# Patient Record
Sex: Male | Born: 1940 | Race: White | Hispanic: No | Marital: Married | Smoking: Never smoker
Health system: Southern US, Community
[De-identification: ages and names within clinical notes are randomized; demographics above are authoritative.]

## PROBLEM LIST (undated history)

## (undated) DIAGNOSIS — G8929 Other chronic pain: Secondary | ICD-10-CM

## (undated) DIAGNOSIS — Z8719 Personal history of other diseases of the digestive system: Secondary | ICD-10-CM

## (undated) DIAGNOSIS — C189 Malignant neoplasm of colon, unspecified: Secondary | ICD-10-CM

## (undated) DIAGNOSIS — E1122 Type 2 diabetes mellitus with diabetic chronic kidney disease: Secondary | ICD-10-CM

## (undated) DIAGNOSIS — I214 Non-ST elevation (NSTEMI) myocardial infarction: Secondary | ICD-10-CM

## (undated) DIAGNOSIS — Z992 Dependence on renal dialysis: Secondary | ICD-10-CM

## (undated) DIAGNOSIS — M545 Low back pain, unspecified: Secondary | ICD-10-CM

## (undated) DIAGNOSIS — R011 Cardiac murmur, unspecified: Secondary | ICD-10-CM

## (undated) DIAGNOSIS — I255 Ischemic cardiomyopathy: Secondary | ICD-10-CM

## (undated) DIAGNOSIS — N400 Enlarged prostate without lower urinary tract symptoms: Secondary | ICD-10-CM

## (undated) DIAGNOSIS — I779 Disorder of arteries and arterioles, unspecified: Secondary | ICD-10-CM

## (undated) DIAGNOSIS — I10 Essential (primary) hypertension: Secondary | ICD-10-CM

## (undated) DIAGNOSIS — E039 Hypothyroidism, unspecified: Secondary | ICD-10-CM

## (undated) DIAGNOSIS — Z789 Other specified health status: Secondary | ICD-10-CM

## (undated) DIAGNOSIS — I447 Left bundle-branch block, unspecified: Secondary | ICD-10-CM

## (undated) DIAGNOSIS — K279 Peptic ulcer, site unspecified, unspecified as acute or chronic, without hemorrhage or perforation: Secondary | ICD-10-CM

## (undated) DIAGNOSIS — J189 Pneumonia, unspecified organism: Secondary | ICD-10-CM

## (undated) DIAGNOSIS — N186 End stage renal disease: Secondary | ICD-10-CM

## (undated) DIAGNOSIS — I639 Cerebral infarction, unspecified: Secondary | ICD-10-CM

## (undated) DIAGNOSIS — I25810 Atherosclerosis of coronary artery bypass graft(s) without angina pectoris: Secondary | ICD-10-CM

## (undated) DIAGNOSIS — I209 Angina pectoris, unspecified: Secondary | ICD-10-CM

## (undated) DIAGNOSIS — R0600 Dyspnea, unspecified: Secondary | ICD-10-CM

## (undated) DIAGNOSIS — E119 Type 2 diabetes mellitus without complications: Secondary | ICD-10-CM

## (undated) DIAGNOSIS — F419 Anxiety disorder, unspecified: Secondary | ICD-10-CM

## (undated) DIAGNOSIS — I2510 Atherosclerotic heart disease of native coronary artery without angina pectoris: Secondary | ICD-10-CM

## (undated) DIAGNOSIS — Z9861 Coronary angioplasty status: Secondary | ICD-10-CM

## (undated) DIAGNOSIS — I2089 Other forms of angina pectoris: Secondary | ICD-10-CM

## (undated) DIAGNOSIS — C4490 Unspecified malignant neoplasm of skin, unspecified: Secondary | ICD-10-CM

## (undated) DIAGNOSIS — I208 Other forms of angina pectoris: Secondary | ICD-10-CM

## (undated) DIAGNOSIS — I739 Peripheral vascular disease, unspecified: Secondary | ICD-10-CM

## (undated) DIAGNOSIS — M1990 Unspecified osteoarthritis, unspecified site: Secondary | ICD-10-CM

## (undated) DIAGNOSIS — E785 Hyperlipidemia, unspecified: Secondary | ICD-10-CM

## (undated) DIAGNOSIS — D649 Anemia, unspecified: Secondary | ICD-10-CM

## (undated) HISTORY — DX: Atherosclerosis of coronary artery bypass graft(s) without angina pectoris: I25.810

## (undated) HISTORY — PX: APPENDECTOMY: SHX54

## (undated) HISTORY — DX: Non-ST elevation (NSTEMI) myocardial infarction: I21.4

## (undated) HISTORY — DX: Other chronic pain: G89.29

## (undated) HISTORY — DX: Coronary angioplasty status: Z98.61

## (undated) HISTORY — DX: Hyperlipidemia, unspecified: E78.5

## (undated) HISTORY — DX: Low back pain, unspecified: M54.50

## (undated) HISTORY — DX: Unspecified malignant neoplasm of skin, unspecified: C44.90

## (undated) HISTORY — PX: CATARACT EXTRACTION W/ INTRAOCULAR LENS  IMPLANT, BILATERAL: SHX1307

## (undated) HISTORY — DX: Essential (primary) hypertension: I10

## (undated) HISTORY — DX: Peptic ulcer, site unspecified, unspecified as acute or chronic, without hemorrhage or perforation: K27.9

## (undated) HISTORY — DX: Benign prostatic hyperplasia without lower urinary tract symptoms: N40.0

## (undated) HISTORY — PX: COLONOSCOPY: SHX174

## (undated) HISTORY — DX: Left bundle-branch block, unspecified: I44.7

## (undated) HISTORY — DX: End stage renal disease: N18.6

## (undated) HISTORY — DX: End stage renal disease: Z99.2

## (undated) HISTORY — PX: POSTERIOR LUMBAR FUSION: SHX6036

## (undated) HISTORY — DX: Atherosclerotic heart disease of native coronary artery without angina pectoris: I25.10

## (undated) HISTORY — DX: Low back pain: M54.5

## (undated) HISTORY — DX: Cerebral infarction, unspecified: I63.9

## (undated) HISTORY — DX: Hypothyroidism, unspecified: E03.9

## (undated) HISTORY — DX: Anemia, unspecified: D64.9

---

## 1990-08-22 HISTORY — PX: CORONARY ARTERY BYPASS GRAFT: SHX141

## 1995-04-03 HISTORY — PX: CORONARY ANGIOPLASTY: SHX604

## 1996-01-09 DIAGNOSIS — I639 Cerebral infarction, unspecified: Secondary | ICD-10-CM

## 1996-01-09 HISTORY — DX: Cerebral infarction, unspecified: I63.9

## 1998-03-16 ENCOUNTER — Ambulatory Visit (HOSPITAL_COMMUNITY): Admission: RE | Admit: 1998-03-16 | Discharge: 1998-03-16 | Payer: Self-pay | Admitting: Family Medicine

## 1999-02-09 DIAGNOSIS — C4490 Unspecified malignant neoplasm of skin, unspecified: Secondary | ICD-10-CM

## 1999-02-09 HISTORY — DX: Unspecified malignant neoplasm of skin, unspecified: C44.90

## 1999-04-13 ENCOUNTER — Encounter: Payer: Self-pay | Admitting: Nephrology

## 1999-04-13 ENCOUNTER — Encounter: Admission: RE | Admit: 1999-04-13 | Discharge: 1999-04-13 | Payer: Self-pay | Admitting: Critical Care Medicine

## 1999-06-12 ENCOUNTER — Encounter: Admission: RE | Admit: 1999-06-12 | Discharge: 1999-09-10 | Payer: Self-pay | Admitting: Nephrology

## 2000-01-30 ENCOUNTER — Inpatient Hospital Stay (HOSPITAL_COMMUNITY): Admission: AD | Admit: 2000-01-30 | Discharge: 2000-02-03 | Payer: Self-pay | Admitting: Cardiovascular Disease

## 2000-02-01 HISTORY — PX: CORONARY ANGIOPLASTY: SHX604

## 2000-02-13 ENCOUNTER — Encounter (HOSPITAL_COMMUNITY): Admission: RE | Admit: 2000-02-13 | Discharge: 2000-05-13 | Payer: Self-pay | Admitting: Cardiovascular Disease

## 2000-06-27 ENCOUNTER — Encounter: Payer: Self-pay | Admitting: Internal Medicine

## 2000-08-06 ENCOUNTER — Encounter (INDEPENDENT_AMBULATORY_CARE_PROVIDER_SITE_OTHER): Payer: Self-pay | Admitting: *Deleted

## 2000-08-06 ENCOUNTER — Encounter (INDEPENDENT_AMBULATORY_CARE_PROVIDER_SITE_OTHER): Payer: Self-pay | Admitting: Specialist

## 2000-08-06 ENCOUNTER — Ambulatory Visit (HOSPITAL_COMMUNITY): Admission: RE | Admit: 2000-08-06 | Discharge: 2000-08-06 | Payer: Self-pay | Admitting: *Deleted

## 2000-08-06 ENCOUNTER — Encounter: Payer: Self-pay | Admitting: *Deleted

## 2000-08-09 ENCOUNTER — Encounter: Payer: Self-pay | Admitting: Internal Medicine

## 2000-08-13 ENCOUNTER — Ambulatory Visit (HOSPITAL_COMMUNITY): Admission: RE | Admit: 2000-08-13 | Discharge: 2000-08-13 | Payer: Self-pay | Admitting: *Deleted

## 2000-08-13 ENCOUNTER — Encounter: Payer: Self-pay | Admitting: *Deleted

## 2000-08-14 ENCOUNTER — Encounter (INDEPENDENT_AMBULATORY_CARE_PROVIDER_SITE_OTHER): Payer: Self-pay | Admitting: *Deleted

## 2000-08-14 ENCOUNTER — Encounter: Admission: RE | Admit: 2000-08-14 | Discharge: 2000-08-14 | Payer: Self-pay | Admitting: *Deleted

## 2000-08-14 ENCOUNTER — Encounter: Payer: Self-pay | Admitting: *Deleted

## 2000-09-16 ENCOUNTER — Encounter: Payer: Self-pay | Admitting: Surgery

## 2000-09-19 ENCOUNTER — Inpatient Hospital Stay (HOSPITAL_COMMUNITY): Admission: RE | Admit: 2000-09-19 | Discharge: 2000-09-25 | Payer: Self-pay | Admitting: Surgery

## 2000-09-19 ENCOUNTER — Encounter: Payer: Self-pay | Admitting: Surgery

## 2000-09-19 ENCOUNTER — Encounter (INDEPENDENT_AMBULATORY_CARE_PROVIDER_SITE_OTHER): Payer: Self-pay | Admitting: *Deleted

## 2000-09-25 ENCOUNTER — Encounter (INDEPENDENT_AMBULATORY_CARE_PROVIDER_SITE_OTHER): Payer: Self-pay | Admitting: *Deleted

## 2001-01-08 DIAGNOSIS — C189 Malignant neoplasm of colon, unspecified: Secondary | ICD-10-CM

## 2001-01-08 HISTORY — PX: COLON RESECTION: SHX5231

## 2001-01-08 HISTORY — PX: COLON SURGERY: SHX602

## 2001-01-08 HISTORY — DX: Malignant neoplasm of colon, unspecified: C18.9

## 2001-04-28 ENCOUNTER — Encounter: Admission: RE | Admit: 2001-04-28 | Discharge: 2001-04-28 | Payer: Self-pay | Admitting: Family Medicine

## 2001-04-28 ENCOUNTER — Encounter: Payer: Self-pay | Admitting: Family Medicine

## 2001-07-14 ENCOUNTER — Encounter: Payer: Self-pay | Admitting: Internal Medicine

## 2001-07-22 ENCOUNTER — Encounter (INDEPENDENT_AMBULATORY_CARE_PROVIDER_SITE_OTHER): Payer: Self-pay | Admitting: *Deleted

## 2001-07-22 ENCOUNTER — Encounter: Admission: RE | Admit: 2001-07-22 | Discharge: 2001-07-22 | Payer: Self-pay | Admitting: *Deleted

## 2001-07-22 ENCOUNTER — Encounter: Payer: Self-pay | Admitting: *Deleted

## 2001-08-14 ENCOUNTER — Encounter (INDEPENDENT_AMBULATORY_CARE_PROVIDER_SITE_OTHER): Payer: Self-pay | Admitting: *Deleted

## 2001-08-14 ENCOUNTER — Ambulatory Visit (HOSPITAL_COMMUNITY): Admission: RE | Admit: 2001-08-14 | Discharge: 2001-08-14 | Payer: Self-pay | Admitting: *Deleted

## 2002-01-08 DIAGNOSIS — K279 Peptic ulcer, site unspecified, unspecified as acute or chronic, without hemorrhage or perforation: Secondary | ICD-10-CM

## 2002-01-08 HISTORY — DX: Peptic ulcer, site unspecified, unspecified as acute or chronic, without hemorrhage or perforation: K27.9

## 2002-05-19 ENCOUNTER — Encounter: Payer: Self-pay | Admitting: Internal Medicine

## 2002-06-24 ENCOUNTER — Ambulatory Visit (HOSPITAL_COMMUNITY): Admission: RE | Admit: 2002-06-24 | Discharge: 2002-06-24 | Payer: Self-pay | Admitting: *Deleted

## 2002-06-24 ENCOUNTER — Encounter (INDEPENDENT_AMBULATORY_CARE_PROVIDER_SITE_OTHER): Payer: Self-pay | Admitting: *Deleted

## 2002-06-24 ENCOUNTER — Encounter (INDEPENDENT_AMBULATORY_CARE_PROVIDER_SITE_OTHER): Payer: Self-pay | Admitting: Specialist

## 2002-07-21 ENCOUNTER — Encounter: Payer: Self-pay | Admitting: Internal Medicine

## 2002-09-07 ENCOUNTER — Ambulatory Visit (HOSPITAL_COMMUNITY): Admission: RE | Admit: 2002-09-07 | Discharge: 2002-09-07 | Payer: Self-pay | Admitting: *Deleted

## 2002-09-07 ENCOUNTER — Encounter (INDEPENDENT_AMBULATORY_CARE_PROVIDER_SITE_OTHER): Payer: Self-pay

## 2002-09-07 ENCOUNTER — Encounter (INDEPENDENT_AMBULATORY_CARE_PROVIDER_SITE_OTHER): Payer: Self-pay | Admitting: *Deleted

## 2002-10-12 ENCOUNTER — Encounter: Payer: Self-pay | Admitting: Internal Medicine

## 2003-02-09 ENCOUNTER — Encounter: Admission: RE | Admit: 2003-02-09 | Discharge: 2003-02-09 | Payer: Self-pay | Admitting: Family Medicine

## 2004-01-09 HISTORY — PX: CORONARY ARTERY BYPASS GRAFT: SHX141

## 2004-03-18 ENCOUNTER — Inpatient Hospital Stay (HOSPITAL_COMMUNITY): Admission: EM | Admit: 2004-03-18 | Discharge: 2004-04-03 | Payer: Self-pay | Admitting: Emergency Medicine

## 2004-03-20 ENCOUNTER — Encounter (INDEPENDENT_AMBULATORY_CARE_PROVIDER_SITE_OTHER): Payer: Self-pay | Admitting: Cardiology

## 2004-03-22 HISTORY — PX: CARDIAC CATHETERIZATION: SHX172

## 2004-05-19 ENCOUNTER — Encounter: Payer: Self-pay | Admitting: Internal Medicine

## 2004-06-14 ENCOUNTER — Ambulatory Visit (HOSPITAL_COMMUNITY): Admission: RE | Admit: 2004-06-14 | Discharge: 2004-06-14 | Payer: Self-pay | Admitting: *Deleted

## 2004-06-14 ENCOUNTER — Encounter: Payer: Self-pay | Admitting: Family Medicine

## 2004-06-14 ENCOUNTER — Encounter (INDEPENDENT_AMBULATORY_CARE_PROVIDER_SITE_OTHER): Payer: Self-pay | Admitting: *Deleted

## 2004-07-28 ENCOUNTER — Encounter: Payer: Self-pay | Admitting: Internal Medicine

## 2005-08-30 ENCOUNTER — Encounter: Payer: Self-pay | Admitting: Family Medicine

## 2006-10-28 ENCOUNTER — Inpatient Hospital Stay (HOSPITAL_COMMUNITY): Admission: RE | Admit: 2006-10-28 | Discharge: 2006-11-20 | Payer: Self-pay | Admitting: Neurosurgery

## 2006-10-28 ENCOUNTER — Ambulatory Visit: Payer: Self-pay | Admitting: Internal Medicine

## 2006-11-04 ENCOUNTER — Encounter (INDEPENDENT_AMBULATORY_CARE_PROVIDER_SITE_OTHER): Payer: Self-pay | Admitting: *Deleted

## 2006-11-12 ENCOUNTER — Encounter (INDEPENDENT_AMBULATORY_CARE_PROVIDER_SITE_OTHER): Payer: Self-pay | Admitting: *Deleted

## 2006-11-13 ENCOUNTER — Encounter (INDEPENDENT_AMBULATORY_CARE_PROVIDER_SITE_OTHER): Payer: Self-pay | Admitting: *Deleted

## 2006-11-14 ENCOUNTER — Encounter (INDEPENDENT_AMBULATORY_CARE_PROVIDER_SITE_OTHER): Payer: Self-pay | Admitting: *Deleted

## 2006-11-16 ENCOUNTER — Encounter (INDEPENDENT_AMBULATORY_CARE_PROVIDER_SITE_OTHER): Payer: Self-pay | Admitting: *Deleted

## 2006-11-20 ENCOUNTER — Encounter (INDEPENDENT_AMBULATORY_CARE_PROVIDER_SITE_OTHER): Payer: Self-pay | Admitting: *Deleted

## 2006-11-26 ENCOUNTER — Ambulatory Visit: Payer: Self-pay | Admitting: Infectious Diseases

## 2007-01-09 HISTORY — PX: CHOLECYSTECTOMY: SHX55

## 2007-01-17 ENCOUNTER — Encounter (INDEPENDENT_AMBULATORY_CARE_PROVIDER_SITE_OTHER): Payer: Self-pay | Admitting: General Surgery

## 2007-01-17 ENCOUNTER — Ambulatory Visit (HOSPITAL_COMMUNITY): Admission: RE | Admit: 2007-01-17 | Discharge: 2007-01-18 | Payer: Self-pay | Admitting: General Surgery

## 2007-01-17 ENCOUNTER — Encounter (INDEPENDENT_AMBULATORY_CARE_PROVIDER_SITE_OTHER): Payer: Self-pay | Admitting: *Deleted

## 2007-03-15 ENCOUNTER — Emergency Department (HOSPITAL_COMMUNITY): Admission: EM | Admit: 2007-03-15 | Discharge: 2007-03-15 | Payer: Self-pay | Admitting: Emergency Medicine

## 2007-06-10 ENCOUNTER — Encounter: Payer: Self-pay | Admitting: Internal Medicine

## 2007-06-20 ENCOUNTER — Ambulatory Visit (HOSPITAL_COMMUNITY): Admission: RE | Admit: 2007-06-20 | Discharge: 2007-06-20 | Payer: Self-pay | Admitting: *Deleted

## 2007-06-20 ENCOUNTER — Encounter (INDEPENDENT_AMBULATORY_CARE_PROVIDER_SITE_OTHER): Payer: Self-pay | Admitting: *Deleted

## 2007-06-25 ENCOUNTER — Encounter: Payer: Self-pay | Admitting: Family Medicine

## 2007-06-25 ENCOUNTER — Encounter (INDEPENDENT_AMBULATORY_CARE_PROVIDER_SITE_OTHER): Payer: Self-pay | Admitting: *Deleted

## 2007-06-25 LAB — CONVERTED CEMR LAB
Cholesterol: 59 mg/dL
HDL: 19 mg/dL
LDL Cholesterol: 16 mg/dL
PSA: 0.21 ng/mL
Triglycerides: 120 mg/dL

## 2007-08-01 ENCOUNTER — Encounter: Payer: Self-pay | Admitting: Internal Medicine

## 2007-11-25 ENCOUNTER — Encounter: Admission: RE | Admit: 2007-11-25 | Discharge: 2007-11-25 | Payer: Self-pay | Admitting: Neurosurgery

## 2007-11-26 ENCOUNTER — Encounter (INDEPENDENT_AMBULATORY_CARE_PROVIDER_SITE_OTHER): Payer: Self-pay | Admitting: *Deleted

## 2007-12-08 ENCOUNTER — Encounter (INDEPENDENT_AMBULATORY_CARE_PROVIDER_SITE_OTHER): Payer: Self-pay | Admitting: *Deleted

## 2007-12-08 ENCOUNTER — Ambulatory Visit: Payer: Self-pay | Admitting: Family Medicine

## 2007-12-08 DIAGNOSIS — E785 Hyperlipidemia, unspecified: Secondary | ICD-10-CM

## 2007-12-08 DIAGNOSIS — M109 Gout, unspecified: Secondary | ICD-10-CM

## 2007-12-08 DIAGNOSIS — E039 Hypothyroidism, unspecified: Secondary | ICD-10-CM

## 2007-12-08 DIAGNOSIS — I10 Essential (primary) hypertension: Secondary | ICD-10-CM

## 2007-12-08 DIAGNOSIS — Z85038 Personal history of other malignant neoplasm of large intestine: Secondary | ICD-10-CM | POA: Insufficient documentation

## 2007-12-08 DIAGNOSIS — E1140 Type 2 diabetes mellitus with diabetic neuropathy, unspecified: Secondary | ICD-10-CM

## 2007-12-08 DIAGNOSIS — Z8679 Personal history of other diseases of the circulatory system: Secondary | ICD-10-CM | POA: Insufficient documentation

## 2007-12-08 DIAGNOSIS — N189 Chronic kidney disease, unspecified: Secondary | ICD-10-CM

## 2007-12-08 DIAGNOSIS — M545 Low back pain: Secondary | ICD-10-CM

## 2007-12-11 ENCOUNTER — Encounter (INDEPENDENT_AMBULATORY_CARE_PROVIDER_SITE_OTHER): Payer: Self-pay | Admitting: *Deleted

## 2007-12-12 ENCOUNTER — Telehealth (INDEPENDENT_AMBULATORY_CARE_PROVIDER_SITE_OTHER): Payer: Self-pay | Admitting: *Deleted

## 2007-12-12 LAB — CONVERTED CEMR LAB
ALT: 10 units/L (ref 0–53)
AST: 12 units/L (ref 0–37)
Bilirubin, Direct: 0.1 mg/dL (ref 0.0–0.3)
CO2: 28 meq/L (ref 19–32)
Calcium: 9.7 mg/dL (ref 8.4–10.5)
Chloride: 105 meq/L (ref 96–112)
Glucose, Bld: 87 mg/dL (ref 70–99)
HDL: 20.4 mg/dL — ABNORMAL LOW (ref >39.0)
Sodium: 142 meq/L (ref 135–145)
Total Bilirubin: 0.5 mg/dL (ref 0.3–1.2)
Total CHOL/HDL Ratio: 4.1
Total Protein: 7.2 g/dL (ref 6.0–8.3)
Triglycerides: 145 mg/dL (ref 0–149)

## 2007-12-22 ENCOUNTER — Encounter (INDEPENDENT_AMBULATORY_CARE_PROVIDER_SITE_OTHER): Payer: Self-pay | Admitting: *Deleted

## 2008-02-27 ENCOUNTER — Encounter: Payer: Self-pay | Admitting: Family Medicine

## 2008-03-16 ENCOUNTER — Ambulatory Visit: Payer: Self-pay | Admitting: Family Medicine

## 2008-03-17 ENCOUNTER — Encounter (INDEPENDENT_AMBULATORY_CARE_PROVIDER_SITE_OTHER): Payer: Self-pay | Admitting: *Deleted

## 2008-03-17 LAB — CONVERTED CEMR LAB: Hgb A1c MFr Bld: 6.2 % — ABNORMAL HIGH (ref 4.6–6.0)

## 2008-04-05 ENCOUNTER — Encounter: Payer: Self-pay | Admitting: Family Medicine

## 2008-04-26 ENCOUNTER — Encounter: Payer: Self-pay | Admitting: Family Medicine

## 2008-06-21 ENCOUNTER — Ambulatory Visit: Payer: Self-pay | Admitting: Family Medicine

## 2008-06-23 LAB — CONVERTED CEMR LAB
AST: 15 units/L (ref 0–37)
Albumin: 4.5 g/dL (ref 3.5–5.2)
Alkaline Phosphatase: 83 units/L (ref 39–117)
BUN: 79 mg/dL — ABNORMAL HIGH (ref 6–23)
Basophils Absolute: 0 10*3/uL (ref 0.0–0.1)
Bilirubin, Direct: 0 mg/dL (ref 0.0–0.3)
CO2: 27 meq/L (ref 19–32)
Chloride: 98 meq/L (ref 96–112)
Creatinine, Ser: 2.7 mg/dL — ABNORMAL HIGH (ref 0.4–1.5)
Glucose, Bld: 93 mg/dL (ref 70–99)
HCT: 35.7 % — ABNORMAL LOW (ref 39.0–52.0)
Hemoglobin: 12 g/dL — ABNORMAL LOW (ref 13.0–17.0)
Hgb A1c MFr Bld: 6.9 % — ABNORMAL HIGH (ref 4.6–6.5)
LDL Cholesterol: 30 mg/dL (ref 0–99)
Lymphs Abs: 1.3 10*3/uL (ref 0.7–4.0)
MCV: 88.1 fL (ref 78.0–100.0)
Monocytes Absolute: 1 10*3/uL (ref 0.1–1.0)
Monocytes Relative: 11.9 % (ref 3.0–12.0)
Neutro Abs: 5.2 10*3/uL (ref 1.4–7.7)
RDW: 14.7 % — ABNORMAL HIGH (ref 11.5–14.6)
Total Bilirubin: 0.8 mg/dL (ref 0.3–1.2)
Total CHOL/HDL Ratio: 4

## 2008-07-08 ENCOUNTER — Encounter: Payer: Self-pay | Admitting: Family Medicine

## 2008-08-27 ENCOUNTER — Encounter: Payer: Self-pay | Admitting: Family Medicine

## 2008-09-01 ENCOUNTER — Encounter: Admission: RE | Admit: 2008-09-01 | Discharge: 2008-09-01 | Payer: Self-pay | Admitting: Neurosurgery

## 2008-09-08 ENCOUNTER — Encounter: Payer: Self-pay | Admitting: Family Medicine

## 2008-09-20 ENCOUNTER — Ambulatory Visit: Payer: Self-pay | Admitting: Family Medicine

## 2008-09-21 ENCOUNTER — Encounter (INDEPENDENT_AMBULATORY_CARE_PROVIDER_SITE_OTHER): Payer: Self-pay | Admitting: *Deleted

## 2008-10-25 ENCOUNTER — Encounter: Payer: Self-pay | Admitting: Family Medicine

## 2008-11-29 ENCOUNTER — Telehealth (INDEPENDENT_AMBULATORY_CARE_PROVIDER_SITE_OTHER): Payer: Self-pay | Admitting: *Deleted

## 2008-12-30 ENCOUNTER — Encounter: Payer: Self-pay | Admitting: Internal Medicine

## 2009-02-11 ENCOUNTER — Telehealth (INDEPENDENT_AMBULATORY_CARE_PROVIDER_SITE_OTHER): Payer: Self-pay | Admitting: *Deleted

## 2009-02-15 ENCOUNTER — Ambulatory Visit: Payer: Self-pay | Admitting: Family

## 2009-02-15 DIAGNOSIS — B359 Dermatophytosis, unspecified: Secondary | ICD-10-CM | POA: Insufficient documentation

## 2009-02-15 DIAGNOSIS — I5032 Chronic diastolic (congestive) heart failure: Secondary | ICD-10-CM | POA: Insufficient documentation

## 2009-02-16 ENCOUNTER — Telehealth (INDEPENDENT_AMBULATORY_CARE_PROVIDER_SITE_OTHER): Payer: Self-pay | Admitting: *Deleted

## 2009-02-18 ENCOUNTER — Telehealth (INDEPENDENT_AMBULATORY_CARE_PROVIDER_SITE_OTHER): Payer: Self-pay | Admitting: *Deleted

## 2009-02-18 ENCOUNTER — Encounter: Payer: Self-pay | Admitting: Family

## 2009-02-18 DIAGNOSIS — D509 Iron deficiency anemia, unspecified: Secondary | ICD-10-CM | POA: Insufficient documentation

## 2009-02-18 LAB — CONVERTED CEMR LAB
AST: 11 units/L (ref 0–37)
Albumin: 4.3 g/dL (ref 3.5–5.2)
Alkaline Phosphatase: 79 units/L (ref 39–117)
Basophils Relative: 0.9 % (ref 0.0–3.0)
CO2: 28 meq/L (ref 19–32)
Calcium: 9.5 mg/dL (ref 8.4–10.5)
Eosinophils Relative: 3.8 % (ref 0.0–5.0)
GFR calc non Af Amer: 23.13 mL/min (ref >60)
HDL: 25.4 mg/dL — ABNORMAL LOW (ref >39.00)
Hemoglobin: 11.3 g/dL — ABNORMAL LOW (ref 13.0–17.0)
Lymphocytes Relative: 6.6 % — ABNORMAL LOW (ref 12.0–46.0)
Microalb Creat Ratio: 72.2 mg/g — ABNORMAL HIGH (ref 0.0–30.0)
Monocytes Relative: 6.9 % (ref 3.0–12.0)
Neutro Abs: 10.4 10*3/uL — ABNORMAL HIGH (ref 1.4–7.7)
RBC: 3.87 M/uL — ABNORMAL LOW (ref 4.22–5.81)
Sodium: 141 meq/L (ref 135–145)
Total CHOL/HDL Ratio: 3
Total Protein: 7.7 g/dL (ref 6.0–8.3)
VLDL: 22.8 mg/dL (ref 0.0–40.0)

## 2009-02-23 ENCOUNTER — Encounter: Payer: Self-pay | Admitting: Family Medicine

## 2009-02-24 ENCOUNTER — Encounter (INDEPENDENT_AMBULATORY_CARE_PROVIDER_SITE_OTHER): Payer: Self-pay | Admitting: *Deleted

## 2009-03-28 ENCOUNTER — Encounter: Payer: Self-pay | Admitting: Family Medicine

## 2009-04-04 ENCOUNTER — Encounter: Payer: Self-pay | Admitting: Family Medicine

## 2009-04-14 ENCOUNTER — Ambulatory Visit: Payer: Self-pay | Admitting: Family Medicine

## 2009-04-14 DIAGNOSIS — R21 Rash and other nonspecific skin eruption: Secondary | ICD-10-CM | POA: Insufficient documentation

## 2009-05-08 HISTORY — PX: AV FISTULA REPAIR: SHX563

## 2009-05-10 ENCOUNTER — Ambulatory Visit: Payer: Self-pay | Admitting: Vascular Surgery

## 2009-05-13 ENCOUNTER — Encounter: Payer: Self-pay | Admitting: Family Medicine

## 2009-05-18 ENCOUNTER — Ambulatory Visit (HOSPITAL_COMMUNITY): Admission: RE | Admit: 2009-05-18 | Discharge: 2009-05-18 | Payer: Self-pay | Admitting: Vascular Surgery

## 2009-05-18 ENCOUNTER — Ambulatory Visit: Payer: Self-pay | Admitting: Vascular Surgery

## 2009-06-08 ENCOUNTER — Encounter: Payer: Self-pay | Admitting: Family Medicine

## 2009-06-13 ENCOUNTER — Ambulatory Visit: Payer: Self-pay | Admitting: Family Medicine

## 2009-06-13 ENCOUNTER — Encounter (INDEPENDENT_AMBULATORY_CARE_PROVIDER_SITE_OTHER): Payer: Self-pay | Admitting: *Deleted

## 2009-06-13 LAB — CONVERTED CEMR LAB
CO2: 26 meq/L (ref 19–32)
GFR calc non Af Amer: 20.12 mL/min (ref >60)
Glucose, Bld: 82 mg/dL (ref 70–99)
Potassium: 4.7 meq/L (ref 3.5–5.1)
Sodium: 144 meq/L (ref 135–145)

## 2009-07-05 ENCOUNTER — Encounter: Payer: Self-pay | Admitting: Family Medicine

## 2009-07-19 ENCOUNTER — Ambulatory Visit: Payer: Self-pay | Admitting: Vascular Surgery

## 2009-08-03 DIAGNOSIS — Z8719 Personal history of other diseases of the digestive system: Secondary | ICD-10-CM | POA: Insufficient documentation

## 2009-08-03 DIAGNOSIS — K449 Diaphragmatic hernia without obstruction or gangrene: Secondary | ICD-10-CM | POA: Insufficient documentation

## 2009-08-17 ENCOUNTER — Ambulatory Visit: Payer: Self-pay | Admitting: Internal Medicine

## 2009-08-17 DIAGNOSIS — R197 Diarrhea, unspecified: Secondary | ICD-10-CM

## 2009-09-05 ENCOUNTER — Telehealth (INDEPENDENT_AMBULATORY_CARE_PROVIDER_SITE_OTHER): Payer: Self-pay | Admitting: *Deleted

## 2009-09-16 ENCOUNTER — Ambulatory Visit: Payer: Self-pay | Admitting: Family Medicine

## 2009-09-16 DIAGNOSIS — R1011 Right upper quadrant pain: Secondary | ICD-10-CM

## 2009-09-19 LAB — CONVERTED CEMR LAB
ALT: 8 units/L (ref 0–53)
AST: 13 units/L (ref 0–37)
Alkaline Phosphatase: 75 units/L (ref 39–117)
BUN: 70 mg/dL — ABNORMAL HIGH (ref 6–23)
Basophils Absolute: 0 10*3/uL (ref 0.0–0.1)
Chloride: 101 meq/L (ref 96–112)
Eosinophils Absolute: 0.4 10*3/uL (ref 0.0–0.7)
Eosinophils Relative: 2.6 % (ref 0.0–5.0)
Hgb A1c MFr Bld: 6.5 % (ref 4.6–6.5)
MCHC: 33.3 g/dL (ref 30.0–36.0)
MCV: 88.2 fL (ref 78.0–100.0)
Monocytes Absolute: 0.8 10*3/uL (ref 0.1–1.0)
Neutrophils Relative %: 85.9 % — ABNORMAL HIGH (ref 43.0–77.0)
Platelets: 330 10*3/uL (ref 150.0–400.0)
Potassium: 5 meq/L (ref 3.5–5.1)
RDW: 16.8 % — ABNORMAL HIGH (ref 11.5–14.6)
Total Bilirubin: 0.5 mg/dL (ref 0.3–1.2)
WBC: 15.1 10*3/uL — ABNORMAL HIGH (ref 4.5–10.5)

## 2009-09-22 ENCOUNTER — Ambulatory Visit: Payer: Self-pay | Admitting: Family Medicine

## 2009-09-23 LAB — CONVERTED CEMR LAB
Basophils Absolute: 0.1 10*3/uL (ref 0.0–0.1)
Eosinophils Relative: 5 % (ref 0.0–5.0)
Monocytes Relative: 7 % (ref 3.0–12.0)
Neutrophils Relative %: 75.8 % (ref 43.0–77.0)
Platelets: 286 10*3/uL (ref 150.0–400.0)
RDW: 16.7 % — ABNORMAL HIGH (ref 11.5–14.6)
WBC: 8.9 10*3/uL (ref 4.5–10.5)

## 2009-10-06 ENCOUNTER — Encounter: Payer: Self-pay | Admitting: Family Medicine

## 2009-11-16 ENCOUNTER — Encounter: Payer: Self-pay | Admitting: Family Medicine

## 2009-12-08 ENCOUNTER — Encounter: Payer: Self-pay | Admitting: Family Medicine

## 2009-12-16 ENCOUNTER — Ambulatory Visit: Payer: Self-pay | Admitting: Family Medicine

## 2009-12-19 ENCOUNTER — Telehealth (INDEPENDENT_AMBULATORY_CARE_PROVIDER_SITE_OTHER): Payer: Self-pay | Admitting: *Deleted

## 2009-12-19 LAB — CONVERTED CEMR LAB
AST: 13 units/L (ref 0–37)
Albumin: 4.3 g/dL (ref 3.5–5.2)
CO2: 26 meq/L (ref 19–32)
Calcium: 9.5 mg/dL (ref 8.4–10.5)
Chloride: 104 meq/L (ref 96–112)
Cholesterol: 78 mg/dL (ref 0–200)
HDL: 19.3 mg/dL — ABNORMAL LOW (ref >39.00)
Hgb A1c MFr Bld: 6.2 % (ref 4.6–6.5)
LDL Cholesterol: 48 mg/dL (ref 0–99)
Sodium: 142 meq/L (ref 135–145)
TSH: 0.76 microintl units/mL (ref 0.35–5.50)
Total CHOL/HDL Ratio: 4
Total Protein: 6.8 g/dL (ref 6.0–8.3)
Triglycerides: 55 mg/dL (ref 0.0–149.0)

## 2009-12-27 ENCOUNTER — Ambulatory Visit: Payer: Self-pay | Admitting: Family Medicine

## 2009-12-29 LAB — CONVERTED CEMR LAB
BUN: 52 mg/dL — ABNORMAL HIGH (ref 6–23)
Chloride: 104 meq/L (ref 96–112)
Creatinine, Ser: 2.5 mg/dL — ABNORMAL HIGH (ref 0.4–1.5)
GFR calc non Af Amer: 26.89 mL/min — ABNORMAL LOW (ref >60.00)

## 2010-01-11 ENCOUNTER — Encounter: Payer: Self-pay | Admitting: Family Medicine

## 2010-02-07 NOTE — Consult Note (Signed)
Summary: Diarrhea  NAME:  [name] [name], [name] [name]                ACCOUNT NO.:  [id number]      MEDICAL RECORD NO.:  [id number]          PATIENT TYPE:  INP      LOCATION:  [id number]                         FACILITY:  [facility]      PHYSICIAN:  Marlene Bast, MDDATE OF BIRTH:  29-Jun-1940      DATE OF CONSULTATION:   DATE OF DISCHARGE:                                    CONSULTATION      REASON FOR CONSULTATION:  Altered level of consciousness and diarrhea.      ASSESSMENT:   1. Delirium with extreme lethargy and hallucinations last night.  This       is postop day number 7 for this patient, who was doing quite well       first 3 days.  He was ambulating in the halls, but 4 days ago he       became significantly drowsy.  Now he falls asleep after each       sentence.  Even with family in the room, he cannot seem to stay       awake.  He has increased abdominal distention on exam.  He is not       eating much.  He has also been having loose stools.  The patient       tells me that last night he had an episode where he hallucinated,       but this was a vague episode where he was watching the TV, then       turned the TV off and continued to see unusual things, was not       more specific.   2. He has chronic renal insufficiency with a creatinine of       approximately 2.5.   3. He had hyperkalemia earlier in the hospital stay, but this       resolved.   4. History of hypothyroidism.   5. History of colon cancer.   6. Diabetes mellitus type 2.   7. History of a stroke.   8. Ileus.   9. Coronary artery disease.   10.Anemia.   11.Hypoparathyroidism.   12.Gout.   13.He has stage III chronic renal insufficiency or chronic kidney       disease.      MEDICATIONS:  At the time of my evaluation include:   1. Coreg 25 mg p.o. b.i.d.   2. Vasotec 20 mg p.o.  b.i.d.   3. Lasix 80 mg p.o. b.i.d.   4. Lactated Ringer's 50 mL an hour.   5. Synthroid 125 mcg p.o. daily.   6. Hytrin 2 mg p.o. daily.   And a few p.r.n. medications.      OBJECTIVE:  T-max is 98.7.  Pulse 69.  Respiratory rate 118.  Blood   pressure 110/57.  O2 saturations 97% on 4 liters nasal cannula.  Blood   sugars have been 169.  T 196 today.      SOCIAL HISTORY:  Noncontributory.      REVIEW OF SYSTEMS:  Deferred because of the  patient's drowsiness.  He   and the family were able to tell me about his symptoms in the last   couple of days as mentioned above.      PHYSICAL EXAMINATION:  GENERAL:  The patient has his eyes closed.  He is   in no acute distress.   HEENT EXAM:  Normocephalic, atraumatic.  Pupils are pinpoint and round.   His sclerae nonicteric.  Oral mucosa moist.   NECK:  Supple.  No lymphadenopathy.  No thyromegaly.  No jugular venous   distention.   CARDIAC EXAM:  Regular rate and rhythm.   LUNGS:  Clear to auscultation laterally with no wheezes, rhonchi or   rales.   ABDOMEN:  Tight like a basketball.  He has high-pitched tinkling bowel   sounds.   EXTREMITIES:  Reveal no evidence of clubbing, cyanosis or pitting edema.   NEUROLOGICALLY:  He is drowsy, but will wake up and answer questions   appropriately.  When he wakes up, he is alert oriented.  He moves each   of his extremities to command.      DATA:  None at this time.      RECOMMENDATIONS:   1. Delirium.  The cause of the delirium is not overtly clear.  We will       initiate a workup that includes:       a.     Holding Percocet, valium, Ambien and any sedating        medications, though he has been getting these very rarely.       b.     Check a head CT.       c.     We will check labs to include a CPK, troponin, repeated his        kidney function, electrolytes, calcium and LFTs.       d.     We will check an EGD to rule out hypercarbia.  The patient        has no wheezing or lung history.       e.     The patient does not have any clear signs of infection.  He        had a UA done earlier today, which showed a little blood, but  no        clear WBCs.  He is afebrile, and he had a normal white count        yesterday, so we will continue to monitor for signs of sepsis, but        there are none present at this time.       f.     We will also work on resolving his ileus, perhaps that has        something to do with his diminished level of consciousness.   2. Ileus.  We will stop the Dulcolax and Colace for now and put in an       NG tube to suction.  The nurse tells me the ileus has been going on       for at least 3 days now and has not improved at all, and I suspect       that the diarrhea that he is having is likely secondary to this,       though C. diff cultures have been sent.   3. A history of hypothyroidism.  The patient is on Synthroid.  We will       check a  TSH as well.   4. Diabetes mellitus.  We will continue current measures.  He is on       sliding scale at this time, but actually do not see any long acting       medication for diabetes.   5. For his known chronic kidney disease, we will just keep an eye on       this.  The patient did have an episode of hyperkalemia earlier in       the hospital stay.   6. Known coronary artery disease.  The patient did have a negative       stress test prior to his surgery and does have a history of remote       bypass surgery.  We will check a CPK and troponin during his       workup.  He denies any chest pains.  So, the etiology of the       patient's delirium at this time remains a mystery, but we will       proceed with a workup, and I did discuss this with the family.       They are understandably quite concerned.  They do not feel       that the Percocet is the cause of delirium, nor do they think it       has anything to do with  his ileus, which these may very well be       true, but we will continue the workup as mentioned above.      Thanks very much for the consultation.               Marlene Bast, MD   Electronically Signed             CVC/MEDQ  D:  11/04/2006  T:  11/04/2006  Job:  [id number]      cc:   Vickki Hearing, M.D.

## 2010-02-07 NOTE — Op Note (Signed)
Summary: CHOLECYSTECTOMY  NAME:  [name] [name] NO.:  [id number]      MEDICAL RECORD NO.:  [id number]          PATIENT TYPE:  OIB      LOCATION:  [id number]                         FACILITY:  [facility]      PHYSICIAN:  Marland Kitchen T. [name], HortonDATE OF BIRTH:  11/11/1940      DATE OF PROCEDURE:  01/17/2007   DATE OF DISCHARGE:                                  OPERATIVE REPORT      PREOPERATIVE DIAGNOSIS:  Severe cholecystitis.      POSTOPERATIVE DIAGNOSIS:  Severe cholecystitis.      SURGICAL PROCEDURES:   1. Laparoscopic cholecystectomy.   2. Intraoperative cholangiogram.      SURGEON:  Marland Kitchen T. [name], M.D.      ASSISTANT:  Earnstine Regal, M.D.      ANESTHESIA:  General.      BRIEF HISTORY:  [name] [name] is a 70 year old male who approximately   8 weeks ago developed severe acute cholecystitis in the early   postoperative period following surgery for spinal stenosis.  He   underwent percutaneous cholecystostomy at that time, as he was having   respiratory failure and was actually on the ventilator for short time.   He, however, improved, was discharged home and his drain has remained in   place.  Gallbladder ultrasound showing sludge and thickened gallbladder   wall.  He continues to have some mild right upper quadrant discomfort.   He is now stabilized to his usual state of health and we have   recommended proceeding with laparoscopic cholecystectomy with   cholangiogram.  The nature of the procedure, indications, risks of   bleeding, infection, bile leak, bowel injury, possible need for open   procedure were discussed and understood.  He is now brought to the   operating room for this procedure.      DESCRIPTION OF OPERATION:  The patient was brought tolerated the   operating room and placed in supine position on the operating table and   general endotracheal anesthesia was induced.  The percutaneous tube was   cut and then removed intact.  The  abdomen was widely sterilely prepped   and draped.  Correct patient and procedure were verified.  He received   preoperative antibiotics.  Local anesthesia was used to infiltrate the   trocar sites prior to the incisions.  A 1-cm incision was made below the   umbilicus and dissection carried down to the midline fascia.  This was   opened under direct vision for about a centimeter and careful dissection   was used to enter the peritoneal cavity, as the patient did have a   previous midline incision from colectomy.  However, preperitoneal cavity   was entered without difficulty and through a mattress suture of Vicryl,   the Hasson trocar was placed and pneumoperitoneum established.  There   were no significant adhesions in the right side of the abdomen.  Under   direct vision, a 10-mm trocar was placed in the subxiphoid area and two   5-mm trocars along the  right subcostal margin.  Omental adhesions were   taken down off the gallbladder under direct vision.  The gallbladder was   contracted and had very significant chronic inflammatory change.  The   fundus was able to be exposed and elevated and further fibrofatty tissue   carefully dissected down off the gallbladder, working toward the porta   hepatis.  The fundus was exposed and grasped and retracted   inferolaterally.  The anatomy was a little difficult due to the   contracted gallbladder and chronic inflammation.  We identified what   appeared to be tapering of the distal gallbladder; at this point,   peritoneum was incised around the gallbladder anteriorly and posteriorly   and fibrofatty tissue was stripped down off the gallbladder toward the   porta hepatis.  The dissection continued very carefully and slowly and   we were able to define the gallbladder tapering down to what appeared to   be the cystic duct.  This was encircled, dissected over about a   centimeter and clipped.  This was opened and was the cystic duct and an    operative cholangiogram was then obtained through this; this showed a   fairly long remaining cystic duct and normal common bile and   intrahepatic ducts with free flow into the duodenum and no filling   defects.  Following this, the cholangiocath was removed and the cystic   duct was doubly clipped proximally and divided.  The cystic artery was   clearly seen at this point in Calot's triangle, coursing up on the   gallbladder wall and it was divided between 2 proximal and distal clips.   The gallbladder was then dissected free from its bed using hook cautery.   There was severe chronic inflammation and dissection was rather tedious,   but progressed satisfactorily in the gallbladder was placed in the   EndoCatch bag and removed through the umbilicus.  Complete hemostasis   was obtained in the gallbladder bed with cautery and a Surgicel pack.   The right upper quadrant was thoroughly irrigated.  Trocars were removed   under direct vision and all CO2 evacuated.  The mattress suture was   secured at the umbilicus.  Skin incisions were closed with interrupted   subcuticular 4-0 Monocryl and Dermabond.  The patient taken to Recovery   in good condition.               [name] [name], M.D.   Electronically Signed            BTH/MEDQ  D:  01/17/2007  T:  01/18/2007  Job:  [id number]

## 2010-02-07 NOTE — Discharge Summary (Signed)
Summary: Adenocarcinoma of Colon                    [name] [name]. [facility]  Patient:    [name] [name], [name] [name] Visit Number: [id number] MRN: [id number]          Service Type: SUR Location: [id number] Attending Physician:  [name] [name] Dictated by:   [name] [name], M.D. Admit Date:  09/19/2000 Discharge Date: 09/25/2000   CC:         [name] [name], M.D.  Dr. Mcarthur [name], M.D.  [name] [name], M.D.   Discharge Summary  OFFICE MEDICAL RECORD # [id number]  FINAL DIAGNOSES: 1. 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 2. Chronic renal failure. 3. Insulin-dependent diabetes. 4. Coronary artery disease.  CLINICAL HISTORY:  [name] is a 70 year old gentleman who recently had a colonoscopy.  Was found to have carcinoma in a polyp which was in the mid transverse colon, although initially identified as descending colon.  After a cardiac evaluation to be sure he was stable for surgery, he completed a bowel prep at home and was brought into the hospital for surgical intervention.  HOSPITAL COURSE:  Patient was admitted and taken to the operating room where a distal transverse proximal descending colon resection with primary anastomosis was performed.  He tolerated the procedure fairly well.  He was seen in consultation both by renal and medical services.  His diabetes was managed with sliding scale.  He was replaced on some of his cardiac medications. Initially his potassium elevated, but then came back down to normal.  His creatinine did bump up to 3.5 and then came back down to 1.5 by discharge. His hemoglobin postoperative remained stable.  He was maintained n.p.o. postoperatively.  He was begun on aspirin because of a prior history of strokes.  He appeared stable, but was maintained on a monitor for a couple of days.  He continued to improve regarding his renal status.  By the third postoperative day he had a benign abdomen so no diet  was started.  By the fourth postoperative day he was passing a little bit of gas and had begun on a liquid diet.  This was tolerated nicely and diet was then advanced to solids on September 17.  He continued to tolerate those.  He was restarted on his Coumadin.  His blood pressure was managed, put back on his usual medications.  Awaiting for his protime to prolong, he was placed on some Lovenox.  By September 18 he was tolerating his diet.  He was stable.  He did have a little mild hypokalemia to 3.5, not thought significant, although he was given some supplemental p.o. potassium.  His diabetes had remained stable. His abdomen was healing nicely and alternate staples were removed.  The patient was discharged in satisfactory condition.  Tylox for pain. Limited activities.  Resume his diet as tolerated.  Go back on his usual home insulin with appropriate monitoring for glucose levels.  Restart on most of his cardiac medications per cardiology.  He is to follow-up with me in a few days and then in a couple of weeks with both renal and cardiology.  PATHOLOGY:  No residual cancer indicating that the tumor had been completely removed.  LABORATORIES:  Hemoglobin the day prior to discharge was 13.9 with white count 10,200.  Protime was up to 18.2 with an INR of 1.7 the day prior to discharge and was continued on his anticoagulation. Dictated  by:   [name] [name], M.D. Attending Physician:  [name] [name] DD:  09/25/00 TD:  09/25/00 Job: [id number]

## 2010-02-07 NOTE — Letter (Signed)
Summary: [facility]   Imported By: Phillis Knack 08/03/2009 14:38:34  _____________________________________________________________________  External Attachment:    Type:   Image     Comment:   External Document

## 2010-02-07 NOTE — Op Note (Signed)
Summary: ENDOSCOPY/PATH    NAME:  [name] [name], [name] [name] NO.:  [id number]   MEDICAL RECORD NO.:  [id number]                   PATIENT TYPE:  AMB   LOCATION:  ENDO                                 FACILITY:  [facility]   PHYSICIAN:  Waverly Ferrari, M.D.                 DATE OF BIRTH:  1940-02-22   DATE OF PROCEDURE:  DATE OF DISCHARGE:                                 OPERATIVE REPORT   PROCEDURE:  Upper endoscopy with biopsy.   INDICATIONS:  Weight loss, hemoccult positivity.   ANESTHESIA:  Demerol 90 mg, Versed 8 mg.   DESCRIPTION OF PROCEDURE:  With the patient mildly sedated in the left  lateral decubitus position, the Olympus videoscopic endoscope was inserted  in the mouth and passed under direct vision through the esophagus, which  appeared normal until it reached the distal esophagus and there was a  questionable area of Barrett's with an island reddened mucosa separated by a  very thin line of normal esophageal mucosa from above the Z-line.  This was  photographed.  We entered into the stomach and a punctate ulcer with a round  smooth border and surrounding erythema was noted on one of the folds in the  proximal stomach.  This was approximately 5 mm in size and had a black  eschar base and there were some blood flecks in the stomach indicating this  may, in fact, be where the hemoccult positivity was emanating from.  This  was photographed and biopsied around its perimeter.  The endoscope was then  advanced to the remainder of the stomach.  The fundus, body, duodenal bulb,  and second portion of the duodenum appeared relatively normal.  From this  point, the endoscope was slowly withdrawn taking circumferential views of  the duodenal mucosa until the endoscope then pulled back into the stomach,  placed in retroflexion and viewed the stomach from below.  The endoscope was  then straightened, withdrawn, taking circumferential views of the remaining  gastric  and esophageal mucosa, stopping in the distal esophagus to biopsy  the areas of  reported  Barrett's.  The patient's vital signs and pulse  oximetry remained stable.  The patient tolerated the procedure well without  apparent complication.   FINDINGS:  Gastric ulcer on the folds, small punched out lesion with black  eschar base as well as some heme seen in the area indicating this could very  well be the cause of the patient's hemoccult-positivity.  This was biopsied  along with what appears to be possibly an island of Barrett's esophagus,  biopsied.   PLAN:  Await biopsy reports.  The patient will call me for results and  follow up with me as an outpatient.  Will proceed to colonoscopy as planned.  Waverly Ferrari, M.D.   GMO/MEDQ  D:  2020/12/302  T:  2020/12/302  Job:  [id number]   cc:   Jeneen Rinks L. Deterding, M.D.  [place] [address]  Fax: [phone]   FINAL DIAGNOSIS    ***MICROSCOPIC EXAMINATION AND DIAGNOSIS***    1. ESOPHAGUS, BIOPSIES: GASTROESOPHAGEAL JUNCTION MUCOSA WITH   MILD INFLAMMATION CONSISTENT WITH GASTROESOPHAGEAL REFLUX. NO   INTESTINAL METAPLASIA, DYSPLASIA OR MALIGNANCY IDENTIFIED.    2. CHRONIC GASTRITIS WITH FOCAL EROSION. NO DYSPLASIA OR   MALIGNANCY IDENTIFIED    3. COLON, BIOPSY: BENIGN COLONIC MUCOSA. NO SIGNIFICANT   INFLAMMATION OR OTHER ABNORMALITIES IDENTIFIED.    COMMENT   1. An Alcian Blue stain is performed to determine the presence   of intestinal metaplasia. No intestinal metaplasia is identified   with the Alcian Blue stain. The control stained appropriately.    2. A Warthin-Starry stain is performed to determine the   possibility of the presence of Helicobacter pylori. The   Warthin-Starry stain is negative for organisms of Helicobacter   pylori. The control(s) stained appropriately.    3. There is colonic mucosa with normal crypt architecture and no   objective increase in  inflammation. No active inflammation,   microscopic colitis, collagenous colitis or significant chronic   changes identified. No hyperplastic or adenomatous changes are   seen, and there is no evidence of malignancy. (JDP:caf 06/25/02)    cf   Date Reported: 06/29/2002 Chrystie Nose. Saralyn Pilar, MD   *** Electronically Signed Out By JDP ***    Clinical information   Diarrhea (cf)    specimen(s) obtained   1: Esophagus, biopsy, distal   2: Colon, biopsy, slpenic ulcer   3: Colon, biopsy, random    Gross Description   1. Received in formalin are tan, soft tissue fragments that are   submitted in toto. Number: 2   Size: 0.2 and 0.3 cm    2. Received in formalin are tan, soft tissue fragments that are   submitted in toto. Number: 2   Size: each 0.3 cm    3. Received in formalin are tan, soft tissue fragments that are   submitted in toto. Number: 4   Size: 0.2 to 0.3 cm GP:mw 6-16    mw/

## 2010-02-07 NOTE — Assessment & Plan Note (Signed)
Summary: DUE FOR OV/KDC   Vital Signs:  Patient profile:   70 year old male Weight:      201 pounds Temp:     97.4 degrees F oral Pulse rate:   62 / minute BP sitting:   130 / 56  (right arm)  Vitals Entered By: Rolla Flatten CMA (February 15, 2009 10:29 AM) CC: f/u DM, fasting Comments REVIEWED MED LIST, PATIENT AGREED DOSE AND INSTRUCTION CORRECT    CC:  f/u DM and fasting.  History of Present Illness: [name] [name] is a 70 year old male who presents for follow up of his diabetes.  Fasting sugars 86-110,  notes that he checks again in the afternoon often readings are over 200.  Rarely checks sugars before bed time- notes usually high 100's at that time.  Patient notes that he is using novolin N50 units subcutaneously two times a day Podiatry- has never seen a podiatrist.    Last eye exam was 3 months ago- was told "I have diabetes in my eyes"  Was seen at [facility] and saw opthalmologist.  Has follow up in 38months.  Notes that he had laser treatment 1 year ago.     Allergies (verified): No Known Drug Allergies  Review of Systems       c/o rash on his legs, notes chronic lower extremity edema which he tells me is at baseline.  Physical Exam  General:  Well-developed,well-nourished,in no acute distress; alert,appropriate and cooperative throughout examination Head:  Normocephalic and atraumatic without obvious abnormalities.  balding. Eyes:  PERRLA Ears:  External ear exam shows no significant lesions or deformities.  Otoscopic examination reveals clear canals, tympanic membranes are intact bilaterally without bulging, retraction, inflammation or discharge. Hearing is grossly normal bilaterally. Lungs:  Normal respiratory effort, chest expands symmetrically. Lungs are clear to auscultation, no crackles or wheezes. Heart:  + grade 2-3 systolic murmur Extremities:  2-3+ bilateral lower extremity edema + rough raised hyperpigmented patch about 1" in diameter on left  calf   Impression & Recommendations:  Problem # 1:  DIABETES MELLITUS, TYPE II (ICD-250.00) Assessment Deteriorated  Patient tells me that he often "skips" am dose of insulin if sugar is "low" ie (80's).  Subsequently has high sugars in high 200's in the afternoon.  I instructed patient to take 1/2 dose of novolin  in these instances and not to skip all together,  I suspect that he may need a sliding scale as well with short acting insulin.  Will check A1C- previous A1c was 6.4.   His updated medication list for this problem includes:    Enalapril Maleate 20 Mg Tabs (Enalapril maleate) .Marland Kitchen... Take one tablet two times a day    Aspirin Childrens 81 Mg Chew (Aspirin) .Marland Kitchen... Take one tablet daily    Novolin N 100 Unit/ml Susp (Insulin isophane human) .Marland KitchenMarland KitchenMarland KitchenMarland Kitchen 50 units two times a day  Orders: TLB-BMP (Basic Metabolic Panel-BMET) (99991111) TLB-CBC Platelet - w/Differential (85025-CBCD) TLB-Microalbumin/Creat Ratio, Urine (82043-MALB) T-Hgb A1C [id number]) Podiatry Referral (Podiatry)  Labs Reviewed: Creat: 2.7 (06/21/2008)     Last Eye Exam: diabetic retinopathy (05/10/2008) Reviewed HgBA1c results: 6.4 (09/20/2008)  6.9 (06/21/2008)  Problem # 2:  CHF (ICD-428.0) Assessment: Unchanged Patient sees Dr. Rollene Fare,  notes that his edema is a baseline. + murmur noted today on exam, Review of 2-D echo from 2008 notes + history of mitral valve stenosis.   His updated medication list for this problem includes:    Plavix 75 Mg Tabs (Clopidogrel  bisulfate) .Marland Kitchen... Take one tablet daily    Carvedilol 25 Mg Tabs (Carvedilol) .Marland Kitchen... Take one tablet two times a day    Enalapril Maleate 20 Mg Tabs (Enalapril maleate) .Marland Kitchen... Take one tablet two times a day    Furosemide 80 Mg Tabs (Furosemide) .Marland Kitchen... Take one tablet three times a day    Aspirin Childrens 81 Mg Chew (Aspirin) .Marland Kitchen... Take one tablet daily  Problem # 3:  RINGWORM (ICD-110.9) Assessment: New Trial of lotrimin ultra  Problem # 4:   HYPERTENSION (ICD-401.9) Assessment: Comment Only BP control was better last visit, but still reasonable, will continue current meds- monitor.   His updated medication list for this problem includes:    Carvedilol 25 Mg Tabs (Carvedilol) .Marland Kitchen... Take one tablet two times a day    Enalapril Maleate 20 Mg Tabs (Enalapril maleate) .Marland Kitchen... Take one tablet two times a day    Furosemide 80 Mg Tabs (Furosemide) .Marland Kitchen... Take one tablet three times a day    Terazosin Hcl 2 Mg Caps (Terazosin hcl) .Marland Kitchen... Take one tablet daily    Amlodipine Besylate 10 Mg Tabs (Amlodipine besylate) .Marland Kitchen... Take one tablet daily  Orders: Prescription Created Electronically [phone])  BP today: 130/56 Prior BP: 110/50 (09/20/2008)  Labs Reviewed: K+: 4.8 (06/21/2008) Creat: : 2.7 (06/21/2008)   Chol: 75 (06/21/2008)   HDL: 21.30 (06/21/2008)   LDL: 30 (06/21/2008)   TG: 118.0 (06/21/2008)  Complete Medication List: 1)  Plavix 75 Mg Tabs (Clopidogrel bisulfate) .... Take one tablet daily 2)  Carvedilol 25 Mg Tabs (Carvedilol) .... Take one tablet two times a day 3)  Enalapril Maleate 20 Mg Tabs (Enalapril maleate) .... Take one tablet two times a day 4)  Synthroid 125 Mcg Tabs (Levothyroxine sodium) .... Take one tablet daily 5)  Allopurinol 300 Mg Tabs (Allopurinol) .... Take one tablet daily 6)  Furosemide 80 Mg Tabs (Furosemide) .... Take one tablet three times a day 7)  Terazosin Hcl 2 Mg Caps (Terazosin hcl) .... Take one tablet daily 8)  Amlodipine Besylate 10 Mg Tabs (Amlodipine besylate) .... Take one tablet daily 9)  Sodium Bicarbonate 650 Mg Tabs (Sodium bicarbonate) .... Take two tablet two times a day 10)  Aspirin Childrens 81 Mg Chew (Aspirin) .... Take one tablet daily 11)  Novolin N 100 Unit/ml Susp (Insulin isophane human) .... 50 units two times a day 12)  Temazepam 15 Mg Caps (Temazepam) .... Take one tablet daily as needed 13)  Nitroglycerin 0.4 Mg Subl (Nitroglycerin) .... Take 1 tab sl as needed chest  pain 14)  Vytorin 10-40 Mg Tabs (Ezetimibe-simvastatin) .... 1/2 by mouth daily 15)  Vitamin D3 1000 Unit Caps (Cholecalciferol) .... Take 1 tab once daily 16)  Lotrimin Ultra 1 % Crea (Butenafine hcl) .... Apply twice daily for 2-3 weeks to affected area until healed  Other Orders: Venipuncture [id number]) TLB-Lipid Panel (80061-LIPID) TLB-Hepatic/Liver Function Pnl (80076-HEPATIC) TLB-TSH (Thyroid Stimulating Hormone) [id number])  Patient Instructions: 1)  Please schedule a follow-up appointment in 3 months. 2)  You will be called about your appointment with podiatry. Prescriptions: PLAVIX 75 MG TABS (CLOPIDOGREL BISULFATE) take one tablet daily  #90 x 1   Entered and Authorized by:   Nance Pear FNP   Signed by:   Nance Pear FNP on 02/15/2009   Method used:   Faxed to ...       [facility]* Probation officer)             ,  Ph: [phone]       Fax: [phone]   RxIDEN:[id number] AMLODIPINE BESYLATE 10 MG TABS (AMLODIPINE BESYLATE) take one tablet daily  #90 x 1   Entered and Authorized by:   Nance Pear FNP   Signed by:   Nance Pear FNP on 02/15/2009   Method used:   Faxed to ...       [facility]* (mail-order)             ,          Ph: [phone]       Fax: [phone]   RxIDAL:[id number] CARVEDILOL 25 MG TABS (CARVEDILOL) take one tablet two times a day  #180 x 1   Entered and Authorized by:   Nance Pear FNP   Signed by:   Nance Pear FNP on 02/15/2009   Method used:   Faxed to ...       [facility]* (mail-order)             ,          Ph: [phone]       Fax: [phone]   RxID:   [id number] ALLOPURINOL 300 MG TABS (ALLOPURINOL) take one tablet daily  #90 x 1   Entered and Authorized by:   Nance Pear FNP   Signed by:   Nance Pear FNP on 02/15/2009   Method used:   Faxed to ...       [facility]* (mail-order)             ,          Ph:  [phone]       Fax: [phone]   RxIDKY:[id number] TERAZOSIN HCL 2 MG CAPS (TERAZOSIN HCL) take one tablet daily  #180 x 1   Entered and Authorized by:   Nance Pear FNP   Signed by:   Nance Pear FNP on 02/15/2009   Method used:   Faxed to ...       [facility]* (mail-order)             ,          Ph: [phone]       Fax: [phone]   RxID:   [id number] VYTORIN 10-40 MG TABS (EZETIMIBE-SIMVASTATIN) 1/2 by mouth daily  #90 x 1   Entered and Authorized by:   Nance Pear FNP   Signed by:   Nance Pear FNP on 02/15/2009   Method used:   Faxed to ...       [facility]* (mail-order)             ,          Ph: [phone]       Fax: [phone]   RxID:   [id number] ENALAPRIL MALEATE 20 MG TABS (ENALAPRIL MALEATE) take one tablet two times a day  #180 x 1   Entered and Authorized by:   Nance Pear FNP   Signed by:   Nance Pear FNP on 02/15/2009   Method used:   Faxed to ...       [facility] (mail-order)             ,          Ph: [phone]       Fax: [phone]   RxID:   [id number] FUROSEMIDE 80 MG TABS (FUROSEMIDE) take one  tablet three times a day  #270 x 1   Entered and Authorized by:   Nance Pear FNP   Signed by:   Nance Pear FNP on 02/15/2009   Method used:   Faxed to ...       [facility]* (mail-order)             ,          Ph: [phone]       Fax: [phone]   RxIDQX:[id number] SYNTHROID 125 MCG TABS (LEVOTHYROXINE SODIUM) take one tablet daily  #90 x 1   Entered and Authorized by:   Nance Pear FNP   Signed by:   Nance Pear FNP on 02/15/2009   Method used:   Faxed to ...       [facility] (mail-order)             ,          Ph: [phone]       Fax: [phone]   RxID:   [id number]

## 2010-02-07 NOTE — Assessment & Plan Note (Signed)
Summary: discuss kidneys/spots on his legs/kdc   Vital Signs:  Patient profile:   70 year old male Height:      66.25 inches Weight:      193 pounds BMI:     31.03 Pulse rate:   52 / minute BP sitting:   130 / 40  (left arm)  Vitals Entered By: [name] (April 14, 2009 1:09 PM) CC: spots on arms and legs lots itching and also discuss kidney's getting ready for dialysis   History of Present Illness: 70 yo man here today for  1) spots on arms and legs- seen in Feb and dx'd w/ ringworm.  given Lotrimin.  no relief.  severe itching.  has seen Dr Delman Cheadle for derm issues in past.  spots are now spreading rapidly.  no one else in home has similar.  2) Renal insufficiency- following w/ Dr [name], preparing for dialysis.  now experiencing decreased urination.  has vascular appt upcoming (5/3).  having trouble sleeping due to concerns re: dialysis.  needs refill on Temazepam.  3) DM- pt is self titrating insulin based on CBGs.  reports decreased appetite due to renal failure so is decreasing novolin dose.  some days doesn't take insulin at all.  Problems Prior to Update: 1)  Anemia, Iron Deficiency  (ICD-280.9) 2)  Ringworm  (ICD-110.9) 3)  CHF  (ICD-428.0) 4)  Special Screening Malignant Neoplasm of Prostate  (ICD-V76.44) 5)  C A D  (ICD-414.00) 6)  Renal Insufficiency, Chronic  (ICD-585.9) 7)  Cerebrovascular Accident, Hx of  (ICD-V12.50) 8)  Colon Cancer, Hx of  (ICD-V10.05) 9)  Low Back Pain  (ICD-724.2) 10)  Hypothyroidism  (ICD-244.9) 11)  Hypertension  (ICD-401.9) 12)  Hyperlipidemia  (ICD-272.4) 13)  Gout  (ICD-274.9) 14)  Diabetes Mellitus, Type II  (ICD-250.00)  Current Medications (verified): 1)  Plavix 75 Mg Tabs (Clopidogrel Bisulfate) .... Take One Tablet Daily 2)  Carvedilol 25 Mg Tabs (Carvedilol) .... Take One Tablet Two Times A Day 3)  Enalapril Maleate 20 Mg Tabs (Enalapril Maleate) .... Take One Tablet Two Times A Day 4)  Synthroid 125 Mcg Tabs  (Levothyroxine Sodium) .... Take One Tablet Daily 5)  Allopurinol 300 Mg Tabs (Allopurinol) .... Take One Tablet Daily 6)  Furosemide 80 Mg Tabs (Furosemide) .... Take One Tablet Three Times A Day 7)  Terazosin Hcl 2 Mg Caps (Terazosin Hcl) .... Take One Tablet Daily 8)  Amlodipine Besylate 10 Mg Tabs (Amlodipine Besylate) .... Take One Tablet Daily 9)  Sodium Bicarbonate 650 Mg Tabs (Sodium Bicarbonate) .... Take Two Tablet Two Times A Day 10)  Aspirin Childrens 81 Mg Chew (Aspirin) .... Take One Tablet Daily 11)  Novolin N 100 Unit/ml Susp (Insulin Isophane Human) .... 40 Units Two Times A Day 12)  Temazepam 15 Mg Caps (Temazepam) .... Take One Tablet Daily As Needed 13)  Nitroglycerin 0.4 Mg Subl (Nitroglycerin) .... Take 1 Tab Sl As Needed Chest Pain 14)  Vytorin 10-20 Mg Tabs (Ezetimibe-Simvastatin) .... Take One-Half Tablet Daily 15)  Lotrimin Ultra 1 % Crea (Butenafine Hcl) .... Apply Twice Daily For 2-3 Weeks To Affected Area Until Healed 16)  Calcitriol 0.5 Mcg Caps (Calcitriol) .... Take One Tablet Daily 17)  Prodigy No Coding Blood Gluc  Strp (Glucose Blood) .... Use To Test As Directed.  Disp 3 Month Supply 18)  Hydrocortisone 2.5 % Oint (Hydrocortisone) .... Apply To Affected Area Two Times A Day.  Disp 1 Large Tube.  Allergies (verified): No Known Drug Allergies  Past History:  Past Medical History: Last updated: 09/20/2008 Diabetes mellitus, type II Gout Hyperlipidemia Hypertension Hypothyroidism Low back pain- s/p steroid injxn Colon cancer, hx of Cerebrovascular accident, hx of Stage 3 chronic kidney disease  Social History: Last updated: 12/08/2007 Married, never smoked, no ETOH.  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  Review of Systems General:  Complains of fatigue, loss of appetite, malaise, and sleep disorder. Eyes:  Denies blurring and double vision. CV:  Complains of swelling of feet; denies chest pain or discomfort, near fainting, palpitations, shortness of  breath with exertion, and swelling of hands. Derm:  Complains of itching and rash; denies poor wound healing. Psych:  Complains of anxiety and depression; denies suicidal thoughts/plans.  Physical Exam  General:  Well-developed,well-nourished,in no acute distress; alert,appropriate and cooperative throughout examination Head:  Normocephalic and atraumatic without obvious abnormalities.  balding. Neck:  No deformities, masses, or tenderness noted. Lungs:  Normal respiratory effort, chest expands symmetrically. Lungs are clear to auscultation, no crackles or wheezes. Heart:  + grade 2-3 systolic murmur Pulses:  +2 cartoid, radial, DP Extremities:  1-2+ bilateral lower extremity edema + rough raised hyperpigmented patch about 1" in diameter on left calf Skin:  in addition to area on L calf (see extremities) pt w/ multiple erythematous, flat, pruritic lesions on arms and legs bilaterally- not consistent w/ ringworm   Impression & Recommendations:  Problem # 1:  RASH-NONVESICULAR (ICD-782.1) Assessment New  pt's rash not consistent w/ ringworm.  will start steroid cream and refer to derm as # of areas are rapidly increasing.  not consistent w/ insect bites, contact dermatitis, hives. His updated medication list for this problem includes:    Lotrimin Ultra 1 % Crea (Butenafine hcl) .Marland Kitchen... Apply twice daily for 2-3 weeks to affected area until healed    Hydrocortisone 2.5 % Oint (Hydrocortisone) .Marland Kitchen... Apply to affected area two times a day.  disp 1 large tube.  Orders: Dermatology Referral (Derma)  Problem # 2:  DIABETES MELLITUS, TYPE II (ICD-250.00) Assessment: Unchanged pt not due for A1C today, will have this done at upcoming CPE.  self adjusting insulin based on CBGs- decreased appetite due to renal failure.  in agreement w/ self titration, pt aware of how and is able to do this. His updated medication list for this problem includes:    Enalapril Maleate 20 Mg Tabs (Enalapril maleate)  .Marland Kitchen... Take one tablet two times a day    Aspirin Childrens 81 Mg Chew (Aspirin) .Marland Kitchen... Take one tablet daily    Novolin N 100 Unit/ml Susp (Insulin isophane human) .Marland KitchenMarland KitchenMarland KitchenMarland Kitchen 40 units two times a day  Problem # 3:  RENAL INSUFFICIENCY, CHRONIC (ICD-585.9) Assessment: Deteriorated pt is preparing for HD.  has appt upcoming for vascular access.  spent most of visit discussing how he feels about this big change.  pt is nervous and upset.  admits to feeling down but feels at this time it is situational.  reviewed red flags for depression and also encouraged him to talk to his family about his feelings.  will follow closely.  Complete Medication List: 1)  Plavix 75 Mg Tabs (Clopidogrel bisulfate) .... Take one tablet daily 2)  Carvedilol 25 Mg Tabs (Carvedilol) .... Take one tablet two times a day 3)  Enalapril Maleate 20 Mg Tabs (Enalapril maleate) .... Take one tablet two times a day 4)  Synthroid 125 Mcg Tabs (Levothyroxine sodium) .... Take one tablet daily 5)  Allopurinol 300 Mg Tabs (Allopurinol) .... Take one tablet daily 6)  Furosemide  80 Mg Tabs (Furosemide) .... Take one tablet three times a day 7)  Terazosin Hcl 2 Mg Caps (Terazosin hcl) .... Take one tablet daily 8)  Amlodipine Besylate 10 Mg Tabs (Amlodipine besylate) .... Take one tablet daily 9)  Sodium Bicarbonate 650 Mg Tabs (Sodium bicarbonate) .... Take two tablet two times a day 10)  Aspirin Childrens 81 Mg Chew (Aspirin) .... Take one tablet daily 11)  Novolin N 100 Unit/ml Susp (Insulin isophane human) .... 40 units two times a day 12)  Temazepam 15 Mg Caps (Temazepam) .... Take one tablet daily as needed 13)  Nitroglycerin 0.4 Mg Subl (Nitroglycerin) .... Take 1 tab sl as needed chest pain 14)  Vytorin 10-20 Mg Tabs (Ezetimibe-simvastatin) .... Take one-half tablet daily 15)  Lotrimin Ultra 1 % Crea (Butenafine hcl) .... Apply twice daily for 2-3 weeks to affected area until healed 16)  Calcitriol 0.5 Mcg Caps (Calcitriol) ....  Take one tablet daily 17)  Prodigy No Coding Blood Gluc Strp (Glucose blood) .... Use to test as directed.  disp 3 month supply 18)  Hydrocortisone 2.5 % Oint (Hydrocortisone) .... Apply to affected area two times a day.  disp 1 large tube.  Patient Instructions: 1)  Please schedule your complete physical for June 2)  Use the steroid cream two times a day for itching 3)  Someone will call you with your Derm appt 4)  If you find yourself sad or depressed and it's not improving- please call 5)  Hang in there!!! Prescriptions: HYDROCORTISONE 2.5 % OINT (HYDROCORTISONE) apply to affected area two times a day.  disp 1 large tube.  #1 x 3   Entered and Authorized by:   Annye Asa MD   Signed by:   Annye Asa MD on 04/14/2009   Method used:   Electronically to        [id number] (retail)       [place], [place]  [address]       Ph: [phone]       Fax: [phone]   RxID:   [id number] TEMAZEPAM 15 MG CAPS (TEMAZEPAM) take one tablet daily as needed  #90 x 0   Entered and Authorized by:   Annye Asa MD   Signed by:   Annye Asa MD on 04/14/2009   Method used:   Print then Give to Patient   RxID:   [id number] PRODIGY NO CODING BLOOD GLUC  STRP (GLUCOSE BLOOD) use to test as directed.  disp 3 month supply  #3 x 3   Entered and Authorized by:   Annye Asa MD   Signed by:   Annye Asa MD on 04/14/2009   Method used:   Print then Give to Patient   RxID:   [id number]

## 2010-02-07 NOTE — Letter (Signed)
Summary: [facility]   Imported By: Phillis Knack 08/03/2009 14:37:43  _____________________________________________________________________  External Attachment:    Type:   Image     Comment:   External Document

## 2010-02-07 NOTE — Assessment & Plan Note (Signed)
Summary: ROUTINE COL WANTED, PLAVIX & ASPRIN, HX COL CANCER...AS.   History of Present Illness Visit Type: Initial Consult Primary GI MD: Delfin Edis MD Primary Provider: Annye Asa, MD Requesting Provider: Annye Asa, MD Chief Complaint: Patinet here to discuss having a colonoscopy he is on Plavix. Patient al so has a personal hx of colon cancer 2003. 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 in 2002. He underwent  laser ablation and segmental resection of the transverse and descending colon by Dr. Margot Chimes in 2002. He is here to discuss having a followup colonoscopy. He had a colonoscopy in 2003, June 2004, June 2006 and in June 2009. He had no further  polyps. He is currently on Plavix and aspirin having chronic renal insufficiency, history of CVA in 1998 and a coronary artery bypass graft in 1992 and again in 2006. He is an insulin-dependent diabetic and was at one point diagnosed with Barrett's esophagus although his last upper endoscopy in July 2002 showed duodenitis only. He has chronic diarrhea since his cholecystectomy in January 2009. He was given Questran but did not take it. He takes Imodium p.r.n. diarrhea and he has frequent accidents.   GI Review of Systems      Denies abdominal pain, acid reflux, belching, bloating, chest pain, dysphagia with liquids, dysphagia with solids, heartburn, loss of appetite, nausea, vomiting, vomiting blood, weight loss, and  weight gain.        Denies anal fissure, black tarry stools, change in bowel habit, constipation, diarrhea, diverticulosis, fecal incontinence, heme positive stool, hemorrhoids, irritable bowel syndrome, jaundice, light color stool, liver problems, rectal bleeding, and  rectal pain. Preventive Screening-Counseling & Management      Drug Use:  no.      Current Medications (verified): 1)  Plavix 75 Mg  Tabs (Clopidogrel Bisulfate) .... Take One Tablet Daily 2)  Carvedilol 25 Mg Tabs (Carvedilol) .... Take One Tablet Two Times A Day 3)  Enalapril Maleate 20 Mg Tabs (Enalapril Maleate) .... Take One Tablet Daily 4)  Synthroid 125 Mcg Tabs (Levothyroxine Sodium) .... Take One Tablet Daily 5)  Allopurinol 300 Mg Tabs (Allopurinol) .... Take One Tablet Daily 6)  Furosemide 80 Mg Tabs (Furosemide) .... Take One Tablet Three Times A Day 7)  Terazosin Hcl 2 Mg Caps (Terazosin Hcl) .... Take One Tablet Daily 8)  Amlodipine Besylate 10 Mg Tabs (Amlodipine Besylate) .... Take 1/2 Tablet Daily 9)  Sodium Bicarbonate 650 Mg Tabs (Sodium Bicarbonate) .... Take Two Tablet Two Times A Day 10)  Aspirin Childrens 81 Mg Chew (Aspirin) .... Take One Tablet Daily 11)  Novolin N 100 Unit/ml Susp (Insulin Isophane Human) .... 40 Units Two Times A Day 12)  Temazepam 15 Mg Caps (Temazepam) .... Take One Tablet Daily As Needed 13)  Nitroglycerin 0.4 Mg Subl (Nitroglycerin) .... Take 1 Tab Sl As Needed Chest Pain 14)  Vytorin 10-20 Mg Tabs (Ezetimibe-Simvastatin) .... Take One-Half Tablet Daily 15)  Calcitriol 0.5 Mcg Caps (Calcitriol) .... Take One Tablet Daily 16)  Prodigy No Coding Blood Gluc  Strp (Glucose Blood) .... Use To Test As Directed.  Disp 3 Month Supply  Allergies (verified): No Known Drug Allergies  Past History:  Past Medical History: Diabetes mellitus, type II Gout Hyperlipidemia Hypertension Hypothyroidism Low back pain- s/p steroid injxn Colon cancer, hx of 2003 Cerebrovascular accident, hx of Stage 3 chronic kidney disease  Anemia Stroke  Past Surgical History: Appendectomy Coronary artery bypass graft x2 Cholecystectomy Bilateral Cataracts removed Spinal fusion AV fistula 5/11 Resection of transverse and proximal descending colon with primary anastomosis  2003  Family History: CAD-mother, father HTN-mother, father STROKE-no DM-mother, father PROSTATE CA-no No FH of Colon  Cancer: Family History of Colon Polyps: daughter  Social History: Married 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 Patient has never smoked.  Alcohol Use - no Illicit Drug Use - no Drug Use:  no  Review of Systems       The patient complains of arthritis/joint pain, back pain, change in vision, heart murmur, itching, skin rash, sleeping problems, and swelling of feet/legs.  The patient denies allergy/sinus, anemia, anxiety-new, blood in urine, breast changes/lumps, confusion, cough, coughing up blood, depression-new, fainting, fatigue, fever, headaches-new, hearing problems, heart rhythm changes, menstrual pain, muscle pains/cramps, night sweats, nosebleeds, pregnancy symptoms, shortness of breath, sore throat, swollen lymph glands, thirst - excessive , urination - excessive , urination changes/pain, urine leakage, vision changes, and voice change.         Pertinent positive and negative review of systems were noted in the above HPI. All other ROS was otherwise negative.   Vital Signs:  Patient profile:   70 year old male Height:      66.25 inches Weight:      186.4 pounds BMI:     29.97 Pulse rate:   60 / minute Pulse rhythm:   regular BP sitting:   122 / 50  (left arm) Cuff size:   regular  Vitals Entered By: Bernita Buffy CMA Deborra Medina) (August 17, 2009 2:52 PM)  Physical Exam  General:  Well developed, well nourished, no acute distress. Eyes:  PERRLA, no icterus. Mouth:  No deformity or lesions, dentition normal. Neck:  Supple; no masses or thyromegaly. Chest Wall:  post-thoracotomy scar. Lungs:  Clear throughout to auscultation. Heart:  1/6 systolic murmur. Abdomen:  soft abdomen is soft aortic bruit. Decreased muscle tone. No tenderness. Normoactive bowel sounds Rectal:  soft Hemoccult negative stool. Extremities:  1+ edema.   Impression & Recommendations:  Problem # 1:  BARRETT'S ESOPHAGUS, HX OF (ICD-V12.79) Not reproduced on patient's llast upper endoscopy. He will need a repeat  endoscopy at the time of his recall colonoscopy.  Problem # 2:  SPECIAL SCREENING MALIGNANT NEOPLASM OF PROSTATE (ICD-V76.44) Patient has a history of adenocarcinoma of a sigmoid polyp and is status post multiple negative colonoscopy. His next recall colonoscopy at a 5 year interval would be in June 2014.  Problem # 3:  COLON CANCER, HX OF (ICD-V10.05) Patient is due for a recall colonoscopy in 2014.  Problem # 4:  DIARRHEA, CHRONIC (ICD-787.91) This is a result of a partial colon resection as well as from a cholecystectomy. He will start taking Imodium 1-2 a day. I have offered Colestid 1 g twice a day but he would prefer to start Imodium first.  Patient Instructions: 1)  Imodium OTC one p.o. b.i.d. or q.d. 2)  Recall colonoscopy June 2014. 3)  Consider adding Colestid 1 g 2 tablets daily. 4)  Patient will need an upper endoscopy and biopsy at the time of colonoscopy to followup on Barrett's esophagus. 5)  Copy sent to : Dr Raliegh Ip.Tabori 6)  The medication list was reviewed and reconciled.  All changed / newly prescribed medications were explained.  A complete medication list was provided to the patient / caregiver.

## 2010-02-07 NOTE — Letter (Signed)
Summary: [facility]   Imported By: Phillis Knack 08/03/2009 14:44:37  _____________________________________________________________________  External Attachment:    Type:   Image     Comment:   External Document

## 2010-02-07 NOTE — Progress Notes (Signed)
Summary: temazepam refill   Phone Note Refill Request Message from:  Fax from Pharmacy on September 05, 2009 1:18 PM  Refills Requested: Medication #1:  TEMAZEPAM 15 MG CAPS take one tablet daily as needed [facility] [address] - fax [phone]  Initial call taken by: Arbie [place],  September 05, 2009 1:20 PM    Prescriptions: TEMAZEPAM 15 MG CAPS (TEMAZEPAM) take one tablet daily as needed  #90 x 0   Entered by:   [name] CMA   Authorized by:   Annye Asa MD   Signed by:   [name] CMA on 09/05/2009   Method used:   Printed then faxed to ...       [facility] [address] [id number]* (retail)       [place], [place]  [address]       Ph: [phone]       Fax: [phone]   RxID:   [id number]

## 2010-02-07 NOTE — Letter (Signed)
Summary: [facility]   Imported By: Phillis Knack 08/03/2009 14:43:35  _____________________________________________________________________  External Attachment:    Type:   Image     Comment:   External Document

## 2010-02-07 NOTE — Op Note (Signed)
Summary: COLON (Dr Lajoyce Corners)  NAME:  ABDURRAHMAN, [name] [name]                ACCOUNT NO.:  [id number]      MEDICAL RECORD NO.:  [id number]          PATIENT TYPE:  AMB      LOCATION:  ENDO                         FACILITY:  [facility]      PHYSICIAN:  Waverly Ferrari, M.D.    DATE OF BIRTH:  January 21, 1940      DATE OF PROCEDURE:   DATE OF DISCHARGE:                                  OPERATIVE REPORT      PROCEDURE:  Colonoscopy.      INDICATIONS:   1. Colon cancer.   2. Diarrhea.      ANESTHESIA:  Fentanyl 100 mcg, Versed 9 mg.      DESCRIPTION OF PROCEDURE:  With the patient mildly sedated in the left   lateral decubitus position, a rectal examination was performed which was   unremarkable to my exam.  Subsequently, the Pentax videoscopic   colonoscope was inserted into the rectum and passed under direct vision   to the cecum, identified by ileocecal valve and appendiceal orifice,   both of which were photographed.  From this point, the colonoscope was   slowly withdrawn taking circumferential views of colonic mucosa,   stopping to take random biopsies along the way from normal-appearing   mucosa until we reached the rectum which appeared normal on direct and   on retroflexed view as well.  The endoscope was straightened and   withdrawn.  The patient's vital signs and pulse oximeter remained   stable.  The patient tolerated the procedure well without apparent   complications.      FINDINGS:  Unremarkable examination.  Anastomotic site well seen, well-   healed.      PLAN:  Await biopsy reports for evaluation of his diarrhea, which he   states is somewhat better on Questran powder.  The patient will call me   for results and follow-up with me as needed as an outpatient.                  ______________________________   Waverly Ferrari, M.D.            GMO/MEDQ  D:  06/20/2007  T:  06/20/2007  Job:  [id number]      cc:   Jeneen Rinks L. Deterding, M.D.   Fax: [phone]      Nelda Severe. Juventino Slovak, M.D.   Fax:  [phone]

## 2010-02-07 NOTE — Letter (Signed)
Summary: New Patient letter  [facility]  [address] [place], [place] [address]   Phone: [phone]  Fax: [phone]       06/13/2009 MRN: [id number]  [name] [name] [address] [place], [place]  [address]  Dear Mr. PARKS,  Welcome to the Gastroenterology Division at [facility].    You are scheduled to see Dr. Olevia Perches on 08/17/2009 at 2:45PM on the 3rd floor at [facility], [place] [employer].  We ask that you try to arrive at our office 15 minutes prior to your appointment time to allow for check-in.  We would like you to complete the enclosed self-administered evaluation form prior to your visit and bring it with you on the day of your appointment.  We will review it with you.  Also, please bring a complete list of all your medications or, if you prefer, bring the medication bottles and we will list them.  Please bring your insurance card so that we may make a copy of it.  If your insurance requires a referral to see a specialist, please bring your referral form from your primary care physician.  Co-payments are due at the time of your visit and may be paid by cash, check or credit card.     Your office visit will consist of a consult with your physician (includes a physical exam), any laboratory testing he/she may order, scheduling of any necessary diagnostic testing (e.g. x-ray, ultrasound, CT-scan), and scheduling of a procedure (e.g. Endoscopy, Colonoscopy) if required.  Please allow enough time on your schedule to allow for any/all of these possibilities.    If you cannot keep your appointment, please call [phone] to cancel or reschedule prior to your appointment date.  This allows [place] the opportunity to schedule an appointment for another patient in need of care.  If you do not cancel or reschedule by 5 p.m. the business day prior to your appointment date, you will be charged a $50.00 late cancellation/no-show fee.    Thank you for choosing  [facility] for your medical needs.  We appreciate the opportunity to care for you.  Please visit [place] at our website  to learn more about our practice.                     Sincerely,                                                             The Gastroenterology Division

## 2010-02-07 NOTE — Op Note (Signed)
Summary: COLON (Dr Jeannetta [facility]  Patient:    [name] [name], [name] [name]                       MRN: [id number] Adm. Date:  [id number] Attending:  Jim Desanctis CC:         Joyice Faster. Deterding, M.D.  Stanley C. Redmond Pulling, M.D.  Richard A. Rollene Fare, M.D.   Procedure Report  PROCEDURE:  Colonoscopy.  INDICATIONS:  Iron deficiency anemia, Hemoccult-positive stools.  ANESTHESIA:  Versed an extra 2 mg.  DESCRIPTION OF PROCEDURE:  With patient mildly sedated in the left lateral decubitus position, the Olympus videoscopic colonoscope was inserted in the rectum and passed under direct vision to the cecum, identified by the ileocecal valve and appendiceal orifice, both of which were photographed. From this point the colonoscope was slowly withdrawn, taking circumferential views of the entire colonic mucosa, stopping in the descending colon just distal to the splenic flexure, where a very large, probably 2.5 cm, polyp was seen, photographed.  It was oozing somewhat and may, in fact, be the cause also partially of patients blood loss.  See endoscopy note as well.  This polyp was photographed and placed in position for snaring, which we did using ERBE argon photocoagulator generator to snare this polyp through.  Once this was removed, the remainder of the polyp had to be removed using the ERBE argon photocoagulator, which we did, and we finally eradicated the entire polyp/ tumor with good photocoagulation and good hemostasis noted.  After this was accomplished, the endoscope was then further withdrawn, taking circumferential views of the remaining colonic mucosa, stopping at the rectosigmoid area at 10-20 cm from the anal verge, at which point two other polyps were seen, photographed, and removed using both snare cautery technique and hot biopsy forceps technique.  Once these were removed, the endoscope was pulled back to the rectum, which appeared normal on  direct and retroflexed view.  The endoscope was straightened and withdrawn.  The patients vital signs and pulse oximetry remained stable.  Patient tolerated the procedure well without apparent complications.  FINDINGS:  Very large polyp of descending colon, removed and eradicated using the argon photocoagulator in both the snare and with the coagulating tip. Second and third polyp seen in the rectosigmoid areas at 10-20 cm from the anal verge, also removed and placed in one bottle.  PLAN:  Await biopsy report.  Will hold Coumadin for three more days given the size and extent of the polyp removed from the descending colon, and will have patient call me for results of biopsy and follow up with me as an outpatient. DD:  08/06/00 TD:  08/06/00 Job: [id number]   FINAL DIAGNOSIS    ***MICROSCOPIC EXAMINATION AND DIAGNOSIS***    I. DESCENDING COLON POLYP: ADENOMATOUS POLYP WITH FOCAL   ADENOCARCINOMA. SEE   COMMENT.   II. TWO POLYPS AT 10.CM AND 20.0 CM: HYPERPLASTIC POLYPS.    Louanne Belton Rodney Cruise MD    COMMENT   I. There is adenocarcinoma arising in an adenomatous polyp. In   addition to what appears to be adenocarcinoma in situ, there are   pools of mucin with atypical glandular cells which are present in   the submucosa and at the margin (deep). 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 Dr. Saralyn Pilar has  also reviewed this material and is in essential agreement with   this interpretation. The findings are discussed with Dr. Jim Desanctis at 2 P.M. on 08/07/00. (EAA:jn, 08/07/00)    jn   Date Reported: 08/07/2000 Louanne Belton. Rodney Cruise, MD   *** Electronically Signed Out By EAA ***    Clinical information   (JC)    specimen(s) obtained   1: DESCENDING COLON POLYP   2: 2 POLYPS AT 10CM/20CM    Gross Description   I. Received in formalin is a 1.1 cm in greatest dimensions tan   red polypoid tissue which is bisected and submitted in one block.    II.  Received in formalin are tan, soft tissue fragments that are   submitted in toto. Number: three   Size: 0.2 to 0.3 cm (SSW:kcv 7-30)    kv/

## 2010-02-07 NOTE — Assessment & Plan Note (Signed)
Summary: cpx//pt will be fasting//lch   Vital Signs:  Patient profile:   70 year old male Height:      66.25 inches Weight:      192.50 pounds BMI:     30.95 Pulse rate:   72 / minute BP sitting:   100 / 60  Vitals Entered By: Verdie Mosher (June 13, 2009 8:41 AM) CC: cpx   History of Present Illness: 70 yo man here today for CPE  Here for Medicare AWV:  1.   Risk factors based on Past M, S, F history: - DM: due for A1C, had recent eye exam, has seen podiatry - Renal Failure: had AV fistula placed last month, follows w/ Dr [name] - CAD s/p CABG: follows w/ Dr Rollene Fare at [facility]: last colonoscopy 2006 and was told to f/u in 5 yrs.  GI doctor has left, needs new one - PSA: due based on age - HTN: pt has been hypotensive lately as kidney fxn worsens.  has at times been dizzy. 2.   Physical Activities: walks, swims 3.   Depression/mood: not currently depressed but will fluctuate at times given situation w/ upcoming HD 4.   Hearing: no concerns 5.   ADL's: independent 6.   Fall Risk: not at risk 7.   Home Safety: safe at home, wife, daughter, grandson present 73.   Height, weight, &visual acuity: see vitals, had eye exam and retinal surgery in 5/11. 9.   Counseling: pt UTD on health maintanence, will refer to GI to establish care 10.   Labs ordered based on risk factors: A1C, BMP, PSA 11.           Referral Coordination: GI 12.           Care Plan: See A/P 13.           Cognitive Assessment: AAOx3, 3/3 object recall, W-O-R-L-D  Preventive Screening-Counseling & Management  Alcohol-Tobacco     Alcohol drinks/day: 0     Smoking Status: never  Caffeine-Diet-Exercise     Does Patient Exercise: no      Sexual History:  currently monogamous.        Drug Use:  never.    Problems Prior to Update: 1)  Physical Examination  (ICD-V70.0) 2)  Rash-nonvesicular  (ICD-782.1) 3)  Anemia, Iron Deficiency  (ICD-280.9) 4)  Ringworm  (ICD-110.9) 5)  CHF   (ICD-428.0) 6)  Special Screening Malignant Neoplasm of Prostate  (ICD-V76.44) 7)  C A D  (ICD-414.00) 8)  Renal Insufficiency, Chronic  (ICD-585.9) 9)  Cerebrovascular Accident, Hx of  (ICD-V12.50) 10)  Colon Cancer, Hx of  (ICD-V10.05) 11)  Low Back Pain  (ICD-724.2) 12)  Hypothyroidism  (ICD-244.9) 13)  Hypertension  (ICD-401.9) 14)  Hyperlipidemia  (ICD-272.4) 15)  Gout  (ICD-274.9) 16)  Diabetes Mellitus, Type II  (ICD-250.00)  Current Medications (verified): 1)  Plavix 75 Mg Tabs (Clopidogrel Bisulfate) .... Take One Tablet Daily 2)  Carvedilol 25 Mg Tabs (Carvedilol) .... Take One Tablet Two Times A Day 3)  Enalapril Maleate 20 Mg Tabs (Enalapril Maleate) .... Take One Tablet Two Times A Day 4)  Synthroid 125 Mcg Tabs (Levothyroxine Sodium) .... Take One Tablet Daily 5)  Allopurinol 300 Mg Tabs (Allopurinol) .... Take One Tablet Daily 6)  Furosemide 80 Mg Tabs (Furosemide) .... Take One Tablet Three Times A Day 7)  Terazosin Hcl 2 Mg Caps (Terazosin Hcl) .... Take One Tablet Daily 8)  Amlodipine Besylate 10 Mg Tabs (  Amlodipine Besylate) .... Take One Tablet Daily 9)  Sodium Bicarbonate 650 Mg Tabs (Sodium Bicarbonate) .... Take Two Tablet Two Times A Day 10)  Aspirin Childrens 81 Mg Chew (Aspirin) .... Take One Tablet Daily 11)  Novolin N 100 Unit/ml Susp (Insulin Isophane Human) .... 40 Units Two Times A Day 12)  Temazepam 15 Mg Caps (Temazepam) .... Take One Tablet Daily As Needed 13)  Nitroglycerin 0.4 Mg Subl (Nitroglycerin) .... Take 1 Tab Sl As Needed Chest Pain 14)  Vytorin 10-20 Mg Tabs (Ezetimibe-Simvastatin) .... Take One-Half Tablet Daily 15)  Lotrimin Ultra 1 % Crea (Butenafine Hcl) .... Apply Twice Daily For 2-3 Weeks To Affected Area Until Healed 16)  Calcitriol 0.5 Mcg Caps (Calcitriol) .... Take One Tablet Daily 17)  Prodigy No Coding Blood Gluc  Strp (Glucose Blood) .... Use To Test As Directed.  Disp 3 Month Supply 18)  Hydrocortisone 2.5 % Oint  (Hydrocortisone) .... Apply To Affected Area Two Times A Day.  Disp 1 Large Tube.  Allergies (verified): No Known Drug Allergies  Past History:  Past Medical History: Last updated: 09/20/2008 Diabetes mellitus, type II Gout Hyperlipidemia Hypertension Hypothyroidism Low back pain- s/p steroid injxn Colon cancer, hx of Cerebrovascular accident, hx of Stage 3 chronic kidney disease  Family History: Last updated: 12/08/2007 CAD-mother, father HTN-mother, father STROKE-no DM-mother, father COLON CA-no PROSTATE CA-no  Social History: Last updated: 12/08/2007 Married, never smoked, no ETOH.  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  Past Surgical History: Appendectomy Coronary artery bypass graft x2 Cholecystectomy Cataracts removed Colon CA Spinal fusion AV fistula 5/11  Social History: Does Patient Exercise:  no  Review of Systems  The patient denies anorexia, fever, weight loss, weight gain, vision loss, decreased hearing, hoarseness, chest pain, syncope, dyspnea on exertion, peripheral edema, prolonged cough, headaches, abdominal pain, melena, hematochezia, severe indigestion/heartburn, hematuria, suspicious skin lesions, depression, abnormal bleeding, enlarged lymph nodes, and testicular masses.    Physical Exam  General:  Well-developed,well-nourished,in no acute distress; alert,appropriate and cooperative throughout examination Head:  Normocephalic and atraumatic without obvious abnormalities.  balding. Eyes:  PERRLA, fundoscopic exam deferred to ophtho Ears:  External ear exam shows no significant lesions or deformities.  Otoscopic examination reveals clear canals, tympanic membranes are intact bilaterally without bulging, retraction, inflammation or discharge. Hearing is grossly normal bilaterally. Nose:  External nasal examination shows no deformity or inflammation. Nasal mucosa are pink and moist without lesions or exudates. Mouth:  Oral mucosa and oropharynx without  lesions or exudates.  Teeth in good repair. Neck:  No deformities, masses, or tenderness noted. Lungs:  Normal respiratory effort, chest expands symmetrically. Lungs are clear to auscultation, no crackles or wheezes. Heart:  + grade 3 systolic murmur Abdomen:  soft, NT/ND, +BS Rectal:  No external abnormalities noted. Normal sphincter tone. No rectal masses or tenderness. Genitalia:  Testes bilaterally descended without nodularity, tenderness or masses. No scrotal masses or lesions. No penis lesions or urethral discharge. Prostate:  Prostate gland firm and smooth, no enlargement, nodularity, tenderness, mass, asymmetry or induration. Msk:  No deformity or scoliosis noted of thoracic or lumbar spine.   Pulses:  +2 cartoid, radial +1 DP bilaterally good pulse in AV fistula at L elbow Extremities:  1+ bilateral lower extremity edema  Neurologic:  No cranial nerve deficits noted. Station and gait are normal. Plantar reflexes are down-going bilaterally. DTRs are symmetrical throughout. Sensory, motor and coordinative functions appear intact. Skin:  Intact without suspicious lesions or rashes Cervical Nodes:  No lymphadenopathy noted Inguinal Nodes:  No significant adenopathy Psych:  Cognition and judgment appear intact. Alert and cooperative with normal attention span and concentration. No apparent delusions, illusions, hallucinations   Impression & Recommendations:  Problem # 1:  PHYSICAL EXAMINATION (ICD-V70.0) Assessment New pt's PE WNL.  needs GI referral.  EKG obtained to have one on file.  most labs were done in Feb but necessary ones were drawn today. Orders: EKG w/ Interpretation (93000) First annual wellness visit with prevention plan  [id number]) Prostate / PSA (Medicare) [id number]) DRE (G0102)  Problem # 2:  DIABETES MELLITUS, TYPE II (ICD-250.00) Assessment: Unchanged due for A1C.  adjust meds accordingly. His updated medication list for this problem includes:    Enalapril Maleate  20 Mg Tabs (Enalapril maleate) .Marland Kitchen... Take one tablet daily    Aspirin Childrens 81 Mg Chew (Aspirin) .Marland Kitchen... Take one tablet daily    Novolin N 100 Unit/ml Susp (Insulin isophane human) .Marland KitchenMarland KitchenMarland KitchenMarland Kitchen 40 units two times a day  Orders: Venipuncture [id number]) TLB-A1C / Hgb A1C (Glycohemoglobin) (83036-A1C) TLB-BMP (Basic Metabolic Panel-BMET) (99991111)  Problem # 3:  RENAL INSUFFICIENCY, CHRONIC (ICD-585.9) Assessment: Unchanged fistula now in place for HD.  no word on intended start date.  has f/u w/ Dr Deterding the end of the month.  Problem # 4:  HYPERTENSION (ICD-401.9) Assessment: Unchanged pt now having hypotensive episodes.  will decrease enalapril from two times a day to daily and amlodipine to 1/2 tab.  recheck BP w/ Dr Deterding in 3 weeks. His updated medication list for this problem includes:    Carvedilol 25 Mg Tabs (Carvedilol) .Marland Kitchen... Take one tablet two times a day    Enalapril Maleate 20 Mg Tabs (Enalapril maleate) .Marland Kitchen... Take one tablet daily    Furosemide 80 Mg Tabs (Furosemide) .Marland Kitchen... Take one tablet three times a day    Terazosin Hcl 2 Mg Caps (Terazosin hcl) .Marland Kitchen... Take one tablet daily    Amlodipine Besylate 10 Mg Tabs (Amlodipine besylate) .Marland Kitchen... Take 1/2 tablet daily  Problem # 5:  HYPERLIPIDEMIA (ICD-272.4) Assessment: Unchanged at last check in 2/11 LDL was 20!  no need to repeat labs at this time. His updated medication list for this problem includes:    Vytorin 10-20 Mg Tabs (Ezetimibe-simvastatin) .Marland Kitchen... Take one-half tablet daily  Problem # 6:  COLON CANCER, HX OF (ICD-V10.05) Assessment: Unchanged  needs GI referral since previous MD has left  Orders: Gastroenterology Referral (GI)  Complete Medication List: 1)  Plavix 75 Mg Tabs (Clopidogrel bisulfate) .... Take one tablet daily 2)  Carvedilol 25 Mg Tabs (Carvedilol) .... Take one tablet two times a day 3)  Enalapril Maleate 20 Mg Tabs (Enalapril maleate) .... Take one tablet daily 4)  Synthroid 125 Mcg Tabs  (Levothyroxine sodium) .... Take one tablet daily 5)  Allopurinol 300 Mg Tabs (Allopurinol) .... Take one tablet daily 6)  Furosemide 80 Mg Tabs (Furosemide) .... Take one tablet three times a day 7)  Terazosin Hcl 2 Mg Caps (Terazosin hcl) .... Take one tablet daily 8)  Amlodipine Besylate 10 Mg Tabs (Amlodipine besylate) .... Take 1/2 tablet daily 9)  Sodium Bicarbonate 650 Mg Tabs (Sodium bicarbonate) .... Take two tablet two times a day 10)  Aspirin Childrens 81 Mg Chew (Aspirin) .... Take one tablet daily 11)  Novolin N 100 Unit/ml Susp (Insulin isophane human) .... 40 units two times a day 12)  Temazepam 15 Mg Caps (Temazepam) .... Take one tablet daily as needed 13)  Nitroglycerin 0.4 Mg Subl (Nitroglycerin) .... Take 1 tab sl as needed  chest pain 14)  Vytorin 10-20 Mg Tabs (Ezetimibe-simvastatin) .... Take one-half tablet daily 15)  Lotrimin Ultra 1 % Crea (Butenafine hcl) .... Apply twice daily for 2-3 weeks to affected area until healed 16)  Calcitriol 0.5 Mcg Caps (Calcitriol) .... Take one tablet daily 17)  Prodigy No Coding Blood Gluc Strp (Glucose blood) .... Use to test as directed.  disp 3 month supply 18)  Hydrocortisone 2.5 % Oint (Hydrocortisone) .... Apply to affected area two times a day.  disp 1 large tube.  Other Orders: TLB-PSA (Prostate Specific Antigen) (84153-PSA)  Patient Instructions: 1)  Please follow up in 3 months for your diabetes check- you can eat before this appt 2)  Decrease the Enalapril to once daily 3)  Decrease the Amlodipine to 1/2 tab daily 4)  We'll notify you of your lab results 5)  Call with any questions or concerns 6)  Have a great summer!

## 2010-02-07 NOTE — Letter (Signed)
Summary: [facility]   Imported By: Edmonia James 10/28/2009 08:16:05  _____________________________________________________________________  External Attachment:    Type:   Image     Comment:   External Document

## 2010-02-07 NOTE — Letter (Signed)
Summary: [facility]  [facility]   Imported By: Edmonia James 06/08/2009 08:46:39  _____________________________________________________________________  External Attachment:    Type:   Image     Comment:   External Document

## 2010-02-07 NOTE — Assessment & Plan Note (Signed)
Summary: diabetic check/cbs   Vital Signs:  Patient profile:   70 year old male Weight:      185 pounds Pulse rate:   60 / minute BP sitting:   120 / 72  Vitals Entered By: [name] CMA (December 16, 2009 11:09 AM)  History of Present Illness: 70 yo man here today for   1) DM- CBG 94 this AM.  last week was 48- felt shaky and dizzy.  symptomatic lows are infrequent.  not eating regularly due to renal failure.  taking Novolin 30 units two times a day.  pt has been adjusting insulin based on his eating habits.  got a new meter this week- [employer].  UTD on eye exam, Dr Rodena Piety (sept)  2) HTN- excellent control.  on coreg (bid), enalapril (daily)and amlodipine (1/2 tab).  no CP, SOB, HAs, visual changes, edema.  3) Hypothyroid- due for labs.  4) Hyperlipidemia- pt on Vytorin, last lipid panel in February showed excellent control.  started on this by Dr Rollene Fare.  denies abd pain, N/V, myalgias  Current Medications (verified): 1)  Plavix 75 Mg Tabs (Clopidogrel Bisulfate) .... Take One Tablet Daily 2)  Carvedilol 25 Mg Tabs (Carvedilol) .... Take One Tablet Two Times A Day 3)  Enalapril Maleate 20 Mg Tabs (Enalapril Maleate) .... Take One Tablet Daily 4)  Synthroid 125 Mcg Tabs (Levothyroxine Sodium) .... Take One Tablet Daily 5)  Allopurinol 300 Mg Tabs (Allopurinol) .... Take One Tablet Daily 6)  Furosemide 80 Mg Tabs (Furosemide) .... Take One Tablet Three Times A Day 7)  Terazosin Hcl 2 Mg Caps (Terazosin Hcl) .... Take One Tablet Daily 8)  Amlodipine Besylate 10 Mg Tabs (Amlodipine Besylate) .... Take 1 Tablet Daily 9)  Sodium Bicarbonate 650 Mg Tabs (Sodium Bicarbonate) .... Take Two Tablet Two Times A Day 10)  Aspirin Childrens 81 Mg Chew (Aspirin) .... Take One Tablet Daily 11)  Novolin N 100 Unit/ml Susp (Insulin Isophane Human) .... 40 Units Two Times A Day 12)  Temazepam 15 Mg Caps (Temazepam) .... Take One Tablet Daily As Needed 13)  Nitroglycerin 0.4 Mg Subl  (Nitroglycerin) .... Take 1 Tab Sl As Needed Chest Pain 14)  Vytorin 10-20 Mg Tabs (Ezetimibe-Simvastatin) .... Take One-Half Tablet Daily 15)  Calcitriol 0.5 Mcg Caps (Calcitriol) .... Take One Tablet Daily 16)  Prodigy No Coding Blood Gluc  Strp (Glucose Blood) .... Use To Test As Directed.  Disp 3 Month Supply  Allergies (verified): No Known Drug Allergies  Past History:  Past medical, surgical, family and social histories (including risk factors) reviewed, and no changes noted (except as noted below).  Past Medical History: Reviewed history from 08/17/2009 and no changes required. Diabetes mellitus, type II Gout Hyperlipidemia Hypertension Hypothyroidism Low back pain- s/p steroid injxn Colon cancer, hx of 2003 Cerebrovascular accident, hx of Stage 3 chronic kidney disease Anemia Stroke  Past Surgical History: Reviewed history from 08/17/2009 and no changes required. Appendectomy Coronary artery bypass graft x2 Cholecystectomy Bilateral Cataracts removed Spinal fusion AV fistula 5/11 Resection of transverse and proximal descending colon with primary anastomosis  2003  Family History: Reviewed history from 08/17/2009 and no changes required. CAD-mother, father HTN-mother, father Claris Gladden DM-mother, father PROSTATE CA-no No FH of Colon Cancer: Family History of Colon Polyps: daughter  Social History: Reviewed history from 08/17/2009 and no changes required. 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  Alcohol Use - no Illicit Drug Use - no  Review of Systems  See HPI  Physical Exam  General:  Well-developed,well-nourished,in no acute distress; alert,appropriate and cooperative throughout examination Head:  Normocephalic and atraumatic without obvious abnormalities.  balding. Eyes:  PERRLA, EOMI Neck:  No deformities, masses, or tenderness noted. Lungs:  Normal respiratory effort, chest expands symmetrically. Lungs are clear to  auscultation, no crackles or wheezes. Heart:  + grade 3 systolic murmur Abdomen:  soft, NT/ND, +BS Pulses:  +2 cartoid, radial +1 DP bilaterally good pulse in AV fistula at L elbow Extremities:  trace bilateral lower extremity edema    Impression & Recommendations:  Problem # 1:  DIABETES MELLITUS, TYPE II (ICD-250.00) Assessment Unchanged due for labs.  concerned about erratic eating and low blood sugars on insulin.  stressed importance of regular eating and not using insulin if not eating.  will follow. His updated medication list for this problem includes:    Enalapril Maleate 20 Mg Tabs (Enalapril maleate) .Marland Kitchen... Take one tablet daily    Aspirin Childrens 81 Mg Chew (Aspirin) .Marland Kitchen... Take one tablet daily    Novolin N 100 Unit/ml Susp (Insulin isophane human) .Marland KitchenMarland KitchenMarland KitchenMarland Kitchen 40 units two times a day  Orders: Venipuncture [id number]) TLB-A1C / Hgb A1C (Glycohemoglobin) (83036-A1C) TLB-BMP (Basic Metabolic Panel-BMET) (99991111) Specimen Handling (99000) Prescription Created Electronically [id number])  Problem # 2:  HYPERTENSION (ICD-401.9) Assessment: Unchanged well controlled.  asymptomatic.  continue current meds. His updated medication list for this problem includes:    Carvedilol 25 Mg Tabs (Carvedilol) .Marland Kitchen... Take one tablet two times a day    Enalapril Maleate 20 Mg Tabs (Enalapril maleate) .Marland Kitchen... Take one tablet daily    Furosemide 80 Mg Tabs (Furosemide) .Marland Kitchen... Take one tablet three times a day    Terazosin Hcl 2 Mg Caps (Terazosin hcl) .Marland Kitchen... Take one tablet daily    Amlodipine Besylate 10 Mg Tabs (Amlodipine besylate) .Marland Kitchen... Take 1 tablet daily  Problem # 3:  HYPERLIPIDEMIA (ICD-272.4) Assessment: Unchanged exceptional control.  check labs.  fax to Dr Rollene Fare. His updated medication list for this problem includes:    Vytorin 10-20 Mg Tabs (Ezetimibe-simvastatin) .Marland Kitchen... Take one-half tablet daily  Orders: TLB-Lipid Panel (80061-LIPID) TLB-Hepatic/Liver Function Pnl  (80076-HEPATIC) Specimen Handling (99000)  Problem # 4:  HYPOTHYROIDISM (ICD-244.9) Assessment: Unchanged due for labs.  adjust meds as needed. His updated medication list for this problem includes:    Synthroid 125 Mcg Tabs (Levothyroxine sodium) .Marland Kitchen... Take one tablet daily  Orders: TLB-TSH (Thyroid Stimulating Hormone) (84443-TSH) Specimen Handling (99000)  Complete Medication List: 1)  Plavix 75 Mg Tabs (Clopidogrel bisulfate) .... Take one tablet daily 2)  Carvedilol 25 Mg Tabs (Carvedilol) .... Take one tablet two times a day 3)  Enalapril Maleate 20 Mg Tabs (Enalapril maleate) .... Take one tablet daily 4)  Synthroid 125 Mcg Tabs (Levothyroxine sodium) .... Take one tablet daily 5)  Allopurinol 300 Mg Tabs (Allopurinol) .... Take one tablet daily 6)  Furosemide 80 Mg Tabs (Furosemide) .... Take one tablet three times a day 7)  Terazosin Hcl 2 Mg Caps (Terazosin hcl) .... Take one tablet daily 8)  Amlodipine Besylate 10 Mg Tabs (Amlodipine besylate) .... Take 1 tablet daily 9)  Sodium Bicarbonate 650 Mg Tabs (Sodium bicarbonate) .... Take two tablet two times a day 10)  Aspirin Childrens 81 Mg Chew (Aspirin) .... Take one tablet daily 11)  Novolin N 100 Unit/ml Susp (Insulin isophane human) .... 40 units two times a day 12)  Temazepam 15 Mg Caps (Temazepam) .... Take one tablet daily as needed 13)  Nitroglycerin 0.4  Mg Subl (Nitroglycerin) .... Take 1 tab sl as needed chest pain 14)  Vytorin 10-20 Mg Tabs (Ezetimibe-simvastatin) .... Take one-half tablet daily 15)  Calcitriol 0.5 Mcg Caps (Calcitriol) .... Take one tablet daily 16)  Prodigy No Coding Blood Gluc Strp (Glucose blood) .... Use to test as directed.  disp 3 month supply  Patient Instructions: 1)  Follow up in 3 months to recheck diabetes 2)  If the insulin seems too expensive at [facility]- call and we'll send it to [facility] (not sure what the charges are when compared) 3)  We'll notify you of your lab work 4)  Adjust  your insulin as needed based on what you eat 5)  Call with any questions or concerns 6)  Happy Holidays!!! Prescriptions: TEMAZEPAM 15 MG CAPS (TEMAZEPAM) take one tablet daily as needed  #90 x 0   Entered and Authorized by:   Annye Asa MD   Signed by:   Annye Asa MD on 12/16/2009   Method used:   Print then Give to Patient   RxID:   [id number] NOVOLIN N 100 UNIT/ML SUSP (INSULIN ISOPHANE HUMAN) 40 units two times a day  #1 month x 0   Entered and Authorized by:   Annye Asa MD   Signed by:   Annye Asa MD on 12/16/2009   Method used:   Electronically to        [id number] (retail)       [place], [place]  [address]       Ph: [phone]       Fax: [phone]   RxID:   [id number]    Orders Added: 1)  Venipuncture [id number] 2)  TLB-A1C / Hgb A1C (Glycohemoglobin) [83036-A1C] 3)  TLB-BMP (Basic Metabolic Panel-BMET) 123456 4)  TLB-Lipid Panel [80061-LIPID] 5)  TLB-Hepatic/Liver Function Pnl [80076-HEPATIC] 6)  TLB-TSH (Thyroid Stimulating Hormone) [84443-TSH] 7)  Specimen Handling [99000] 8)  Est. Patient Level IV [id number] 9)  Prescription Created Electronically [id number]

## 2010-02-07 NOTE — Consult Note (Signed)
Summary: [facility]   Imported By: Edmonia James 03/01/2009 10:54:25  _____________________________________________________________________  External Attachment:    Type:   Image     Comment:   External Document

## 2010-02-07 NOTE — Letter (Signed)
Summary: [facility]   Imported By: Phillis Knack 08/03/2009 14:39:29  _____________________________________________________________________  External Attachment:    Type:   Image     Comment:   External Document

## 2010-02-07 NOTE — Op Note (Signed)
Summary: Resection of Transverse and Proximal Descending Colon                    [place]. [facility]  Patient:    [name] [name], [name] [name] Visit Number: [id number] MRN: [id number]          Service Type: SUR Location: [place] 01 Attending Physician:  Oletha Cruel Dictated by:   [name], M.D. Proc. Date: 09/19/00 Admit Date:  09/19/2000   CC:         Dorothyann Peng C. Redmond Pulling, M.D.  James L. Deterding, M.D.  Richard A. Rollene Fare, M.D.  Jim Desanctis, M.D.   Operative Report  PREOPERATIVE DIAGNOSIS:  Carcinoma of transverse colon.  POSTOPERATIVE DIAGNOSIS:  Carcinoma of transverse colon.  OPERATION PERFORMED:  Resection of transverse and proximal descending colon with primary anastomosis.  SURGEON:  [name], M.D.  ASSISTANT:  Orson Ape. Rise Patience, M.D.  ANESTHESIA:  General endotracheal.  INDICATIONS FOR PROCEDURE:  This patient recently had a work-up for GI blood loss and was found to have superficial carcinoma in what appeared to be just proximal to the splenic flexure.  It had been resected colonoscopically and photographed.  KUB had been taken with the colonoscope at the site to identify it.  A couple of metal clips were also left.  DESCRIPTION OF PROCEDURE:  The patient was brought to the operating room having been seen in the holding area.  He had no further questions and was prepared to proceed to surgery.  After satisfactory general endotracheal anesthesia had been obtained, the Foley catheter was placed and the abdomen prepped and draped as a sterile field.  A midline incision was made from just below the xiphoid to just below the umbilicus.  Abdominal exploration revealed no gross abnormalities.  Liver was normal, gallbladder was normal.  No masses were palpable within the colon. The small bowel appeared to be normal.  The kidneys were normal to palpation.  I started by removing some of the omentum starting at the midtransverse  colon and working towards the splenic flexure.  This freed up a little bit of the colon, so we could get it down some and I then started mobilizing the distal descending colon working back proximally towards the splenic flexure using the cautery until we had the splenic flexure completely mobilized and up.  I used metal clips for hemostasis around the splenic flexure, but this was done without any significant bleeding.  Further palpation of the area revealed no clips.  No areas felt abnormal any place in the colon.  We used a C-arm to try to identify a couple of clips and could find no clips in the abdomen with specific attention to the colon, tried to use [place] to isolate different areas of the colon and run the C-arm across it.  Having done this, I thought perhaps the clips had fallen off at some point.  We then elected to proceed with resection choosing an area just about the level of the midcolics and then going around to middescending colon.  The intervening mesentery was divided between clamps and ligated with 2-0 silk.  On either end an [place] and then a Fransisca Kaufmann was placed and the bowel divided.  I opened the bowel off the table and could find no evidence of any polypoid lesions.  Everything appeared to have healed.  I then opened the proximal end of the bowel and looked in it and used a sterile proctoscope and right at  the edge just where we divided the bowel was an area that might have represented an area where a polyp had been removed.  I therefore divided some more mesentery and took another two inches to get beyond this site.  No other lesions could be seen in either end.  I then performed an anastomosis.  Back walls were tacked together with some 3-0 silks and the stapler used to perform the anastomosis using the triangulation technique.  The back wall was done and then two more applications to complete the front wall, producing a nice closure with no tension.  Gloves and  instruments were then changed.  The defect in the mesentery was closed with some 3-0 silks.  The abdomen was checked for hemostasis.  All self-retaining retractors were removed.  We tried to get a specimen no x-ray.  I couldnt see any clips on that.  I used the C-arm again to look for clips and to see that we had not left any behind and could not find any clips other than the two that we had placed in the left upper quadrant for hemostasis but certainly no small clips that might have been placed by the colonoscope.  I felt that we had gotten the area out at least by the anatomical position of the colonoscope and at this point closed.  The abdomen was closed with running #1 Novofil.  The wound was irrigated and the skin closed with staples. The patient tolerated the procedure well.  There were no operative complications.  All counts were correct. Dictated by:   [name], M.D. Attending Physician:  Oletha Cruel DD:  09/19/00 TD:  09/19/00 Job: [id number]  Appended Document: Resection of Transverse and Proximal Descending Colon FINAL DIAGNOSIS    ***MICROSCOPIC EXAMINATION AND DIAGNOSIS***    I. COLON, DESCENDING, PORTION OF SMALL HYPERPLASTIC POLYP AND   BENIGN LYMPH NODES (4).   II. COLON, ADDITIONAL TISSUE FROM PROXIMAL FOCUS OF INFLAMMATION   AND REACTIVE CHANGE CONSISTENT WITH POLYPECTOMY SITE.    COMMENT   I & II. Noted in Part I is a small hyperplastic polyp and in Part   II a focus of inflammation and reactive change consistent with   previous polypectomy site. No evidence of malignancy is noted in   the current material. This case was also reviewed by Dr. Rodney Cruise   who concurs. (HWB:jy) 09/23/00    jy   Date Reported: 09/23/2000 H. Concha Norway, M.D.   *** Electronically Signed Out By HWB ***    Clinical information   CA DESCENDING COLON (WF)    specimen(s) obtained   1: DESCENDING COLON   2: ADDITIONAL TISSUE FROM PROXIMAL    Gross  Description   I. Received in formalin is a segment of descending colon that   has been previously opened. The specimen measures 16 cm in   length x 7 cm in circumference. The serosal surface is smooth   and glistening. The mucosal surface centrally shows a 0.2 cm   polyp and additional lesions are not noted. Four ovoid segments   identified as lymph nodes are dissected from the mesentery. The   lymph nodes vary from 0.2 to 0.4 cm in greatest dimension and   none show gross evidence of metastatic tumor. Representative   sections are submitted in four cassettes labeled as follows:   A = Shave sections of proximal margin.   B = Shave sections of distal margin.   C = Small polyp.  D = Mesenteric lymph nodes.    II. Received in formalin is a segment of bowel that measures 2.5   cm in length x 7cm in circumference. The serosal surface shows   an attached suture. The mucosal surface opposite the suture   shows a transverse slightly depressed linear defect that measures   0.5 cm in length and on sectioning appears to be confined to the   mucosa. The defect is located 0.7 cm from the narrowest margin.   Additional mucosal lesions are not noted. Mesenteric lymph nodes   are not identified. Representative sections are submitted in   three cassettes labeled as follows:   A = Shave sections of narrowest margin.   B = Shave sections of margin opposite A.   C = Cross sections of mucosal defect. (JBM:caf 09/19/00)    cf/

## 2010-02-07 NOTE — Progress Notes (Signed)
Summary: discuss lab  Phone Note Outgoing Call   Summary of Call: Please have [name] [name] return this week for IFOB, CBC, Anemia panel (280.0) Thanks. Initial call taken by: Nance Pear FNP,  February 18, 2009 11:44 PM  Follow-up for Phone Call        left message on machine .Marland KitchenMarland KitchenMalachi Bonds  February 22, 2009 11:41 AM   spoke w/ patient says he is aware of anemia and Dr. [name] has been taking care of this .Marland KitchenMarland KitchenMarland KitchenMalachi Bonds  February 22, 2009 12:28 PM   New Problems: ANEMIA, IRON DEFICIENCY (ICD-280.9)   New Problems: ANEMIA, IRON DEFICIENCY (ICD-280.9)

## 2010-02-07 NOTE — Letter (Signed)
Summary: [facility]  [facility]   Imported By: Edmonia James 06/15/2009 10:15:08  _____________________________________________________________________  External Attachment:    Type:   Image     Comment:   External Document

## 2010-02-07 NOTE — Letter (Signed)
Summary: [facility]   Imported By: Phillis Knack 08/03/2009 14:40:27  _____________________________________________________________________  External Attachment:    Type:   Image     Comment:   External Document

## 2010-02-07 NOTE — Op Note (Signed)
Summary: ENDOSCOPY (Dr Lajoyce Corners)    NAME:  [name] [name], [name] [name]                          ACCOUNT NO.:  [id number]   MEDICAL RECORD NO.:  [id number]                   PATIENT TYPE:  AMB   LOCATION:  ENDO                                 FACILITY:  [facility]   PHYSICIAN:  Waverly Ferrari, M.D.                 DATE OF BIRTH:  31-Jul-1940   DATE OF PROCEDURE:  DATE OF DISCHARGE:                                 OPERATIVE REPORT   PROCEDURE:  Upper endoscopy.   INDICATIONS FOR PROCEDURE:  Gastric ulcer with bleeding.   ANESTHESIA:  Demerol 80, Versed 10 mg.   DESCRIPTION OF PROCEDURE:  With the patient mildly sedated in the left  lateral decubitus position, the Olympus videoscopic endoscope was inserted  in the mouth and passed under direct vision through the esophagus, which  appeared normal. Other than the distal esophagus, there was a question of a  tongue of Barrett's esophagus, which was photographed and biopsied. We  entered into the stomach. The fundus, body, antrum, duodenal bulb and second  portion of the duodenum all were visualized and no abnormalities were noted.  From this point, the endoscope was slowly withdrawn taking circumferential  views of the duodenal mucosa until the endoscope was then pulled back in the  stomach, placed in retroflexion to view the stomach from below. The  endoscope was then straightened and withdrawn taking circumferential views  of the remaining gastric and esophageal mucosa. The patient's vital signs  and pulse oximeter remained stable. The patient tolerated the procedure well  without apparent complications.   FINDINGS:  Question of Barrett's esophagus biopsied, await biopsy report.  The patient will call me for results and followup with me as an outpatient.                                               Waverly Ferrari, M.D.    GMO/MEDQ  D:  09/07/2002  T:  09/07/2002  Job:  [id number]  FINAL DIAGNOSIS    ***MICROSCOPIC EXAMINATION AND DIAGNOSIS***   ESOPHAGUS, BIOPSY, BARRETT'S: BENIGN SQUAMOUS AND GASTRIC-TYPE   MUCOSA ASSOCIATED WITH MILD INFLAMMATORY CHANGE, NO INTESTINAL   METAPLASIA OR DYSPLASIA IDENTIFIED.    COMMENT   An Alcian Blue stain is performed to determine the presence of   intestinal metaplasia. No intestinal metaplasia is identified   with the Alcian Blue stain. The control stained appropriately.   (TAZ:caf 09/08/02)    cf   Date Reported: 09/08/2002 Valente David, MD   *** Electronically Signed Out By TAZ ***    Clinical information   R/o Barrett' s. (ac)    specimen(s) obtained   Esophagus, biopsy, Barrett's    Gross Description   Received in formalin are tan, soft tissue fragments that are  submitted in toto. Number: 3   Size: 0.1 to 0.3 cm JBM:Mw 09-07-02    mw/

## 2010-02-07 NOTE — Letter (Signed)
Summary: [facility]   Imported By: Phillis Knack 08/03/2009 14:36:47  _____________________________________________________________________  External Attachment:    Type:   Image     Comment:   External Document

## 2010-02-07 NOTE — Letter (Signed)
Summary: [facility]   Imported By: Phillis Knack 08/03/2009 14:45:29  _____________________________________________________________________  External Attachment:    Type:   Image     Comment:   External Document

## 2010-02-07 NOTE — Miscellaneous (Signed)
  Clinical Lists Changes  Observations: Added new observation of PODIATRIST: Dr. Idelle Jo (02/23/2009 15:10) Added new observation of DIABFOOTDPM: no diabetic findings (02/23/2009 15:10)         Diabetes Management Exam:    Foot Exam by Podiatrist:       Date: 02/23/2009       Results: no diabetic findings       Done by: Dr. Idelle Jo

## 2010-02-07 NOTE — Letter (Signed)
Summary: [facility]  [facility]   Imported By: Edmonia James 12/10/2009 10:13:48  _____________________________________________________________________  External Attachment:    Type:   Image     Comment:   External Document

## 2010-02-07 NOTE — Letter (Signed)
Summary: [facility]   Imported By: Edmonia James 07/22/2009 10:53:48  _____________________________________________________________________  External Attachment:    Type:   Image     Comment:   External Document

## 2010-02-07 NOTE — Op Note (Signed)
Summary: COLON (Dr Lajoyce Corners)    [name] [name]:  AADESH, GRANADOS                           ACCOUNT NO.:  [id number]   MEDICAL RECORD NO.:  [id number]                   PATIENT TYPE:  AMB   LOCATION:  ENDO                                 FACILITY:  [facility]   PHYSICIAN:  Waverly Ferrari, M.D.                 DATE OF BIRTH:  04/08/1940   DATE OF PROCEDURE:  DATE OF DISCHARGE:                                 OPERATIVE REPORT   PROCEDURE:  Colonoscopy.   ANESTHESIA:  Demerol 70, Versed 6 mg.   PREP:  Bisacodyl tablets and the prep was good.   INDICATIONS FOR PROCEDURE:  [name] is a 70 year old gentleman with colon  cancer resected a year ago who presents for evaluation of diarrhea which  actually antedated the onset of his cancer. The patient complains of 2-3  loose bowel movements a day somewhat better on the Imodium use. On a number  of medications that could cause diarrhea and we have referred him to PharmD.  He has not as yet availed himself of that.   DESCRIPTION OF PROCEDURE:  With the patient mildly sedated in the left  lateral decubitus position, a rectal exam was performed which was  unremarkable. Subsequently, the Olympus videoscopic colonoscope was inserted  in the rectum and passed under direct vision to the cecum identified by the  ileocecal valve and appendiceal orifice. From this point, the colonoscope  was slowly withdrawn taking circumferential views of the entire colonic  mucosa stopping only then in the rectum which appeared normal in direct and  retroflexed view. The endoscope was straightened and withdrawn. The  patient's vital signs and pulse oximeter remained stable. The patient  tolerated the procedure well without apparent complications.   FINDINGS:  This is an unremarkable colonoscopic examination one year post  resection of left sided colon cancer.   PLAN:  Have the patient follow-up with me for further evaluation.      Waverly Ferrari, M.D.    GMO/MEDQ  D:  08/14/2001  T:  08/17/2001  Job:  [id number]   cc:   Jeneen Rinks L. Deterding, M.D.   Stanley C. Redmond Pulling, M.D.   [name], M.D.  Fax: [phone]   Richard A. Rollene Fare, M.D.

## 2010-02-07 NOTE — Letter (Signed)
Summary: [facility]   Imported By: Phillis Knack 08/03/2009 14:41:47  _____________________________________________________________________  External Attachment:    Type:   Image     Comment:   External Document

## 2010-02-07 NOTE — Op Note (Signed)
Summary: ENDOSCOPY                    [place]. [facility]  Patient:    OLAWALE, [name] [name]                       MRN: [id number] Proc. Date: 08/06/00 Adm. Date:  [id number] Attending:  Jim Desanctis CC:         Joyice Faster. Deterding, M.D.  Stanley C. Redmond Pulling, M.D.  Richard A. Rollene Fare, M.D.   Procedure Report  PROCEDURE PERFORMED:  Upper endoscopy.  ENDOSCOPIST:  Jim Desanctis, M.D.  INDICATIONS FOR PROCEDURE:  Iron deficiency anemia, heme positive stools.  ANESTHESIA:  Demerol 75 mg, Versed 6 mg.  DESCRIPTION OF PROCEDURE:  With the patient mildly sedated in the left lateral decubitus position, the Olympus video endoscope was inserted in the mouth and passed under direct vision through the esophagus, which appeared normal, into the stomach.  The fundus and body appeared normal as did the antrum.  However, there were blood flecks seen in the antrum and this was photographed.  We entered into the duodenal bulb and it was quite edematous and erythematous consistent with fairly moderately severe duodenitis.  We were able to pass through the edematous folds into the second portion of the duodenum which appeared normal.  We slowly withdrew the endoscope taking circumferential views of the duodenal mucosa and no clear ulcer was seen despite our careful looking.  The endoscope was then pulled back into the stomach and placed on retroflexion to view the stomach from below.  The endoscope was then straightened and withdrawn taking circumferential views of the entire gastric and esophageal mucosa, stopping only to take a biopsy for Helicobacter pylori from the antrum of the stomach.  The remainder of the stomach and esophagus appeared normal.  Patients vital signs and pulse oximeter remained stable. The patient tolerated the procedure well without apparent complications.  FINDINGS:  Blood flecks and streaks in the stomach presumably from the moderately severe duodenitis that was noted.   Helicobacter pylori study obtained.  PLAN:  Will treat according to results of Helicobacter pylori study, but the patient will probably benefit by proton pump inhibitor therapy.  Will proceed to colonoscopy as planned and have patient follow up with me as an outpatient. DD:  08/06/00 TD:  08/06/00 Job: [id number]

## 2010-02-07 NOTE — Letter (Signed)
Summary: [facility]   Imported By: Edmonia James 04/15/2009 12:01:17  _____________________________________________________________________  External Attachment:    Type:   Image     Comment:   External Document

## 2010-02-07 NOTE — Discharge Summary (Signed)
Summary: Gallbladder Disease   NAME:  [name] [name], [name] [name]                ACCOUNT NO.:  [id number]      MEDICAL RECORD NO.:  [id number]          PATIENT TYPE:  INP      LOCATION:  3303                         FACILITY:  [facility]      PHYSICIAN:  Fenton Malling. Lucia Gaskins, M.D.  DATE OF BIRTH:  May 01, 1940      DATE OF ADMISSION:  10/28/2006   DATE OF DISCHARGE:                                  DISCHARGE SUMMARY      REASON FOR CONSULTATION:  Possible gallbladder disease.      HISTORY OF PRESENT ILLNESS:  [name] [name] is a fairly complex 70 year old   white male, who is a primary patient of Dr. Ihor Gully, who was   admitted on October 28, 2006 for spinal stenosis by Dr. Newman Pies.   He underwent decompressive laminectomies in nerve roots of L4 and L5   with titanium pedicle screws and auto bone graft placed.      About 3-4 days postop apparently, he had an increasing abdominal   distention.  He has had a NG tube in for approximately the last 10 days.      He has been seen by Dr. Jim Desanctis from a GI standpoint.  He had a CT   scan today, in which there was distention in the gallbladder with   moderate surrounding inflammation.  This then precipitated an ultrasound   of his gallbladder.  His gallbladder ultrasound, however, was less   impressive with a distended sludge-filled gallbladder, borderline   thickening but no other stigmata of acute cholecystitis.      I reviewed the ultrasounds and CT scans with Dr. Markus Daft.  Actually,   which is interesting, is on a CT scan of his abdomen, there is actually   stranding in both retroperitoneums, more so on the right than left.   This seems to be a process well outside just the limits of the   gallbladder, whose etiology is unclear.  Dr. Anselm Pancoast raised the question of   whether something wrong with the appendix, but of note, [name] [name] has   had an appendectomy as a teenager.      From a GI standpoint, [name] has had no history of peptic ulcer   disease or liver disease or known gallbladder disease, though he has had   some prior history of fatty food intolerance according to his wife.  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 Neldon Mc on September 19, 2000.  He has had followup exams by Dr. Jim Desanctis.  Most recently   he had a colonoscopy on June 14, 2004, but it was an unremarkable exam at   that time.      The patient has actually had a NG tube, but he did have a normal bowel   movement today.  He has been able to get up and walk around in the 3300   unit.      CURRENT  MEDICATIONS:  He is on:   1. Vancomycin and Zosyn and erythromycin as antibiotics he has taken.   2. Coreg.   3. Hytrin.   4. Norvasc.   5. Protonix.   6. Lasix.   7. NovoLog.   8. Zyloprim.   9. Synthroid.      REVIEW OF SYSTEMS:  NEUROLOGIC:  He had a stroke in 1998.  He was   hospitalized at [facility], but has pretty much recovered from   that.   PULMONARY:  He has had no significant pulmonary disease, pneumonia.  He   does not smoke cigarettes.   CARDIAC:  He has had a cardiac bypass twice, first in 1992 by Dr.   Merleen Nicely, most recently in 2006 by Dr. Tharon Aquas Trigt.  He sees   Dr. Terance Ice from a cardiology standpoint and has been followed   by him.   GASTROINTESTINAL:  See history of present illness.   UROLOGIC:  He has had no history of kidney stones or kidney infections.   He has had borderline renal failure.  He sees Dr. Jeneen Rinks Deterding as an   outpatient.  He said this all started even before his first bypass.  I   think he has been seen by Dr. Edrick Oh, at least in the hospital from   a renal standpoint, where his creatinine looks like it has bounced   around 2 to 2.5 range.   ENDOCRINE:  He is a type 2 diabetic.  He is on thyroid medicine   replacement for hypothyroidism.      He has a history of gout.      PHYSICAL  EXAMINATION:  VITAL SIGNS:  His temperature is 97.5, heart rate   65, blood pressure 115/41.   GENERAL:  He is a well-nourished, balding white male, alert,   cooperative, fell asleep during the exam a couple of times.  He has a NG   tube in place.   NECK:  Supple without mass or thyromegaly.   LUNGS:  Clear to auscultation with symmetric breath sounds.   HEART:  Has a regular rate and rhythm.  I hear no murmur or rub.   ABDOMEN:  Distended.  He has a well-healed midline scar.  He has a well-   healed right lower quadrant scar.  He has some soreness and tenderness   in the right upper quadrant, but no real bad guarding or rebound.  He   actually has active bowel sounds.  I did not do a rectal exam on him.  I   feel no hernia, no mass.  Again, he is fairly distended male, who   probably has a pretty large abdomen anyway.   EXTREMITIES:  He has good strength in the upper and lower extremities.   NEUROLOGICAL:  Grossly intact.      LABORATORY DATA:  Labs that I have show a sodium of 140, potassium 3.9,   chloride 110, glucose 186, BUN 49, creatinine 1.7.  His total protein is   5.2, albumin 2.2, his pre-albumin 8.2, total cholesterol 52.      His hemoglobin is 8.5, hematocrit 25, white blood count of 32,000.      His CT scan and ultraosund I have already commented on and reviewed with   that of [facility].      IMPRESSION:   1. Probable gallbladder disease.  However, I think for completeness I       think he would be best served  by having a hepatobiliary scan.  If       the hepatobiliary scan is positive, in other words showing cystic       duct obstruction, then the question in this gentleman is he better       served with surgery versus percutaneous drain.  This finding would       need to be a carried out discussion probably with his neurosurgeon,       Dr. Arnoldo Morale, and may his cardiologist.   The family is concerned about him being put to sleep again.  I  tried to   address some of their  fears about this.   1. Elevated white blood count 32,000, which seems a little out of       proportion to what I am finding by physical exam and on his       ultrasound of his gallbladder and wonder whether he has another       source of infection.   2. Positive blood culture with Staphylococcus aureus.  This would be       an unusual bacteria to be of gallbladder origin and these were done       on 2 blood cultures on the 31st.  These are methicillin-sensitive       Staph aureus.   3. Status post laminectomy, which apparently he has doing well.  He       has gotten up and walked and says his back pain is less.   4. Type 2 diabetes.   5. He is on TPN with a preop being made 0.2.   6. Chronic renal insufficiency with a creatinine of 1.76, a BUN of 49,       followed by Dr. [name] in renal.   8 . Coronary artery disease, which appears to be doing pretty well since   he has been in the hospital.  He has been followed by Dr. Terance Ice for this.   1. History of stroke, which has resolved with no further neurologic       problems.   2. History of colon cancer with no evidence of disease at this time.               Fenton Malling. Lucia Gaskins, M.D.   Electronically Signed            DHN/MEDQ  D:  11/12/2006  T:  11/12/2006  Job:  [id number]      cc:   Ophelia Charter, M.D.   Leonel Ramsay, MD   Waverly Ferrari, M.D.   James L. Deterding, M.D.   Nelda Severe Juventino Slovak, M.D.   Richard A. Rollene Fare, M.D.   ADDENDUM      This is an addendum to discharge summary dictated by Dr. Lucia Gaskins on   November 12, 2006.      FINAL DISCHARGE DIAGNOSES:   1. Cholecystitis status post percutaneous drain.   2. Status post respiratory failure.   3. Ileus which is resolved.   4. Chronic renal failure.   5. Spinal stenosis status post decompression.   6. Staph aureus bacteremia.   7. Diabetes mellitus.   8. Coronary artery disease.      DISCHARGE MEDICATIONS:   1. Ancef 2 gm IV q.8 hours to be  continued until December 04, 2006.   2. Flagyl 500 mg three times daily until December 04, 2006.   3. Lasix 40 mg twice daily.   4. Plavix 75 mg  daily.   5. NPH insulin 50 units twice daily.   6. Vytorin 10/40 daily.   7. Terazosin 2 mg daily.   8. Enalapril 20 mg twice daily.   9. Coreg 25 mg twice daily.   10.Synthroid 0.125 mg daily.   11.Allopurinol 300 mg daily.   12.Norvasc 10 mg daily.      HOSPITAL COURSE:  For complete hospital course, see the last discharge   summary dictated by Dr. Lucia Gaskins on September 12, 2006.  Since then, Mr.   Guiden had cholecystostomy and drain placed for his gallbladder disease.   He went into bradycardia and apnea for which he was intubated and was   sent to the ICU.  His blood cultures grew staph aureus which is   sensitive to oxacillin and he is on Unasyn.  At this time, the plan is   to treat him for two more weeks with Unasyn and Flagyl.  He also had E.   coli growing from his gallbladder which will be covered by Ancef.  The   Flagyl will cover the anaerobes.  He was put on TPN for protein calorie   malnutrition and at this time, he is off his TNA.  This patient was   admitted under the neurosurgery service, but was transferred to [place] a   week after the patient was transferred out of the ICU.  At this time,   the patient is stable, in no acute distress, in a stable condition.      FOLLOWUP:  He has to follow up with Dr. Arnoldo Morale in one to two weeks and   he will be following up with Dr. Excell Seltzer in four weeks.  He will also   follow up with the primary care doctor in one to two weeks.               Vladimir Faster, MD   Electronically Signed            PKN/MEDQ  D:  11/20/2006  T:  11/20/2006  Job:  [id number]

## 2010-02-07 NOTE — Progress Notes (Signed)
Summary: Phone  Phone Note Outgoing Call   Call placed by: Silva Bandy,  February 11, 2009 2:15 PM Call placed to: Patient Summary of Call: PER MELISSA.......Marland Kitchenhe is due for a ROV, please arrange it w/ Melissa   PATIENT HAS AN APPT ON FEB 8,2011.Marland KitchenMarland KitchenMarland KitchenSilva Bandy  February 11, 2009 2:16 PM   Initial call taken by: Silva Bandy,  February 11, 2009 2:16 PM

## 2010-02-07 NOTE — Op Note (Signed)
Summary: COLONOSCOPY    NAME:  RODRIGO, PEZZULLO NO.:  [id number]   MEDICAL RECORD NO.:  [id number]                   PATIENT TYPE:  AMB   LOCATION:  ENDO                                 FACILITY:  [facility]   PHYSICIAN:  Waverly Ferrari, M.D.                 DATE OF BIRTH:  1940-02-05   DATE OF PROCEDURE:  2020-06-2602  DATE OF DISCHARGE:                                 OPERATIVE REPORT   PROCEDURE:  Colonoscopy.   INDICATIONS:  Hemoccult positivity.   ANESTHESIA:  Demerol 10 mg, Versed 2 mg.   PROCEDURE:  With the patient mildly sedated in the left lateral decubitus  position, the Olympus videoscopic colonoscope was inserted in the rectum and  passed under direct vision to the cecum, identified by ileocecal valve and  appendiceal orifice, both of which were photographed.  From this point the  colonoscope was slowly withdrawn, taking circumferential views of the entire  colonic mucosa, stopping only in the rectum, which appeared normal on direct  and retroflexed view.  The endoscope was straightened and withdrawn.  The  patient's vital signs and pulse oximetry remained stable.  The patient  tolerated the procedure well without apparent complications.   FINDINGS:  This was an unremarkable colonoscopic examination to the cecum.   PLAN:  See endoscopy note for further details.                                               Waverly Ferrari, M.D.    GMO/MEDQ  D:  2020-06-2602  T:  2020-06-2602  Job:  [id number]   cc:   Jeneen Rinks L. Deterding, M.D.  [place]  [place] [address]  Fax: [phone]

## 2010-02-07 NOTE — Letter (Signed)
Summary: [facility]   Imported By: Edmonia James 04/08/2009 09:27:28  _____________________________________________________________________  External Attachment:    Type:   Image     Comment:   External Document

## 2010-02-07 NOTE — Op Note (Signed)
Summary: COLON (Dr Lajoyce Corners)                    [facility]  Patient:    [name] [name], [name] [name]                       MRN: [id number] Adm. Date:  [id number] Attending:  Jim Desanctis CC:         [name], M.D.   Procedure Report  PROCEDURE:  Colonoscopy.  INDICATIONS:  Colon cancer, previously resected.  This procedure was done to find the area of resection of the malignant polyp so in order to mark it for surgical resection.  ANESTHESIA:  Demerol 100 mg, Versed 10 mg.  DESCRIPTION OF PROCEDURE:  With patient mildly sedated in the left lateral decubitus position, the Olympus videoscopic colonoscope was inserted into the rectum and passed under direct vision to the area of ulceration where we had previously resected a malignant polyp.  This area was then photographed, and subsequently we marked this area with two clips that we attached adjacent to the ulcerated area.  This was done with C-arm guidance, and we will get a portable film subsequently to localize the scope and the clips so that we can have a more accurate visualization of the area in question that needs to be resected.  From this point the colonoscope was then slowly withdrawn, taking circumferential views of the remaining colonic mucosa.  The patients vital signs and pulse oximetry remained stable.  The patient tolerated the procedure well with no apparent complications.  FINDINGS:  Ulceration at previous biopsy site that is marked for resection. DD:  08/13/00 TD:  08/14/00 Job: [id number]

## 2010-02-07 NOTE — Letter (Signed)
Summary: [facility]   Imported By: Edmonia James 02/04/2009 09:01:39  _____________________________________________________________________  External Attachment:    Type:   Image     Comment:   External Document  Appended Document: [facility] he is due for a ROV, please arrange it w/ Lenna Sciara

## 2010-02-07 NOTE — Letter (Signed)
Summary: [facility]   Imported By: Phillis Knack 08/03/2009 14:42:39  _____________________________________________________________________  External Attachment:    Type:   Image     Comment:   External Document

## 2010-02-07 NOTE — Progress Notes (Signed)
Summary: change in med  Phone Note Outgoing Call   Summary of Call: Please call [name] [name] and let him know that we can decrease his vytorin to 10/20 one half tablet by mouth daily.  He should return for f/u FLP in 3 months.   Initial call taken by: Nance Pear FNP,  February 16, 2009 9:07 AM  Follow-up for Phone Call        information for prescription called to [facility] patient aware of instructions asked about A1C informed not checked but will request add-on from the lab.[name]  February 16, 2009 4:07 PM     New/Updated Medications: VYTORIN 10-20 MG TABS (EZETIMIBE-SIMVASTATIN) take one-half tablet daily Prescriptions: VYTORIN 10-20 MG TABS (EZETIMIBE-SIMVASTATIN) take one-half tablet daily  #45 x 1   Entered by:   [name]   Authorized by:   Nance Pear FNP   Signed by:   [name] on 02/16/2009   Method used:   Historical   [id number]

## 2010-02-07 NOTE — Letter (Signed)
   [place], [place] [address] [phone]    February 18, 2009   QUINTYN KASSIM [address] [place], [place] [address]  RE:  LAB RESULTS  Dear  Mr. LORONA,  The following is an interpretation of your most recent lab tests.  Please take note of any instructions provided or changes to medications that have resulted from your lab work.  ELECTROLYTES:  Good - no changes needed  KIDNEY FUNCTION TESTS:  Stable - no changes needed   THYROID STUDIES:  Thyroid studies normal TSH: 0.94     DIABETIC STUDIES:  Good - no changes needed Blood Glucose: 82   HgbA1C: 6.5   Microalbumin/Creatinine Ratio: 72.2     CBC:  Fair - review at your next visit   Sincerely Yours,    Nance Pear FNP

## 2010-02-07 NOTE — Medication Information (Signed)
Summary: [facility]  [facility]lies/[facility]   Imported By: Edmonia James 12/15/2009 13:04:42  _____________________________________________________________________  External Attachment:    Type:   Image     Comment:   External Document

## 2010-02-07 NOTE — Assessment & Plan Note (Signed)
Summary: RTO 3 MONTHS/CBS   Vital Signs:  Patient profile:   70 year old male Weight:      187 pounds Pulse rate:   54 / minute BP sitting:   110 / 58  (left arm)  Vitals Entered By: [name] CMA (September 16, 2009 10:59 AM) CC: 3 month roa    History of Present Illness: 70 yo man here today for  1) DM- has eye exam 9/20.  CBGs 'good'- 109 this AM.  denies symptomatic lows.    2) HTN- less dizziness since decreasing amlodipine.  no CP, SOB, some edema (due to kidney failure), HAs, visual changes.  3) Renal Failure- having oligouria, has appt w/ Dr [name] 9/29.  still in 'holding pattern' on HD plans.  4) Abd pain- RUQ pain last week, now resolved.  'felt like gallbladder pain but i don't have a gallbladder'.  pt woke up after 5 days and pain was gone.  no N/V.  ongoing diarrhea (this is not new)  Current Medications (verified): 1)  Plavix 75 Mg Tabs (Clopidogrel Bisulfate) .... Take One Tablet Daily 2)  Carvedilol 25 Mg Tabs (Carvedilol) .... Take One Tablet Two Times A Day 3)  Enalapril Maleate 20 Mg Tabs (Enalapril Maleate) .... Take One Tablet Daily 4)  Synthroid 125 Mcg Tabs (Levothyroxine Sodium) .... Take One Tablet Daily 5)  Allopurinol 300 Mg Tabs (Allopurinol) .... Take One Tablet Daily 6)  Furosemide 80 Mg Tabs (Furosemide) .... Take One Tablet Three Times A Day 7)  Terazosin Hcl 2 Mg Caps (Terazosin Hcl) .... Take One Tablet Daily 8)  Amlodipine Besylate 10 Mg Tabs (Amlodipine Besylate) .... Take 1 Tablet Daily 9)  Sodium Bicarbonate 650 Mg Tabs (Sodium Bicarbonate) .... Take Two Tablet Two Times A Day 10)  Aspirin Childrens 81 Mg Chew (Aspirin) .... Take One Tablet Daily 11)  Novolin N 100 Unit/ml Susp (Insulin Isophane Human) .... 40 Units Two Times A Day 12)  Temazepam 15 Mg Caps (Temazepam) .... Take One Tablet Daily As Needed 13)  Nitroglycerin 0.4 Mg Subl (Nitroglycerin) .... Take 1 Tab Sl As Needed Chest Pain 14)  Vytorin 10-20 Mg Tabs  (Ezetimibe-Simvastatin) .... Take One-Half Tablet Daily 15)  Calcitriol 0.5 Mcg Caps (Calcitriol) .... Take One Tablet Daily 16)  Prodigy No Coding Blood Gluc  Strp (Glucose Blood) .... Use To Test As Directed.  Disp 3 Month Supply  Allergies (verified): No Known Drug Allergies  Past History:  Past Medical History: Last updated: 08/17/2009 Diabetes mellitus, type II Gout Hyperlipidemia Hypertension Hypothyroidism Low back pain- s/p steroid injxn Colon cancer, hx of 2003 Cerebrovascular accident, hx of Stage 3 chronic kidney disease Anemia Stroke  Past Surgical History: Last updated: 08/17/2009 Appendectomy Coronary artery bypass graft x2 Cholecystectomy Bilateral Cataracts removed Spinal fusion AV fistula 5/11 Resection of transverse and proximal descending colon with primary anastomosis  2003  Review of Systems      See HPI  Physical Exam  General:  Well-developed,well-nourished,in no acute distress; alert,appropriate and cooperative throughout examination Neck:  No deformities, masses, or tenderness noted. Lungs:  Normal respiratory effort, chest expands symmetrically. Lungs are clear to auscultation, no crackles or wheezes. Heart:  + grade 3 systolic murmur Abdomen:  soft, distended, ? mild ascites, no TTP, no rebound/guarding. Pulses:  +2 cartoid, radial +1 DP bilaterally good pulse in AV fistula at L elbow Extremities:  1+ bilateral lower extremity edema  Psych:  Oriented X3, memory intact for recent and remote, normally interactive, good eye  contact, not anxious appearing, and not depressed appearing.     Impression & Recommendations:  Problem # 1:  DIABETES MELLITUS, TYPE II (ICD-250.00) Assessment Unchanged due for labs.  adjust meds as needed. His updated medication list for this problem includes:    Enalapril Maleate 20 Mg Tabs (Enalapril maleate) .Marland Kitchen... Take one tablet daily    Aspirin Childrens 81 Mg Chew (Aspirin) .Marland Kitchen... Take one tablet daily     Novolin N 100 Unit/ml Susp (Insulin isophane human) .Marland KitchenMarland KitchenMarland KitchenMarland Kitchen 40 units two times a day  Orders: Specimen Handling (99000) TLB-A1C / Hgb A1C (Glycohemoglobin) (83036-A1C)  Problem # 2:  HYPERTENSION (ICD-401.9) Assessment: Unchanged BP low but he is asymptomatic.  will not make changes at this time but continue to follow. His updated medication list for this problem includes:    Carvedilol 25 Mg Tabs (Carvedilol) .Marland Kitchen... Take one tablet two times a day    Enalapril Maleate 20 Mg Tabs (Enalapril maleate) .Marland Kitchen... Take one tablet daily    Furosemide 80 Mg Tabs (Furosemide) .Marland Kitchen... Take one tablet three times a day    Terazosin Hcl 2 Mg Caps (Terazosin hcl) .Marland Kitchen... Take one tablet daily    Amlodipine Besylate 10 Mg Tabs (Amlodipine besylate) .Marland Kitchen... Take 1 tablet daily  Problem # 3:  ABDOMINAL PAIN, RIGHT UPPER QUADRANT (ICD-789.01) Assessment: New pain has resolved per pt's report.  PE normal but ? ascites- pt reports he's 'retaining fluid'.  abd is not tender.  check labs to r/o infxn.  will follow closely. Orders: Venipuncture [id number]) Specimen Handling (99000) TLB-BMP (Basic Metabolic Panel-BMET) (99991111) TLB-Hepatic/Liver Function Pnl (80076-HEPATIC) TLB-CBC Platelet - w/Differential (85025-CBCD)  Problem # 4:  RENAL INSUFFICIENCY, CHRONIC (ICD-585.9) Assessment: Unchanged pt 'has the parts in place' to start HD.  uncertain when this will occur.  will follow along.  Complete Medication List: 1)  Plavix 75 Mg Tabs (Clopidogrel bisulfate) .... Take one tablet daily 2)  Carvedilol 25 Mg Tabs (Carvedilol) .... Take one tablet two times a day 3)  Enalapril Maleate 20 Mg Tabs (Enalapril maleate) .... Take one tablet daily 4)  Synthroid 125 Mcg Tabs (Levothyroxine sodium) .... Take one tablet daily 5)  Allopurinol 300 Mg Tabs (Allopurinol) .... Take one tablet daily 6)  Furosemide 80 Mg Tabs (Furosemide) .... Take one tablet three times a day 7)  Terazosin Hcl 2 Mg Caps (Terazosin hcl) ....  Take one tablet daily 8)  Amlodipine Besylate 10 Mg Tabs (Amlodipine besylate) .... Take 1 tablet daily 9)  Sodium Bicarbonate 650 Mg Tabs (Sodium bicarbonate) .... Take two tablet two times a day 10)  Aspirin Childrens 81 Mg Chew (Aspirin) .... Take one tablet daily 11)  Novolin N 100 Unit/ml Susp (Insulin isophane human) .... 40 units two times a day 12)  Temazepam 15 Mg Caps (Temazepam) .... Take one tablet daily as needed 13)  Nitroglycerin 0.4 Mg Subl (Nitroglycerin) .... Take 1 tab sl as needed chest pain 14)  Vytorin 10-20 Mg Tabs (Ezetimibe-simvastatin) .... Take one-half tablet daily 15)  Calcitriol 0.5 Mcg Caps (Calcitriol) .... Take one tablet daily 16)  Prodigy No Coding Blood Gluc Strp (Glucose blood) .... Use to test as directed.  disp 3 month supply  Patient Instructions: 1)  Please schedule a follow-up appointment in 3 months to recheck diabetes. 2)  We'll notify you of your lab results 3)  Call with any questions or concerns 4)  Have a safe trip to the beach!  Prescriptions: TEMAZEPAM 15 MG CAPS (TEMAZEPAM) take one tablet daily as  needed  #90 x 0   Entered and Authorized by:   Annye Asa MD   Signed by:   Annye Asa MD on 09/16/2009   Method used:   Print then Give to Patient   RxID:   [id number] ALLOPURINOL 300 MG TABS (ALLOPURINOL) take one tablet daily  #90 x 3   Entered and Authorized by:   Annye Asa MD   Signed by:   Annye Asa MD on 09/16/2009   Method used:   Faxed to ...       [facility] (mail-order)             , [place]         Ph: [phone]       Fax: [phone]   [id number] CARVEDILOL 25 MG TABS (CARVEDILOL) take one tablet two times a day  #180 x 3   Entered and Authorized by:   Annye Asa MD   Signed by:   Annye Asa MD on 09/16/2009   Method used:   Faxed to ...       [facility] (mail-order)             , [place]         Ph: [phone]       Fax: [phone]   RxID:   [id number]

## 2010-02-09 NOTE — Progress Notes (Signed)
Summary: labs  Phone Note Outgoing Call   Call placed by: [name] CMA,  December 19, 2009 11:44 AM Call placed to: Patient Summary of Call: diabetes is well controlled.  K+ is elevated at 5.6.  should eat low K+ diet, repeat BMP on Monday or Tuesday.  will fax labs to Dr [facility])  total cholesterol, LDL, and trigs are again excellent.  HDL remains low.  will fax labs to Dr Rollene Fare (cards) and determine if we could switch from Zetia to plain Simvastatin.  Follow-up for Phone Call        left message on machine .........Marland KitchenMalachi Bonds CMA  December 19, 2009 11:45 AM   discuss with patient.............Marland KitchenFelecia Deloach CMA  December 19, 2009 11:56 AM

## 2010-02-09 NOTE — Letter (Signed)
Summary: [facility]   Imported By: Edmonia James 01/24/2010 10:51:50  _____________________________________________________________________  External Attachment:    Type:   Image     Comment:   External Document

## 2010-02-23 ENCOUNTER — Encounter: Payer: Self-pay | Admitting: Family Medicine

## 2010-03-04 ENCOUNTER — Encounter: Payer: Self-pay | Admitting: Family Medicine

## 2010-03-13 ENCOUNTER — Encounter: Payer: Self-pay | Admitting: Family Medicine

## 2010-03-13 ENCOUNTER — Ambulatory Visit (INDEPENDENT_AMBULATORY_CARE_PROVIDER_SITE_OTHER): Payer: Medicare Other | Admitting: Family Medicine

## 2010-03-13 ENCOUNTER — Other Ambulatory Visit: Payer: Self-pay | Admitting: Family Medicine

## 2010-03-13 DIAGNOSIS — E119 Type 2 diabetes mellitus without complications: Secondary | ICD-10-CM

## 2010-03-13 DIAGNOSIS — R079 Chest pain, unspecified: Secondary | ICD-10-CM | POA: Insufficient documentation

## 2010-03-13 DIAGNOSIS — N189 Chronic kidney disease, unspecified: Secondary | ICD-10-CM

## 2010-03-13 LAB — BASIC METABOLIC PANEL
CO2: 26 mEq/L (ref 19–32)
Calcium: 9.5 mg/dL (ref 8.4–10.5)
Chloride: 102 mEq/L (ref 96–112)
Glucose, Bld: 88 mg/dL (ref 70–99)
Sodium: 138 mEq/L (ref 135–145)

## 2010-03-14 ENCOUNTER — Telehealth: Payer: Self-pay | Admitting: Family Medicine

## 2010-03-21 NOTE — Progress Notes (Addendum)
Summary: Paperwork needed  ---- Converted from flag ---- ---- 03/13/2010 10:25 AM, Annye Asa MD wrote: please call [facility] at [phone] and ask them to fax the forms for his diabetic shoes.  pt has called them twice and had no results getting info. ------------------------------  Per their office they will fax [place] the patient paperwork later today or tomorrow. Ernestene Mention CMA  March 14, 2010 11:56 AM   Appended Document: Awaiting paperwork    Phone Note Call from Patient   Caller: Patient Summary of Call: Pt calling to see if we have received paper work about his shoes. Per kelly no paperwork receive advise Pt. Pt request call to verify paperwork received.........Marland KitchenFelecia Deloach CMA  March 20, 2010 3:30 PM     Requested again and Nevin Bloodgood states that she will fax it before the end of the day today. Ernestene Mention CMA  March 20, 2010 3:33 PM

## 2010-03-21 NOTE — Assessment & Plan Note (Signed)
Summary: Recheck diabetes-KN   Vital Signs:  Patient profile:   70 year old male Height:      66.25 inches (168.28 cm) Weight:      186.38 pounds (84.72 kg) BMI:     29.96 Temp:     97.6 degrees F (36.44 degrees C) oral BP sitting:   116 / 50  (right arm) Cuff size:   large  Vitals Entered By: Ernestene Mention CMA (March 13, 2010 9:47 AM) CC: Recheck DM./kb Is Patient Diabetic? Yes Pain Assessment Patient in pain? no      Comments Patient notes that he needs prescription for diabetic shoes, Plavix, Terazosin, Calcitriol, and Enalapril.   History of Present Illness: 70 yo man here today for DM f/u.  reports feeling well.  'i've been a little lax on my sugars'.  CBG 105 this AM.  UTD on eye exam.  some CP- has f/u w/ Dr Rollene Fare.  last chest pain was >1 week ago, often during exertion.  getting carotid dopplers today.  + SOB at times w/ exertion.  some edema due to renal insufficiency.  no N/V.  Current Medications (verified): 1)  Plavix 75 Mg Tabs (Clopidogrel Bisulfate) .... Take One Tablet Daily 2)  Carvedilol 25 Mg Tabs (Carvedilol) .... Take One Tablet Two Times A Day 3)  Enalapril Maleate 20 Mg Tabs (Enalapril Maleate) .... Take One Tablet Daily 4)  Synthroid 125 Mcg Tabs (Levothyroxine Sodium) .... Take One Tablet Daily 5)  Allopurinol 300 Mg Tabs (Allopurinol) .... Take One Tablet Daily 6)  Furosemide 80 Mg Tabs (Furosemide) .... Take One Tablet Three Times A Day 7)  Terazosin Hcl 2 Mg Caps (Terazosin Hcl) .... Take One Tablet Daily 8)  Amlodipine Besylate 10 Mg Tabs (Amlodipine Besylate) .... Take 1 Tablet Daily 9)  Sodium Bicarbonate 650 Mg Tabs (Sodium Bicarbonate) .... Take Two Tablet Two Times A Day 10)  Aspirin Childrens 81 Mg Chew (Aspirin) .... Take One Tablet Daily 11)  Novolin N 100 Unit/ml Susp (Insulin Isophane Human) .... 40 Units Two Times A Day 12)  Temazepam 15 Mg Caps (Temazepam) .... Take One Tablet Daily As Needed 13)  Nitroglycerin 0.4 Mg Subl  (Nitroglycerin) .... Take 1 Tab Sl As Needed Chest Pain 14)  Vytorin 10-20 Mg Tabs (Ezetimibe-Simvastatin) .... Take One-Half Tablet Daily 15)  Calcitriol 0.5 Mcg Caps (Calcitriol) .... Take One Tablet Daily 16)  Prodigy No Coding Blood Gluc  Strp (Glucose Blood) .... Use To Test As Directed.  Disp 3 Month Supply  Allergies (verified): No Known Drug Allergies  Past History:  Past medical, surgical, family and social histories (including risk factors) reviewed, and no changes noted (except as noted below).  Past Medical History: Reviewed history from 08/17/2009 and no changes required. Diabetes mellitus, type II Gout Hyperlipidemia Hypertension Hypothyroidism Low back pain- s/p steroid injxn Colon cancer, hx of 2003 Cerebrovascular accident, hx of Stage 3 chronic kidney disease Anemia Stroke  Past Surgical History: Reviewed history from 08/17/2009 and no changes required. Appendectomy Coronary artery bypass graft x2 Cholecystectomy Bilateral Cataracts removed Spinal fusion AV fistula 5/11 Resection of transverse and proximal descending colon with primary anastomosis  2003  Family History: Reviewed history from 08/17/2009 and no changes required. CAD-mother, father HTN-mother, father Claris Gladden DM-mother, father PROSTATE CA-no No FH of Colon Cancer: Family History of Colon Polyps: daughter  Social History: Reviewed history from 08/17/2009 and no changes required. 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  Alcohol Use - no Illicit Drug  Use - no  Review of Systems      See HPI  Physical Exam  General:  Well-developed,well-nourished,in no acute distress; alert,appropriate and cooperative throughout examination Head:  Normocephalic and atraumatic without obvious abnormalities.  balding. Neck:  No deformities, masses, or tenderness noted. Lungs:  Normal respiratory effort, chest expands symmetrically. Lungs are clear to auscultation, no  crackles or wheezes. Heart:  + grade 3 systolic murmur Abdomen:  soft, NT/ND, +BS Pulses:  +2 cartoid, radial +1 DP bilaterally good pulse in AV fistula at L elbow Extremities:  trace bilateral lower extremity edema    Impression & Recommendations:  Problem # 1:  DIABETES MELLITUS, TYPE II (ICD-250.00) Assessment Unchanged check A1C and adjust meds as needed.  reviewed importance of healthy diet and regular exercise. His updated medication list for this problem includes:    Enalapril Maleate 20 Mg Tabs (Enalapril maleate) .Marland Kitchen... Take one tablet daily    Aspirin Childrens 81 Mg Chew (Aspirin) .Marland Kitchen... Take one tablet daily    Novolin N 100 Unit/ml Susp (Insulin isophane human) .Marland KitchenMarland KitchenMarland KitchenMarland Kitchen 40 units two times a day  Orders: Venipuncture [id number]) TLB-BMP (Basic Metabolic Panel-BMET) (99991111) TLB-A1C / Hgb A1C (Glycohemoglobin) (83036-A1C) Specimen Handling (99000)  Problem # 2:  CHEST PAIN (ICD-786.50) Assessment: New occuring w/ exertion.  not new for pt.  encouraged him to call cards- states he has appt for next week.  pt declines further w/u at this time.  Complete Medication List: 1)  Plavix 75 Mg Tabs (Clopidogrel bisulfate) .... Take one tablet daily 2)  Carvedilol 25 Mg Tabs (Carvedilol) .... Take one tablet two times a day 3)  Enalapril Maleate 20 Mg Tabs (Enalapril maleate) .... Take one tablet daily 4)  Synthroid 125 Mcg Tabs (Levothyroxine sodium) .... Take one tablet daily 5)  Allopurinol 300 Mg Tabs (Allopurinol) .... Take one tablet daily 6)  Furosemide 80 Mg Tabs (Furosemide) .... Take one tablet three times a day 7)  Terazosin Hcl 2 Mg Caps (Terazosin hcl) .... Take one tablet daily 8)  Amlodipine Besylate 10 Mg Tabs (Amlodipine besylate) .... Take 1 tablet daily 9)  Sodium Bicarbonate 650 Mg Tabs (Sodium bicarbonate) .... Take two tablet two times a day 10)  Aspirin Childrens 81 Mg Chew (Aspirin) .... Take one tablet daily 11)  Novolin N 100 Unit/ml Susp (Insulin  isophane human) .... 40 units two times a day 12)  Temazepam 15 Mg Caps (Temazepam) .... Take one tablet daily as needed 13)  Nitroglycerin 0.4 Mg Subl (Nitroglycerin) .... Take 1 tab sl as needed chest pain 14)  Vytorin 10-20 Mg Tabs (Ezetimibe-simvastatin) .... Take one-half tablet daily 15)  Calcitriol 0.5 Mcg Caps (Calcitriol) .... Take one tablet daily 16)  Prodigy No Coding Blood Gluc Strp (Glucose blood) .... Use to test as directed.  disp 3 month supply  Other Orders: Prescription Created Electronically [phone])  Patient Instructions: 1)  Schedule your complete physical for June- do not eat before this appt 2)  Keep up the good work!  You look great! 3)  Please call me (or Dr Rollene Fare) if you continue to have chest pain 4)  Call with any questions or concerns 5)  Happy Spring!!! Prescriptions: TEMAZEPAM 15 MG CAPS (TEMAZEPAM) take one tablet daily as needed  #90 x 3   Entered and Authorized by:   Annye Asa MD   Signed by:   Annye Asa MD on 03/13/2010   Method used:   Print then Give to Patient   RxID:   [id number]  TERAZOSIN HCL 2 MG CAPS (TERAZOSIN HCL) take one tablet daily  #90 x 3   Entered and Authorized by:   Annye Asa MD   Signed by:   Annye Asa MD on 03/13/2010   Method used:   Faxed to ...       [facility] (mail-order)             , [place]         Ph: [phone]       Fax: [phone]   [id number] ENALAPRIL MALEATE 20 MG TABS (ENALAPRIL MALEATE) take one tablet daily  #90 x 3   Entered and Authorized by:   Annye Asa MD   Signed by:   Annye Asa MD on 03/13/2010   Method used:   Faxed to ...       [facility] (mail-order)             , [place]         Ph: [phone]       Fax: [phone]   [id number] PLAVIX 75 MG TABS (CLOPIDOGREL BISULFATE) take one tablet daily  #90 x 3   Entered and Authorized by:   Annye Asa MD   Signed by:   Annye Asa MD on 03/13/2010   Method used:   Faxed to  ...       [facility] (mail-order)             , [place]         Ph: [phone]       Fax: [phone]   [id number] CALCITRIOL 0.5 MCG CAPS (CALCITRIOL) take one tablet daily  #90 x 0   Entered and Authorized by:   Annye Asa MD   Signed by:   Annye Asa MD on 03/13/2010   Method used:   Electronically to        [id number] (retail)       [place], [place]  [address]       Ph: [phone]       Fax: [phone]   RxID:   [id number]    Orders Added: 1)  Venipuncture [id number] 2)  TLB-BMP (Basic Metabolic Panel-BMET) [id number] 3)  TLB-A1C / Hgb A1C (Glycohemoglobin) [83036-A1C] 4)  Specimen Handling [99000] 5)  Est. Patient Level III [id number] 6)  Prescription Created Electronically [phone]

## 2010-03-28 LAB — POCT I-STAT 4, (NA,K, GLUC, HGB,HCT)
HCT: 34 % — ABNORMAL LOW (ref 39.0–52.0)
Hemoglobin: 11.6 g/dL — ABNORMAL LOW (ref 13.0–17.0)
Potassium: 4.4 mEq/L (ref 3.5–5.1)

## 2010-03-28 LAB — GLUCOSE, CAPILLARY: Glucose-Capillary: 139 mg/dL — ABNORMAL HIGH (ref 70–99)

## 2010-04-06 NOTE — Letter (Signed)
Summary: [facility]   Imported By: Laural Benes 03/27/2010 10:07:42  _____________________________________________________________________  External Attachment:    Type:   Image     Comment:   External Document

## 2010-05-05 ENCOUNTER — Encounter: Payer: Self-pay | Admitting: Family Medicine

## 2010-05-23 NOTE — Discharge Summary (Signed)
NAME:  [name] [name], [name] [name] NO.:  [id number]   MEDICAL RECORD NO.:  [id number]          PATIENT TYPE:  INP   LOCATION:  2020                         FACILITY:  [facility]   PHYSICIAN:  Vladimir Faster, MD        DATE OF BIRTH:  20-Apr-1940   DATE OF ADMISSION:  10/28/2006  DATE OF DISCHARGE:  11/20/2006                               DISCHARGE SUMMARY   ADDENDUM   This is an addendum to discharge summary dictated by Dr. Lucia Gaskins on  November 12, 2006.   FINAL DISCHARGE DIAGNOSES:  1. Cholecystitis status post percutaneous drain.  2. Status post respiratory failure.  3. Ileus which is resolved.  4. Chronic renal failure.  5. Spinal stenosis status post decompression.  6. Staph aureus bacteremia.  7. Diabetes mellitus.  8. Coronary artery disease.   DISCHARGE MEDICATIONS:  1. Ancef 2 gm IV q.8 hours to be continued until December 04, 2006.  2. Flagyl 500 mg three times daily until December 04, 2006.  3. Lasix 40 mg twice daily.  4. Plavix 75 mg daily.  5. NPH insulin 50 units twice daily.  6. Vytorin 10/40 daily.  7. Terazosin 2 mg daily.  8. Enalapril 20 mg twice daily.  9. Coreg 25 mg twice daily.  10.Synthroid 0.125 mg daily.  11.Allopurinol 300 mg daily.  12.Norvasc 10 mg daily.   HOSPITAL COURSE:  For complete hospital course, see the last discharge  summary dictated by Dr. Lucia Gaskins on September 12, 2006.  Since then, Mr.  [name] had cholecystostomy and drain placed for his gallbladder disease.  He went into bradycardia and apnea for which he was intubated and was  sent to the ICU.  His blood cultures grew staph aureus which is  sensitive to oxacillin and he is on Unasyn.  At this time, the plan is  to treat him for two more weeks with Unasyn and Flagyl.  He also had E.  coli growing from his gallbladder which will be covered by Ancef.  The  Flagyl will cover the anaerobes.  He was put on TPN for protein calorie  malnutrition and at this time, he is off his TNA.  This  patient was  admitted under the neurosurgery service, but was transferred to [place] a  week after the patient was transferred out of the ICU.  At this time,  the patient is stable, in no acute distress, in a stable condition.   FOLLOWUP:  He has to follow up with Dr. Arnoldo Morale in one to two weeks and  he will be following up with Dr. Excell Seltzer in four weeks.  He will also  follow up with the primary care doctor in one to two weeks.      Vladimir Faster, MD  Electronically Signed     PKN/MEDQ  D:  11/20/2006  T:  11/20/2006  Job:  [id number]

## 2010-05-23 NOTE — Consult Note (Signed)
NAME:  KILLION, HOMRICH NO.:  [id number]   MEDICAL RECORD NO.:  [id number]          PATIENT TYPE:  INP   LOCATION:  3035                         FACILITY:  [facility]   PHYSICIAN:  Marlene Bast, MDDATE OF BIRTH:  08/24/1940   DATE OF CONSULTATION:  DATE OF DISCHARGE:                                 CONSULTATION   REASON FOR CONSULTATION:  Altered level of consciousness and diarrhea.   ASSESSMENT:  1. Delirium with extreme lethargy and hallucinations last night.  This      is postop day number 7 for this patient, who was doing quite well      first 3 days.  He was ambulating in the halls, but 4 days ago he      became significantly drowsy.  Now he falls asleep after each      sentence.  Even with family in the room, he cannot seem to stay      awake.  He has increased abdominal distention on exam.  He is not      eating much.  He has also been having loose stools.  The patient      tells me that last night he had an episode where he hallucinated,      but this was a vague episode where he was watching the TV, then      turned the TV off and continued to see unusual things, was not      more specific.  2. He has chronic renal insufficiency with a creatinine of      approximately 2.5.  3. He had hyperkalemia earlier in the hospital stay, but this      resolved.  4. History of hypothyroidism.  5. History of colon cancer.  6. Diabetes mellitus type 2.  7. History of a stroke.  8. Ileus.  9. Coronary artery disease.  10.Anemia.  11.Hypoparathyroidism.  12.Gout.  13.He has stage III chronic renal insufficiency or chronic kidney      disease.   MEDICATIONS:  At the time of my evaluation include:  1. Coreg 25 mg p.o. b.i.d.  2. Vasotec 20 mg p.o.  b.i.d.  3. Lasix 80 mg p.o. b.i.d.  4. Lactated Ringer's 50 mL an hour.  5. Synthroid 125 mcg p.o. daily.  6. Hytrin 2 mg p.o. daily.  And a few p.r.n. medications.   OBJECTIVE:  T-max is 98.7.  Pulse 69.   Respiratory rate 118.  Blood  pressure 110/57.  O2 saturations 97% on 4 liters nasal cannula.  Blood  sugars have been 169.  T 196 today.   SOCIAL HISTORY:  Noncontributory.   REVIEW OF SYSTEMS:  Deferred because of the patient's drowsiness.  He  and the family were able to tell me about his symptoms in the last  couple of days as mentioned above.   PHYSICAL EXAMINATION:  GENERAL:  The patient has his eyes closed.  He is  in no acute distress.  HEENT EXAM:  Normocephalic, atraumatic.  Pupils are pinpoint and round.  His sclerae nonicteric.  Oral mucosa moist.  NECK:  Supple.  No lymphadenopathy.  No thyromegaly.  No jugular venous  distention.  CARDIAC EXAM:  Regular rate and rhythm.  LUNGS:  Clear to auscultation laterally with no wheezes, rhonchi or  rales.  ABDOMEN:  Tight like a basketball.  He has high-pitched tinkling bowel  sounds.  EXTREMITIES:  Reveal no evidence of clubbing, cyanosis or pitting edema.  NEUROLOGICALLY:  He is drowsy, but will wake up and answer questions  appropriately.  When he wakes up, he is alert oriented.  He moves each  of his extremities to command.   DATA:  None at this time.   RECOMMENDATIONS:  1. Delirium.  The cause of the delirium is not overtly clear.  We will      initiate a workup that includes:      a.     Holding Percocet, valium, Ambien and any sedating       medications, though he has been getting these very rarely.      b.     Check a head CT.      c.     We will check labs to include a CPK, troponin, repeated his       kidney function, electrolytes, calcium and LFTs.      d.     We will check an EGD to rule out hypercarbia.  The patient       has no wheezing or lung history.      e.     The patient does not have any clear signs of infection.  He       had a UA done earlier today, which showed a little blood, but no       clear WBCs.  He is afebrile, and he had a normal white count       yesterday, so we will continue to monitor  for signs of sepsis, but       there are none present at this time.      f.     We will also work on resolving his ileus, perhaps that has       something to do with his diminished level of consciousness.  2. Ileus.  We will stop the Dulcolax and Colace for now and put in an      NG tube to suction.  The nurse tells me the ileus has been going on      for at least 3 days now and has not improved at all, and I suspect      that the diarrhea that he is having is likely secondary to this,      though C. diff cultures have been sent.  3. A history of hypothyroidism.  The patient is on Synthroid.  We will      check a TSH as well.  4. Diabetes mellitus.  We will continue current measures.  He is on      sliding scale at this time, but actually do not see any long acting      medication for diabetes.  5. For his known chronic kidney disease, we will just keep an eye on      this.  The patient did have an episode of hyperkalemia earlier in      the hospital stay.  6. Known coronary artery disease.  The patient did have a negative      stress test prior to his surgery and does have a history of remote      bypass  surgery.  We will check a CPK and troponin during his      workup.  He denies any chest pains.  So, the etiology of the      patient's delirium at this time remains a mystery, but we will      proceed with a workup, and I did discuss this with the family.      They are understandably quite concerned.  They do not feel      that the Percocet is the cause of delirium, nor do they think it      has anything to do with  his ileus, which these may very well be      true, but we will continue the workup as mentioned above.   Thanks very much for the consultation.      Marlene Bast, MD  Electronically Signed     CVC/MEDQ  D:  11/04/2006  T:  11/04/2006  Job:  [id number]   cc:   Vickki Hearing, M.D.

## 2010-05-23 NOTE — Consult Note (Signed)
NAME:  MARCELOUS, SEMAN NO.:  [id number]   MEDICAL RECORD NO.:  [id number]          PATIENT TYPE:  INP   LOCATION:  [id number]                         FACILITY:  [facility]   PHYSICIAN:  Leonel Ramsay, MD DATE OF BIRTH:  [birth year]   DATE OF CONSULTATION:  11/12/2006  DATE OF DISCHARGE:                                 CONSULTATION   REFERRING PHYSICIAN:  Ophelia Charter, M.D. in neurosurgery.   REASON FOR CONSULTATION:  Evaluation of Staphylococcus aureus bacteremia  and leukocytosis.   HISTORY OF PRESENT ILLNESS:  [name] is a pleasant 70 year old white  male with multiple medical problems including diabetes, prior CVA,  coronary artery disease, chronic renal insufficiency who underwent  surgery on October 28, 2006, with laminectomy and decompression at L4-L5  vertebrae for severe spinal stenosis.  He initially tolerated the  procedure well; however, he developed a postop ileus after several days  and also developed delirium.  He had an NG tube placed, which has  remained in place for the last 10 days.  He has been followed by GI as  well as Hospital Medicine.  He has had issues with increasing abdominal  pain as well as an increasing white count and has been on multiple  antibiotics.  Per his family, he started to develop increasing abdominal  pain last night and that this new abdominal pain was limited to his  right upper quadrant.  He has apparently not been having much in the way  of bowel movements or flatus, although he did have some diarrhea  initially following the surgery, which was found to be Clostridium  difficile negative.  He and his family denied any fevers, although his  white count has been markedly elevated.   PAST MEDICAL HISTORY:  1. Spinal stenosis status post laminectomy on October 28, 2006, as      above.  2. Diabetes.  3. Coronary artery disease.  4. CVA in 1998.  5. Chronic renal insufficiency.  6. Anemia.  7.  Hypoparathyroidism.  8. Gout.  9. History of colon cancer status post partial colectomy in 2002 with      negative colonoscopy in the last year or two.   SOCIAL HISTORY:  The patient lives with his wife.  He is a nonsmoker.   FAMILY HISTORY:  Noncontributory.   REVIEW OF SYSTEMS:  Systems were reviewed with the patient and are  negative, except as per HPI.   PHYSICAL EXAMINATION:  GENERAL:  The patient today is an overweight  white male who is somewhat tired appearing and doses off during  interview and exam.  VITAL SIGNS:  He has been afebrile over the last several days.  Blood  pressure and heart rate within normal limits.  HEENT:  He has an NG tube in place.  His oropharynx is dry with some dry  blood on the inside.  NECK:  Supple.  HEART:  Regular with a 1/6 systolic murmur at the left lower sternal  border.  LUNGS:  Coarse breath sounds throughout.  ABDOMEN:  Markedly distended and tympanic.  He is tender to  palpation,  but is notably in the right upper quadrant with tenderness to percussion  in that region.  SKIN:  He has a PICC line in his right upper extremity.  He has an  incision over his midline back, lumbar spine, which appears well  approximated.  There is no obvious drainage from this, but then this was  just changed.  There is minimal erythema and some induration, but again  minimal in amount.  He is again minimally tender over this region as  well.  EXTREMITIES:  He has 2+ lower extremity edema.  NEURO:  He is intact.  Moves all 4 extremities and answers questions,  but is again rather lethargic and drowsy.   DATA:  The patient has had, most recently, a white blood count of  32,000, which is elevated from 36,000 on November 11, 2006.  His white  blood count has been creeping up since November 09, 2006, it was 13 and  it jumped over the next day to 36.3.  His diff on that includes a  neutrophil of 92%.  His hemoglobin is 8.5, platelets of 304.  His most  recent  Chemo-7 includes BUN of 49, creatinine of 176 with an estimated  GFR of 39.  He had blood cultures drawn on November 08, 2006, with both  bottles growing methicillin-sensitive Staphylococcus aureus.  He also  had the wound culture of his drainage from his postop incision done  today.  The patient had Clostridium difficile toxins done on November 06, 2006, x2, which were negative as well as on November 04, 2006.  He had a  urinalysis on November 04, 2006, which was negative for infection.   IMAGING:  The patient had a CT of his abdomen and pelvis done today  which revealed small bilateral pleural effusion with adjacent  atelectasis or consolidation.  The gallbladder is distended with  adjacent inflammatory or edematous changes leading to the surrounding  mesenteric fat.  There was streaky inflammatory edematous changes in the  retroperitoneal fat around both kidneys.  There were postop changes  noted in the lower lumbar spine.  In the response to this CT finding  with the gallbladder, the patient had an ultrasound of his gallbladder  that showed distended sludge filled gallbladder with borderline  gallbladder wall thickening and a small amount of parahepatic ascites  and small bilateral pleural effusions.  The patient had a chest x-ray  done November 11, 2006, which showed findings compatible with  interstitial edema and right base atelectasis and left lower lobe  atelectasis is likely present.  The patient had an echocardiogram done  on November 06, 2006, which revealed overall normal systolic function  with an ejection fraction of 60%, mildly to moderately thickened  ventricular wall.  Aortic valve was mild to moderately increased in  thickness with trivial aortic regurg.  There was mild to moderate  thickening of the mitral valve and very mild mitral stenosis.  There was  also mild mitral regurg.   ASSESSMENT:  This is a pleasant 70 year old gentleman multiple medical  problems currently  approximately day 15 postoperative from L4-L5  laminectomy who has had a complicated postoperative course including  delirium, postoperative ileus, now Staphylococcus aureus bacterium,  marked leukocytosis, and possible cholecystitis.  There are multiple  sources for his Staphylococcus aureus bacteremia, most notably his  postoperative incision; however, his incision appears relatively intact  on exam.  There was some drainage noted earlier in the day and this is  being cultured.  Another source would be his PICC line, although  apparently the PICC line was placed November 08, 2006, the same day the  blood cultures were drawn, so this was unlikely to have been the source.  Other lines he may have had in the postoperative period could also have  been a source.  It is unlikely that his gallbladder disease is the  source of Staphylococcus aureus bacteremia and I believe this is likely  a second complicating condition.  There is also concern he could have  underlying endocarditis at this point, given that he does have abnormal  bowels noted on echocardiogram in the presence of Staphylococcus aureus  bacteremia.  It does not appear that he has a focal abdominal abscess  given that the CT was just done.  He does have some pleural effusion,  which could be another source of infection if his white count remains  elevated.  He is not having perfuse diarrhea, but Clostridium difficile  could also explain a markedly elevated white count.  He has had 3  negative since, although those were over a week ago.   RECOMMENDATIONS:  1. Staphylococcus aureus bacteremia.  This is sensitive Staphylococcus      aureus; however, in order to cover him broadly for his abdomen we      would continue him on Zosyn and vancomycin.  Actually he would be      switched to oxacillin since it is sensitive to this and it is an      optimal agent for treatment of Staphylococcus aureus bacteremia.  I      think we need to  settle his abdominal issue and possible      cholecystitis prior to doing this.  We would also consider in the      long-term adding rifampin for coverage for this.  He would need a      PICC line and at least a 4 week course of antibiotics for this      Staphylococcus aureus bacteremia.  2. I have asked the PICC line to be removed and peripheral IV placed      for several days until we can document clearance of his blood      cultures as the presence of the PICC line will make it difficult to      clear the bacteremia.  3. Repeated blood cultures.  One from the PICC line and one from the      periphery.  4. Ask for a transesophageal echocardiogram to further evaluate his      bowels and rule out endocarditis.  5. Probable cholecystitis.  General surgery has been consulted.  He      may require surgery versus percutaneous into his gallbladder.  We      will continue the antibiotics as above.  6. Postoperative incision.  This wound cultures sent today.  This      could potentially be a source for the Staphylococcus aureus      bacteremia and we will continue to monitor it.  He may need further      dedicated imaging of this to evaluate for deeper infection.  He may      also need exploratory surgery with a reopening of the wound to      explore for deeper infection.  7. Thank you for the consult.  We will be happy to follow the patient      with you.  Leonel Ramsay, MD  Electronically Signed     DPF/MEDQ  D:  11/12/2006  T:  11/13/2006  Job:  [id number]

## 2010-05-23 NOTE — Op Note (Signed)
NAME:  [name] [name], RENDA NO.:  [id number]   MEDICAL RECORD NO.:  [id number]          PATIENT TYPE:  AMB   LOCATION:  ENDO                         FACILITY:  [facility]   PHYSICIAN:  Waverly Ferrari, M.D.    DATE OF BIRTH:  07-24-1940   DATE OF PROCEDURE:  DATE OF DISCHARGE:                               OPERATIVE REPORT   PROCEDURE:  Colonoscopy.   INDICATIONS:  1. Colon cancer.  2. Diarrhea.   ANESTHESIA:  Fentanyl 100 mcg, Versed 9 mg.   DESCRIPTION OF PROCEDURE:  With the patient mildly sedated in the left  lateral decubitus position, a rectal examination was performed which was  unremarkable to my exam.  Subsequently, the Pentax videoscopic  colonoscope was inserted into the rectum and passed under direct vision  to the cecum, identified by ileocecal valve and appendiceal orifice,  both of which were photographed.  From this point, the colonoscope was  slowly withdrawn taking circumferential views of colonic mucosa,  stopping to take random biopsies along the way from normal-appearing  mucosa until we reached the rectum which appeared normal on direct and  on retroflexed view as well.  The endoscope was straightened and  withdrawn.  The patient's vital signs and pulse oximeter remained  stable.  The patient tolerated the procedure well without apparent  complications.   FINDINGS:  Unremarkable examination.  Anastomotic site well seen, well-  healed.   PLAN:  Await biopsy reports for evaluation of his diarrhea, which he  states is somewhat better on Questran powder.  The patient will call me  for results and follow-up with me as needed as an outpatient.           ______________________________  Waverly Ferrari, M.D.     GMO/MEDQ  D:  06/20/2007  T:  06/20/2007  Job:  [id number]   cc:   Jeneen Rinks L. Deterding, M.D.  Fax: [phone]   Nelda Severe. Juventino Slovak, M.D.  Fax: [phone]

## 2010-05-23 NOTE — Procedures (Signed)
CEPHALIC VEIN MAPPING   INDICATION:  Preop AVF vein mapping.   HISTORY:  Chronic kidney disease.   EXAM:  The right cephalic vein is compressible.   Diameter measurements range from 0.35 cm to 0.43 cm in the brachium and  of small caliber in forearm.   The left cephalic vein is compressible.   Diameter measurements range from 0.36 cm to 0.51 cm in the brachium and  of small caliber in the forearm.   See attached worksheet for all measurements.   IMPRESSION:  1. Patent bilateral cephalic veins which are of acceptable diameter      for use as a dialysis access site in the brachium.  2. Patent bilateral basilic veins with measurements as shown on      attached drawing.   ___________________________________________  Nelda Severe. Kellie Simmering, M.D.   AS/MEDQ  D:  05/11/2009  T:  05/11/2009  Job:  [id number]

## 2010-05-23 NOTE — Assessment & Plan Note (Signed)
[facility]   NAME:  [name] [name], [name] [name] NO.:  [id number]   MEDICAL RECORD NO.:  [id number]      DATE OF BIRTH:  1940-12-02   PHYSICIAN:  Octavia Heir, MD        VISIT DATE:                                   OFFICE VISIT   PROCEDURE:  Transesophageal echocardiogram.   ATTENDING:  Octavia Heir, MD   COMPLICATIONS:  None.   INDICATIONS:  The patient is a 70 year old male patient of Dr. Earle Gell, Dr. Terance Ice, Dr. [name], Dr. Delilah Shan with a  history of CAD, status post coronary bypass surgery in 1992 with  subsequently PCI of circumflex in 2002.  He also has a history of  diabetes, CVA, chronic renal insufficiency.  He was admitted initially  for L4-L5 spinal stenosis and underwent bilateral L4 laminectomies with  decompression by Dr. Earle Gell.  He did well for the first several  days.  However, 3 or 4 days postoperatively he developed increasing  confusion and lethargy and became febrile with rising white counts.  He  was ultimately found to have a cholecystitis and underwent a  percutaneous cholecystectomy and radiology on 11/13/2006.  However, he  had a respiratory arrest while undergoing this procedure.  He has been  seen by infectious disease who raised the issue of possible  endocarditis.  He is now for a transesophageal echocardiogram while  intubated to rule out the possibility of endocarditis.   DESCRIPTION OF THE PROCEDURE:  After obtaining informed consent from his  wife, the patient then received 75 mg of fentanyl and 2 mg of Versed.  He then underwent successful and uncomplicated transesophageal  echocardiogram.   The left ventricle appears to be normal in size with mildly depressed LV  systolic function.  Estimated EF approximately 40 to 44%.  There is mild  global hypokinesis.   The aortic valve was mildly thickened with no significant aortic  stenosis and trivial aortic insufficiency.  There is  mild thickening of  the anterior and posterior mitral valve leaflets with mild calcification  of the subvalvular apparatus.  There does not appear to be any  significant mitral stenosis with mild mitral regurgitation.   Structurally normal tricuspid valve with mild tricuspid regurgitation.   Structurally normal pulmonic valve with trivial pulmonic regurgitation.   There is a small PFO noted with left to right shunting by Doppler  interrogation.   There is no evidence of intracardiac thrombus/mass/vegetation present.   No gastric views are obtained secondary to NG tube.   CONCLUSION:  Successful transesophageal echocardiogram with findings  noted above.  There is no evidence of bacterial endocarditis present.      Octavia Heir, MD  Electronically Signed     RHM/MEDQ  D:  11/14/2006  T:  11/14/2006  Job:  [id number]   cc:   Ophelia Charter, M.D.  Richard A. Rollene Fare, M.D.  __________ [name], MD  __________ Delilah Shan, MD

## 2010-05-23 NOTE — Op Note (Signed)
NAME:  [name] [name], [name] [name] NO.:  [id number]   MEDICAL RECORD NO.:  [id number]          PATIENT TYPE:  INP   LOCATION:  5148                         FACILITY:  [facility]   PHYSICIAN:  Ophelia Charter, M.D.DATE OF BIRTH:  1940-11-06   DATE OF PROCEDURE:  10/28/2006  DATE OF DISCHARGE:                               OPERATIVE REPORT   BRIEF HISTORY:  The patient is a 70 year old white male who has suffered  from back and leg pain.  He has failed medical management.  Was worked  up with a lumbar MRI, which demonstrated that he had severe  multifactorial spinal stenosis at L4-L5.  I discussed the various  treatment options with the patient including surgery.  The patient has  weighed the risks, benefits, and alternatives of surgery and decided to  proceed with an L4-L5 decompression and fusion.   PREOPERATIVE DIAGNOSES:  L4-L5 spinal stenosis, disc degeneration, facet  arthropathy, lumbar radiculopathy, and lumbago.   POSTOPERATIVE DIAGNOSES:  L4-L5 spinal stenosis, disc degeneration,  facet arthropathy, lumbar radiculopathy, and lumbago.   PROCEDURE:  1. Bilateral L4 laminotomies  2. Decompression of bilateral L4 and L5 nerve roots; L4-L5      transforaminal lumbar interbody fusion; insertion of L4-L5      interbody prosthesis (Capstone PEEK interbody prosthesis); L4-L5      posterior nonsegmental instrumentation with legacy titanium pedicle      screws and rods; L4-L5 posterolateral arthrodesis with local      morselized autograft bone and VITOSS bone-graft extender.   SURGEON:  Ophelia Charter, M.D.   ASSISTANT:  Hosie Spangle, M.D.   ANESTHESIA:  General endotracheal.   ESTIMATED BLOOD LOSS:  250 mL.   SPECIMENS:  None.   DRAINS:  None.   COMPLICATIONS:  None.   DESCRIPTION OF PROCEDURE:  The patient was brought to the operating room  by the anesthesia team.  General endotracheal anesthesia was induced.  The patient was then turned to prone  position on the [place] frame.  The  lumbosacral region was then prepared with Betadine scrub and Betadine  solution.  Sterile drapes were applied.  I then injected the area to be  incised with Marcaine with epinephrine solution.  I used a scalpel to  make a linear midline incision over the L4-L5 interspace.  I used  electrocautery to perform a bilateral subperiosteal dissection exposing  the spinous process and lamina of L3, L4 and L5 and the upper sacrum.  I  inserted the Versa-Trac retractor for exposure.  We obtained  intraoperative radiograph to confirm our location.   We began the decompression by performing bilateral L4 laminotomies with  high-speed drill.  Because of the severe multifactorial spinal stenosis  at L4-L5, we needed to do a greater decompression than what would have  been required to simply do an interbody fusion.  I used the Kerrison  punch to widen the laminotomies and remove the medial aspect of the L4-  L5 facet and then performed foraminotomies about the bilateral L4 and L5  nerve roots completing the decompression.   We now turned our  attention to the arthrodesis.  I used the high-speed  drill and the Kerrison punch to remove the inferior facet of L4 on the  right.  I then performed a wide laminectomy on this side exposing the  lateral aspect of the L4-L5 disc.  I incised the L4-L5 intervertebral  disc with a #15 blade scalpel and performed a partial intervertebral  discectomy using the pituitary forceps and the curettes in preparation  for the transforaminal lumbar interbody fusion.  I cleared soft tissue  from the vertebral endplates at 075-GRM.  We then used trial spacers and  determined to use a 14 x 26 mm Capstone Peak interbody prosthesis.  We  prefilled this prosthesis with combination of VITOSS and local autograft  bone and then we filled the anterior aspect of the disc space with  VITOSS and local autograft bone.  We then inserted the prosthesis into   the L4-L5 interspace at the right, of course after retracting the neural  structures out of harms' way.  We then used the tamps to turn the  prosthesis sideways.  There was a good snug fit of the prosthesis in the  interspace.  We then filled the posterior disc space with local  autograft bone and VITOSS, completing the transforaminal lumbar  interbody fusion.   We now turned attention to the instrumentation.  Under fluoroscopic  guidance, we cannulated the bilateral L4 and L5 pedicles with bone  probe.  We tapped the pedicles with a 5.5 mm tap and then probed inside  the tapped pedicles to rule out cortical breeches and then inserted 6.5  x 50 mm pedicle screws bilaterally at L4 and L5 under fluoroscopic  guidance.  We then palpated along the medial aspect of the bilateral L4  and L5 pedicles and noted that there were no cortical breeches.  We then  connected the unilateral pedicles with a lordotic rod.  We compressed  the construct and then secured the rod in place with a capsule, which we  tightened appropriately completing the instrumentation.   We now turned our attention to the posterolateral arthrodesis.  We used  a high-speed drill to decorticate the remainder of the left L4-L5 facet,  pars, and the bilateral transverse processes at L4-L5 and then laid a  combination of local autograft bone and VITOSS bone-graft extender over  these decorticated posterolateral structures, completing the  posterolateral arthrodesis.   We then obtained hemostasis using bipolar cautery.  We irrigated the  wound out with bacitracin solution.  We inspected the thecal sac and the  bilateral L4 and L5 nerve roots and noted that they were well  decompressed.  We then removed the Versa-Trac retractor and then  reapproximated the patient's thoracolumbar fascia with interrupted #1  Vicryl suture, subcutaneous tissue with interrupted 2-0 Vicryl suture,  and the skin with Steri-Strips and Benzoin.  The  wound was then coated  with bacitracin ointment.  A sterile dressing was applied.  The drapes  were removed.  The patient was subsequently returned to the supine  position where he was extubated by the anesthesia team and transported  to the post-anesthesia care unit in a stable condition.  All sponge,  instrument, and needle counts were correct at the end of this case.      Ophelia Charter, M.D.  Electronically Signed     JDJ/MEDQ  D:  10/28/2006  T:  10/29/2006  Job:  [id number]

## 2010-05-23 NOTE — Assessment & Plan Note (Signed)
OFFICE VISIT   IBROHIM, BUCCIERI  DOB:  10-Jun-1940                                       07/19/2009  [id number]   The patient returns today following creation of left upper arm AV  fistula by me on May 11 for end-stage renal disease.  He is not on  hemodialysis at the present time and is right-handed.  The fistula is  functioning nicely with an excellent pulse and palpable thrill and a  nicely distended cephalic vein in the upper arm.  Incision is healed  well.  He does have mild steal symptoms in the left hand with occasional  numbness during the day.  He does not have chronic pain in the hand nor  total numbness and he states he thinks this is improving.   On exam he has an excellent grip in both hands.  The palpable pulse and  thrill are excellent.  With compression of the upper arm fistula the  radial pulse becomes 2+ and diminishes with the fistula open.   I have discussed with him at length the fact that if he does develop  total numbness in the hand or increasing discomfort in the hand he needs  to get back in touch with [place], consider ligation of the fistula.  He  feels that this is improving and that he can tolerate it and he will  call us if symptoms change.     Nelda Severe Kellie Simmering, M.D.  Electronically Signed   JDL/MEDQ  D:  07/19/2009  T:  07/20/2009  Job:  [id number]   cc:   Jeneen Rinks L. Deterding, M.D.

## 2010-05-23 NOTE — Discharge Summary (Signed)
NAME:  [name] [name], [name] [name] NO.:  [id number]   MEDICAL RECORD NO.:  [id number]          PATIENT TYPE:  INP   LOCATION:  3303                         FACILITY:  [facility]   PHYSICIAN:  Fenton Malling. Lucia Gaskins, M.D.  DATE OF BIRTH:  July 11, 1940   DATE OF ADMISSION:  10/28/2006  DATE OF DISCHARGE:                               DISCHARGE SUMMARY   REASON FOR CONSULTATION:  Possible gallbladder disease.   HISTORY OF PRESENT ILLNESS:  [name] [name] is a fairly complex 70 year old  white male, who is a primary patient of Dr. Ihor Gully, who was  admitted on October 28, 2006 for spinal stenosis by Dr. Newman Pies.  He underwent decompressive laminectomies in nerve roots of L4 and L5  with titanium pedicle screws and auto bone graft placed.   About 3-4 days postop apparently, he had an increasing abdominal  distention.  He has had a NG tube in for approximately the last 10 days.   He has been seen by Dr. Jim Desanctis from a GI standpoint.  He had a CT  scan today, in which there was distention in the gallbladder with  moderate surrounding inflammation.  This then precipitated an ultrasound  of his gallbladder.  His gallbladder ultrasound, however, was less  impressive with a distended sludge-filled gallbladder, borderline  thickening but no other stigmata of acute cholecystitis.   I reviewed the ultrasounds and CT scans with Dr. Markus Daft.  Actually,  which is interesting, is on a CT scan of his abdomen, there is actually  stranding in both retroperitoneums, more so on the right than left.  This seems to be a process well outside just the limits of the  gallbladder, whose etiology is unclear.  Dr. Anselm Pancoast raised the question of  whether something wrong with the appendix, but of note, [name] [name] has  had an appendectomy as a teenager.   From a GI standpoint, [name] [name] has had no history of peptic ulcer  disease or liver disease or known gallbladder disease, though he has had  some prior  history of fatty food intolerance according to his wife.  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 Neldon Mc on September 19, 2000.  He has had followup exams by Dr. Jim Desanctis.  Most recently  he had a colonoscopy on June 14, 2004, but it was an unremarkable exam at  that time.   The patient has actually had a NG tube, but he did have a normal bowel  movement today.  He has been able to get up and walk around in the 3300  unit.   CURRENT MEDICATIONS:  He is on:  1. Vancomycin and Zosyn and erythromycin as antibiotics he has taken.  2. Coreg.  3. Hytrin.  4. Norvasc.  5. Protonix.  6. Lasix.  7. NovoLog.  8. Zyloprim.  9. Synthroid.   REVIEW OF SYSTEMS:  NEUROLOGIC:  He had a stroke in 1998.  He was  hospitalized at [facility], but has pretty much recovered  from  that.  PULMONARY:  He has had no significant pulmonary disease, pneumonia.  He  does not smoke cigarettes.  CARDIAC:  He has had a cardiac bypass twice, first in 1992 by Dr.  Merleen Nicely, most recently in 2006 by Dr. Tharon Aquas Trigt.  He sees  Dr. Terance Ice from a cardiology standpoint and has been followed  by him.  GASTROINTESTINAL:  See history of present illness.  UROLOGIC:  He has had no history of kidney stones or kidney infections.  He has had borderline renal failure.  He sees Dr. Jeneen Rinks Deterding as an  outpatient.  He said this all started even before his first bypass.  I  think he has been seen by Dr. Edrick Oh, at least in the hospital from  a renal standpoint, where his creatinine looks like it has bounced  around 2 to 2.5 range.  ENDOCRINE:  He is a type 2 diabetic.  He is on thyroid medicine  replacement for hypothyroidism.   He has a history of gout.   PHYSICAL EXAMINATION:  VITAL SIGNS:  His temperature is 97.5, heart rate  65, blood pressure 115/41.  GENERAL:  He is a well-nourished, balding  white male, alert,  cooperative, fell asleep during the exam a couple of times.  He has a NG  tube in place.  NECK:  Supple without mass or thyromegaly.  LUNGS:  Clear to auscultation with symmetric breath sounds.  HEART:  Has a regular rate and rhythm.  I hear no murmur or rub.  ABDOMEN:  Distended.  He has a well-healed midline scar.  He has a well-  healed right lower quadrant scar.  He has some soreness and tenderness  in the right upper quadrant, but no real bad guarding or rebound.  He  actually has active bowel sounds.  I did not do a rectal exam on him.  I  feel no hernia, no mass.  Again, he is fairly distended male, who  probably has a pretty large abdomen anyway.  EXTREMITIES:  He has good strength in the upper and lower extremities.  NEUROLOGICAL:  Grossly intact.   LABORATORY DATA:  Labs that I have show a sodium of 140, potassium 3.9,  chloride 110, glucose 186, BUN 49, creatinine 1.7.  His total protein is  5.2, albumin 2.2, his pre-albumin 8.2, total cholesterol 52.   His hemoglobin is 8.5, hematocrit 25, white blood count of 32,000.   His CT scan and ultraosund I have already commented on and reviewed with  that of [place].   IMPRESSION:  1. Probable gallbladder disease.  However, I think for completeness I      think he would be best served by having a hepatobiliary scan.  If      the hepatobiliary scan is positive, in other words showing cystic      duct obstruction, then the question in this gentleman is he better      served with surgery versus percutaneous drain.  This finding would      need to be a carried out discussion probably with his neurosurgeon,      Dr. Arnoldo Morale, and may his cardiologist.  The family is concerned about him being put to sleep again.  I  tried to  address some of their fears about this.  1. Elevated white blood count 32,000, which seems a little out of      proportion to what I am finding by physical exam and on his  ultrasound of his  gallbladder and wonder whether he has another      source of infection.  2. Positive blood culture with Staphylococcus aureus.  This would be      an unusual bacteria to be of gallbladder origin and these were done      on 2 blood cultures on the 31st.  These are methicillin-sensitive      Staph aureus.  3. Status post laminectomy, which apparently he has doing well.  He      has gotten up and walked and says his back pain is less.  4. Type 2 diabetes.  5. He is on TPN with a preop being made 0.2.  6. Chronic renal insufficiency with a creatinine of 1.76, a BUN of 49,      followed by Dr. [name] in renal.  8 . Coronary artery disease, which appears to be doing pretty well since  he has been in the hospital.  He has been followed by Dr. Terance Ice for this.  1. History of stroke, which has resolved with no further neurologic      problems.  2. History of colon cancer with no evidence of disease at this time.      Fenton Malling. Lucia Gaskins, M.D.  Electronically Signed     DHN/MEDQ  D:  11/12/2006  T:  11/12/2006  Job:  [id number]   cc:   Ophelia Charter, M.D.  Leonel Ramsay, MD  Waverly Ferrari, M.D.  James L. Deterding, M.D.  Nelda Severe Juventino Slovak, M.D.  Richard A. Rollene Fare, M.D.

## 2010-05-23 NOTE — Consult Note (Signed)
NEW PATIENT CONSULTATION   [name] [name], BONOMO  DOB:  May 07, 1940                                       05/10/2009  [id number]   The patient is a 70 year old male referred by Dr. [name] for vascular  access.  He is right-handed.  Has never been on dialysis in the past.  He has been followed for many years for chronic renal insufficiency  thought to be secondary to diabetes mellitus and hypertension.   CHRONIC MEDICAL PROBLEMS:  1. Coronary artery disease, previous coronary artery bypass grafting      in '92 and '06.  2. Type 1 diabetes mellitus.  3. Hypertension.  4. Hyperlipidemia.  5. History of colon cancer.  6. Hyperparathyroidism.  7. History of a left brain stroke in 1998.  8. Negative for COPD.   FAMILY HISTORY:  Positive for coronary artery disease and diabetes in  his mother and father.  Negative for stroke.   SOCIAL HISTORY:  He is married, has three children and is retired.  Does  not use tobacco or alcohol.   REVIEW OF SYSTEMS:  Positive for anorexia, heart murmur, diarrhea, right  hip pain.  In all other systems in the complete review of systems were  negative.   PHYSICAL EXAM:  Vital signs:  Blood pressure 138/66, heart rate 55,  respirations 14.  General:  He is a well-developed, well-nourished male  who is in no apparent distress.  He is alert and oriented x3.  HEENT:  Exam is normal.  EOMs intact.  Lungs:  Clear to auscultation.  No  rhonchi or wheezing.  Cardiovascular:  Regular rhythm.  No murmurs.  Carotid pulses are 3+.  No audible bruits.  Lower extremities:  Exam  reveals 3+ femoral pulses with well-perfused lower extremities.  Abdomen:  Soft, nontender with no masses.  Musculoskeletal:  Exam is  free of major deformities.  Neurological:  Normal.  Skin:  Free of  rashes.  Left upper extremity:  Exam reveals a 3+ brachial and a 2+  radial pulse.  He has a small cephalic vein in the forearm but a good  upper arm cephalic vein  on physical exam.   Today I ordered vein mapping of both upper extremities which I reviewed  and interpreted.  It appears that the cephalic veins are adequate in the  upper arms bilaterally for brachiocephalic fistulas as are his basilic  veins.  I also reviewed the records provided by Dr. [name].   Plan is to create a left upper arm brachial artery to cephalic vein AV  fistula (Kauffman shunt) and I have scheduled that for Wednesday May 11  at [facility] as an outpatient.  Potential risks of a steal syndrome  as well as thrombosis of the fistula have been discussed with the  patient and he would like to proceed.     Nelda Severe Kellie Simmering, M.D.  Electronically Signed   JDL/MEDQ  D:  05/10/2009  T:  05/11/2009  Job:  [id number]   cc:   Jeneen Rinks L. Deterding, M.D.

## 2010-05-23 NOTE — Op Note (Signed)
NAME:  [name] [name], [name] [name] NO.:  [id number]   MEDICAL RECORD NO.:  [id number]          PATIENT TYPE:  OIB   LOCATION:  [id number]                         FACILITY:  [facility]   PHYSICIAN:  Marland Kitchen T. Hoxworth, M.D.DATE OF BIRTH:  01-05-1941   DATE OF PROCEDURE:  01/17/2007  DATE OF DISCHARGE:                               OPERATIVE REPORT   PREOPERATIVE DIAGNOSIS:  Severe cholecystitis.   POSTOPERATIVE DIAGNOSIS:  Severe cholecystitis.   SURGICAL PROCEDURES:  1. Laparoscopic cholecystectomy.  2. Intraoperative cholangiogram.   SURGEON:  Marland Kitchen T. Hoxworth, M.D.   ASSISTANT:  Earnstine Regal, M.D.   ANESTHESIA:  General.   BRIEF HISTORY:  [name] is a 70 year old male who approximately  8 weeks ago developed severe acute cholecystitis in the early  postoperative period following surgery for spinal stenosis.  He  underwent percutaneous cholecystostomy at that time, as he was having  respiratory failure and was actually on the ventilator for short time.  He, however, improved, was discharged home and his drain has remained in  place.  Gallbladder ultrasound showing sludge and thickened gallbladder  wall.  He continues to have some mild right upper quadrant discomfort.  He is now stabilized to his usual state of health and we have  recommended proceeding with laparoscopic cholecystectomy with  cholangiogram.  The nature of the procedure, indications, risks of  bleeding, infection, bile leak, bowel injury, possible need for open  procedure were discussed and understood.  He is now brought to the  operating room for this procedure.   DESCRIPTION OF OPERATION:  The patient was brought tolerated the  operating room and placed in supine position on the operating table and  general endotracheal anesthesia was induced.  The percutaneous tube was  cut and then removed intact.  The abdomen was widely sterilely prepped  and draped.  Correct patient and procedure were  verified.  He received  preoperative antibiotics.  Local anesthesia was used to infiltrate the  trocar sites prior to the incisions.  A 1-cm incision was made below the  umbilicus and dissection carried down to the midline fascia.  This was  opened under direct vision for about a centimeter and careful dissection  was used to enter the peritoneal cavity, as the patient did have a  previous midline incision from colectomy.  However, preperitoneal cavity  was entered without difficulty and through a mattress suture of Vicryl,  the Hasson trocar was placed and pneumoperitoneum established.  There  were no significant adhesions in the right side of the abdomen.  Under  direct vision, a 10-mm trocar was placed in the subxiphoid area and two  5-mm trocars along the right subcostal margin.  Omental adhesions were  taken down off the gallbladder under direct vision.  The gallbladder was  contracted and had very significant chronic inflammatory change.  The  fundus was able to be exposed and elevated and further fibrofatty tissue  carefully dissected down off the gallbladder, working toward the porta  hepatis.  The fundus was exposed and grasped and retracted  inferolaterally.  The anatomy was a  little difficult due to the  contracted gallbladder and chronic inflammation.  We identified what  appeared to be tapering of the distal gallbladder; at this point,  peritoneum was incised around the gallbladder anteriorly and posteriorly  and fibrofatty tissue was stripped down off the gallbladder toward the  porta hepatis.  The dissection continued very carefully and slowly and  we were able to define the gallbladder tapering down to what appeared to  be the cystic duct.  This was encircled, dissected over about a  centimeter and clipped.  This was opened and was the cystic duct and an  operative cholangiogram was then obtained through this; this showed a  fairly long remaining cystic duct and normal  common bile and  intrahepatic ducts with free flow into the duodenum and no filling  defects.  Following this, the cholangiocath was removed and the cystic  duct was doubly clipped proximally and divided.  The cystic artery was  clearly seen at this point in Calot's triangle, coursing up on the  gallbladder wall and it was divided between 2 proximal and distal clips.  The gallbladder was then dissected free from its bed using hook cautery.  There was severe chronic inflammation and dissection was rather tedious,  but progressed satisfactorily in the gallbladder was placed in the  EndoCatch bag and removed through the umbilicus.  Complete hemostasis  was obtained in the gallbladder bed with cautery and a Surgicel pack.  The right upper quadrant was thoroughly irrigated.  Trocars were removed  under direct vision and all CO2 evacuated.  The mattress suture was  secured at the umbilicus.  Skin incisions were closed with interrupted  subcuticular 4-0 Monocryl and Dermabond.  The patient taken to Recovery  in good condition.      Darene Lamer. Hoxworth, M.D.  Electronically Signed     BTH/MEDQ  D:  01/17/2007  T:  01/18/2007  Job:  [id number]

## 2010-05-26 NOTE — Op Note (Signed)
   [name] [name], CONSENTINO NO.:  [id number]   MEDICAL RECORD NO.:  [id number]                   PATIENT TYPE:  AMB   LOCATION:  ENDO                                 FACILITY:  [facility]   PHYSICIAN:  Waverly Ferrari, M.D.                 DATE OF BIRTH:  1940-03-14   DATE OF PROCEDURE:  DATE OF DISCHARGE:                                 OPERATIVE REPORT   PROCEDURE:  Colonoscopy.   ANESTHESIA:  Demerol 70, Versed 6 mg.   PREP:  Bisacodyl tablets and the prep was good.   INDICATIONS FOR PROCEDURE:  [name] [name] is a 70 year old gentleman with colon  cancer resected a year ago who presents for evaluation of diarrhea which  actually antedated the onset of his cancer. The patient complains of 2-3  loose bowel movements a day somewhat better on the Imodium use. On a number  of medications that could cause diarrhea and we have referred him to PharmD.  He has not as yet availed himself of that.   DESCRIPTION OF PROCEDURE:  With the patient mildly sedated in the left  lateral decubitus position, a rectal exam was performed which was  unremarkable. Subsequently, the Olympus videoscopic colonoscope was inserted  in the rectum and passed under direct vision to the cecum identified by the  ileocecal valve and appendiceal orifice. From this point, the colonoscope  was slowly withdrawn taking circumferential views of the entire colonic  mucosa stopping only then in the rectum which appeared normal in direct and  retroflexed view. The endoscope was straightened and withdrawn. The  patient's vital signs and pulse oximeter remained stable. The patient  tolerated the procedure well without apparent complications.   FINDINGS:  This is an unremarkable colonoscopic examination one year post  resection of left sided colon cancer.   PLAN:  Have the patient follow-up with me for further evaluation.                                                Waverly Ferrari, M.D.    GMO/MEDQ  D:  08/14/2001  T:  08/17/2001  Job:  [id number]   cc:   Jeneen Rinks L. Deterding, M.D.   Stanley C. Redmond Pulling, M.D.   [name], M.D.  Fax: [phone]   Richard A. Rollene Fare, M.D.

## 2010-05-26 NOTE — Op Note (Signed)
NAME:  [name] [name], [name] [name] NO.:  [id number]   MEDICAL RECORD NO.:  [id number]                   PATIENT TYPE:  AMB   LOCATION:  ENDO                                 FACILITY:  [facility]   PHYSICIAN:  Waverly Ferrari, M.D.                 DATE OF BIRTH:  10/23/1940   DATE OF PROCEDURE:  DATE OF DISCHARGE:                                 OPERATIVE REPORT   PROCEDURE:  Upper endoscopy with biopsy.   INDICATIONS:  Weight loss, hemoccult positivity.   ANESTHESIA:  Demerol 90 mg, Versed 8 mg.   DESCRIPTION OF PROCEDURE:  With the patient mildly sedated in the left  lateral decubitus position, the Olympus videoscopic endoscope was inserted  in the mouth and passed under direct vision through the esophagus, which  appeared normal until it reached the distal esophagus and there was a  questionable area of Barrett's with an island reddened mucosa separated by a  very thin line of normal esophageal mucosa from above the Z-line.  This was  photographed.  We entered into the stomach and a punctate ulcer with a round  smooth border and surrounding erythema was noted on one of the folds in the  proximal stomach.  This was approximately 5 mm in size and had a black  eschar base and there were some blood flecks in the stomach indicating this  may, in fact, be where the hemoccult positivity was emanating from.  This  was photographed and biopsied around its perimeter.  The endoscope was then  advanced to the remainder of the stomach.  The fundus, body, duodenal bulb,  and second portion of the duodenum appeared relatively normal.  From this  point, the endoscope was slowly withdrawn taking circumferential views of  the duodenal mucosa until the endoscope then pulled back into the stomach,  placed in retroflexion and viewed the stomach from below.  The endoscope was  then straightened, withdrawn, taking circumferential views of the remaining  gastric and esophageal mucosa,  stopping in the distal esophagus to biopsy  the areas of  reported  Barrett's.  The patient's vital signs and pulse  oximetry remained stable.  The patient tolerated the procedure well without  apparent complication.   FINDINGS:  Gastric ulcer on the folds, small punched out lesion with black  eschar base as well as some heme seen in the area indicating this could very  well be the cause of the patient's hemoccult-positivity.  This was biopsied  along with what appears to be possibly an island of Barrett's esophagus,  biopsied.   PLAN:  Await biopsy reports.  The patient will call me for results and  follow up with me as an outpatient.  Will proceed to colonoscopy as planned.  Waverly Ferrari, M.D.   GMO/MEDQ  D:  Nov 01, 202004  T:  Nov 01, 202004  Job:  [id number]   cc:   Jeneen Rinks L. Deterding, M.D.  [place]  [place] [address]  Fax: [phone]

## 2010-05-26 NOTE — Procedures (Signed)
[place]  Patient:    [name] [name], [name] [name]                       MRN: [id number] Adm. Date:  [id number] Attending:  Jim Desanctis CC:         Joyice Faster. Deterding, M.D.  Stanley C. Redmond Pulling, M.D.  Richard A. Rollene Fare, M.D.   Procedure Report  PROCEDURE:  Colonoscopy.  INDICATIONS:  Iron deficiency anemia, Hemoccult-positive stools.  ANESTHESIA:  Versed an extra 2 mg.  DESCRIPTION OF PROCEDURE:  With patient mildly sedated in the left lateral decubitus position, the Olympus videoscopic colonoscope was inserted in the rectum and passed under direct vision to the cecum, identified by the ileocecal valve and appendiceal orifice, both of which were photographed. From this point the colonoscope was slowly withdrawn, taking circumferential views of the entire colonic mucosa, stopping in the descending colon just distal to the splenic flexure, where a very large, probably 2.5 cm, polyp was seen, photographed.  It was oozing somewhat and may, in fact, be the cause also partially of patients blood loss.  See endoscopy note as well.  This polyp was photographed and placed in position for snaring, which we did using ERBE argon photocoagulator generator to snare this polyp through.  Once this was removed, the remainder of the polyp had to be removed using the ERBE argon photocoagulator, which we did, and we finally eradicated the entire polyp/ tumor with good photocoagulation and good hemostasis noted.  After this was accomplished, the endoscope was then further withdrawn, taking circumferential views of the remaining colonic mucosa, stopping at the rectosigmoid area at 10-20 cm from the anal verge, at which point two other polyps were seen, photographed, and removed using both snare cautery technique and hot biopsy forceps technique.  Once these were removed, the endoscope was pulled back to the rectum, which appeared normal on direct and retroflexed view.   The endoscope was straightened and withdrawn.  The patients vital signs and pulse oximetry remained stable.  Patient tolerated the procedure well without apparent complications.  FINDINGS:  Very large polyp of descending colon, removed and eradicated using the argon photocoagulator in both the snare and with the coagulating tip. Second and third polyp seen in the rectosigmoid areas at 10-20 cm from the anal verge, also removed and placed in one bottle.  PLAN:  Await biopsy report.  Will hold Coumadin for three more days given the size and extent of the polyp removed from the descending colon, and will have patient call me for results of biopsy and follow up with me as an outpatient. DD:  08/06/00 TD:  08/06/00 Job: [id number]

## 2010-05-26 NOTE — Discharge Summary (Signed)
NAME:  ELENA, RUBERG NO.:  [id number]   MEDICAL RECORD NO.:  [id number]          PATIENT TYPE:  INP   LOCATION:  2020                         FACILITY:  [facility]   PHYSICIAN:  Ivin Poot, M.D.  DATE OF BIRTH:  1940/04/28   DATE OF ADMISSION:  03/18/2004  DATE OF DISCHARGE:  03/06/2004                                 DISCHARGE SUMMARY   ADMISSION DIAGNOSES:  1.  Unstable angina with history of coronary artery disease, status post      coronary artery bypass graft.  2.  Chronic renal insufficiency secondary to diabetes mellitus and      hypertension.   DISCHARGE DIAGNOSES:  1.  Class IV unstable angina with progressive/recurrent coronary artery      disease, status post coronary artery bypass graft in 1992 by Dr. Luna Kitchens. Redmond Pulling, now status post redo coronary artery bypass graft.  2.  Chronic kidney disease, stage 4. Baseline creatinine 2.9 to 3.4 with      estimated creatinine clearance of 18 mL per minute on admission.      Postoperative creatinine stable at 2.0.  3.  Hypertension.  4.  History of colon cancer, status post bowel resection in 2002.  5.  Secondary hyperparathyroidism.  6.  Anemia of chronic disease as well as postoperative anemia, status post      blood transfusion.  7.  Gout.  8.  History of cerebrovascular accident in 1998.  9.  Diabetes mellitus, insulin dependent.  10. Hypercholesterolemia.  11. Hypothyroidism.  12. Peptic ulcer disease.  13. Obesity.  14. Hypercalcemia.  15. Prostatism.  16. History of appendectomy.  17. History of hernia repair.   ALLERGIES:  No known drug allergies.   PROCEDURE:  1.  March 27, 2004:  Redo coronary artery bypass grafting x 2 using a      saphenous vein graft to the obtuse marginal-1, saphenous vein graft to      obtuse marginal-2. Surgeon was Dr. Tharon Aquas Trigt.  2.  March 22, 2004:  Cardiac catheterization by Dr. Delrae Alfred B. Little showing      patent mammary artery graft to the LAD  diagonal as a sequential graft.      His vein graft to the circumflex system was occluded. The right coronary      had nonsignificant stenosis with ejection fraction estimated at 50%.  3.  March 23, 2004:  Carotid Doppler showed mild heterogenous plaque noted      at the bifurcation origin of the right internal carotid artery and      external carotid artery with acoustic shadowing, 40-60% right internal      carotid artery stenosis on the left. There was mild heterogenous plaque      noted at the bifurcation origin of internal carotid and external carotid      arteries with acoustic shadowing with no evidence of significant      internal carotid artery stenosis. There was vertebral artery flow with      antegrade bilaterally.  4.  March 23, 2004:  Bilateral upper  and lower extremity Doppler studies      showed greater than 1.0 ABI on the left and not obtainable on the right      due to calcified vessels.  5.  March 20, 2004:  A 2-D echocardiogram showing mildly decreased left      ventricular ejection fraction estimated at 40-50%. Findings were      suggestive of mild hypokinesis in the mid anterior wall. Left      ventricular wall thickness was mildly decreased. The aortic valve was      mildly calcified and there trivial aortic valvular regurgitation.   HISTORY OF PRESENT ILLNESS:  Mr. Stampley is a 70 year old male with history  of coronary artery disease, diabetes, chronic renal insufficiency, and colon  cancer to name a few. He has had multiple PCIs since undergoing CABG in 1992  by Dr. Dorothyann Peng C. Wilson. He began to experience recurrent chest pain in  October of 2005. A stress Cardiolite test was done in December which  reportedly was low risk, although the report is unavailable. He has  continued to have chest pain which is responsive to nitroglycerin. Over the  past three weeks prior to admission, he was having increasing chest pain,  now two to three times per day.   On March 18, 2004, he developed more severe chest pain involving his left  chest that radiated to his left arm. There was minimal relief with three  sublingual nitroglycerin. He denied shortness of breath, nausea, vomiting or  diaphoresis. He then presented to [facility] Emergency  Department and then obtained relief with IV nitroglycerin. Cardiac enzymes  were positive with a peak myoglobin of greater than 500, peak MB is 22.8 and  peak troponin is 0.92. EKG showed normal sinus rhythm with left bundle  branch block.   Based on his symptoms and positive enzymes, it was felt that he should be  admitted to CCU on IV heparin.   HOSPITAL COURSE:  Mr. Caldeira was admitted to [facility]  CCU on March 18, 2004 for unstable angina and elevated cardiac enzymes. Of  note, he also was noted to have elevated creatinine of 3.6. A renal consult  was requested and he was seen by Dr. Donato Heinz. He felt that since  his creatinine was actually at baseline and he was without signs of uremia  that he would not need any acute intervention; however, if his renal  function deteriorated further, it was anticipated that he may require  hemodialysis in the near future. In regards to his cardiac status, he was  maintained on IV heparin. He ultimately underwent a 2-D echocardiogram as  well as cardiac catheterization with results as discussed above. Based on  the findings, Dr. Tharon Aquas Trigt with CVTS was consulted regarding possible  coronary revascularization.   After examining the patient and review of his films, he did feel that while  he was a high risk patient that his best treatment option was redo coronary  artery bypass grafting. As his renal function remained stable, Dr. Tharon Aquas  Trigt felt that he could proceed with surgery by March 27, 2004. In the mean  time, he was maintained on IV nitroglycerin, IV heparin, and IV Integrilin. He also underwent precoronary surgery  Doppler studies with results as  discussed above.   Following his heart catheterization, Mr. Lukowski creatinine did remain  stable and actually improved from his admission and was now 2.5. On March 27, 2004, he did undergo redo coronary artery bypass grafting. Overall, he  was felt tolerate the procedure well and was transferred to the surgical  intensive care unit in hemodynamically stable condition. He did require  blood transfusion for postoperative anemia with a hemoglobin of 6.8.   On postoperative day one, he had been extubated and neurologically intact.  His renal function remained stable with a creatinine of 2.2. Urine output  was adequate. He was maintained on renal dopamine short-term  postoperatively. Otherwise, he remained stable. Of note, his diabetes was  initially managed through IV insulin via Glucomander. Once he began  tolerating oral diet, he was converted to Lantus insulin subcutaneously.   By postoperative day two, Mr. Poteete had been weaned from dopamine and was  tolerating beta blocker therapy. He was maintained on diuretic therapy. His  chest tube output remained low and was discontinued.   By postoperative day three, he had been transferred out to the floor. Of  note, he had been on chronic Coumadin prior to his hospitalization due to  his history of stroke; however, postoperatively, Dr. Tharon Aquas Trigt felt it  would be more appropriate that this Coumadin be discontinued and instead  Plavix and aspirin therapy initiated. Once out on the floor, Mr. Dollens made  relatively good progress. His renal function remained stable and was in his  baseline average. He was able to increase ambulation daily and was noted to  have a steady gait. He was tolerating carbohydrate modified diet. Most  recent blood sugars ranged between 120 to 170 on subcutaneous Lantus  insulin. When the patient felt that once discharge he would resume his home  regimen of Humulin insulin,  which he was quite familiar with and used  somewhat as a sliding scale based on his daily intake and blood sugar  reading.   He did have mild shortness of breath with orthopnea at night initially. He  was continued on diuretic therapy and was eventually weaned from  supplemental oxygen and was saturating at 95%. His blood pressure remained  stable with systolic readings ranging in the 120s to [id number] with diastolic  reading in the [id number] to 60s. He was afebrile. He was in sinus rhythm in the  60s to 70s. His wounds were healing well without signs of infection. His  pain was also managed on oral medication. His bowel and bladder functioned  appropriately and he maintained good urine output.   His postoperative chest x-rays were stable showing improving left lower lobe  atelectasis with persistent small bilateral pleural effusions. There was no  evidence of congestive heart failure. Cardiomegaly was noted and felt stable. Despite Mr. Grill multiple comorbidities, it was felt that he  progressed relatively well following his redo coronary artery bypass  grafting surgery. In fact, it was felt he would be ready for discharge home  by postoperative day 27 on April 03, 2004 as his creatinine remained stable  and no significant changes were noted in his status.   LABORATORY DATA:  Labs on April 02, 2004 show a white blood count of 10.8,  hemoglobin 8.9, hematocrit 25.0, platelet count 269,000. Sodium 134,  potassium 4.0, blood glucose 133, BUN 32, creatinine 2.0, calcium 9.0,  chloride 99, CO2 29. Other recent labs show a magnesium of 2.5, fecal occult  was negative x 3. CEA was normal at 1.6. Liver function tests showed a total  bilirubin of 1.2, direct bilirubin of 0.2, indirect bilirubin of 1.0,  alkaline phosphate slightly decreased at  31, SGOT 25, SGPT at 21, total  protein 6.7, and albumin 3.8. Phosphorus of 4.0. hemoglobin A1c is 6.4.  Total cholesterol of 85, triglycerides 180, HDL of 18,  LDL of 31. TSH of  0.715.   DISCHARGE MEDICATIONS:  1.  Plavix 75 mg 1 p.o. q.d.  2.  Aspirin 81 mg p.o. q.d.  3.  Coreg 12.5 mg p.o. b.i.d.  4.  Altace 2.5 mg p.o. q.d.  5.  Vytorin 10/40 mg p.o. q.d.  6.  Synthroid 125 mcg p.o. q.d.  7.  Resume Humulin insulin per home regimen.  8.  Clonidine patch 0.2 mg transdermally to be changed every 7 days.  9.  Allopurinol 150 mg q.d.  10. Lasix 40 mg p.o. q.d.  11. K-Dur 20 mEq p.o. q.d.  12. Niferex 150 mg p.o. q.d.  13. Hytrin 1 mg p.o. q.h.s.  14. Tylox 1-2 tablets p.o. q.4-6h. p.r.n. pain.  15. He was instructed to stop his Coumadin.   DISCHARGE INSTRUCTIONS:  He was instructed to avoid driving or heavy lifting  more than 10 pounds. He was encouraged to continue daily walking and  breathing exercises. He was to follow a low fat, low salt, carbohydrate  modified diet. He may shower and clean his incisions gently with mild soap  and water. He should notify the CVTS office if he develops fever greater  than 101 or any redness or drainage from his incision site or increasing  shortness of breath, or weight gain.   FOLLOW UP:  He is to follow up with the [facility]TS office for staple removal  appointment on April 07, 2004 at 11 a.m. He is to follow up with Dr. Tharon Aquas Trigt at the [facility] office on April 28, 2004 at 1:15 p.m. He is to have a  chest x-ray one hour before this appointment at the [facility] and was instructed to bring this chest x-ray with him to the CVTS  office. He is to call [phone] to schedule a two-week follow-up with Dr.  Nanine Means. Rollene Fare. He should also schedule a follow-up blood work to  monitor his kidney function at this appointment.      AWZ/MEDQ  D:  04/02/2004  T:  04/03/2004  Job:  [id number]   cc:   Patient's chart   Len Childs, M.D.  [address]  [place]  [place] [address]   Donato Heinz, M.D.  [address]  [place]  [place] [address]  Fax: [phone]  Jeanella Craze. Little,  M.D.  [address] [place] [address]  Fax: [place]. Rollene Fare, M.D.  [phone] [address], [place] [address]  Fax: [place]. Redmond Pulling, M.D.  [address]  [place]  [place] [address]  Fax: [phone]

## 2010-05-26 NOTE — Op Note (Signed)
NAME:  [name] [name], [name] [name] NO.:  [id number]   MEDICAL RECORD NO.:  [id number]          PATIENT TYPE:  AMB   LOCATION:  ENDO                         FACILITY:  [facility]   PHYSICIAN:  Waverly Ferrari, M.D.    DATE OF BIRTH:  [birth year]   DATE OF PROCEDURE:  DATE OF DISCHARGE:                                 OPERATIVE REPORT   PROCEDURE:  Upper endoscopy.   INDICATIONS:  GERD.   ANESTHESIA:  Demerol 60, Versed 7 mg.   PROCEDURE:  With the patient mildly sedated in the left lateral decubitus  position, the Olympus videoscopic endoscope was inserted in the mouth and  passed under direct vision through the esophagus, which appeared inflamed. I  could not see clear-cut evidence of Barrett's esophagus because of some of  the swelling seen here, but we photographed this area and subsequently  biopsied around the squamocolumnar junction to evaluate for Barrett's,  entered into the stomach, fundus, body, antrum, duodenal bulb, second  portion duodenum were visualized. From this point, the endoscope was slowly  withdrawn taking circumferential views of duodenal mucosa until the  endoscope had been pulled back into the stomach, placed in retroflexion to  view the stomach from below. The endoscope was then straightened and  withdrawn taking circumferential views of the remaining gastric and  esophageal mucosa. The patient's vital signs, pulse oximeter remained  stable. The patient tolerated the procedure well without apparent  complications.   FINDINGS:  Changes of probable esophagitis in the distal esophagus,  biopsied. Await biopsy report. The patient will call me for results and  follow-up with me as an outpatient. Proceed to colonoscopy as planned.      GMO/MEDQ  D:  06/14/2004  T:  06/14/2004  Job:  [id number]   cc:   Luna Kitchens. Redmond Pulling, M.D.  [address]  [place]  [place] [address]  Fax: [phone]   [place] Rollene Fare, M.D.  [phone] [address], [place] [address]  Fax: [place]

## 2010-05-26 NOTE — Op Note (Signed)
   NAME:  CHRYS, PIEHLER NO.:  [id number]   MEDICAL RECORD NO.:  [id number]                   PATIENT TYPE:  AMB   LOCATION:  ENDO                                 FACILITY:  [facility]   PHYSICIAN:  Waverly Ferrari, M.D.                 DATE OF BIRTH:  [birth year]   DATE OF PROCEDURE:  03/20/202004  DATE OF DISCHARGE:                                 OPERATIVE REPORT   PROCEDURE:  Colonoscopy.   INDICATIONS:  Hemoccult positivity.   ANESTHESIA:  Demerol 10 mg, Versed 2 mg.   PROCEDURE:  With the patient mildly sedated in the left lateral decubitus  position, the Olympus videoscopic colonoscope was inserted in the rectum and  passed under direct vision to the cecum, identified by ileocecal valve and  appendiceal orifice, both of which were photographed.  From this point the  colonoscope was slowly withdrawn, taking circumferential views of the entire  colonic mucosa, stopping only in the rectum, which appeared normal on direct  and retroflexed view.  The endoscope was straightened and withdrawn.  The  patient's vital signs and pulse oximetry remained stable.  The patient  tolerated the procedure well without apparent complications.   FINDINGS:  This was an unremarkable colonoscopic examination to the cecum.   PLAN:  See endoscopy note for further details.                                               Waverly Ferrari, M.D.    GMO/MEDQ  D:  03/20/202004  T:  03/20/202004  Job:  [id number]   cc:   Jeneen Rinks L. Deterding, M.D.  [place]  [place] [address]  Fax: [phone]

## 2010-05-26 NOTE — Op Note (Signed)
NAME:  [name] [name], [name] [name] NO.:  [id number]   MEDICAL RECORD NO.:  [id number]          PATIENT TYPE:  INP   LOCATION:  2315                         FACILITY:  [facility]   PHYSICIAN:  Tharon Aquas Trigt III, M.D.DATE OF BIRTH:  [birth year]   DATE OF PROCEDURE:  03/27/2004  DATE OF DISCHARGE:                                 OPERATIVE REPORT   OPERATION:  Redo coronary artery bypass grafting x2 (saphenous vein graft to  obtuse marginal 1, saphenous vein graft to obtuse marginal 2).   SURGEON:  Ivin Poot, M.D.   PREOPERATIVE DIAGNOSIS:  Class IV unstable angina with progressive recurrent  coronary artery disease, status post coronary artery bypass graft in 1992 by  Dr. Redmond Pulling.   POSTOPERATIVE DIAGNOSIS:  Class IV unstable angina with progressive  recurrent coronary artery disease, status post coronary artery bypass graft  in 1992 by Dr. Redmond Pulling.   CHIEF COMPLAINT:  Chest pain.   HISTORY OF PRESENT ILLNESS:  Mr. Snitzer is a 70 year old diabetic who  underwent coronary bypass grafting by Dr. Redmond Pulling and 1992.  He returned to  the hospital with unstable angina and mildly elevated cardiac enzymes.  His  ejection fraction was 50% and he had a patent mammary artery graft to the  LAD-diagonal as a sequential graft.  His vein graft to the circumflex system  was occluded.  The right coronary had non-significant stenosis.  He was not  felt to be candidate for revascularization of the circumflex system  percutaneously and a surgical evaluation was requested.   Prior to surgery, I examined the patient in his hospital room and reviewed  the results of the cardiac catheterization with the patient and his family.  I discussed the indications and expected benefits of redo coronary bypass  surgery for treatment of his coronary disease.  I discussed the alternatives  to redo coronary bypass surgery and the expected outcome of those  alternative therapies.  I discussed with the patient  the major aspects of  the planned procedure including the use of general anesthesia, the location  of the surgical incisions, the plan to use endoscopically harvested  saphenous vein from the left leg, the use of cardiopulmonary bypass, and the  expected postoperative hospital recovery.  The patient understood this as a  redo operation, he would be at high risk for postoperative complications  including stroke, bleeding, MI, infection, and death.  He understood these  factors for the surgery and agreed to proceed with operation as planned  under what I felt was an informed consent.   FINDINGS:  Access to the posterior left ventricle to graft the 2 circumflex  vessels was difficult due to scarring, his body habitus, and cardiomegaly.  The circumflex vessels were heavily diseased as well.  The saphenous vein  was harvested from the entire of left leg using both endoscopic technique  for the upper vein and open technique for the lower vein.  The vein was of  average quality.  The patient required 2 units packed cells in the operating  room for a hemoglobin and 7 grams.  The  patient did not receive aprotinin  due to his preoperative elevated creatinine of 2.7.  The patient's diabetes  was treated with a Glucomander protocol.   PROCEDURE:  The patient was brought to operating room and placed supine on  the operating table.  General anesthesia was induced under invasive  hemodynamic monitoring.  The chest, abdomen and legs were prepped with  Betadine and draped as a sterile field.  A sternal incision was made using  the oscillating saw with care being taken to avoid injury to the underlying  vascular structures.  The left saphenous vein was harvested endoscopically  from the thigh and using open technique from the lower leg due to its  superficial location.  The anterior mediastinum was carefully dissected to  expose the ascending aorta and right atrium.  Further dissection was  performed  using the mammary Barr retractor to identify the mammary pedicle,  which was encircled with a Vesseloop.  EKG changes were not noted during the  pre-pump dissection.  When the maximal dissection could be performed off  pump was a complete, then heparin was administered and ACT was documented as  being therapeutic. Pursestrings were placed in the ascending aorta, in the  proximal arch as well as in the right atrium and the patient was cannulated.  He was placed on bypass and the remainder of the dissection was carried out  on the anterior surface without lifting the heart.  The saphenous veins were  harvested, flushed, and prepared for the distal anastomoses.  Cardioplegia  catheters were placed for both antegrade aortic and retrograde coronary  sinus cardioplegia.  The patient was cooled to 30 degrees.  The aortic  crossclamp was applied.  Eight hundred milliliters of cold blood  cardioplegia were delivered in split doses between the antegrade aortic and  retrograde coronary sinus catheters.  While the aortic crossclamp was in  place, the mammary pedicle was occluded with a vascular bulldog.  There is  good cardioplegic arrest and septal temperature dropped to less than 12  degrees.  Topical iced saline was used to augment myocardial preservation.   The remainder of the posterior dissection was performed to completely  mobilize the heart.  The 2 circumflex vessels were identified and dissected.  The distal coronary anastomoses were performed.  The first distal  anastomosis was the OM-2.  This was a 1.5-mm vessel with heavily diseased  plaque in the wall of the vessel.  A reversed saphenous vein was sewn end-to-  side with running 7-0 Prolene and there was good flow through graft.  The  second distal anastomosis was the OM-1.  This also heavily diseased and the  anastomosis was placed just distal to the previously placed vein graft which had been occluded.  A running 7-0 Prolene was used to  construct the  anastomosis and there was good flow through the graft.  Cardioplegia was  redosed.   While the crossclamp was still in place, 2 proximal anastomoses were placed  on the ascending aorta at the sites of the previous 2 proximal anastomoses.  Air was vented from the coronaries in the left side of heart using a dose of  retrograde warm blood cardioplegia as well as the usual de-airing maneuvers  on bypass.  The bulldog on the mammary pedicle was released and there was  immediate significant rise in septal temperature.  After air had been  vented, the last proximal anastomosis was tied down and the crossclamp was  removed.   The heart  was cardioverted back to a regular rhythm.  Air was aspirated from  the 2 vein grafts using a 27-gauge needle and the vascular bulldogs were  removed on the vein grafts and they were perfused.  Each had good flow and  hemostasis was documented at the proximal and distal anastomoses.  The  patient was reperfused and rewarmed for the next 25 minutes.  Temporary  pacing wires were applied.  The mammary was inspected and found to be patent  with a good pulse.  The ventricle looked vigorous.  The lungs re-expanded  and ventilator was resumed.  When the patient was warm, he was weaned from  bypass on renal-dose dopamine, which was continued throughout the entire  operation due to elevated preoperative creatinine of 2.6.  The patient  remained hemodynamically stable and cardiac output was 4-5 L per minute.  Protamine was administered without adverse reaction.  The cannulas were  removed.  There was still significant diffuse oozing and platelets and FFP  were administered with improved coagulation function.  The patient remained  hemodynamically stable.  The mediastinum was irrigated warm antibiotic  irrigation.  The leg incisions were irrigated and closed in a standard  fashion.  The superior pericardial fat was closed over the aorta and vein  grafts.   Two mediastinals and right pleural chest tube were placed and  brought out through separate  incisions.  The sternum was closed with interrupted steel wire.  The  pectoralis fascia was closed in running #1 Vicryl.  The subcutaneous and  skin layers were closed with a running Vicryl and sterile dressings were  applied.  Total bypass time was 150 minutes with crossclamp time 100  minutes.      PV/MEDQ  D:  03/27/2004  T:  03/28/2004  Job:  [id number]   cc:   Delfino Lovett A. Rollene Fare, M.D.  [phone] [address], [place] [address]  Fax: [phone]

## 2010-05-26 NOTE — Discharge Summary (Signed)
[place]  Patient:    [name] [name], [name] [name]                       MRN: [id number] Adm. Date:  [id number] Disc. Date: [id number] Attending:  Epifanio Lesches Dictator:   Berlin Hun, P.A. CC:         Joyice Faster. Deterding, M.D.  Stanley C. Redmond Pulling, M.D.   Discharge Summary  [facility] MEDICAL RECORD NUMBER:  [phone]  ADMISSION DIAGNOSES: 1. Recurrent angina, rule out myocardial infarction. 2. Known coronary artery disease, status post coronary artery bypass grafting. 3. Hypertension. 4. Diabetes requiring insulin. 5. Hyperlipidemia. 6. History of cerebrovascular accident. 7. Chronic renal insufficiency.  DISCHARGE DIAGNOSES: 1. Chest pain resolved, myocardial infarction ruled out with negative enzymes,    status post percutaneous transluminal coronary angioplasty and stenting of    circumflex. 2. Known coronary artery disease, status post coronary artery bypass grafting. 3. Hypertension. 4. Diabetes requiring insulin. 5. Hyperlipidemia. 6. History of cerebrovascular accident. 7. Chronic renal insufficiency.  HISTORY OF PRESENT ILLNESS:  [name] [name] is a 70 year old, married, white, male patient of Richard A. Rollene Fare, M.D., who is on disability secondary to coronary artery disease.  He underwent coronary artery bypass grafting on August 22, 1999, with LIMA to the LAD, SVG to the OM2, and SVG to diagonal. He has a history of chronic stable angina.  He was last seen in 2001, at which time he was having some mild recurrence of angina.  He went on to have a rest Persantine Cardiolite which was negative.  Today, January 29, 2000, he comes to the office for follow-up of his cardiovascular disease.  He states that he now has angina with any activity. He does not have rest angina.  His worst episode was last Thursday and lasted four hours.  He used nine nitroglycerin over that time.  He recently saw Jeneen Rinks L.  Deterding, M.D., secondary to chronic renal insufficiency, who has recommended that if he undergo cardiac catheterization, he should receive Mucomyst and fluids prior to catheterization.  We will have [name] [name] stop his Coumadin today.  He will be admitted to [facility] tomorrow for fluid therapy.  IV heparin and IV nitroglycerin.  He will be started on Mucomyst on Wednesday and Felodipine four hours prior to his cardiac catheterization.  The procedure was discussed and the risks and benefits were explained.  He was agreeable to proceed.  PROCEDURES:  Cardiac catheterization in intervention to the circumflex on February 01, 2000, by Richard A. Rollene Fare, M.D.  CONSULTATIONS:  Nephrology.  COMPLICATIONS:  None.  COURSE IN THE HOSPITAL:  [name] [name] was admitted to [facility]. [facility] on January 30, 2000, due to recent angina exacerbation.  He was started on IV heparin and nitroglycerin (INR 2.0 on admission).  ADMISSION LABORATORY STUDIES were potassium 4.3, BUN 29, and creatinine 1.8. LFTs within normal limits.  WBC 9.3, hemoglobin 13.8, platelets 312.  CK 115, MB 7.9.  Total cholesterol 110, triglycerides 152, HDL 20, and LDL 60.  TSH low at 0.123.  The patient has had no chest pain since admission.  He has been at rest and not exerting himself.  Mucomyst and IV hydration were started and diuretics were held on the morning of procedure.  A renal consult was called on January 31, 2000, to assist [place] in management of his chronic renal insufficiency around the time of catheterization.  This was  performed by Windy Kalata, M.D.  He recommended IV fluids and holding Lasix and enalapril.  He recommended Mucomyst 600 mg the night before and then again in the morning before the catheterization.  He wanted [place] to use as little dye as possible and to follow up his renal function.  On February 01, 2000, the patient was taken to the cardiac  catheterization. The INR was 1.6.  Cardiac catheterization revealed the following:  1. The left main was relatively short with nonobstructive calcification.  2. The LAD had a 70% proximal disease with a 95-99% stenosis before the first septal perforator branch and was totally occluded beyond this with no antegrade filling.  3. The optional diagonal vessel had 90% segmental proximal stenosis and was small. 4. The proximal circumflex had narrowing of 70-80% with calcification from the ostium across the small septal to the proximal third.  5. The mid circumflex had a 99% "daylight" mildly segmental stenosis after the PABG branch.  The OM3 had less than 30% narrowing in the area of remote PTCA (April 03, 1995). 6. The RCA was dominant with segmental irregularity in the proximal third of 20-30%.  In the mid portion, there was 30-40% smooth narrowing.  There was 40% narrowing beyond the acute marginal.  There was 60-70% narrowing of the proximal PLA.  There was 40% narrowing of the proximal to mid PDA.  Tandem SV flow through the RCA.  7. There were no significant collaterals from the RCA to the circumflex or diagonal.  8. The SVG to the proximal diagonal branch was totally occluded just beyond the origin with no antegrade flow.  9. The saphenous vein graft to the second obtuse marginal branch was widely patient with excellent anastomosis and good filling.  It filled the circumflex retrograde and distally, demonstrating a 99% mid circumflex stenosis with jeopardized distal circumflex.  10. The LIMA was widely patent with an excellent anastomosis.  There was a 70% segmental narrowing beyond the insertion.  It was felt that his severe recalcitrant angina on maximum medical therapy could possibly be due to high-grade circumflex disease.  It was felt best to proceed with attempted PCI for pain relief and relief of ischemia.  The risks of dye administration were fully explained preoperatively and  the  family was also in agreement.  Aggrastat was bolused and infused and IV heparin was given.  Richard A. Rollene Fare, M.D., proceeded with complex intervention to the circumflex vessel ultimately succeeding in reduction of proximal circumflex stenosis of 80% reduced to less than 30-40% with good flow and no dissection. The mid circumflex had a 99% stenosis which was reduced to less than 30% residual.  There was a small dissection here which was fairly well "tacked up" using inflations with a Cross Sail balloon.  There was good TIMI-3 flow across the circumflex artery and the small marginal side branch and PABG branches were intact.  A total of 50-60 cc of dye was used for the intervention (please see the catheterization report for full details of intervention).  Fenoldopam was given prior and then four hours post procedure.  Richard A. Rollene Fare, M.D., felt that the patient may be a candidate for EECP if his angina recurs.  He is not a reoperative candidate.  He did not feel that the LAD beyond the LIMA was tight enough to warrant further intervention or dye administration at this time.  He felt that a follow-up 2-D echocardiogram and thallium study may be helpful as baseline in the future.  The patient tolerated the procedure well and had no further chest pain.  On February 02, 2000, the patients BUN and creatinine were stable at 29 and 1.7, respectively.  Hemoglobin 11.9.  He was seen with catheterization rehabilitation and tolerated this well.  On February 03, 2000, the BUN was 32 and creatinine 1.7.  The patient was felt stable for discharge to home from both the renal doctors and [facility] doctors.  It was recommended that aspirin and Plavix be continued and that Coumadin not be restarted until the patient is off of Plavix.  DISCHARGE MEDICATIONS:  1. Vasotec 5 mg a day.  2. Lasix 40 mg a day.  3. Hytrin 5 mg a day.  4. Lopressor 25 mg  b.i.d.  5. Imdur 60 mg a day.  6. Synthroid 0.113 mg at day.  7. Protonix 40 mg a day.  8. Norvasc 5 mg a day.  9. K-Dur 20 mEq a day. 10. Tricor 160 mg at night. 11. Niaspan 500 mg four at bedtime. 12. Insulin as before. 13. Nitroglycerin as needed for chest pain. 14. Enteric-coated aspirin 325 mg a day. 15. Plavix 75 mg a day for a month.  SPECIAL INSTRUCTIONS:  The patient has been instructed not to take Coumadin until seen back.  ACTIVITY:  No strenuous activity for the next two to three days.  DIET:  Low-fat, low-cholesterol, low-salt diabetic diet.  WOUND CARE:  May shower.  FOLLOW-UP:  He is asked to call the office with any problems or questions.  He is asked to call Dr. Nanine Means. [facility] for a two-week follow-up appointment.  They also need to call James L. Deterding, M.D., and Luna Kitchens. Redmond Pulling, M.D., for appointments. DD:  03/15/00 TD:  03/17/00 Job: [id number]

## 2010-05-26 NOTE — Op Note (Signed)
   NAME:  [name] [name], [name] [name] NO.:  [id number]   MEDICAL RECORD NO.:  [id number]                   PATIENT TYPE:  AMB   LOCATION:  ENDO                                 FACILITY:  [facility]   PHYSICIAN:  Waverly Ferrari, M.D.                 DATE OF BIRTH:  [birth year]   DATE OF PROCEDURE:  DATE OF DISCHARGE:                                 OPERATIVE REPORT   PROCEDURE:  Upper endoscopy.   INDICATIONS FOR PROCEDURE:  Gastric ulcer with bleeding.   ANESTHESIA:  Demerol 80, Versed 10 mg.   DESCRIPTION OF PROCEDURE:  With the patient mildly sedated in the left  lateral decubitus position, the Olympus videoscopic endoscope was inserted  in the mouth and passed under direct vision through the esophagus, which  appeared normal. Other than the distal esophagus, there was a question of a  tongue of Barrett's esophagus, which was photographed and biopsied. We  entered into the stomach. The fundus, body, antrum, duodenal bulb and second  portion of the duodenum all were visualized and no abnormalities were noted.  From this point, the endoscope was slowly withdrawn taking circumferential  views of the duodenal mucosa until the endoscope was then pulled back in the  stomach, placed in retroflexion to view the stomach from below. The  endoscope was then straightened and withdrawn taking circumferential views  of the remaining gastric and esophageal mucosa. The patient's vital signs  and pulse oximeter remained stable. The patient tolerated the procedure well  without apparent complications.   FINDINGS:  Question of Barrett's esophagus biopsied, await biopsy report.  The patient will call me for results and followup with me as an outpatient.                                               Waverly Ferrari, M.D.    GMO/MEDQ  D:  09/07/2002  T:  09/07/2002  Job:  [id number]

## 2010-05-26 NOTE — Cardiovascular Report (Signed)
NAME:  HAVISH, MARLING NO.:  [id number]   MEDICAL RECORD NO.:  [id number]          PATIENT TYPE:  INP   LOCATION:  4736                         FACILITY:  [place]   PHYSICIAN:  Jeanella Craze. Little, M.D. DATE OF BIRTH:  Jul 16, 1940   DATE OF PROCEDURE:  03/22/2004  DATE OF DISCHARGE:                              CARDIAC CATHETERIZATION   INDICATIONS:  This 70 year old male presents with progressive angina. He had  bypass surgery performed by Dr. Redmond Pulling in 1992 and in 2002 had intervention  to his native circumflex by Dr. Nanine Means. Rollene Fare. The patient presents  with significant worsening angina including angina at arrest and angina with  minimal activity just turning over in bed. He is brought to the  catheterization lab for cardiac catheterization.   The patient has renal insufficiency with creatinine of 2.8. The patient was  given considerable information about the potential risks of contrast media  with his renal insufficiency. He understands and accepts this risk. Because  of this, however, Visipaque was used. He was also given Mucomyst and sodium  bicarbonate drip prior to the catheterization.   DESCRIPTION OF PROCEDURE:  The patient was prepped and draped in usual  sterile fashion exposing the right groin, following local anesthetic with 1%  Xylocaine. The Seldinger technique employed and a 5-French introducer sheath  was placed into the right femoral artery. Left and right coronary  arteriography was performed. Graft visualization was performed. Pressure  monitoring within the left ventricle was performed but no ventriculogram was  performed because of his renal insufficiency.   An echocardiogram which was performed March 20, 2004 showed a 40-50%  ejection fraction, trivial mitral regurgitation.   HEMODYNAMIC DATA:  1.  His central aortic pressure was 133/69.  2.  His left ventricular pressure was 123XX123 and end-diastolic pressure was      12.   CORONARY  ANGIOGRAM:  1.  Left main normal.  2.  LAD:  The LAD had a long subtotaled segment in its proximal and ostial      segment. This ended before the bifurcation of the OM1. OM1 actually had      a saphenous vein graft and the distal portion of this graft would fill      with injection of the native. The ongoing circumflex gave rise to OM2      and there was an area of 50% narrowing in the proximal portion of OM2      which was a prior angioplasty site.  3.  LAD:  The LAD was 100% occluded after first diagonal and septal      perforator. The proximal diagonal which was very small had an 80%      proximal narrowing. The distal LAD filled via the left internal mammary      artery.  4.  Right coronary artery: There was mild irregularities throughout the      entire RCA. There was 40% narrowing of the ostium of posterior lateral      branch and there was mild irregularities in the PDA itself. Of note,  there were no collaterals from the right coronary artery to the LAD or      circumflex system.   GRAFTS:  1.  Saphenous vein graft to the diagonal 100% occluded (this was noted to      2002).  2.  Saphenous vein graft to the OM 100% occluded proximally (this is new      since 2002).  3.  Left internal mammary artery to the LAD: The left internal mammary      artery was widely patent. The diagonal was widely patent and the LAD had      an area of 70% narrowing distal to the insertion site of the left      internal mammary artery.   CONCLUSION:  Loss of both the saphenous vein grafts. Severe disease in the  proximal and ostial circumflex that is not amenable to intervention.  Moderate disease distal in the distal portion of the left anterior  descending artery after the bypass graft. I have asked CVTS to evaluate the  patient for redo bypass surgery. I do not see anything that can be safely  addressed from a percutaneous interventional standpoint. Particularly in  view of his renal  insufficiency any type of intervention would probably  result in substantial contrast use and substantial renal injury.   It should be pointed out that 5-French diagnostic catheters were used  including the right coronary catheter was cannulated the left internal  mammary artery.   TOTAL CONTRAST USED:  60 cc Visipaque. end of the note on Odas Blaske thank  you very much.      ABL/MEDQ  D:  03/22/2004  T:  03/22/2004  Job:  [id number]   cc:   Delfino Lovett A. Rollene Fare, M.D.  [phone], [place] [address]  Fax: [phone]   Donato Heinz, M.D.  [place]  [place] [address]  Fax: [phone]

## 2010-05-26 NOTE — Cardiovascular Report (Signed)
[place]. [facility]  Patient:    [name] [name], [name] [name]                         MRN: [id number] Proc. Date: 02/01/00 Attending:  Delfino Lovett A. Rollene Fare, M.D. CC:         Cardiac Catheterization Lab  [place] C. Redmond Pulling, M.D.  James L. Deterding, M.D.  Windy Kalata, M.D.   Cardiac Catheterization  PROCEDURES:  Retrograde central aortic catheterization, limited selective coronary angiography, vein graft angiography, selective left internal mammary artery using Isovue dye.  No left ventriculogram performed.  Intracoronary nitroglycerin administration.  Culprit lesion percutaneous transluminal coronary angioplasty, high-grade mid circumflex stenosis and high-grade ostial and proximal circumflex stenosis segmental, preoperative hydration, Mucomyst therapy, fenoldopam IV prophylaxis.  Aggrastat bolus plus infusion, weight-adjusted heparin 5000 units, IV Lopressor a total of 7.5 mg, IC nitroglycerin 500 mcg.  DESCRIPTION OF PROCEDURE:  The diagnostic procedure was done through a 6-French short.  Daig sidearm sheath placed in the RFA with a single anterior puncture using a modified Seldinger technique with the patient in the postabsorptive state.  He was not on ACE inhibitors.  Diuretics were held, and he had been given Mucomyst b.i.d. for 2 days.  He was also started on fenoldopam beginning at 0.05 mcg/kg/min titrating up for a blood pressure of approximately 123XX123 systolic for 2 hours prior to the procedure.  Drip infusion continued during the procedure and anticipate duration of 4 hours post procedure.  Creatinine preoperatively was 1.9 and had been stable with BUN 30.  Diagnostic coronary angiography was done with limited injections.  Isovue ______ , nonionic contrast was used throughout the procedure.  Heparin infusion was stopped on call to the catheterization lab.  Selective vein graft angiography was done with the right coronary artery,  and selective LIMA was done with an IMA catheter, also a 6-French.  Limited injections were used in an attempt to conserve dye, and approximately 50-60 cc were used for the entire diagnostic procedure.  Prior to the procedure, the patient had chest pain requiring IV nitroglycerin, Nubain, and titration of his antihypertensive medications.  Blood pressure preoperatively was 170-190, and we brought it down to 100-110 at the time of the procedure, and it remained stable during the procedure.  The patient remained in sinus rhythm, had relatively persistent sinus tachycardia from 80-110 and received a total of 7.5 mg of IV Lopressor in divided doses during the procedure.  A fluoroscopy revealed 2-3+ coronary calcification diffusely.  There was no intracardiac or significant valvular calcification seen.  LV angiogram was not performed due to dye consideration.  PRESSURES: LV:  123/8; LVEDP 22 mmHg. CA:  114/50 mmHg.  There was approximately 6 mmHg mean gradient between the LV and CA on catheter pullback compatible with very mild aortic stenosis.  There was no problem passing the pigtail catheter into the LV, but several pullbacks were performed to confirm this small gradient.  CORONARY ANGIOGRAPHY: Native coronary anatomy: 1. The main left coronary artery was relatively short but normal with    calcification nonobstructive. 2. The left anterior descending had segmental 70% proximal with diffuse    disease in a small vessel and 95-99% stenosis before the first septal    perforator branch, and it was totally occluded beyond this with no    antegrade filling. 3. The optional diagonal vessel had 90% segmental proximal stenosis and was a    very thin, relatively small vessel. 4. The proximal  circumflex had tubular narrowing of 70-80% with calcification    from the ostium across the small septal through the proximal third. 5. The mid circumflex artery had 99% "daylight" mildly segmental  stenosis    after the PABG branch.  The OM-3 had less than 30% narrowing in the area of    remote PTCA (April 03, 1995). 6. The right coronary artery was a dominant large vessel with segmental    irregularity in the proximal third, 20-30% diffuse narrowing that was    smooth.  In the mid portion, there was diffuse 30-40% smooth narrowing.    There was 40% narrowing beyond the acute margin.  There was 60-70%    narrowing of the proximal PLA.  There was 40% narrowing of the proximal to    mid PDA.  There was good TIMI-3 flow throughout the right coronary artery    with no high-grade obstruction. 7. There were no significant collaterals from the right to the circumflex or    diagonal vessels. 8. The saphenous vein graft to the proximal diagonal branch was totally    occluded just beyond the origin with no antegrade flow. 9. The saphenous vein graft to the second obtuse marginal branch was widely    patent, smooth, excellent anastomosis to this branch with good filling.    It filled the circumflex retrograde and distally demonstrating a 99% mid    circumflex stenosis with the jeopardized distal circumflex. 10. The LIMA was widely patent with an excellent anastomosis to the mid LAD.     There was approximately 70% segmental narrowing of the LAD beyond the     insertion, and there were irregularities of the remainder of the LAD that     coursed just to the apex and was only of moderate size.  The patient has not had overt congestive heart failure, and he had a good EF at prior Cardiolite in July 2001.  Since that time, he has had progressive angina, and over the last 6-8 weeks has had severe angina on very minimal exertion including rest episodes often requiring 3-4 nitroglycerin for relief. They are typical and characterized by substernal chest pain radiating to the  left chest and occasionally to the interscapular area.  Mr. Gilford Rile is disabled, retired from [employer], has a history of  insulin-dependent diabetes, hyperlipidemia, hypertension.  He has renal insufficiency that has been chronic and prior to hospitalization, his creatinine was in the 2.2 range.  He has been followed by Dr. Clair Gulling Deterding who also noted his significant angina.  The patient has had remote CABG x 3 by Dr. Redmond Pulling, August 12, 1990.  His last catheterization and intervention was April 03, 1995, when he had PTCA of the mid circumflex and the proximal OM-3 with 2.5 balloon for significant angina done as a culprit measure.  At that time, the graft to the diagonal was patent, but it was a very small diagonal branch.  Fortunately, the patient does not have any high-grade disease of his right coronary artery.  He has a patent LIMA to the LAD and although there was disease beyond there, there is good flow through the LAD and good retrograde flow to a diagonal branch which has a 50% ostial lesion.  He also has good flow through the graft to the OM-2.  It is possible that his severe recalcitrant angina on maximum medical therapy is being caused by his high-grade circumflex disease.  In view of recurrent episodes of angina in the hospital on maximum  medical therapy, it was felt best to proceed with attempted PCI for relief of pain and ischemia in this setting.  Informed consent was obtained, and the patient also knew of the risks of dye administration that had been fully explained preoperatively.  The family was also in agreement.  The patient was begun on Aggrastat bolus and infusion, given 5000 units of intravenous heparin.  The left coronary was intubated with a JL4 6-French guiding catheter.  A 0.010-inch tip ACS Cross-Sit guide wire was utilized to cross into the calcified circumflex.  With this wire, we were able to recanalize the daylight mid circumflex stenosis and position it in the distal vessel.  A 2.0 x 15 Sci-med Maverick balloon was used to cross into the mid circumflex stenosis.  An  inflation was done at 9-47.  Following this, attempt at getting a 2.25 small vessel ACS Pixel stent to cross into the circumflex was not successful because we were not able to enter the circumflex vessel through the guiding catheter.  For this reason, the balloon was upgraded to a 2.25 x 15 ACS OpenSail balloon. This was positioned across the proximal circumflex stenosis which was dilated in a graded fashion from 7 to 10 atmospheres for 30 to 40 seconds.  A 2.5 balloon was used to dilate the mid vessel at 6 atmospheres for 15 seconds.  Following this, there was localized dissection at the mid circumflex site.  A further attempt to pass the Pixel 2.25 stent was unsuccessful, and a 2.25 short BX Velocity stent also would not enter into the circumflex.  I think this was due to the angulation, extreme rigidity, and calcification of the vessel and associated tortuosity.  This was tried over an upgraded 0.014-inch Mailman stiff guide wire that was exchanged through the over-the-wire balloon to provide better support.  Despite this, the stent would not cross.  We then recrossed the mid dissected area with a 2.5 x 10 ACS CrossSail balloon and relatively low pressure inflations were done at 6-62 and 6-60 with good sealing of the dissection on angiography.  The balloon was pulled back to the ostial and proximal circumflex, and inflations were done at 5-28 and 5-52.  The balloon was removed, and final injection showed the proximal circumflex stenosis of calcific 80% reduced to less than 30-40% with good flow and no dissection.  The mid circumflex 99% stenosis was reduced to less than 30% residual.  The dissection was fairly well "tacked up".  There was good TIMI-3 flow throughout the circumflex artery, and the small side branch marginal and PABG branches were intact.  At the end of the procedure, the patient was pain-free.  Despite this complex circumflex intervention and difficulties  as described, we tried to use extremely limited dye throughout the procedure, and it was estimated that the patient received approximately 50-60 cc of dye for the intervention.  He will need to be followed up carefully postoperatively for development of renal insufficiency.  We plan to continue fenoldopam for 4 hours post procedure and then taper him off this.  We will continue medical therapy of his residual coronary disease. The patient may be a candidate for EECP for his angina if this recurs.  He has a very difficult situation.  He is not a re-operative candidate.  I did not feel that the LAD lesion beyond the LIMA was tight enough to warrant any further intervention or dye administration at this time.  Follow-up 2D echocardiogram and thallium might be helpful as a  baseline at this time as well.  CATHETERIZATION DIAGNOSES: 1. Arteriosclerotic heart disease, chronic, with severe unstable angina at    rest and minimal exertion on maximum medical therapy. 2. Insulin-dependent diabetes. 3. Hyperlipidemia. 4. Systemic hypertension (normal renal artery Doppler). 5. Chronic renal insufficiency, probably related to hypertension and diabetes. 6. Status post coronary artery bypass graft x 3, Dr. Redmond Pulling, August 22, 1990. 7. Patent left internal mammary artery graft to left anterior descending,    patent saphenous vein graft to obtuse marginal #2, high-grade proximal and    mid calcific native circumflex stenosis, no significant native right    coronary artery stenosis, occluded saphenous vein graft to diagonal since    last intervention of March 1997. 8. Remote percutaneous transluminal coronary angioplasty, April 03, 1995, mid    circumflex with a new lesion at this study and obtuse marginal #3 with no    restenosis at this study.DD:  02/01/00 TD:  02/01/00 Job: [id number]

## 2010-05-26 NOTE — Procedures (Signed)
[place]. [facility]  Patient:    [name] [name], [name] [name]                       MRN: [id number] Adm. Date:  [id number] Attending:  Jim Desanctis CC:         [name], M.D.   Procedure Report  PROCEDURE:  Colonoscopy.  INDICATIONS:  Colon cancer, previously resected.  This procedure was done to find the area of resection of the malignant polyp so in order to mark it for surgical resection.  ANESTHESIA:  Demerol 100 mg, Versed 10 mg.  DESCRIPTION OF PROCEDURE:  With patient mildly sedated in the left lateral decubitus position, the Olympus videoscopic colonoscope was inserted into the rectum and passed under direct vision to the area of ulceration where we had previously resected a malignant polyp.  This area was then photographed, and subsequently we marked this area with two clips that we attached adjacent to the ulcerated area.  This was done with C-arm guidance, and we will get a portable film subsequently to localize the scope and the clips so that we can have a more accurate visualization of the area in question that needs to be resected.  From this point the colonoscope was then slowly withdrawn, taking circumferential views of the remaining colonic mucosa.  The patients vital signs and pulse oximetry remained stable.  The patient tolerated the procedure well with no apparent complications.  FINDINGS:  Ulceration at previous biopsy site that is marked for resection. DD:  08/13/00 TD:  08/14/00 Job: [id number]

## 2010-05-26 NOTE — Procedures (Signed)
[place]. [facility]  Patient:    [name] [name], [name] [name]                       MRN: [id number] Proc. Date: 08/06/00 Adm. Date:  [id number] Attending:  Jim Desanctis CC:         Joyice Faster. Deterding, M.D.  Stanley C. Redmond Pulling, M.D.  Richard A. Rollene Fare, M.D.   Procedure Report  PROCEDURE PERFORMED:  Upper endoscopy.  ENDOSCOPIST:  Jim Desanctis, M.D.  INDICATIONS FOR PROCEDURE:  Iron deficiency anemia, heme positive stools.  ANESTHESIA:  Demerol 75 mg, Versed 6 mg.  DESCRIPTION OF PROCEDURE:  With the patient mildly sedated in the left lateral decubitus position, the Olympus video endoscope was inserted in the mouth and passed under direct vision through the esophagus, which appeared normal, into the stomach.  The fundus and body appeared normal as did the antrum.  However, there were blood flecks seen in the antrum and this was photographed.  We entered into the duodenal bulb and it was quite edematous and erythematous consistent with fairly moderately severe duodenitis.  We were able to pass through the edematous folds into the second portion of the duodenum which appeared normal.  We slowly withdrew the endoscope taking circumferential views of the duodenal mucosa and no clear ulcer was seen despite our careful looking.  The endoscope was then pulled back into the stomach and placed on retroflexion to view the stomach from below.  The endoscope was then straightened and withdrawn taking circumferential views of the entire gastric and esophageal mucosa, stopping only to take a biopsy for Helicobacter pylori from the antrum of the stomach.  The remainder of the stomach and esophagus appeared normal.  Patients vital signs and pulse oximeter remained stable. The patient tolerated the procedure well without apparent complications.  FINDINGS:  Blood flecks and streaks in the stomach presumably from the moderately severe duodenitis that was noted.  Helicobacter pylori  study obtained.  PLAN:  Will treat according to results of Helicobacter pylori study, but the patient will probably benefit by proton pump inhibitor therapy.  Will proceed to colonoscopy as planned and have patient follow up with me as an outpatient. DD:  08/06/00 TD:  08/06/00 Job: [id number]

## 2010-05-26 NOTE — Discharge Summary (Signed)
[place]. [facility]  Patient:    [name] [name], [name] [name] Visit Number: [id number] MRN: [id number]          Service Type: SUR Location: [id number] Attending Physician:  Oletha Cruel Dictated by:   [name], M.D. Admit Date:  09/19/2000 Discharge Date: 09/25/2000   CC:         Richard A. Rollene Fare, M.D.  Dr. Mcarthur Rossetti, M.D.  Stanley C. Redmond Pulling, M.D.   Discharge Summary  OFFICE MEDICAL RECORD # [id number]  FINAL DIAGNOSES: 1. 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 2. Chronic renal failure. 3. Insulin-dependent diabetes. 4. Coronary artery disease.  CLINICAL HISTORY:  [name] is a 70 year old gentleman who recently had a colonoscopy.  Was found to have carcinoma in a polyp which was in the mid transverse colon, although initially identified as descending colon.  After a cardiac evaluation to be sure he was stable for surgery, he completed a bowel prep at home and was brought into the hospital for surgical intervention.  HOSPITAL COURSE:  Patient was admitted and taken to the operating room where a distal transverse proximal descending colon resection with primary anastomosis was performed.  He tolerated the procedure fairly well.  He was seen in consultation both by renal and medical services.  His diabetes was managed with sliding scale.  He was replaced on some of his cardiac medications. Initially his potassium elevated, but then came back down to normal.  His creatinine did bump up to 3.5 and then came back down to 1.5 by discharge. His hemoglobin postoperative remained stable.  He was maintained n.p.o. postoperatively.  He was begun on aspirin because of a prior history of strokes.  He appeared stable, but was maintained on a monitor for a couple of days.  He continued to improve regarding his renal status.  By the third postoperative day he had a benign abdomen so no diet was started.  By the fourth postoperative  day he was passing a little bit of gas and had begun on a liquid diet.  This was tolerated nicely and diet was then advanced to solids on September 17.  He continued to tolerate those.  He was restarted on his Coumadin.  His blood pressure was managed, put back on his usual medications.  Awaiting for his protime to prolong, he was placed on some Lovenox.  By September 18 he was tolerating his diet.  He was stable.  He did have a little mild hypokalemia to 3.5, not thought significant, although he was given some supplemental p.o. potassium.  His diabetes had remained stable. His abdomen was healing nicely and alternate staples were removed.  The patient was discharged in satisfactory condition.  Tylox for pain. Limited activities.  Resume his diet as tolerated.  Go back on his usual home insulin with appropriate monitoring for glucose levels.  Restart on most of his cardiac medications per cardiology.  He is to follow-up with me in a few days and then in a couple of weeks with both renal and cardiology.  PATHOLOGY:  No residual cancer indicating that the tumor had been completely removed.  LABORATORIES:  Hemoglobin the day prior to discharge was 13.9 with white count 10,200.  Protime was up to 18.2 with an INR of 1.7 the day prior to discharge and was continued on his anticoagulation. Dictated by:   [name], M.D. Attending Physician:  Oletha Cruel DD:  09/25/00 TD:  09/25/00 Job: [id number]

## 2010-05-26 NOTE — Op Note (Signed)
NAME:  [name] [name], [name] [name] NO.:  [id number]   MEDICAL RECORD NO.:  [id number]          PATIENT TYPE:  AMB   LOCATION:  ENDO                         FACILITY:  [facility]   PHYSICIAN:  Waverly Ferrari, M.D.    DATE OF BIRTH:  Feb 04, 1940   DATE OF PROCEDURE:  06/14/2004  DATE OF DISCHARGE:                                 OPERATIVE REPORT   PROCEDURE:  Colonoscopy.   INDICATIONS:  Colon polyp with malignancy resected 2 years ago by Dr.  Margot Chimes.   ANESTHESIA:  Demerol 20, Versed 1 milligram.   PROCEDURE:  With the patient mildly sedated in the left lateral decubitus  position, the rectal exam was performed. Prostate felt normal and otherwise  unremarkable on rectal examination. Subsequently the Olympus videoscopic  colonoscope was inserted into the rectum, passed under direct vision to the  cecum, identified by ileocecal valve and appendiceal orifice both of which  were photographed. From this point the colonoscope was slowly withdrawn  taking circumferential views of colonic mucosa, stopping the rectum which  appeared normal on direct and retroflexed view. The endoscope was  straightened, withdrawn. The patient's vital signs, pulse oximeter remained  stable. The patient tolerated procedure well without apparent complications.   FINDINGS:  Unremarkable examination to the cecum.   PLAN:  Consider repeat examination possibly in 5 years.      GMO/MEDQ  D:  06/14/2004  T:  06/14/2004  Job:  [id number]   cc:   Luna Kitchens. Redmond Pulling, M.D.  [address]  [place]  [place] [address]  Fax: [phone]   [place] Rollene Fare, M.D.  [phone] [address], [address]  [place]  [place] [address]  Fax: [phone]   [facility]   [name], M.D.  [address], [place]  [place] [address]

## 2010-05-26 NOTE — Consult Note (Signed)
[place]  Patient:    [name] [name], [name] [name]                         MRN: [id number] Proc. Date: 01/31/00 Adm. Date:  01/30/00 Attending:  Windy Kalata, M.D. CC:         Richard A. Rollene Fare, M.D.                          Consultation Report  REASON FOR CONSULTATION:  Chronic renal insufficiency.  HISTORY OF PRESENT ILLNESS:  This 70 year old white male with a history of chronic renal insufficiency secondary to diabetes and hypertension, who was followed by Dr. [name] in our office.  He was admitted yesterday because of unstable angina and the need for heart catheterization.  His serum creatinine in May 2001 was 2.2, on December 2001 2.0, and currently is 1.8.  Overall, he has been doing reasonably well from the renal standpoint over the last year with no new issues.  He has been maintained on an ACE inhibitor and Lasix, both of which are hold now because of the anticipation of heart catheterization.  PAST MEDICAL HISTORY:  1. Significant for CABG in 1992; chronic renal insufficiency secondary to     diabetes and hypertension.  2. Hypertension.  3. Diabetes.  4. History of CVA.  5. Hyperlipidemia.  ALLERGIES:  None.  MEDICATIONS:  1. NPH insulin 60 units in the morning, 70 units in the evening.  2. Aspirin 1 a day.  3. Plavix 75 mg a day.  4. K-Dur 20 mEq a day.  5. Norvasc 2.5 mg a day, though this was increased to 5 mg a day this     hospitalization.  6. Imdur 120 mg a day.  7. Synthroid 150 mcg a day.  8. Niacin 2000 mg q.h.s.  9. Hytrin 5 mg b.i.d. 10. Lasix 40 mg b.i.d. (on hold) 11. Enalapril 10 mg b.i.d. (on hold).  SOCIAL HISTORY:  Nonsmoker, nondrinker.  He is married and lives in [place].  He used to work for [employer] but is currently disabled because of his coronary artery disease.  FAMILY HISTORY:  Negative for renal disease.  Both mother and father had coronary artery disease.  Father had diabetes.  He  has one brother with no cardiac history.  REVIEW OF SYSTEMS:  He wears glasses.  Appetite has been good.  Energy level has been decreased because of the angina.  He denies any shortness of breath, PND, or orthopnea.  He has angina-type chest pains on a daily basis, which have been relieved with multiple sublingual nitroglycerins.  He has occasional diarrhea related to his diabetes.  He has neuropathic symptoms in his hands. No recent symptoms of TIA or claudication.  PHYSICAL EXAMINATION:  VITAL SIGNS:  Blood pressure 132/54, pulse 68, temperature 96.7  GENERAL:  This 70 year old white male who looks older than his stated age.  HEENT:  Sclerae nonicteric.  Extraocular movements are intact.  NECK:  No JVD.  LUNGS:  Clear to auscultation.  HEART:  Regular with a 3/6 systolic murmur at apex radiating to left sternal border.  ABDOMEN:  Positive bowel sounds.  Nontender, nondistended.  No hepatosplenomegaly.  No bruits.  EXTREMITIES:  No clubbing, cyanosis or edema.  NEUROLOGIC:  Some decreased pinprick sensation in the hands.  IMPRESSION:  1. Chronic renal insufficiency secondary to diabetes and hypertension, with     stable renal  function.  2. Coronary artery disease and currently with unstable angina.  3. Hypertension, currently controlled.  4. Diabetes mellitus.  5. History of cerebrovascular accident.  6. Hyperlipidemia.  RECOMMENDATIONS:  1. IV fluids.  2. I agree with holding the Lasix and enalapril.  3. ___________ 600 mg tonight and in the morning.  4. Try and use the least amount of dye possible.  5. I did discuss with him the risks of contrast nephropathy and he is     agreeable to accept these risks.  6. We will follow up renal function on morning after the catheterization.  Thank you very much for the consult.  I will follow patient with you. DD:  01/31/00 TD:  01/31/00 Job: [id number]

## 2010-05-26 NOTE — Op Note (Signed)
[place]  Patient:    PACE, POITIER Visit Number: [id number] MRN: [id number]          Service Type: SUR Location: [place] [facility] Attending Physician:  Oletha Cruel Dictated by:   [name], M.D. Proc. Date: 09/19/00 Admit Date:  09/19/2000   CC:         Dorothyann Peng C. Redmond Pulling, M.D.  James L. Deterding, M.D.  Richard A. Rollene Fare, M.D.  Jim Desanctis, M.D.   Operative Report  PREOPERATIVE DIAGNOSIS:  Carcinoma of transverse colon.  POSTOPERATIVE DIAGNOSIS:  Carcinoma of transverse colon.  OPERATION PERFORMED:  Resection of transverse and proximal descending colon with primary anastomosis.  SURGEON:  [name], M.D.  ASSISTANT:  Orson Ape. Rise Patience, M.D.  ANESTHESIA:  General endotracheal.  INDICATIONS FOR PROCEDURE:  This patient recently had a work-up for GI blood loss and was found to have superficial carcinoma in what appeared to be just proximal to the splenic flexure.  It had been resected colonoscopically and photographed.  KUB had been taken with the colonoscope at the site to identify it.  A couple of metal clips were also left.  DESCRIPTION OF PROCEDURE:  The patient was brought to the operating room having been seen in the holding area.  He had no further questions and was prepared to proceed to surgery.  After satisfactory general endotracheal anesthesia had been obtained, the Foley catheter was placed and the abdomen prepped and draped as a sterile field.  A midline incision was made from just below the xiphoid to just below the umbilicus.  Abdominal exploration revealed no gross abnormalities.  Liver was normal, gallbladder was normal.  No masses were palpable within the colon. The small bowel appeared to be normal.  The kidneys were normal to palpation.  I started by removing some of the omentum starting at the midtransverse colon and working towards the splenic flexure.  This freed up a  little bit of the colon, so we could get it down some and I then started mobilizing the distal descending colon working back proximally towards the splenic flexure using the cautery until we had the splenic flexure completely mobilized and up.  I used metal clips for hemostasis around the splenic flexure, but this was done without any significant bleeding.  Further palpation of the area revealed no clips.  No areas felt abnormal any place in the colon.  We used a C-arm to try to identify a couple of clips and could find no clips in the abdomen with specific attention to the colon, tried to use [place] to isolate different areas of the colon and run the C-arm across it.  Having done this, I thought perhaps the clips had fallen off at some point.  We then elected to proceed with resection choosing an area just about the level of the midcolics and then going around to middescending colon.  The intervening mesentery was divided between clamps and ligated with 2-0 silk.  On either end an Zumbrota and then a Fransisca Kaufmann was placed and the bowel divided.  I opened the bowel off the table and could find no evidence of any polypoid lesions.  Everything appeared to have healed.  I then opened the proximal end of the bowel and looked in it and used a sterile proctoscope and right at the edge just where we divided the bowel was an area that might have represented an area where a polyp had been removed.  I therefore divided  some more mesentery and took another two inches to get beyond this site.  No other lesions could be seen in either end.  I then performed an anastomosis.  Back walls were tacked together with some 3-0 silks and the stapler used to perform the anastomosis using the triangulation technique.  The back wall was done and then two more applications to complete the front wall, producing a nice closure with no tension.  Gloves and instruments were then changed.  The defect in the mesentery was  closed with some 3-0 silks.  The abdomen was checked for hemostasis.  All self-retaining retractors were removed.  We tried to get a specimen no x-ray.  I couldnt see any clips on that.  I used the C-arm again to look for clips and to see that we had not left any behind and could not find any clips other than the two that we had placed in the left upper quadrant for hemostasis but certainly no small clips that might have been placed by the colonoscope.  I felt that we had gotten the area out at least by the anatomical position of the colonoscope and at this point closed.  The abdomen was closed with running #1 Novofil.  The wound was irrigated and the skin closed with staples. The patient tolerated the procedure well.  There were no operative complications.  All counts were correct. Dictated by:   [name], M.D. Attending Physician:  Oletha Cruel DD:  09/19/00 TD:  09/19/00 Job: [id number]

## 2010-06-15 ENCOUNTER — Encounter: Payer: Self-pay | Admitting: Family Medicine

## 2010-06-15 ENCOUNTER — Ambulatory Visit (INDEPENDENT_AMBULATORY_CARE_PROVIDER_SITE_OTHER): Payer: Medicare Other | Admitting: Family Medicine

## 2010-06-15 VITALS — BP 130/60 | Temp 98.5°F | Ht 66.0 in | Wt 186.2 lb

## 2010-06-15 DIAGNOSIS — I10 Essential (primary) hypertension: Secondary | ICD-10-CM

## 2010-06-15 DIAGNOSIS — E119 Type 2 diabetes mellitus without complications: Secondary | ICD-10-CM

## 2010-06-15 DIAGNOSIS — E039 Hypothyroidism, unspecified: Secondary | ICD-10-CM

## 2010-06-15 DIAGNOSIS — Z0000 Encounter for general adult medical examination without abnormal findings: Secondary | ICD-10-CM | POA: Insufficient documentation

## 2010-06-15 DIAGNOSIS — E785 Hyperlipidemia, unspecified: Secondary | ICD-10-CM

## 2010-06-15 LAB — LIPID PANEL
Cholesterol: 104 mg/dL (ref 0–200)
HDL: 23.8 mg/dL — ABNORMAL LOW (ref >39.00)
Triglycerides: 98 mg/dL (ref 0.0–149.0)

## 2010-06-15 LAB — HEPATIC FUNCTION PANEL: Albumin: 4.6 g/dL (ref 3.5–5.2)

## 2010-06-15 LAB — BASIC METABOLIC PANEL
BUN: 58 mg/dL — ABNORMAL HIGH (ref 6–23)
Calcium: 9.6 mg/dL (ref 8.4–10.5)
Creatinine, Ser: 2.6 mg/dL — ABNORMAL HIGH (ref 0.4–1.5)
GFR: 25.79 mL/min — ABNORMAL LOW (ref >60.00)
Glucose, Bld: 132 mg/dL — ABNORMAL HIGH (ref 70–99)
Sodium: 142 mEq/L (ref 135–145)

## 2010-06-15 LAB — CBC WITH DIFFERENTIAL/PLATELET
Basophils Relative: 0.2 % (ref 0.0–3.0)
Eosinophils Relative: 4.4 % (ref 0.0–5.0)
HCT: 33 % — ABNORMAL LOW (ref 39.0–52.0)
Lymphs Abs: 1 10*3/uL (ref 0.7–4.0)
MCV: 89.4 fl (ref 78.0–100.0)
Monocytes Absolute: 0.7 10*3/uL (ref 0.1–1.0)
Platelets: 260 10*3/uL (ref 150.0–400.0)
WBC: 9.1 10*3/uL (ref 4.5–10.5)

## 2010-06-15 LAB — TSH: TSH: 0.77 u[IU]/mL (ref 0.35–5.50)

## 2010-06-15 NOTE — Assessment & Plan Note (Signed)
Chronic problem for pt.  He adjusts meds based on diet b/c due to renal failure he doesn't not always have appetite to eat regularly.  UTD on eye exam, foot exam WNL.  Check labs.  Adjust meds prn.

## 2010-06-15 NOTE — Progress Notes (Signed)
  Subjective:    Patient ID: [name] [name], male    DOB: 09/25/1940, 70 y.o.   MRN: [id number]  HPI Here today for CPE.  Risk Factors: DM- chronic problem for pt, on insulin and titrating this at home based on sugars.  UTD on eye exam. HTN- chronic problem for pt, well controlled today.  On enalapril, coreg, norvasc, lasix.  Reports 'heart is skipping a beat'.  Just had ECHO and stress test w/ Dr Rollene Fare.  Denies dizziness, chest pain, SOB.  Occuring daily. Hyperlipidemia- chronic problem for pt, on Vytorin, tolerating w/out difficulty Physical Activity: walking, doing daily activities Depression: denies sxs Hearing: pt denies concerns, normal to conversational tones, somewhat decreased to whispered voice ADL's: independent Cognitive: normal thought process, memory intact, no deficits noted Home Safety: safe at home, lives w/ wife Height, Weight, BMI, Visual Acuity: see vitals, vision corrected to 20/20 w/ glasses Counseling: discussed living will, POA, health maintenance- UTD on colonoscopy. Labs Ordered: See A&P Care Plan: See A&P    Review of Systems Patient reports no vision/hearing changes, anorexia, fever ,adenopathy, persistant/recurrent hoarseness, swallowing issues, chest pain, persistant/recurrent cough, hemoptysis, dyspnea (rest,exertional, paroxysmal nocturnal), gastrointestinal  bleeding (melena, rectal bleeding), abdominal pain, excessive heart burn, GU symptoms (dysuria, hematuria, voiding/incontinence issues) syncope, focal weakness, memory loss, skin/hair/nail changes, depression, anxiety, abnormal bruising/bleeding, musculoskeletal symptoms/signs.   + edema, neuropathy of hands, palpitations,    Objective:   Physical Exam BP 130/60  Temp(Src) 98.5 F (36.9 C) (Oral)  Ht 5\' 6"  (1.676 m)  Wt 186 lb 4 oz (84.482 kg)  BMI 30.06 kg/m2  General Appearance:    Alert, cooperative, no distress, appears stated age  Head:    Normocephalic, without obvious abnormality,  atraumatic  Eyes:    PERRL, conjunctiva/corneas clear, EOM's intact, fundoscopic exam deferred to ophtho   Ears:    Normal TM's and external ear canals, both ears  Nose:   Nares normal, septum midline, mucosa normal, no drainage   or sinus tenderness  Throat:   Lips, mucosa, and tongue normal; teeth and gums normal  Neck:   Supple, symmetrical, trachea midline, no adenopathy;       thyroid:  No enlargement/tenderness/nodules  Back:     Symmetric, no curvature, ROM normal, no CVA tenderness  Lungs:     Clear to auscultation bilaterally, respirations unlabored  Chest wall:    No tenderness or deformity  Heart:    Regular rate w/ multiple pauses.  II/VI SEM  Abdomen:     Soft, non-tender, bowel sounds active all four quadrants,    no masses, no organomegaly  Genitalia:    Normal male without lesion, discharge or tenderness  Rectal:    Normal tone, normal prostate, no masses or tenderness;   guaiac negative stool  Extremities:   Extremities normal, atraumatic, no cyanosis or edema.  AV fistula w/ palpable thrill in L arm  Pulses:   2+ and symmetric all extremities  Skin:   Skin color, texture, turgor normal, no rashes or lesions  Lymph nodes:   Cervical, supraclavicular, and axillary nodes normal  Neurologic:   CNII-XII intact. Normal strength, sensation and reflexes      throughout          Assessment & Plan:

## 2010-06-15 NOTE — Assessment & Plan Note (Signed)
Chronic problem, tolerating meds w/out difficulty.  Check labs, adjust meds prn.

## 2010-06-15 NOTE — Assessment & Plan Note (Signed)
Pt's PE WNL w/ exception of symptomatic PACs (will make cards aware) and AV fistula in place.  Check labs- pt declined PSA due to possibility that medicare won't pay.  UTD on health maintenance.  Anticipatory guidance provided.

## 2010-06-15 NOTE — Assessment & Plan Note (Signed)
Well controlled today.  Asymptomatic.  Due to symptomatic PACs (he is feeling the pause)- will send notes and EKG to Dr Rollene Fare so he is aware.  Pt reports recent stress test and ECHO were 'good'.

## 2010-06-15 NOTE — Patient Instructions (Signed)
Follow up in 3 months to recheck diabetes We'll notify you of your lab results Please call Dr Rollene Fare about your heart beat Call with any questions or concerns Hang in there! Have a great summer!

## 2010-06-16 ENCOUNTER — Encounter: Payer: Self-pay | Admitting: *Deleted

## 2010-07-18 ENCOUNTER — Other Ambulatory Visit: Payer: Self-pay | Admitting: Family Medicine

## 2010-07-18 NOTE — Telephone Encounter (Signed)
Refill sent.

## 2010-09-08 ENCOUNTER — Other Ambulatory Visit: Payer: Self-pay | Admitting: Family Medicine

## 2010-09-08 MED ORDER — CALCITRIOL 0.5 MCG PO CAPS: 0.5000 ug | ORAL_CAPSULE | ORAL | Status: DC

## 2010-09-08 NOTE — Telephone Encounter (Signed)
Rx sent 

## 2010-09-15 ENCOUNTER — Ambulatory Visit (INDEPENDENT_AMBULATORY_CARE_PROVIDER_SITE_OTHER): Payer: Medicare Other | Admitting: Family Medicine

## 2010-09-15 ENCOUNTER — Encounter: Payer: Self-pay | Admitting: Family Medicine

## 2010-09-15 VITALS — BP 128/50 | Temp 97.7°F | Wt 191.0 lb

## 2010-09-15 DIAGNOSIS — E119 Type 2 diabetes mellitus without complications: Secondary | ICD-10-CM

## 2010-09-15 LAB — BASIC METABOLIC PANEL
CO2: 30 mEq/L (ref 19–32)
Chloride: 103 mEq/L (ref 96–112)
Creatinine, Ser: 3.2 mg/dL — ABNORMAL HIGH (ref 0.4–1.5)
Glucose, Bld: 151 mg/dL — ABNORMAL HIGH (ref 70–99)
Sodium: 142 mEq/L (ref 135–145)

## 2010-09-15 NOTE — Progress Notes (Signed)
  Subjective:    Patient ID: [name] [name], male    DOB: [birth year], 70 y.o.   MRN: [id number]  HPI DM- chronic problem for pt, on NPH.  'sugars might be up a little bit- we've been travelling a lot'.  Denies symptomatic lows.  Denies CP, SOB above baseline, edema.  UTD on eye exam (Dr Zigmund Daniel).     Review of Systems For ROS see HPI     Objective:   Physical Exam  Vitals reviewed. Constitutional: He is oriented to person, place, and time. He appears well-developed and well-nourished. No distress.  HENT:  Head: Normocephalic and atraumatic.  Cardiovascular: Normal rate and regular rhythm.   Murmur (III/VI SEM) heard. Pulmonary/Chest: Effort normal and breath sounds normal. No respiratory distress. He has no wheezes. He has no rales.  Musculoskeletal: He exhibits no edema.  Neurological: He is alert and oriented to person, place, and time.          Assessment & Plan:

## 2010-09-15 NOTE — Patient Instructions (Signed)
Follow up in 3 months to recheck your diabetes and cholesterol- do not eat before this appt We'll notify you of your lab results and make any changes if needed Call with any questions or concerns Hang in there!

## 2010-09-17 NOTE — Assessment & Plan Note (Signed)
Pt aware that A1C may be up from previous due to vacation eating.  Discussed importance of healthy food choices.  Check labs and adjust meds prn.

## 2010-09-18 ENCOUNTER — Telehealth: Payer: Self-pay

## 2010-09-18 NOTE — Telephone Encounter (Signed)
Called to make pt aware of lab results.  Left a message for pt to return call.

## 2010-09-18 NOTE — Progress Notes (Signed)
Quick Note:  Pt aware ______ 

## 2010-09-19 ENCOUNTER — Other Ambulatory Visit: Payer: Self-pay | Admitting: *Deleted

## 2010-09-19 MED ORDER — INSULIN NPH (HUMAN) (ISOPHANE) 100 UNIT/ML ~~LOC~~ SUSP: 20.0000 [IU] | Freq: Two times a day (BID) | SUBCUTANEOUS | Status: DC

## 2010-09-19 NOTE — Telephone Encounter (Signed)
Rx sent 

## 2010-09-27 ENCOUNTER — Other Ambulatory Visit: Payer: Self-pay | Admitting: Family Medicine

## 2010-09-27 LAB — LIPASE, BLOOD: Lipase: 36

## 2010-09-27 LAB — COMPREHENSIVE METABOLIC PANEL
BUN: 43 — ABNORMAL HIGH
CO2: 26
Chloride: 103
Creatinine, Ser: 1.42
GFR calc non Af Amer: 50 — ABNORMAL LOW
Total Bilirubin: 0.4

## 2010-09-27 LAB — URINALYSIS, ROUTINE W REFLEX MICROSCOPIC
Ketones, ur: NEGATIVE
Nitrite: NEGATIVE
Protein, ur: NEGATIVE

## 2010-09-27 LAB — CBC
HCT: 37.5 — ABNORMAL LOW
MCV: 90.6
RBC: 4.13 — ABNORMAL LOW
WBC: 9.5

## 2010-09-27 LAB — DIFFERENTIAL
Basophils Absolute: 0
Basophils Relative: 0
Eosinophils Absolute: 0.5
Lymphs Abs: 1.1
Neutrophils Relative %: 73

## 2010-09-27 NOTE — Telephone Encounter (Signed)
Rx done 03/13/10 for #90 with 3 refills

## 2010-10-02 ENCOUNTER — Other Ambulatory Visit: Payer: Self-pay

## 2010-10-02 MED ORDER — TEMAZEPAM 15 MG PO CAPS: 15.0000 mg | ORAL_CAPSULE | Freq: Every evening | ORAL | Status: DC | PRN

## 2010-10-02 NOTE — Telephone Encounter (Signed)
Rx for temazepam faxed to [facility]

## 2010-10-09 DIAGNOSIS — I2510 Atherosclerotic heart disease of native coronary artery without angina pectoris: Secondary | ICD-10-CM | POA: Insufficient documentation

## 2010-10-09 DIAGNOSIS — Z9861 Coronary angioplasty status: Secondary | ICD-10-CM | POA: Insufficient documentation

## 2010-10-12 ENCOUNTER — Emergency Department (HOSPITAL_COMMUNITY): Payer: Medicare Other

## 2010-10-12 ENCOUNTER — Inpatient Hospital Stay (HOSPITAL_COMMUNITY)
Admission: EM | Admit: 2010-10-12 | Discharge: 2010-10-18 | DRG: 248 | Disposition: A | Discharge status: Home / Self Care | Payer: Medicare Other | Attending: Internal Medicine | Admitting: Internal Medicine

## 2010-10-12 DIAGNOSIS — J189 Pneumonia, unspecified organism: Secondary | ICD-10-CM | POA: Diagnosis present

## 2010-10-12 DIAGNOSIS — I509 Heart failure, unspecified: Secondary | ICD-10-CM | POA: Diagnosis present

## 2010-10-12 DIAGNOSIS — I2582 Chronic total occlusion of coronary artery: Secondary | ICD-10-CM | POA: Diagnosis present

## 2010-10-12 DIAGNOSIS — I25810 Atherosclerosis of coronary artery bypass graft(s) without angina pectoris: Secondary | ICD-10-CM | POA: Diagnosis present

## 2010-10-12 DIAGNOSIS — I447 Left bundle-branch block, unspecified: Secondary | ICD-10-CM | POA: Diagnosis present

## 2010-10-12 DIAGNOSIS — E669 Obesity, unspecified: Secondary | ICD-10-CM | POA: Diagnosis present

## 2010-10-12 DIAGNOSIS — Z85038 Personal history of other malignant neoplasm of large intestine: Secondary | ICD-10-CM

## 2010-10-12 DIAGNOSIS — D631 Anemia in chronic kidney disease: Secondary | ICD-10-CM | POA: Diagnosis present

## 2010-10-12 DIAGNOSIS — I359 Nonrheumatic aortic valve disorder, unspecified: Secondary | ICD-10-CM | POA: Diagnosis present

## 2010-10-12 DIAGNOSIS — I2510 Atherosclerotic heart disease of native coronary artery without angina pectoris: Secondary | ICD-10-CM | POA: Diagnosis present

## 2010-10-12 DIAGNOSIS — N186 End stage renal disease: Secondary | ICD-10-CM | POA: Diagnosis present

## 2010-10-12 DIAGNOSIS — E785 Hyperlipidemia, unspecified: Secondary | ICD-10-CM | POA: Diagnosis present

## 2010-10-12 DIAGNOSIS — J9600 Acute respiratory failure, unspecified whether with hypoxia or hypercapnia: Secondary | ICD-10-CM | POA: Diagnosis present

## 2010-10-12 DIAGNOSIS — I214 Non-ST elevation (NSTEMI) myocardial infarction: Principal | ICD-10-CM | POA: Diagnosis present

## 2010-10-12 DIAGNOSIS — I120 Hypertensive chronic kidney disease with stage 5 chronic kidney disease or end stage renal disease: Secondary | ICD-10-CM | POA: Diagnosis present

## 2010-10-12 DIAGNOSIS — E119 Type 2 diabetes mellitus without complications: Secondary | ICD-10-CM | POA: Diagnosis present

## 2010-10-12 DIAGNOSIS — Z8673 Personal history of transient ischemic attack (TIA), and cerebral infarction without residual deficits: Secondary | ICD-10-CM

## 2010-10-12 DIAGNOSIS — E039 Hypothyroidism, unspecified: Secondary | ICD-10-CM | POA: Diagnosis present

## 2010-10-12 DIAGNOSIS — N039 Chronic nephritic syndrome with unspecified morphologic changes: Secondary | ICD-10-CM | POA: Diagnosis present

## 2010-10-12 DIAGNOSIS — I5021 Acute systolic (congestive) heart failure: Secondary | ICD-10-CM | POA: Diagnosis present

## 2010-10-12 LAB — LIPID PANEL
Cholesterol: 112 mg/dL (ref 0–200)
HDL: 27 mg/dL — ABNORMAL LOW (ref >39)
LDL Cholesterol: 49 mg/dL (ref 0–99)
Triglycerides: 180 mg/dL — ABNORMAL HIGH (ref <150)

## 2010-10-12 LAB — DIFFERENTIAL
Eosinophils Absolute: 0 10*3/uL (ref 0.0–0.7)
Eosinophils Relative: 0 % (ref 0–5)
Lymphocytes Relative: 4 % — ABNORMAL LOW (ref 12–46)
Lymphs Abs: 0.9 10*3/uL (ref 0.7–4.0)
Monocytes Relative: 11 % (ref 3–12)

## 2010-10-12 LAB — CBC
HCT: 31.8 % — ABNORMAL LOW (ref 39.0–52.0)
MCH: 28.5 pg (ref 26.0–34.0)
MCH: 28.5 pg (ref 26.0–34.0)
MCHC: 33.2 g/dL (ref 30.0–36.0)
MCV: 86.2 fL (ref 78.0–100.0)
MCV: 85.8 fL (ref 78.0–100.0)
Platelets: 226 10*3/uL (ref 150–400)
Platelets: 210 10*3/uL (ref 150–400)
RBC: 3.69 MIL/uL — ABNORMAL LOW (ref 4.22–5.81)
RBC: 3.51 MIL/uL — ABNORMAL LOW (ref 4.22–5.81)
RDW: 14.7 % (ref 11.5–15.5)
RDW: 14.7 % (ref 11.5–15.5)

## 2010-10-12 LAB — HEPATITIS B SURFACE ANTIBODY,QUALITATIVE: Hep B S Ab: NEGATIVE

## 2010-10-12 LAB — BASIC METABOLIC PANEL
BUN: 67 mg/dL — ABNORMAL HIGH (ref 6–23)
Chloride: 100 mEq/L (ref 96–112)
GFR calc Af Amer: 17 mL/min — ABNORMAL LOW (ref >90)
GFR calc non Af Amer: 15 mL/min — ABNORMAL LOW (ref >90)
Potassium: 4.7 mEq/L (ref 3.5–5.1)
Sodium: 137 mEq/L (ref 135–145)

## 2010-10-12 LAB — TROPONIN I: Troponin I: 0.33 ng/mL (ref <0.30)

## 2010-10-12 LAB — MRSA PCR SCREENING: MRSA by PCR: NEGATIVE

## 2010-10-12 LAB — HEPATITIS B SURFACE ANTIGEN: Hepatitis B Surface Ag: NEGATIVE

## 2010-10-12 LAB — CK TOTAL AND CKMB (NOT AT ARMC): Relative Index: 4.6 — ABNORMAL HIGH (ref 0.0–2.5)

## 2010-10-12 LAB — CARDIAC PANEL(CRET KIN+CKTOT+MB+TROPI)
Relative Index: 6.1 — ABNORMAL HIGH (ref 0.0–2.5)
Troponin I: 0.57 ng/mL (ref <0.30)

## 2010-10-12 LAB — PROTIME-INR: Prothrombin Time: 14.7 seconds (ref 11.6–15.2)

## 2010-10-12 LAB — ALT: ALT: 7 U/L (ref 0–53)

## 2010-10-12 LAB — MAGNESIUM: Magnesium: 2.3 mg/dL (ref 1.5–2.5)

## 2010-10-12 LAB — GLUCOSE, CAPILLARY
Glucose-Capillary: 205 mg/dL — ABNORMAL HIGH (ref 70–99)
Glucose-Capillary: 250 mg/dL — ABNORMAL HIGH (ref 70–99)

## 2010-10-12 LAB — TSH: TSH: 0.982 u[IU]/mL (ref 0.350–4.500)

## 2010-10-12 LAB — HEPARIN LEVEL (UNFRACTIONATED): Heparin Unfractionated: 0.16 IU/mL — ABNORMAL LOW (ref 0.30–0.70)

## 2010-10-13 ENCOUNTER — Inpatient Hospital Stay (HOSPITAL_COMMUNITY): Payer: Medicare Other

## 2010-10-13 LAB — CBC
HCT: 25.9 % — ABNORMAL LOW (ref 39.0–52.0)
Hemoglobin: 8.5 g/dL — ABNORMAL LOW (ref 13.0–17.0)
MCH: 28.1 pg (ref 26.0–34.0)
MCHC: 32.8 g/dL (ref 30.0–36.0)

## 2010-10-13 LAB — GLUCOSE, CAPILLARY

## 2010-10-13 LAB — BASIC METABOLIC PANEL
CO2: 28 mEq/L (ref 19–32)
Calcium: 9 mg/dL (ref 8.4–10.5)
Creatinine, Ser: 3.89 mg/dL — ABNORMAL HIGH (ref 0.50–1.35)
Glucose, Bld: 118 mg/dL — ABNORMAL HIGH (ref 70–99)

## 2010-10-13 LAB — IRON AND TIBC
Iron: 15 ug/dL — ABNORMAL LOW (ref 42–135)
UIBC: 198 ug/dL (ref 125–400)

## 2010-10-13 LAB — FERRITIN: Ferritin: 116 ng/mL (ref 22–322)

## 2010-10-14 ENCOUNTER — Inpatient Hospital Stay (HOSPITAL_COMMUNITY): Payer: Medicare Other

## 2010-10-14 LAB — CBC
HCT: 27.5 % — ABNORMAL LOW (ref 39.0–52.0)
MCH: 28.4 pg (ref 26.0–34.0)
MCHC: 32.7 g/dL (ref 30.0–36.0)
RDW: 14.7 % (ref 11.5–15.5)

## 2010-10-14 LAB — RENAL FUNCTION PANEL
BUN: 48 mg/dL — ABNORMAL HIGH (ref 6–23)
CO2: 27 mEq/L (ref 19–32)
Calcium: 9.1 mg/dL (ref 8.4–10.5)
Calcium: 9 mg/dL (ref 8.4–10.5)
Creatinine, Ser: 3.66 mg/dL — ABNORMAL HIGH (ref 0.50–1.35)
Creatinine, Ser: 3.91 mg/dL — ABNORMAL HIGH (ref 0.50–1.35)
GFR calc Af Amer: 18 mL/min — ABNORMAL LOW (ref >90)
GFR calc Af Amer: 17 mL/min — ABNORMAL LOW (ref >90)
GFR calc non Af Amer: 14 mL/min — ABNORMAL LOW (ref >90)
Glucose, Bld: 123 mg/dL — ABNORMAL HIGH (ref 70–99)
Phosphorus: 3.4 mg/dL (ref 2.3–4.6)
Phosphorus: 3.1 mg/dL (ref 2.3–4.6)
Sodium: 134 mEq/L — ABNORMAL LOW (ref 135–145)
Sodium: 135 mEq/L (ref 135–145)

## 2010-10-14 LAB — GLUCOSE, CAPILLARY

## 2010-10-14 LAB — OCCULT BLOOD X 1 CARD TO LAB, STOOL: Fecal Occult Bld: NEGATIVE

## 2010-10-15 ENCOUNTER — Inpatient Hospital Stay (HOSPITAL_COMMUNITY): Payer: Medicare Other

## 2010-10-15 LAB — BASIC METABOLIC PANEL
BUN: 38 mg/dL — ABNORMAL HIGH (ref 6–23)
Chloride: 99 mEq/L (ref 96–112)
Creatinine, Ser: 3.05 mg/dL — ABNORMAL HIGH (ref 0.50–1.35)
Glucose, Bld: 101 mg/dL — ABNORMAL HIGH (ref 70–99)
Potassium: 4.1 mEq/L (ref 3.5–5.1)

## 2010-10-15 LAB — GLUCOSE, CAPILLARY
Glucose-Capillary: 195 mg/dL — ABNORMAL HIGH (ref 70–99)
Glucose-Capillary: 134 mg/dL — ABNORMAL HIGH (ref 70–99)
Glucose-Capillary: 179 mg/dL — ABNORMAL HIGH (ref 70–99)

## 2010-10-15 LAB — HEPARIN LEVEL (UNFRACTIONATED): Heparin Unfractionated: <0.1 IU/mL — ABNORMAL LOW (ref 0.30–0.70)

## 2010-10-15 LAB — CBC
HCT: 25.5 % — ABNORMAL LOW (ref 39.0–52.0)
Hemoglobin: 8.4 g/dL — ABNORMAL LOW (ref 13.0–17.0)
MCV: 85.9 fL (ref 78.0–100.0)
RDW: 14.6 % (ref 11.5–15.5)
WBC: 9.8 10*3/uL (ref 4.0–10.5)

## 2010-10-16 ENCOUNTER — Inpatient Hospital Stay (HOSPITAL_COMMUNITY): Payer: Medicare Other

## 2010-10-16 HISTORY — PX: CARDIAC CATHETERIZATION: SHX172

## 2010-10-16 LAB — CBC
HCT: 27.4 % — ABNORMAL LOW (ref 39.0–52.0)
Hemoglobin: 9.1 g/dL — ABNORMAL LOW (ref 13.0–17.0)
MCV: 85.1 fL (ref 78.0–100.0)
RBC: 3.22 MIL/uL — ABNORMAL LOW (ref 4.22–5.81)
RDW: 14.4 % (ref 11.5–15.5)
WBC: 11 10*3/uL — ABNORMAL HIGH (ref 4.0–10.5)

## 2010-10-16 LAB — RENAL FUNCTION PANEL
Albumin: 3.4 g/dL — ABNORMAL LOW (ref 3.5–5.2)
BUN: 40 mg/dL — ABNORMAL HIGH (ref 6–23)
BUN: 37 mg/dL — ABNORMAL HIGH (ref 6–23)
CO2: 26 mEq/L (ref 19–32)
Chloride: 100 mEq/L (ref 96–112)
Creatinine, Ser: 2.78 mg/dL — ABNORMAL HIGH (ref 0.50–1.35)
Glucose, Bld: 115 mg/dL — ABNORMAL HIGH (ref 70–99)
Glucose, Bld: 142 mg/dL — ABNORMAL HIGH (ref 70–99)
Phosphorus: 3.8 mg/dL (ref 2.3–4.6)
Potassium: 3.9 mEq/L (ref 3.5–5.1)
Potassium: 4.1 mEq/L (ref 3.5–5.1)

## 2010-10-16 LAB — GLUCOSE, CAPILLARY: Glucose-Capillary: 153 mg/dL — ABNORMAL HIGH (ref 70–99)

## 2010-10-16 LAB — POCT ACTIVATED CLOTTING TIME: Activated Clotting Time: 138 seconds

## 2010-10-17 ENCOUNTER — Inpatient Hospital Stay (HOSPITAL_COMMUNITY): Payer: Medicare Other

## 2010-10-17 LAB — CBC
HCT: 28.7 % — ABNORMAL LOW (ref 39.0–52.0)
HCT: 23.5 — ABNORMAL LOW
HCT: 27 — ABNORMAL LOW
HCT: 27.3 — ABNORMAL LOW
HCT: 27.5 — ABNORMAL LOW
HCT: 30.1 — ABNORMAL LOW
HCT: 30.9 — ABNORMAL LOW
HCT: 31.2 — ABNORMAL LOW
HCT: 47.5
Hemoglobin: 9.6 g/dL — ABNORMAL LOW (ref 13.0–17.0)
Hemoglobin: 10.4 — ABNORMAL LOW
Hemoglobin: 7.7 — CL
Hemoglobin: 8.8 — ABNORMAL LOW
Hemoglobin: 9 — ABNORMAL LOW
Hemoglobin: 9.6 — ABNORMAL LOW
MCH: 28.5 pg (ref 26.0–34.0)
MCHC: 32.6
MCHC: 32.9
MCHC: 33
MCHC: 33.1
MCHC: 33.2
MCHC: 33.3
MCHC: 33.3
MCHC: 33.9
MCV: 85.2 fL (ref 78.0–100.0)
MCV: 88.6
MCV: 89.1
MCV: 89.3
MCV: 89.4
MCV: 89.6
MCV: 90.1
MCV: 90.7
Platelets: 215 10*3/uL (ref 150–400)
Platelets: 237
Platelets: 240
Platelets: 245
Platelets: 249
Platelets: 272
Platelets: 296
Platelets: 321
RBC: 3.37 MIL/uL — ABNORMAL LOW (ref 4.22–5.81)
RBC: 2.86 — ABNORMAL LOW
RBC: 3 — ABNORMAL LOW
RBC: 3.03 — ABNORMAL LOW
RBC: 3.4 — ABNORMAL LOW
RBC: 3.5 — ABNORMAL LOW
RBC: 3.51 — ABNORMAL LOW
RBC: 5.27
RDW: 14.8
RDW: 15.5 — ABNORMAL HIGH
RDW: 16.1 — ABNORMAL HIGH
RDW: 16.3 — ABNORMAL HIGH
RDW: 16.4 — ABNORMAL HIGH
RDW: 16.8 — ABNORMAL HIGH
WBC: 11.1 10*3/uL — ABNORMAL HIGH (ref 4.0–10.5)
WBC: 11.6 — ABNORMAL HIGH
WBC: 13.4 — ABNORMAL HIGH
WBC: 13.6 — ABNORMAL HIGH
WBC: 13.7 — ABNORMAL HIGH
WBC: 14 — ABNORMAL HIGH
WBC: 23 — ABNORMAL HIGH
WBC: 35.3 — ABNORMAL HIGH
WBC: 36.3 — ABNORMAL HIGH

## 2010-10-17 LAB — RENAL FUNCTION PANEL
BUN: 85 — ABNORMAL HIGH
CO2: 28 mEq/L (ref 19–32)
CO2: 22
Calcium: 9.4 mg/dL (ref 8.4–10.5)
Chloride: 100 mEq/L (ref 96–112)
Chloride: 106
Chloride: 113 — ABNORMAL HIGH
Creatinine, Ser: 2.25 mg/dL — ABNORMAL HIGH (ref 0.50–1.35)
GFR calc non Af Amer: 28 mL/min — ABNORMAL LOW (ref >90)
Glucose, Bld: 123 mg/dL — ABNORMAL HIGH (ref 70–99)
Glucose, Bld: 110 — ABNORMAL HIGH
Glucose, Bld: 122 — ABNORMAL HIGH
Phosphorus: 3.3
Potassium: 3.4 — ABNORMAL LOW
Potassium: 3.5
Sodium: 140

## 2010-10-17 LAB — DIFFERENTIAL
Basophils Absolute: 0
Basophils Absolute: 0.1
Basophils Absolute: 0.1
Basophils Relative: 0
Basophils Relative: 0
Basophils Relative: 0
Eosinophils Absolute: 0
Eosinophils Absolute: 0.1
Eosinophils Relative: 0
Eosinophils Relative: 1
Eosinophils Relative: 1
Lymphocytes Relative: 8 — ABNORMAL LOW
Lymphs Abs: 0.7
Monocytes Absolute: 0.7
Monocytes Absolute: 0.8
Monocytes Absolute: 1.8 — ABNORMAL HIGH
Monocytes Relative: 6
Neutro Abs: 10.7 — ABNORMAL HIGH
Neutrophils Relative %: 80 — ABNORMAL HIGH
Neutrophils Relative %: 94 — ABNORMAL HIGH
Smear Review: ADEQUATE

## 2010-10-17 LAB — COMPREHENSIVE METABOLIC PANEL
ALT: 26
ALT: 47
AST: 22
AST: 94 — ABNORMAL HIGH
Albumin: 1.7 — ABNORMAL LOW
Albumin: 1.8 — ABNORMAL LOW
Albumin: 2.2 — ABNORMAL LOW
Albumin: 2.5 — ABNORMAL LOW
Alkaline Phosphatase: 106
Alkaline Phosphatase: 157 — ABNORMAL HIGH
Alkaline Phosphatase: 74
BUN: 54 — ABNORMAL HIGH
BUN: 65 — ABNORMAL HIGH
BUN: 71 — ABNORMAL HIGH
CO2: 19
CO2: 21
CO2: 23
Calcium: 7.4 — ABNORMAL LOW
Calcium: 8 — ABNORMAL LOW
Chloride: 108
Chloride: 109
Chloride: 113 — ABNORMAL HIGH
Chloride: 117 — ABNORMAL HIGH
Creatinine, Ser: 1.68 — ABNORMAL HIGH
Creatinine, Ser: 1.78 — ABNORMAL HIGH
Creatinine, Ser: 2.39 — ABNORMAL HIGH
GFR calc Af Amer: 32 — ABNORMAL LOW
GFR calc Af Amer: 33 — ABNORMAL LOW
GFR calc Af Amer: >60
GFR calc non Af Amer: 26 — ABNORMAL LOW
GFR calc non Af Amer: 27 — ABNORMAL LOW
GFR calc non Af Amer: 39 — ABNORMAL LOW
GFR calc non Af Amer: 53 — ABNORMAL LOW
Glucose, Bld: 117 — ABNORMAL HIGH
Glucose, Bld: 147 — ABNORMAL HIGH
Glucose, Bld: 199 — ABNORMAL HIGH
Potassium: 3.7
Potassium: 3.9
Sodium: 138
Sodium: 142
Total Bilirubin: 0.8
Total Bilirubin: 0.8
Total Bilirubin: 0.8
Total Bilirubin: 1.2
Total Protein: 5.2 — ABNORMAL LOW
Total Protein: 5.5 — ABNORMAL LOW

## 2010-10-17 LAB — BASIC METABOLIC PANEL
CO2: 21
CO2: 22
CO2: 23
CO2: 27
Calcium: 7.7 — ABNORMAL LOW
Chloride: 110
Chloride: 110
Chloride: 112
Creatinine, Ser: 1.34
Creatinine, Ser: 1.76 — ABNORMAL HIGH
GFR calc Af Amer: 47 — ABNORMAL LOW
GFR calc Af Amer: >60
GFR calc non Af Amer: 54 — ABNORMAL LOW
Glucose, Bld: 148 — ABNORMAL HIGH
Glucose, Bld: 199 — ABNORMAL HIGH
Potassium: 3.7
Potassium: 4.8
Sodium: 139
Sodium: 140
Sodium: 143

## 2010-10-17 LAB — WOUND CULTURE: Gram Stain: NONE SEEN

## 2010-10-17 LAB — POCT I-STAT 3, ART BLOOD GAS (G3+)
Acid-base deficit: 5 — ABNORMAL HIGH
Bicarbonate: 20.2
O2 Saturation: 99
Operator id: 244851
Patient temperature: 98.6
TCO2: 19
TCO2: 21
pCO2 arterial: 19.9 — CL
pCO2 arterial: 35.1
pCO2 arterial: 59.1
pH, Arterial: 7.171 — CL
pH, Arterial: 7.365
pO2, Arterial: 100
pO2, Arterial: 63 — ABNORMAL LOW

## 2010-10-17 LAB — GLUCOSE, CAPILLARY
Glucose-Capillary: 118 mg/dL — ABNORMAL HIGH (ref 70–99)
Glucose-Capillary: 122 mg/dL — ABNORMAL HIGH (ref 70–99)
Glucose-Capillary: 128 mg/dL — ABNORMAL HIGH (ref 70–99)
Glucose-Capillary: 141 mg/dL — ABNORMAL HIGH (ref 70–99)

## 2010-10-17 LAB — HEPATIC FUNCTION PANEL
ALT: 44
Albumin: 1.6 — ABNORMAL LOW
Alkaline Phosphatase: 157 — ABNORMAL HIGH
Indirect Bilirubin: 0.5
Total Protein: 5.5 — ABNORMAL LOW

## 2010-10-17 LAB — CULTURE, RESPIRATORY W GRAM STAIN

## 2010-10-17 LAB — CHOLESTEROL, TOTAL
Cholesterol: 52
Cholesterol: 88

## 2010-10-17 LAB — PROTIME-INR: INR: 1.7 — ABNORMAL HIGH

## 2010-10-17 LAB — CROSSMATCH

## 2010-10-17 LAB — CLOSTRIDIUM DIFFICILE EIA: C difficile Toxins A+B, EIA: NEGATIVE

## 2010-10-17 LAB — BLOOD GAS, ARTERIAL
Bicarbonate: 19.6 — ABNORMAL LOW
FIO2: 0.21
Patient temperature: 98.6
pCO2 arterial: 29.9 — ABNORMAL LOW
pH, Arterial: 7.433
pO2, Arterial: 63.5 — ABNORMAL LOW

## 2010-10-17 LAB — MAGNESIUM
Magnesium: 1.9
Magnesium: 2
Magnesium: 2.1

## 2010-10-17 LAB — LIPASE, BLOOD: Lipase: 54

## 2010-10-17 LAB — CARDIAC PANEL(CRET KIN+CKTOT+MB+TROPI): Relative Index: INVALID

## 2010-10-17 LAB — I-STAT EC8
Acid-base deficit: 10 — ABNORMAL HIGH
BUN: 78 — ABNORMAL HIGH
Bicarbonate: 17.8 — ABNORMAL LOW
HCT: 28 — ABNORMAL LOW
Hemoglobin: 9.5 — ABNORMAL LOW
Operator id: 114351
Sodium: 140

## 2010-10-17 LAB — PHOSPHORUS
Phosphorus: 2.7
Phosphorus: 2.8
Phosphorus: 3.3
Phosphorus: 4.8 — ABNORMAL HIGH

## 2010-10-17 LAB — CULTURE, BLOOD (ROUTINE X 2): Culture: NO GROWTH

## 2010-10-17 LAB — BODY FLUID CULTURE

## 2010-10-17 LAB — APTT: aPTT: 32

## 2010-10-17 LAB — PREALBUMIN: Prealbumin: 13.6 — ABNORMAL LOW

## 2010-10-17 LAB — IRON AND TIBC: TIBC: 132 — ABNORMAL LOW

## 2010-10-18 ENCOUNTER — Inpatient Hospital Stay (HOSPITAL_COMMUNITY): Payer: Medicare Other

## 2010-10-18 LAB — CBC
HCT: 25.4 — ABNORMAL LOW
HCT: 29 — ABNORMAL LOW
HCT: 29.2 — ABNORMAL LOW
HCT: 36.4 — ABNORMAL LOW
Hemoglobin: 12 — ABNORMAL LOW
Hemoglobin: 8.7 — ABNORMAL LOW
Hemoglobin: 9.8 — ABNORMAL LOW
MCHC: 32.9
MCHC: 33
MCHC: 33.5
MCHC: 33.8
MCHC: 34.3
MCV: 84.7 fL (ref 78.0–100.0)
MCV: 88.8
MCV: 89.3
Platelets: 223 10*3/uL (ref 150–400)
Platelets: 245
Platelets: 264
Platelets: 382
RBC: 3 MIL/uL — ABNORMAL LOW (ref 4.22–5.81)
RBC: 3.27 — ABNORMAL LOW
RBC: 4.01 — ABNORMAL LOW
RDW: 14.5 % (ref 11.5–15.5)
RDW: 15.5 — ABNORMAL HIGH
RDW: 15.6 — ABNORMAL HIGH
RDW: 15.8 — ABNORMAL HIGH
RDW: 15.9 — ABNORMAL HIGH
RDW: 16.3 — ABNORMAL HIGH
WBC: 10.9 10*3/uL — ABNORMAL HIGH (ref 4.0–10.5)

## 2010-10-18 LAB — COMPREHENSIVE METABOLIC PANEL
ALT: 13
AST: 16
Albumin: 2.5 — ABNORMAL LOW
Alkaline Phosphatase: 64
BUN: 134 — ABNORMAL HIGH
CO2: 24
Calcium: 8.1 — ABNORMAL LOW
Chloride: 90 — ABNORMAL LOW
Creatinine, Ser: 2.71 — ABNORMAL HIGH
GFR calc Af Amer: 29 — ABNORMAL LOW
GFR calc non Af Amer: 24 — ABNORMAL LOW
Glucose, Bld: 136 — ABNORMAL HIGH
Potassium: 3.9
Sodium: 126 — ABNORMAL LOW
Total Bilirubin: 0.6
Total Protein: 5.8 — ABNORMAL LOW

## 2010-10-18 LAB — BASIC METABOLIC PANEL
BUN: 102 — ABNORMAL HIGH
BUN: 109 — ABNORMAL HIGH
BUN: 52 — ABNORMAL HIGH
BUN: 71 — ABNORMAL HIGH
CO2: 28 mEq/L (ref 19–32)
CO2: 20
CO2: 23
CO2: 24
CO2: 24
CO2: 24
CO2: 26
CO2: 27
Calcium: 8.4
Calcium: 8.5
Calcium: 8.6
Calcium: 8.7
Calcium: 9.8
Chloride: 100 mEq/L (ref 96–112)
Chloride: 107
Chloride: 112
Chloride: 94 — ABNORMAL LOW
Chloride: 94 — ABNORMAL LOW
Creatinine, Ser: 3.47 mg/dL — ABNORMAL HIGH (ref 0.50–1.35)
Creatinine, Ser: 1.49
Creatinine, Ser: 2.03 — ABNORMAL HIGH
Creatinine, Ser: 2.07 — ABNORMAL HIGH
Creatinine, Ser: 2.57 — ABNORMAL HIGH
GFR calc Af Amer: 19 mL/min — ABNORMAL LOW (ref >90)
GFR calc Af Amer: 32 — ABNORMAL LOW
GFR calc Af Amer: 37 — ABNORMAL LOW
GFR calc Af Amer: 40 — ABNORMAL LOW
GFR calc non Af Amer: 26 — ABNORMAL LOW
GFR calc non Af Amer: 30 — ABNORMAL LOW
GFR calc non Af Amer: 32 — ABNORMAL LOW
Glucose, Bld: 135 — ABNORMAL HIGH
Glucose, Bld: 143 — ABNORMAL HIGH
Glucose, Bld: 150 — ABNORMAL HIGH
Glucose, Bld: 232 — ABNORMAL HIGH
Glucose, Bld: 252 — ABNORMAL HIGH
Glucose, Bld: 60 — ABNORMAL LOW
Potassium: 3.5 mEq/L (ref 3.5–5.1)
Potassium: 3.8
Potassium: 3.9
Potassium: 4.1
Potassium: 5.8 — ABNORMAL HIGH
Sodium: 137 mEq/L (ref 135–145)
Sodium: 133 — ABNORMAL LOW
Sodium: 135
Sodium: 137

## 2010-10-18 LAB — RENAL FUNCTION PANEL
Albumin: 2.8 — ABNORMAL LOW
BUN: 52 — ABNORMAL HIGH
GFR calc Af Amer: 39 — ABNORMAL LOW
Phosphorus: 3.2
Potassium: 5.1

## 2010-10-18 LAB — PHOSPHORUS
Phosphorus: 3.6 mg/dL (ref 2.3–4.6)
Phosphorus: 1.8 — ABNORMAL LOW

## 2010-10-18 LAB — URINALYSIS, ROUTINE W REFLEX MICROSCOPIC
Leukocytes, UA: NEGATIVE
Nitrite: NEGATIVE
Nitrite: NEGATIVE
Specific Gravity, Urine: 1.01
Specific Gravity, Urine: 1.017
Urobilinogen, UA: 0.2
pH: 5
pH: 6

## 2010-10-18 LAB — CLOSTRIDIUM DIFFICILE EIA
C difficile Toxins A+B, EIA: NEGATIVE
C difficile Toxins A+B, EIA: NEGATIVE

## 2010-10-18 LAB — TYPE AND SCREEN
ABO/RH(D): O NEG
Antibody Screen: NEGATIVE

## 2010-10-18 LAB — CULTURE, BLOOD (ROUTINE X 2)
Culture  Setup Time: 201210041722
Culture: NO GROWTH

## 2010-10-18 LAB — BLOOD GAS, ARTERIAL
Acid-base deficit: 0.8
Bicarbonate: 24.1 — ABNORMAL HIGH
O2 Content: 4
O2 Saturation: 95.7
Patient temperature: 98.6
TCO2: 25.6
pCO2 arterial: 46.1 — ABNORMAL HIGH
pH, Arterial: 7.339 — ABNORMAL LOW
pO2, Arterial: 82.8

## 2010-10-18 LAB — CROSSMATCH
ABO/RH(D): O NEG
Antibody Screen: NEGATIVE

## 2010-10-18 LAB — STOOL CULTURE

## 2010-10-18 LAB — PROTIME-INR: INR: 1

## 2010-10-18 LAB — APTT: aPTT: 26

## 2010-10-18 LAB — CK TOTAL AND CKMB (NOT AT ARMC)
CK, MB: 2.8
Relative Index: INVALID
Total CK: 30

## 2010-10-18 LAB — CK: Total CK: 33

## 2010-10-18 LAB — URINE MICROSCOPIC-ADD ON

## 2010-10-18 LAB — TROPONIN I
Troponin I: 0.12 — ABNORMAL HIGH
Troponin I: 0.26 — ABNORMAL HIGH

## 2010-10-18 LAB — HEMOGLOBIN A1C: Mean Plasma Glucose: 158

## 2010-10-18 LAB — AMMONIA: Ammonia: 16

## 2010-10-18 LAB — POTASSIUM: Potassium: 6.2 — ABNORMAL HIGH

## 2010-10-18 LAB — POCT ACTIVATED CLOTTING TIME: Activated Clotting Time: 182 seconds

## 2010-10-18 LAB — URINE CULTURE

## 2010-10-18 LAB — ALBUMIN: Albumin: 3.2 g/dL — ABNORMAL LOW (ref 3.5–5.2)

## 2010-10-18 NOTE — Cardiovascular Report (Signed)
NAME:  [name] [name], FOGLIA.:  [id number]  MEDICAL RECORD NO.:  [id number]  LOCATION:  2502                         FACILITY:  [facility]  PHYSICIAN:  Rolland Porter, MD DATE OF BIRTH:  May 12, 1940  DATE OF PROCEDURE:  10/16/2010 DATE OF DISCHARGE:                           CARDIAC CATHETERIZATION   PRIMARY CARDIOLOGIST:  Delfino Lovett A. Rollene Fare, MD  PRIMARY NEPHROLOGIST:  Joyice Faster. Deterding, MD from [facility].  PRIMARY PHYSICIAN:  Annye Asa, MD  PERFORMING PHYSICIAN:  Rolland Porter, MD  PROCEDURE PERFORMED: 1. Left heart catheterization without left ventriculography.  This is     from the 5-French right femoral artery access. 2. Native coronary angiography. 3. Saphenous vein graft angiography x2. 4. Left internal mammary artery to the LAD angiography. 5. Successful PCI with SVG to OM1 with Integrity bare metal stent 3.5     mm x 15 mm and postdilated 3.65 mm.  INDICATIONS: 1. Non-ST elevation MI. 2. Acute on chronic diastolic heart failure. 3. History of known coronary disease with redo bypass grafting and     coronary bypass grafting.  BRIEF HISTORY:  [name] [name] is a very pleasant 70 year old gentleman who is a patient of Dr. Rollene Fare with history of known coronary artery disease who had initial coronary bypass grafting in 1992 with a LIMA to LAD, SVG to diagonal, and SVG to OM branch.  At that time, he did have relatively normal right coronary artery.  He underwent a percutaneous intervention of the native circumflex by Dr. Rollene Fare in 2002 and then subsequently had restenosis of the native circumflex and in 2006 underwent cardiac catheterization for non-ST elevation MI which resulted in redo bypass surgery, coronary bypass grafting in 2 different saphenous vein grafts to the OM1 and then OM2.  He had a patent LIMA to LAD and mild to moderate disease in his RCA.  He has also got history of renal artery stenosis status post renal  artery stenting.  He also has got chronic renal insufficiency with a left upper arm fistula that has been in place maturing for a year for potential dialysis.  He presented on October 4 with progressive shortness of breath and unstable angina. He was given intravenous diuretics but failed to have significant diuresis from this.  Based on his chronic kidney disease stage III, IV, and V as well as diabetes and other vascular disease, the decision was made to go ahead and begin dialysis.  He was dialyzed a total of 3 times including this past Saturday.  He remained hemodynamically stable without any significant residual heart failure symptoms, but he still had some exertional angina over the course of the weekend.  As he has now been stabilized medically with dialysis, he is now referred for invasive evaluation of coronary graft anatomy with possible percutaneous intervention.  The risks, benefits, alternatives, and indications of procedure were explained to the patient in detail, including a detailed explanation of the increased risk of saphenous vein graft angiography and the risk of long-term monitor on dialysis as a result of his ongoing renal insufficiency and contrast load.  The plan was for him to undergo cardiac catheterization today followed by dialysis tonight.  He  has resigned himself with the possibility of being on dialysis and otherwise understands the risks involved with the procedure and agrees to proceed with the planned procedure.  Informed consent was obtained with a signed form placed on the chart.  PROCEDURE:  The patient was brought to second floor of [facility] in a fasting state.  He was prepped and draped in usual sterile fashion for femoral artery access.  After time- out was performed, the patient was sedated with intravenous Versed and fentanyl.  The right femoral head was localized using tactile fluoroscopic guidance and the right  groin was anesthetized using 1% subcutaneous lidocaine.  The right common femoral artery was then accessed using the modified Seldinger technique with placement of 5- French sheath.  After the sheath was aspirated and flushed, first a 5- [place] JL-4 followed by 5-French JR-4 catheter was advanced over wire and multiple angiographic views of the first left and right coronary system were obtained.  The JR4 catheter was then advanced across the aortic valve for measurement of left ventricle hemodynamics and pullback gradient.  Catheter was then pulled back and used to engage the saphenous vein graft to the first obtuse marginal and I was unable to accurately engage the saphenous vein graft to the second obtuse marginal and therefore was pulled back into the left subclavian artery and then advanced over a wire and then used to engage the left internal mammary artery and multiple angiographic views of the left internal mammary artery graft were obtained.  A 5-French LCB diagnostic catheter was advanced over the wire and used to engage first the saphenous vein graft to the second obtuse marginal and then to reimage the saphenous vein graft to the first obtuse marginal.  The catheter was then removed completely out of the body over wire.  At this time, I reviewed the films with colleagues and the decision was made to proceed with percutaneous intervention.  INITIAL CATH LAB FINDINGS: 1. Hemodynamics.  Left central aortic pressure 146/51 mmHg. 2. Left ventricular pressure 146/5 mmHg with EDP of 14 mmHg. 3. Left ventricular function by echocardiography reveal estimated EF     of 50-55%.  Therefore LV gram was not performed in order to     conserve contrast.  ANGIOGRAPHIC FINDINGS: 1. The native left coronary artery had very minimal residual vessels.     The circumflex is 100% occluded at the left main as is the LAD.     There is a small ramus type branch that also tapers out.  No      collaterals are noted from these vessels. 2. Native right coronary artery is a dominant coronary artery with     about a 60% eccentric stenosis in the proximal section.  It gives     off and arise to several RV marginal branches but one of the most     significant of which had an ostial 70% lesion.  There is diffuse     disease throughout the entire distal portion of the RCA, most     notably about roughly 60-70% lesion just before the crux with the     takeoff of the posterior descending artery again also diffusely     diseased.  The right posterolateral branch was noted in the     previous angiographic images from 2006 is now totally occluded     proximally. 3. Left internal mammary artery graft to the LAD is widely patent.  It     touches  down to the mid LAD and then fills down distally to a     tapering LAD.  There is roughly 60-70% lesion previously noted in     the more distal portion after another diagonal branch.  It then     retrograde fills to a proximal diagonal between the anastomosis and     the diagonal with about 80% stenosis.  It does not go down all the     way back to the proximal occlusion site.  The diagonal branch     itself is filled retrograde and has a 60% lesion at the bifurcation     point.  TIMI-3 flow all the way down to the apex. 4. Saphenous vein graft to the second obtuse marginal was widely     patent with diffuse disease.  This marginal does fill antegrade     down to the second obtuse marginal but it does go back up into the     atrioventricular groove bifurcation, fills the atrioventricular     groove circumflex.  There are collaterals from these 2 vessels     filling the right posterolateral branch system. 5. Saphenous vein graft to the first obtuse marginal supplies     relatively decent sized target vessels which bifurcates and has     diffuse disease throughout the vessel.  The graft in the very     proximal section, it almost looks like a twisted  straw appearance     of a hinge point that appears to have a 90% stenosis in this     segment.  Upon initial evaluation of the angiographic images, I did review these with colleagues and the saphenous vein graft lesion was thought to be significant and that the posterolateral branch was likely to be too small for percutaneous intervention and diffuse nature of the disease in the RCA and the patient is otherwise graft dependent on the right, was thought to be not good percutaneous coronary intervention tolerated at this time.  Therefore decision was made to proceed with percutaneous intervention on the saphenous vein graft to the first obtuse marginal. The patient's family was notified of this plan and I discussed the plan with the patient as well.  INITIAL IMPRESSION: 1. Likely culprit lesion is severe proximal roughly 90% lesion in the     saphenous vein graft to the first obtuse marginal with a twisted     appearance. 2. Otherwise patent left internal mammary artery to left anterior     descending, patent saphenous vein graft to obtuse marginal 2 and     diffuse moderate disease in the RCA with 100% occluded RPL system     of the left-to-right collaterals. 3. Severe native coronary disease with the left system being 100%     graft dependent.  PLAN:  PCI SVG to OM.  INTERVENTIONAL PROCEDURE: 1. At this time, the 5-French sheath was exchanged for a 6-French     sheath.  The patient was administered Angiomax bolus and drip based     on renal protocol.  An ACT of greater than 200 was achieved. 2. Lesion:  Proximal SVG to OM 2, focal 90% lesion, reduced to 0% with     TIMI-3 flow before and after. 3. Guide:  A 6-French LCB; initial wire into an intuition propel wire     used initially to ensure ability to cross the lesion and stating     intraluminal.     a.     A  filter wire distal protection device was then advanced      using intuition propel wire as a guide. Once this was in  position,      the intuition wire was removed and the distal protection filter      was deployed with the deployment sheath removed completely out of      the body.  This position was confirmed angiographically. 4. Predilatation:  A 2.5 mm x 12 mm Sprinter Rx balloon.     a.     10 atmospheres for 45 seconds. 5. Stent:  Integrity BMS 3.5 mm x 15 mm.     a.     12 atmospheres for 45 seconds.     b.     14 atmospheres for 60 seconds (final diameter 3.65 mm).     c.     Postdeployment angiography revealed no dissection      perforation with TIMI-3 flow distally and excellent stent      apposition.   At this time, I attempted to retrieve the filter wire with      the included retrieval catheter, however I was unable to pass      through the newly placed stent.  Therefore this was exchanged for      a "bent-tip"  retrieval catheter which was used easily to advance      through the recently placed stent and used to retrieve the filter.      The filter and the retriever catheter were removed completely out      of the body and then the final angiographic images revealed no      evidence of dissection or perforation with TIMI-3 flow distally.      At this point, the guide catheter was removed completely out of      body over a wire and the sheath aspirated and flushed.  The      patient was transferred to the holding area in stable condition.      There were no complications.  Estimated blood loss was less than      20 mL.  The patient was stable before, during, and after the      procedure.  CATH LAB STATISTICS: 1. Sedation:3 mg of Versed and 75 mcg of fentanyl. 2. Contrast total of 260 mL. 3. Angiomax bolus and drip was administered. 4. The patient was on Plavix preprocedure.  FINAL IMPRESSION: 1. Successful percutaneous coronary intervention of the proximal     saphenous vein graft to the first obtuse marginal with maximum     result, placement of a Integrity bare metal stent 2.5 mm x  15 mm,     postdilated up to 3.65 mm. 2. Severe native coronary disease. 3. Diffuse disease in the right coronary artery with 100% occluded     right posterolateral branch with left-to-right collaterals.  So the     collaterals were actually from the first obtuse marginal with the     grafts being intervened on.  PLAN: 1. Standard post catheterization care for sheath removal after     Angiomax.  We will gently hydrate.  It is planned to have     hemodialysis tonight. 2. He will ambulate in the morning and if stable, question of     possibility of long-term dialysis versus or being overnight     depending on how his kidney function returns with urine output.     Anticipate that he will likely be just discharged 2 days  from now     on attempt with best case scenario being tomorrow if 100% stable. 3. Upon discharge, he will follow up with Dr. Rollene Fare.          ______________________________ Rolland Porter, MD     DWH/MEDQ  D:  10/16/2010  T:  10/17/2010  Job:  [id number]  cc:   Delfino Lovett A. Rollene Fare, M.D. James L. Deterding, M.D. Annye Asa, M.D. [facility]  Electronically Signed by [name] MD on 10/18/2010 08:48:28 AM

## 2010-10-24 NOTE — Discharge Summary (Signed)
NAME:  [name] [name], [name] [name] NO.:  [id number]  MEDICAL RECORD NO.:  [id number]  LOCATION:  2502                         FACILITY:  [facility]  PHYSICIAN:  Brithany Whitworth A. Rollene Fare, M.D.DATE OF BIRTH:  Jan 28, 1940  DATE OF ADMISSION:  10/12/2010 DATE OF DISCHARGE:  10/18/2010                              DISCHARGE SUMMARY   DISCHARGE DIAGNOSES: 1. Respiratory failure on admission. 2. Acute systolic congestive heart failure on admission secondary to     ischemic coronary disease, hypertension, and chronic renal     insufficiency. 3. Non-ST elevation myocardial infarction on admission. 4. Known coronary disease with coronary artery bypass grafting in 1992     with redo bypass grafting in 2006, saphenous vein graft to obtuse     marginal 1 bare metal stenting this admission on October 17, 2010. 5. Chronic renal failure, now on hemodialysis, the patient previously     had an arteriovenous fistula placed in May 2011 which is now     matured. 6. Type 2 insulin-dependent diabetes. 7. Hypertension. 8. Mild aortic stenosis with an aortic valve area of 1.64 on     echocardiogram this admission. 9. Preserved left ventricular function by echocardiogram this     admission. 10.Left bundle-branch block. 11.Remote cerebrovascular accident in 1998 without residual. 12.History of colon cancer, surgery in 2002. 13.Degenerative joint disease with history of spinal stenosis, status     post surgery October 2008.  HOSPITAL COURSE:  [name] [name] is a 70 year old male with diabetes and coronary disease and chronic renal insufficiency.  He has been followed by Dr. Rollene Fare.  He has actually done pretty well from a cardiac standpoint since his redo surgery in October 2006.  He has chronic renal insufficiency and has been followed by Dr. [name].  He presented on October 12, 2010 with dyspnea and unstable angina.  Chest x-ray showed congestive heart failure.  His BNP on admission was 5266.  His  troponins were positive with a peak troponin of 1.10.  His creatinine on admission was 3.85 which was up from his usual baseline.  The patient was treated with IV diuretics.  We managed to avoid intubation.  He was put on nitrates and admitted to the CCU.  There were some question of whether he had pneumonia on admission as well.  He was treated with antibiotics. He was seen in consult by the Renal Service.  We elected to hold off on bypass grafting to see if his renal function improved.  Blood cultures were negative.  His BNP improved with diuresis.  We completed a course of Rocephin and Zithromax for presumed superimposed pneumonia.  We decided to proceed with diagnostic catheterization on October 16, 2010. See Dr. Allison Quarry note for complete details.  The LIMA to LAD was patent, there was some moderate distal LAD disease.  There was a vein graft to an OM1 that had a 90% proximal stenosis.  There was a vein graft to OM2 that is patent.  There was left-to-right collaterals to the RCA and some distal RCA disease.  The patient underwent elective PCI by Dr. Ellyn Hack with a bare metal stent to the SVG to OM1.  He tolerated this well.  He had dialysis on October 18, 2010 and we feel he can be discharged later today.  He will follow up with the Renal Service as he is on dialysis.  We will have Dr. Rollene Fare see him as an outpatient. He is going to dialysis on Tuesday, Thursday, and Saturday.  Dr. Marval Regal has suggested cutting his Lasix back to 80 mg a day at discharge and discontinuing his calcitriol.  LABORATORY DATA:  Lipid profile shows cholesterol 112, HDL 27, LDL 49, TSH 0.98, iron levels 15, TIBC 213, percent sat 7.  At discharge his sodium is 137, potassium 3.5, BUN 36, creatinine 3.47, white count 10.9, hemoglobin 8.4, hematocrit 25.4, platelets 223,000.  INR is 1.13 on admission.  EKG showed sinus rhythm, left bundle-branch block.  Chest x- ray done on the 9th shows mild central  pulmonary vascular congestion, left mid lung zone and subsegmental atelectasis.  DISPOSITION:  The patient is discharged in stable condition.  He will follow up with Dr. Rollene Fare and Dr. Deterding's office.     Erlene Quan, P.A.   ______________________________ Nanine Means Rollene Fare, M.D.    Meryl Dare  D:  10/18/2010  T:  10/18/2010  Job:  [id number]  cc:   Delfino Lovett A. Rollene Fare, M.D. James L. Deterding, M.D. Annye Asa, M.D.  Electronically Signed by Kerin Ransom P.A. on 10/20/2010 02:40:55 PM Electronically Signed by Terance Ice M.D. on 10/24/2010 12:21:42 PM

## 2010-11-05 NOTE — Consult Note (Signed)
NAME:  [name] [name], [name] [name] NO.:  [id number]  MEDICAL RECORD NO.:  [id number]  LOCATION:  2918                         FACILITY:  [facility]  PHYSICIAN:  [name] Shiley, MD          DATE OF BIRTH:  10-09-1940  DATE OF CONSULTATION:  10/12/2010 DATE OF DISCHARGE:                                [name] [name]   Nephrology is consulted by Dr. Debara [name] of [facility] for the evaluation and management of [name] [name] for volume overload in a patient with chronic kidney disease stage IV/V.  HISTORY OF PRESENT ILLNESS:  [name] [name] is a 70 year old Caucasian man with past medical history significant for chronic kidney disease stage IV likely from underlying diabetes and vascular disease.  He also has a history of coronary artery disease with what appears to be unstable angina and ischemic cardiomyopathy with congestive heart failure.  He was admitted last night/early this morning for progressively worsening shortness of breath as well as substernal chest pain, weight loss, and increased pedal edema.  Diuresis has been attempted since his admission into the hospital, however, limited returns have been noted.  He denies any obvious fevers or chills and states some cough productive of yellowish/frothy sputum.  He denies any nausea, vomiting, or diarrhea, and denies any focal arthralgias.  PAST MEDICAL HISTORY: 1. Chronic kidney disease stage IV. 2. Hypertension. 3. Coronary artery disease. 4. Ischemic cardiomyopathy. 5. History of colon cancer. 6. History of BPH. 7. Metabolic bone disease. 8. Anemia of chronic kidney disease.  MEDICATIONS:  Currently while here in the hospital; 1. Furosemide 120 mg IV b.i.d. 2. Bumetanide 2 mg IV times one dose. 3. Allopurinol 300 mg daily. 4. Amlodipine 5 mg p.o. daily. 5. Aspirin 81 mg daily. 6. Azithromycin 500 mg IV daily. 7. Rocephin 1 gram IV daily. 8. Carvedilol 25 mg p.o. b.i.d. 9. Calcitriol 0.5 mcg p.o.  daily. 10.PhosLo 667 mg p.o. t.i.d. before meals. 11.Plavix 75 mg p.o. daily. 12.Enalapril 20 mg p.o. daily. 13.Heparin drip. 14.NovoLog sliding scale. 15.Synthroid 25 mcg daily. 16.Protonix 40 mg daily. 17.Ranexa 500 mg daily. 18.Sodium bicarbonate 1300 mg p.o. b.i.d. 19.Terazosin 2 mg p.o. daily.  ALLERGIES:  No known drug allergies.  SOCIAL HISTORY:  Resides at home with his wife here in [place]. Denies any tobacco, alcohol, or illicit drug abuse.  SOCIAL HISTORY:  Noncontributory.  REVIEW OF SYSTEMS:  Attempted, however, limited by the patient's shortness of breath.  PHYSICAL EXAMINATION:  VITAL SIGNS:  Temperature 98 degrees Fahrenheit, pulse of 73, blood pressure 125/44, respiratory rate of 24, oxygen saturation 94% on 2 liters via nasal cannula. GENERAL:  Obese, middle-aged Caucasian man, restless, in bed.  Appears to be somnolent, however, easily arousable. HEAD, NECK, and ENT SYSTEMS:  Head is normocephalic and atraumatic. Pupils are bilaterally equal and reactive to light.  Extraocular muscle movements are normal.  Funduscopy was not done. NECK:  Supple without any obvious goiter or lymphadenopathy.  4-cm jugular venous distention is noted. CARDIOVASCULAR:  Pulse is currently regular in rate and rhythm.  Heart sounds S1-S2 accompanied by an ejection systolic murmur. RESPIRATORY:  Fine rales bibasilarly, left side more than right. ABDOMEN:  Soft, obese, nontender without any organomegaly and bowel sounds are normal. EXTREMITIES:  1+ to 2+ edema is present over the pretibial surfaces as well as ankles. SKIN:  No peculiar rash, lumps, or bumps are noted.  LABS:  Sodium 137, potassium 4.7, bicarbonate 23, BUN 67, creatinine 3.8, glucose 195, calcium 9.8, phosphorus 3.8, magnesium 2.3, troponin-I 0.57 up from 0.33, CK-MB 8.6 up from 5.7.  Hemoglobin 10 gm/dL, hematocrit 30.1, white cell count 20,800, platelet count 210,000.  ASSESSMENT AND PLAN: 1. Chronic  kidney disease stage IV with acute respiratory failure     likely a combination of volume overload and probable pneumonia.  He     has been started on intravenous azithromycin and Rocephin for     coverage of community-acquired pneumonia.  Attempts have been     started on diuresis with intravenous furosemide, however, limited     returns are noted.  Given the gravity of his situation and the     impending respiratory failure that may lead to intubation, we will     go ahead and start him on dialysis in order to engage in some     ultrafiltration/volume unloading.  It is unclear at this juncture     as to whether this will be a prolonged need or just short-term to     get him over this hump.  No acute electrolyte concerns are noted     and no obvious uremic signs are noted. 2. Acute coronary syndrome/acute congestive heart failure     exacerbation.  Previous history of coronary artery disease     appreciated and currently noted to have an unstable angina pattern.     Plans are noted by Cardiology for possible left heart     catheterization with respiratory status improving. 3. Anemia of chronic disease.  Hemoglobin currently borderline and     without any indications for ESA.  We will check iron panel in order     to track his iron saturation/ferritin. 4. Chronic kidney disease/metabolic bone disease.  On calcitriol and     PhosLo for PTH suppression and binder therapy respectively.  We     will continue monitoring his calcium, phosphorus, and intact PTH     levels periodically.     [name] Shiley, MD     JP/MEDQ  D:  10/12/2010  T:  10/12/2010  Job:  [id number]  cc:   [name] [name], M.D. [name] [name], M.D. [name] [name], M.D.  Electronically Signed by [name] Shiley MD on 11/05/2010 10:19:06 AM

## 2010-11-20 ENCOUNTER — Other Ambulatory Visit: Payer: Self-pay | Admitting: Family Medicine

## 2010-11-20 NOTE — Telephone Encounter (Signed)
rx sent to pharmacy by e-script For vytorin and coreg

## 2010-12-15 ENCOUNTER — Ambulatory Visit (INDEPENDENT_AMBULATORY_CARE_PROVIDER_SITE_OTHER): Payer: Medicare Other | Admitting: Family Medicine

## 2010-12-15 ENCOUNTER — Encounter: Payer: Self-pay | Admitting: Family Medicine

## 2010-12-15 DIAGNOSIS — I2510 Atherosclerotic heart disease of native coronary artery without angina pectoris: Secondary | ICD-10-CM

## 2010-12-15 DIAGNOSIS — N189 Chronic kidney disease, unspecified: Secondary | ICD-10-CM

## 2010-12-15 DIAGNOSIS — E119 Type 2 diabetes mellitus without complications: Secondary | ICD-10-CM

## 2010-12-15 LAB — BASIC METABOLIC PANEL
Chloride: 98 mEq/L (ref 96–112)
Creatinine, Ser: 2.5 mg/dL — ABNORMAL HIGH (ref 0.4–1.5)
Sodium: 141 mEq/L (ref 135–145)

## 2010-12-15 LAB — HEMOGLOBIN A1C: Hgb A1c MFr Bld: 6.4 % (ref 4.6–6.5)

## 2010-12-15 MED ORDER — RENA-VITE PO TABS: 1.0000 | ORAL_TABLET | Freq: Every day | ORAL | Status: DC

## 2010-12-15 MED ORDER — NITROGLYCERIN 0.4 MG SL SUBL: 0.4000 mg | SUBLINGUAL_TABLET | SUBLINGUAL | Status: DC | PRN

## 2010-12-15 NOTE — Progress Notes (Signed)
  Subjective:    Patient ID: [name] [name], male    DOB: 1940/01/09, 70 y.o.   MRN: [id number]  HPI Renal failure- now HD dependent.  tx's on Tues/Thurs/Sat.  Kidneys stopped working after receiving dye during heart cath.  CAD- MI Oct 4th, currently denies CP, SOB, edema.  On plavix, coreg, ASA, enalapril.  DM- chronic problem, CBGs have been labile due to recent medical issues.  Is still self adjusting insulin.  CBGs are typically 130-150s.  Appetite has improved since starting HD.  Taking Novolin N 20 units BID.   Review of Systems For ROS see HPI     Objective:   Physical Exam  Constitutional: He is oriented to person, place, and time. He appears well-developed and well-nourished. No distress.  HENT:  Head: Normocephalic and atraumatic.  Eyes: Conjunctivae and EOM are normal. Pupils are equal, round, and reactive to light.  Neck: Normal range of motion. Neck supple. No thyromegaly present.  Cardiovascular: Normal rate, regular rhythm and intact distal pulses.   Murmur heard. Pulmonary/Chest: Effort normal and breath sounds normal. No respiratory distress.  Abdominal: Soft. Bowel sounds are normal. He exhibits no distension.  Musculoskeletal: He exhibits no edema.  Lymphadenopathy:    He has no cervical adenopathy.  Neurological: He is alert and oriented to person, place, and time. No cranial nerve deficit.  Skin: Skin is warm and dry.  Psychiatric: He has a normal mood and affect. His behavior is normal.          Assessment & Plan:

## 2010-12-15 NOTE — Patient Instructions (Signed)
Follow up in 3 months to recheck diabetes- sooner if needed We'll notify you of your lab results and make any changes if needed Call with any questions or concerns Hang in there!!! Happy Holidays!!!

## 2010-12-24 NOTE — Assessment & Plan Note (Signed)
Now HD dependent.  Having tx's Tues/Thurs/Sat.  Body is still adjusting to this.  Appetite is returning.  Increased lethargy on HD days.  Will continue to follow.

## 2010-12-24 NOTE — Assessment & Plan Note (Signed)
Chronic problem.  CBGs are more labile now that appetite is returning.  Check labs.  Adjust meds prn.

## 2010-12-24 NOTE — Assessment & Plan Note (Signed)
Pt has been asymptomatic since most recent MI.  Following w/ Dr Rollene Fare.  Goal is tight control of diabetes and other risk factors (BP, lipids).  Will continue to follow.

## 2011-01-11 DIAGNOSIS — D631 Anemia in chronic kidney disease: Secondary | ICD-10-CM | POA: Diagnosis not present

## 2011-01-11 DIAGNOSIS — N2581 Secondary hyperparathyroidism of renal origin: Secondary | ICD-10-CM | POA: Diagnosis not present

## 2011-01-11 DIAGNOSIS — D509 Iron deficiency anemia, unspecified: Secondary | ICD-10-CM | POA: Diagnosis not present

## 2011-01-11 DIAGNOSIS — N186 End stage renal disease: Secondary | ICD-10-CM | POA: Diagnosis not present

## 2011-01-22 ENCOUNTER — Encounter (INDEPENDENT_AMBULATORY_CARE_PROVIDER_SITE_OTHER): Payer: Medicare Other | Admitting: Ophthalmology

## 2011-01-22 DIAGNOSIS — E1165 Type 2 diabetes mellitus with hyperglycemia: Secondary | ICD-10-CM | POA: Diagnosis not present

## 2011-01-22 DIAGNOSIS — E11319 Type 2 diabetes mellitus with unspecified diabetic retinopathy without macular edema: Secondary | ICD-10-CM | POA: Diagnosis not present

## 2011-01-22 DIAGNOSIS — H43819 Vitreous degeneration, unspecified eye: Secondary | ICD-10-CM | POA: Diagnosis not present

## 2011-01-22 DIAGNOSIS — E1139 Type 2 diabetes mellitus with other diabetic ophthalmic complication: Secondary | ICD-10-CM | POA: Diagnosis not present

## 2011-01-26 DIAGNOSIS — I359 Nonrheumatic aortic valve disorder, unspecified: Secondary | ICD-10-CM | POA: Diagnosis not present

## 2011-01-26 DIAGNOSIS — I2510 Atherosclerotic heart disease of native coronary artery without angina pectoris: Secondary | ICD-10-CM | POA: Diagnosis not present

## 2011-01-26 DIAGNOSIS — I10 Essential (primary) hypertension: Secondary | ICD-10-CM | POA: Diagnosis not present

## 2011-02-01 DIAGNOSIS — E1129 Type 2 diabetes mellitus with other diabetic kidney complication: Secondary | ICD-10-CM | POA: Diagnosis not present

## 2011-02-01 DIAGNOSIS — N186 End stage renal disease: Secondary | ICD-10-CM | POA: Diagnosis not present

## 2011-02-05 ENCOUNTER — Other Ambulatory Visit: Payer: Self-pay | Admitting: Family Medicine

## 2011-02-05 MED ORDER — INSULIN NPH (HUMAN) (ISOPHANE) 100 UNIT/ML ~~LOC~~ SUSP: 20.0000 [IU] | Freq: Two times a day (BID) | SUBCUTANEOUS | Status: DC

## 2011-02-05 NOTE — Telephone Encounter (Signed)
rx sent to pharmacy by e-script  

## 2011-02-08 DIAGNOSIS — N186 End stage renal disease: Secondary | ICD-10-CM | POA: Diagnosis not present

## 2011-02-10 DIAGNOSIS — D509 Iron deficiency anemia, unspecified: Secondary | ICD-10-CM | POA: Diagnosis not present

## 2011-02-10 DIAGNOSIS — N186 End stage renal disease: Secondary | ICD-10-CM | POA: Diagnosis not present

## 2011-02-10 DIAGNOSIS — D631 Anemia in chronic kidney disease: Secondary | ICD-10-CM | POA: Diagnosis not present

## 2011-02-10 DIAGNOSIS — Z23 Encounter for immunization: Secondary | ICD-10-CM | POA: Diagnosis not present

## 2011-02-10 DIAGNOSIS — N2581 Secondary hyperparathyroidism of renal origin: Secondary | ICD-10-CM | POA: Diagnosis not present

## 2011-02-23 DIAGNOSIS — E119 Type 2 diabetes mellitus without complications: Secondary | ICD-10-CM | POA: Diagnosis not present

## 2011-02-23 DIAGNOSIS — L03039 Cellulitis of unspecified toe: Secondary | ICD-10-CM | POA: Diagnosis not present

## 2011-02-23 DIAGNOSIS — M2060 Acquired deformities of toe(s), unspecified, unspecified foot: Secondary | ICD-10-CM | POA: Diagnosis not present

## 2011-02-28 ENCOUNTER — Other Ambulatory Visit (HOSPITAL_COMMUNITY): Payer: Self-pay | Admitting: Nephrology

## 2011-02-28 DIAGNOSIS — N186 End stage renal disease: Secondary | ICD-10-CM

## 2011-03-02 ENCOUNTER — Ambulatory Visit (HOSPITAL_COMMUNITY)
Admission: RE | Admit: 2011-03-02 | Discharge: 2011-03-02 | Disposition: A | Discharge status: Home / Self Care | Payer: Medicare Other | Source: Ambulatory Visit | Attending: Nephrology | Admitting: Nephrology

## 2011-03-02 DIAGNOSIS — Z0389 Encounter for observation for other suspected diseases and conditions ruled out: Secondary | ICD-10-CM | POA: Diagnosis not present

## 2011-03-02 DIAGNOSIS — T82898A Other specified complication of vascular prosthetic devices, implants and grafts, initial encounter: Secondary | ICD-10-CM | POA: Diagnosis not present

## 2011-03-02 DIAGNOSIS — N186 End stage renal disease: Secondary | ICD-10-CM | POA: Diagnosis not present

## 2011-03-02 MED ORDER — IOHEXOL 300 MG/ML  SOLN
100.0000 mL | Freq: Once | INTRAMUSCULAR | Status: AC | PRN
  Administered 2011-03-02: 28 mL via INTRAVENOUS

## 2011-03-02 NOTE — Procedures (Signed)
Normally functioning left upper arm native AVF.  No intervention performed.

## 2011-03-05 ENCOUNTER — Telehealth (HOSPITAL_COMMUNITY): Payer: Self-pay

## 2011-03-08 DIAGNOSIS — N186 End stage renal disease: Secondary | ICD-10-CM | POA: Diagnosis not present

## 2011-03-10 DIAGNOSIS — N2581 Secondary hyperparathyroidism of renal origin: Secondary | ICD-10-CM | POA: Diagnosis not present

## 2011-03-10 DIAGNOSIS — D509 Iron deficiency anemia, unspecified: Secondary | ICD-10-CM | POA: Diagnosis not present

## 2011-03-10 DIAGNOSIS — N186 End stage renal disease: Secondary | ICD-10-CM | POA: Diagnosis not present

## 2011-03-10 DIAGNOSIS — Z992 Dependence on renal dialysis: Secondary | ICD-10-CM | POA: Diagnosis not present

## 2011-03-10 DIAGNOSIS — N039 Chronic nephritic syndrome with unspecified morphologic changes: Secondary | ICD-10-CM | POA: Diagnosis not present

## 2011-03-10 DIAGNOSIS — D631 Anemia in chronic kidney disease: Secondary | ICD-10-CM | POA: Diagnosis not present

## 2011-03-13 DIAGNOSIS — N186 End stage renal disease: Secondary | ICD-10-CM | POA: Diagnosis not present

## 2011-03-13 DIAGNOSIS — D509 Iron deficiency anemia, unspecified: Secondary | ICD-10-CM | POA: Diagnosis not present

## 2011-03-13 DIAGNOSIS — Z992 Dependence on renal dialysis: Secondary | ICD-10-CM | POA: Diagnosis not present

## 2011-03-13 DIAGNOSIS — D631 Anemia in chronic kidney disease: Secondary | ICD-10-CM | POA: Diagnosis not present

## 2011-03-13 DIAGNOSIS — I6529 Occlusion and stenosis of unspecified carotid artery: Secondary | ICD-10-CM | POA: Diagnosis not present

## 2011-03-13 DIAGNOSIS — N2581 Secondary hyperparathyroidism of renal origin: Secondary | ICD-10-CM | POA: Diagnosis not present

## 2011-03-15 DIAGNOSIS — D509 Iron deficiency anemia, unspecified: Secondary | ICD-10-CM | POA: Diagnosis not present

## 2011-03-15 DIAGNOSIS — N039 Chronic nephritic syndrome with unspecified morphologic changes: Secondary | ICD-10-CM | POA: Diagnosis not present

## 2011-03-15 DIAGNOSIS — Z992 Dependence on renal dialysis: Secondary | ICD-10-CM | POA: Diagnosis not present

## 2011-03-15 DIAGNOSIS — N186 End stage renal disease: Secondary | ICD-10-CM | POA: Diagnosis not present

## 2011-03-15 DIAGNOSIS — N2581 Secondary hyperparathyroidism of renal origin: Secondary | ICD-10-CM | POA: Diagnosis not present

## 2011-03-15 DIAGNOSIS — D631 Anemia in chronic kidney disease: Secondary | ICD-10-CM | POA: Diagnosis not present

## 2011-03-17 DIAGNOSIS — Z992 Dependence on renal dialysis: Secondary | ICD-10-CM | POA: Diagnosis not present

## 2011-03-17 DIAGNOSIS — D631 Anemia in chronic kidney disease: Secondary | ICD-10-CM | POA: Diagnosis not present

## 2011-03-17 DIAGNOSIS — N2581 Secondary hyperparathyroidism of renal origin: Secondary | ICD-10-CM | POA: Diagnosis not present

## 2011-03-17 DIAGNOSIS — N186 End stage renal disease: Secondary | ICD-10-CM | POA: Diagnosis not present

## 2011-03-17 DIAGNOSIS — D509 Iron deficiency anemia, unspecified: Secondary | ICD-10-CM | POA: Diagnosis not present

## 2011-03-19 ENCOUNTER — Ambulatory Visit (INDEPENDENT_AMBULATORY_CARE_PROVIDER_SITE_OTHER): Payer: Medicare Other | Admitting: Family Medicine

## 2011-03-19 ENCOUNTER — Encounter: Payer: Self-pay | Admitting: Family Medicine

## 2011-03-19 DIAGNOSIS — E119 Type 2 diabetes mellitus without complications: Secondary | ICD-10-CM

## 2011-03-19 DIAGNOSIS — E785 Hyperlipidemia, unspecified: Secondary | ICD-10-CM

## 2011-03-19 DIAGNOSIS — I10 Essential (primary) hypertension: Secondary | ICD-10-CM | POA: Diagnosis not present

## 2011-03-19 DIAGNOSIS — E039 Hypothyroidism, unspecified: Secondary | ICD-10-CM

## 2011-03-19 MED ORDER — ALLOPURINOL 100 MG PO TABS: 100.0000 mg | ORAL_TABLET | Freq: Every day | ORAL | Status: DC

## 2011-03-19 MED ORDER — TEMAZEPAM 15 MG PO CAPS: 15.0000 mg | ORAL_CAPSULE | Freq: Every evening | ORAL | Status: DC | PRN

## 2011-03-19 MED ORDER — AMLODIPINE BESYLATE 5 MG PO TABS: 2.5000 mg | ORAL_TABLET | Freq: Every day | ORAL | Status: DC

## 2011-03-19 MED ORDER — CARVEDILOL 25 MG PO TABS: 25.0000 mg | ORAL_TABLET | Freq: Two times a day (BID) | ORAL | Status: DC

## 2011-03-19 NOTE — Assessment & Plan Note (Signed)
Chronic problem.  Having difficult time controlling CBGs now that he is on HD.  Will get A1C at pt's next HD appt (tomorrow) and adjust meds prn.  Pt is self adjusting insulin based on oral intake.  Will follow closely.

## 2011-03-19 NOTE — Assessment & Plan Note (Signed)
Chronic problem.  On Vytorin w/out difficulty.  Check labs at upcoming HD appt.  Adjust meds prn.

## 2011-03-19 NOTE — Progress Notes (Signed)
  Subjective:    Patient ID: [name] [name], male    DOB: Jan 08, 1941, 71 y.o.   MRN: [id number]  HPI DM- chronic problem, taking 30 units bid of NPH.  Reports sugars are harder to control since starting HD.  Appetite is slowly returning.  Hyperlipidemia- chronic problem, on Vytorin 1/2 tab.  Denies abd pain, N/V, myalgias.  HTN- chronic problem.  On Norvasc 2.5mg , Coreg.  BP well controlled today.  Due for HD tomorrow.  Will have some episodes of hypotension due to HD.  Renal stopped the enalapril.   Review of Systems For ROS see HPI     Objective:   Physical Exam  Constitutional: He is oriented to person, place, and time. He appears well-developed and well-nourished. No distress.  HENT:  Head: Normocephalic and atraumatic.  Eyes: Conjunctivae and EOM are normal. Pupils are equal, round, and reactive to light.  Neck: Normal range of motion. Neck supple. No thyromegaly present.  Cardiovascular: Normal rate, regular rhythm and intact distal pulses.   Murmur (II/VI SEM) heard. Pulmonary/Chest: Effort normal and breath sounds normal. No respiratory distress.  Abdominal: Soft. Bowel sounds are normal. He exhibits no distension.  Musculoskeletal: He exhibits no edema.  Lymphadenopathy:    He has no cervical adenopathy.  Neurological: He is alert and oriented to person, place, and time. No cranial nerve deficit.  Skin: Skin is warm and dry.  Psychiatric: He has a normal mood and affect. His behavior is normal.          Assessment & Plan:

## 2011-03-19 NOTE — Assessment & Plan Note (Signed)
Chronic problem.  Well controlled.  Renal is continuing to adjust pt's meds to avoid hypotension in the setting of HD.  Will continue to follow.

## 2011-03-19 NOTE — Patient Instructions (Signed)
Follow up in 3 months We'll notify you of your lab results and make any changes if needed Call with any questions or concerns Hang in there!! Happy Early Rudene Anda!

## 2011-03-19 NOTE — Assessment & Plan Note (Signed)
Check labs.  Adjust meds prn  

## 2011-03-20 DIAGNOSIS — D509 Iron deficiency anemia, unspecified: Secondary | ICD-10-CM | POA: Diagnosis not present

## 2011-03-20 DIAGNOSIS — D631 Anemia in chronic kidney disease: Secondary | ICD-10-CM | POA: Diagnosis not present

## 2011-03-20 DIAGNOSIS — Z992 Dependence on renal dialysis: Secondary | ICD-10-CM | POA: Diagnosis not present

## 2011-03-20 DIAGNOSIS — N186 End stage renal disease: Secondary | ICD-10-CM | POA: Diagnosis not present

## 2011-03-20 DIAGNOSIS — N2581 Secondary hyperparathyroidism of renal origin: Secondary | ICD-10-CM | POA: Diagnosis not present

## 2011-03-21 ENCOUNTER — Other Ambulatory Visit: Payer: Medicare Other

## 2011-03-21 DIAGNOSIS — E039 Hypothyroidism, unspecified: Secondary | ICD-10-CM | POA: Diagnosis not present

## 2011-03-21 DIAGNOSIS — E119 Type 2 diabetes mellitus without complications: Secondary | ICD-10-CM

## 2011-03-21 DIAGNOSIS — E785 Hyperlipidemia, unspecified: Secondary | ICD-10-CM | POA: Diagnosis not present

## 2011-03-22 DIAGNOSIS — Z992 Dependence on renal dialysis: Secondary | ICD-10-CM | POA: Diagnosis not present

## 2011-03-22 DIAGNOSIS — N186 End stage renal disease: Secondary | ICD-10-CM | POA: Diagnosis not present

## 2011-03-22 DIAGNOSIS — N039 Chronic nephritic syndrome with unspecified morphologic changes: Secondary | ICD-10-CM | POA: Diagnosis not present

## 2011-03-22 DIAGNOSIS — N2581 Secondary hyperparathyroidism of renal origin: Secondary | ICD-10-CM | POA: Diagnosis not present

## 2011-03-22 DIAGNOSIS — D509 Iron deficiency anemia, unspecified: Secondary | ICD-10-CM | POA: Diagnosis not present

## 2011-03-22 LAB — HEPATIC FUNCTION PANEL
AST: 22 U/L (ref 0–37)
Alkaline Phosphatase: 78 U/L (ref 39–117)
Indirect Bilirubin: 0.2 mg/dL (ref 0.0–0.9)
Total Bilirubin: 0.4 mg/dL (ref 0.3–1.2)
Total Protein: 7.2 g/dL (ref 6.0–8.3)

## 2011-03-22 LAB — LIPID PANEL
HDL: 22 mg/dL — ABNORMAL LOW (ref >39)
LDL Cholesterol: 14 mg/dL (ref 0–99)
Total CHOL/HDL Ratio: 4 Ratio

## 2011-03-22 LAB — BASIC METABOLIC PANEL
CO2: 31 mEq/L (ref 19–32)
Calcium: 9.9 mg/dL (ref 8.4–10.5)
Potassium: 4.3 mEq/L (ref 3.5–5.3)
Sodium: 140 mEq/L (ref 135–145)

## 2011-03-22 LAB — HEMOGLOBIN A1C
Hgb A1c MFr Bld: 6 % — ABNORMAL HIGH (ref <5.7)
Mean Plasma Glucose: 126 mg/dL — ABNORMAL HIGH (ref <117)

## 2011-03-24 DIAGNOSIS — N2581 Secondary hyperparathyroidism of renal origin: Secondary | ICD-10-CM | POA: Diagnosis not present

## 2011-03-24 DIAGNOSIS — N186 End stage renal disease: Secondary | ICD-10-CM | POA: Diagnosis not present

## 2011-03-27 DIAGNOSIS — Z992 Dependence on renal dialysis: Secondary | ICD-10-CM | POA: Diagnosis not present

## 2011-03-27 DIAGNOSIS — N2581 Secondary hyperparathyroidism of renal origin: Secondary | ICD-10-CM | POA: Diagnosis not present

## 2011-03-27 DIAGNOSIS — D509 Iron deficiency anemia, unspecified: Secondary | ICD-10-CM | POA: Diagnosis not present

## 2011-03-27 DIAGNOSIS — N186 End stage renal disease: Secondary | ICD-10-CM | POA: Diagnosis not present

## 2011-03-27 DIAGNOSIS — D631 Anemia in chronic kidney disease: Secondary | ICD-10-CM | POA: Diagnosis not present

## 2011-03-29 DIAGNOSIS — D509 Iron deficiency anemia, unspecified: Secondary | ICD-10-CM | POA: Diagnosis not present

## 2011-03-29 DIAGNOSIS — Z992 Dependence on renal dialysis: Secondary | ICD-10-CM | POA: Diagnosis not present

## 2011-03-29 DIAGNOSIS — N039 Chronic nephritic syndrome with unspecified morphologic changes: Secondary | ICD-10-CM | POA: Diagnosis not present

## 2011-03-29 DIAGNOSIS — D631 Anemia in chronic kidney disease: Secondary | ICD-10-CM | POA: Diagnosis not present

## 2011-03-29 DIAGNOSIS — N2581 Secondary hyperparathyroidism of renal origin: Secondary | ICD-10-CM | POA: Diagnosis not present

## 2011-03-29 DIAGNOSIS — N186 End stage renal disease: Secondary | ICD-10-CM | POA: Diagnosis not present

## 2011-03-31 DIAGNOSIS — D509 Iron deficiency anemia, unspecified: Secondary | ICD-10-CM | POA: Diagnosis not present

## 2011-03-31 DIAGNOSIS — D631 Anemia in chronic kidney disease: Secondary | ICD-10-CM | POA: Diagnosis not present

## 2011-03-31 DIAGNOSIS — N186 End stage renal disease: Secondary | ICD-10-CM | POA: Diagnosis not present

## 2011-03-31 DIAGNOSIS — Z992 Dependence on renal dialysis: Secondary | ICD-10-CM | POA: Diagnosis not present

## 2011-03-31 DIAGNOSIS — N2581 Secondary hyperparathyroidism of renal origin: Secondary | ICD-10-CM | POA: Diagnosis not present

## 2011-04-03 DIAGNOSIS — Z992 Dependence on renal dialysis: Secondary | ICD-10-CM | POA: Diagnosis not present

## 2011-04-03 DIAGNOSIS — N186 End stage renal disease: Secondary | ICD-10-CM | POA: Diagnosis not present

## 2011-04-03 DIAGNOSIS — D631 Anemia in chronic kidney disease: Secondary | ICD-10-CM | POA: Diagnosis not present

## 2011-04-03 DIAGNOSIS — N2581 Secondary hyperparathyroidism of renal origin: Secondary | ICD-10-CM | POA: Diagnosis not present

## 2011-04-03 DIAGNOSIS — D509 Iron deficiency anemia, unspecified: Secondary | ICD-10-CM | POA: Diagnosis not present

## 2011-04-05 DIAGNOSIS — Z992 Dependence on renal dialysis: Secondary | ICD-10-CM | POA: Diagnosis not present

## 2011-04-05 DIAGNOSIS — N2581 Secondary hyperparathyroidism of renal origin: Secondary | ICD-10-CM | POA: Diagnosis not present

## 2011-04-05 DIAGNOSIS — N039 Chronic nephritic syndrome with unspecified morphologic changes: Secondary | ICD-10-CM | POA: Diagnosis not present

## 2011-04-05 DIAGNOSIS — N186 End stage renal disease: Secondary | ICD-10-CM | POA: Diagnosis not present

## 2011-04-05 DIAGNOSIS — D509 Iron deficiency anemia, unspecified: Secondary | ICD-10-CM | POA: Diagnosis not present

## 2011-04-07 DIAGNOSIS — N2581 Secondary hyperparathyroidism of renal origin: Secondary | ICD-10-CM | POA: Diagnosis not present

## 2011-04-07 DIAGNOSIS — D631 Anemia in chronic kidney disease: Secondary | ICD-10-CM | POA: Diagnosis not present

## 2011-04-07 DIAGNOSIS — Z992 Dependence on renal dialysis: Secondary | ICD-10-CM | POA: Diagnosis not present

## 2011-04-07 DIAGNOSIS — N186 End stage renal disease: Secondary | ICD-10-CM | POA: Diagnosis not present

## 2011-04-07 DIAGNOSIS — D509 Iron deficiency anemia, unspecified: Secondary | ICD-10-CM | POA: Diagnosis not present

## 2011-04-10 DIAGNOSIS — N186 End stage renal disease: Secondary | ICD-10-CM | POA: Diagnosis not present

## 2011-04-10 DIAGNOSIS — Z992 Dependence on renal dialysis: Secondary | ICD-10-CM | POA: Diagnosis not present

## 2011-04-10 DIAGNOSIS — N2581 Secondary hyperparathyroidism of renal origin: Secondary | ICD-10-CM | POA: Diagnosis not present

## 2011-04-10 DIAGNOSIS — D509 Iron deficiency anemia, unspecified: Secondary | ICD-10-CM | POA: Diagnosis not present

## 2011-04-12 DIAGNOSIS — Z992 Dependence on renal dialysis: Secondary | ICD-10-CM | POA: Diagnosis not present

## 2011-04-12 DIAGNOSIS — N186 End stage renal disease: Secondary | ICD-10-CM | POA: Diagnosis not present

## 2011-04-12 DIAGNOSIS — D509 Iron deficiency anemia, unspecified: Secondary | ICD-10-CM | POA: Diagnosis not present

## 2011-04-12 DIAGNOSIS — N2581 Secondary hyperparathyroidism of renal origin: Secondary | ICD-10-CM | POA: Diagnosis not present

## 2011-04-14 DIAGNOSIS — N186 End stage renal disease: Secondary | ICD-10-CM | POA: Diagnosis not present

## 2011-04-14 DIAGNOSIS — D509 Iron deficiency anemia, unspecified: Secondary | ICD-10-CM | POA: Diagnosis not present

## 2011-04-14 DIAGNOSIS — Z992 Dependence on renal dialysis: Secondary | ICD-10-CM | POA: Diagnosis not present

## 2011-04-14 DIAGNOSIS — N2581 Secondary hyperparathyroidism of renal origin: Secondary | ICD-10-CM | POA: Diagnosis not present

## 2011-04-17 DIAGNOSIS — D509 Iron deficiency anemia, unspecified: Secondary | ICD-10-CM | POA: Diagnosis not present

## 2011-04-17 DIAGNOSIS — Z992 Dependence on renal dialysis: Secondary | ICD-10-CM | POA: Diagnosis not present

## 2011-04-17 DIAGNOSIS — N186 End stage renal disease: Secondary | ICD-10-CM | POA: Diagnosis not present

## 2011-04-17 DIAGNOSIS — N2581 Secondary hyperparathyroidism of renal origin: Secondary | ICD-10-CM | POA: Diagnosis not present

## 2011-04-19 DIAGNOSIS — N2581 Secondary hyperparathyroidism of renal origin: Secondary | ICD-10-CM | POA: Diagnosis not present

## 2011-04-19 DIAGNOSIS — N186 End stage renal disease: Secondary | ICD-10-CM | POA: Diagnosis not present

## 2011-04-19 DIAGNOSIS — D509 Iron deficiency anemia, unspecified: Secondary | ICD-10-CM | POA: Diagnosis not present

## 2011-04-19 DIAGNOSIS — Z992 Dependence on renal dialysis: Secondary | ICD-10-CM | POA: Diagnosis not present

## 2011-04-21 DIAGNOSIS — N186 End stage renal disease: Secondary | ICD-10-CM | POA: Diagnosis not present

## 2011-04-21 DIAGNOSIS — Z992 Dependence on renal dialysis: Secondary | ICD-10-CM | POA: Diagnosis not present

## 2011-04-21 DIAGNOSIS — N2581 Secondary hyperparathyroidism of renal origin: Secondary | ICD-10-CM | POA: Diagnosis not present

## 2011-04-21 DIAGNOSIS — D509 Iron deficiency anemia, unspecified: Secondary | ICD-10-CM | POA: Diagnosis not present

## 2011-04-24 DIAGNOSIS — D509 Iron deficiency anemia, unspecified: Secondary | ICD-10-CM | POA: Diagnosis not present

## 2011-04-24 DIAGNOSIS — N2581 Secondary hyperparathyroidism of renal origin: Secondary | ICD-10-CM | POA: Diagnosis not present

## 2011-04-24 DIAGNOSIS — N186 End stage renal disease: Secondary | ICD-10-CM | POA: Diagnosis not present

## 2011-04-24 DIAGNOSIS — Z992 Dependence on renal dialysis: Secondary | ICD-10-CM | POA: Diagnosis not present

## 2011-04-26 DIAGNOSIS — D509 Iron deficiency anemia, unspecified: Secondary | ICD-10-CM | POA: Diagnosis not present

## 2011-04-26 DIAGNOSIS — N186 End stage renal disease: Secondary | ICD-10-CM | POA: Diagnosis not present

## 2011-04-26 DIAGNOSIS — Z992 Dependence on renal dialysis: Secondary | ICD-10-CM | POA: Diagnosis not present

## 2011-04-26 DIAGNOSIS — N2581 Secondary hyperparathyroidism of renal origin: Secondary | ICD-10-CM | POA: Diagnosis not present

## 2011-04-28 DIAGNOSIS — D509 Iron deficiency anemia, unspecified: Secondary | ICD-10-CM | POA: Diagnosis not present

## 2011-04-28 DIAGNOSIS — N186 End stage renal disease: Secondary | ICD-10-CM | POA: Diagnosis not present

## 2011-04-28 DIAGNOSIS — N2581 Secondary hyperparathyroidism of renal origin: Secondary | ICD-10-CM | POA: Diagnosis not present

## 2011-04-28 DIAGNOSIS — Z992 Dependence on renal dialysis: Secondary | ICD-10-CM | POA: Diagnosis not present

## 2011-04-30 ENCOUNTER — Encounter: Payer: Self-pay | Admitting: Vascular Surgery

## 2011-04-30 ENCOUNTER — Telehealth: Payer: Self-pay | Admitting: Family Medicine

## 2011-04-30 MED ORDER — INSULIN NPH (HUMAN) (ISOPHANE) 100 UNIT/ML ~~LOC~~ SUSP: 20.0000 [IU] | Freq: Two times a day (BID) | SUBCUTANEOUS | Status: DC

## 2011-04-30 NOTE — Telephone Encounter (Signed)
Refill: novolin insulin nph u-100 human. Qty 10. Inject 20 units into the skin twice daily.

## 2011-04-30 NOTE — Telephone Encounter (Signed)
rx sent to pharmacy by e-script  

## 2011-05-01 ENCOUNTER — Encounter: Payer: Self-pay | Admitting: Vascular Surgery

## 2011-05-01 ENCOUNTER — Encounter (HOSPITAL_COMMUNITY): Payer: Self-pay | Admitting: *Deleted

## 2011-05-01 ENCOUNTER — Encounter (HOSPITAL_COMMUNITY): Payer: Self-pay | Admitting: Pharmacy Technician

## 2011-05-01 ENCOUNTER — Other Ambulatory Visit: Payer: Self-pay | Admitting: *Deleted

## 2011-05-01 ENCOUNTER — Ambulatory Visit (INDEPENDENT_AMBULATORY_CARE_PROVIDER_SITE_OTHER): Payer: Medicare Other | Admitting: Vascular Surgery

## 2011-05-01 VITALS — BP 135/72 | HR 72 | Resp 20 | Ht 65.0 in | Wt 176.8 lb

## 2011-05-01 DIAGNOSIS — N186 End stage renal disease: Secondary | ICD-10-CM | POA: Insufficient documentation

## 2011-05-01 DIAGNOSIS — N2581 Secondary hyperparathyroidism of renal origin: Secondary | ICD-10-CM | POA: Diagnosis not present

## 2011-05-01 DIAGNOSIS — Z992 Dependence on renal dialysis: Secondary | ICD-10-CM | POA: Diagnosis not present

## 2011-05-01 DIAGNOSIS — D509 Iron deficiency anemia, unspecified: Secondary | ICD-10-CM | POA: Diagnosis not present

## 2011-05-01 MED ORDER — SODIUM CHLORIDE 0.9 % IV SOLN
INTRAVENOUS | Status: DC
  Administered 2011-05-02: 11:00:00 via INTRAVENOUS

## 2011-05-01 MED ORDER — DEXTROSE 5 % IV SOLN
1.5000 g | INTRAVENOUS | Status: AC
  Administered 2011-05-02: 1.5 g via INTRAVENOUS
  Filled 2011-05-01: qty 1.5

## 2011-05-01 NOTE — Progress Notes (Signed)
Subjective:     Patient ID: [name] [name], male   DOB: October 08, 1940, 71 y.o.   MRN: [id number]  HPI this 71 year old male with end-stage renal disease has a left upper arm AV fistula. There 2 problems. He has had some prolonged bleeding following punctures and also has had some marginal flow rates. He had a fistulogram performed in February of 2013 which I have reviewed. This revealed one prominent side branch coming from the fistula in the mid upper arm. There were no areas of significant narrowing or central line narrowing. Nothing was there to explain the prolonged bleeding following punctures. This has never been a serious issue. He does take Plavix and 2 aspirin each day for his coronary artery disease and history of cardiac stent.  Past Medical History  Diagnosis Date  . Diabetes mellitus   . Hyperlipidemia   . Hypertension   . Gout   . Hypothyroidism   . Low back pain   . History of colon cancer     2003   . Stroke   . Kidney disease, chronic, stage III (GFR 30-59 ml/min)   . Anemia     History  Substance Use Topics  . Smoking status: Never Smoker   . Smokeless tobacco: Never Used  . Alcohol Use: No    Family History  Problem Relation Age of Onset  . Heart disease Mother   . Hypertension Mother   . Diabetes Mother   . Heart disease Father   . Hypertension Father   . Diabetes Father     No Known Allergies  Current outpatient prescriptions:allopurinol (ZYLOPRIM) 100 MG tablet, Take 1 tablet (100 mg total) by mouth daily., Disp: 90 tablet, Rfl: 3;  amLODipine (NORVASC) 5 MG tablet, Take 0.5 tablets (2.5 mg total) by mouth daily., Disp: 90 tablet, Rfl: 3;  aspirin (ASPIRIN CHILDRENS) 81 MG chewable tablet, Chew 81 mg by mouth daily.  , Disp: , Rfl:  calcitRIOL (ROCALTROL) 0.5 MCG capsule, Take 1 capsule (0.5 mcg total) by mouth every other day., Disp: 90 capsule, Rfl: 0;  carvedilol (COREG) 25 MG tablet, Take 1 tablet (25 mg total) by mouth 2 (two) times daily with a meal.,  Disp: 180 tablet, Rfl: 2;  clopidogrel (PLAVIX) 75 MG tablet, Take 75 mg by mouth daily.  , Disp: , Rfl:  glucose blood (PRODIGY NO CODING BLOOD GLUC) test strip, 1 each by Other route as needed. Use as instructed.  3 month supply , Disp: , Rfl: ;  insulin NPH (NOVOLIN N) 100 UNIT/ML injection, Inject 20 Units into the skin 2 (two) times daily. 40 units, Disp: 10 mL, Rfl: 2;  levothyroxine (SYNTHROID, LEVOTHROID) 125 MCG tablet, TAKE 1 TABLET DAILY, Disp: 90 tablet, Rfl: 0;  Loperamide HCl (IMODIUM PO), Take by mouth as needed.  , Disp: , Rfl:  multivitamin (RENA-VIT) TABS tablet, Take 1 tablet by mouth daily., Disp: 30 tablet, Rfl: 12;  nitroGLYCERIN (NITROSTAT) 0.4 MG SL tablet, Place 1 tablet (0.4 mg total) under the tongue every 5 (five) minutes as needed for chest pain., Disp: 30 tablet, Rfl: 12;  temazepam (RESTORIL) 15 MG capsule, Take 1 capsule (15 mg total) by mouth at bedtime as needed., Disp: 90 capsule, Rfl: 3 terazosin (HYTRIN) 2 MG capsule, Take 2 mg by mouth at bedtime.  , Disp: , Rfl: ;  VYTORIN 10-20 MG per tablet, TAKE ONE-HALF (1/2) TABLET DAILY, Disp: 45 tablet, Rfl: 5;  Calcium Acetate 667 MG TABS, Take by mouth 3 (three) times daily. ,  Disp: , Rfl: ;  enalapril (VASOTEC) 20 MG tablet, Take 20 mg by mouth daily.  , Disp: , Rfl: ;  furosemide (LASIX) 80 MG tablet, , Disp: , Rfl:  ranolazine (RANEXA) 500 MG 12 hr tablet, Take 500 mg by mouth daily.  , Disp: , Rfl: ;  sodium bicarbonate 650 MG tablet, Take 650 mg by mouth. 2 tablets two times a day , Disp: , Rfl:   BP 135/72  Pulse 72  Resp 20  Ht 5\' 5"  (1.651 m)  Wt 176 lb 12.9 oz (80.2 kg)  BMI 29.42 kg/m2  Body mass index is 29.42 kg/(m^2).          Review of Systems     Objective:   Physical Exam blood pressure 100/72 heart rate 70 respirations 20 General well-developed well-nourished male no apparent stress alert and oriented x3 HEENT normal for age Lungs no rhonchi or wheezing Cardiovascular regular with no  murmurs carotid pulses were plus multiple bruits Upper extremity exam on the left reveals an AV fistula in the left upper arm brachial to cephalic with a good pulse and excellent thrill. There are prominent veins in the left forearm. Left radial pulses 1+ palpable. There is intact motion and sensation in the left hand with good strength. Left hand is slightly more dusky in appearance in the right hand. Right hand has a 2-3+ radial pulse palpable.  Today I performed a bedside SonoSite ultrasound exam and indeed there is a large side branch in the mid upper arm originating from the fistula     Assessment:     Poorly functioning AV fistula left upper arm with moderate sized side branch Also history of prolonged bleeding possibly due to antiplatelet medications    Plan:     #1 we'll schedule ligation of side branch left upper arm AV fistula tomorrow under local anesthesia #2 patient will discuss with Dr. Rollene Fare his cardiologist about modifying his antiplatelet medications which include Plavix and 2 aspirin per day

## 2011-05-01 NOTE — Progress Notes (Signed)
Requested records from Dr. Lowella Fairy office - Stress test, EKG, ECHO, last OV note.

## 2011-05-02 ENCOUNTER — Ambulatory Visit (HOSPITAL_COMMUNITY): Payer: Medicare Other | Admitting: Anesthesiology

## 2011-05-02 ENCOUNTER — Encounter (HOSPITAL_COMMUNITY): Payer: Self-pay | Admitting: Anesthesiology

## 2011-05-02 ENCOUNTER — Ambulatory Visit (HOSPITAL_COMMUNITY): Payer: Medicare Other

## 2011-05-02 ENCOUNTER — Encounter (HOSPITAL_COMMUNITY): Payer: Self-pay | Admitting: Surgery

## 2011-05-02 ENCOUNTER — Ambulatory Visit (HOSPITAL_COMMUNITY)
Admission: RE | Admit: 2011-05-02 | Discharge: 2011-05-02 | Disposition: A | Discharge status: Home / Self Care | Payer: Medicare Other | Source: Ambulatory Visit | Attending: Vascular Surgery | Admitting: Vascular Surgery

## 2011-05-02 ENCOUNTER — Encounter (HOSPITAL_COMMUNITY)
Admission: RE | Disposition: A | Discharge status: Home / Self Care | Payer: Self-pay | Source: Ambulatory Visit | Attending: Vascular Surgery

## 2011-05-02 DIAGNOSIS — T82898A Other specified complication of vascular prosthetic devices, implants and grafts, initial encounter: Secondary | ICD-10-CM | POA: Diagnosis not present

## 2011-05-02 DIAGNOSIS — I129 Hypertensive chronic kidney disease with stage 1 through stage 4 chronic kidney disease, or unspecified chronic kidney disease: Secondary | ICD-10-CM | POA: Insufficient documentation

## 2011-05-02 DIAGNOSIS — I10 Essential (primary) hypertension: Secondary | ICD-10-CM | POA: Diagnosis not present

## 2011-05-02 DIAGNOSIS — E119 Type 2 diabetes mellitus without complications: Secondary | ICD-10-CM | POA: Diagnosis not present

## 2011-05-02 DIAGNOSIS — Z01811 Encounter for preprocedural respiratory examination: Secondary | ICD-10-CM | POA: Diagnosis not present

## 2011-05-02 DIAGNOSIS — E785 Hyperlipidemia, unspecified: Secondary | ICD-10-CM | POA: Insufficient documentation

## 2011-05-02 DIAGNOSIS — N183 Chronic kidney disease, stage 3 unspecified: Secondary | ICD-10-CM | POA: Insufficient documentation

## 2011-05-02 DIAGNOSIS — Z8673 Personal history of transient ischemic attack (TIA), and cerebral infarction without residual deficits: Secondary | ICD-10-CM | POA: Diagnosis not present

## 2011-05-02 DIAGNOSIS — N189 Chronic kidney disease, unspecified: Secondary | ICD-10-CM

## 2011-05-02 DIAGNOSIS — I517 Cardiomegaly: Secondary | ICD-10-CM | POA: Diagnosis not present

## 2011-05-02 DIAGNOSIS — I770 Arteriovenous fistula, acquired: Secondary | ICD-10-CM | POA: Diagnosis not present

## 2011-05-02 DIAGNOSIS — N186 End stage renal disease: Secondary | ICD-10-CM

## 2011-05-02 DIAGNOSIS — Y849 Medical procedure, unspecified as the cause of abnormal reaction of the patient, or of later complication, without mention of misadventure at the time of the procedure: Secondary | ICD-10-CM | POA: Insufficient documentation

## 2011-05-02 LAB — POCT I-STAT 4, (NA,K, GLUC, HGB,HCT)
Glucose, Bld: 168 mg/dL — ABNORMAL HIGH (ref 70–99)
HCT: 39 % (ref 39.0–52.0)
Potassium: 3.8 mEq/L (ref 3.5–5.1)

## 2011-05-02 LAB — GLUCOSE, CAPILLARY: Glucose-Capillary: 121 mg/dL — ABNORMAL HIGH (ref 70–99)

## 2011-05-02 SURGERY — LIGATION OF COMPETING BRANCHES OF ARTERIOVENOUS FISTULA
Anesthesia: Monitor Anesthesia Care | Site: Arm Upper | Laterality: Left | Wound class: Clean

## 2011-05-02 MED ORDER — HYDROMORPHONE HCL PF 1 MG/ML IJ SOLN: 0.2500 mg | INTRAMUSCULAR | Status: DC | PRN

## 2011-05-02 MED ORDER — LIDOCAINE-EPINEPHRINE (PF) 1 %-1:200000 IJ SOLN
INTRAMUSCULAR | Status: DC | PRN
  Administered 2011-05-02: 2 mL via INTRADERMAL

## 2011-05-02 MED ORDER — ONDANSETRON HCL 4 MG/2ML IJ SOLN: 4.0000 mg | Freq: Once | INTRAMUSCULAR | Status: DC | PRN

## 2011-05-02 MED ORDER — SODIUM CHLORIDE 0.9 % IV SOLN
INTRAVENOUS | Status: DC | PRN
  Administered 2011-05-02: 11:00:00 via INTRAVENOUS

## 2011-05-02 MED ORDER — MUPIROCIN 2 % EX OINT
TOPICAL_OINTMENT | CUTANEOUS | Status: AC
  Administered 2011-05-02: 1 via NASAL
  Filled 2011-05-02: qty 22

## 2011-05-02 MED ORDER — 0.9 % SODIUM CHLORIDE (POUR BTL) OPTIME
TOPICAL | Status: DC | PRN
  Administered 2011-05-02: 1000 mL

## 2011-05-02 MED ORDER — MIDAZOLAM HCL 5 MG/5ML IJ SOLN
INTRAMUSCULAR | Status: DC | PRN
  Administered 2011-05-02: 1 mg via INTRAVENOUS

## 2011-05-02 MED ORDER — FENTANYL CITRATE 0.05 MG/ML IJ SOLN
INTRAMUSCULAR | Status: DC | PRN
  Administered 2011-05-02: 50 ug via INTRAVENOUS

## 2011-05-02 MED ORDER — CARVEDILOL 12.5 MG PO TABS
ORAL_TABLET | ORAL | Status: AC
  Administered 2011-05-02: 12.5 mg via ORAL
  Filled 2011-05-02: qty 2

## 2011-05-02 MED ORDER — CARVEDILOL 25 MG PO TABS
25.0000 mg | ORAL_TABLET | Freq: Two times a day (BID) | ORAL | Status: DC
  Administered 2011-05-02: 12.5 mg via ORAL

## 2011-05-02 MED ORDER — PROPOFOL 10 MG/ML IV EMUL
INTRAVENOUS | Status: DC | PRN
  Administered 2011-05-02 (×2): 10 mg via INTRAVENOUS
  Administered 2011-05-02: 30 mg via INTRAVENOUS
  Administered 2011-05-02 (×2): 10 mg via INTRAVENOUS

## 2011-05-02 MED ORDER — OXYCODONE HCL 5 MG PO TABS: 5.0000 mg | ORAL_TABLET | Freq: Three times a day (TID) | ORAL | Status: AC | PRN

## 2011-05-02 SURGICAL SUPPLY — 34 items
ADH SKN CLS APL DERMABOND .7 (GAUZE/BANDAGES/DRESSINGS) ×1
ADH SKN CLS LQ APL DERMABOND (GAUZE/BANDAGES/DRESSINGS) ×1
CANISTER SUCTION 2500CC (MISCELLANEOUS) ×2 IMPLANT
CLIP TI MEDIUM 6 (CLIP) IMPLANT
CLIP TI WIDE RED SMALL 6 (CLIP) IMPLANT
CLOTH BEACON ORANGE TIMEOUT ST (SAFETY) ×2 IMPLANT
COVER PROBE W GEL 5X96 (DRAPES) ×1 IMPLANT
COVER SURGICAL LIGHT HANDLE (MISCELLANEOUS) ×4 IMPLANT
DERMABOND ADHESIVE PROPEN (GAUZE/BANDAGES/DRESSINGS) ×1
DERMABOND ADVANCED (GAUZE/BANDAGES/DRESSINGS) ×1
DERMABOND ADVANCED .7 DNX12 (GAUZE/BANDAGES/DRESSINGS) ×1 IMPLANT
DERMABOND ADVANCED .7 DNX6 (GAUZE/BANDAGES/DRESSINGS) IMPLANT
ELECT REM PT RETURN 9FT ADLT (ELECTROSURGICAL) ×2
ELECTRODE REM PT RTRN 9FT ADLT (ELECTROSURGICAL) ×1 IMPLANT
GEL ULTRASOUND 20GR AQUASONIC (MISCELLANEOUS) IMPLANT
GLOVE SS BIOGEL STRL SZ 7 (GLOVE) ×1 IMPLANT
GLOVE SUPERSENSE BIOGEL SZ 7 (GLOVE) ×1
GOWN STRL NON-REIN LRG LVL3 (GOWN DISPOSABLE) ×4 IMPLANT
KIT BASIN OR (CUSTOM PROCEDURE TRAY) ×2 IMPLANT
KIT ROOM TURNOVER OR (KITS) ×2 IMPLANT
NS IRRIG 1000ML POUR BTL (IV SOLUTION) ×2 IMPLANT
PACK CV ACCESS (CUSTOM PROCEDURE TRAY) ×2 IMPLANT
PAD ARMBOARD 7.5X6 YLW CONV (MISCELLANEOUS) ×4 IMPLANT
SPONGE GAUZE 4X4 12PLY (GAUZE/BANDAGES/DRESSINGS) ×2 IMPLANT
SUT PROLENE 6 0 BV (SUTURE) IMPLANT
SUT SILK 0 TIES 10X30 (SUTURE) ×2 IMPLANT
SUT VIC AB 3-0 SH 27 (SUTURE) ×2
SUT VIC AB 3-0 SH 27X BRD (SUTURE) ×1 IMPLANT
SWAB COLLECTION DEVICE MRSA (MISCELLANEOUS) IMPLANT
TOWEL OR 17X24 6PK STRL BLUE (TOWEL DISPOSABLE) ×2 IMPLANT
TOWEL OR 17X26 10 PK STRL BLUE (TOWEL DISPOSABLE) ×2 IMPLANT
TUBE ANAEROBIC SPECIMEN COL (MISCELLANEOUS) IMPLANT
UNDERPAD 30X30 INCONTINENT (UNDERPADS AND DIAPERS) ×2 IMPLANT
WATER STERILE IRR 1000ML POUR (IV SOLUTION) ×2 IMPLANT

## 2011-05-02 NOTE — Transfer of Care (Signed)
Immediate Anesthesia Transfer of Care Note  Patient: [name] [name]  Procedure(s) Performed: Procedure(s) (LRB): LIGATION OF COMPETING BRANCHES OF ARTERIOVENOUS FISTULA (Left)  Patient Location: PACU  Anesthesia Type: MAC  Level of Consciousness: awake, alert  and oriented  Airway & Oxygen Therapy: Patient Spontanous Breathing and Patient connected to nasal cannula oxygen  Post-op Assessment: Report given to PACU RN, Post -op Vital signs reviewed and stable and Patient moving all extremities X 4  Post vital signs: Reviewed and stable  Complications: No apparent anesthesia complications

## 2011-05-02 NOTE — Op Note (Signed)
NAME:  ELIANA, FRIEDEL NO.:  [id number]  MEDICAL RECORD NO.:  [id number]  LOCATION:  [facility]                         FACILITY:  [facility]  PHYSICIAN:  Nelda Severe. Kellie Simmering, M.D.  DATE OF BIRTH:  Jan 19, 1940  DATE OF PROCEDURE:  05/02/2011 DATE OF DISCHARGE:  05/02/2011                              OPERATIVE REPORT   PREOPERATIVE DIAGNOSIS:  Poorly functioning left upper arm brachial artery due to cephalic vein arteriovenous  fistula.  POSTOPERATIVE DIAGNOSIS:  Poorly functioning left upper arm brachial artery due to cephalic vein arteriovenous  fistula.  OPERATION:  Ligation of competing branch, left upper arm brachial artery to cephalic vein AV fistula.  SURGEON:  Nelda Severe. Kellie Simmering, M.D.  FIRST ASSISTANT:  Gerri Lins, P.A.  ANESTHESIA:  MAC.  PROCEDURE:  The patient was taken to the operating room and placed in supine position, at which time left upper extremity was prepped with Betadine scrub and solution, draped in routine sterile manner.  Using the SonoSite ultrasound, the large side branch was identified in the mid upper arm and marked appropriately.  It extended lateral to the fistula. After infiltration of 1% Xylocaine, a short longitudinal incision was made over the branch.  Branch was dissected free, encircled with two 3-0 silk ties and it was doubly ligated.  Following this, it was once again imaged with the ultrasound confirming closure of the branch.  The incision was closed with Vicryl in a subcuticular fashion with Dermabond.  The patient was taken to recovery room in stable condition.     Nelda Severe Kellie Simmering, M.D.     JDL/MEDQ  D:  05/02/2011  T:  05/02/2011  Job:  [id number]

## 2011-05-02 NOTE — Discharge Instructions (Signed)
Instructions Following General Anesthetic, Adult A nurse specialized in giving anesthesia (anesthetist) or a doctor specialized in giving anesthesia (anesthesiologist) gave you a medicine that made you sleep while a procedure was performed. For as long as 24 hours following this procedure, you may feel:  Dizzy.   Weak.   Drowsy.  AFTER THE PROCEDURE After surgery, you will be taken to the recovery area where a nurse will monitor your progress. You will be allowed to go home when you are awake, stable, taking fluids well, and without complications. For the first 24 hours following an anesthetic:  Have a responsible person with you.   Do not drive a car. If you are alone, do not take public transportation.   Do not drink alcohol.   Do not take medicine that has not been prescribed by your caregiver.   Do not sign important papers or make important decisions.   You may resume normal diet and activities as directed.   Change bandages (dressings) as directed.   Only take over-the-counter or prescription medicines for pain, discomfort, or fever as directed by your caregiver.  If you have questions or problems that seem related to the anesthetic, call the hospital and ask for the anesthetist or anesthesiologist on call. SEEK IMMEDIATE MEDICAL CARE IF:   You develop a rash.   You have difficulty breathing.   You have chest pain.   You develop any allergic problems.  Document Released: 04/02/2000 Document Revised: 12/14/2010 Document Reviewed: 11/11/2006 [facility] Patient Information 2012 [place].

## 2011-05-02 NOTE — Progress Notes (Signed)
Linna Hoff PA states pt to return to HD center tomorrow at regular time

## 2011-05-02 NOTE — Preoperative (Signed)
Beta Blockers   Reason not to administer Beta Blockers:Not Applicable 

## 2011-05-02 NOTE — Anesthesia Postprocedure Evaluation (Signed)
  Anesthesia Post-op Note  Patient: [name] [name]  Procedure(s) Performed: Procedure(s) (LRB): LIGATION OF COMPETING BRANCHES OF ARTERIOVENOUS FISTULA (Left)  Patient Location: PACU  Anesthesia Type: MAC  Level of Consciousness: awake, alert  and oriented  Airway and Oxygen Therapy: Patient Spontanous Breathing and Patient connected to nasal cannula oxygen  Post-op Pain: mild  Post-op Assessment: Post-op Vital signs reviewed and Patient's Cardiovascular Status Stable  Post-op Vital Signs: stable  Complications: No apparent anesthesia complications

## 2011-05-02 NOTE — Anesthesia Preprocedure Evaluation (Addendum)
Anesthesia Evaluation  Patient identified by MRN, date of birth, ID band Patient awake    Reviewed: Allergy & Precautions, H&P , NPO status , Patient's Chart, lab work & pertinent test results  Airway Mallampati: II  Neck ROM: full  Mouth opening: Limited Mouth Opening  Dental  (+) Teeth Intact and Dental Advidsory Given   Pulmonary  breath sounds clear to auscultation        Cardiovascular hypertension, On Home Beta Blockers + CAD and +CHF Rhythm:Regular Rate:Normal     Neuro/Psych  Neuromuscular disease CVA, No Residual Symptoms    GI/Hepatic   Endo/Other  Diabetes mellitus-, Well Controlled, Type 1  Renal/GU      Musculoskeletal   Abdominal   Peds  Hematology   Anesthesia Other Findings   Reproductive/Obstetrics                          Anesthesia Physical Anesthesia Plan  ASA: III  Anesthesia Plan: MAC   Post-op Pain Management:    Induction: Intravenous  Airway Management Planned:   Additional Equipment:   Intra-op Plan:   Post-operative Plan: Extubation in OR  Informed Consent: I have reviewed the patients History and Physical, chart, labs and discussed the procedure including the risks, benefits and alternatives for the proposed anesthesia with the patient or authorized representative who has indicated his/her understanding and acceptance.   Dental advisory given and Dental Advisory Given  Plan Discussed with: Anesthesiologist and CRNA  Anesthesia Plan Comments: (ESRD last HD 4/23 K-3.8 Type 2 DM glucose 168 Htn CAD S/P PTCAs last 10/12 no recent angina  Plan MAC)       Anesthesia Quick Evaluation

## 2011-05-02 NOTE — H&P (View-Only) (Signed)
Subjective:     Patient ID: [name] [name], male   DOB: Aug 21, 1940, 71 y.o.   MRN: [id number]  HPI this 71 year old male with end-stage renal disease has a left upper arm AV fistula. There 2 problems. He has had some prolonged bleeding following punctures and also has had some marginal flow rates. He had a fistulogram performed in February of 2013 which I have reviewed. This revealed one prominent side branch coming from the fistula in the mid upper arm. There were no areas of significant narrowing or central line narrowing. Nothing was there to explain the prolonged bleeding following punctures. This has never been a serious issue. He does take Plavix and 2 aspirin each day for his coronary artery disease and history of cardiac stent.  Past Medical History  Diagnosis Date  . Diabetes mellitus   . Hyperlipidemia   . Hypertension   . Gout   . Hypothyroidism   . Low back pain   . History of colon cancer     2003   . Stroke   . Kidney disease, chronic, stage III (GFR 30-59 ml/min)   . Anemia     History  Substance Use Topics  . Smoking status: Never Smoker   . Smokeless tobacco: Never Used  . Alcohol Use: No    Family History  Problem Relation Age of Onset  . Heart disease Mother   . Hypertension Mother   . Diabetes Mother   . Heart disease Father   . Hypertension Father   . Diabetes Father     No Known Allergies  Current outpatient prescriptions:allopurinol (ZYLOPRIM) 100 MG tablet, Take 1 tablet (100 mg total) by mouth daily., Disp: 90 tablet, Rfl: 3;  amLODipine (NORVASC) 5 MG tablet, Take 0.5 tablets (2.5 mg total) by mouth daily., Disp: 90 tablet, Rfl: 3;  aspirin (ASPIRIN CHILDRENS) 81 MG chewable tablet, Chew 81 mg by mouth daily.  , Disp: , Rfl:  calcitRIOL (ROCALTROL) 0.5 MCG capsule, Take 1 capsule (0.5 mcg total) by mouth every other day., Disp: 90 capsule, Rfl: 0;  carvedilol (COREG) 25 MG tablet, Take 1 tablet (25 mg total) by mouth 2 (two) times daily with a meal.,  Disp: 180 tablet, Rfl: 2;  clopidogrel (PLAVIX) 75 MG tablet, Take 75 mg by mouth daily.  , Disp: , Rfl:  glucose blood (PRODIGY NO CODING BLOOD GLUC) test strip, 1 each by Other route as needed. Use as instructed.  3 month supply , Disp: , Rfl: ;  insulin NPH (NOVOLIN N) 100 UNIT/ML injection, Inject 20 Units into the skin 2 (two) times daily. 40 units, Disp: 10 mL, Rfl: 2;  levothyroxine (SYNTHROID, LEVOTHROID) 125 MCG tablet, TAKE 1 TABLET DAILY, Disp: 90 tablet, Rfl: 0;  Loperamide HCl (IMODIUM PO), Take by mouth as needed.  , Disp: , Rfl:  multivitamin (RENA-VIT) TABS tablet, Take 1 tablet by mouth daily., Disp: 30 tablet, Rfl: 12;  nitroGLYCERIN (NITROSTAT) 0.4 MG SL tablet, Place 1 tablet (0.4 mg total) under the tongue every 5 (five) minutes as needed for chest pain., Disp: 30 tablet, Rfl: 12;  temazepam (RESTORIL) 15 MG capsule, Take 1 capsule (15 mg total) by mouth at bedtime as needed., Disp: 90 capsule, Rfl: 3 terazosin (HYTRIN) 2 MG capsule, Take 2 mg by mouth at bedtime.  , Disp: , Rfl: ;  VYTORIN 10-20 MG per tablet, TAKE ONE-HALF (1/2) TABLET DAILY, Disp: 45 tablet, Rfl: 5;  Calcium Acetate 667 MG TABS, Take by mouth 3 (three) times daily. ,  Disp: , Rfl: ;  enalapril (VASOTEC) 20 MG tablet, Take 20 mg by mouth daily.  , Disp: , Rfl: ;  furosemide (LASIX) 80 MG tablet, , Disp: , Rfl:  ranolazine (RANEXA) 500 MG 12 hr tablet, Take 500 mg by mouth daily.  , Disp: , Rfl: ;  sodium bicarbonate 650 MG tablet, Take 650 mg by mouth. 2 tablets two times a day , Disp: , Rfl:   BP 135/72  Pulse 72  Resp 20  Ht 5\' 5"  (1.651 m)  Wt 176 lb 12.9 oz (80.2 kg)  BMI 29.42 kg/m2  Body mass index is 29.42 kg/(m^2).          Review of Systems     Objective:   Physical Exam blood pressure 100/72 heart rate 70 respirations 20 General well-developed well-nourished male no apparent stress alert and oriented x3 HEENT normal for age Lungs no rhonchi or wheezing Cardiovascular regular with no  murmurs carotid pulses were plus multiple bruits Upper extremity exam on the left reveals an AV fistula in the left upper arm brachial to cephalic with a good pulse and excellent thrill. There are prominent veins in the left forearm. Left radial pulses 1+ palpable. There is intact motion and sensation in the left hand with good strength. Left hand is slightly more dusky in appearance in the right hand. Right hand has a 2-3+ radial pulse palpable.  Today I performed a bedside SonoSite ultrasound exam and indeed there is a large side branch in the mid upper arm originating from the fistula     Assessment:     Poorly functioning AV fistula left upper arm with moderate sized side branch Also history of prolonged bleeding possibly due to antiplatelet medications    Plan:     #1 we'll schedule ligation of side branch left upper arm AV fistula tomorrow under local anesthesia #2 patient will discuss with Dr. Rollene Fare his cardiologist about modifying his antiplatelet medications which include Plavix and 2 aspirin per day

## 2011-05-02 NOTE — Interval H&P Note (Signed)
History and Physical Interval Note:  05/02/2011 11:34 AM  [name] [name]  has presented today for surgery, with the diagnosis of ESRD  The various methods of treatment have been discussed with the patient and family. After consideration of risks, benefits and other options for treatment, the patient has consented to  Procedure(s) (LRB): LIGATION OF COMPETING BRANCHES OF ARTERIOVENOUS FISTULA (Left) as a surgical intervention .  The patients' history has been reviewed, patient examined, no change in status, stable for surgery.  I have reviewed the patients' chart and labs.  Questions were answered to the patient's satisfaction.     Tinnie Gens

## 2011-05-03 DIAGNOSIS — D509 Iron deficiency anemia, unspecified: Secondary | ICD-10-CM | POA: Diagnosis not present

## 2011-05-03 DIAGNOSIS — E1129 Type 2 diabetes mellitus with other diabetic kidney complication: Secondary | ICD-10-CM | POA: Diagnosis not present

## 2011-05-03 DIAGNOSIS — N2581 Secondary hyperparathyroidism of renal origin: Secondary | ICD-10-CM | POA: Diagnosis not present

## 2011-05-03 DIAGNOSIS — Z992 Dependence on renal dialysis: Secondary | ICD-10-CM | POA: Diagnosis not present

## 2011-05-03 DIAGNOSIS — N186 End stage renal disease: Secondary | ICD-10-CM | POA: Diagnosis not present

## 2011-05-05 DIAGNOSIS — N186 End stage renal disease: Secondary | ICD-10-CM | POA: Diagnosis not present

## 2011-05-05 DIAGNOSIS — N2581 Secondary hyperparathyroidism of renal origin: Secondary | ICD-10-CM | POA: Diagnosis not present

## 2011-05-08 DIAGNOSIS — N2581 Secondary hyperparathyroidism of renal origin: Secondary | ICD-10-CM | POA: Diagnosis not present

## 2011-05-08 DIAGNOSIS — N186 End stage renal disease: Secondary | ICD-10-CM | POA: Diagnosis not present

## 2011-05-10 DIAGNOSIS — D509 Iron deficiency anemia, unspecified: Secondary | ICD-10-CM | POA: Diagnosis not present

## 2011-05-10 DIAGNOSIS — N2581 Secondary hyperparathyroidism of renal origin: Secondary | ICD-10-CM | POA: Diagnosis not present

## 2011-05-10 DIAGNOSIS — Z992 Dependence on renal dialysis: Secondary | ICD-10-CM | POA: Diagnosis not present

## 2011-05-10 DIAGNOSIS — N186 End stage renal disease: Secondary | ICD-10-CM | POA: Diagnosis not present

## 2011-05-23 DIAGNOSIS — I2510 Atherosclerotic heart disease of native coronary artery without angina pectoris: Secondary | ICD-10-CM | POA: Diagnosis not present

## 2011-05-23 DIAGNOSIS — E782 Mixed hyperlipidemia: Secondary | ICD-10-CM | POA: Diagnosis not present

## 2011-05-23 DIAGNOSIS — R079 Chest pain, unspecified: Secondary | ICD-10-CM | POA: Diagnosis not present

## 2011-05-23 DIAGNOSIS — I10 Essential (primary) hypertension: Secondary | ICD-10-CM | POA: Diagnosis not present

## 2011-05-26 DIAGNOSIS — N186 End stage renal disease: Secondary | ICD-10-CM | POA: Diagnosis not present

## 2011-06-06 DIAGNOSIS — R079 Chest pain, unspecified: Secondary | ICD-10-CM | POA: Diagnosis not present

## 2011-06-06 DIAGNOSIS — R0602 Shortness of breath: Secondary | ICD-10-CM | POA: Diagnosis not present

## 2011-06-06 DIAGNOSIS — I2510 Atherosclerotic heart disease of native coronary artery without angina pectoris: Secondary | ICD-10-CM | POA: Diagnosis not present

## 2011-06-06 DIAGNOSIS — E109 Type 1 diabetes mellitus without complications: Secondary | ICD-10-CM | POA: Diagnosis not present

## 2011-06-08 DIAGNOSIS — N186 End stage renal disease: Secondary | ICD-10-CM | POA: Diagnosis not present

## 2011-06-09 DIAGNOSIS — N2581 Secondary hyperparathyroidism of renal origin: Secondary | ICD-10-CM | POA: Diagnosis not present

## 2011-06-09 DIAGNOSIS — N186 End stage renal disease: Secondary | ICD-10-CM | POA: Diagnosis not present

## 2011-06-12 DIAGNOSIS — N186 End stage renal disease: Secondary | ICD-10-CM | POA: Diagnosis not present

## 2011-06-12 DIAGNOSIS — N2581 Secondary hyperparathyroidism of renal origin: Secondary | ICD-10-CM | POA: Diagnosis not present

## 2011-06-14 ENCOUNTER — Other Ambulatory Visit: Payer: Self-pay | Admitting: Family Medicine

## 2011-06-14 DIAGNOSIS — N2581 Secondary hyperparathyroidism of renal origin: Secondary | ICD-10-CM | POA: Diagnosis not present

## 2011-06-14 DIAGNOSIS — Z992 Dependence on renal dialysis: Secondary | ICD-10-CM | POA: Diagnosis not present

## 2011-06-14 DIAGNOSIS — N039 Chronic nephritic syndrome with unspecified morphologic changes: Secondary | ICD-10-CM | POA: Diagnosis not present

## 2011-06-14 DIAGNOSIS — D509 Iron deficiency anemia, unspecified: Secondary | ICD-10-CM | POA: Diagnosis not present

## 2011-06-14 DIAGNOSIS — N186 End stage renal disease: Secondary | ICD-10-CM | POA: Diagnosis not present

## 2011-06-14 DIAGNOSIS — D631 Anemia in chronic kidney disease: Secondary | ICD-10-CM | POA: Diagnosis not present

## 2011-06-14 NOTE — Telephone Encounter (Signed)
Ok to refill, #90 w/ 3 refills

## 2011-06-14 NOTE — Telephone Encounter (Signed)
Please advise is medication should be sent by [facility] instead of PCP

## 2011-06-14 NOTE — Telephone Encounter (Signed)
refill clopidogrel 75mg  dr reddy's Otho Darner 90 Take one tablet by mouth daily  Last fill 2.25.13 Last ov 3.11.13 CPE scheduled for 6.10.13

## 2011-06-15 MED ORDER — CLOPIDOGREL BISULFATE 75 MG PO TABS: 75.0000 mg | ORAL_TABLET | Freq: Every day | ORAL | Status: DC

## 2011-06-15 NOTE — Telephone Encounter (Signed)
rx sent to pharmacy by e-script  

## 2011-06-16 DIAGNOSIS — D509 Iron deficiency anemia, unspecified: Secondary | ICD-10-CM | POA: Diagnosis not present

## 2011-06-16 DIAGNOSIS — N039 Chronic nephritic syndrome with unspecified morphologic changes: Secondary | ICD-10-CM | POA: Diagnosis not present

## 2011-06-16 DIAGNOSIS — N2581 Secondary hyperparathyroidism of renal origin: Secondary | ICD-10-CM | POA: Diagnosis not present

## 2011-06-16 DIAGNOSIS — N186 End stage renal disease: Secondary | ICD-10-CM | POA: Diagnosis not present

## 2011-06-16 DIAGNOSIS — Z992 Dependence on renal dialysis: Secondary | ICD-10-CM | POA: Diagnosis not present

## 2011-06-18 ENCOUNTER — Encounter: Payer: Self-pay | Admitting: Family Medicine

## 2011-06-18 ENCOUNTER — Ambulatory Visit (INDEPENDENT_AMBULATORY_CARE_PROVIDER_SITE_OTHER): Payer: Medicare Other | Admitting: Family Medicine

## 2011-06-18 ENCOUNTER — Telehealth: Payer: Self-pay | Admitting: *Deleted

## 2011-06-18 VITALS — BP 126/79 | HR 60 | Temp 97.9°F | Ht 65.25 in | Wt 183.2 lb

## 2011-06-18 DIAGNOSIS — E785 Hyperlipidemia, unspecified: Secondary | ICD-10-CM

## 2011-06-18 DIAGNOSIS — E039 Hypothyroidism, unspecified: Secondary | ICD-10-CM | POA: Diagnosis not present

## 2011-06-18 DIAGNOSIS — D485 Neoplasm of uncertain behavior of skin: Secondary | ICD-10-CM

## 2011-06-18 DIAGNOSIS — Z0000 Encounter for general adult medical examination without abnormal findings: Secondary | ICD-10-CM

## 2011-06-18 DIAGNOSIS — Z125 Encounter for screening for malignant neoplasm of prostate: Secondary | ICD-10-CM

## 2011-06-18 DIAGNOSIS — I10 Essential (primary) hypertension: Secondary | ICD-10-CM | POA: Diagnosis not present

## 2011-06-18 DIAGNOSIS — E119 Type 2 diabetes mellitus without complications: Secondary | ICD-10-CM

## 2011-06-18 LAB — BASIC METABOLIC PANEL
Chloride: 98 mEq/L (ref 96–112)
GFR: 13.87 mL/min — CL (ref >60.00)
Potassium: 4.3 mEq/L (ref 3.5–5.1)
Sodium: 141 mEq/L (ref 135–145)

## 2011-06-18 LAB — CBC WITH DIFFERENTIAL/PLATELET
Eosinophils Absolute: 0.3 10*3/uL (ref 0.0–0.7)
Lymphocytes Relative: 9.8 % — ABNORMAL LOW (ref 12.0–46.0)
MCHC: 33.5 g/dL (ref 30.0–36.0)
MCV: 94.5 fl (ref 78.0–100.0)
Monocytes Absolute: 0.8 10*3/uL (ref 0.1–1.0)
Neutrophils Relative %: 77.2 % — ABNORMAL HIGH (ref 43.0–77.0)
Platelets: 265 10*3/uL (ref 150.0–400.0)
RBC: 3.71 Mil/uL — ABNORMAL LOW (ref 4.22–5.81)
WBC: 9.5 10*3/uL (ref 4.5–10.5)

## 2011-06-18 LAB — LIPID PANEL
HDL: 24.1 mg/dL — ABNORMAL LOW (ref >39.00)
LDL Cholesterol: 42 mg/dL (ref 0–99)
Total CHOL/HDL Ratio: 4
VLDL: 24.2 mg/dL (ref 0.0–40.0)

## 2011-06-18 LAB — HEPATIC FUNCTION PANEL
ALT: 18 U/L (ref 0–53)
AST: 19 U/L (ref 0–37)
Alkaline Phosphatase: 93 U/L (ref 39–117)
Bilirubin, Direct: 0.1 mg/dL (ref 0.0–0.3)
Total Bilirubin: 0.4 mg/dL (ref 0.3–1.2)

## 2011-06-18 LAB — PSA: PSA: 0.42 ng/mL (ref 0.10–4.00)

## 2011-06-18 NOTE — Progress Notes (Signed)
  Subjective:    Patient ID: [name] [name], male    DOB: 07-18-1940, 71 y.o.   MRN: [id number]  HPI Here today for CPE.  Risk Factors: DM- chronic problem, reports labile CBGs.  Taking Humalin 30 units BID.  Will forget to take meds.  UTD on eye exam.  Denies symptomatic lows. HTN- chronic problem, well controlled on Norvasc, Coreg.  No longer having hypotensive episodes after HD b/c renal adjusted dry weight.  Some CP- following w/ cards.  + SOB w/ exertion.  No HAs, visual changes, edema. Hyperlipidemia- chronic problem, on Vytorin.  Denies abd pain, N/V, myalgias. Physical Activity: no exercise w/ exception of daily activities Fall Risk: low risk, very steady on feet Depression: no current sxs Hearing:  Normal to conversational tones and whispered voice at 6 ft. ADL's: independent Cognitive: normal linear thought process, memory and attention intact Home Safety: safe at home. Height, Weight, BMI, Visual Acuity: see vitals, vision corrected to 20/20 w/ glasses Counseling: UTD on pneumovax, colonoscopy Labs Ordered: See A&P Care Plan: See A&P    Review of Systems Patient reports no vision/hearing changes, anorexia, fever ,adenopathy, persistant/recurrent hoarseness, swallowing issues, edema, persistant/recurrent cough, hemoptysis, gastrointestinal  bleeding (melena, rectal bleeding), abdominal pain, excessive heart burn, GU symptoms (dysuria, hematuria, voiding/incontinence issues) syncope, focal weakness, memory loss, hair/nail changes, depression, anxiety, abnormal bruising/bleeding, musculoskeletal symptoms/signs.   + solar changes on scalp and nose    Objective:   Physical Exam BP 126/79  Pulse 60  Temp 97.9 F (36.6 C) (Oral)  Ht 5' 5.25" (1.657 m)  Wt 183 lb 3.2 oz (83.099 kg)  BMI 30.25 kg/m2  SpO2 97%  General Appearance:    Alert, cooperative, no distress, appears stated age  Head:    Normocephalic, without obvious abnormality, atraumatic  Eyes:    PERRL,  conjunctiva/corneas clear, EOM's intact, fundi    benign, both eyes       Ears:    Normal TM's and external ear canals, both ears  Nose:   Nares normal, septum midline, mucosa normal, no drainage   or sinus tenderness  Throat:   Lips, mucosa, and tongue normal; teeth and gums normal  Neck:   Supple, symmetrical, trachea midline, no adenopathy;       thyroid:  No enlargement/tenderness/nodules  Back:     Symmetric, no curvature, ROM normal, no CVA tenderness  Lungs:     Clear to auscultation bilaterally, respirations unlabored  Chest wall:    No tenderness or deformity  Heart:    Regular rate and rhythm, S1 and S2 normal, II/VI SEM, rub   or gallop  Abdomen:     Soft, non-tender, bowel sounds active all four quadrants,    no masses, no organomegaly  Genitalia:    Normal male without lesion, discharge or tenderness  Rectal:    Normal tone, normal prostate, no masses or tenderness  Extremities:   Extremities normal, atraumatic, no cyanosis or edema  Pulses:   2+ and symmetric all extremities  Skin:   Skin color, texture, turgor normal, no rashes, solar damage noted on face  Lymph nodes:   Cervical, supraclavicular, and axillary nodes normal  Neurologic:   CNII-XII intact. Normal strength, sensation and reflexes      throughout          Assessment & Plan:

## 2011-06-18 NOTE — Telephone Encounter (Signed)
Received incoming call stating that pt had critical labs for Creatnine of 4.5 and GFR of 13.9, advised an incoming fax would be on the way soon, spoke to MD Tabori and she advised these numbers are within normal limits for this pt due to his current dialysis treatments. Will wait on remaining labs.

## 2011-06-18 NOTE — Patient Instructions (Addendum)
Follow up in 3 months to recheck diabetes We'll notify you of your lab results and make any changes if needed Remember to take your insulin! Someone will call you with your dermatology appt Call with any questions or concerns Have a great summer!!!

## 2011-06-19 ENCOUNTER — Encounter: Payer: Medicare Other | Admitting: Family Medicine

## 2011-06-19 DIAGNOSIS — N186 End stage renal disease: Secondary | ICD-10-CM | POA: Diagnosis not present

## 2011-06-19 DIAGNOSIS — N2581 Secondary hyperparathyroidism of renal origin: Secondary | ICD-10-CM | POA: Diagnosis not present

## 2011-06-19 DIAGNOSIS — N039 Chronic nephritic syndrome with unspecified morphologic changes: Secondary | ICD-10-CM | POA: Diagnosis not present

## 2011-06-19 DIAGNOSIS — Z992 Dependence on renal dialysis: Secondary | ICD-10-CM | POA: Diagnosis not present

## 2011-06-19 DIAGNOSIS — D509 Iron deficiency anemia, unspecified: Secondary | ICD-10-CM | POA: Diagnosis not present

## 2011-06-21 DIAGNOSIS — D509 Iron deficiency anemia, unspecified: Secondary | ICD-10-CM | POA: Diagnosis not present

## 2011-06-21 DIAGNOSIS — N2581 Secondary hyperparathyroidism of renal origin: Secondary | ICD-10-CM | POA: Diagnosis not present

## 2011-06-21 DIAGNOSIS — Z992 Dependence on renal dialysis: Secondary | ICD-10-CM | POA: Diagnosis not present

## 2011-06-21 DIAGNOSIS — D631 Anemia in chronic kidney disease: Secondary | ICD-10-CM | POA: Diagnosis not present

## 2011-06-21 DIAGNOSIS — N186 End stage renal disease: Secondary | ICD-10-CM | POA: Diagnosis not present

## 2011-06-23 DIAGNOSIS — N039 Chronic nephritic syndrome with unspecified morphologic changes: Secondary | ICD-10-CM | POA: Diagnosis not present

## 2011-06-23 DIAGNOSIS — D631 Anemia in chronic kidney disease: Secondary | ICD-10-CM | POA: Diagnosis not present

## 2011-06-23 DIAGNOSIS — N186 End stage renal disease: Secondary | ICD-10-CM | POA: Diagnosis not present

## 2011-06-23 DIAGNOSIS — D509 Iron deficiency anemia, unspecified: Secondary | ICD-10-CM | POA: Diagnosis not present

## 2011-06-23 DIAGNOSIS — Z992 Dependence on renal dialysis: Secondary | ICD-10-CM | POA: Diagnosis not present

## 2011-06-23 DIAGNOSIS — N2581 Secondary hyperparathyroidism of renal origin: Secondary | ICD-10-CM | POA: Diagnosis not present

## 2011-06-26 DIAGNOSIS — N186 End stage renal disease: Secondary | ICD-10-CM | POA: Diagnosis not present

## 2011-06-26 DIAGNOSIS — N039 Chronic nephritic syndrome with unspecified morphologic changes: Secondary | ICD-10-CM | POA: Diagnosis not present

## 2011-06-26 DIAGNOSIS — D631 Anemia in chronic kidney disease: Secondary | ICD-10-CM | POA: Diagnosis not present

## 2011-06-26 DIAGNOSIS — D509 Iron deficiency anemia, unspecified: Secondary | ICD-10-CM | POA: Diagnosis not present

## 2011-06-26 DIAGNOSIS — Z992 Dependence on renal dialysis: Secondary | ICD-10-CM | POA: Diagnosis not present

## 2011-06-26 DIAGNOSIS — N2581 Secondary hyperparathyroidism of renal origin: Secondary | ICD-10-CM | POA: Diagnosis not present

## 2011-06-28 DIAGNOSIS — N2581 Secondary hyperparathyroidism of renal origin: Secondary | ICD-10-CM | POA: Diagnosis not present

## 2011-06-28 DIAGNOSIS — N186 End stage renal disease: Secondary | ICD-10-CM | POA: Diagnosis not present

## 2011-06-28 DIAGNOSIS — D631 Anemia in chronic kidney disease: Secondary | ICD-10-CM | POA: Diagnosis not present

## 2011-06-28 DIAGNOSIS — D509 Iron deficiency anemia, unspecified: Secondary | ICD-10-CM | POA: Diagnosis not present

## 2011-06-28 DIAGNOSIS — Z992 Dependence on renal dialysis: Secondary | ICD-10-CM | POA: Diagnosis not present

## 2011-06-30 DIAGNOSIS — D631 Anemia in chronic kidney disease: Secondary | ICD-10-CM | POA: Diagnosis not present

## 2011-06-30 DIAGNOSIS — N2581 Secondary hyperparathyroidism of renal origin: Secondary | ICD-10-CM | POA: Diagnosis not present

## 2011-06-30 DIAGNOSIS — Z992 Dependence on renal dialysis: Secondary | ICD-10-CM | POA: Diagnosis not present

## 2011-06-30 DIAGNOSIS — N186 End stage renal disease: Secondary | ICD-10-CM | POA: Diagnosis not present

## 2011-06-30 DIAGNOSIS — D509 Iron deficiency anemia, unspecified: Secondary | ICD-10-CM | POA: Diagnosis not present

## 2011-07-01 NOTE — Assessment & Plan Note (Signed)
Chronic problem.  Check labs.  Adjust meds prn  

## 2011-07-01 NOTE — Assessment & Plan Note (Signed)
Chronic problem.  No longer having episodes of low BP after HD due to dry weight adjustment.  Currently asymptomatic.  Will continue to follow.

## 2011-07-01 NOTE — Assessment & Plan Note (Signed)
Chronic problem.  Tolerating meds w/out difficulty.  Check labs.  Adjust meds prn  

## 2011-07-01 NOTE — Assessment & Plan Note (Signed)
Chronic problem.  UTD on eye exam.  Admits to forgetting insulin.  Stressed importance of regular meds.  Check labs.  Adjust regimen prn.  Pt expressed understanding and is in agreement w/ plan.

## 2011-07-01 NOTE — Assessment & Plan Note (Signed)
Pt's PE WNL w/ exception of solar changes (will refer to derm), fistula in L upper arm for HD access.  UTD on health maintenance.  Check labs.  Anticipatory guidance provided.

## 2011-07-01 NOTE — Assessment & Plan Note (Signed)
New.  Refer to derm.

## 2011-07-03 DIAGNOSIS — N039 Chronic nephritic syndrome with unspecified morphologic changes: Secondary | ICD-10-CM | POA: Diagnosis not present

## 2011-07-03 DIAGNOSIS — Z992 Dependence on renal dialysis: Secondary | ICD-10-CM | POA: Diagnosis not present

## 2011-07-03 DIAGNOSIS — N2581 Secondary hyperparathyroidism of renal origin: Secondary | ICD-10-CM | POA: Diagnosis not present

## 2011-07-03 DIAGNOSIS — D509 Iron deficiency anemia, unspecified: Secondary | ICD-10-CM | POA: Diagnosis not present

## 2011-07-03 DIAGNOSIS — D631 Anemia in chronic kidney disease: Secondary | ICD-10-CM | POA: Diagnosis not present

## 2011-07-03 DIAGNOSIS — N186 End stage renal disease: Secondary | ICD-10-CM | POA: Diagnosis not present

## 2011-07-05 DIAGNOSIS — D509 Iron deficiency anemia, unspecified: Secondary | ICD-10-CM | POA: Diagnosis not present

## 2011-07-05 DIAGNOSIS — Z992 Dependence on renal dialysis: Secondary | ICD-10-CM | POA: Diagnosis not present

## 2011-07-05 DIAGNOSIS — N2581 Secondary hyperparathyroidism of renal origin: Secondary | ICD-10-CM | POA: Diagnosis not present

## 2011-07-05 DIAGNOSIS — D631 Anemia in chronic kidney disease: Secondary | ICD-10-CM | POA: Diagnosis not present

## 2011-07-05 DIAGNOSIS — N186 End stage renal disease: Secondary | ICD-10-CM | POA: Diagnosis not present

## 2011-07-07 DIAGNOSIS — D631 Anemia in chronic kidney disease: Secondary | ICD-10-CM | POA: Diagnosis not present

## 2011-07-07 DIAGNOSIS — Z992 Dependence on renal dialysis: Secondary | ICD-10-CM | POA: Diagnosis not present

## 2011-07-07 DIAGNOSIS — N2581 Secondary hyperparathyroidism of renal origin: Secondary | ICD-10-CM | POA: Diagnosis not present

## 2011-07-07 DIAGNOSIS — N186 End stage renal disease: Secondary | ICD-10-CM | POA: Diagnosis not present

## 2011-07-07 DIAGNOSIS — D509 Iron deficiency anemia, unspecified: Secondary | ICD-10-CM | POA: Diagnosis not present

## 2011-07-08 DIAGNOSIS — N186 End stage renal disease: Secondary | ICD-10-CM | POA: Diagnosis not present

## 2011-07-10 DIAGNOSIS — Z992 Dependence on renal dialysis: Secondary | ICD-10-CM | POA: Diagnosis not present

## 2011-07-10 DIAGNOSIS — N186 End stage renal disease: Secondary | ICD-10-CM | POA: Diagnosis not present

## 2011-07-10 DIAGNOSIS — D509 Iron deficiency anemia, unspecified: Secondary | ICD-10-CM | POA: Diagnosis not present

## 2011-07-10 DIAGNOSIS — N2581 Secondary hyperparathyroidism of renal origin: Secondary | ICD-10-CM | POA: Diagnosis not present

## 2011-07-10 DIAGNOSIS — D631 Anemia in chronic kidney disease: Secondary | ICD-10-CM | POA: Diagnosis not present

## 2011-07-23 ENCOUNTER — Ambulatory Visit (INDEPENDENT_AMBULATORY_CARE_PROVIDER_SITE_OTHER): Payer: Medicare Other | Admitting: Ophthalmology

## 2011-07-23 DIAGNOSIS — E11319 Type 2 diabetes mellitus with unspecified diabetic retinopathy without macular edema: Secondary | ICD-10-CM | POA: Diagnosis not present

## 2011-07-23 DIAGNOSIS — H43819 Vitreous degeneration, unspecified eye: Secondary | ICD-10-CM | POA: Diagnosis not present

## 2011-07-23 DIAGNOSIS — E1139 Type 2 diabetes mellitus with other diabetic ophthalmic complication: Secondary | ICD-10-CM | POA: Diagnosis not present

## 2011-07-23 DIAGNOSIS — E1165 Type 2 diabetes mellitus with hyperglycemia: Secondary | ICD-10-CM | POA: Diagnosis not present

## 2011-07-25 ENCOUNTER — Encounter: Payer: Self-pay | Admitting: Family Medicine

## 2011-07-25 ENCOUNTER — Ambulatory Visit (INDEPENDENT_AMBULATORY_CARE_PROVIDER_SITE_OTHER): Payer: Medicare Other | Admitting: Family Medicine

## 2011-07-25 ENCOUNTER — Other Ambulatory Visit: Payer: Self-pay | Admitting: Family Medicine

## 2011-07-25 VITALS — BP 115/72 | HR 71 | Temp 98.3°F | Ht 66.25 in | Wt 180.0 lb

## 2011-07-25 DIAGNOSIS — N63 Unspecified lump in unspecified breast: Secondary | ICD-10-CM | POA: Diagnosis not present

## 2011-07-25 MED ORDER — INSULIN NPH (HUMAN) (ISOPHANE) 100 UNIT/ML ~~LOC~~ SUSP: 30.0000 [IU] | Freq: Two times a day (BID) | SUBCUTANEOUS | Status: DC

## 2011-07-25 NOTE — Telephone Encounter (Signed)
HUMULIN N INSULIN (HI-310) QTY:10 REFILL:07/25/11 INJECT 30 UNITS INTO THE SKIN TWICE DAILY

## 2011-07-25 NOTE — Assessment & Plan Note (Signed)
New.  Get [place] to assess and r/o malignancy.

## 2011-07-25 NOTE — Patient Instructions (Addendum)
Follow up as scheduled We'll call you with your [place] appt

## 2011-07-25 NOTE — Progress Notes (Signed)
  Subjective:    Patient ID: [name] [name], male    DOB: 1940/10/28, 71 y.o.   MRN: [id number]  HPI Breast lump- L sided, first noticed 6 weeks ago.  Not painful.  No redness or skin changes.  No lump on R side.  Not sure if lump is growing or changing.  No drainage.  No family hx of breast cancer.   Review of Systems For ROS see HPI     Objective:   Physical Exam  Vitals reviewed. Constitutional: He appears well-developed and well-nourished. No distress.  Pulmonary/Chest: Right breast exhibits no inverted nipple, no mass, no nipple discharge, no skin change and no tenderness. Left breast exhibits mass (small but palpable lump under nipple w/ extension to superior areolar margin) and tenderness (over lump). Left breast exhibits no inverted nipple, no nipple discharge and no skin change.          Assessment & Plan:

## 2011-07-26 ENCOUNTER — Other Ambulatory Visit: Payer: Self-pay | Admitting: Nephrology

## 2011-07-26 ENCOUNTER — Ambulatory Visit
Admission: RE | Admit: 2011-07-26 | Discharge: 2011-07-26 | Disposition: A | Discharge status: Home / Self Care | Payer: Medicare Other | Source: Ambulatory Visit | Attending: Nephrology | Admitting: Nephrology

## 2011-07-26 DIAGNOSIS — M545 Low back pain: Secondary | ICD-10-CM | POA: Diagnosis not present

## 2011-07-26 DIAGNOSIS — M1990 Unspecified osteoarthritis, unspecified site: Secondary | ICD-10-CM

## 2011-07-26 NOTE — Telephone Encounter (Signed)
Medication was sent to pharmacy yesterday per pt in office at that time to [facility]

## 2011-07-26 NOTE — Addendum Note (Signed)
Addended by: Midge Minium on: 07/26/2011 02:23 PM   Modules accepted: Orders

## 2011-08-01 ENCOUNTER — Ambulatory Visit
Admission: RE | Admit: 2011-08-01 | Discharge: 2011-08-01 | Disposition: A | Discharge status: Home / Self Care | Payer: Medicare Other | Source: Ambulatory Visit | Attending: Family Medicine | Admitting: Family Medicine

## 2011-08-01 DIAGNOSIS — N62 Hypertrophy of breast: Secondary | ICD-10-CM | POA: Diagnosis not present

## 2011-08-01 DIAGNOSIS — N63 Unspecified lump in unspecified breast: Secondary | ICD-10-CM

## 2011-08-02 DIAGNOSIS — E1129 Type 2 diabetes mellitus with other diabetic kidney complication: Secondary | ICD-10-CM | POA: Diagnosis not present

## 2011-08-03 ENCOUNTER — Other Ambulatory Visit: Payer: Self-pay | Admitting: Dermatology

## 2011-08-03 DIAGNOSIS — L570 Actinic keratosis: Secondary | ICD-10-CM | POA: Diagnosis not present

## 2011-08-03 DIAGNOSIS — D485 Neoplasm of uncertain behavior of skin: Secondary | ICD-10-CM | POA: Diagnosis not present

## 2011-08-08 ENCOUNTER — Telehealth: Payer: Self-pay | Admitting: *Deleted

## 2011-08-08 DIAGNOSIS — N186 End stage renal disease: Secondary | ICD-10-CM | POA: Diagnosis not present

## 2011-08-08 NOTE — Telephone Encounter (Signed)
Prior auth approved 08-08-11 until 01-08-12, pharmacy faxed.

## 2011-08-09 DIAGNOSIS — N186 End stage renal disease: Secondary | ICD-10-CM | POA: Diagnosis not present

## 2011-08-09 DIAGNOSIS — D631 Anemia in chronic kidney disease: Secondary | ICD-10-CM | POA: Diagnosis not present

## 2011-08-09 DIAGNOSIS — N2581 Secondary hyperparathyroidism of renal origin: Secondary | ICD-10-CM | POA: Diagnosis not present

## 2011-08-09 DIAGNOSIS — E209 Hypoparathyroidism, unspecified: Secondary | ICD-10-CM | POA: Diagnosis not present

## 2011-08-09 DIAGNOSIS — D509 Iron deficiency anemia, unspecified: Secondary | ICD-10-CM | POA: Diagnosis not present

## 2011-08-09 DIAGNOSIS — Z992 Dependence on renal dialysis: Secondary | ICD-10-CM | POA: Diagnosis not present

## 2011-09-08 DIAGNOSIS — N186 End stage renal disease: Secondary | ICD-10-CM | POA: Diagnosis not present

## 2011-09-11 DIAGNOSIS — N186 End stage renal disease: Secondary | ICD-10-CM | POA: Diagnosis not present

## 2011-09-11 DIAGNOSIS — N2581 Secondary hyperparathyroidism of renal origin: Secondary | ICD-10-CM | POA: Diagnosis not present

## 2011-09-11 DIAGNOSIS — D509 Iron deficiency anemia, unspecified: Secondary | ICD-10-CM | POA: Diagnosis not present

## 2011-09-11 DIAGNOSIS — D631 Anemia in chronic kidney disease: Secondary | ICD-10-CM | POA: Diagnosis not present

## 2011-09-11 DIAGNOSIS — Z992 Dependence on renal dialysis: Secondary | ICD-10-CM | POA: Diagnosis not present

## 2011-09-13 DIAGNOSIS — N186 End stage renal disease: Secondary | ICD-10-CM | POA: Diagnosis not present

## 2011-09-13 DIAGNOSIS — N2581 Secondary hyperparathyroidism of renal origin: Secondary | ICD-10-CM | POA: Diagnosis not present

## 2011-09-15 DIAGNOSIS — N186 End stage renal disease: Secondary | ICD-10-CM | POA: Diagnosis not present

## 2011-09-15 DIAGNOSIS — N2581 Secondary hyperparathyroidism of renal origin: Secondary | ICD-10-CM | POA: Diagnosis not present

## 2011-09-17 ENCOUNTER — Encounter: Payer: Self-pay | Admitting: Family Medicine

## 2011-09-17 ENCOUNTER — Ambulatory Visit (INDEPENDENT_AMBULATORY_CARE_PROVIDER_SITE_OTHER): Payer: Medicare Other | Admitting: Family Medicine

## 2011-09-17 VITALS — BP 135/85 | HR 65 | Temp 98.2°F | Ht 64.75 in | Wt 181.6 lb

## 2011-09-17 DIAGNOSIS — R079 Chest pain, unspecified: Secondary | ICD-10-CM | POA: Diagnosis not present

## 2011-09-17 DIAGNOSIS — E119 Type 2 diabetes mellitus without complications: Secondary | ICD-10-CM

## 2011-09-17 DIAGNOSIS — N63 Unspecified lump in unspecified breast: Secondary | ICD-10-CM | POA: Diagnosis not present

## 2011-09-17 DIAGNOSIS — I10 Essential (primary) hypertension: Secondary | ICD-10-CM

## 2011-09-17 LAB — BASIC METABOLIC PANEL
BUN: 29 mg/dL — ABNORMAL HIGH (ref 6–23)
Creatinine, Ser: 3.1 mg/dL — ABNORMAL HIGH (ref 0.4–1.5)
GFR: 21.5 mL/min — ABNORMAL LOW (ref >60.00)
Glucose, Bld: 178 mg/dL — ABNORMAL HIGH (ref 70–99)
Potassium: 3.9 mEq/L (ref 3.5–5.1)

## 2011-09-17 NOTE — Patient Instructions (Addendum)
Follow up in 3 months to recheck diabetes and cholesterol If you have worsening chest pressure, irregular heart beat or other concerns- please call Dr Rollene Fare or go to the ER [facility] notify you of your lab results Call with any questions or concerns Hang in there!!!

## 2011-09-17 NOTE — Progress Notes (Signed)
  Subjective:    Patient ID: [name] [name], male    DOB: 1940-06-02, 71 y.o.   MRN: [id number]  HPI HTN- chronic problem, on Norvasc, Coreg.  BP is higher today than usual but when he finishes HD BP is 100/40-50s.  + CP (see below), no SOB, HAs, visual changes, edema.  DM- chronic problem, CBGs labile- 178 today.  Denies symptomatic lows.  Taking 30 units NPH BID.  Chest pressure- 'it feels like my heart's beating too fast'.  Has cards appt on Friday.  Refusing to call for sooner appt.  Denies current sxs.  Nitro improved sxs.  Last occurred Saturday night.  Had similar sxs Friday night, resolved spontaneously.  Episode lasted 5 minutes.  No SOB, N/V, diaphoresis.  Hx of CAD and chest pain.  Breast lump- pt had mammo but didn't understand what he was told by radiology.  Still having discomfort of L breast   Review of Systems For ROS see HPI     Objective:   Physical Exam  Vitals reviewed. Constitutional: He is oriented to person, place, and time. He appears well-developed and well-nourished. No distress.  HENT:  Head: Normocephalic and atraumatic.  Eyes: Conjunctivae and EOM are normal. Pupils are equal, round, and reactive to light.  Neck: Normal range of motion. Neck supple. No thyromegaly present.  Cardiovascular: Normal rate, regular rhythm and intact distal pulses.   Murmur (II-III/VI SEM, 'whooshing') heard. Pulmonary/Chest: Effort normal and breath sounds normal. No respiratory distress.  Abdominal: Soft. Bowel sounds are normal. He exhibits no distension.  Musculoskeletal: He exhibits no edema.  Lymphadenopathy:    He has no cervical adenopathy.  Neurological: He is alert and oriented to person, place, and time. No cranial nerve deficit.  Skin: Skin is warm and dry.  Psychiatric: He has a normal mood and affect. His behavior is normal.          Assessment & Plan:

## 2011-09-18 NOTE — Assessment & Plan Note (Signed)
Recurrent issue for pt.  Not currently symptomatic.  Has appt upcoming w/ cards.  Encouraged him to call for earlier appt- pt refused.  Pain improved w/ nitro, 'i'll ask him about it when i go'.  Reviewed my concerns w/ pt- he understands but still elects to wait for Friday.  Reviewed red flags that should prompt immediate attention- pt expressed understanding.

## 2011-09-18 NOTE — Assessment & Plan Note (Signed)
Chronic problem.  CBGs labile due to inconsistent intake.  Pt adjusting insulin dose based on CBGs.  Check labs.  Will adjust NPH dose prn.

## 2011-09-18 NOTE — Assessment & Plan Note (Signed)
Chronic problem.  BP higher than recently but pt tends to become hypotensive after HD.  No med adjustments at this time.  Will continue to follow.

## 2011-09-18 NOTE — Assessment & Plan Note (Signed)
Unchanged.  Reviewed mammo results w/ pt and discussed how this can be a consequence of renal failure.  Encouraged ibuprofen prn for pain relief.  Will follow.

## 2011-09-21 ENCOUNTER — Other Ambulatory Visit: Payer: Self-pay | Admitting: Family Medicine

## 2011-09-21 DIAGNOSIS — R079 Chest pain, unspecified: Secondary | ICD-10-CM | POA: Diagnosis not present

## 2011-09-21 DIAGNOSIS — E782 Mixed hyperlipidemia: Secondary | ICD-10-CM | POA: Diagnosis not present

## 2011-09-21 DIAGNOSIS — I2510 Atherosclerotic heart disease of native coronary artery without angina pectoris: Secondary | ICD-10-CM | POA: Diagnosis not present

## 2011-09-21 MED ORDER — TEMAZEPAM 15 MG PO CAPS: 15.0000 mg | ORAL_CAPSULE | Freq: Every evening | ORAL | Status: DC | PRN

## 2011-09-21 NOTE — Addendum Note (Signed)
Addended by: Logan Bores on: 09/21/2011 05:52 PM   Modules accepted: Orders

## 2011-09-21 NOTE — Telephone Encounter (Signed)
RX was printed off in Dr.Hopper's name in error, That rx was shredded and rx was called in under Dr.Tabori's name

## 2011-09-21 NOTE — Telephone Encounter (Signed)
refill temazepam 15mg  capsules  #90 last fill 8.3.13---take on capsule by mouth every night at bedtime as needed Last ov 9.9.13 f/u 250.00/401.9

## 2011-09-21 NOTE — Telephone Encounter (Signed)
Ok for #90, 1 refill 

## 2011-09-21 NOTE — Telephone Encounter (Signed)
Dr.Tabori please advise, last filled 03/19/11. Last OV 09/17/11

## 2011-10-03 DIAGNOSIS — N186 End stage renal disease: Secondary | ICD-10-CM | POA: Diagnosis not present

## 2011-10-04 ENCOUNTER — Telehealth: Payer: Self-pay | Admitting: *Deleted

## 2011-10-04 NOTE — Telephone Encounter (Signed)
Called pt to verify if he is still using [facility] supplies, pt notes that he already received the supplies

## 2011-10-08 DIAGNOSIS — N186 End stage renal disease: Secondary | ICD-10-CM | POA: Diagnosis not present

## 2011-10-09 DIAGNOSIS — D509 Iron deficiency anemia, unspecified: Secondary | ICD-10-CM | POA: Diagnosis not present

## 2011-10-09 DIAGNOSIS — N186 End stage renal disease: Secondary | ICD-10-CM | POA: Diagnosis not present

## 2011-10-09 DIAGNOSIS — N2581 Secondary hyperparathyroidism of renal origin: Secondary | ICD-10-CM | POA: Diagnosis not present

## 2011-10-12 DIAGNOSIS — R0602 Shortness of breath: Secondary | ICD-10-CM | POA: Diagnosis not present

## 2011-10-12 DIAGNOSIS — R079 Chest pain, unspecified: Secondary | ICD-10-CM | POA: Diagnosis not present

## 2011-10-12 DIAGNOSIS — I2510 Atherosclerotic heart disease of native coronary artery without angina pectoris: Secondary | ICD-10-CM | POA: Diagnosis not present

## 2011-10-12 DIAGNOSIS — I447 Left bundle-branch block, unspecified: Secondary | ICD-10-CM | POA: Diagnosis not present

## 2011-10-12 HISTORY — PX: NM MYOCAR PERF WALL MOTION: HXRAD629

## 2011-10-15 ENCOUNTER — Telehealth: Payer: Self-pay | Admitting: Family Medicine

## 2011-10-15 MED ORDER — INSULIN NPH (HUMAN) (ISOPHANE) 100 UNIT/ML ~~LOC~~ SUSP: 30.0000 [IU] | Freq: Two times a day (BID) | SUBCUTANEOUS | Status: DC

## 2011-10-15 NOTE — Telephone Encounter (Signed)
rx sent to pharmacy by e-script  

## 2011-10-15 NOTE — Telephone Encounter (Signed)
Refill: Novolin insulin nph u-100 human. Inject 30 units into the skin twice daily. Qty 10. Last fill 9.16.13

## 2011-10-17 ENCOUNTER — Other Ambulatory Visit: Payer: Self-pay | Admitting: *Deleted

## 2011-10-17 MED ORDER — INSULIN NPH (HUMAN) (ISOPHANE) 100 UNIT/ML ~~LOC~~ SUSP: 30.0000 [IU] | Freq: Two times a day (BID) | SUBCUTANEOUS | Status: DC

## 2011-10-17 NOTE — Telephone Encounter (Signed)
Pt came into office during [place] for pt wife, requested new RX to be printed for his Novilin 62ml BID 70ml vial with 6 refills per pt uses 2 vials a month. MD Birdie Riddle made aware verbally and signed Rx, given to pt

## 2011-10-24 DIAGNOSIS — L570 Actinic keratosis: Secondary | ICD-10-CM | POA: Diagnosis not present

## 2011-10-25 DIAGNOSIS — E1129 Type 2 diabetes mellitus with other diabetic kidney complication: Secondary | ICD-10-CM | POA: Diagnosis not present

## 2011-10-27 DIAGNOSIS — N186 End stage renal disease: Secondary | ICD-10-CM | POA: Diagnosis not present

## 2011-10-27 DIAGNOSIS — N2581 Secondary hyperparathyroidism of renal origin: Secondary | ICD-10-CM | POA: Diagnosis not present

## 2011-11-08 DIAGNOSIS — N186 End stage renal disease: Secondary | ICD-10-CM | POA: Diagnosis not present

## 2011-11-10 DIAGNOSIS — N186 End stage renal disease: Secondary | ICD-10-CM | POA: Diagnosis not present

## 2011-11-10 DIAGNOSIS — N2581 Secondary hyperparathyroidism of renal origin: Secondary | ICD-10-CM | POA: Diagnosis not present

## 2011-11-13 DIAGNOSIS — D509 Iron deficiency anemia, unspecified: Secondary | ICD-10-CM | POA: Diagnosis not present

## 2011-11-13 DIAGNOSIS — N2581 Secondary hyperparathyroidism of renal origin: Secondary | ICD-10-CM | POA: Diagnosis not present

## 2011-11-13 DIAGNOSIS — N186 End stage renal disease: Secondary | ICD-10-CM | POA: Diagnosis not present

## 2011-11-15 DIAGNOSIS — D509 Iron deficiency anemia, unspecified: Secondary | ICD-10-CM | POA: Diagnosis not present

## 2011-11-15 DIAGNOSIS — N2581 Secondary hyperparathyroidism of renal origin: Secondary | ICD-10-CM | POA: Diagnosis not present

## 2011-11-15 DIAGNOSIS — N186 End stage renal disease: Secondary | ICD-10-CM | POA: Diagnosis not present

## 2011-11-17 DIAGNOSIS — N2581 Secondary hyperparathyroidism of renal origin: Secondary | ICD-10-CM | POA: Diagnosis not present

## 2011-11-17 DIAGNOSIS — N186 End stage renal disease: Secondary | ICD-10-CM | POA: Diagnosis not present

## 2011-11-19 ENCOUNTER — Telehealth: Payer: Self-pay | Admitting: Family Medicine

## 2011-11-19 NOTE — Telephone Encounter (Signed)
I have not seen the form but due to the # of diabetic testing supply companies we get letters from, it was probably disregarded as our new policy is to only address these if pt asks for it

## 2011-11-19 NOTE — Telephone Encounter (Signed)
Caller: [name]/Patient; Patient Name: [name] [name]; PCP: Midge Minium.; Best Callback Phone Number: [phone]; pt is calling and states that [place] had sent Dr Virgil Benedict office a letter that needed filled out and [facility] has never received the requested form back for a prescription for diabetes test supplies and pt is calling to follow up;  OFFICE PLEASE REVIEW AND CALL PT REGARDING FURTHER INFORMATION ON THE FORM;  denies any symptoms for triage;

## 2011-11-19 NOTE — Telephone Encounter (Signed)
Pt states that he has copy of form and will drop by office on tomorrow to have [place] complete it.

## 2011-11-20 DIAGNOSIS — D509 Iron deficiency anemia, unspecified: Secondary | ICD-10-CM | POA: Diagnosis not present

## 2011-11-20 DIAGNOSIS — N186 End stage renal disease: Secondary | ICD-10-CM | POA: Diagnosis not present

## 2011-11-20 DIAGNOSIS — N2581 Secondary hyperparathyroidism of renal origin: Secondary | ICD-10-CM | POA: Diagnosis not present

## 2011-11-21 ENCOUNTER — Encounter: Payer: Self-pay | Admitting: Family Medicine

## 2011-11-22 DIAGNOSIS — N2581 Secondary hyperparathyroidism of renal origin: Secondary | ICD-10-CM | POA: Diagnosis not present

## 2011-11-22 DIAGNOSIS — D509 Iron deficiency anemia, unspecified: Secondary | ICD-10-CM | POA: Diagnosis not present

## 2011-11-22 DIAGNOSIS — N186 End stage renal disease: Secondary | ICD-10-CM | POA: Diagnosis not present

## 2011-11-23 NOTE — Telephone Encounter (Signed)
Form received and Faxed. Scan to chart

## 2011-11-24 DIAGNOSIS — D509 Iron deficiency anemia, unspecified: Secondary | ICD-10-CM | POA: Diagnosis not present

## 2011-11-24 DIAGNOSIS — N186 End stage renal disease: Secondary | ICD-10-CM | POA: Diagnosis not present

## 2011-11-24 DIAGNOSIS — N2581 Secondary hyperparathyroidism of renal origin: Secondary | ICD-10-CM | POA: Diagnosis not present

## 2011-11-27 DIAGNOSIS — N186 End stage renal disease: Secondary | ICD-10-CM | POA: Diagnosis not present

## 2011-11-27 DIAGNOSIS — D509 Iron deficiency anemia, unspecified: Secondary | ICD-10-CM | POA: Diagnosis not present

## 2011-11-27 DIAGNOSIS — N2581 Secondary hyperparathyroidism of renal origin: Secondary | ICD-10-CM | POA: Diagnosis not present

## 2011-11-29 DIAGNOSIS — N186 End stage renal disease: Secondary | ICD-10-CM | POA: Diagnosis not present

## 2011-11-29 DIAGNOSIS — N2581 Secondary hyperparathyroidism of renal origin: Secondary | ICD-10-CM | POA: Diagnosis not present

## 2011-11-29 DIAGNOSIS — D509 Iron deficiency anemia, unspecified: Secondary | ICD-10-CM | POA: Diagnosis not present

## 2011-12-01 DIAGNOSIS — N2581 Secondary hyperparathyroidism of renal origin: Secondary | ICD-10-CM | POA: Diagnosis not present

## 2011-12-01 DIAGNOSIS — N186 End stage renal disease: Secondary | ICD-10-CM | POA: Diagnosis not present

## 2011-12-01 DIAGNOSIS — D509 Iron deficiency anemia, unspecified: Secondary | ICD-10-CM | POA: Diagnosis not present

## 2011-12-04 DIAGNOSIS — N2581 Secondary hyperparathyroidism of renal origin: Secondary | ICD-10-CM | POA: Diagnosis not present

## 2011-12-04 DIAGNOSIS — D509 Iron deficiency anemia, unspecified: Secondary | ICD-10-CM | POA: Diagnosis not present

## 2011-12-04 DIAGNOSIS — N186 End stage renal disease: Secondary | ICD-10-CM | POA: Diagnosis not present

## 2011-12-06 DIAGNOSIS — D509 Iron deficiency anemia, unspecified: Secondary | ICD-10-CM | POA: Diagnosis not present

## 2011-12-06 DIAGNOSIS — N186 End stage renal disease: Secondary | ICD-10-CM | POA: Diagnosis not present

## 2011-12-06 DIAGNOSIS — N2581 Secondary hyperparathyroidism of renal origin: Secondary | ICD-10-CM | POA: Diagnosis not present

## 2011-12-08 DIAGNOSIS — N2581 Secondary hyperparathyroidism of renal origin: Secondary | ICD-10-CM | POA: Diagnosis not present

## 2011-12-08 DIAGNOSIS — N186 End stage renal disease: Secondary | ICD-10-CM | POA: Diagnosis not present

## 2011-12-08 DIAGNOSIS — D509 Iron deficiency anemia, unspecified: Secondary | ICD-10-CM | POA: Diagnosis not present

## 2011-12-11 DIAGNOSIS — N2581 Secondary hyperparathyroidism of renal origin: Secondary | ICD-10-CM | POA: Diagnosis not present

## 2011-12-11 DIAGNOSIS — N186 End stage renal disease: Secondary | ICD-10-CM | POA: Diagnosis not present

## 2011-12-15 DIAGNOSIS — N2581 Secondary hyperparathyroidism of renal origin: Secondary | ICD-10-CM | POA: Diagnosis not present

## 2011-12-15 DIAGNOSIS — N186 End stage renal disease: Secondary | ICD-10-CM | POA: Diagnosis not present

## 2011-12-17 ENCOUNTER — Encounter: Payer: Self-pay | Admitting: Family Medicine

## 2011-12-17 ENCOUNTER — Ambulatory Visit (INDEPENDENT_AMBULATORY_CARE_PROVIDER_SITE_OTHER): Payer: Medicare Other | Admitting: Family Medicine

## 2011-12-17 VITALS — BP 112/60 | HR 80 | Temp 98.0°F | Wt 183.4 lb

## 2011-12-17 DIAGNOSIS — E119 Type 2 diabetes mellitus without complications: Secondary | ICD-10-CM

## 2011-12-17 DIAGNOSIS — E785 Hyperlipidemia, unspecified: Secondary | ICD-10-CM

## 2011-12-17 DIAGNOSIS — Z79899 Other long term (current) drug therapy: Secondary | ICD-10-CM | POA: Diagnosis not present

## 2011-12-17 LAB — LIPID PANEL
Cholesterol: 136 mg/dL (ref 0–200)
HDL: 23.1 mg/dL — ABNORMAL LOW (ref >39.00)
Total CHOL/HDL Ratio: 6
Triglycerides: 241 mg/dL — ABNORMAL HIGH (ref 0.0–149.0)
VLDL: 48.2 mg/dL — ABNORMAL HIGH (ref 0.0–40.0)

## 2011-12-17 LAB — BASIC METABOLIC PANEL
BUN: 33 mg/dL — ABNORMAL HIGH (ref 6–23)
Calcium: 9.6 mg/dL (ref 8.4–10.5)
Creatinine, Ser: 3.4 mg/dL — ABNORMAL HIGH (ref 0.4–1.5)
GFR: 19.23 mL/min — ABNORMAL LOW (ref >60.00)
Potassium: 4.3 mEq/L (ref 3.5–5.1)

## 2011-12-17 LAB — HEPATIC FUNCTION PANEL
AST: 23 U/L (ref 0–37)
Total Bilirubin: 0.5 mg/dL (ref 0.3–1.2)

## 2011-12-17 MED ORDER — LEVOTHYROXINE SODIUM 125 MCG PO TABS: 125.0000 ug | ORAL_TABLET | Freq: Every day | ORAL | Status: DC

## 2011-12-17 MED ORDER — INSULIN NPH (HUMAN) (ISOPHANE) 100 UNIT/ML ~~LOC~~ SUSP: 40.0000 [IU] | Freq: Two times a day (BID) | SUBCUTANEOUS | Status: DC

## 2011-12-17 MED ORDER — TEMAZEPAM 15 MG PO CAPS: 15.0000 mg | ORAL_CAPSULE | Freq: Every evening | ORAL | Status: DC | PRN

## 2011-12-17 MED ORDER — CLOPIDOGREL BISULFATE 75 MG PO TABS: 75.0000 mg | ORAL_TABLET | Freq: Every day | ORAL | Status: DC

## 2011-12-17 MED ORDER — TERAZOSIN HCL 2 MG PO CAPS: 2.0000 mg | ORAL_CAPSULE | Freq: Every day | ORAL | Status: DC

## 2011-12-17 MED ORDER — ALLOPURINOL 100 MG PO TABS: 100.0000 mg | ORAL_TABLET | Freq: Every day | ORAL | Status: DC

## 2011-12-17 MED ORDER — ATORVASTATIN CALCIUM 10 MG PO TABS: 10.0000 mg | ORAL_TABLET | Freq: Every day | ORAL | Status: DC

## 2011-12-17 MED ORDER — RENA-VITE PO TABS: 1.0000 | ORAL_TABLET | Freq: Every day | ORAL | Status: DC

## 2011-12-17 MED ORDER — AMLODIPINE BESYLATE 5 MG PO TABS: 2.5000 mg | ORAL_TABLET | Freq: Every day | ORAL | Status: DC

## 2011-12-17 MED ORDER — CARVEDILOL 25 MG PO TABS: 25.0000 mg | ORAL_TABLET | Freq: Two times a day (BID) | ORAL | Status: DC

## 2011-12-17 MED ORDER — GLUCOSE BLOOD VI STRP: ORAL_STRIP | Status: DC

## 2011-12-17 MED ORDER — CALCIUM ACETATE 667 MG PO CAPS: 667.0000 mg | ORAL_CAPSULE | Freq: Three times a day (TID) | ORAL | Status: DC

## 2011-12-17 NOTE — Patient Instructions (Addendum)
Follow up in 3 months to recheck diabetes We'll notify you of your lab results and make any changes if needed Keep up the good work!  You look good! Call with any questions or concerns Happy Holidays!!!

## 2011-12-17 NOTE — Assessment & Plan Note (Signed)
Chronic problem.  Pt does good job of self adjusting insulin.  UTD on eye exam.  Check labs.  Adjust meds prn.  Reviewed supportive care and red flags that should prompt return.  Pt expressed understanding and is in agreement w/ plan.

## 2011-12-17 NOTE — Assessment & Plan Note (Signed)
Pt is switching Part D plans and needs all scripts printed and refilled.  This was done for pt today.

## 2011-12-17 NOTE — Assessment & Plan Note (Signed)
Chronic problem.  Pt's formulary is changing and will no longer cover vytorin.  Preferred med is lipitor.  Check labs.  Switch med.

## 2011-12-17 NOTE — Progress Notes (Signed)
  Subjective:    Patient ID: [name] [name], male    DOB: 03/06/1940, 71 y.o.   MRN: [id number]  HPI DM- chronic problem, on sliding scale of Novolin- will range from 0-40 units twice daily.  Has had a few lows but asymptomatic.  CBG 150 this AM, recently 77.  Has eye exam next month- having exams q6 months.  Still having occasional CP but 'they're not concerned w/ it (cards)'.  Will have associated SOB- 'it depends what i'm doing'.  Having pain in feet bilateral.    Hyperlipidemia- chronic problem, on 1/2 tab Vytorin but this is going off formulary.  According to new formulary, needs to take Atorvastatin.  No abd pain, N/V, myalgias.  Switching pharmacies to [facility] on [address] except for Insulin ([facility]) Review of Systems For ROS see HPI     Objective:   Physical Exam  Vitals reviewed. Constitutional: He is oriented to person, place, and time. He appears well-developed and well-nourished. No distress.  HENT:  Head: Normocephalic and atraumatic.  Eyes: Conjunctivae normal and EOM are normal. Pupils are equal, round, and reactive to light.  Neck: Normal range of motion. Neck supple. No thyromegaly present.  Cardiovascular: Normal rate, regular rhythm and intact distal pulses.   Murmur (II/VI SEM) heard. Pulmonary/Chest: Effort normal and breath sounds normal. No respiratory distress.  Abdominal: Soft. Bowel sounds are normal. He exhibits no distension. There is no tenderness. There is no rebound and no guarding.  Musculoskeletal: He exhibits no edema.  Lymphadenopathy:    He has no cervical adenopathy.  Neurological: He is alert and oriented to person, place, and time. No cranial nerve deficit.  Skin: Skin is warm and dry.  Psychiatric: He has a normal mood and affect. His behavior is normal.          Assessment & Plan:

## 2012-01-08 DIAGNOSIS — N186 End stage renal disease: Secondary | ICD-10-CM | POA: Diagnosis not present

## 2012-01-10 DIAGNOSIS — D509 Iron deficiency anemia, unspecified: Secondary | ICD-10-CM | POA: Diagnosis not present

## 2012-01-10 DIAGNOSIS — Z23 Encounter for immunization: Secondary | ICD-10-CM | POA: Diagnosis not present

## 2012-01-10 DIAGNOSIS — N186 End stage renal disease: Secondary | ICD-10-CM | POA: Diagnosis not present

## 2012-01-10 DIAGNOSIS — N2581 Secondary hyperparathyroidism of renal origin: Secondary | ICD-10-CM | POA: Diagnosis not present

## 2012-01-10 DIAGNOSIS — D631 Anemia in chronic kidney disease: Secondary | ICD-10-CM | POA: Diagnosis not present

## 2012-01-18 DIAGNOSIS — I447 Left bundle-branch block, unspecified: Secondary | ICD-10-CM | POA: Diagnosis not present

## 2012-01-18 DIAGNOSIS — N186 End stage renal disease: Secondary | ICD-10-CM | POA: Diagnosis not present

## 2012-01-18 DIAGNOSIS — I2510 Atherosclerotic heart disease of native coronary artery without angina pectoris: Secondary | ICD-10-CM | POA: Diagnosis not present

## 2012-01-18 DIAGNOSIS — Z951 Presence of aortocoronary bypass graft: Secondary | ICD-10-CM | POA: Diagnosis not present

## 2012-01-21 ENCOUNTER — Other Ambulatory Visit (HOSPITAL_COMMUNITY): Payer: Self-pay | Admitting: Cardiovascular Disease

## 2012-01-21 DIAGNOSIS — I351 Nonrheumatic aortic (valve) insufficiency: Secondary | ICD-10-CM

## 2012-01-21 DIAGNOSIS — I519 Heart disease, unspecified: Secondary | ICD-10-CM

## 2012-01-21 DIAGNOSIS — I2510 Atherosclerotic heart disease of native coronary artery without angina pectoris: Secondary | ICD-10-CM

## 2012-01-23 ENCOUNTER — Ambulatory Visit (INDEPENDENT_AMBULATORY_CARE_PROVIDER_SITE_OTHER): Payer: Medicare Other | Admitting: Ophthalmology

## 2012-01-23 DIAGNOSIS — H35039 Hypertensive retinopathy, unspecified eye: Secondary | ICD-10-CM

## 2012-01-23 DIAGNOSIS — E1139 Type 2 diabetes mellitus with other diabetic ophthalmic complication: Secondary | ICD-10-CM

## 2012-01-23 DIAGNOSIS — H43819 Vitreous degeneration, unspecified eye: Secondary | ICD-10-CM

## 2012-01-23 DIAGNOSIS — I10 Essential (primary) hypertension: Secondary | ICD-10-CM

## 2012-01-23 DIAGNOSIS — E11319 Type 2 diabetes mellitus with unspecified diabetic retinopathy without macular edema: Secondary | ICD-10-CM

## 2012-01-23 DIAGNOSIS — R42 Dizziness and giddiness: Secondary | ICD-10-CM | POA: Diagnosis not present

## 2012-01-25 ENCOUNTER — Ambulatory Visit (HOSPITAL_COMMUNITY)
Admission: RE | Admit: 2012-01-25 | Discharge: 2012-01-25 | Disposition: A | Discharge status: Home / Self Care | Payer: Medicare Other | Source: Ambulatory Visit | Attending: Cardiovascular Disease | Admitting: Cardiovascular Disease

## 2012-01-25 DIAGNOSIS — I519 Heart disease, unspecified: Secondary | ICD-10-CM | POA: Diagnosis not present

## 2012-01-25 DIAGNOSIS — I359 Nonrheumatic aortic valve disorder, unspecified: Secondary | ICD-10-CM | POA: Insufficient documentation

## 2012-01-25 DIAGNOSIS — I509 Heart failure, unspecified: Secondary | ICD-10-CM | POA: Diagnosis not present

## 2012-01-25 DIAGNOSIS — I2510 Atherosclerotic heart disease of native coronary artery without angina pectoris: Secondary | ICD-10-CM | POA: Insufficient documentation

## 2012-01-25 DIAGNOSIS — I351 Nonrheumatic aortic (valve) insufficiency: Secondary | ICD-10-CM

## 2012-01-25 HISTORY — PX: DOPPLER ECHOCARDIOGRAPHY: SHX263

## 2012-01-25 NOTE — Progress Notes (Signed)
[facility]   2D echo completed 01/25/2012.   Jamison Neighbor, RDCS

## 2012-01-31 DIAGNOSIS — E1129 Type 2 diabetes mellitus with other diabetic kidney complication: Secondary | ICD-10-CM | POA: Diagnosis not present

## 2012-01-31 DIAGNOSIS — N186 End stage renal disease: Secondary | ICD-10-CM | POA: Diagnosis not present

## 2012-02-08 DIAGNOSIS — N186 End stage renal disease: Secondary | ICD-10-CM | POA: Diagnosis not present

## 2012-02-09 DIAGNOSIS — D509 Iron deficiency anemia, unspecified: Secondary | ICD-10-CM | POA: Diagnosis not present

## 2012-02-09 DIAGNOSIS — N2581 Secondary hyperparathyroidism of renal origin: Secondary | ICD-10-CM | POA: Diagnosis not present

## 2012-02-09 DIAGNOSIS — D631 Anemia in chronic kidney disease: Secondary | ICD-10-CM | POA: Diagnosis not present

## 2012-02-09 DIAGNOSIS — N186 End stage renal disease: Secondary | ICD-10-CM | POA: Diagnosis not present

## 2012-02-20 ENCOUNTER — Other Ambulatory Visit (HOSPITAL_COMMUNITY): Payer: Self-pay | Admitting: Nephrology

## 2012-02-20 DIAGNOSIS — N186 End stage renal disease: Secondary | ICD-10-CM

## 2012-02-23 ENCOUNTER — Other Ambulatory Visit: Payer: Self-pay

## 2012-02-25 ENCOUNTER — Ambulatory Visit (HOSPITAL_COMMUNITY)
Admission: RE | Admit: 2012-02-25 | Discharge: 2012-02-25 | Disposition: A | Discharge status: Home / Self Care | Payer: Medicare Other | Source: Ambulatory Visit | Attending: Nephrology | Admitting: Nephrology

## 2012-02-25 ENCOUNTER — Other Ambulatory Visit (HOSPITAL_COMMUNITY): Payer: Self-pay | Admitting: Nephrology

## 2012-02-25 DIAGNOSIS — N186 End stage renal disease: Secondary | ICD-10-CM | POA: Insufficient documentation

## 2012-02-25 DIAGNOSIS — T82898A Other specified complication of vascular prosthetic devices, implants and grafts, initial encounter: Secondary | ICD-10-CM | POA: Insufficient documentation

## 2012-02-25 DIAGNOSIS — I871 Compression of vein: Secondary | ICD-10-CM | POA: Insufficient documentation

## 2012-02-25 DIAGNOSIS — Y832 Surgical operation with anastomosis, bypass or graft as the cause of abnormal reaction of the patient, or of later complication, without mention of misadventure at the time of the procedure: Secondary | ICD-10-CM | POA: Insufficient documentation

## 2012-02-25 DIAGNOSIS — Z992 Dependence on renal dialysis: Secondary | ICD-10-CM | POA: Diagnosis not present

## 2012-02-25 MED ORDER — IOHEXOL 300 MG/ML  SOLN
100.0000 mL | Freq: Once | INTRAMUSCULAR | Status: AC | PRN
  Administered 2012-02-25: 50 mL via INTRAVENOUS

## 2012-02-25 NOTE — Procedures (Signed)
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

## 2012-02-26 ENCOUNTER — Telehealth (HOSPITAL_COMMUNITY): Payer: Self-pay | Admitting: *Deleted

## 2012-02-27 DIAGNOSIS — M7730 Calcaneal spur, unspecified foot: Secondary | ICD-10-CM | POA: Diagnosis not present

## 2012-02-28 ENCOUNTER — Other Ambulatory Visit: Payer: Self-pay | Admitting: Family Medicine

## 2012-02-29 MED ORDER — NITROGLYCERIN 0.4 MG SL SUBL: 0.4000 mg | SUBLINGUAL_TABLET | SUBLINGUAL | Status: DC | PRN

## 2012-03-07 DIAGNOSIS — N186 End stage renal disease: Secondary | ICD-10-CM | POA: Diagnosis not present

## 2012-03-08 DIAGNOSIS — D631 Anemia in chronic kidney disease: Secondary | ICD-10-CM | POA: Diagnosis not present

## 2012-03-08 DIAGNOSIS — D509 Iron deficiency anemia, unspecified: Secondary | ICD-10-CM | POA: Diagnosis not present

## 2012-03-08 DIAGNOSIS — N2581 Secondary hyperparathyroidism of renal origin: Secondary | ICD-10-CM | POA: Diagnosis not present

## 2012-03-08 DIAGNOSIS — N186 End stage renal disease: Secondary | ICD-10-CM | POA: Diagnosis not present

## 2012-03-17 ENCOUNTER — Encounter: Payer: Self-pay | Admitting: Family Medicine

## 2012-03-17 ENCOUNTER — Ambulatory Visit (INDEPENDENT_AMBULATORY_CARE_PROVIDER_SITE_OTHER): Payer: Medicare Other | Admitting: Family Medicine

## 2012-03-17 ENCOUNTER — Encounter: Payer: Self-pay | Admitting: *Deleted

## 2012-03-17 VITALS — BP 138/60 | HR 71 | Temp 98.3°F | Ht 66.0 in | Wt 184.2 lb

## 2012-03-17 DIAGNOSIS — E119 Type 2 diabetes mellitus without complications: Secondary | ICD-10-CM

## 2012-03-17 DIAGNOSIS — R0989 Other specified symptoms and signs involving the circulatory and respiratory systems: Secondary | ICD-10-CM

## 2012-03-17 LAB — HEMOGLOBIN A1C: Hgb A1c MFr Bld: 5.5 % (ref 4.6–6.5)

## 2012-03-17 LAB — BASIC METABOLIC PANEL
BUN: 38 mg/dL — ABNORMAL HIGH (ref 6–23)
Chloride: 101 mEq/L (ref 96–112)
Creatinine, Ser: 3.4 mg/dL — ABNORMAL HIGH (ref 0.4–1.5)
GFR: 19.28 mL/min — ABNORMAL LOW (ref >60.00)

## 2012-03-17 MED ORDER — CLOPIDOGREL BISULFATE 75 MG PO TABS: 75.0000 mg | ORAL_TABLET | Freq: Every day | ORAL | Status: DC

## 2012-03-17 NOTE — Patient Instructions (Addendum)
Schedule your complete physical in 3 months Please ask about your fistula at HD tomorrow- if you don't get anywhere, let me know and we'll call vascular We'll notify you of your lab results and make any changes if needed Call with any questions or concerns Hang in there!!!

## 2012-03-17 NOTE — Progress Notes (Signed)
  Subjective:    Patient ID: [name] [name], male    DOB: 03-13-1940, 72 y.o.   MRN: [id number]  HPI DM- chronic problem, spent last 3 days w/out power but insulin is still good.  CBG 68 this AM.  CBGs have been running 'on the low side but i haven't had a problem w/ it yet'.  UTD on eye exam.  Denies symptomatic lows.  + CP- following w/ Dr Rollene Fare and 'they're aware'.  No SOB, HAs, visual changes, edema.  Fistulagram done 2 weeks ago w/ balloon stenting, has had painful, cold hand since.  This AM hand was completely numb.  Has not been back in contact w/ vascular.  Has HD tomorrow.  Told Nephrologist last week.  Foot pain- currently seeing Podiatry ([facility])  Recently completed forms for diabetic shoes.  Has 2 large heel spurs.  Has f/u on Wednesday.     Review of Systems For ROS see HPI     Objective:   Physical Exam  Vitals reviewed. Constitutional: He is oriented to person, place, and time. He appears well-developed and well-nourished. No distress.  HENT:  Head: Normocephalic and atraumatic.  Neck: Normal range of motion. Neck supple. No thyromegaly present.  Cardiovascular: Normal rate.   Murmur (II-III/VI SEM) heard. Decreased L radial pulse Normal R radial pulse  Pulmonary/Chest: Effort normal and breath sounds normal. No respiratory distress. He has no wheezes. He has no rales.  Musculoskeletal: He exhibits no edema.  Neurological: He is alert and oriented to person, place, and time.  Skin: Skin is warm and dry. There is pallor.  Psychiatric: He has a normal mood and affect. His behavior is normal. Thought content normal.          Assessment & Plan:

## 2012-03-18 NOTE — Assessment & Plan Note (Signed)
New.  Pt reports his L hand has been cold, painful since fistulagram.  Has mentioned this at HD but they 'didn't seem concerned'.  Encouraged pt to mention this again and if they don't get him in touch w/ vascular, pt to call so we can refer immediately.  Pt expressed understanding and is in agreement w/ plan.

## 2012-03-18 NOTE — Assessment & Plan Note (Signed)
Chronic problem.  CBGs well controlled- maybe too well controlled.  Pt self titrates insulin based on CBGs.  Check labs.  Adjust meds prn.  Reviewed sxs of hypoglycemia.  UTD on eye exam, foot exam (following w/ podiatry)

## 2012-03-29 DIAGNOSIS — N2581 Secondary hyperparathyroidism of renal origin: Secondary | ICD-10-CM | POA: Diagnosis not present

## 2012-03-29 DIAGNOSIS — N186 End stage renal disease: Secondary | ICD-10-CM | POA: Diagnosis not present

## 2012-04-07 DIAGNOSIS — N186 End stage renal disease: Secondary | ICD-10-CM | POA: Diagnosis not present

## 2012-04-08 DIAGNOSIS — N2581 Secondary hyperparathyroidism of renal origin: Secondary | ICD-10-CM | POA: Diagnosis not present

## 2012-04-08 DIAGNOSIS — N186 End stage renal disease: Secondary | ICD-10-CM | POA: Diagnosis not present

## 2012-04-19 DIAGNOSIS — N186 End stage renal disease: Secondary | ICD-10-CM | POA: Diagnosis not present

## 2012-04-19 DIAGNOSIS — N2581 Secondary hyperparathyroidism of renal origin: Secondary | ICD-10-CM | POA: Diagnosis not present

## 2012-04-28 ENCOUNTER — Encounter: Payer: Self-pay | Admitting: Family Medicine

## 2012-05-01 DIAGNOSIS — E1129 Type 2 diabetes mellitus with other diabetic kidney complication: Secondary | ICD-10-CM | POA: Diagnosis not present

## 2012-05-03 DIAGNOSIS — N186 End stage renal disease: Secondary | ICD-10-CM | POA: Diagnosis not present

## 2012-05-03 DIAGNOSIS — N2581 Secondary hyperparathyroidism of renal origin: Secondary | ICD-10-CM | POA: Diagnosis not present

## 2012-05-07 DIAGNOSIS — N186 End stage renal disease: Secondary | ICD-10-CM | POA: Diagnosis not present

## 2012-05-08 DIAGNOSIS — Z23 Encounter for immunization: Secondary | ICD-10-CM | POA: Diagnosis not present

## 2012-05-08 DIAGNOSIS — N186 End stage renal disease: Secondary | ICD-10-CM | POA: Diagnosis not present

## 2012-05-08 DIAGNOSIS — N2581 Secondary hyperparathyroidism of renal origin: Secondary | ICD-10-CM | POA: Diagnosis not present

## 2012-05-12 DIAGNOSIS — I2510 Atherosclerotic heart disease of native coronary artery without angina pectoris: Secondary | ICD-10-CM | POA: Diagnosis not present

## 2012-05-12 DIAGNOSIS — N186 End stage renal disease: Secondary | ICD-10-CM | POA: Diagnosis not present

## 2012-05-12 DIAGNOSIS — I447 Left bundle-branch block, unspecified: Secondary | ICD-10-CM | POA: Diagnosis not present

## 2012-05-12 DIAGNOSIS — I10 Essential (primary) hypertension: Secondary | ICD-10-CM | POA: Diagnosis not present

## 2012-05-13 ENCOUNTER — Other Ambulatory Visit (HOSPITAL_COMMUNITY): Payer: Self-pay | Admitting: Cardiovascular Disease

## 2012-05-13 DIAGNOSIS — I672 Cerebral atherosclerosis: Secondary | ICD-10-CM

## 2012-05-19 DIAGNOSIS — E782 Mixed hyperlipidemia: Secondary | ICD-10-CM | POA: Diagnosis not present

## 2012-05-23 ENCOUNTER — Ambulatory Visit (HOSPITAL_COMMUNITY)
Admission: RE | Admit: 2012-05-23 | Discharge: 2012-05-23 | Disposition: A | Discharge status: Home / Self Care | Payer: Medicare Other | Source: Ambulatory Visit | Attending: Cardiovascular Disease | Admitting: Cardiovascular Disease

## 2012-05-23 DIAGNOSIS — I672 Cerebral atherosclerosis: Secondary | ICD-10-CM

## 2012-05-23 DIAGNOSIS — I708 Atherosclerosis of other arteries: Secondary | ICD-10-CM | POA: Diagnosis not present

## 2012-05-23 DIAGNOSIS — I6529 Occlusion and stenosis of unspecified carotid artery: Secondary | ICD-10-CM | POA: Diagnosis not present

## 2012-05-23 HISTORY — PX: OTHER SURGICAL HISTORY: SHX169

## 2012-05-23 NOTE — Progress Notes (Signed)
Carotid Duplex Completed. Cecil Vandyke D  

## 2012-05-24 DIAGNOSIS — N186 End stage renal disease: Secondary | ICD-10-CM | POA: Diagnosis not present

## 2012-05-24 DIAGNOSIS — N2581 Secondary hyperparathyroidism of renal origin: Secondary | ICD-10-CM | POA: Diagnosis not present

## 2012-05-30 ENCOUNTER — Other Ambulatory Visit: Payer: Self-pay | Admitting: Dermatology

## 2012-05-30 DIAGNOSIS — L821 Other seborrheic keratosis: Secondary | ICD-10-CM | POA: Diagnosis not present

## 2012-05-30 DIAGNOSIS — L988 Other specified disorders of the skin and subcutaneous tissue: Secondary | ICD-10-CM | POA: Diagnosis not present

## 2012-05-30 DIAGNOSIS — D485 Neoplasm of uncertain behavior of skin: Secondary | ICD-10-CM | POA: Diagnosis not present

## 2012-05-30 DIAGNOSIS — D239 Other benign neoplasm of skin, unspecified: Secondary | ICD-10-CM | POA: Diagnosis not present

## 2012-05-30 DIAGNOSIS — L570 Actinic keratosis: Secondary | ICD-10-CM | POA: Diagnosis not present

## 2012-06-07 DIAGNOSIS — N186 End stage renal disease: Secondary | ICD-10-CM | POA: Diagnosis not present

## 2012-06-10 DIAGNOSIS — N186 End stage renal disease: Secondary | ICD-10-CM | POA: Diagnosis not present

## 2012-06-10 DIAGNOSIS — N2581 Secondary hyperparathyroidism of renal origin: Secondary | ICD-10-CM | POA: Diagnosis not present

## 2012-06-17 ENCOUNTER — Other Ambulatory Visit: Payer: Self-pay | Admitting: Family Medicine

## 2012-06-17 ENCOUNTER — Other Ambulatory Visit (HOSPITAL_COMMUNITY): Payer: Self-pay | Admitting: Nephrology

## 2012-06-17 DIAGNOSIS — N186 End stage renal disease: Secondary | ICD-10-CM

## 2012-06-18 NOTE — Telephone Encounter (Signed)
Last refill:04-22-12 Last OV:03-17-12-has appt on:06-20-12(CPE) Please advise.//AB/CMA

## 2012-06-18 NOTE — Telephone Encounter (Signed)
Medication faxed

## 2012-06-20 ENCOUNTER — Encounter: Payer: Self-pay | Admitting: Family Medicine

## 2012-06-20 ENCOUNTER — Ambulatory Visit (INDEPENDENT_AMBULATORY_CARE_PROVIDER_SITE_OTHER): Payer: Medicare Other | Admitting: Family Medicine

## 2012-06-20 VITALS — BP 132/60 | HR 67 | Temp 98.3°F | Ht 66.0 in | Wt 175.8 lb

## 2012-06-20 DIAGNOSIS — E119 Type 2 diabetes mellitus without complications: Secondary | ICD-10-CM | POA: Diagnosis not present

## 2012-06-20 DIAGNOSIS — E785 Hyperlipidemia, unspecified: Secondary | ICD-10-CM | POA: Diagnosis not present

## 2012-06-20 DIAGNOSIS — I10 Essential (primary) hypertension: Secondary | ICD-10-CM

## 2012-06-20 DIAGNOSIS — Z0000 Encounter for general adult medical examination without abnormal findings: Secondary | ICD-10-CM | POA: Diagnosis not present

## 2012-06-20 DIAGNOSIS — E039 Hypothyroidism, unspecified: Secondary | ICD-10-CM

## 2012-06-20 LAB — HEPATIC FUNCTION PANEL
Alkaline Phosphatase: 78 U/L (ref 39–117)
Bilirubin, Direct: 0 mg/dL (ref 0.0–0.3)
Total Bilirubin: 0.9 mg/dL (ref 0.3–1.2)

## 2012-06-20 LAB — BASIC METABOLIC PANEL
CO2: 28 mEq/L (ref 19–32)
Calcium: 10.1 mg/dL (ref 8.4–10.5)
Creatinine, Ser: 3.6 mg/dL — ABNORMAL HIGH (ref 0.4–1.5)
GFR: 17.97 mL/min — ABNORMAL LOW (ref >60.00)
Sodium: 142 mEq/L (ref 135–145)

## 2012-06-20 LAB — LIPID PANEL
Cholesterol: 123 mg/dL (ref 0–200)
HDL: 26.7 mg/dL — ABNORMAL LOW (ref >39.00)
Total CHOL/HDL Ratio: 5
Triglycerides: 245 mg/dL — ABNORMAL HIGH (ref 0.0–149.0)
VLDL: 49 mg/dL — ABNORMAL HIGH (ref 0.0–40.0)

## 2012-06-20 LAB — CBC WITH DIFFERENTIAL/PLATELET
Basophils Absolute: 0.1 10*3/uL (ref 0.0–0.1)
Eosinophils Absolute: 0.3 10*3/uL (ref 0.0–0.7)
HCT: 34.4 % — ABNORMAL LOW (ref 39.0–52.0)
Lymphs Abs: 1.1 10*3/uL (ref 0.7–4.0)
MCHC: 34 g/dL (ref 30.0–36.0)
Monocytes Relative: 8.3 % (ref 3.0–12.0)
Neutro Abs: 7.8 10*3/uL — ABNORMAL HIGH (ref 1.4–7.7)
Platelets: 266 10*3/uL (ref 150.0–400.0)
RDW: 14.1 % (ref 11.5–14.6)

## 2012-06-20 LAB — HEMOGLOBIN A1C: Hgb A1c MFr Bld: 6.1 % (ref 4.6–6.5)

## 2012-06-20 NOTE — Assessment & Plan Note (Signed)
Chronic problem.  Adequate control.  Tends to run hypotensive after HD.  No med changes at this time.  Will follow.

## 2012-06-20 NOTE — Progress Notes (Signed)
  Subjective:    Patient ID: [name] [name], male    DOB: 24-Nov-1940, 72 y.o.   MRN: [id number]  HPI Here today for CPE.  Risk Factors: DM- chronic problem, CBGs 90-160s.  On SSI.  UTD on eye exam.  + neuropathy- advanced according to podiatry.   HTN- chronic problem, problem on Coreg and Hytrin.  Stopped norvasc due to hypotension w/ HD. Occasional CP- cards aware.  No SOB, HAs, visual changes.  + edema until HD.  Hyperlipidemia- chronic problem, on Lipitor.  No abd pain, N/V. Hypothyroid- chronic problem, on synthroid. Physical Activity: limited exercise due to multiple medical comorbidities Fall Risk: moderate risk due to neuropathy Depression: denies current sxs Hearing: normal to conversational tones, mildly decreased to whispered voice ADL's: independent Cognitive: normal linear thought process, memory and attention intact Home Safety: safe at home, lives w/ wife Height, Weight, BMI, Visual Acuity: see vitals, vision corrected to 20/20 w/ glasses Counseling: UTD on colonoscopy. Labs Ordered: See A&P Care Plan: See A&P    Review of Systems Patient reports no vision/hearing changes, anorexia, fever ,adenopathy, persistant/recurrent hoarseness, swallowing issues, chest pain, palpitations, edema, persistant/recurrent cough, hemoptysis, dyspnea (rest,exertional, paroxysmal nocturnal), gastrointestinal  bleeding (melena, rectal bleeding), abdominal pain, excessive heart burn, GU symptoms (dysuria, hematuria, voiding/incontinence issues) syncope, focal weakness, memory loss, skin/hair/nail changes, depression, anxiety, abnormal bruising/bleeding, musculoskeletal symptoms/signs.     Objective:   Physical Exam BP 132/60  Pulse 67  Temp(Src) 98.3 F (36.8 C) (Oral)  Ht 5\' 6"  (1.676 m)  Wt 175 lb 12.8 oz (79.742 kg)  BMI 28.39 kg/m2  SpO2 96%  General Appearance:    Alert, cooperative, no distress, appears stated age  Head:    Normocephalic, without obvious abnormality, atraumatic   Eyes:    PERRL, conjunctiva/corneas clear, EOM's intact, fundi    benign, both eyes       Ears:    Normal TM's and external ear canals, both ears  Nose:   Nares normal, septum midline, mucosa normal, no drainage   or sinus tenderness  Throat:   Lips, mucosa, and tongue normal; teeth and gums normal  Neck:   Supple, symmetrical, trachea midline, no adenopathy;       thyroid:  No enlargement/tenderness/nodules  Back:     Symmetric, no curvature, ROM normal, no CVA tenderness  Lungs:     Clear to auscultation bilaterally, respirations unlabored  Chest wall:    No tenderness or deformity  Heart:    Regular rate and rhythm, S1 and S2 normal, II/VI SEM, no rub   or gallop  Abdomen:     Soft, non-tender, bowel sounds active all four quadrants,    no masses, no organomegaly  Genitalia:    Normal male without lesion, discharge or tenderness  Rectal:    Normal tone, normal prostate, no masses or tenderness  Extremities:   Extremities normal, atraumatic, no cyanosis or edema  Pulses:   2+ and symmetric all extremities  Skin:   Skin color, texture, turgor normal, no rashes or lesions  Lymph nodes:   Cervical, supraclavicular, and axillary nodes normal  Neurologic:   CNII-XII intact. Normal strength, sensation and reflexes      throughout          Assessment & Plan:

## 2012-06-20 NOTE — Assessment & Plan Note (Signed)
Chronic problem.  Persistent neuropathy.  Following sugars closely, adjusting insulin based on readings.  UTD on eye exam.  Check labs.  Adjust meds prn

## 2012-06-20 NOTE — Patient Instructions (Addendum)
Follow up in 3-4 months to recheck diabetes We'll notify you of your lab results and make any changes if needed Call with any questions or concerns Hang in there!

## 2012-06-20 NOTE — Assessment & Plan Note (Signed)
Pt's PE WNL w/ exception of known abnormalities (SEM, LUE AV fistula).  UTD on colonoscopy.  Check labs.  Anticipatory guidance provided.

## 2012-06-20 NOTE — Assessment & Plan Note (Signed)
Chronic problem.  Tolerating statin w/out difficulty.  Check labs.  Adjust meds prn  

## 2012-06-20 NOTE — Assessment & Plan Note (Signed)
Chronic problem.  Asymptomatic.  Check labs.  Adjust meds prn  

## 2012-06-21 DIAGNOSIS — N2581 Secondary hyperparathyroidism of renal origin: Secondary | ICD-10-CM | POA: Diagnosis not present

## 2012-06-21 DIAGNOSIS — N186 End stage renal disease: Secondary | ICD-10-CM | POA: Diagnosis not present

## 2012-06-23 ENCOUNTER — Other Ambulatory Visit (HOSPITAL_COMMUNITY): Payer: Medicare Other

## 2012-06-24 DIAGNOSIS — N186 End stage renal disease: Secondary | ICD-10-CM | POA: Diagnosis not present

## 2012-06-27 ENCOUNTER — Ambulatory Visit (HOSPITAL_COMMUNITY)
Admission: RE | Admit: 2012-06-27 | Discharge: 2012-06-27 | Disposition: A | Discharge status: Home / Self Care | Payer: Medicare Other | Source: Ambulatory Visit | Attending: Nephrology | Admitting: Nephrology

## 2012-06-27 DIAGNOSIS — I868 Varicose veins of other specified sites: Secondary | ICD-10-CM | POA: Diagnosis not present

## 2012-06-27 DIAGNOSIS — N186 End stage renal disease: Secondary | ICD-10-CM

## 2012-06-27 DIAGNOSIS — Y832 Surgical operation with anastomosis, bypass or graft as the cause of abnormal reaction of the patient, or of later complication, without mention of misadventure at the time of the procedure: Secondary | ICD-10-CM | POA: Insufficient documentation

## 2012-06-27 DIAGNOSIS — T82898A Other specified complication of vascular prosthetic devices, implants and grafts, initial encounter: Secondary | ICD-10-CM | POA: Insufficient documentation

## 2012-06-27 MED ORDER — IOHEXOL 300 MG/ML  SOLN
100.0000 mL | Freq: Once | INTRAMUSCULAR | Status: AC | PRN
  Administered 2012-06-27: 12 mL via INTRAVENOUS

## 2012-07-07 DIAGNOSIS — N186 End stage renal disease: Secondary | ICD-10-CM | POA: Diagnosis not present

## 2012-07-08 DIAGNOSIS — Z23 Encounter for immunization: Secondary | ICD-10-CM | POA: Diagnosis not present

## 2012-07-08 DIAGNOSIS — N186 End stage renal disease: Secondary | ICD-10-CM | POA: Diagnosis not present

## 2012-07-08 DIAGNOSIS — N2581 Secondary hyperparathyroidism of renal origin: Secondary | ICD-10-CM | POA: Diagnosis not present

## 2012-07-23 ENCOUNTER — Ambulatory Visit (INDEPENDENT_AMBULATORY_CARE_PROVIDER_SITE_OTHER): Payer: Medicare Other | Admitting: Ophthalmology

## 2012-07-23 DIAGNOSIS — E11319 Type 2 diabetes mellitus with unspecified diabetic retinopathy without macular edema: Secondary | ICD-10-CM

## 2012-07-23 DIAGNOSIS — I10 Essential (primary) hypertension: Secondary | ICD-10-CM | POA: Diagnosis not present

## 2012-07-23 DIAGNOSIS — H35039 Hypertensive retinopathy, unspecified eye: Secondary | ICD-10-CM

## 2012-07-23 DIAGNOSIS — H43819 Vitreous degeneration, unspecified eye: Secondary | ICD-10-CM

## 2012-07-23 DIAGNOSIS — E1165 Type 2 diabetes mellitus with hyperglycemia: Secondary | ICD-10-CM | POA: Diagnosis not present

## 2012-07-23 DIAGNOSIS — E1139 Type 2 diabetes mellitus with other diabetic ophthalmic complication: Secondary | ICD-10-CM | POA: Diagnosis not present

## 2012-07-26 DIAGNOSIS — N186 End stage renal disease: Secondary | ICD-10-CM | POA: Diagnosis not present

## 2012-07-26 DIAGNOSIS — N2581 Secondary hyperparathyroidism of renal origin: Secondary | ICD-10-CM | POA: Diagnosis not present

## 2012-07-29 DIAGNOSIS — N2581 Secondary hyperparathyroidism of renal origin: Secondary | ICD-10-CM | POA: Diagnosis not present

## 2012-07-29 DIAGNOSIS — N186 End stage renal disease: Secondary | ICD-10-CM | POA: Diagnosis not present

## 2012-07-31 DIAGNOSIS — N2581 Secondary hyperparathyroidism of renal origin: Secondary | ICD-10-CM | POA: Diagnosis not present

## 2012-07-31 DIAGNOSIS — N186 End stage renal disease: Secondary | ICD-10-CM | POA: Diagnosis not present

## 2012-08-02 DIAGNOSIS — N186 End stage renal disease: Secondary | ICD-10-CM | POA: Diagnosis not present

## 2012-08-02 DIAGNOSIS — N2581 Secondary hyperparathyroidism of renal origin: Secondary | ICD-10-CM | POA: Diagnosis not present

## 2012-08-05 DIAGNOSIS — E1129 Type 2 diabetes mellitus with other diabetic kidney complication: Secondary | ICD-10-CM | POA: Diagnosis not present

## 2012-08-07 DIAGNOSIS — N186 End stage renal disease: Secondary | ICD-10-CM | POA: Diagnosis not present

## 2012-08-09 DIAGNOSIS — D631 Anemia in chronic kidney disease: Secondary | ICD-10-CM | POA: Diagnosis not present

## 2012-08-09 DIAGNOSIS — N2581 Secondary hyperparathyroidism of renal origin: Secondary | ICD-10-CM | POA: Diagnosis not present

## 2012-08-09 DIAGNOSIS — N186 End stage renal disease: Secondary | ICD-10-CM | POA: Diagnosis not present

## 2012-08-12 DIAGNOSIS — N2581 Secondary hyperparathyroidism of renal origin: Secondary | ICD-10-CM | POA: Diagnosis not present

## 2012-08-12 DIAGNOSIS — N186 End stage renal disease: Secondary | ICD-10-CM | POA: Diagnosis not present

## 2012-08-12 DIAGNOSIS — D631 Anemia in chronic kidney disease: Secondary | ICD-10-CM | POA: Diagnosis not present

## 2012-08-14 DIAGNOSIS — N039 Chronic nephritic syndrome with unspecified morphologic changes: Secondary | ICD-10-CM | POA: Diagnosis not present

## 2012-08-14 DIAGNOSIS — D631 Anemia in chronic kidney disease: Secondary | ICD-10-CM | POA: Diagnosis not present

## 2012-08-14 DIAGNOSIS — N186 End stage renal disease: Secondary | ICD-10-CM | POA: Diagnosis not present

## 2012-08-14 DIAGNOSIS — N2581 Secondary hyperparathyroidism of renal origin: Secondary | ICD-10-CM | POA: Diagnosis not present

## 2012-08-16 DIAGNOSIS — N186 End stage renal disease: Secondary | ICD-10-CM | POA: Diagnosis not present

## 2012-08-16 DIAGNOSIS — N039 Chronic nephritic syndrome with unspecified morphologic changes: Secondary | ICD-10-CM | POA: Diagnosis not present

## 2012-08-16 DIAGNOSIS — N2581 Secondary hyperparathyroidism of renal origin: Secondary | ICD-10-CM | POA: Diagnosis not present

## 2012-08-19 DIAGNOSIS — D631 Anemia in chronic kidney disease: Secondary | ICD-10-CM | POA: Diagnosis not present

## 2012-08-19 DIAGNOSIS — N2581 Secondary hyperparathyroidism of renal origin: Secondary | ICD-10-CM | POA: Diagnosis not present

## 2012-08-19 DIAGNOSIS — N186 End stage renal disease: Secondary | ICD-10-CM | POA: Diagnosis not present

## 2012-08-21 DIAGNOSIS — D631 Anemia in chronic kidney disease: Secondary | ICD-10-CM | POA: Diagnosis not present

## 2012-08-21 DIAGNOSIS — N2581 Secondary hyperparathyroidism of renal origin: Secondary | ICD-10-CM | POA: Diagnosis not present

## 2012-08-21 DIAGNOSIS — N186 End stage renal disease: Secondary | ICD-10-CM | POA: Diagnosis not present

## 2012-08-23 DIAGNOSIS — D631 Anemia in chronic kidney disease: Secondary | ICD-10-CM | POA: Diagnosis not present

## 2012-08-23 DIAGNOSIS — N2581 Secondary hyperparathyroidism of renal origin: Secondary | ICD-10-CM | POA: Diagnosis not present

## 2012-08-23 DIAGNOSIS — N186 End stage renal disease: Secondary | ICD-10-CM | POA: Diagnosis not present

## 2012-08-26 DIAGNOSIS — D631 Anemia in chronic kidney disease: Secondary | ICD-10-CM | POA: Diagnosis not present

## 2012-08-26 DIAGNOSIS — N2581 Secondary hyperparathyroidism of renal origin: Secondary | ICD-10-CM | POA: Diagnosis not present

## 2012-08-26 DIAGNOSIS — N186 End stage renal disease: Secondary | ICD-10-CM | POA: Diagnosis not present

## 2012-08-28 DIAGNOSIS — N186 End stage renal disease: Secondary | ICD-10-CM | POA: Diagnosis not present

## 2012-08-28 DIAGNOSIS — D631 Anemia in chronic kidney disease: Secondary | ICD-10-CM | POA: Diagnosis not present

## 2012-08-28 DIAGNOSIS — N2581 Secondary hyperparathyroidism of renal origin: Secondary | ICD-10-CM | POA: Diagnosis not present

## 2012-08-29 DIAGNOSIS — L570 Actinic keratosis: Secondary | ICD-10-CM | POA: Diagnosis not present

## 2012-08-30 DIAGNOSIS — N186 End stage renal disease: Secondary | ICD-10-CM | POA: Diagnosis not present

## 2012-08-30 DIAGNOSIS — N2581 Secondary hyperparathyroidism of renal origin: Secondary | ICD-10-CM | POA: Diagnosis not present

## 2012-08-30 DIAGNOSIS — D631 Anemia in chronic kidney disease: Secondary | ICD-10-CM | POA: Diagnosis not present

## 2012-09-02 DIAGNOSIS — D631 Anemia in chronic kidney disease: Secondary | ICD-10-CM | POA: Diagnosis not present

## 2012-09-02 DIAGNOSIS — N186 End stage renal disease: Secondary | ICD-10-CM | POA: Diagnosis not present

## 2012-09-02 DIAGNOSIS — N2581 Secondary hyperparathyroidism of renal origin: Secondary | ICD-10-CM | POA: Diagnosis not present

## 2012-09-04 DIAGNOSIS — D631 Anemia in chronic kidney disease: Secondary | ICD-10-CM | POA: Diagnosis not present

## 2012-09-04 DIAGNOSIS — N2581 Secondary hyperparathyroidism of renal origin: Secondary | ICD-10-CM | POA: Diagnosis not present

## 2012-09-04 DIAGNOSIS — N186 End stage renal disease: Secondary | ICD-10-CM | POA: Diagnosis not present

## 2012-09-06 DIAGNOSIS — N2581 Secondary hyperparathyroidism of renal origin: Secondary | ICD-10-CM | POA: Diagnosis not present

## 2012-09-06 DIAGNOSIS — N186 End stage renal disease: Secondary | ICD-10-CM | POA: Diagnosis not present

## 2012-09-07 DIAGNOSIS — N186 End stage renal disease: Secondary | ICD-10-CM | POA: Diagnosis not present

## 2012-09-09 DIAGNOSIS — N186 End stage renal disease: Secondary | ICD-10-CM | POA: Diagnosis not present

## 2012-09-09 DIAGNOSIS — D631 Anemia in chronic kidney disease: Secondary | ICD-10-CM | POA: Diagnosis not present

## 2012-09-09 DIAGNOSIS — N2581 Secondary hyperparathyroidism of renal origin: Secondary | ICD-10-CM | POA: Diagnosis not present

## 2012-09-15 DIAGNOSIS — G609 Hereditary and idiopathic neuropathy, unspecified: Secondary | ICD-10-CM | POA: Diagnosis not present

## 2012-09-15 DIAGNOSIS — E119 Type 2 diabetes mellitus without complications: Secondary | ICD-10-CM | POA: Diagnosis not present

## 2012-09-15 DIAGNOSIS — Q6689 Other  specified congenital deformities of feet: Secondary | ICD-10-CM | POA: Diagnosis not present

## 2012-09-22 ENCOUNTER — Telehealth: Payer: Self-pay | Admitting: General Practice

## 2012-09-22 ENCOUNTER — Ambulatory Visit (INDEPENDENT_AMBULATORY_CARE_PROVIDER_SITE_OTHER): Payer: Medicare Other | Admitting: Family Medicine

## 2012-09-22 ENCOUNTER — Encounter: Payer: Self-pay | Admitting: Family Medicine

## 2012-09-22 VITALS — BP 128/60 | HR 58 | Temp 98.2°F | Ht 66.0 in | Wt 179.0 lb

## 2012-09-22 DIAGNOSIS — E1140 Type 2 diabetes mellitus with diabetic neuropathy, unspecified: Secondary | ICD-10-CM

## 2012-09-22 DIAGNOSIS — E1149 Type 2 diabetes mellitus with other diabetic neurological complication: Secondary | ICD-10-CM

## 2012-09-22 DIAGNOSIS — Z5181 Encounter for therapeutic drug level monitoring: Secondary | ICD-10-CM | POA: Diagnosis not present

## 2012-09-22 DIAGNOSIS — E1142 Type 2 diabetes mellitus with diabetic polyneuropathy: Secondary | ICD-10-CM

## 2012-09-22 DIAGNOSIS — Z79899 Other long term (current) drug therapy: Secondary | ICD-10-CM | POA: Diagnosis not present

## 2012-09-22 LAB — BASIC METABOLIC PANEL
GFR: 14.69 mL/min — CL (ref >60.00)
Potassium: 3.7 mEq/L (ref 3.5–5.1)
Sodium: 137 mEq/L (ref 135–145)

## 2012-09-22 NOTE — Telephone Encounter (Signed)
[facility] called in a critical regarding pt's GFR 14.69

## 2012-09-22 NOTE — Telephone Encounter (Signed)
Noted  

## 2012-09-22 NOTE — Assessment & Plan Note (Signed)
Chronic problem.  Pt's CBGs remain labile depending on HD schedule and diet.  UTD on eye and foot exam (done by podiatry).  Pt prefers for CBGs to run high rather than have symptomatic lows.  Check labs.  Adjust meds prn

## 2012-09-22 NOTE — Telephone Encounter (Signed)
Pt is on HD- this is normal for him

## 2012-09-22 NOTE — Progress Notes (Signed)
  Subjective:    Patient ID: [name] [name], male    DOB: 1940-07-12, 72 y.o.   MRN: [id number]  HPI DM- chronic problem, pt is on sliding scale insulin that he self adjusts based on CBGs.  CBGs remain labile according to pt's readings: 98-200.  UTD on eye exam.  Not exercising due to pain in back and hips.  'not eating right'.  Having infrequent lows.  + neuropathy of feet.   No longer on ACE- BP was dropping low regularly.  Continues to have CP- cardiology aware.  Denies SOB.  No HAs, visual changes.  Intermittent edema depending on HD schedule.   Review of Systems For ROS see HPI     Objective:   Physical Exam  Vitals reviewed. Constitutional: He is oriented to person, place, and time. He appears well-developed and well-nourished. No distress.  HENT:  Head: Normocephalic and atraumatic.  Eyes: Conjunctivae and EOM are normal. Pupils are equal, round, and reactive to light.  Neck: Normal range of motion. Neck supple. No thyromegaly present.  Cardiovascular: Normal rate, regular rhythm and intact distal pulses.   Murmur heard. Pulmonary/Chest: Effort normal and breath sounds normal. No respiratory distress.  Abdominal: Soft. Bowel sounds are normal. He exhibits no distension.  Musculoskeletal: He exhibits no edema.  Lymphadenopathy:    He has no cervical adenopathy.  Neurological: He is alert and oriented to person, place, and time. No cranial nerve deficit.  Skin: Skin is warm and dry.  Psychiatric: He has a normal mood and affect. His behavior is normal.          Assessment & Plan:

## 2012-09-22 NOTE — Patient Instructions (Addendum)
Follow up in 3 months to recheck sugars and cholesterol Please call your cardiologist We'll notify you of your lab results and make any changes if needed Call with any questions or concerns Hang in there!!!

## 2012-09-23 ENCOUNTER — Other Ambulatory Visit: Payer: Self-pay | Admitting: Family Medicine

## 2012-09-24 NOTE — Telephone Encounter (Signed)
Rx filled. SW, CMA

## 2012-09-26 ENCOUNTER — Encounter: Payer: Self-pay | Admitting: *Deleted

## 2012-09-29 ENCOUNTER — Ambulatory Visit (INDEPENDENT_AMBULATORY_CARE_PROVIDER_SITE_OTHER): Payer: Medicare Other | Admitting: Cardiology

## 2012-09-29 ENCOUNTER — Encounter: Payer: Self-pay | Admitting: Cardiology

## 2012-09-29 VITALS — BP 160/66 | HR 61 | Ht 65.0 in | Wt 178.4 lb

## 2012-09-29 DIAGNOSIS — R079 Chest pain, unspecified: Secondary | ICD-10-CM | POA: Diagnosis not present

## 2012-09-29 DIAGNOSIS — I214 Non-ST elevation (NSTEMI) myocardial infarction: Secondary | ICD-10-CM

## 2012-09-29 DIAGNOSIS — E785 Hyperlipidemia, unspecified: Secondary | ICD-10-CM

## 2012-09-29 DIAGNOSIS — I25810 Atherosclerosis of coronary artery bypass graft(s) without angina pectoris: Secondary | ICD-10-CM | POA: Diagnosis not present

## 2012-09-29 DIAGNOSIS — I2510 Atherosclerotic heart disease of native coronary artery without angina pectoris: Secondary | ICD-10-CM

## 2012-09-29 DIAGNOSIS — I110 Hypertensive heart disease with heart failure: Secondary | ICD-10-CM

## 2012-09-29 DIAGNOSIS — R0601 Orthopnea: Secondary | ICD-10-CM | POA: Diagnosis not present

## 2012-09-29 DIAGNOSIS — I10 Essential (primary) hypertension: Secondary | ICD-10-CM

## 2012-09-29 DIAGNOSIS — I5030 Unspecified diastolic (congestive) heart failure: Secondary | ICD-10-CM

## 2012-09-29 DIAGNOSIS — I25709 Atherosclerosis of coronary artery bypass graft(s), unspecified, with unspecified angina pectoris: Secondary | ICD-10-CM | POA: Insufficient documentation

## 2012-09-29 DIAGNOSIS — Z9861 Coronary angioplasty status: Secondary | ICD-10-CM

## 2012-09-29 MED ORDER — ISOSORBIDE MONONITRATE ER 30 MG PO TB24: 30.0000 mg | ORAL_TABLET | Freq: Every day | ORAL | Status: DC

## 2012-09-29 NOTE — Patient Instructions (Addendum)
Your physician wants you to follow-up  AFTER YOUR TEST.  You will receive a reminder letter in the mail two months in advance. If you don't receive a letter, please call our office to schedule the follow-up appointment.  Your physician has requested that you have an echocardiogram. Echocardiography is a painless test that uses sound waves to create images of your heart. It provides your doctor with information about the size and shape of your heart and how well your heart's chambers and valves are working. This procedure takes approximately one hour. There are no restrictions for this procedure.   Your physician has requested that you have a lexiscan myoview. For further information please visit [email or web address]. Please follow instruction sheet, as given.  Start Imdur (ISOSRBIDE MN)  30 MG ONE TABLET DAILY

## 2012-09-30 ENCOUNTER — Other Ambulatory Visit (HOSPITAL_COMMUNITY): Payer: Self-pay | Admitting: Cardiology

## 2012-09-30 ENCOUNTER — Encounter: Payer: Self-pay | Admitting: Cardiology

## 2012-09-30 DIAGNOSIS — I214 Non-ST elevation (NSTEMI) myocardial infarction: Secondary | ICD-10-CM | POA: Insufficient documentation

## 2012-09-30 DIAGNOSIS — R079 Chest pain, unspecified: Secondary | ICD-10-CM

## 2012-09-30 NOTE — Assessment & Plan Note (Signed)
He remains on aspirin and Plavix (DAPT) as well as statin, and max dose carvedilol. He is not on an ACE inhibitor because of his ESRD and a question of how well this class of medications and function.

## 2012-09-30 NOTE — Assessment & Plan Note (Signed)
This is concerning for exacerbation of his chronic diastolic dysfunction. He may suddenly have to do with the need to adjust volume removal as his symptoms have improved since that's been done.  He could also be related to ischemic dysfunction from recurrent coronary or graft disease.  Plan: Continue with lower dry weight in dialysis  Imdur  2-D echocardiogram  LexiScan Myoview

## 2012-09-30 NOTE — Assessment & Plan Note (Signed)
His blood pressure tends to drop during dialysis and is a chronic issue for him. I am not inclined to affect on the current hypertension today. He is due for dialysis tomorrow and his blood pressure will be lower. I also question the benefit of adding an ACE inhibitor in a patient on renal replacement therapy.

## 2012-09-30 NOTE — Assessment & Plan Note (Signed)
I am quite concerned about his symptoms. There nitroglycerin responsive, they're postprandial and also have to do with volume overload related dyspnea. Although this is not his typical anginal equivalent, I am concerned that he could have some recurrent graft disease.  Plan: Add Imdur 30 mg  LexiScan Myoview  2-D echocardiogram

## 2012-09-30 NOTE — Assessment & Plan Note (Addendum)
Currently on low-dose statin, tolerating it well. Notable improvement of LDL and HDL. Triglycerides remain elevated. This has been followed by his PCP. Would consider adding fenofibrate.

## 2012-09-30 NOTE — Assessment & Plan Note (Signed)
He clearly has substrate for anginal symptoms. Just the extent of his cardiac issue and would warrant evaluation of this new symptom complex event his and his classic anginal symptoms. His arthritis pains in his hips and back won't let him really exercise that much to see if his exertional.  Plan: LexiScan Myoview

## 2012-09-30 NOTE — Progress Notes (Signed)
PCP: Annye Asa, MD  Clinic Note: Chief Complaint  Patient presents with  . ROV 5 months. Transfer of care [facility]    Chest pain x~1 month, happens mostly at night.     HPI: [name] [name] is a 72 y.o. male with a PMH below who presents today for 8 months followup. He was last seen by Dr. Rollene Fare in January 2014. He is now seeing me for the first time as his cardiologist since I performed his last PCI in October 2012. His cardiac history dates back to 3 where he underwent CABG x3 (LIMA-LAD, SVG-diagonal, SVG-OM). He had PTCA of the circumflex in 1997 as well as in 2002 for class IV angina.  Finally in 2006 she presented with non-STEMI and was found to have an occluded vein graft to the OM1. He was then referred for redo CABG with SVG-OM1 and SVG-OM 2, with a patent LIMA from the initial surgery. In the interim he then developed end-stage renal disease and a fistula placed in May of 2011. I then met him when he presented with a non-STEMI in October 2012. High-grade lesion in the SVG-OM1 treated with a bare-metal Integrity BMS 3.5 mm x 15 mm postdilated up to 3.8 mm. A followup nuclear stress test a year later did not show any evidence of ischemia or infarction just diaphragmatic attenuation. He does have a known Left Bundle Branch Block, but has not had any heart related symptoms in the past.  When he saw Dr. Rollene Fare in May of this year, he noted taking a lot of nitroglycerin over the past month. Of note, his amlodipine had been discontinued due to hypotension prior to that. There was no thought of adjusting the mucin occlusion at that time.  Interval History: He now presents saying that over last couple weeks he's been noticing a different type of sensation in his chest which is not like his usual anginal symptoms. He described a tightness in the upper chest on both sides of the sternum that is, dull ache. It often times happens after eating or at night. It is deathly better on  the post dialysis days predialysis days. It has been alleviated with nitroglycerin and he hasn't taken much motion more frequently. He is occasionally woken up at night with this tightness in his chest and is relieved with nitroglycerin. He said that the symptoms have improved since they have started adjusting his dry weight at dialysis. He is noting a single less of the associated dyspnea but as well. He doesn't describe apnea type symptoms except he may note dyspnea with the tightness is just on predialysis days. Otherwise he really denies any orthopnea or edema. He's not been as active as he usually was because of hip and back pain.  The remainder of Cardiovascular ROS is as follows: negative for - edema, irregular heartbeat, loss of consciousness, palpitations, rapid heart rate or shortness of breath at rest. Additional cardiac review of systems: Lightheadedness - occasionally when his blood pressure goes down during dialysis, dizziness - not really, syncope/near-syncope - no; TIA/amaurosis fugax - no Melena - no, hematochezia no; hematuria - no; nosebleeds - no; claudication - no  Past Medical History  Diagnosis Date  . Diabetes mellitus   . Hyperlipidemia   . Hypertension   . Gout   . Hypothyroidism     On supplementation  . LBBB (left bundle branch block)     Chronic  . History of colon cancer 2003    colectomy for  CA. no recurrence . never required  radiation or chem  . Anemia     Likely secondary to history of GI bleed  . Stroke 1998  . End stage renal disease on dialysis     Dialyzes at [facility]: Tues/Th/Sat - left upper cavity AV fistula brachiocephalic  . CAD in native artery; and the grafts 1992, 1997, 2002, 2006, 2012    CABG 1992 and redo CABG 2006  . CAD (coronary artery disease) of bypass graft 2006, 2012    In 2006: Occluded SVG-OM noted (SVG-diagonal was previously occluded); 2012: Severe lesion in redo SVG-OM1 --> BMS PCI   . CAD S/P percutaneous coronary  angioplasty October 2012    Non-STEMI: PCI to SVG-OM1 Integrity BMS 3.5 mm x 15 mm (3.85 mm)  . History of nuclear stress test October 2013    EF 45%, diaphragmatic attenuation, no ischemia or infarction.  . Echocardiogram findings abnormal, without diagnosis January 2014    EF 50-55%, anteroseptal hypokinesis. Mild to moderate concentric LVH. Mild MR, aortic sclerosis. Mild atrial dilatation bilaterally.  Marland Kitchen History of Non-ST elevated myocardial infarction (non-STEMI) 1992, 2006, 2014  . Peptic ulcer disease  2004  . BPH (benign prostatic hyperplasia)   . Chronic low back pain     Prior Cardiac Evaluation and Past Surgical History: Past Surgical History  Procedure Laterality Date  . Appendectomy    . Cholecystectomy  2009  . Eye surgery      bilateral cataracts   . Spinal fusion    . Av fistula repair Left     5/11  . Colon resection      transverse and proximal descending w/ primar anastomosis  . Doppler echocardiography  01/25/2012    EF 50 to 55%  . Nm myocar perf wall motion  10/12/2011    EF 54%,LV normal ; no signifiant ischemia  . Cardiac catheterization  10/16/2010    patent  LIMA to the LAD , PATENT  svg TO 2nd marginals ,occluded PLA with collaterals and SVG to the OM  had 90% stenosis  was txlge  bare - metal  3.5  Integrity ten  postdilate  36-37  mm    . Cardiac catheterization  03/22/2004    loss of both SVG,severe disease in prox and ostial  circ not amenable to invention. mod diease distal LAD  AFTER BYPASS GRAFT;-CVTS to evaluate   . Coronary angioplasty  03/26/19971997    OM and Circ  . Coronary angioplasty  02/01/2000    [facility]  . Carotid doppler  05/23/2012    ABN CAROTID-- RGT BULB/PROX ICA mild to mod 50-60%;lft bulb/prox mild to mod 0-49%;left subclavian abn waveforms consistent with patients lft arm A/V fistula  . Event monitor  01/23/2012-02/06/2012    SINUS ,LBBB,unifocal PVCs  . Coronary artery bypass graft  08/22/1990    INITIAL-LIMA to LAD, SVG to   Diagonal, SVG to OM  . Coronary artery bypass graft  2006    SVG to OM1,SVG to OM2 with patent LIMA  to LAD and occluded vein  graft to the diagonal  and occluded vein grft to OM from  prior  surgery    Allergies  Allergen Reactions  . Shellfish Allergy     Causes gout flare-ups    Current Outpatient Prescriptions  Medication Sig Dispense Refill  . allopurinol (ZYLOPRIM) 100 MG tablet Take 1 tablet (100 mg total) by mouth daily.  90 tablet  3  . aspirin (ASPIRIN CHILDRENS) 81 MG chewable tablet  Chew 162 mg by mouth at bedtime.       Marland Kitchen atorvastatin (LIPITOR) 10 MG tablet Take 1 tablet (10 mg total) by mouth daily.  90 tablet  3  . calcium acetate (PHOSLO) 667 MG capsule Take 1 capsule (667 mg total) by mouth 3 (three) times daily with meals.  210 capsule  3  . carvedilol (COREG) 25 MG tablet Take 1 tablet (25 mg total) by mouth 2 (two) times daily with a meal.  180 tablet  3  . clopidogrel (PLAVIX) 75 MG tablet Take 1 tablet (75 mg total) by mouth daily.  90 tablet  3  . glucose blood (PRODIGY NO CODING BLOOD GLUC) test strip Test as directed  100 each  6  . levothyroxine (SYNTHROID, LEVOTHROID) 125 MCG tablet Take 1 tablet (125 mcg total) by mouth daily.  90 tablet  3  . loperamide (IMODIUM) 2 MG capsule Take 2 mg by mouth 4 (four) times daily as needed. For diarrhea      . multivitamin (RENA-VIT) TABS tablet Take 1 tablet by mouth daily.  90 tablet  3  . nitroGLYCERIN (NITROSTAT) 0.4 MG SL tablet Place 1 tablet (0.4 mg total) under the tongue every 5 (five) minutes as needed. For chest pain  30 tablet  3  . NOVOLIN N RELION 100 UNIT/ML injection INJECT 40 UNITS SUBCUTANEOUSLY TWICE DAILY  10 mL  6  . temazepam (RESTORIL) 15 MG capsule TAKE ONE CAPSULE BY MOUTH EVERY DAY AT BEDTIME AS NEEDED FOR SLEEP  90 capsule  1  . terazosin (HYTRIN) 2 MG capsule Take 1 capsule (2 mg total) by mouth at bedtime.  90 capsule  3  . isosorbide mononitrate (IMDUR) 30 MG 24 hr tablet Take 1 tablet (30 mg  total) by mouth daily.  90 tablet  3   No current facility-administered medications for this visit.    Imdur added today  History   Social History Narrative   Long-term patient of Dr. Rollene Fare.   Married father of 22, grandfather 82.   Never smoked.   Not exercising D2 hip and back pain    ROS: A comprehensive Review of Systems - Negative except Pertinence as noted above and other noncardiac symptoms below. General ROS: positive for  - fatigue and weight loss Musculoskeletal ROS: positive for - Hip and back pains.  PHYSICAL EXAM BP 160/66  Pulse 61  Ht 5\' 5"  (1.651 m)  Wt 178 lb 6.4 oz (80.922 kg)  BMI 29.69 kg/m2 General appearance: alert, cooperative, appears stated age, no distress and Borderline obese. Relatively healthy-appearing, well groomed.Marland Kitchen answers questions appropriately. Neck: no adenopathy, no carotid bruit, no JVD and supple, symmetrical, trachea midline Lungs: clear to auscultation bilaterally, normal percussion bilaterally and Nonlabored, good air movement Heart: normal apical impulse, regular rate and rhythm, S1, S2 normal, S4 present, systolic murmur: SEM 2/6, crescendo, decrescendo and harsh at 2nd right intercostal space, no click and no rub Abdomen: soft, non-tender; bowel sounds normal; no masses,  no organomegaly and Mild truncal obesity Extremities: extremities normal, atraumatic, no cyanosis or edema and fistula in the left upper arm with good thrill Pulses: 2+ and symmetric Except for right radial Neurologic: Grossly normal  [id number] today: Yes Rate: 61 , Rhythm: NSR, LBBB; no significant change  Recent Labs:   Lipid Panel 06/20/2012: TC 1.3, TG 245, LDL 55.4 (down from 77.9), HDL 26.7 (up from 23.10)  ASSESSMENT / PLAN: Chest pain with moderate risk for cardiac etiology I am quite concerned about  his symptoms. There nitroglycerin responsive, they're postprandial and also have to do with volume overload related dyspnea. Although this is not  his typical anginal equivalent, I am concerned that he could have some recurrent graft disease.  Plan: Add Imdur 30 mg  LexiScan Myoview  2-D echocardiogram  Orthopnea This is concerning for exacerbation of his chronic diastolic dysfunction. He may suddenly have to do with the need to adjust volume removal as his symptoms have improved since that's been done.  He could also be related to ischemic dysfunction from recurrent coronary or graft disease.  Plan: Continue with lower dry weight in dialysis  Imdur  2-D echocardiogram  LexiScan Myoview  CAD (coronary artery disease) of bypass graft - PCI to SVG-OM with BMS He clearly has substrate for anginal symptoms. Just the extent of his cardiac issue and would warrant evaluation of this new symptom complex event his and his classic anginal symptoms. His arthritis pains in his hips and back won't let him really exercise that much to see if his exertional.  Plan: LexiScan Myoview  History of Non-ST elevated myocardial infarction (non-STEMI) He did have a clear anginal symptoms with his prior non-STEMI presentations both in 2000 610-12. He also remembers for the back further at that his symptom was not quite like this it wasn't across his upper chest it is more localized in a more intense pain. However the timing of the symptoms is very similar.  CAD S/P percutaneous coronary angioplasty He remains on aspirin and Plavix (DAPT) as well as statin, and max dose carvedilol. He is not on an ACE inhibitor because of his ESRD and a question of how well this class of medications and function.  HYPERTENSION His blood pressure tends to drop during dialysis and is a chronic issue for him. I am not inclined to affect on the current hypertension today. He is due for dialysis tomorrow and his blood pressure will be lower. I also question the benefit of adding an ACE inhibitor in a patient on renal replacement therapy.  HYPERLIPIDEMIA Currently on  low-dose statin, tolerating it well. Notable improvement of LDL and HDL. Triglycerides remain elevated. This has been followed by his PCP. Would consider adding fenofibrate.     Orders Placed This Encounter  Procedures  . Myocardial Perfusion Imaging    Standing Status: Future     Number of Occurrences:      Standing Expiration Date: 09/29/2013    Order Specific Question:  Where should this test be performed    Answer:  [facility]    Order Specific Question:  Type of stress    Answer:  Exercise    Order Specific Question:  Patient weight in lbs    Answer:  178  . EKG 12-Lead  . 2D Echocardiogram without contrast    Standing Status: Future     Number of Occurrences:      Standing Expiration Date: 09/29/2013    Order Specific Question:  Type of Echo    Answer:  Complete    Order Specific Question:  Where should this test be performed    Answer:  [facility]    Order Specific Question:  Reason for exam-Echo    Answer:  Dyspnea  786.09   Meds ordered this encounter  Medications  . isosorbide mononitrate (IMDUR) 30 MG 24 hr tablet    Sig: Take 1 tablet (30 mg total) by mouth daily.    Dispense:  90 tablet    Refill:  3   Followup: One month  Devan Babino W. Ellyn Hack, M.D., M.S. [facility] [address] [place], [place]  [id number]  [phone] Pager # [phone]

## 2012-09-30 NOTE — Assessment & Plan Note (Signed)
He did have a clear anginal symptoms with his prior non-STEMI presentations both in 2000 610-12. He also remembers for the back further at that his symptom was not quite like this it wasn't across his upper chest it is more localized in a more intense pain. However the timing of the symptoms is very similar.

## 2012-10-03 ENCOUNTER — Ambulatory Visit (HOSPITAL_COMMUNITY)
Admission: RE | Admit: 2012-10-03 | Discharge: 2012-10-03 | Disposition: A | Discharge status: Home / Self Care | Payer: Medicare Other | Source: Ambulatory Visit | Attending: Cardiovascular Disease | Admitting: Cardiovascular Disease

## 2012-10-03 DIAGNOSIS — R079 Chest pain, unspecified: Secondary | ICD-10-CM | POA: Diagnosis not present

## 2012-10-03 DIAGNOSIS — I25810 Atherosclerosis of coronary artery bypass graft(s) without angina pectoris: Secondary | ICD-10-CM | POA: Insufficient documentation

## 2012-10-03 DIAGNOSIS — R0601 Orthopnea: Secondary | ICD-10-CM | POA: Diagnosis not present

## 2012-10-03 DIAGNOSIS — I10 Essential (primary) hypertension: Secondary | ICD-10-CM | POA: Insufficient documentation

## 2012-10-03 DIAGNOSIS — I2510 Atherosclerotic heart disease of native coronary artery without angina pectoris: Secondary | ICD-10-CM

## 2012-10-03 MED ORDER — REGADENOSON 0.4 MG/5ML IV SOLN
0.4000 mg | Freq: Once | INTRAVENOUS | Status: AC
  Administered 2012-10-03: 0.4 mg via INTRAVENOUS

## 2012-10-03 MED ORDER — TECHNETIUM TC 99M SESTAMIBI GENERIC - CARDIOLITE
30.4000 | Freq: Once | INTRAVENOUS | Status: AC | PRN
  Administered 2012-10-03: 30 via INTRAVENOUS

## 2012-10-03 MED ORDER — TECHNETIUM TC 99M SESTAMIBI GENERIC - CARDIOLITE
10.3000 | Freq: Once | INTRAVENOUS | Status: AC | PRN
  Administered 2012-10-03: 10 via INTRAVENOUS

## 2012-10-03 MED ORDER — AMINOPHYLLINE 25 MG/ML IV SOLN
75.0000 mg | Freq: Once | INTRAVENOUS | Status: AC
  Administered 2012-10-03: 75 mg via INTRAVENOUS

## 2012-10-03 NOTE — Procedures (Addendum)
[facility] [address] [place] [address] [id number]  Cardiology Nuclear Med Study  [name] [name] is a 72 y.o. male     MRN : [id number]     DOB: [birth year]  Procedure Date: 10/03/2012  Nuclear Med Background Indication for Stress Test:  Graft Patency History:  CAD;MI--11/2004;CABG X3--1992 AND 2006;STENT/PTCA--10/2010 Cardiac Risk Factors: Family History - CAD, Hypertension, IDDM Type 2, LBBB, Lipids, Overweight and TIA  Symptoms:  Chest Pain, Dizziness, DOE, Fatigue, Light-Headedness and Palpitations   Nuclear Pre-Procedure Caffeine/Decaff Intake:  10:00pm NPO After: 8:00am   IV Site: R Hand  IV 0.9% NS with Angio Cath:  22g  Chest Size (in):  40"  IV Started by: Azucena Cecil, RN  Height: 5\' 5"  (1.651 m)  Cup Size: n/a  BMI:  Body mass index is 29.62 kg/(m^2). Weight:  178 lb (80.74 kg)   Tech Comments:  N/A    Nuclear Med Study 1 or 2 day study: 1 day  Stress Test Type:  Lexiscan  Order Authorizing Provider:  [name], MD   Resting Radionuclide: Technetium 46m Sestamibi  Resting Radionuclide Dose: 10.3 mCi   Stress Radionuclide:  Technetium 90m Sestamibi  Stress Radionuclide Dose: 30.4 mCi           Stress Protocol Rest HR: 61 Stress HR: 64  Rest BP: 176/67 Stress BP: 150/57  Exercise Time (min): n/a METS: n/a   Predicted Max HR: 149 bpm % Max HR: 51.01 bpm Rate Pressure Product: 13376  Dose of Adenosine (mg):  n/a Dose of Lexiscan: 0.4 mg  Dose of Atropine (mg): n/a Dose of Dobutamine: n/a mcg/kg/min (at max HR)  Stress Test Technologist: Leane Para, CCT Nuclear Technologist: Imagene Riches, CNMT   Rest Procedure:  Myocardial perfusion imaging was performed at rest 45 minutes following the intravenous administration of Technetium 1m Sestamibi. Stress Procedure:  The patient received IV Lexiscan 0.4 mg over 15-seconds.  Technetium 43m Sestamibi injected at 30-seconds.  The Patient  experienced Chest Tightness and Right arm pain; 75 mg of IV Aminophylline was administered with resolution of symptoms.  There were no significant changes with Lexiscan.  Quantitative spect images were obtained after a 45 minute delay.  Transient Ischemic Dilatation (Normal <1.22):  0.99 Lung/Heart Ratio (Normal <0.45):  0.27 QGS EDV:  134 ml QGS ESV:  82 ml LV Ejection Fraction: 39%     Rest ECG: NSR-LBBB  Stress ECG: No significant change from baseline ECG  QPS Raw Data Images:  Normal; no motion artifact; normal heart/lung ratio. Stress Images:  There is decreased uptake in the inferior wall. Rest Images:  There is decreased uptake in the inferior wall. Subtraction (SDS):  No evidence of ischemia. LV Wall Motion:  Global hypokinesis and severe inferobasal hypokinesis, profound dyssyncrony  Impression Exercise Capacity:  Lexiscan with no exercise. BP Response:  Normal blood pressure response. Clinical Symptoms:  No significant symptoms noted. ECG Impression:  Baseline:  LBBB.  EKG uninterpretable due to LBBB at rest and stress. Comparison with Prior Nuclear Study: No images to compare   Overall Impression:  Low risk stress nuclear study with inferobasal scar and no reversible ischemia Consider superimposed nonischemic cardiomyopathy, but cannot reliably exclude balanced ischemia.Marland Kitchen   Sanda Klein, MD  10/03/2012 1:24 PM

## 2012-10-06 ENCOUNTER — Telehealth: Payer: Self-pay | Admitting: *Deleted

## 2012-10-06 NOTE — Telephone Encounter (Signed)
Spoke to patient. Result given . Verbalized understanding  

## 2012-10-06 NOTE — Telephone Encounter (Signed)
Message copied by Raiford Simmonds on Mon Oct 06, 2012 10:57 AM ------      Message from: [facility], DAVID      Created: Fri Oct 03, 2012  6:07 PM       No evidence of "ishcemic" blockage.        Looks like old scar from prior MI.            CP is probably not cardiac.      Leonie Man, MD       ------

## 2012-10-07 DIAGNOSIS — N186 End stage renal disease: Secondary | ICD-10-CM | POA: Diagnosis not present

## 2012-10-08 HISTORY — PX: OTHER SURGICAL HISTORY: SHX169

## 2012-10-09 DIAGNOSIS — D631 Anemia in chronic kidney disease: Secondary | ICD-10-CM | POA: Diagnosis not present

## 2012-10-09 DIAGNOSIS — N186 End stage renal disease: Secondary | ICD-10-CM | POA: Diagnosis not present

## 2012-10-09 DIAGNOSIS — N2581 Secondary hyperparathyroidism of renal origin: Secondary | ICD-10-CM | POA: Diagnosis not present

## 2012-10-09 DIAGNOSIS — Z23 Encounter for immunization: Secondary | ICD-10-CM | POA: Diagnosis not present

## 2012-10-15 ENCOUNTER — Ambulatory Visit (HOSPITAL_COMMUNITY)
Admission: RE | Admit: 2012-10-15 | Discharge: 2012-10-15 | Disposition: A | Discharge status: Home / Self Care | Payer: Medicare Other | Source: Ambulatory Visit | Attending: Cardiovascular Disease | Admitting: Cardiovascular Disease

## 2012-10-15 DIAGNOSIS — R079 Chest pain, unspecified: Secondary | ICD-10-CM

## 2012-10-15 DIAGNOSIS — R0609 Other forms of dyspnea: Secondary | ICD-10-CM | POA: Diagnosis not present

## 2012-10-15 DIAGNOSIS — R0601 Orthopnea: Secondary | ICD-10-CM

## 2012-10-15 DIAGNOSIS — I25810 Atherosclerosis of coronary artery bypass graft(s) without angina pectoris: Secondary | ICD-10-CM | POA: Insufficient documentation

## 2012-10-15 DIAGNOSIS — R0989 Other specified symptoms and signs involving the circulatory and respiratory systems: Secondary | ICD-10-CM | POA: Insufficient documentation

## 2012-10-15 NOTE — Progress Notes (Signed)
2D Echo Performed 10/15/2012    Marygrace Drought, RCS

## 2012-10-18 DIAGNOSIS — N186 End stage renal disease: Secondary | ICD-10-CM | POA: Diagnosis not present

## 2012-10-18 DIAGNOSIS — N2581 Secondary hyperparathyroidism of renal origin: Secondary | ICD-10-CM | POA: Diagnosis not present

## 2012-10-22 ENCOUNTER — Encounter: Payer: Self-pay | Admitting: Cardiology

## 2012-10-22 ENCOUNTER — Telehealth: Payer: Self-pay | Admitting: Cardiology

## 2012-10-22 ENCOUNTER — Ambulatory Visit (INDEPENDENT_AMBULATORY_CARE_PROVIDER_SITE_OTHER): Payer: Medicare Other | Admitting: Cardiology

## 2012-10-22 VITALS — BP 118/60 | HR 64 | Wt 175.6 lb

## 2012-10-22 DIAGNOSIS — I2510 Atherosclerotic heart disease of native coronary artery without angina pectoris: Secondary | ICD-10-CM | POA: Diagnosis not present

## 2012-10-22 DIAGNOSIS — I739 Peripheral vascular disease, unspecified: Secondary | ICD-10-CM | POA: Diagnosis not present

## 2012-10-22 DIAGNOSIS — I5030 Unspecified diastolic (congestive) heart failure: Secondary | ICD-10-CM

## 2012-10-22 DIAGNOSIS — I209 Angina pectoris, unspecified: Secondary | ICD-10-CM

## 2012-10-22 DIAGNOSIS — E785 Hyperlipidemia, unspecified: Secondary | ICD-10-CM

## 2012-10-22 DIAGNOSIS — Z9861 Coronary angioplasty status: Secondary | ICD-10-CM

## 2012-10-22 DIAGNOSIS — I10 Essential (primary) hypertension: Secondary | ICD-10-CM

## 2012-10-22 DIAGNOSIS — I110 Hypertensive heart disease with heart failure: Secondary | ICD-10-CM | POA: Diagnosis not present

## 2012-10-22 MED ORDER — FENOFIBRATE 50 MG PO CAPS: 45.0000 mg | ORAL_CAPSULE | Freq: Every day | ORAL | Status: DC

## 2012-10-22 NOTE — Progress Notes (Signed)
PCP: Annye Asa, MD  Clinic Note: Chief Complaint  Patient presents with  . Follow-up    result echo ,myoview, chest pain ,no sob , no edema, dialysis TU,TH,SAT- [place]    HPI: [name] [name] is a 72 y.o. male with a PMH below who presents today for followup after echocardiogram and Myoview. He is a former patient of Dr. Rollene Fare, whom I first met not perform a cardiac catheterization PCI in October 2012.   His cardiac history dates back to 43:  1992:  CABG x3 (LIMA-LAD, SVG-diagonal, SVG-OM).  Of these only the LIMA-LAD is patent  1997: Class IV angina, PTCA of the circumflex   2002 for class IV angina: Again PTCA to circumflex..    2006 non-STEMI: occluded vein graft to the OM1 --> redo CABG with SVG-OM1 and SVG-OM 2, with a patent LIMA from the initial surgery.  He developed End-Stage Renal Disease in May of 2011 -- now on hemodialysis.  Non-STEMI in October 2012: High-grade lesion in the SVG-OM1--> Integrity BMS 3.5 mm x 15 mm postdilated up to 3.8 mm.   Followup Nuclear Stress Test a year later did not show any evidence of ischemia or infarction just diaphragmatic attenuation.  Known Left Bundle Branch Block, but has not had any heart related symptoms in the past.  He has just had an echocardiogram and a nuclear stress test performed.  Results noted below.  Interval History: Mass on back in September, he was noting nighttime chest discomfort as well as occasional discomfort with exertion.  This is somewhat of a different type of discomfort in his usual anginal symptoms were.  Tightness in the upper chest and both sides of the sternum.  Often was after eating.  Usually worse on postdialysis days.  He did note some improvement with nitroglycerin.  I prescribed isosorbide mononitrate, and he said that has helped the symptoms.  His only had to use nitroglycerin one or 2 times since starting the Imdur.  His symptoms are still the same again in the evening in  the upper chest pressure.  The patient has helped has been the dropping of his dry weight during hemodialysis.  He does however note having a similar type discomfort in his chest with walking down the biggest trash down to the street and walking back up the hill.  He usually has to stop halfway up.  The remainder of Cardiovascular ROS is as follows: positive for - chest pain and dyspnea on exertion negative for - edema, irregular heartbeat, loss of consciousness, orthopnea, palpitations, paroxysmal nocturnal dyspnea, rapid heart rate or shortness of breath at rest. Additional cardiac review of systems: Lightheadedness - occasionally when his blood pressure goes down during dialysis, dizziness - not really, syncope/near-syncope - no; TIA/amaurosis fugax - no Melena - no, hematochezia no; hematuria - no; nosebleeds - no; claudication - no  Past Medical History  Diagnosis Date  . Diabetes mellitus   . Hyperlipidemia   . Hypertension   . Gout   . Hypothyroidism     On supplementation  . LBBB (left bundle branch block)     Chronic  . History of colon cancer 2003    colectomy for CA. no recurrence . never required  radiation or chem  . Anemia     Likely secondary to history of GI bleed  . Stroke 1998  . End stage renal disease on dialysis     Dialyzes at [facility]: Tues/Th/Sat - left upper cavity AV fistula brachiocephalic  .  CAD in native artery; and the grafts 1992, 1997, 2002, 2006, 2012    CABG 1992 and redo CABG 2006  . CAD (coronary artery disease) of bypass graft 2006, 2012    In 2006: Occluded SVG-OM noted (SVG-diagonal was previously occluded); 2012: Severe lesion in redo SVG-OM1 --> BMS PCI   . CAD S/P percutaneous coronary angioplasty October 2012    Non-STEMI: PCI to SVG-OM1 Integrity BMS 3.5 mm x 15 mm (3.85 mm)  . History of nuclear stress test Sept 2014    EF 39% (down from) 45%, no ischemia; but now the inferobasal perfusion defect is determined to be likley prior  infarction (as opposed to diaphragmatic attenuation)  . Echocardiogram findings abnormal, without diagnosis [id number]; Oct 2014    EF 50-55%, anteroseptal hypokinesis. Mild to moderate concentric LVH. Mild MR, aortic sclerosis. Mild atrial dilatation bilaterally.; Oct - mild LVH, EF ~50-55%, incoordinate septal WM, - with no other notable WMA  . History of Non-ST elevated myocardial infarction (non-STEMI) 1992, 2006, 2012  . Peptic ulcer disease  2004  . BPH (benign prostatic hyperplasia)   . Chronic low back pain     Prior Cardiac Evaluation and Past Surgical History: Past Surgical History  Procedure Laterality Date  . Doppler echocardiography  01/25/2012    EF 50 to 55%  . Nm myocar perf wall motion  10/12/2011    EF 54%,LV normal ; no signifiant ischemia  . Cardiac catheterization  10/16/2010    patent  LIMA to the LAD , PATENT  svg TO 2nd marginals ,occluded PLA with collaterals and SVG to the OM  had 90% stenosis  was txlge  bare - metal  3.5  Integrity ten  postdilate  36-37  mm    . Cardiac catheterization  03/22/2004    loss of both SVG,severe disease in prox and ostial  circ not amenable to invention. mod diease distal LAD  AFTER BYPASS GRAFT;-CVTS to evaluate   . Coronary angioplasty  03/26/19971997    OM and Circ  . Coronary angioplasty  02/01/2000    [facility]  . Carotid doppler  05/23/2012    ABN CAROTID-- RGT BULB/PROX ICA mild to mod 50-60%;lft bulb/prox mild to mod 0-49%;left subclavian abn waveforms consistent with patients lft arm A/V fistula  . Event monitor  01/23/2012-02/06/2012    SINUS ,LBBB,unifocal PVCs  . Coronary artery bypass graft  08/22/1990    INITIAL-LIMA to LAD, SVG to  Diagonal, SVG to OM  . Coronary artery bypass graft  2006    SVG to OM1,SVG to OM2 with patent LIMA  to LAD and occluded vein  graft to the diagonal  and occluded vein grft to OM from  prior  surgery    Allergies  Allergen Reactions  . Shellfish Allergy     Causes gout flare-ups     Current Outpatient Prescriptions  Medication Sig Dispense Refill  . allopurinol (ZYLOPRIM) 100 MG tablet Take 1 tablet (100 mg total) by mouth daily.  90 tablet  3  . aspirin (ASPIRIN CHILDRENS) 81 MG chewable tablet Chew 162 mg by mouth at bedtime.       . calcium acetate (PHOSLO) 667 MG capsule Take 1 capsule (667 mg total) by mouth 3 (three) times daily with meals.  210 capsule  3  . carvedilol (COREG) 25 MG tablet Take 1 tablet (25 mg total) by mouth 2 (two) times daily with a meal.  180 tablet  3  . clopidogrel (PLAVIX) 75 MG tablet Take 1 tablet (  75 mg total) by mouth daily.  90 tablet  3  . glucose blood (PRODIGY NO CODING BLOOD GLUC) test strip Test as directed  100 each  6  . isosorbide mononitrate (IMDUR) 30 MG 24 hr tablet Take 1 tablet (30 mg total) by mouth daily.  90 tablet  3  . levothyroxine (SYNTHROID, LEVOTHROID) 125 MCG tablet Take 1 tablet (125 mcg total) by mouth daily.  90 tablet  3  . loperamide (IMODIUM) 2 MG capsule Take 2 mg by mouth 4 (four) times daily as needed. For diarrhea      . multivitamin (RENA-VIT) TABS tablet Take 1 tablet by mouth daily.  90 tablet  3  . nitroGLYCERIN (NITROSTAT) 0.4 MG SL tablet Place 1 tablet (0.4 mg total) under the tongue every 5 (five) minutes as needed. For chest pain  30 tablet  3  . NOVOLIN N RELION 100 UNIT/ML injection INJECT 40 UNITS SUBCUTANEOUSLY TWICE DAILY  10 mL  6  . temazepam (RESTORIL) 15 MG capsule TAKE ONE CAPSULE BY MOUTH EVERY DAY AT BEDTIME AS NEEDED FOR SLEEP  90 capsule  1  . terazosin (HYTRIN) 2 MG capsule Take 1 capsule (2 mg total) by mouth at bedtime.  90 capsule  3  . atorvastatin (LIPITOR) 40 MG tablet Take 1 tablet (40 mg total) by mouth daily.  90 tablet  2   No current facility-administered medications for this visit.    dose of atorvastatin was 10 mg during this visit.  Increased to 40 mg when the patient informed us that he was unable to afford fenofibrate.  History   Social History Narrative    Long-term patient of Dr. Rollene Fare.   Married father of 88, grandfather 32.   Never smoked.   Not exercising D2 hip and back pain    ROS: A comprehensive Review of Systems - Negative except Pertinence as noted above and other noncardiac symptoms below. General ROS: positive for  - fatigue and weight loss Musculoskeletal ROS: positive for - Hip and back pains that is worse with walking, somewhat concerning for claudication.    PHYSICAL EXAM BP 118/60  Pulse 64  Wt 175 lb 9.6 oz (79.652 kg)  BMI 29.22 kg/m2 General appearance: alert, cooperative, appears stated age, no distress and Borderline obese. Relatively healthy-appearing, well groomed.Marland Kitchen answers questions appropriately. Neck: no adenopathy, no carotid bruit, no JVD and supple, symmetrical, trachea midline Lungs: clear to auscultation bilaterally, normal percussion bilaterally and Nonlabored, good air movement Heart: normal apical impulse, RRR, S1, S2 normal, S4 present, SEM 2/6, crescendo, decrescendo and harsh at 2nd right intercostal space, no click and no rub Abdomen: soft, non-tender; bowel sounds normal; no masses,  no organomegaly and Mild truncal obesity Extremities: extremities normal, atraumatic, no cyanosis or edema and fistula in the left upper arm with good thrill Pulses: 2+ and symmetric Except for right radial Neurologic: Grossly normal  [id number] today: No  Recent Labs:   Lipid Panel 06/20/2012: TC 1.3, TG 245, LDL 55.4 (down from 77.9), HDL 26.7 (up from 23.10)  ASSESSMENT / PLAN: Angina, class II - stable With sitting relieved by nitroglycerin, and his known coronary disease history.  The fact that he is getting better with isosorbide mononitrate would suggest to me that his symptoms are indeed angina. Thankfully there was no evidence of ischemia on his Myoview, which would suggest microvascular disease. Plan: Continue with Imdur at current dose.  May need to increase if his nitroglycerin requirement  increases.  He is currently not  interested in pursuing reevaluation with invasive strategy.  Continue dual antiplatelet therapy (aspirin/Plavix), max dose beta blocker.  Continue to treat other risk factors including dyslipidemia/hypercholesterolemia.  CAD S/P percutaneous coronary angioplasty Stable on dual antiplatelet therapy.  On beta blocker, but not ACE inhibitor due to the unsure utility with ESRD. Due to labile blood pressures with dialysis, we are not using hydralazine plus nitrate.  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  This explain why he he is improved from a symptom standpoint with lowering his dry weight.  Most of his symptoms were related to postprandial or nocturnal symptoms that have improved with decreased dry weight. Again if his blood pressure were to tolerate it, what I would want to afterload reduction with hydralazine in addition to nitrate.  HYPERLIPIDEMIA Again he's had improvement of his LDL and HDL on his lipid panel, but his triglycerides remain elevated. My initial plan was to prescribe fenofibrate at somewhere between 45-50 mg daily.  Unfortunately shortly after returning home, he went to the pharmacy in January was a was not able to afford generic fenofibrate as a Tier 4 medication.  There are cards at a discount options out there, but he would prefer to increase his statin.  I am therefore increasing his Lipitor to 40 mg daily. We will followup labs in roughly 6 months.  Gluteal claudication I am somewhat concerned that his pain in his hips and buttock could be related to claudication.  I don't think it's myalgia from his statin, but has been there since before he was on it.  It is made worse with walking down and better with rest.  Plan: Lower extremity (including distal aortoiliac) arterial Dopplers and ABIs.  Pending these results, I may refer him to Dr.  Berkley Harvey to evaluate for possible invasive treatment.  HYPERTENSION His blood pressure is well-controlled today.  It also seems dry weight is down currently weighing 175 pounds at 178 at last visit.  Again he still is a tendency to drop his blood pressures in hemodialysis.  For now I believe his blood pressures alone.  I would defer to nephrology to see if they think he could potentially tolerate some macular reduction with either adding hydralazine or an ACE inhibitor/ARB.    Orders Placed This Encounter  Procedures  . Lower Extremity Arterial Duplex    Dx    buttock claudication    Standing Status: Future     Number of Occurrences:      Standing Expiration Date: 10/22/2013    Order Specific Question:  Where should this test be performed:    Answer:  [facility]   Meds ordered this encounter  Medications  . DISCONTD: Fenofibrate 50 MG CAPS    Sig: Take 0.9 capsules (45 mg total) by mouth daily.    Dispense:  90 each    Refill:  3   Followup: 6 months  Wonda Goodgame W, M.D., M.S. [facility] [place]. [place], [place]  [address]  [phone] Pager # [phone] 10/24/2012 9:19 PM

## 2012-10-22 NOTE — Telephone Encounter (Signed)
Returned call.  Left message to call back before 4pm.  

## 2012-10-22 NOTE — Patient Instructions (Signed)
Your Stress test & Echocardiogram looked good.   I am glad to hear that Imdur has helped -- we may need to increase the dose at some point, the body has a tendency to "overcome" the lower doses.  I think we need to concentrate on your Triglyceride levels -- I am going to prescribe Fenofibrate 50 mg daily.  To evaluate your R hip / buttock pain with walking, I am going to check artery dopplers of you legs to look for artery disease.  I will see you back in ~6 months.  I would hope that you would have had another cholesterol panel by then.  Leonie Man, MD  Your physician has requested that you have a lower extremity arterial exercise duplex. During this test, exercise and ultrasound are used to evaluate arterial blood flow in the legs. Allow one hour for this exam. There are no restrictions or special instructions.  Your physician wants you to follow-up in: 6 months. You will receive a reminder letter in the mail two months in advance. If you don't receive a letter, please call our office to schedule the follow-up appointment.

## 2012-10-22 NOTE — Telephone Encounter (Signed)
Please call-just left here-this is in regards to a prescription he just received.

## 2012-10-22 NOTE — Telephone Encounter (Signed)
Returned call.  Pt stated fenofibrate is a Tier 4 medication and he cannot afford it.  Stated he can afford gemfibrozil is a Tier 1 medication and he can afford that.  Pt also stated he was on gemfibrozil before and it caused his gums to "go bad" and he'd rather not take it if he doesn't have to, but that is the only med on his Tier 1 formulary.  Pt informed Dr. Ellyn Hack will be notified for further instructions.  Pt verbalized understanding and agreed w/ plan.  Pt uses [facility] on [address].  Message forwarded to Dr. Ellyn Hack.

## 2012-10-22 NOTE — Telephone Encounter (Signed)
I did a ~2 minute on-line google search & found several options for getting Fenofibrate at ~$0.77 - ~$1 per pill.   I also found a 75% off coupon.  Try: Pharmacychecker.com -- there is a medication discount card option.  RxPharmacycoupons.com -- 75% off coupon (works @ multiple different pharmacies).  [facility] had it for $31.74 / month (30 pills), [facility] - $32.98.  I don't want him to use Gimfibrozil &-- we need to get his cholesterol (Triglycerides) down.  If this doesn't work - I will need to increase atorvastatin to 40 mg.  Leonie Man, MD

## 2012-10-23 MED ORDER — ATORVASTATIN CALCIUM 40 MG PO TABS: 40.0000 mg | ORAL_TABLET | Freq: Every day | ORAL | Status: DC

## 2012-10-23 NOTE — Telephone Encounter (Signed)
Returning call.

## 2012-10-23 NOTE — Telephone Encounter (Signed)
OK - do the Lipitor 40 mg.  I still think that with the card that I found on line for 75% off would make it an affordable med.  . We need a Education officer, museum / Tourist information centre manager to help with this kind of thing.  Leonie Man, MD

## 2012-10-23 NOTE — Telephone Encounter (Signed)
When we ordered it - Ivin Booty was looking at which dose was tiered better -- 50mg  was what came out best. It can be 45, 48 or 50mg  - whichever is less $$.  It may be best to print out the Rx (get the PA to sign & have the pt pick it up) that way, he can take it to whatever pharmacy is cheapest.   Leonie Man, MD   Returned call.  Left message to call back before 4pm.

## 2012-10-23 NOTE — Telephone Encounter (Signed)
Returned call and informed pt per instructions by MD/PA.  Pt verbalized understanding and asked RN to call pharmacy to see if other dose available on plan.  Call to pharmacy and informed sent Rx through for 45mg  and insurance will pay $100 of $213.  Stated pt will have to call insurance company to find out what is covered on formulary.  Call to pt and informed.  Pt stated he would rather just increase the atorvastatin instead of trying to get this Rx filled.  Stated he has the book from last year and for next year and both say it's a Tier 4 med.  Stated all of his meds jumped from Tier 1 to Tier 2 and he just cannot afford the fenofibrate.  Pt informed Rx will be sen to pharmacy.  Pt verbalized understanding and agreed w/ plan.    Rx sent to pharmacy and Fenofibrate dc'd.

## 2012-10-24 ENCOUNTER — Encounter: Payer: Self-pay | Admitting: Cardiology

## 2012-10-24 ENCOUNTER — Telehealth: Payer: Self-pay | Admitting: *Deleted

## 2012-10-24 DIAGNOSIS — I209 Angina pectoris, unspecified: Secondary | ICD-10-CM | POA: Insufficient documentation

## 2012-10-24 NOTE — Assessment & Plan Note (Signed)
I am somewhat concerned that his pain in his hips and buttock could be related to claudication.  I don't think it's myalgia from his statin, but has been there since before he was on it.  It is made worse with walking down and better with rest.  Plan: Lower extremity (including distal aortoiliac) arterial Dopplers and ABIs.  Pending these results, I may refer him to Dr. Berkley Harvey to evaluate for possible invasive treatment.

## 2012-10-24 NOTE — Assessment & Plan Note (Signed)
His blood pressure is well-controlled today.  It also seems dry weight is down currently weighing 175 pounds at 178 at last visit.  Again he still is a tendency to drop his blood pressures in hemodialysis.  For now I believe his blood pressures alone.  I would defer to nephrology to see if they think he could potentially tolerate some macular reduction with either adding hydralazine or an ACE inhibitor/ARB.

## 2012-10-24 NOTE — Assessment & Plan Note (Signed)
Again he's had improvement of his LDL and HDL on his lipid panel, but his triglycerides remain elevated. My initial plan was to prescribe fenofibrate at somewhere between 45-50 mg daily.  Unfortunately shortly after returning home, he went to the pharmacy in January was a was not able to afford generic fenofibrate as a Tier 4 medication.  There are cards at a discount options out there, but he would prefer to increase his statin.  I am therefore increasing his Lipitor to 40 mg daily. We will followup labs in roughly 6 months.

## 2012-10-24 NOTE — Telephone Encounter (Signed)
Left message to increase Atorvastatin 40 mg a day  Instead of starting fenofibrate.

## 2012-10-24 NOTE — Assessment & Plan Note (Signed)
With sitting relieved by nitroglycerin, and his known coronary disease history.  The fact that he is getting better with isosorbide mononitrate would suggest to me that his symptoms are indeed angina. Thankfully there was no evidence of ischemia on his Myoview, which would suggest microvascular disease. Plan: Continue with Imdur at current dose.  May need to increase if his nitroglycerin requirement increases.  He is currently not interested in pursuing reevaluation with invasive strategy.  Continue dual antiplatelet therapy (aspirin/Plavix), max dose beta blocker.  Continue to treat other risk factors including dyslipidemia/hypercholesterolemia.

## 2012-10-24 NOTE — Assessment & Plan Note (Addendum)
Certainly some of his symptoms are related to diastolic dysfunction.  This explain why he he is improved from a symptom standpoint with lowering his dry weight.  Most of his symptoms were related to postprandial or nocturnal symptoms that have improved with decreased dry weight. Again if his blood pressure were to tolerate it, what I would want to afterload reduction with hydralazine in addition to nitrate.

## 2012-10-24 NOTE — Assessment & Plan Note (Signed)
Stable on dual antiplatelet therapy.  On beta blocker, but not ACE inhibitor due to the unsure utility with ESRD. Due to labile blood pressures with dialysis, we are not using hydralazine plus nitrate.

## 2012-10-30 DIAGNOSIS — E1129 Type 2 diabetes mellitus with other diabetic kidney complication: Secondary | ICD-10-CM | POA: Diagnosis not present

## 2012-11-03 ENCOUNTER — Ambulatory Visit (HOSPITAL_COMMUNITY)
Admission: RE | Admit: 2012-11-03 | Discharge: 2012-11-03 | Disposition: A | Discharge status: Home / Self Care | Payer: Medicare Other | Source: Ambulatory Visit | Attending: Internal Medicine | Admitting: Internal Medicine

## 2012-11-03 DIAGNOSIS — I739 Peripheral vascular disease, unspecified: Secondary | ICD-10-CM | POA: Insufficient documentation

## 2012-11-03 DIAGNOSIS — E119 Type 2 diabetes mellitus without complications: Secondary | ICD-10-CM | POA: Diagnosis not present

## 2012-11-03 DIAGNOSIS — M25559 Pain in unspecified hip: Secondary | ICD-10-CM | POA: Insufficient documentation

## 2012-11-03 DIAGNOSIS — Z992 Dependence on renal dialysis: Secondary | ICD-10-CM | POA: Insufficient documentation

## 2012-11-03 DIAGNOSIS — I70219 Atherosclerosis of native arteries of extremities with intermittent claudication, unspecified extremity: Secondary | ICD-10-CM | POA: Diagnosis not present

## 2012-11-03 NOTE — Progress Notes (Signed)
Arterial Duplex Lower Ext. Completed. Kimbree Casanas, BS, RDMS, RVT  

## 2012-11-07 ENCOUNTER — Telehealth: Payer: Self-pay | Admitting: *Deleted

## 2012-11-07 DIAGNOSIS — N186 End stage renal disease: Secondary | ICD-10-CM | POA: Diagnosis not present

## 2012-11-07 NOTE — Telephone Encounter (Signed)
Spoke to patient. Result given . Verbalized understanding  

## 2012-11-07 NOTE — Telephone Encounter (Signed)
Message copied by Raiford Simmonds on Fri Nov 07, 2012  8:49 AM ------      Message from: Leonie Man      Created: Thu Nov 06, 2012  5:51 PM       Good news, no significant leg artery blockages, just calcified vessels.            Leonie Man, MD       ------

## 2012-11-08 DIAGNOSIS — D509 Iron deficiency anemia, unspecified: Secondary | ICD-10-CM | POA: Diagnosis not present

## 2012-11-08 DIAGNOSIS — N2581 Secondary hyperparathyroidism of renal origin: Secondary | ICD-10-CM | POA: Diagnosis not present

## 2012-11-08 DIAGNOSIS — N186 End stage renal disease: Secondary | ICD-10-CM | POA: Diagnosis not present

## 2012-11-13 ENCOUNTER — Other Ambulatory Visit: Payer: Self-pay

## 2012-12-07 DIAGNOSIS — N186 End stage renal disease: Secondary | ICD-10-CM | POA: Diagnosis not present

## 2012-12-08 DIAGNOSIS — M1610 Unilateral primary osteoarthritis, unspecified hip: Secondary | ICD-10-CM | POA: Diagnosis not present

## 2012-12-08 DIAGNOSIS — M25559 Pain in unspecified hip: Secondary | ICD-10-CM | POA: Diagnosis not present

## 2012-12-09 DIAGNOSIS — N2581 Secondary hyperparathyroidism of renal origin: Secondary | ICD-10-CM | POA: Diagnosis not present

## 2012-12-09 DIAGNOSIS — N186 End stage renal disease: Secondary | ICD-10-CM | POA: Diagnosis not present

## 2012-12-09 DIAGNOSIS — D631 Anemia in chronic kidney disease: Secondary | ICD-10-CM | POA: Diagnosis not present

## 2012-12-10 ENCOUNTER — Encounter: Payer: Self-pay | Admitting: Cardiology

## 2012-12-15 DIAGNOSIS — M1610 Unilateral primary osteoarthritis, unspecified hip: Secondary | ICD-10-CM | POA: Diagnosis not present

## 2012-12-16 ENCOUNTER — Other Ambulatory Visit: Payer: Self-pay | Admitting: Family Medicine

## 2012-12-16 NOTE — Telephone Encounter (Signed)
Rx sent to the pharmacy by e-script.//AB/CMA 

## 2012-12-22 ENCOUNTER — Ambulatory Visit (INDEPENDENT_AMBULATORY_CARE_PROVIDER_SITE_OTHER): Payer: Medicare Other | Admitting: Family Medicine

## 2012-12-22 ENCOUNTER — Encounter: Payer: Self-pay | Admitting: Family Medicine

## 2012-12-22 VITALS — BP 118/68 | HR 57 | Temp 98.3°F | Resp 16 | Wt 175.0 lb

## 2012-12-22 DIAGNOSIS — E1140 Type 2 diabetes mellitus with diabetic neuropathy, unspecified: Secondary | ICD-10-CM

## 2012-12-22 DIAGNOSIS — E1149 Type 2 diabetes mellitus with other diabetic neurological complication: Secondary | ICD-10-CM

## 2012-12-22 DIAGNOSIS — I10 Essential (primary) hypertension: Secondary | ICD-10-CM

## 2012-12-22 DIAGNOSIS — E1142 Type 2 diabetes mellitus with diabetic polyneuropathy: Secondary | ICD-10-CM

## 2012-12-22 DIAGNOSIS — N186 End stage renal disease: Secondary | ICD-10-CM | POA: Diagnosis not present

## 2012-12-22 DIAGNOSIS — E785 Hyperlipidemia, unspecified: Secondary | ICD-10-CM | POA: Diagnosis not present

## 2012-12-22 LAB — HEMOGLOBIN A1C: Hgb A1c MFr Bld: 6.1 % (ref 4.6–6.5)

## 2012-12-22 LAB — BASIC METABOLIC PANEL
Chloride: 96 mEq/L (ref 96–112)
GFR: 14.72 mL/min — CL (ref >60.00)
Potassium: 4.6 mEq/L (ref 3.5–5.1)
Sodium: 134 mEq/L — ABNORMAL LOW (ref 135–145)

## 2012-12-22 LAB — LIPID PANEL
Cholesterol: 90 mg/dL (ref 0–200)
Total CHOL/HDL Ratio: 4
VLDL: 48 mg/dL — ABNORMAL HIGH (ref 0.0–40.0)

## 2012-12-22 LAB — HEPATIC FUNCTION PANEL
AST: 16 U/L (ref 0–37)
Alkaline Phosphatase: 64 U/L (ref 39–117)
Bilirubin, Direct: 0 mg/dL (ref 0.0–0.3)
Total Bilirubin: 0.6 mg/dL (ref 0.3–1.2)

## 2012-12-22 NOTE — Progress Notes (Signed)
Pre visit review using our clinic review tool, if applicable. No additional management support is needed unless otherwise documented below in the visit note. 

## 2012-12-22 NOTE — Assessment & Plan Note (Signed)
Chronic problem.  Maintained on SSI based on CBGs.  UTD on eye exam.  Currently asymptomatic.  Not on ACE/ARB due to HD.  Check labs.  Adjust meds prn

## 2012-12-22 NOTE — Assessment & Plan Note (Signed)
Chronic problem.  Tolerating statin w/out difficulty.  Check labs.  Adjust meds prn  

## 2012-12-22 NOTE — Progress Notes (Signed)
   Subjective:    Patient ID: [name] [name], male    DOB: 1940-01-20, 72 y.o.   MRN: [id number]  HPI DM- chronic problem, on Novolin N BID.  CBGs 'pretty good' except the week pt on prednisone.  Taking 30 units BID but self adjusts based on sugars.  UTD on eye exam.  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  No diarrhea.  Hyperlipidemia- chronic problem, on Lipitor.  Denies abd pain, N/V.  HTN- chronic problem, on Coreg, Terazosin.  Pt continues to have CP- cardiology aware and started pt on Imdur.  No SOB.  HAs.   Review of Systems For ROS see HPI     Objective:   Physical Exam  Vitals reviewed. Constitutional: He is oriented to person, place, and time. He appears well-developed and well-nourished. No distress.  HENT:  Head: Normocephalic and atraumatic.  Eyes: Conjunctivae and EOM are normal. Pupils are equal, round, and reactive to light.  Neck: Normal range of motion. Neck supple. No thyromegaly present.  Cardiovascular: Normal rate, regular rhythm, normal heart sounds and intact distal pulses.   No murmur heard. Pulmonary/Chest: Effort normal and breath sounds normal. No respiratory distress.  Abdominal: Soft. Bowel sounds are normal. He exhibits no distension.  Musculoskeletal: He exhibits no edema.  Lymphadenopathy:    He has no cervical adenopathy.  Neurological: He is alert and oriented to person, place, and time. No cranial nerve deficit.  Skin: Skin is warm and dry.  Psychiatric: He has a normal mood and affect. His behavior is normal.          Assessment & Plan:

## 2012-12-22 NOTE — Assessment & Plan Note (Signed)
Continues HD 3x/week using L arm AV fistula.

## 2012-12-22 NOTE — Patient Instructions (Signed)
Follow up in 3-4 months to recheck diabetes Keep up the good work! We'll notify you of your lab results and make any changes if needed Call with any questions or concerns Happy Holidays!!!

## 2012-12-22 NOTE — Assessment & Plan Note (Signed)
Chronic problem.  Well controlled.  Continues to have CP consistent w/ angina.  Has cards f/u scheduled.  Check labs.  No anticipated med changes.

## 2012-12-23 ENCOUNTER — Other Ambulatory Visit: Payer: Self-pay | Admitting: General Practice

## 2012-12-23 MED ORDER — FENOFIBRATE 160 MG PO TABS: 160.0000 mg | ORAL_TABLET | Freq: Every day | ORAL | Status: DC

## 2013-01-07 DIAGNOSIS — N186 End stage renal disease: Secondary | ICD-10-CM | POA: Diagnosis not present

## 2013-01-10 DIAGNOSIS — N039 Chronic nephritic syndrome with unspecified morphologic changes: Secondary | ICD-10-CM | POA: Diagnosis not present

## 2013-01-10 DIAGNOSIS — D509 Iron deficiency anemia, unspecified: Secondary | ICD-10-CM | POA: Diagnosis not present

## 2013-01-10 DIAGNOSIS — N186 End stage renal disease: Secondary | ICD-10-CM | POA: Diagnosis not present

## 2013-01-10 DIAGNOSIS — D631 Anemia in chronic kidney disease: Secondary | ICD-10-CM | POA: Diagnosis not present

## 2013-01-10 DIAGNOSIS — N2581 Secondary hyperparathyroidism of renal origin: Secondary | ICD-10-CM | POA: Diagnosis not present

## 2013-01-20 ENCOUNTER — Encounter: Payer: Self-pay | Admitting: Family Medicine

## 2013-01-21 ENCOUNTER — Ambulatory Visit (INDEPENDENT_AMBULATORY_CARE_PROVIDER_SITE_OTHER): Payer: Medicare Other | Admitting: Family Medicine

## 2013-01-21 ENCOUNTER — Encounter: Payer: Self-pay | Admitting: Family Medicine

## 2013-01-21 VITALS — BP 140/64 | HR 62 | Temp 98.4°F | Resp 16 | Wt 172.4 lb

## 2013-01-21 DIAGNOSIS — H6690 Otitis media, unspecified, unspecified ear: Secondary | ICD-10-CM

## 2013-01-21 DIAGNOSIS — J069 Acute upper respiratory infection, unspecified: Secondary | ICD-10-CM

## 2013-01-21 MED ORDER — BENZONATATE 200 MG PO CAPS: 200.0000 mg | ORAL_CAPSULE | Freq: Three times a day (TID) | ORAL | Status: DC | PRN

## 2013-01-21 MED ORDER — AMOXICILLIN 875 MG PO TABS: 875.0000 mg | ORAL_TABLET | Freq: Two times a day (BID) | ORAL | Status: DC

## 2013-01-21 NOTE — Assessment & Plan Note (Signed)
New.  L ear.  Start abx.  Cough meds prn.  Reviewed supportive care and red flags that should prompt return.  Pt expressed understanding and is in agreement w/ plan.

## 2013-01-21 NOTE — Patient Instructions (Signed)
Follow up as scheduled Start the Amoxicillin for the ear infection- this will also treat any respiratory illness Drink plenty of fluids Mucinex to thin your congestion Take the Tessalon for cough REST! Hang in there!

## 2013-01-21 NOTE — Progress Notes (Signed)
   Subjective:    Patient ID: RANBIR YOHEY, male    DOB: [birth year], 73 y.o.   MRN: [id number]  HPI URI- sxs started 'over a week ago'.  + sinus drainage, PND, wet cough but not productive.  Intermittent HAs.  + sinus pain.  Bilateral ear fullness.  No fevers.  No N/V/D.  + sick contacts.  Taking Nyquil w/ some relief.   Review of Systems For ROS see HPI     Objective:   Physical Exam  Vitals reviewed. Constitutional: He is oriented to person, place, and time. He appears well-developed and well-nourished. No distress.  HENT:  Head: Normocephalic and atraumatic.  Nose: Nose normal.  Mouth/Throat: Oropharynx is clear and moist. No oropharyngeal exudate.  R TM retracted L TM dull, visible fluid, poor landmarks, erythematous No TTP over frontal or maxillary sinuses  Eyes: Conjunctivae and EOM are normal. Pupils are equal, round, and reactive to light.  Pulmonary/Chest: Effort normal and breath sounds normal. No respiratory distress. He has no wheezes. He has no rales.  Wet, non-productive cough  Lymphadenopathy:    He has no cervical adenopathy.  Neurological: He is alert and oriented to person, place, and time.  Skin: Skin is warm and dry.          Assessment & Plan:

## 2013-01-21 NOTE — Progress Notes (Signed)
Pre visit review using our clinic review tool, if applicable. No additional management support is needed unless otherwise documented below in the visit note. 

## 2013-01-23 ENCOUNTER — Ambulatory Visit (INDEPENDENT_AMBULATORY_CARE_PROVIDER_SITE_OTHER): Payer: Medicare Other | Admitting: Ophthalmology

## 2013-01-23 DIAGNOSIS — I10 Essential (primary) hypertension: Secondary | ICD-10-CM

## 2013-01-23 DIAGNOSIS — H43819 Vitreous degeneration, unspecified eye: Secondary | ICD-10-CM

## 2013-01-23 DIAGNOSIS — E11319 Type 2 diabetes mellitus with unspecified diabetic retinopathy without macular edema: Secondary | ICD-10-CM | POA: Diagnosis not present

## 2013-01-23 DIAGNOSIS — E11311 Type 2 diabetes mellitus with unspecified diabetic retinopathy with macular edema: Secondary | ICD-10-CM | POA: Diagnosis not present

## 2013-01-23 DIAGNOSIS — E1139 Type 2 diabetes mellitus with other diabetic ophthalmic complication: Secondary | ICD-10-CM

## 2013-01-23 DIAGNOSIS — E1165 Type 2 diabetes mellitus with hyperglycemia: Secondary | ICD-10-CM | POA: Diagnosis not present

## 2013-01-23 DIAGNOSIS — H35039 Hypertensive retinopathy, unspecified eye: Secondary | ICD-10-CM

## 2013-01-28 ENCOUNTER — Other Ambulatory Visit: Payer: Self-pay | Admitting: Family Medicine

## 2013-01-28 NOTE — Telephone Encounter (Signed)
Med filled.  

## 2013-01-29 ENCOUNTER — Encounter: Payer: Self-pay | Admitting: Family Medicine

## 2013-01-29 MED ORDER — NITROGLYCERIN 0.4 MG SL SUBL: 0.4000 mg | SUBLINGUAL_TABLET | SUBLINGUAL | Status: DC | PRN

## 2013-01-29 NOTE — Telephone Encounter (Signed)
Med filled.  

## 2013-02-05 DIAGNOSIS — E1129 Type 2 diabetes mellitus with other diabetic kidney complication: Secondary | ICD-10-CM | POA: Diagnosis not present

## 2013-02-05 DIAGNOSIS — N186 End stage renal disease: Secondary | ICD-10-CM | POA: Diagnosis not present

## 2013-02-07 DIAGNOSIS — N186 End stage renal disease: Secondary | ICD-10-CM | POA: Diagnosis not present

## 2013-02-10 DIAGNOSIS — E209 Hypoparathyroidism, unspecified: Secondary | ICD-10-CM | POA: Diagnosis not present

## 2013-02-10 DIAGNOSIS — N186 End stage renal disease: Secondary | ICD-10-CM | POA: Diagnosis not present

## 2013-02-10 DIAGNOSIS — D631 Anemia in chronic kidney disease: Secondary | ICD-10-CM | POA: Diagnosis not present

## 2013-02-10 DIAGNOSIS — N2581 Secondary hyperparathyroidism of renal origin: Secondary | ICD-10-CM | POA: Diagnosis not present

## 2013-02-10 DIAGNOSIS — D509 Iron deficiency anemia, unspecified: Secondary | ICD-10-CM | POA: Diagnosis not present

## 2013-02-16 ENCOUNTER — Telehealth: Payer: Self-pay | Admitting: *Deleted

## 2013-02-16 MED ORDER — ISOSORBIDE MONONITRATE ER 60 MG PO TB24: 60.0000 mg | ORAL_TABLET | Freq: Every day | ORAL | Status: DC

## 2013-02-16 NOTE — Telephone Encounter (Signed)
Returned call and pt verified x 2.  Pt stated he needs to change Imdur b/c it's not lasting long enough.  Stated he is having to take NTG almost daily.  Pt stated he may take 1-3 NTG throughout the day.  Pt denied CP/pressure now.  Denied having CP lasting >15-56mins.  Stated it is usually relieved w/ one NTG, but may return later in the day.  Pt informed Dr. Ellyn Hack will be notified for further instructions.  Pt uses [name].  Message forwarded to Dr. Ellyn Hack.

## 2013-02-16 NOTE — Telephone Encounter (Signed)
Returned call and informed pt per instructions by MD/PA.  Pt verbalized understanding and agreed w/ plan.  Rx sent to pharmacy.

## 2013-02-16 NOTE — Telephone Encounter (Signed)
Increase Imdur to 60 mg; can take 2 tabs of existing 30mg .  Leonie Man, MD

## 2013-02-16 NOTE — Telephone Encounter (Signed)
Pt was calling in regards to a change in medication. He wanted to come see Dr. Ellyn Hack sooner but he does not have anything. I offered him an appointment with the PA, but when I did he stated that he wanted to speak to [facility]. The medication is Imdur.  [name] [name]

## 2013-02-20 ENCOUNTER — Encounter (INDEPENDENT_AMBULATORY_CARE_PROVIDER_SITE_OTHER): Payer: Medicare Other | Admitting: Ophthalmology

## 2013-02-20 DIAGNOSIS — E11311 Type 2 diabetes mellitus with unspecified diabetic retinopathy with macular edema: Secondary | ICD-10-CM

## 2013-02-20 DIAGNOSIS — E11319 Type 2 diabetes mellitus with unspecified diabetic retinopathy without macular edema: Secondary | ICD-10-CM | POA: Diagnosis not present

## 2013-02-20 DIAGNOSIS — I10 Essential (primary) hypertension: Secondary | ICD-10-CM | POA: Diagnosis not present

## 2013-02-20 DIAGNOSIS — E1165 Type 2 diabetes mellitus with hyperglycemia: Secondary | ICD-10-CM

## 2013-02-20 DIAGNOSIS — E1139 Type 2 diabetes mellitus with other diabetic ophthalmic complication: Secondary | ICD-10-CM | POA: Diagnosis not present

## 2013-02-20 DIAGNOSIS — H43819 Vitreous degeneration, unspecified eye: Secondary | ICD-10-CM

## 2013-02-20 DIAGNOSIS — H35039 Hypertensive retinopathy, unspecified eye: Secondary | ICD-10-CM

## 2013-02-22 ENCOUNTER — Inpatient Hospital Stay (HOSPITAL_COMMUNITY)
Admission: EM | Admit: 2013-02-22 | Discharge: 2013-02-24 | DRG: 246 | Disposition: A | Discharge status: Home / Self Care | Payer: Medicare Other | Attending: Internal Medicine | Admitting: Internal Medicine

## 2013-02-22 ENCOUNTER — Emergency Department (HOSPITAL_COMMUNITY): Payer: Medicare Other

## 2013-02-22 ENCOUNTER — Encounter (HOSPITAL_COMMUNITY): Payer: Self-pay | Admitting: Emergency Medicine

## 2013-02-22 DIAGNOSIS — N400 Enlarged prostate without lower urinary tract symptoms: Secondary | ICD-10-CM | POA: Diagnosis present

## 2013-02-22 DIAGNOSIS — I120 Hypertensive chronic kidney disease with stage 5 chronic kidney disease or end stage renal disease: Secondary | ICD-10-CM | POA: Diagnosis not present

## 2013-02-22 DIAGNOSIS — E039 Hypothyroidism, unspecified: Secondary | ICD-10-CM | POA: Diagnosis present

## 2013-02-22 DIAGNOSIS — I214 Non-ST elevation (NSTEMI) myocardial infarction: Principal | ICD-10-CM | POA: Diagnosis present

## 2013-02-22 DIAGNOSIS — I110 Hypertensive heart disease with heart failure: Secondary | ICD-10-CM

## 2013-02-22 DIAGNOSIS — E1149 Type 2 diabetes mellitus with other diabetic neurological complication: Secondary | ICD-10-CM | POA: Diagnosis present

## 2013-02-22 DIAGNOSIS — I25709 Atherosclerosis of coronary artery bypass graft(s), unspecified, with unspecified angina pectoris: Secondary | ICD-10-CM | POA: Diagnosis present

## 2013-02-22 DIAGNOSIS — N186 End stage renal disease: Secondary | ICD-10-CM | POA: Diagnosis not present

## 2013-02-22 DIAGNOSIS — I25810 Atherosclerosis of coronary artery bypass graft(s) without angina pectoris: Secondary | ICD-10-CM | POA: Diagnosis present

## 2013-02-22 DIAGNOSIS — I5032 Chronic diastolic (congestive) heart failure: Secondary | ICD-10-CM | POA: Diagnosis present

## 2013-02-22 DIAGNOSIS — I252 Old myocardial infarction: Secondary | ICD-10-CM | POA: Diagnosis not present

## 2013-02-22 DIAGNOSIS — Z9049 Acquired absence of other specified parts of digestive tract: Secondary | ICD-10-CM

## 2013-02-22 DIAGNOSIS — I200 Unstable angina: Secondary | ICD-10-CM | POA: Diagnosis present

## 2013-02-22 DIAGNOSIS — I440 Atrioventricular block, first degree: Secondary | ICD-10-CM | POA: Diagnosis not present

## 2013-02-22 DIAGNOSIS — R079 Chest pain, unspecified: Secondary | ICD-10-CM | POA: Diagnosis present

## 2013-02-22 DIAGNOSIS — Z7902 Long term (current) use of antithrombotics/antiplatelets: Secondary | ICD-10-CM

## 2013-02-22 DIAGNOSIS — Z85038 Personal history of other malignant neoplasm of large intestine: Secondary | ICD-10-CM

## 2013-02-22 DIAGNOSIS — E1140 Type 2 diabetes mellitus with diabetic neuropathy, unspecified: Secondary | ICD-10-CM | POA: Diagnosis present

## 2013-02-22 DIAGNOSIS — J9819 Other pulmonary collapse: Secondary | ICD-10-CM | POA: Diagnosis not present

## 2013-02-22 DIAGNOSIS — I2510 Atherosclerotic heart disease of native coronary artery without angina pectoris: Secondary | ICD-10-CM | POA: Diagnosis present

## 2013-02-22 DIAGNOSIS — Z8249 Family history of ischemic heart disease and other diseases of the circulatory system: Secondary | ICD-10-CM

## 2013-02-22 DIAGNOSIS — I059 Rheumatic mitral valve disease, unspecified: Secondary | ICD-10-CM | POA: Diagnosis not present

## 2013-02-22 DIAGNOSIS — N2581 Secondary hyperparathyroidism of renal origin: Secondary | ICD-10-CM | POA: Diagnosis not present

## 2013-02-22 DIAGNOSIS — I509 Heart failure, unspecified: Secondary | ICD-10-CM | POA: Diagnosis present

## 2013-02-22 DIAGNOSIS — E785 Hyperlipidemia, unspecified: Secondary | ICD-10-CM | POA: Diagnosis present

## 2013-02-22 DIAGNOSIS — Z981 Arthrodesis status: Secondary | ICD-10-CM

## 2013-02-22 DIAGNOSIS — Z9089 Acquired absence of other organs: Secondary | ICD-10-CM

## 2013-02-22 DIAGNOSIS — I447 Left bundle-branch block, unspecified: Secondary | ICD-10-CM | POA: Diagnosis present

## 2013-02-22 DIAGNOSIS — I5033 Acute on chronic diastolic (congestive) heart failure: Secondary | ICD-10-CM | POA: Diagnosis not present

## 2013-02-22 DIAGNOSIS — Z79899 Other long term (current) drug therapy: Secondary | ICD-10-CM

## 2013-02-22 DIAGNOSIS — E1169 Type 2 diabetes mellitus with other specified complication: Secondary | ICD-10-CM | POA: Diagnosis present

## 2013-02-22 DIAGNOSIS — D631 Anemia in chronic kidney disease: Secondary | ICD-10-CM | POA: Diagnosis present

## 2013-02-22 DIAGNOSIS — M545 Low back pain, unspecified: Secondary | ICD-10-CM | POA: Diagnosis present

## 2013-02-22 DIAGNOSIS — I10 Essential (primary) hypertension: Secondary | ICD-10-CM | POA: Diagnosis present

## 2013-02-22 DIAGNOSIS — I6529 Occlusion and stenosis of unspecified carotid artery: Secondary | ICD-10-CM | POA: Diagnosis present

## 2013-02-22 DIAGNOSIS — J811 Chronic pulmonary edema: Secondary | ICD-10-CM

## 2013-02-22 DIAGNOSIS — M109 Gout, unspecified: Secondary | ICD-10-CM | POA: Diagnosis present

## 2013-02-22 DIAGNOSIS — N039 Chronic nephritic syndrome with unspecified morphologic changes: Secondary | ICD-10-CM | POA: Diagnosis present

## 2013-02-22 DIAGNOSIS — R072 Precordial pain: Secondary | ICD-10-CM | POA: Diagnosis not present

## 2013-02-22 DIAGNOSIS — Z9861 Coronary angioplasty status: Secondary | ICD-10-CM

## 2013-02-22 DIAGNOSIS — Z992 Dependence on renal dialysis: Secondary | ICD-10-CM

## 2013-02-22 DIAGNOSIS — G8929 Other chronic pain: Secondary | ICD-10-CM | POA: Diagnosis present

## 2013-02-22 DIAGNOSIS — E1142 Type 2 diabetes mellitus with diabetic polyneuropathy: Secondary | ICD-10-CM | POA: Diagnosis present

## 2013-02-22 DIAGNOSIS — I2582 Chronic total occlusion of coronary artery: Secondary | ICD-10-CM | POA: Diagnosis present

## 2013-02-22 DIAGNOSIS — I5030 Unspecified diastolic (congestive) heart failure: Secondary | ICD-10-CM

## 2013-02-22 DIAGNOSIS — Z794 Long term (current) use of insulin: Secondary | ICD-10-CM

## 2013-02-22 DIAGNOSIS — Z91013 Allergy to seafood: Secondary | ICD-10-CM

## 2013-02-22 DIAGNOSIS — Z833 Family history of diabetes mellitus: Secondary | ICD-10-CM

## 2013-02-22 DIAGNOSIS — Z8673 Personal history of transient ischemic attack (TIA), and cerebral infarction without residual deficits: Secondary | ICD-10-CM

## 2013-02-22 DIAGNOSIS — Z7982 Long term (current) use of aspirin: Secondary | ICD-10-CM

## 2013-02-22 DIAGNOSIS — Z8711 Personal history of peptic ulcer disease: Secondary | ICD-10-CM

## 2013-02-22 LAB — CBC
HEMATOCRIT: 33 % — AB (ref 39.0–52.0)
Hemoglobin: 10.7 g/dL — ABNORMAL LOW (ref 13.0–17.0)
MCH: 30.3 pg (ref 26.0–34.0)
MCHC: 32.4 g/dL (ref 30.0–36.0)
MCV: 93.5 fL (ref 78.0–100.0)
Platelets: 271 10*3/uL (ref 150–400)
RBC: 3.53 MIL/uL — ABNORMAL LOW (ref 4.22–5.81)
RDW: 13.9 % (ref 11.5–15.5)
WBC: 7.8 10*3/uL (ref 4.0–10.5)

## 2013-02-22 LAB — BASIC METABOLIC PANEL
BUN: 35 mg/dL — ABNORMAL HIGH (ref 6–23)
CHLORIDE: 99 meq/L (ref 96–112)
CO2: 29 mEq/L (ref 19–32)
Calcium: 9.5 mg/dL (ref 8.4–10.5)
Creatinine, Ser: 4.01 mg/dL — ABNORMAL HIGH (ref 0.50–1.35)
GFR calc Af Amer: 16 mL/min — ABNORMAL LOW (ref >90)
GFR, EST NON AFRICAN AMERICAN: 14 mL/min — AB (ref >90)
GLUCOSE: 143 mg/dL — AB (ref 70–99)
POTASSIUM: 4.1 meq/L (ref 3.7–5.3)
Sodium: 143 mEq/L (ref 137–147)

## 2013-02-22 LAB — PRO B NATRIURETIC PEPTIDE: Pro B Natriuretic peptide (BNP): 3970 pg/mL — ABNORMAL HIGH (ref 0–125)

## 2013-02-22 LAB — POCT I-STAT TROPONIN I: TROPONIN I, POC: 0.03 ng/mL (ref 0.00–0.08)

## 2013-02-22 MED ORDER — ASPIRIN 81 MG PO CHEW
324.0000 mg | CHEWABLE_TABLET | Freq: Once | ORAL | Status: AC
  Administered 2013-02-22: 324 mg via ORAL
  Filled 2013-02-22: qty 4

## 2013-02-22 MED ORDER — NITROGLYCERIN 2 % TD OINT
1.0000 [in_us] | TOPICAL_OINTMENT | Freq: Once | TRANSDERMAL | Status: AC
  Administered 2013-02-22: 1 [in_us] via TOPICAL
  Filled 2013-02-22: qty 1

## 2013-02-22 MED ORDER — FUROSEMIDE 10 MG/ML IJ SOLN: 40.0000 mg | Freq: Once | INTRAMUSCULAR | Status: DC

## 2013-02-22 NOTE — ED Provider Notes (Signed)
TIME SEEN: 9:36 PM  CHIEF COMPLAINT: Chest pain  HPI: Patient is a 73 year old male with history of hypertension, diabetes, hyperlipidemia, cardiac disease status post CABG and stent his cardiologist is Dr. Ellyn Hack who presents the emergency department with 2 weeks of shortness of breath with exertion and substernal chest pressure that radiates into bilateral shoulders and into his jaw that started tonight while at rest. He reports his pain resolved with nitroglycerin tablets at home but is now back. Denies any fever or cough. Denies any lower extremity swelling. He has a history of end-stage renal disease and was dialyzed yesterday.  He denies any associated nausea, vomiting diaphoresis or dizziness. He states that with his prior anginal episodes he had sharp chest pain.  PCP is Dr. Annye Asa Nephrologist is Dr. Albertine Patricia  ROS: See HPI Constitutional: no fever  Eyes: no drainage  ENT: no runny nose   Cardiovascular:  chest pain  Resp:  SOB  GI: no vomiting GU: no dysuria Integumentary: no rash  Allergy: no hives  Musculoskeletal: no leg swelling  Neurological: no slurred speech ROS otherwise negative  PAST MEDICAL HISTORY/PAST SURGICAL HISTORY:  Past Medical History  Diagnosis Date  . Diabetes mellitus   . Hyperlipidemia   . Hypertension   . Gout   . Hypothyroidism     On supplementation  . LBBB (left bundle branch block)     Chronic  . History of colon cancer 2003    colectomy for CA. no recurrence . never required  radiation or chem  . Anemia     Likely secondary to history of GI bleed  . Stroke 1998  . End stage renal disease on dialysis     Dialyzes at [facility]: Tues/Th/Sat - left upper cavity AV fistula brachiocephalic  . CAD in native artery; and the grafts 1992, 1997, 2002, 2006, 2012    CABG 1992 and redo CABG 2006  . CAD (coronary artery disease) of bypass graft 2006, 2012    In 2006: Occluded SVG-OM noted (SVG-diagonal was previously occluded);  2012: Severe lesion in redo SVG-OM1 --> BMS PCI   . CAD S/P percutaneous coronary angioplasty October 2012    Non-STEMI: PCI to SVG-OM1 Integrity BMS 3.5 mm x 15 mm (3.85 mm)  . History of nuclear stress test Sept 2014    EF 39% (down from) 45%, no ischemia; but now the inferobasal perfusion defect is determined to be likley prior infarction (as opposed to diaphragmatic attenuation)  . Echocardiogram findings abnormal, without diagnosis [id number]; Oct 2014    EF 50-55%, anteroseptal hypokinesis. Mild to moderate concentric LVH. Mild MR, aortic sclerosis. Mild atrial dilatation bilaterally.; Oct - mild LVH, EF ~50-55%, incoordinate septal WM, - with no other notable WMA  . History of Non-ST elevated myocardial infarction (non-STEMI) 1992, 2006, 2012  . Peptic ulcer disease  2004  . BPH (benign prostatic hyperplasia)   . Chronic low back pain     MEDICATIONS:  Prior to Admission medications   Medication Sig Start Date End Date Taking? Authorizing Provider  allopurinol (ZYLOPRIM) 100 MG tablet Take 100 mg by mouth daily.   Yes Historical Provider, MD  aspirin 81 MG chewable tablet Chew 81 mg by mouth at bedtime.   Yes Historical Provider, MD  atorvastatin (LIPITOR) 40 MG tablet Take 40 mg by mouth at bedtime.   Yes Historical Provider, MD  Besifloxacin HCl (BESIVANCE) 0.6 % SUSP Place 1 drop into both eyes See admin instructions. Use eye drops 4 times daily for  2 days following injection by Dr. Zigmund Daniel (once a month)   Yes Historical Provider, MD  calcium acetate (PHOSLO) 667 MG capsule Take 667 mg by mouth See admin instructions. Take 1 capsule (667 mg) with meals and with snacks   Yes Historical Provider, MD  carvedilol (COREG) 25 MG tablet Take 25 mg by mouth 2 (two) times daily with a meal.   Yes Historical Provider, MD  clopidogrel (PLAVIX) 75 MG tablet Take 1 tablet (75 mg total) by mouth daily. 03/17/12  Yes Midge Minium, MD  fenofibrate 160 MG tablet Take 1 tablet (160 mg total) by  mouth daily. 12/23/12  Yes Midge Minium, MD  insulin NPH Human (NOVOLIN N) 100 UNIT/ML injection Inject 20 Units into the skin 2 (two) times daily before a meal.   Yes Historical Provider, MD  isosorbide mononitrate (IMDUR) 30 MG 24 hr tablet Take 60 mg by mouth daily.   Yes Historical Provider, MD  levothyroxine (SYNTHROID, LEVOTHROID) 125 MCG tablet Take 125 mcg by mouth daily before breakfast.   Yes Historical Provider, MD  lidocaine-prilocaine (EMLA) cream Apply 1 application topically as needed (prior to dialysis treatments).   Yes Historical Provider, MD  loperamide (IMODIUM) 2 MG capsule Take 2 mg by mouth 4 (four) times daily as needed for diarrhea or loose stools.    Yes Historical Provider, MD  multivitamin (RENA-VIT) TABS tablet Take 1 tablet by mouth daily. 12/17/11  Yes Midge Minium, MD  nitroGLYCERIN (NITROSTAT) 0.4 MG SL tablet Place 0.4 mg under the tongue every 5 (five) minutes as needed for chest pain.   Yes Historical Provider, MD  temazepam (RESTORIL) 15 MG capsule Take 15 mg by mouth at bedtime as needed for sleep.   Yes Historical Provider, MD  terazosin (HYTRIN) 2 MG capsule Take 2 mg by mouth at bedtime.   Yes Historical Provider, MD  glucose blood (PRODIGY NO CODING BLOOD GLUC) test strip Test as directed 12/17/11   Midge Minium, MD    ALLERGIES:  Allergies  Allergen Reactions  . Shellfish Allergy     Causes gout flare-ups    SOCIAL HISTORY:  History  Substance Use Topics  . Smoking status: Never Smoker   . Smokeless tobacco: Never Used  . Alcohol Use: No    FAMILY HISTORY: Family History  Problem Relation Age of Onset  . Heart disease Mother   . Hypertension Mother   . Diabetes Mother   . Heart disease Father   . Hypertension Father   . Diabetes Father     EXAM: BP 190/58  Pulse 76  Temp(Src) 98.1 F (36.7 C) (Oral)  Resp 18  Wt 170 lb (77.111 kg)  SpO2 97% CONSTITUTIONAL: Alert and oriented and responds appropriately to  questions. Well-appearing; well-nourished HEAD: Normocephalic EYES: Conjunctivae clear, PERRL ENT: normal nose; no rhinorrhea; moist mucous membranes; pharynx without lesions noted NECK: Supple, no meningismus, no LAD  CARD: RRR; S1 and S2 appreciated; no murmurs, no clicks, no rubs, no gallops RESP: Normal chest excursion without splinting or tachypnea; breath sounds clear and equal bilaterally; no wheezes, no rhonchi, no rales,  ABD/GI: Normal bowel sounds; non-distended; soft, non-tender, no rebound, no guarding BACK:  The back appears normal and is non-tender to palpation, there is no CVA tenderness EXT: Normal ROM in all joints; non-tender to palpation; no edema; normal capillary refill; no cyanosis    SKIN: Normal color for age and race; warm NEURO: Moves all extremities equally PSYCH: The patient's mood and  manner are appropriate. Grooming and personal hygiene are appropriate.  MEDICAL DECISION MAKING: Patient here with chest pain with significant cardiac history. Will obtain cardiac labs, BMP, chest x-ray. We'll give nitroglycerin paste, aspirin. Patient will need admission.  ED PROGRESS: Patient is chest pain-free after nitroglycerin paste. His troponin is negative. EKG shows an old left bundle branch block, does not meet Sgarbossa criteria. His chest x-ray does show mild pulmonary edema but he has no hypoxia or respiratory distress. He was last dialyzed yesterday. He reports he only urinates a small amount once a day.  D/w hospitalist for admission.   EKG Interpretation    Date/Time:  Sunday February 22 2013 20:43:39 EST Ventricular Rate:  75 PR Interval:  194 QRS Duration: 152 QT Interval:  432 QTC Calculation: 482 R Axis:   51 Text Interpretation:  Normal sinus rhythm Left bundle branch block Abnormal ECG No significant change since last tracing Confirmed by Willodene Stallings  DO, Eldredge Veldhuizen [id number]) on 02/22/2013 10:48:39 PM               Elizabeth, DO 02/22/13 2251

## 2013-02-22 NOTE — ED Notes (Signed)
C/o tightness to center of chest and sob with exertion x 2 weeks.  Took 3 NTG within 1 hour tonight with no relief of pain.  Denies nausea/vomiting.

## 2013-02-22 NOTE — H&P (Signed)
PCP: Annye Asa, MD  Cardiology [facility]   Chief Complaint:  Chest pain  HPI: [name] [name] is a 73 y.o. male   has a past medical history of Diabetes mellitus; Hyperlipidemia; Hypertension; Gout; Hypothyroidism; LBBB (left bundle branch block); History of colon cancer (2003); Anemia; Stroke (1998); End stage renal disease on dialysis; CAD in native artery; and the grafts (1992, 1997, 2002, 2006, 2012); CAD (coronary artery disease) of bypass graft (2006, 2012); CAD S/P percutaneous coronary angioplasty (October 2012); History of nuclear stress test (Sept 2014); Echocardiogram findings abnormal, without diagnosis [id number]; Oct 2014); History of Non-ST elevated myocardial infarction (non-STEMI) (1992, 2006, 2012); Peptic ulcer disease ( 2004); BPH (benign prostatic hyperplasia); and Chronic low back pain.   Presented with  2-3 week hx of chest pain usually worse with exertion he  was supposed to follow up Next week, Imdur was increased from 30 mg to 60 mg. The pain did not seem to improve. Pain felt like steady pressure over upper chest radiating to the neck. This pain i different from his MI pain. 2 years ago he needed to have stent placement at that time his pain was simmilar.  Usually nitroglycerine would help but not today. This episode lasted about 7 hours and was finely relieved when he had nitro patch placed. Denies any nausea or vomiting , trop negative, he has hx of LBBB. Currently chest pain free. Last HD ws on Saturday he gets dialysed on Tuesday, Thursday and Saturday.  Patient states he is able to make urine once a day   Review of Systems:    Pertinent positives include: chest pain, nausea,   Constitutional:  No weight loss, night sweats, Fevers, chills, fatigue, weight loss  HEENT:  No headaches, Difficulty swallowing,Tooth/dental problems,Sore throat,  No sneezing, itching, ear ache, nasal congestion, post nasal drip,  Cardio-vascular:  No Orthopnea,  PND, anasarca, dizziness, palpitations.no Bilateral lower extremity swelling  GI:  No heartburn, indigestion, abdominal pain, vomiting, diarrhea, change in bowel habits, loss of appetite, melena, blood in stool, hematemesis Resp:  no shortness of breath at rest. No dyspnea on exertion, No excess mucus, no productive cough, No non-productive cough, No coughing up of blood.No change in color of mucus.No wheezing. Skin:  no rash or lesions. No jaundice GU:  no dysuria, change in color of urine, no urgency or frequency. No straining to urinate.  No flank pain.  Musculoskeletal:  No joint pain or no joint swelling. No decreased range of motion. No back pain.  Psych:  No change in mood or affect. No depression or anxiety. No memory loss.  Neuro: no localizing neurological complaints, no tingling, no weakness, no double vision, no gait abnormality, no slurred speech, no confusion  Otherwise ROS are negative except for above, 10 systems were reviewed  Past Medical History: Past Medical History  Diagnosis Date  . Diabetes mellitus   . Hyperlipidemia   . Hypertension   . Gout   . Hypothyroidism     On supplementation  . LBBB (left bundle branch block)     Chronic  . History of colon cancer 2003    colectomy for CA. no recurrence . never required  radiation or chem  . Anemia     Likely secondary to history of GI bleed  . Stroke 1998  . End stage renal disease on dialysis     Dialyzes at [facility]: Tues/Th/Sat - left upper cavity AV fistula brachiocephalic  . CAD in native artery; and the  grafts 1992, 1997, 2002, 2006, 2012    CABG 1992 and redo CABG 2006  . CAD (coronary artery disease) of bypass graft 2006, 2012    In 2006: Occluded SVG-OM noted (SVG-diagonal was previously occluded); 2012: Severe lesion in redo SVG-OM1 --> BMS PCI   . CAD S/P percutaneous coronary angioplasty October 2012    Non-STEMI: PCI to SVG-OM1 Integrity BMS 3.5 mm x 15 mm (3.85 mm)  . History of nuclear  stress test Sept 2014    EF 39% (down from) 45%, no ischemia; but now the inferobasal perfusion defect is determined to be likley prior infarction (as opposed to diaphragmatic attenuation)  . Echocardiogram findings abnormal, without diagnosis [id number]; Oct 2014    EF 50-55%, anteroseptal hypokinesis. Mild to moderate concentric LVH. Mild MR, aortic sclerosis. Mild atrial dilatation bilaterally.; Oct - mild LVH, EF ~50-55%, incoordinate septal WM, - with no other notable WMA  . History of Non-ST elevated myocardial infarction (non-STEMI) 1992, 2006, 2012  . Peptic ulcer disease  2004  . BPH (benign prostatic hyperplasia)   . Chronic low back pain    Past Surgical History  Procedure Laterality Date  . Appendectomy    . Cholecystectomy  2009  . Eye surgery      bilateral cataracts   . Spinal fusion    . Av fistula repair Left     5/11  . Colon resection      transverse and proximal descending w/ primar anastomosis  . Doppler echocardiography  01/25/2012    EF 50 to 55%  . Nm myocar perf wall motion  10/12/2011    EF 54%,LV normal ; no signifiant ischemia  . Cardiac catheterization  10/16/2010    patent  LIMA to the LAD , PATENT  svg TO 2nd marginals ,occluded PLA with collaterals and SVG to the OM  had 90% stenosis  was txlge  bare - metal  3.5  Integrity ten  postdilate  36-37  mm    . Cardiac catheterization  03/22/2004    loss of both SVG,severe disease in prox and ostial  circ not amenable to invention. mod diease distal LAD  AFTER BYPASS GRAFT;-CVTS to evaluate   . Coronary angioplasty  04/03/1995    OM and Circ  . Coronary angioplasty  02/01/2000    [facility]  . Carotid doppler  05/23/2012    ABN CAROTID-- RGT BULB/PROX ICA mild to mod 50-60%;lft bulb/prox mild to mod 0-49%;left subclavian abn waveforms consistent with patients lft arm A/V fistula  . Event monitor  01/23/2012-02/06/2012    SINUS ,LBBB,unifocal PVCs  . Coronary artery bypass graft  08/22/1990    INITIAL:LIMA to LAD,  SVG to  Diagonal, SVG to OM  . Coronary artery bypass graft  2006    SVG to OM1,SVG to OM2 with patent LIMA  to LAD and occluded vein  graft to the diagonal  and occluded vein grft to OM from  prior  surgery     Medications: Prior to Admission medications   Medication Sig Start Date End Date Taking? Authorizing Provider  allopurinol (ZYLOPRIM) 100 MG tablet Take 100 mg by mouth daily.   Yes Historical Provider, MD  aspirin 81 MG chewable tablet Chew 81 mg by mouth at bedtime.   Yes Historical Provider, MD  atorvastatin (LIPITOR) 40 MG tablet Take 40 mg by mouth at bedtime.   Yes Historical Provider, MD  Besifloxacin HCl (BESIVANCE) 0.6 % SUSP Place 1 drop into both eyes See admin instructions. Use eye  drops 4 times daily for 2 days following injection by Dr. Zigmund Daniel (once a month)   Yes Historical Provider, MD  calcium acetate (PHOSLO) 667 MG capsule Take 667 mg by mouth See admin instructions. Take 1 capsule (667 mg) with meals and with snacks   Yes Historical Provider, MD  carvedilol (COREG) 25 MG tablet Take 25 mg by mouth 2 (two) times daily with a meal.   Yes Historical Provider, MD  clopidogrel (PLAVIX) 75 MG tablet Take 1 tablet (75 mg total) by mouth daily. 03/17/12  Yes Midge Minium, MD  fenofibrate 160 MG tablet Take 1 tablet (160 mg total) by mouth daily. 12/23/12  Yes Midge Minium, MD  insulin NPH Human (NOVOLIN N) 100 UNIT/ML injection Inject 20 Units into the skin 2 (two) times daily before a meal.   Yes Historical Provider, MD  isosorbide mononitrate (IMDUR) 30 MG 24 hr tablet Take 60 mg by mouth daily.   Yes Historical Provider, MD  levothyroxine (SYNTHROID, LEVOTHROID) 125 MCG tablet Take 125 mcg by mouth daily before breakfast.   Yes Historical Provider, MD  lidocaine-prilocaine (EMLA) cream Apply 1 application topically as needed (prior to dialysis treatments).   Yes Historical Provider, MD  loperamide (IMODIUM) 2 MG capsule Take 2 mg by mouth 4 (four) times daily  as needed for diarrhea or loose stools.    Yes Historical Provider, MD  multivitamin (RENA-VIT) TABS tablet Take 1 tablet by mouth daily. 12/17/11  Yes Midge Minium, MD  nitroGLYCERIN (NITROSTAT) 0.4 MG SL tablet Place 0.4 mg under the tongue every 5 (five) minutes as needed for chest pain.   Yes Historical Provider, MD  temazepam (RESTORIL) 15 MG capsule Take 15 mg by mouth at bedtime as needed for sleep.   Yes Historical Provider, MD  terazosin (HYTRIN) 2 MG capsule Take 2 mg by mouth at bedtime.   Yes Historical Provider, MD  glucose blood (PRODIGY NO CODING BLOOD GLUC) test strip Test as directed 12/17/11   Midge Minium, MD    Allergies:   Allergies  Allergen Reactions  . Shellfish Allergy     Causes gout flare-ups    Social History:  Ambulatory  independently   Lives at   home   reports that he has never smoked. He has never used smokeless tobacco. He reports that he does not drink alcohol or use illicit drugs.   Family History: family history includes Diabetes in his father and mother; Heart disease in his father and mother; Hypertension in his father and mother.    Physical Exam: Patient Vitals for the past 24 hrs:  BP Temp Temp src Pulse Resp SpO2 Weight  02/22/13 2044 190/58 mmHg 98.1 F (36.7 C) Oral 76 18 97 % 77.111 kg (170 lb)    1. General:  in No Acute distress 2. Psychological: Alert and   Oriented 3. Head/ENT:   Moist   Mucous Membranes                          Head Non traumatic, neck supple                          Normal   Dentition 4. SKIN: normal  Skin turgor,  Skin clean Dry and intact no rash 5. Heart: Regular rate and rhythm no Murmur, Rub or gallop 6. Lungs:   no wheezes or crackles   7. Abdomen: Soft, non-tender, Non  distended 8. Lower extremities: no clubbing, cyanosis, or edema 9. Neurologically Grossly intact, moving all 4 extremities equally 10. MSK: Normal range of motion  body mass index is 28.29 kg/(m^2).   Labs on  Admission:   Recent Labs  02/22/13 2050  NA 143  K 4.1  CL 99  CO2 29  GLUCOSE 143*  BUN 35*  CREATININE 4.01*  CALCIUM 9.5   No results found for this basename: AST, ALT, ALKPHOS, BILITOT, PROT, ALBUMIN,  in the last 72 hours No results found for this basename: LIPASE, AMYLASE,  in the last 72 hours  Recent Labs  02/22/13 2050  WBC 7.8  HGB 10.7*  HCT 33.0*  MCV 93.5  PLT 271   No results found for this basename: CKTOTAL, CKMB, CKMBINDEX, TROPONINI,  in the last 72 hours No results found for this basename: TSH, T4TOTAL, FREET3, T3FREE, THYROIDAB,  in the last 72 hours No results found for this basename: VITAMINB12, FOLATE, FERRITIN, TIBC, IRON, RETICCTPCT,  in the last 72 hours Lab Results  Component Value Date   HGBA1C 6.1 12/22/2012    The CrCl is unknown because both a height and weight (above a minimum accepted value) are required for this calculation. ABG    Component Value Date/Time   PHART 7.365 11/15/2006 1347   HCO3 20.2 11/15/2006 1347   TCO2 21 11/15/2006 1347   ACIDBASEDEF 5.0* 11/15/2006 1347   O2SAT 92.0 11/15/2006 1347     No results found for this basename: DDIMER     Other results:  I have pearsonaly reviewed this: ECG REPORT  Rate: 75  Rhythm: LBBB ST&T Change: N/A    Cultures:    Component Value Date/Time   SDES BLOOD HAND RIGHT 10/12/2010 1024   SPECREQUEST BOTTLES DRAWN AEROBIC ONLY 10CC  10/12/2010 1024   CULT NO GROWTH 5 DAYS 10/12/2010 1024   REPTSTATUS 10/18/2010 FINAL 10/12/2010 1024       Radiological Exams on Admission: Dg Chest Port 1 View  02/22/2013   CLINICAL DATA:  Chest pain  EXAM: PORTABLE CHEST - 1 VIEW  COMPARISON:  DG CHEST 2 VIEW dated 05/02/2011  FINDINGS: Prior CABG. Vascular congestion. Perihilar and lower lobe opacities may reflect mild edema. Linear atelectasis in the lingula. No effusions. No acute bony abnormality. Mild cardiomegaly.  IMPRESSION: Borderline cardiomegaly with vascular congestion and  perihilar/lower lobe opacities. Concerning for mild edema/CHF.   Electronically Signed   By: Rolm Baptise M.D.   On: 02/22/2013 22:22    Chart has been reviewed  Assessment/Plan  73 yo M with hx of CAD and microvascular angina here with unstable angina  Present on Admission:  . Unstable angina - heparin gtt, spoke to Dr. Radford Pax who agrees with heparin for now and recommends cardiology consult in AM.  . CAD (coronary artery disease) of bypass graft - PCI to SVG-OM with BMS - continue aspirin, plavix, heparin gtt. Cardiology consult in AM . Diabetes mellitus with neuropathy - SSI decrease humalin  insulin while NPO . ESRD (end stage renal disease) - will leave a msg for Nephrology about patient being here, mild fluid overload may need HD sooner, patient only makes minimal Urine.  Marland Kitchen HYPERTENSION - continue home meds betablocker  Prophylaxis  Lovenox, Protonix  CODE STATUS: FULL CODE  Other plan as per orders.  I have spent a total of 55 min on this admission  Ileen Kahre 02/22/2013, 11:02 PM

## 2013-02-22 NOTE — ED Notes (Signed)
Pt has been going on for several weeks but has been relieved by nitro. For the past week pt has been taking more than 2 nitroglycerin tablets at home for each episode. Pain now not relieved by nitro.

## 2013-02-23 ENCOUNTER — Encounter (HOSPITAL_COMMUNITY): Payer: Self-pay | Admitting: *Deleted

## 2013-02-23 ENCOUNTER — Encounter (HOSPITAL_COMMUNITY)
Admission: EM | Disposition: A | Discharge status: Home / Self Care | Payer: Medicare Other | Source: Home / Self Care | Attending: Internal Medicine

## 2013-02-23 DIAGNOSIS — I2510 Atherosclerotic heart disease of native coronary artery without angina pectoris: Secondary | ICD-10-CM | POA: Diagnosis not present

## 2013-02-23 DIAGNOSIS — E1149 Type 2 diabetes mellitus with other diabetic neurological complication: Secondary | ICD-10-CM | POA: Diagnosis not present

## 2013-02-23 DIAGNOSIS — N186 End stage renal disease: Secondary | ICD-10-CM | POA: Diagnosis not present

## 2013-02-23 DIAGNOSIS — I120 Hypertensive chronic kidney disease with stage 5 chronic kidney disease or end stage renal disease: Secondary | ICD-10-CM | POA: Diagnosis not present

## 2013-02-23 DIAGNOSIS — I214 Non-ST elevation (NSTEMI) myocardial infarction: Secondary | ICD-10-CM | POA: Diagnosis not present

## 2013-02-23 DIAGNOSIS — N2581 Secondary hyperparathyroidism of renal origin: Secondary | ICD-10-CM | POA: Diagnosis not present

## 2013-02-23 DIAGNOSIS — I059 Rheumatic mitral valve disease, unspecified: Secondary | ICD-10-CM

## 2013-02-23 DIAGNOSIS — D631 Anemia in chronic kidney disease: Secondary | ICD-10-CM | POA: Diagnosis not present

## 2013-02-23 DIAGNOSIS — R079 Chest pain, unspecified: Secondary | ICD-10-CM | POA: Diagnosis not present

## 2013-02-23 DIAGNOSIS — I5033 Acute on chronic diastolic (congestive) heart failure: Secondary | ICD-10-CM | POA: Diagnosis not present

## 2013-02-23 HISTORY — PX: PERCUTANEOUS CORONARY STENT INTERVENTION (PCI-S): SHX6016

## 2013-02-23 HISTORY — PX: TRANSTHORACIC ECHOCARDIOGRAM: SHX275

## 2013-02-23 HISTORY — PX: LEFT HEART CATHETERIZATION WITH CORONARY ANGIOGRAM: SHX5451

## 2013-02-23 LAB — GLUCOSE, CAPILLARY
GLUCOSE-CAPILLARY: 115 mg/dL — AB (ref 70–99)
GLUCOSE-CAPILLARY: 119 mg/dL — AB (ref 70–99)
Glucose-Capillary: 111 mg/dL — ABNORMAL HIGH (ref 70–99)
Glucose-Capillary: 112 mg/dL — ABNORMAL HIGH (ref 70–99)
Glucose-Capillary: 98 mg/dL (ref 70–99)

## 2013-02-23 LAB — HEPARIN LEVEL (UNFRACTIONATED)
HEPARIN UNFRACTIONATED: 2 [IU]/mL — AB (ref 0.30–0.70)
HEPARIN UNFRACTIONATED: 0.23 [IU]/mL — AB (ref 0.30–0.70)

## 2013-02-23 LAB — CBC
HCT: 32.4 % — ABNORMAL LOW (ref 39.0–52.0)
HCT: 33.2 % — ABNORMAL LOW (ref 39.0–52.0)
Hemoglobin: 10.8 g/dL — ABNORMAL LOW (ref 13.0–17.0)
Hemoglobin: 10.8 g/dL — ABNORMAL LOW (ref 13.0–17.0)
MCH: 31 pg (ref 26.0–34.0)
MCH: 30.4 pg (ref 26.0–34.0)
MCHC: 33.3 g/dL (ref 30.0–36.0)
MCHC: 32.5 g/dL (ref 30.0–36.0)
MCV: 93.1 fL (ref 78.0–100.0)
MCV: 93.5 fL (ref 78.0–100.0)
PLATELETS: 274 10*3/uL (ref 150–400)
Platelets: 275 10*3/uL (ref 150–400)
RBC: 3.48 MIL/uL — AB (ref 4.22–5.81)
RBC: 3.55 MIL/uL — ABNORMAL LOW (ref 4.22–5.81)
RDW: 13.9 % (ref 11.5–15.5)
RDW: 13.8 % (ref 11.5–15.5)
WBC: 8.5 10*3/uL (ref 4.0–10.5)
WBC: 8.2 10*3/uL (ref 4.0–10.5)

## 2013-02-23 LAB — BASIC METABOLIC PANEL
BUN: 40 mg/dL — ABNORMAL HIGH (ref 6–23)
CHLORIDE: 102 meq/L (ref 96–112)
CO2: 25 meq/L (ref 19–32)
Calcium: 9.3 mg/dL (ref 8.4–10.5)
Creatinine, Ser: 4.1 mg/dL — ABNORMAL HIGH (ref 0.50–1.35)
GFR calc Af Amer: 15 mL/min — ABNORMAL LOW (ref >90)
GFR calc non Af Amer: 13 mL/min — ABNORMAL LOW (ref >90)
Glucose, Bld: 114 mg/dL — ABNORMAL HIGH (ref 70–99)
Potassium: 3.9 mEq/L (ref 3.7–5.3)
SODIUM: 142 meq/L (ref 137–147)

## 2013-02-23 LAB — HEMOGLOBIN A1C
Hgb A1c MFr Bld: 5.4 % (ref <5.7)
Mean Plasma Glucose: 108 mg/dL (ref <117)

## 2013-02-23 LAB — PROTIME-INR
INR: 1.04 (ref 0.00–1.49)
PROTHROMBIN TIME: 13.4 s (ref 11.6–15.2)

## 2013-02-23 LAB — TROPONIN I
Troponin I: 1.29 ng/mL (ref <0.30)
Troponin I: 1.65 ng/mL (ref <0.30)
Troponin I: 2 ng/mL (ref <0.30)

## 2013-02-23 LAB — LIPID PANEL
Cholesterol: 114 mg/dL (ref 0–200)
HDL: 28 mg/dL — ABNORMAL LOW (ref >39)
LDL Cholesterol: 32 mg/dL (ref 0–99)
Total CHOL/HDL Ratio: 4.1 RATIO
Triglycerides: 268 mg/dL — ABNORMAL HIGH (ref <150)
VLDL: 54 mg/dL — AB (ref 0–40)

## 2013-02-23 LAB — APTT: aPTT: 72 seconds — ABNORMAL HIGH (ref 24–37)

## 2013-02-23 LAB — TSH: TSH: 3.828 u[IU]/mL (ref 0.350–4.500)

## 2013-02-23 LAB — POCT ACTIVATED CLOTTING TIME
ACTIVATED CLOTTING TIME: 609 s
ACTIVATED CLOTTING TIME: 287 s

## 2013-02-23 SURGERY — LEFT HEART CATHETERIZATION WITH CORONARY ANGIOGRAM
Anesthesia: LOCAL

## 2013-02-23 MED ORDER — HEPARIN (PORCINE) IN NACL 2-0.9 UNIT/ML-% IJ SOLN
INTRAMUSCULAR | Status: AC
  Filled 2013-02-23: qty 1000

## 2013-02-23 MED ORDER — ATORVASTATIN CALCIUM 40 MG PO TABS
40.0000 mg | ORAL_TABLET | Freq: Every day | ORAL | Status: DC
  Administered 2013-02-23 (×2): 40 mg via ORAL
  Filled 2013-02-23 (×4): qty 1

## 2013-02-23 MED ORDER — MIDAZOLAM HCL 2 MG/2ML IJ SOLN
INTRAMUSCULAR | Status: AC
Start: 2013-02-23 — End: 2013-02-23
  Filled 2013-02-23: qty 2

## 2013-02-23 MED ORDER — ADENOSINE 12 MG/4ML IV SOLN
16.0000 mL | Freq: Once | INTRAVENOUS | Status: DC
  Filled 2013-02-23: qty 16

## 2013-02-23 MED ORDER — CALCIUM ACETATE 667 MG PO CAPS
667.0000 mg | ORAL_CAPSULE | Freq: Three times a day (TID) | ORAL | Status: DC
  Administered 2013-02-24: 667 mg via ORAL
  Filled 2013-02-23 (×7): qty 1

## 2013-02-23 MED ORDER — NITROGLYCERIN 0.2 MG/ML ON CALL CATH LAB
INTRAVENOUS | Status: AC
  Filled 2013-02-23: qty 1

## 2013-02-23 MED ORDER — CLOPIDOGREL BISULFATE 75 MG PO TABS
75.0000 mg | ORAL_TABLET | Freq: Every day | ORAL | Status: DC
  Administered 2013-02-24: 75 mg via ORAL
  Filled 2013-02-23: qty 1

## 2013-02-23 MED ORDER — LIDOCAINE HCL (PF) 1 % IJ SOLN
INTRAMUSCULAR | Status: AC
Start: 2013-02-23 — End: 2013-02-23
  Filled 2013-02-23: qty 30

## 2013-02-23 MED ORDER — ISOSORBIDE MONONITRATE ER 60 MG PO TB24
60.0000 mg | ORAL_TABLET | Freq: Every day | ORAL | Status: DC
  Administered 2013-02-23 – 2013-02-24 (×2): 60 mg via ORAL
  Filled 2013-02-23 (×2): qty 1

## 2013-02-23 MED ORDER — CARVEDILOL 25 MG PO TABS
25.0000 mg | ORAL_TABLET | Freq: Two times a day (BID) | ORAL | Status: DC
  Administered 2013-02-23 – 2013-02-24 (×3): 25 mg via ORAL
  Filled 2013-02-23 (×5): qty 1

## 2013-02-23 MED ORDER — MORPHINE SULFATE 2 MG/ML IJ SOLN: 1.0000 mg | INTRAMUSCULAR | Status: DC | PRN

## 2013-02-23 MED ORDER — ASPIRIN EC 81 MG PO TBEC
81.0000 mg | DELAYED_RELEASE_TABLET | Freq: Every day | ORAL | Status: DC
  Administered 2013-02-23 – 2013-02-24 (×2): 81 mg via ORAL
  Filled 2013-02-23 (×2): qty 1

## 2013-02-23 MED ORDER — HYDRALAZINE HCL 20 MG/ML IJ SOLN
10.0000 mg | Freq: Once | INTRAMUSCULAR | Status: AC
  Administered 2013-02-24: 10 mg via INTRAVENOUS
  Filled 2013-02-23: qty 1

## 2013-02-23 MED ORDER — TEMAZEPAM 15 MG PO CAPS: 15.0000 mg | ORAL_CAPSULE | Freq: Every evening | ORAL | Status: DC | PRN

## 2013-02-23 MED ORDER — ASPIRIN 81 MG PO CHEW: 81.0000 mg | CHEWABLE_TABLET | ORAL | Status: DC

## 2013-02-23 MED ORDER — FENTANYL CITRATE 0.05 MG/ML IJ SOLN
INTRAMUSCULAR | Status: AC
  Filled 2013-02-23: qty 2

## 2013-02-23 MED ORDER — CLOPIDOGREL BISULFATE 75 MG PO TABS
75.0000 mg | ORAL_TABLET | Freq: Every day | ORAL | Status: DC
  Administered 2013-02-23: 75 mg via ORAL
  Filled 2013-02-23: qty 1

## 2013-02-23 MED ORDER — ALLOPURINOL 100 MG PO TABS
100.0000 mg | ORAL_TABLET | Freq: Every day | ORAL | Status: DC
  Administered 2013-02-23 – 2013-02-24 (×2): 100 mg via ORAL
  Filled 2013-02-23 (×2): qty 1

## 2013-02-23 MED ORDER — NITROGLYCERIN 2 % TD OINT
0.5000 [in_us] | TOPICAL_OINTMENT | Freq: Four times a day (QID) | TRANSDERMAL | Status: DC
  Administered 2013-02-23 (×3): 0.5 [in_us] via TOPICAL
  Filled 2013-02-23: qty 30

## 2013-02-23 MED ORDER — INSULIN NPH (HUMAN) (ISOPHANE) 100 UNIT/ML ~~LOC~~ SUSP
7.0000 [IU] | Freq: Two times a day (BID) | SUBCUTANEOUS | Status: DC
  Filled 2013-02-23: qty 10

## 2013-02-23 MED ORDER — SODIUM CHLORIDE 0.9 % IJ SOLN: 3.0000 mL | Freq: Two times a day (BID) | INTRAMUSCULAR | Status: DC

## 2013-02-23 MED ORDER — NITROGLYCERIN 0.4 MG SL SUBL
SUBLINGUAL_TABLET | SUBLINGUAL | Status: AC
  Administered 2013-02-23: 0.4 mg
  Administered 2013-02-23: 02:00:00
  Filled 2013-02-23: qty 25

## 2013-02-23 MED ORDER — HEPARIN BOLUS VIA INFUSION
3000.0000 [IU] | Freq: Once | INTRAVENOUS | Status: AC
  Administered 2013-02-23: 3000 [IU] via INTRAVENOUS
  Filled 2013-02-23: qty 3000

## 2013-02-23 MED ORDER — INSULIN ASPART 100 UNIT/ML ~~LOC~~ SOLN: 0.0000 [IU] | SUBCUTANEOUS | Status: DC

## 2013-02-23 MED ORDER — LEVOTHYROXINE SODIUM 125 MCG PO TABS
125.0000 ug | ORAL_TABLET | Freq: Every day | ORAL | Status: DC
  Administered 2013-02-23 – 2013-02-24 (×2): 125 ug via ORAL
  Filled 2013-02-23 (×3): qty 1

## 2013-02-23 MED ORDER — SODIUM CHLORIDE 0.9 % IV SOLN
125.0000 mg | INTRAVENOUS | Status: DC
  Administered 2013-02-24: 125 mg via INTRAVENOUS
  Filled 2013-02-23 (×2): qty 10

## 2013-02-23 MED ORDER — BIVALIRUDIN 250 MG IV SOLR
INTRAVENOUS | Status: AC
Start: 2013-02-23 — End: 2013-02-23
  Filled 2013-02-23: qty 250

## 2013-02-23 MED ORDER — DOXERCALCIFEROL 4 MCG/2ML IV SOLN
1.0000 ug | INTRAVENOUS | Status: DC
  Administered 2013-02-24: 1 ug via INTRAVENOUS
  Filled 2013-02-23: qty 2

## 2013-02-23 MED ORDER — FENOFIBRATE 160 MG PO TABS
160.0000 mg | ORAL_TABLET | Freq: Every day | ORAL | Status: DC
  Administered 2013-02-23 – 2013-02-24 (×2): 160 mg via ORAL
  Filled 2013-02-23 (×2): qty 1

## 2013-02-23 MED ORDER — SODIUM CHLORIDE 0.9 % IV SOLN
INTRAVENOUS | Status: DC
  Administered 2013-02-23: 20:00:00 via INTRAVENOUS

## 2013-02-23 MED ORDER — ASPIRIN 81 MG PO CHEW: 81.0000 mg | CHEWABLE_TABLET | Freq: Every day | ORAL | Status: DC

## 2013-02-23 MED ORDER — ASPIRIN 81 MG PO CHEW
81.0000 mg | CHEWABLE_TABLET | Freq: Every day | ORAL | Status: DC
Start: 2013-02-23 — End: 2013-02-23

## 2013-02-23 MED ORDER — CALCIUM ACETATE 667 MG PO CAPS
667.0000 mg | ORAL_CAPSULE | ORAL | Status: DC | PRN
  Filled 2013-02-23: qty 1

## 2013-02-23 MED ORDER — SODIUM CHLORIDE 0.9 % IJ SOLN: 3.0000 mL | INTRAMUSCULAR | Status: DC | PRN

## 2013-02-23 MED ORDER — DARBEPOETIN ALFA-POLYSORBATE 25 MCG/0.42ML IJ SOLN
6.2500 ug | INTRAMUSCULAR | Status: DC
  Administered 2013-02-24: 6.5476 ug via INTRAVENOUS
  Filled 2013-02-23: qty 0.42

## 2013-02-23 MED ORDER — CLOPIDOGREL BISULFATE 300 MG PO TABS
ORAL_TABLET | ORAL | Status: AC
  Filled 2013-02-23: qty 1

## 2013-02-23 MED ORDER — SODIUM CHLORIDE 0.9 % IV SOLN: 250.0000 mL | INTRAVENOUS | Status: DC | PRN

## 2013-02-23 MED ORDER — HEPARIN (PORCINE) IN NACL 100-0.45 UNIT/ML-% IJ SOLN
1100.0000 [IU]/h | INTRAMUSCULAR | Status: DC
  Administered 2013-02-23: 1000 [IU]/h via INTRAVENOUS
  Filled 2013-02-23 (×2): qty 250

## 2013-02-23 MED ORDER — TERAZOSIN HCL 2 MG PO CAPS
2.0000 mg | ORAL_CAPSULE | Freq: Every day | ORAL | Status: DC
  Administered 2013-02-23 (×2): 2 mg via ORAL
  Filled 2013-02-23 (×4): qty 1

## 2013-02-23 NOTE — Progress Notes (Signed)
Night hospitalist coverage notified of episode of chest pain and resolution via text page. Second troponin called to hospitalist, returned call. Cardiology to see pt this morning. No further chest pain.

## 2013-02-23 NOTE — Progress Notes (Addendum)
ANTICOAGULATION CONSULT NOTE - Follow Up Consult  Pharmacy Consult for Heparin Indication: chest pain/ACS  Allergies  Allergen Reactions  . Shellfish Allergy     Causes gout flare-ups    Patient Measurements: Height: 5\' 5"  (165.1 cm) Weight: 172 lb 3.2 oz (78.109 kg) IBW/kg (Calculated) : 61.5 Heparin Dosing Weight: 77.2 kg  Vital Signs: Temp: 98.8 F (37.1 C) (02/16 0512) Temp src: Oral (02/16 0512) BP: 171/65 mmHg (02/16 0512) Pulse Rate: 64 (02/16 0512)  Labs:  Recent Labs  02/22/13 2050 02/23/13 0440 02/23/13 0635 02/23/13 0855  HGB 10.7* 10.8*  --  10.8*  HCT 33.0* 32.4*  --  33.2*  PLT 271 275  --  274  APTT  --  72*  --   --   LABPROT  --  13.4  --   --   INR  --  1.04  --   --   HEPARINUNFRC  --   --   --  2.00*  CREATININE 4.01* 4.10*  --   --   TROPONINI  --  1.29* 1.65*  --     Estimated Creatinine Clearance: 15.9 ml/min (by C-G formula based on Cr of 4.1).   Medications:  Infusions:  . heparin 1,000 Units/hr (02/23/13 0114)    Assessment: 73 y/o male with ESRD on a heparin drip for NSTEMI. Plan is for cath today. Heparin level drawn this morning is >2 but was drawn above the heparin infusion (confirmed with patient) and therefore is not accurate. I spoke with lab who is drawing another heparin level below the heparin infusion. Unfortunately lab cannot draw labs from the L arm as his HD fistula is on that side. No bleeding noted, CBC is stable this morning.  Goal of Therapy:  Heparin level 0.3-0.7 units/ml Monitor platelets by anticoagulation protocol: Yes   Plan:  -Await STAT heparin level -For now, continue heparin drip at 1000 units/hr  [facility], [place].D., BCPS Clinical Pharmacist Pager: [phone] 02/23/2013 11:04 AM   Addendum: Repeat heparin level is subtherapeutic at 0.23 on 1000 units/hr. No bleeding noted.  Increase heparin drip to 1100 units/hr. Will f/u after cath.  [place], Pharm.D., BCPS Clinical  Pharmacist Pager: [phone] 02/23/2013 12:29 PM

## 2013-02-23 NOTE — Progress Notes (Signed)
Echocardiogram 2D Echocardiogram has been performed.  [name] 02/23/2013, 10:44 AM

## 2013-02-23 NOTE — Progress Notes (Addendum)
[facility] PROGRESS NOTE  [name] [name] [id number] DOB: 09/05/1940 DOA: 02/22/2013 PCP: [name] Asa, MD  Assessment/Plan: 1. NSTEMI -cycle cardiac enzymes -continue [name]/plavix/coreg -continue IV heparin -on nitro paste -cards consulted last pm, will need LHC -h/o PCI/stenting in 2012, had low risk myoview in 9/14  2. CAD, s/p CABG, PCI -see above  3. ESRD on HD, TTS -will notify renal  4. DM -continue Insulin at lower dose since NPO   5. HTN -continue coreg/imdur  DVT proph: on IV heparin now  Code Status:Full Code Family Communication: none at bedside Disposition Plan: keep on tele   Consultants:  Cards pending  Renal  HPI/Subjective: Feels better, still with some chest pressure, but improved Some dyspnea  Objective: Filed Vitals:   02/23/13 0512  BP: 171/65  Pulse: 64  Temp: 98.8 F (37.1 C)  Resp: 16    Intake/Output Summary (Last 24 hours) at 02/23/13 0753 Last data filed at 02/23/13 0600  Gross per 24 hour  Intake  47.67 ml  Output    100 ml  Net -52.33 ml   Filed Weights   02/22/13 2044 02/23/13 0029  Weight: 77.111 kg (170 lb) 78.109 kg (172 lb 3.2 oz)    Exam:   General:  AAOx3, no distress  Cardiovascular: S1S2/RRR  Respiratory: CTAB  Abdomen: soft, mildly distended, BS present  Musculoskeletal: no edema c/c  Data Reviewed: Basic Metabolic Panel:  Recent Labs Lab 02/22/13 2050 02/23/13 0440  NA 143 142  K 4.1 3.9  CL 99 102  CO2 29 25  GLUCOSE 143* 114*  BUN 35* 40*  CREATININE 4.01* 4.10*  CALCIUM 9.5 9.3   Liver Function Tests: No results found for this basename: AST, ALT, ALKPHOS, BILITOT, PROT, ALBUMIN,  in the last 168 hours No results found for this basename: LIPASE, AMYLASE,  in the last 168 hours No results found for this basename: AMMONIA,  in the last 168 hours CBC:  Recent Labs Lab 02/22/13 2050 02/23/13 0440  WBC 7.8 8.5  HGB 10.7* 10.8*  HCT 33.0* 32.4*  MCV 93.5 93.1  PLT  271 275   Cardiac Enzymes:  Recent Labs Lab 02/23/13 0440 02/23/13 0635  TROPONINI 1.29* 1.65*   BNP (last 3 results)  Recent Labs  02/22/13 2050  PROBNP 3970.0*   CBG:  Recent Labs Lab 02/23/13 0119 02/23/13 0509  GLUCAP 115* 119*    No results found for this or any previous visit (from the past 240 hour(s)).   Studies: Dg Chest Port 1 View  02/22/2013   CLINICAL DATA:  Chest pain  EXAM: PORTABLE CHEST - 1 VIEW  COMPARISON:  DG CHEST 2 VIEW dated 05/02/2011  FINDINGS: Prior CABG. Vascular congestion. Perihilar and lower lobe opacities may reflect mild edema. Linear atelectasis in the lingula. No effusions. No acute bony abnormality. Mild cardiomegaly.  IMPRESSION: Borderline cardiomegaly with vascular congestion and perihilar/lower lobe opacities. Concerning for mild edema/CHF.   Electronically Signed   By: Rolm Baptise M.D.   On: 02/22/2013 22:22    Scheduled Meds: . allopurinol  100 mg Oral Daily  . aspirin  81 mg Oral QHS  . aspirin EC  81 mg Oral Daily  . atorvastatin  40 mg Oral QHS  . calcium acetate  667 mg Oral TID WC  . carvedilol  25 mg Oral BID WC  . clopidogrel  75 mg Oral Daily  . fenofibrate  160 mg Oral Daily  . insulin aspart  0-9 Units Subcutaneous 6 times per  day  . insulin NPH Human  7 Units Subcutaneous BID AC & HS  . isosorbide mononitrate  60 mg Oral Daily  . levothyroxine  125 mcg Oral QAC breakfast  . nitroGLYCERIN  0.5 inch Topical 4 times per day  . terazosin  2 mg Oral QHS   Continuous Infusions: . heparin 1,000 Units/hr (02/23/13 0114)    Active Problems:   Diabetes mellitus with neuropathy   HYPERTENSION   CHEST PAIN   CAD (coronary artery disease) of bypass graft - PCI to SVG-OM with BMS   ESRD (end stage renal disease)   Unstable angina    Time spent: 58min    [name] [name]  [facility] Pager [phone]. If 7PM-7AM, please contact night-coverage at [email or web address], password [facility] 02/23/2013, 7:53 AM  LOS: 1 day

## 2013-02-23 NOTE — Consult Note (Signed)
Indication for Consultation:  Management of ESRD/hemodialysis; anemia, hypertension/volume and secondary hyperparathyroidism  HPI: [name] [name] is a 73 y.o. male who was admitted Sunday for chest pain. He recieves HD TTS @ [place], with a history of chronic angina, CAD, CABG X2 with stents, HTN, BPH, colon cancer. He had been experiencing angina for the past month, relieved with nitro which he had been taking daily, however the pain would eventually return. The pain was described as a heavy pressure in his midchest, it was at his baseline and nonradiating without any other associated symptoms. Last week he talked with his cardiologist, Dr Ellyn Hack, and his Imdur was increased to 60mg . The chest pain on Sunday did not respond to nitro and was not getting better so he presented to the ED. Currently his is feeling much better, has not had any chest pain since about 2am when the pain resolved with nitro. Troponin is elevated, his is for cardiac cath today- per cards.   Past Medical History  Diagnosis Date  . Diabetes mellitus   . Hyperlipidemia   . Hypertension   . Gout   . Hypothyroidism     On supplementation  . LBBB (left bundle branch block)     Chronic  . History of colon cancer 2003    colectomy for CA. no recurrence . never required  radiation or chem  . Anemia     Likely secondary to history of GI bleed  . Stroke 1998  . End stage renal disease on dialysis     Dialyzes at [facility]: Tues/Th/Sat - left upper cavity AV fistula brachiocephalic  . CAD in native artery; and the grafts 1992, 1997, 2002, 2006, 2012    CABG 1992 and redo CABG 2006  . CAD (coronary artery disease) of bypass graft 2006, 2012    In 2006: Occluded SVG-OM noted (SVG-diagonal was previously occluded); 2012: Severe lesion in redo SVG-OM1 --> BMS PCI   . CAD S/P percutaneous coronary angioplasty October 2012    Non-STEMI: PCI to SVG-OM1 Integrity BMS 3.5 mm x 15 mm (3.85 mm)  . History of nuclear stress test  Sept 2014    EF 39% (down from) 45%, no ischemia; but now the inferobasal perfusion defect is determined to be likley prior infarction (as opposed to diaphragmatic attenuation)  . Echocardiogram findings abnormal, without diagnosis [id number]; Oct 2014    EF 50-55%, anteroseptal hypokinesis. Mild to moderate concentric LVH. Mild MR, aortic sclerosis. Mild atrial dilatation bilaterally.; Oct - mild LVH, EF ~50-55%, incoordinate septal WM, - with no other notable WMA  . History of Non-ST elevated myocardial infarction (non-STEMI) 1992, 2006, 2012  . Peptic ulcer disease  2004  . BPH (benign prostatic hyperplasia)   . Chronic low back pain    Past Surgical History  Procedure Laterality Date  . Appendectomy    . Cholecystectomy  2009  . Eye surgery      bilateral cataracts   . Spinal fusion    . Av fistula repair Left     5/11  . Colon resection      transverse and proximal descending w/ primar anastomosis  . Doppler echocardiography  01/25/2012    EF 50 to 55%  . Nm myocar perf wall motion  10/12/2011    EF 54%,LV normal ; no signifiant ischemia  . Cardiac catheterization  10/16/2010    patent  LIMA to the LAD , PATENT  svg TO 2nd marginals ,occluded PLA with collaterals and SVG to the OM  had 90% stenosis  was txlge  bare - metal  3.5  Integrity ten  postdilate  36-37  mm    . Cardiac catheterization  03/22/2004    loss of both SVG,severe disease in prox and ostial  circ not amenable to invention. mod diease distal LAD  AFTER BYPASS GRAFT;-CVTS to evaluate   . Coronary angioplasty  04/03/1995    OM and Circ  . Coronary angioplasty  02/01/2000    [facility]  . Carotid doppler  05/23/2012    ABN CAROTID-- RGT BULB/PROX ICA mild to mod 50-60%;lft bulb/prox mild to mod 0-49%;left subclavian abn waveforms consistent with patients lft arm A/V fistula  . Event monitor  01/23/2012-02/06/2012    SINUS ,LBBB,unifocal PVCs  . Coronary artery bypass graft  08/22/1990    INITIAL:LIMA to LAD, SVG to   Diagonal, SVG to OM  . Coronary artery bypass graft  2006    SVG to OM1,SVG to OM2 with patent LIMA  to LAD and occluded vein  graft to the diagonal  and occluded vein grft to OM from  prior  surgery   Family History  Problem Relation Age of Onset  . Heart disease Mother   . Hypertension Mother   . Diabetes Mother   . Heart disease Father   . Hypertension Father   . Diabetes Father    Social History:  reports that he has never smoked. He has never used smokeless tobacco. He reports that he does not drink alcohol or use illicit drugs. Allergies  Allergen Reactions  . Shellfish Allergy     Causes gout flare-ups   Prior to Admission medications   Medication Sig Start Date End Date Taking? Authorizing Provider  allopurinol (ZYLOPRIM) 100 MG tablet Take 100 mg by mouth daily.   Yes Historical Provider, MD  aspirin 81 MG chewable tablet Chew 81 mg by mouth at bedtime.   Yes Historical Provider, MD  atorvastatin (LIPITOR) 40 MG tablet Take 40 mg by mouth at bedtime.   Yes Historical Provider, MD  Besifloxacin HCl (BESIVANCE) 0.6 % SUSP Place 1 drop into both eyes See admin instructions. Use eye drops 4 times daily for 2 days following injection by Dr. Zigmund Daniel (once a month)   Yes Historical Provider, MD  calcium acetate (PHOSLO) 667 MG capsule Take 667 mg by mouth See admin instructions. Take 1 capsule (667 mg) with meals and with snacks   Yes Historical Provider, MD  carvedilol (COREG) 25 MG tablet Take 25 mg by mouth 2 (two) times daily with a meal.   Yes Historical Provider, MD  clopidogrel (PLAVIX) 75 MG tablet Take 1 tablet (75 mg total) by mouth daily. 03/17/12  Yes Midge Minium, MD  fenofibrate 160 MG tablet Take 1 tablet (160 mg total) by mouth daily. 12/23/12  Yes Midge Minium, MD  insulin NPH Human (NOVOLIN N) 100 UNIT/ML injection Inject 20 Units into the skin 2 (two) times daily before a meal.   Yes Historical Provider, MD  isosorbide mononitrate (IMDUR) 30 MG 24 hr  tablet Take 60 mg by mouth daily.   Yes Historical Provider, MD  levothyroxine (SYNTHROID, LEVOTHROID) 125 MCG tablet Take 125 mcg by mouth daily before breakfast.   Yes Historical Provider, MD  lidocaine-prilocaine (EMLA) cream Apply 1 application topically as needed (prior to dialysis treatments).   Yes Historical Provider, MD  loperamide (IMODIUM) 2 MG capsule Take 2 mg by mouth 4 (four) times daily as needed for diarrhea or loose stools.  Yes Historical Provider, MD  multivitamin (RENA-VIT) TABS tablet Take 1 tablet by mouth daily. 12/17/11  Yes Midge Minium, MD  nitroGLYCERIN (NITROSTAT) 0.4 MG SL tablet Place 0.4 mg under the tongue every 5 (five) minutes as needed for chest pain.   Yes Historical Provider, MD  temazepam (RESTORIL) 15 MG capsule Take 15 mg by mouth at bedtime as needed for sleep.   Yes Historical Provider, MD  terazosin (HYTRIN) 2 MG capsule Take 2 mg by mouth at bedtime.   Yes Historical Provider, MD  glucose blood (PRODIGY NO CODING BLOOD GLUC) test strip Test as directed 12/17/11   Midge Minium, MD   Current Facility-Administered Medications  Medication Dose Route Frequency Provider Last Rate Last Dose  . allopurinol (ZYLOPRIM) tablet 100 mg  100 mg Oral Daily Toy Baker, MD      . aspirin chewable tablet 81 mg  81 mg Oral QHS Toy Baker, MD      . aspirin EC tablet 81 mg  81 mg Oral Daily Toy Baker, MD      . atorvastatin (LIPITOR) tablet 40 mg  40 mg Oral QHS Toy Baker, MD   40 mg at 02/23/13 0115  . calcium acetate (PHOSLO) capsule 667 mg  667 mg Oral TID WC Toy Baker, MD      . calcium acetate (PHOSLO) capsule 667 mg  667 mg Oral PRN Toy Baker, MD      . carvedilol (COREG) tablet 25 mg  25 mg Oral BID WC Toy Baker, MD   25 mg at 02/23/13 0559  . clopidogrel (PLAVIX) tablet 75 mg  75 mg Oral Daily Toy Baker, MD      . fenofibrate tablet 160 mg  160 mg Oral Daily Toy Baker, MD       . heparin ADULT infusion 100 units/mL (25000 units/250 mL)  1,000 Units/hr Intravenous Continuous Rogue Bussing, [facility] 10 mL/hr at 02/23/13 0114 1,000 Units/hr at 02/23/13 0114  . insulin aspart (novoLOG) injection 0-9 Units  0-9 Units Subcutaneous 6 times per day Toy Baker, MD      . insulin NPH Human (HUMULIN N,NOVOLIN N) injection 7 Units  7 Units Subcutaneous BID AC & HS Toy Baker, MD      . isosorbide mononitrate (IMDUR) 24 hr tablet 60 mg  60 mg Oral Daily Toy Baker, MD      . levothyroxine (SYNTHROID, LEVOTHROID) tablet 125 mcg  125 mcg Oral QAC breakfast Toy Baker, MD   125 mcg at 02/23/13 0559  . nitroGLYCERIN (NITROGLYN) 2 % ointment 0.5 inch  0.5 inch Topical 4 times per day Toy Baker, MD   0.5 inch at 02/23/13 0556  . temazepam (RESTORIL) capsule 15 mg  15 mg Oral QHS PRN Toy Baker, MD      . terazosin (HYTRIN) capsule 2 mg  2 mg Oral QHS Toy Baker, MD   2 mg at 02/23/13 0116   Labs: Basic Metabolic Panel:  Recent Labs Lab 02/22/13 2050 02/23/13 0440  NA 143 142  K 4.1 3.9  CL 99 102  CO2 29 25  GLUCOSE 143* 114*  BUN 35* 40*  CREATININE 4.01* 4.10*  CALCIUM 9.5 9.3   Liver Function Tests: No results found for this basename: AST, ALT, ALKPHOS, BILITOT, PROT, ALBUMIN,  in the last 168 hours No results found for this basename: LIPASE, AMYLASE,  in the last 168 hours No results found for this basename: AMMONIA,  in the last 168 hours CBC:  Recent  Labs Lab 02/22/13 2050 02/23/13 0440 02/23/13 0855  WBC 7.8 8.5 8.2  HGB 10.7* 10.8* 10.8*  HCT 33.0* 32.4* 33.2*  MCV 93.5 93.1 93.5  PLT 271 275 274   Cardiac Enzymes:  Recent Labs Lab 02/23/13 0440 02/23/13 0635  TROPONINI 1.29* 1.65*   CBG:  Recent Labs Lab 02/23/13 0119 02/23/13 0509 02/23/13 0804  GLUCAP 115* 119* 111*   Iron Studies: No results found for this basename: IRON, TIBC, TRANSFERRIN, FERRITIN,  in the last 72  hours Studies/Results: Dg Chest Port 1 View  02/22/2013   CLINICAL DATA:  Chest pain  EXAM: PORTABLE CHEST - 1 VIEW  COMPARISON:  DG CHEST 2 VIEW dated 05/02/2011  FINDINGS: Prior CABG. Vascular congestion. Perihilar and lower lobe opacities may reflect mild edema. Linear atelectasis in the lingula. No effusions. No acute bony abnormality. Mild cardiomegaly.  IMPRESSION: Borderline cardiomegaly with vascular congestion and perihilar/lower lobe opacities. Concerning for mild edema/CHF.   Electronically Signed   By: Rolm Baptise M.D.   On: 02/22/2013 22:22    ROS:  Review of Systems: Gen: Denies any fever, chills, sweats, anorexia, fatigue, weakness, malaise, weight loss, and sleep disorder HEENT: No visual complaints,  Normal external appearance No Epistaxis or Sore throat. No sinusitis.  Diabetic retinopathy CV: Denies chest pain (non since 2am), angina, palpitations, syncope, orthopnea, PND, peripheral edema, and claudication. Resp: Denies dyspnea at rest, dyspnea with exercise, cough, sputum, wheezing, coughing up blood, and pleurisy. GI: Denies vomiting blood, jaundice, and fecal incontinence.   Denies dysphagia or odynophagia. GU : Denies urinary burning, blood in urine, urinary frequency, urinary hesitancy, nocturnal urination, and urinary incontinence.  MS: Denies joint pain, limitation of movement, and swelling, stiffness, low back pain, extremity pain. Denies muscle weakness, cramps, atrophy.  No use of non steroidal antiinflammatory drugs. Derm: Denies rash, itching, dry skin, hives, moles, warts, or unhealing ulcers.  Psych: Denies depression, anxiety, memory loss, suicidal ideation, hallucinations, paranoia, and confusion. Heme: Denies bruising, bleeding, and enlarged lymph nodes. Neuro: No headache.  No diplopia. No dysarthria.  No dysphasia.  No history of CVA.  No Seizures. No paresthesias.  No weakness. Endocrine   No Thyroid disease.  No Adrenal disease.  Physical Exam: Filed  Vitals:   02/23/13 0215 02/23/13 0224 02/23/13 0240 02/23/13 0512  BP: 181/69 189/65 173/60 171/65  Pulse: 79 80 72 64  Temp: 98.4 F (36.9 C)   98.8 F (37.1 C)  TempSrc: Oral   Oral  Resp: 16   16  Height:      Weight:      SpO2: 100% 100% 99% 100%     General: Well developed, well nourished, in no acute distress. Sitting on side of bed. Appears tired Head: Normocephalic, atraumatic, sclera non-icteric, mucus membranes are moist Neck: Supple. JVD slightly elevated. Lungs: Clear bilaterally to auscultation without wheezes, rales, or rhonchi, dim bases. Breathing is unlabored. Heart: RRR with S1 S2. No murmurs, rubs, or gallops appreciated. Abdomen: Soft, non-tender, non-distended with normoactive bowel sounds. No rebound/guarding. No obvious abdominal masses. M-S:  Strength and tone appear normal for age. Lower extremities:without edema or ischemic changes, no open wounds  Neuro: Alert and oriented X 3. Moves all extremities spontaneously. Psych:  Responds to questions appropriately with a normal affect. Dialysis Access: LUA AVF +bruit/thrill  Dialysis Orders:  TTS @ [place].  76 kgs   2K/2.25Ca Heparin 4000u QHD  LUA AVF  350/800  Hectrol 5mcg IV/HD Epogen 1200 Units IV Q week Venofer  100mg   Q HD x10 (stop 2/26) Profile 4   Assessment/Plan: 1. NSTEMI- Cardiac cath pending today, cardiac enzymes cyclying (last troponin 1.65), IV heparin gtt, nitro paste.- Cards consulted by primary.  EKg- SR with 1st degree AV block, LBBB 2.  ESRD -  TTS @ [place]. Last HD Saturday. Slight volume overload. May need lower edw at DC- thinks he has lost wt. HD pending post cath today, then get on schedule tomorrow 3.  Hypertension/volume  - 171/65 coreg and imdur. Mild edema, no effusion on chest xray 4.  Anemia  - hgb 10.8. Continue ESA and venofer (until 2/26, Tsat 17 on 1/29) 5.  Metabolic bone disease -  Cont phoslo with meals, and hectorol. phos 3.2/PTH 60 (1/29) 6.  Nutrition - currently NPO,  will need renal diet and mulitvitamin 7. DM- novolog and NPH, per primary.  Shelle Iron, NP [facility] Beeper [phone] 02/23/2013, 10:08 AM  Renal attending, Stable and plans for cath today as articulated above. Next HD Tuesday. Danyeal Akens C

## 2013-02-23 NOTE — Consult Note (Signed)
CARDIOLOGY CONSULT NOTE  Patient ID: [name] [name] MRN: [id number] DOB/AGE: 1940/10/07 73 y.o.  Admit date: 02/22/2013 Primary Physician: Dr Birdie Riddle Primary Cardiologist: Dr Ellyn Hack Reason for Consultation: NSTEMI  HPI:  73 yo with history of CABG in 1992, redo CABG in 2006, and BMS to SVG-OM1 in 10/12 also with ESRD presented with NSTEMI.  Patient has had chronic stable angina.  However, for the last several weeks, he has been having more chest pain and using more NTG.  He has been having daily chest pain with just walking around in his house.  NTG had been working to stop the pain.  Dr. Ellyn Hack increased his Imdur to 60 but this did not seem to help. Yesterday, he took out the trash can and developed his typical substernal aching but more severe.  He took NTG x 2, each time the NTG would temporarily help but the pain would return.  He came to the ER.  Pain eventually resolved on NTG patch.  It lasted total for several hours.  It returned early this morning but resolved with NTG again.  Troponin was initially negative but increased to 1.65.  He is on NTG.  Patient does not have prominent exertional dyspnea, chest pain is his main complaint.  Dialysis is TTS.   Review of systems complete and found to be negative unless listed above in HPI  Past Medical History: 1. ESRD: TTS HD. 2. CAD: CABG (1992) with LIMA-LAD, SVG-D, SVG-OM (SVG-D and SVG-OM known occluded).  Re-do CABG (2006) with SVG-OM1 and SVG-OM2.  10/12 NSTEMI with BMS to SVG-OM1.  Cardiolite 9/14 with basal inferior infarction but no ischemia.  Has had chronic stable angina.  3. Chronic LBBB 4. Type II diabetes 5. Gout  6. HTN 7. Hyperlipidemia 8. Hypothyroidism 9. Colon cancer: s/p colectomy in 2003.  10. CVA 1996.  11. PUD 12. BPH 13. Carotid stenosis: Carotid dopplers 0000000 with 0000000 LICA stenosis.  14. Lower extremity arterial dopplers 10/14 with calcified vessels but no significant stenosis.  15. Echo (10/14) with EF  50-55%, mild LVH, mild MR  Family History  Problem Relation Age of Onset  . Heart disease Mother   . Hypertension Mother   . Diabetes Mother   . Heart disease Father   . Hypertension Father   . Diabetes Father     History   Social History  . Marital Status: Married    Spouse Name: N/A    Number of Children: 3  . Years of Education: N/A   Occupational History  . Not on file.   Social History Main Topics  . Smoking status: Never Smoker   . Smokeless tobacco: Never Used  . Alcohol Use: No  . Drug Use: No  . Sexual Activity: Not on file   Other Topics Concern  . Not on file   Social History Narrative   Long-term patient of Dr. Rollene Fare.   Married father of 67, grandfather 24.   Never smoked.   Not exercising D2 hip and back pain     Prescriptions prior to admission  Medication Sig Dispense Refill  . allopurinol (ZYLOPRIM) 100 MG tablet Take 100 mg by mouth daily.      Marland Kitchen aspirin 81 MG chewable tablet Chew 81 mg by mouth at bedtime.      Marland Kitchen atorvastatin (LIPITOR) 40 MG tablet Take 40 mg by mouth at bedtime.      Marland Kitchen Besifloxacin HCl (BESIVANCE) 0.6 % SUSP Place 1 drop into both eyes See  admin instructions. Use eye drops 4 times daily for 2 days following injection by Dr. Zigmund Daniel (once a month)      . calcium acetate (PHOSLO) 667 MG capsule Take 667 mg by mouth See admin instructions. Take 1 capsule (667 mg) with meals and with snacks      . carvedilol (COREG) 25 MG tablet Take 25 mg by mouth 2 (two) times daily with a meal.      . clopidogrel (PLAVIX) 75 MG tablet Take 1 tablet (75 mg total) by mouth daily.  90 tablet  3  . fenofibrate 160 MG tablet Take 1 tablet (160 mg total) by mouth daily.  30 tablet  6  . insulin NPH Human (NOVOLIN N) 100 UNIT/ML injection Inject 20 Units into the skin 2 (two) times daily before a meal.      . isosorbide mononitrate (IMDUR) 30 MG 24 hr tablet Take 60 mg by mouth daily.      Marland Kitchen levothyroxine (SYNTHROID, LEVOTHROID) 125 MCG tablet Take 125  mcg by mouth daily before breakfast.      . lidocaine-prilocaine (EMLA) cream Apply 1 application topically as needed (prior to dialysis treatments).      . loperamide (IMODIUM) 2 MG capsule Take 2 mg by mouth 4 (four) times daily as needed for diarrhea or loose stools.       . multivitamin (RENA-VIT) TABS tablet Take 1 tablet by mouth daily.  90 tablet  3  . nitroGLYCERIN (NITROSTAT) 0.4 MG SL tablet Place 0.4 mg under the tongue every 5 (five) minutes as needed for chest pain.      Marland Kitchen temazepam (RESTORIL) 15 MG capsule Take 15 mg by mouth at bedtime as needed for sleep.      Marland Kitchen terazosin (HYTRIN) 2 MG capsule Take 2 mg by mouth at bedtime.      Marland Kitchen glucose blood (PRODIGY NO CODING BLOOD GLUC) test strip Test as directed  100 each  6   Current Medications Scheduled Meds: . allopurinol  100 mg Oral Daily  . aspirin  81 mg Oral QHS  . aspirin EC  81 mg Oral Daily  . atorvastatin  40 mg Oral QHS  . calcium acetate  667 mg Oral TID WC  . carvedilol  25 mg Oral BID WC  . clopidogrel  75 mg Oral Daily  . fenofibrate  160 mg Oral Daily  . insulin aspart  0-9 Units Subcutaneous 6 times per day  . insulin NPH Human  7 Units Subcutaneous BID AC & HS  . isosorbide mononitrate  60 mg Oral Daily  . levothyroxine  125 mcg Oral QAC breakfast  . nitroGLYCERIN  0.5 inch Topical 4 times per day  . terazosin  2 mg Oral QHS   Continuous Infusions: . heparin 1,000 Units/hr (02/23/13 0114)   PRN Meds:.calcium acetate, temazepam  Physical exam Blood pressure 171/65, pulse 64, temperature 98.8 F (37.1 C), temperature source Oral, resp. rate 16, height 5\' 5"  (1.651 m), weight 78.109 kg (172 lb 3.2 oz), SpO2 100.00%. General: NAD Neck: JVP 8-9 cm, no thyromegaly or thyroid nodule.  Lungs: Clear to auscultation bilaterally with normal respiratory effort. CV: Nondisplaced PMI.  Heart regular S1/S2, no S3/S4, no murmur (somewhat distant heart sounds).  No peripheral edema.  No carotid bruit. Difficult to  palpate pedal pulses.   Abdomen: Soft, nontender, no hepatosplenomegaly, no distention.  Skin: Intact without lesions or rashes.  Neurologic: Alert and oriented x 3.  Psych: Normal affect. Extremities: No clubbing or  cyanosis.  HEENT: Normal.   Labs:   Lab Results  Component Value Date   WBC 8.2 02/23/2013   HGB 10.8* 02/23/2013   HCT 33.2* 02/23/2013   MCV 93.5 02/23/2013   PLT 274 02/23/2013    Recent Labs Lab 02/23/13 0440  NA 142  K 3.9  CL 102  CO2 25  BUN 40*  CREATININE 4.10*  CALCIUM 9.3  GLUCOSE 114*   Lab Results  Component Value Date   CKTOTAL 163 10/12/2010   CKMB 9.4* 10/12/2010   TROPONINI 1.65* 02/23/2013    Lab Results  Component Value Date   CHOL 114 02/23/2013   CHOL 90 12/22/2012   CHOL 123 06/20/2012   Lab Results  Component Value Date   HDL 28* 02/23/2013   HDL 24.30* 12/22/2012   HDL 26.70* 06/20/2012   Lab Results  Component Value Date   LDLCALC 32 02/23/2013   LDLCALC 42 06/18/2011   LDLCALC 14 03/21/2011   Lab Results  Component Value Date   TRIG 268* 02/23/2013   TRIG 240.0* 12/22/2012   TRIG 245.0* 06/20/2012   Lab Results  Component Value Date   CHOLHDL 4.1 02/23/2013   CHOLHDL 4 12/22/2012   CHOLHDL 5 06/20/2012   Lab Results  Component Value Date   LDLDIRECT 33.5 12/22/2012   LDLDIRECT 55.4 06/20/2012   LDLDIRECT 77.9 12/17/2011      Radiology: - CXR: pulmonary vascular congestion  EKG: NSR, LBBB (chronic)  ASSESSMENT AND PLAN:  73 yo with history of CABG in 1992, redo CABG in 2006, and BMS to SVG-OM1 in 10/12 also with ESRD presented with NSTEMI. 1. NSTEMI: Elevated troponin, symptoms consistent with ACS.  Currently pain-free but had recurrent pain in early am. ECG with chronic LBBB.   - He will need cardiac cath, will arrange for today.  - Continue ASA 81, Plavix, statin, NTG paste, heparin gtt - Will get echocardiogram 2. ESRD: Will need HD set up (primary service to contact nephrology.  3. Chronic diastolic CHF: Mild  volume overload on exam.  Will need HD for fluid removal.  As above, will get echo.   Loralie Champagne 02/23/2013 9:41 AM

## 2013-02-23 NOTE — Interval H&P Note (Signed)
Cath Lab Visit (complete for each Cath Lab visit)  Clinical Evaluation Leading to the Procedure:   ACS: yes  Non-ACS:    Anginal Classification: CCS IV  Anti-ischemic medical therapy: Maximal Therapy (2 or more classes of medications)  Non-Invasive Test Results: No non-invasive testing performed  Prior CABG: Previous CABG      History and Physical Interval Note:  02/23/2013 5:11 PM  [name] [name]  has presented today for surgery, with the diagnosis of cp  The various methods of treatment have been discussed with the patient and family. After consideration of risks, benefits and other options for treatment, the patient has consented to  Procedure(s): LEFT HEART CATHETERIZATION WITH CORONARY ANGIOGRAM (N/A) as a surgical intervention .  The patient's history has been reviewed, patient examined, no change in status, stable for surgery.  I have reviewed the patient's chart and labs.  Questions were answered to the patient's satisfaction.     Lorretta Harp

## 2013-02-23 NOTE — Progress Notes (Signed)
Nutrition Brief Note  Patient identified on the Malnutrition Screening Tool (MST) Report for recent weight lost without trying and eating poorly because of a decreased appetite.  Per readings below, patient's weight has slightly trended down; patient attributes this to fluid loss.  Wt Readings from Last 15 Encounters:  02/23/13 172 lb 3.2 oz (78.109 kg)  02/23/13 172 lb 3.2 oz (78.109 kg)  01/21/13 172 lb 6 oz (78.189 kg)  12/22/12 175 lb (79.379 kg)  10/22/12 175 lb 9.6 oz (79.652 kg)  10/03/12 178 lb (80.74 kg)  09/29/12 178 lb 6.4 oz (80.922 kg)  09/22/12 179 lb (81.194 kg)  06/20/12 175 lb 12.8 oz (79.742 kg)  03/17/12 184 lb 3.2 oz (83.553 kg)  12/17/11 183 lb 6.4 oz (83.19 kg)  09/17/11 181 lb 9.6 oz (82.373 kg)  07/25/11 180 lb (81.647 kg)  06/18/11 183 lb 3.2 oz (83.099 kg)  05/01/11 176 lb 12.9 oz (80.2 kg)    Body mass index is 28.66 kg/(m^2). Patient meets criteria for Overweight based on current BMI.   Current diet order is NPO; awaiting for cardiac cath procedure.  Patient reports a good appetite. Labs and medications reviewed.   No nutrition interventions warranted at this time. If nutrition issues arise, please consult RD.   Arthur Holms, RD, LDN Pager #: [phone] After-Hours Pager #: [phone]

## 2013-02-23 NOTE — CV Procedure (Signed)
[name] [name] is a 73 y.o. male    [id number] LOCATION:  FACILITY: [facility]  PHYSICIAN: Quay Burow, M.D. [birth year]   DATE OF PROCEDURE:  02/23/2013  DATE OF DISCHARGE:     CARDIAC CATHETERIZATION     History obtained from chart review.73 yo with history of CABG in 1992, redo CABG in 2006, and BMS to SVG-OM1 in 10/12 also with ESRD presented with NSTEMI. Patient has had chronic stable angina. However, for the last several weeks, he has been having more chest pain and using more NTG. He has been having daily chest pain with just walking around in his house. NTG had been working to stop the pain. Dr. Ellyn Hack increased his Imdur to 60 but this did not seem to help. Yesterday, he took out the trash can and developed his typical substernal aching but more severe. He took NTG x 2, each time the NTG would temporarily help but the pain would return. He came to the ER. Pain eventually resolved on NTG patch. It lasted total for several hours. It returned early this morning but resolved with NTG again. Troponin was initially negative but increased to 1.65. He is on NTG. Patient does not have prominent exertional dyspnea, chest pain is his main complaint. Dialysis is TTS.     PROCEDURE DESCRIPTION:   The patient was brought to the second floor  Cardiac cath lab in the postabsorptive state. He was premedicated with Valium 5 mg by mouth, IV Versed and fentanyl. His right groin was prepped and shaved in usual sterile fashion. Xylocaine 1% was used for local anesthesia. A 5 French sheath was inserted into the right common femoral artery using standard Seldinger technique. 5 French right and left Judkins diagnostic catheters , left bypass graft catheter and pigtail catheters were used for selective coronary angiography, selective vein graft and IMA angiography and left heart cath obtaining left heart pressures. Visipaque dye was used for the entirety of the case. A retrograde aortic, left  ventricular pullback pressures were recorded. Total contrast administered during the case was 115 cc.  HEMODYNAMICS:    AO SYSTOLIC/AO DIASTOLIC: [id number]   LV SYSTOLIC/LV DIASTOLIC: [id number]  ANGIOGRAPHIC RESULTS:   1. Left main; 50-70% stenosed  2. LAD; occluded after the first small diagonal branch 3. Left circumflex; occluded at the origin.  4. Right coronary artery; comment with 40-50% segmental proximal, 75-80% focal mid and 70% distal at the proximal. The left finding was unchanged compared to the previous cath 5.LIMA TO LAD; widely patent 6. SVG TO obtuse marginal branch was widely patent including the proximal stent     SVG TO diagonal branch was widely patent 7. Left ventriculography;was not performed since an echocardiogram already demonstrated the ejection fraction.  IMPRESSION:Mr. Hasenstab has patent grafts unchanged from his prior cath to your 3 years ago. It does appear that he has progression in his mid dominant RCA. He has progressive neck treat response of symptoms. We'll proceed with FFR determination of his mid RCA lesion.  Procedure description: The existing 5 French sheath was exchanged over a wire for a 6 [place] sheath. The patient received Anzemet falls with an ACT of 326. He was already on dual antiplatelet therapy. Using a JR 4 guide catheter along with the FFR wire the lesion was crossed and then he was administered resulting in the FFR of 0.76 . Suggested physiologic significance. Following this predilatation was performed with a 20/12 emergency limb of the mid 75-80% dominant RCA lesion. Stenting was performed with  a 2.75 x 15 mm long Xience  alpine drug-eluting stent. Unfortunately the stent was unable to traverse the proximal segment of the RCA probably related to diffuse calcification. A ProWater wire was then placed across the lesion into the distal vessel and a buddy wire technique was performed again not allowing passage of the stent. I then used a guide liner along  with a 20 emerged balloon and traversed the proximal portion of the vessel. This allowed easy passage of the stent was carefully positioned and deployed at 18 atmospheres. It was post dilated with a 3.0 x 12 mm long [place] at 17 atmospheres (3.09 mm) resulting reduction of a 75%-80% lesion to 0% residual. The patient tolerated the procedure well without hemodynamic or left or electrocardiographic sequela.  Final impression: Successful PTCI stenting of physiologically significant mid dominant RCA stenosis with a drug-eluting stent. We will continue to S. Therapy with aspirin and Plavix. The patient received an additional 300 mg of by mouth Plavix during the case. The Angiomax was discontinued at the end of the case. The sheath will be removed in several hours the pressure will be held. The patient will need hemodialysis tomorrow after which time he can be discharged home.   Lorretta Harp MD, [facility] 02/23/2013 6:51 PM

## 2013-02-23 NOTE — H&P (View-Only) (Signed)
CARDIOLOGY CONSULT NOTE  Patient ID: [name] [name] MRN: [id number] DOB/AGE: 72-14-1942 72 y.o.  Admit date: 02/22/2013 Primary Physician: Dr Birdie Riddle Primary Cardiologist: Dr Ellyn Hack Reason for Consultation: NSTEMI  HPI:  73 yo with history of CABG in 1992, redo CABG in 2006, and BMS to SVG-OM1 in 10/12 also with ESRD presented with NSTEMI.  Patient has had chronic stable angina.  However, for the last several weeks, he has been having more chest pain and using more NTG.  He has been having daily chest pain with just walking around in his house.  NTG had been working to stop the pain.  Dr. Ellyn Hack increased his Imdur to 60 but this did not seem to help. Yesterday, he took out the trash can and developed his typical substernal aching but more severe.  He took NTG x 2, each time the NTG would temporarily help but the pain would return.  He came to the ER.  Pain eventually resolved on NTG patch.  It lasted total for several hours.  It returned early this morning but resolved with NTG again.  Troponin was initially negative but increased to 1.65.  He is on NTG.  Patient does not have prominent exertional dyspnea, chest pain is his main complaint.  Dialysis is TTS.   Review of systems complete and found to be negative unless listed above in HPI  Past Medical History: 1. ESRD: TTS HD. 2. CAD: CABG (1992) with LIMA-LAD, SVG-D, SVG-OM (SVG-D and SVG-OM known occluded).  Re-do CABG (2006) with SVG-OM1 and SVG-OM2.  10/12 NSTEMI with BMS to SVG-OM1.  Cardiolite 9/14 with basal inferior infarction but no ischemia.  Has had chronic stable angina.  3. Chronic LBBB 4. Type II diabetes 5. Gout  6. HTN 7. Hyperlipidemia 8. Hypothyroidism 9. Colon cancer: s/p colectomy in 2003.  10. CVA 1996.  11. PUD 12. BPH 13. Carotid stenosis: Carotid dopplers 0000000 with 0000000 LICA stenosis.  14. Lower extremity arterial dopplers 10/14 with calcified vessels but no significant stenosis.  15. Echo (10/14) with EF  50-55%, mild LVH, mild MR  Family History  Problem Relation Age of Onset  . Heart disease Mother   . Hypertension Mother   . Diabetes Mother   . Heart disease Father   . Hypertension Father   . Diabetes Father     History   Social History  . Marital Status: Married    Spouse Name: N/A    Number of Children: 3  . Years of Education: N/A   Occupational History  . Not on file.   Social History Main Topics  . Smoking status: Never Smoker   . Smokeless tobacco: Never Used  . Alcohol Use: No  . Drug Use: No  . Sexual Activity: Not on file   Other Topics Concern  . Not on file   Social History Narrative   Long-term patient of Dr. Rollene Fare.   Married father of 51, grandfather 32.   Never smoked.   Not exercising D2 hip and back pain     Prescriptions prior to admission  Medication Sig Dispense Refill  . allopurinol (ZYLOPRIM) 100 MG tablet Take 100 mg by mouth daily.      Marland Kitchen aspirin 81 MG chewable tablet Chew 81 mg by mouth at bedtime.      Marland Kitchen atorvastatin (LIPITOR) 40 MG tablet Take 40 mg by mouth at bedtime.      Marland Kitchen Besifloxacin HCl (BESIVANCE) 0.6 % SUSP Place 1 drop into both eyes See  admin instructions. Use eye drops 4 times daily for 2 days following injection by Dr. Zigmund Daniel (once a month)      . calcium acetate (PHOSLO) 667 MG capsule Take 667 mg by mouth See admin instructions. Take 1 capsule (667 mg) with meals and with snacks      . carvedilol (COREG) 25 MG tablet Take 25 mg by mouth 2 (two) times daily with a meal.      . clopidogrel (PLAVIX) 75 MG tablet Take 1 tablet (75 mg total) by mouth daily.  90 tablet  3  . fenofibrate 160 MG tablet Take 1 tablet (160 mg total) by mouth daily.  30 tablet  6  . insulin NPH Human (NOVOLIN N) 100 UNIT/ML injection Inject 20 Units into the skin 2 (two) times daily before a meal.      . isosorbide mononitrate (IMDUR) 30 MG 24 hr tablet Take 60 mg by mouth daily.      Marland Kitchen levothyroxine (SYNTHROID, LEVOTHROID) 125 MCG tablet Take 125  mcg by mouth daily before breakfast.      . lidocaine-prilocaine (EMLA) cream Apply 1 application topically as needed (prior to dialysis treatments).      . loperamide (IMODIUM) 2 MG capsule Take 2 mg by mouth 4 (four) times daily as needed for diarrhea or loose stools.       . multivitamin (RENA-VIT) TABS tablet Take 1 tablet by mouth daily.  90 tablet  3  . nitroGLYCERIN (NITROSTAT) 0.4 MG SL tablet Place 0.4 mg under the tongue every 5 (five) minutes as needed for chest pain.      Marland Kitchen temazepam (RESTORIL) 15 MG capsule Take 15 mg by mouth at bedtime as needed for sleep.      Marland Kitchen terazosin (HYTRIN) 2 MG capsule Take 2 mg by mouth at bedtime.      Marland Kitchen glucose blood (PRODIGY NO CODING BLOOD GLUC) test strip Test as directed  100 each  6   Current Medications Scheduled Meds: . allopurinol  100 mg Oral Daily  . aspirin  81 mg Oral QHS  . aspirin EC  81 mg Oral Daily  . atorvastatin  40 mg Oral QHS  . calcium acetate  667 mg Oral TID WC  . carvedilol  25 mg Oral BID WC  . clopidogrel  75 mg Oral Daily  . fenofibrate  160 mg Oral Daily  . insulin aspart  0-9 Units Subcutaneous 6 times per day  . insulin NPH Human  7 Units Subcutaneous BID AC & HS  . isosorbide mononitrate  60 mg Oral Daily  . levothyroxine  125 mcg Oral QAC breakfast  . nitroGLYCERIN  0.5 inch Topical 4 times per day  . terazosin  2 mg Oral QHS   Continuous Infusions: . heparin 1,000 Units/hr (02/23/13 0114)   PRN Meds:.calcium acetate, temazepam  Physical exam Blood pressure 171/65, pulse 64, temperature 98.8 F (37.1 C), temperature source Oral, resp. rate 16, height 5\' 5"  (1.651 m), weight 78.109 kg (172 lb 3.2 oz), SpO2 100.00%. General: NAD Neck: JVP 8-9 cm, no thyromegaly or thyroid nodule.  Lungs: Clear to auscultation bilaterally with normal respiratory effort. CV: Nondisplaced PMI.  Heart regular S1/S2, no S3/S4, no murmur (somewhat distant heart sounds).  No peripheral edema.  No carotid bruit. Difficult to  palpate pedal pulses.   Abdomen: Soft, nontender, no hepatosplenomegaly, no distention.  Skin: Intact without lesions or rashes.  Neurologic: Alert and oriented x 3.  Psych: Normal affect. Extremities: No clubbing or  cyanosis.  HEENT: Normal.   Labs:   Lab Results  Component Value Date   WBC 8.2 02/23/2013   HGB 10.8* 02/23/2013   HCT 33.2* 02/23/2013   MCV 93.5 02/23/2013   PLT 274 02/23/2013    Recent Labs Lab 02/23/13 0440  NA 142  K 3.9  CL 102  CO2 25  BUN 40*  CREATININE 4.10*  CALCIUM 9.3  GLUCOSE 114*   Lab Results  Component Value Date   CKTOTAL 163 10/12/2010   CKMB 9.4* 10/12/2010   TROPONINI 1.65* 02/23/2013    Lab Results  Component Value Date   CHOL 114 02/23/2013   CHOL 90 12/22/2012   CHOL 123 06/20/2012   Lab Results  Component Value Date   HDL 28* 02/23/2013   HDL 24.30* 12/22/2012   HDL 26.70* 06/20/2012   Lab Results  Component Value Date   LDLCALC 32 02/23/2013   LDLCALC 42 06/18/2011   LDLCALC 14 03/21/2011   Lab Results  Component Value Date   TRIG 268* 02/23/2013   TRIG 240.0* 12/22/2012   TRIG 245.0* 06/20/2012   Lab Results  Component Value Date   CHOLHDL 4.1 02/23/2013   CHOLHDL 4 12/22/2012   CHOLHDL 5 06/20/2012   Lab Results  Component Value Date   LDLDIRECT 33.5 12/22/2012   LDLDIRECT 55.4 06/20/2012   LDLDIRECT 77.9 12/17/2011      Radiology: - CXR: pulmonary vascular congestion  EKG: NSR, LBBB (chronic)  ASSESSMENT AND PLAN:  73 yo with history of CABG in 1992, redo CABG in 2006, and BMS to SVG-OM1 in 10/12 also with ESRD presented with NSTEMI. 1. NSTEMI: Elevated troponin, symptoms consistent with ACS.  Currently pain-free but had recurrent pain in early am. ECG with chronic LBBB.   - He will need cardiac cath, will arrange for today.  - Continue ASA 81, Plavix, statin, NTG paste, heparin gtt - Will get echocardiogram 2. ESRD: Will need HD set up (primary service to contact nephrology.  3. Chronic diastolic CHF: Mild  volume overload on exam.  Will need HD for fluid removal.  As above, will get echo.   Loralie Champagne 02/23/2013 9:41 AM

## 2013-02-23 NOTE — Progress Notes (Signed)
Pt awoke to urinate. After using urinal he had complaint of chest pressure with increasing intensity to 8/10. He became diaphoretic with mild shob. VSS. SL NTG given, oxygen at 2L/min applied. EKG done. Repeat VS after 23min with elevated BP. Pt rated pain at 2 and skin had dried. Second SL NTG given with complete resolution of pressure. VS remain stable with slightly elevated BP.

## 2013-02-23 NOTE — Progress Notes (Signed)
ANTICOAGULATION CONSULT NOTE - Initial Consult  Pharmacy Consult for heparin Indication: USAP  Allergies  Allergen Reactions  . Shellfish Allergy     Causes gout flare-ups    Patient Measurements: Weight: 170 lb (77.111 kg)  Vital Signs: Temp: 98.1 F (36.7 C) (02/15 2044) Temp src: Oral (02/15 2044) BP: 155/56 mmHg (02/15 2330) Pulse Rate: 69 (02/15 2330)  Labs:  Recent Labs  02/22/13 2050  HGB 10.7*  HCT 33.0*  PLT 271  CREATININE 4.01*    Medical History: Past Medical History  Diagnosis Date  . Diabetes mellitus   . Hyperlipidemia   . Hypertension   . Gout   . Hypothyroidism     On supplementation  . LBBB (left bundle branch block)     Chronic  . History of colon cancer 2003    colectomy for CA. no recurrence . never required  radiation or chem  . Anemia     Likely secondary to history of GI bleed  . Stroke 1998  . End stage renal disease on dialysis     Dialyzes at [facility]: Tues/Th/Sat - left upper cavity AV fistula brachiocephalic  . CAD in native artery; and the grafts 1992, 1997, 2002, 2006, 2012    CABG 1992 and redo CABG 2006  . CAD (coronary artery disease) of bypass graft 2006, 2012    In 2006: Occluded SVG-OM noted (SVG-diagonal was previously occluded); 2012: Severe lesion in redo SVG-OM1 --> BMS PCI   . CAD S/P percutaneous coronary angioplasty October 2012    Non-STEMI: PCI to SVG-OM1 Integrity BMS 3.5 mm x 15 mm (3.85 mm)  . History of nuclear stress test Sept 2014    EF 39% (down from) 45%, no ischemia; but now the inferobasal perfusion defect is determined to be likley prior infarction (as opposed to diaphragmatic attenuation)  . Echocardiogram findings abnormal, without diagnosis [id number]; Oct 2014    EF 50-55%, anteroseptal hypokinesis. Mild to moderate concentric LVH. Mild MR, aortic sclerosis. Mild atrial dilatation bilaterally.; Oct - mild LVH, EF ~50-55%, incoordinate septal WM, - with no other notable WMA  . History of  Non-ST elevated myocardial infarction (non-STEMI) 1992, 2006, 2012  . Peptic ulcer disease  2004  . BPH (benign prostatic hyperplasia)   . Chronic low back pain     Medications:  Prescriptions prior to admission  Medication Sig Dispense Refill  . allopurinol (ZYLOPRIM) 100 MG tablet Take 100 mg by mouth daily.      Marland Kitchen aspirin 81 MG chewable tablet Chew 81 mg by mouth at bedtime.      Marland Kitchen atorvastatin (LIPITOR) 40 MG tablet Take 40 mg by mouth at bedtime.      Marland Kitchen Besifloxacin HCl (BESIVANCE) 0.6 % SUSP Place 1 drop into both eyes See admin instructions. Use eye drops 4 times daily for 2 days following injection by Dr. Zigmund Daniel (once a month)      . calcium acetate (PHOSLO) 667 MG capsule Take 667 mg by mouth See admin instructions. Take 1 capsule (667 mg) with meals and with snacks      . carvedilol (COREG) 25 MG tablet Take 25 mg by mouth 2 (two) times daily with a meal.      . clopidogrel (PLAVIX) 75 MG tablet Take 1 tablet (75 mg total) by mouth daily.  90 tablet  3  . fenofibrate 160 MG tablet Take 1 tablet (160 mg total) by mouth daily.  30 tablet  6  . insulin NPH Human (NOVOLIN N)  100 UNIT/ML injection Inject 20 Units into the skin 2 (two) times daily before a meal.      . isosorbide mononitrate (IMDUR) 30 MG 24 hr tablet Take 60 mg by mouth daily.      Marland Kitchen levothyroxine (SYNTHROID, LEVOTHROID) 125 MCG tablet Take 125 mcg by mouth daily before breakfast.      . lidocaine-prilocaine (EMLA) cream Apply 1 application topically as needed (prior to dialysis treatments).      . loperamide (IMODIUM) 2 MG capsule Take 2 mg by mouth 4 (four) times daily as needed for diarrhea or loose stools.       . multivitamin (RENA-VIT) TABS tablet Take 1 tablet by mouth daily.  90 tablet  3  . nitroGLYCERIN (NITROSTAT) 0.4 MG SL tablet Place 0.4 mg under the tongue every 5 (five) minutes as needed for chest pain.      Marland Kitchen temazepam (RESTORIL) 15 MG capsule Take 15 mg by mouth at bedtime as needed for sleep.      Marland Kitchen  terazosin (HYTRIN) 2 MG capsule Take 2 mg by mouth at bedtime.      Marland Kitchen glucose blood (PRODIGY NO CODING BLOOD GLUC) test strip Test as directed  100 each  6   Scheduled:  . allopurinol  100 mg Oral Daily  . aspirin  81 mg Oral QHS  . aspirin EC  81 mg Oral Daily  . atorvastatin  40 mg Oral QHS  . calcium acetate  667 mg Oral TID WC  . carvedilol  25 mg Oral BID WC  . clopidogrel  75 mg Oral Daily  . fenofibrate  160 mg Oral Daily  . insulin aspart  0-9 Units Subcutaneous 6 times per day  . insulin NPH Human  7 Units Subcutaneous BID AC & HS  . isosorbide mononitrate  60 mg Oral Daily  . levothyroxine  125 mcg Oral QAC breakfast  . nitroGLYCERIN  0.5 inch Topical 4 times per day  . terazosin  2 mg Oral QHS    Assessment: 73yo male c/o CP similar to prior event, initial troponin negative, to begin heparin.  Goal of Therapy:  Heparin level 0.3-0.7 units/ml Monitor platelets by anticoagulation protocol: Yes   Plan:  Will give heparin 3000 units x1 followed by gtt at 1000 units/hr and monitor heparin levels and CBC.  Wynona Neat, PharmD, BCPS  02/23/2013,12:30 AM

## 2013-02-24 DIAGNOSIS — N2581 Secondary hyperparathyroidism of renal origin: Secondary | ICD-10-CM | POA: Diagnosis not present

## 2013-02-24 DIAGNOSIS — I2510 Atherosclerotic heart disease of native coronary artery without angina pectoris: Secondary | ICD-10-CM | POA: Diagnosis not present

## 2013-02-24 DIAGNOSIS — I509 Heart failure, unspecified: Secondary | ICD-10-CM

## 2013-02-24 DIAGNOSIS — I5033 Acute on chronic diastolic (congestive) heart failure: Secondary | ICD-10-CM | POA: Diagnosis not present

## 2013-02-24 DIAGNOSIS — I110 Hypertensive heart disease with heart failure: Secondary | ICD-10-CM

## 2013-02-24 DIAGNOSIS — I214 Non-ST elevation (NSTEMI) myocardial infarction: Secondary | ICD-10-CM | POA: Diagnosis not present

## 2013-02-24 DIAGNOSIS — I5030 Unspecified diastolic (congestive) heart failure: Secondary | ICD-10-CM

## 2013-02-24 DIAGNOSIS — D631 Anemia in chronic kidney disease: Secondary | ICD-10-CM | POA: Diagnosis not present

## 2013-02-24 DIAGNOSIS — R079 Chest pain, unspecified: Secondary | ICD-10-CM | POA: Diagnosis not present

## 2013-02-24 DIAGNOSIS — I120 Hypertensive chronic kidney disease with stage 5 chronic kidney disease or end stage renal disease: Secondary | ICD-10-CM | POA: Diagnosis not present

## 2013-02-24 DIAGNOSIS — E1149 Type 2 diabetes mellitus with other diabetic neurological complication: Secondary | ICD-10-CM | POA: Diagnosis not present

## 2013-02-24 DIAGNOSIS — N186 End stage renal disease: Secondary | ICD-10-CM | POA: Diagnosis not present

## 2013-02-24 LAB — BASIC METABOLIC PANEL
BUN: 44 mg/dL — ABNORMAL HIGH (ref 6–23)
CALCIUM: 8.5 mg/dL (ref 8.4–10.5)
CHLORIDE: 103 meq/L (ref 96–112)
CO2: 20 meq/L (ref 19–32)
CREATININE: 3.98 mg/dL — AB (ref 0.50–1.35)
GFR calc Af Amer: 16 mL/min — ABNORMAL LOW (ref >90)
GFR calc non Af Amer: 14 mL/min — ABNORMAL LOW (ref >90)
GLUCOSE: 196 mg/dL — AB (ref 70–99)
Potassium: 4.3 mEq/L (ref 3.7–5.3)
Sodium: 138 mEq/L (ref 137–147)

## 2013-02-24 LAB — RENAL FUNCTION PANEL
ALBUMIN: 3.5 g/dL (ref 3.5–5.2)
BUN: 46 mg/dL — ABNORMAL HIGH (ref 6–23)
CHLORIDE: 104 meq/L (ref 96–112)
CO2: 20 mEq/L (ref 19–32)
CREATININE: 4.22 mg/dL — AB (ref 0.50–1.35)
Calcium: 8.9 mg/dL (ref 8.4–10.5)
GFR calc Af Amer: 15 mL/min — ABNORMAL LOW (ref >90)
GFR, EST NON AFRICAN AMERICAN: 13 mL/min — AB (ref >90)
Glucose, Bld: 127 mg/dL — ABNORMAL HIGH (ref 70–99)
Phosphorus: 3.2 mg/dL (ref 2.3–4.6)
Potassium: 4.3 mEq/L (ref 3.7–5.3)
Sodium: 140 mEq/L (ref 137–147)

## 2013-02-24 LAB — CBC
HCT: 29.7 % — ABNORMAL LOW (ref 39.0–52.0)
HCT: 30.8 % — ABNORMAL LOW (ref 39.0–52.0)
Hemoglobin: 9.6 g/dL — ABNORMAL LOW (ref 13.0–17.0)
Hemoglobin: 10.1 g/dL — ABNORMAL LOW (ref 13.0–17.0)
MCH: 30.1 pg (ref 26.0–34.0)
MCH: 30.1 pg (ref 26.0–34.0)
MCHC: 32.3 g/dL (ref 30.0–36.0)
MCHC: 32.8 g/dL (ref 30.0–36.0)
MCV: 93.1 fL (ref 78.0–100.0)
MCV: 91.9 fL (ref 78.0–100.0)
PLATELETS: 251 10*3/uL (ref 150–400)
Platelets: 245 10*3/uL (ref 150–400)
RBC: 3.19 MIL/uL — AB (ref 4.22–5.81)
RBC: 3.35 MIL/uL — ABNORMAL LOW (ref 4.22–5.81)
RDW: 13.9 % (ref 11.5–15.5)
RDW: 13.9 % (ref 11.5–15.5)
WBC: 10.2 10*3/uL (ref 4.0–10.5)
WBC: 9.2 10*3/uL (ref 4.0–10.5)

## 2013-02-24 LAB — GLUCOSE, CAPILLARY
GLUCOSE-CAPILLARY: 101 mg/dL — AB (ref 70–99)
Glucose-Capillary: 104 mg/dL — ABNORMAL HIGH (ref 70–99)
Glucose-Capillary: 122 mg/dL — ABNORMAL HIGH (ref 70–99)

## 2013-02-24 LAB — POCT ACTIVATED CLOTTING TIME
ACTIVATED CLOTTING TIME: 121 s
Activated Clotting Time: 143 seconds
Activated Clotting Time: 326 seconds

## 2013-02-24 MED ORDER — ATROPINE SULFATE 0.1 MG/ML IJ SOLN
INTRAMUSCULAR | Status: AC
  Filled 2013-02-24: qty 10

## 2013-02-24 MED ORDER — DOXERCALCIFEROL 4 MCG/2ML IV SOLN
INTRAVENOUS | Status: AC
  Administered 2013-02-24: 1 ug via INTRAVENOUS
  Filled 2013-02-24: qty 2

## 2013-02-24 MED ORDER — DARBEPOETIN ALFA-POLYSORBATE 25 MCG/0.42ML IJ SOLN
INTRAMUSCULAR | Status: AC
  Filled 2013-02-24: qty 0.42

## 2013-02-24 NOTE — Progress Notes (Signed)
Utilization Review Completed.[name] [name] T2/17/2015  

## 2013-02-24 NOTE — Procedures (Signed)
Tolerating HD. S/p PTCI yesterday.  Looks comfortable and hemodynamically stable. Will increase goal. CXR on admit c/w CHF. Erling Cruz, MD

## 2013-02-24 NOTE — Progress Notes (Signed)
Pt education completed at bedside; pt acknowledged when to call MD and when to call 911. PIV removed; pt has no further comments, questions or concerns; emotional support given; pt wheeled out to daughter's car;

## 2013-02-24 NOTE — Progress Notes (Signed)
CARDIAC REHAB PHASE I   PRE:  Rate/Rhythm: 61 SR    BP: sitting 123/36    SaO2: 97 RA  MODE:  Ambulation: 390 ft   POST:  Rate/Rhythm: 71 SR    BP: sitting 148/43     SaO2: 96 RA  Tolerated well, no c/o. Vss. Ed completed/reviewed. Encouraged more exercise and finding an indoor place. Not interested in CRPII at this time. [id number]  Josephina Shih [place] CES, ACSM 02/24/2013 1:50 PM

## 2013-02-24 NOTE — Progress Notes (Signed)
Subjective: [name] is a 73 yo man pmh CABG in 1992, redo CABG in 2006, and BMS to SVG-OM1 in 10/12  p/w NSTEMI (trop peak 2.0) 02/23/13. No CP or SOB this AM. Overall feeling much better.   LHC 02/23/13: patent grafts and stent, mid-RCA occlusion s/p 2.75 x 10mm Xience DES.   Objective: Vital signs in last 24 hours: Filed Vitals:   02/24/13 0125 02/24/13 0127 02/24/13 0707 02/24/13 0710  BP: 104/35 110/32 151/50 154/75  Pulse: 48 50    Temp:   97 F (36.1 C)   TempSrc:   Oral   Resp:   17   Height:      Weight:   167 lb 8.8 oz (76 kg)   SpO2: 96% 96%     Weight change: -1 lb 5.6 oz (-0.612 kg)  Intake/Output Summary (Last 24 hours) at 02/24/13 0719 Last data filed at 02/24/13 0200  Gross per 24 hour  Intake 546.67 ml  Output    903 ml  Net -356.33 ml   General: resting in bed, NAD HEENT: PERRL, EOMI, no scleral icterus Cardiac: RRR, no rubs, murmurs or gallops Pulm: clear to auscultation bilaterally, moving normal volumes of air Abd: soft, nontender, nondistended, BS present Ext: warm and well perfused, no pedal edema, L femoral site w/o bruit and no active bleeding, LUE AVF with good thrill and bruit Neuro: alert and oriented X3, cranial nerves II-XII grossly intact  Lab Results: Basic Metabolic Panel:  Recent Labs Lab 02/23/13 0440 02/24/13 0235  NA 142 138  K 3.9 4.3  CL 102 103  CO2 25 20  GLUCOSE 114* 196*  BUN 40* 44*  CREATININE 4.10* 3.98*  CALCIUM 9.3 8.5   CBC:  Recent Labs Lab 02/23/13 0855 02/24/13 0235  WBC 8.2 10.2  HGB 10.8* 9.6*  HCT 33.2* 29.7*  MCV 93.5 93.1  PLT 274 245   Cardiac Enzymes:  Recent Labs Lab 02/23/13 0440 02/23/13 0635 02/23/13 1244  TROPONINI 1.29* 1.65* 2.00*   BNP:  Recent Labs Lab 02/22/13 2050  PROBNP 3970.0*   CBG:  Recent Labs Lab 02/23/13 0509 02/23/13 0804 02/23/13 1108 02/23/13 1612 02/23/13 2222 02/24/13 0037  GLUCAP 119* 111* 112* 98 101* 104*   Hemoglobin A1C:  Recent  Labs Lab 02/23/13 0440  HGBA1C 5.4   Fasting Lipid Panel:  Recent Labs Lab 02/23/13 0440  CHOL 114  HDL 28*  LDLCALC 32  TRIG 268*  CHOLHDL 4.1   Thyroid Function Tests:  Recent Labs Lab 02/23/13 0440  TSH 3.828   Coagulation:  Recent Labs Lab 02/23/13 0440  LABPROT 13.4  INR 1.04   Cardiac studies: LHC 02/23/13: 1. Left main; 50-70% stenosed  2. LAD; occluded after the first small diagonal branch  3. Left circumflex; occluded at the origin.  4. Right coronary artery; comment with 40-50% segmental proximal, 75-80% focal mid and 70% distal at the proximal. The left finding was unchanged compared to the previous cath  5.LIMA TO LAD; widely patent  6. SVG TO obtuse marginal branch was widely patent including the proximal stent  SVG TO diagonal branch was widely patent  Echo 02/23/13: EF [id number], grade 2 diastolic dysfunction, mild-mod MR, mild AR, LA moderate dilation   Echo 10/2012: EF 50-55%, nWMA, grade 1 diastolic dysfunction  Studies/Results: Dg Chest Port 1 View  02/22/2013   CLINICAL DATA:  Chest pain  EXAM: PORTABLE CHEST - 1 VIEW  COMPARISON:  DG CHEST 2 VIEW dated 05/02/2011  FINDINGS: Prior CABG.  Vascular congestion. Perihilar and lower lobe opacities may reflect mild edema. Linear atelectasis in the lingula. No effusions. No acute bony abnormality. Mild cardiomegaly.  IMPRESSION: Borderline cardiomegaly with vascular congestion and perihilar/lower lobe opacities. Concerning for mild edema/CHF.   Electronically Signed   By: Rolm Baptise M.D.   On: 02/22/2013 22:22   Medications: I have reviewed the patient's current medications. Scheduled Meds: . allopurinol  100 mg Oral Daily  . aspirin EC  81 mg Oral Daily  . atorvastatin  40 mg Oral QHS  . calcium acetate  667 mg Oral TID WC  . carvedilol  25 mg Oral BID WC  . clopidogrel  75 mg Oral Q breakfast  . darbepoetin  6.5476 mcg Intravenous Q Tue-HD  . doxercalciferol  1 mcg Intravenous Q T,Th,Sa-HD  .  fenofibrate  160 mg Oral Daily  . ferric gluconate (FERRLECIT/NULECIT) IV  125 mg Intravenous Q T,Th,Sa-HD  . insulin aspart  0-9 Units Subcutaneous 6 times per day  . insulin NPH Human  7 Units Subcutaneous BID AC & HS  . isosorbide mononitrate  60 mg Oral Daily  . levothyroxine  125 mcg Oral QAC breakfast  . terazosin  2 mg Oral QHS   Continuous Infusions: . sodium chloride 50 mL/hr at 02/23/13 1952   PRN Meds:.calcium acetate, morphine injection, temazepam Assessment/Plan: #NSTEMI s/p DES to mid RCA: trop peak (2.0) LHC 02/23/13 showed physiologically significant RCA stenosis that was DES stented and all other graft sites open.  -cont ASA, plavix -statin, carvedilol, imdur -from cardiology can go home on continued medical management and follow up  #ESRD: THS schedule.  -HD on 02/24/13  #HTN: well controlled.   #DM: management per primary team   LOS: 2 days   Clinton Gallant, MD 02/24/2013, 7:19 AM Patient seen and examined and history reviewed. Agree with above findings and plan. Doing well post PCI. No chest pain. Tolerated hemodialysis this am. Continue ASA and plavix. Patient already has a follow up appointment with Dr. Ellyn Hack. [place] for DC from out standpoint.  Collier [facility] 02/24/2013 11:51 AM

## 2013-02-24 NOTE — Progress Notes (Signed)
Subjective:   Feeling better, no chest pain, breathing ok. Ready to go home this afternoon  Objective Filed Vitals:   02/24/13 0710 02/24/13 0730 02/24/13 0756 02/24/13 0827  BP: 154/75 161/76 144/60 138/56  Pulse:  64 62 63  Temp:      TempSrc:      Resp:      Height:      Weight:      SpO2:       Physical Exam General: Well developed, well nourished, in no acute distress. On HD.  Head: Normocephalic, atraumatic, sclera non-icteric, mucus membranes are moist  Neck: Supple.   Lungs: Clear bilaterally to auscultation without wheezes, rales, or rhonchi, dim bases. Breathing is unlabored.  Heart: RRR with S1 S2. No murmurs, rubs, or gallops appreciated.  Abdomen: Soft, non-tender, non-distended with normoactive bowel sounds. No rebound/guarding. No obvious abdominal masses.  M-S: Strength and tone appear normal for age.  Lower extremities:without edema or ischemic changes, no open wounds. L groin cath site with dry dressing intact  Neuro: Alert and oriented X 3. Moves all extremities spontaneously.  Psych: Responds to questions appropriately with a normal affect.  Dialysis Access: LUA AVF    Dialysis Orders: TTS @ [place].  76 kgs 2K/2.25Ca Heparin 4000u QHD LUA AVF 350/800  Hectrol 19mcg IV/HD Epogen 1200 Units IV Q week Venofer 100mg  Q HD x10 (stop 2/26)  Profile 4   Assessment/Plan:  1. NSTEMI- Cardiac cath 2/16- successful PTCI stenting of mid dominant RCA stenosis- cont ASA and plavix,  nitro prn.- Cards consulted by primary. EKg- SR with 1st degree AV block, LBBB 2. 2D Echo 2/16- est EF 45-50% 3. ESRD - TTS @ [place]. HD today. K+4.3  UF goal 3500 May need lower edw at DC- thinks he has lost wt. Cont regular outpt schedule when DCd. 4. Hypertension/volume - 138/56 coreg and imdur. Mild edema, no effusion on chest xray 5. Anemia - hgb 10.1. Continue ESA and venofer (until 2/26, Tsat 17 on 1/29) 6. Metabolic bone disease - Cont phoslo with meals, and hectorol. phos 3.2/PTH 60  (1/29) 7. Nutrition - currently NPO, will need renal diet and mulitvitamin 8. DM- novolog and NPH, per primary.   Shelle Iron, NP [place] [phone] 02/24/2013,8:45 AM  LOS: 2 days    Additional Objective Labs: Basic Metabolic Panel:  Recent Labs Lab 02/23/13 0440 02/24/13 0235 02/24/13 0719  NA 142 138 140  K 3.9 4.3 4.3  CL 102 103 104  CO2 25 20 20   GLUCOSE 114* 196* 127*  BUN 40* 44* 46*  CREATININE 4.10* 3.98* 4.22*  CALCIUM 9.3 8.5 8.9  PHOS  --   --  3.2   Liver Function Tests:  Recent Labs Lab 02/24/13 0719  ALBUMIN 3.5   No results found for this basename: LIPASE, AMYLASE,  in the last 168 hours CBC:  Recent Labs Lab 02/22/13 2050 02/23/13 0440 02/23/13 0855 02/24/13 0235 02/24/13 0719  WBC 7.8 8.5 8.2 10.2 9.2  HGB 10.7* 10.8* 10.8* 9.6* 10.1*  HCT 33.0* 32.4* 33.2* 29.7* 30.8*  MCV 93.5 93.1 93.5 93.1 91.9  PLT 271 275 274 245 251   Blood Culture    Component Value Date/Time   SDES BLOOD HAND RIGHT 10/12/2010 1024   SPECREQUEST BOTTLES DRAWN AEROBIC ONLY 10CC  10/12/2010 1024   CULT NO GROWTH 5 DAYS 10/12/2010 1024   REPTSTATUS 10/18/2010 FINAL 10/12/2010 1024    Cardiac Enzymes:  Recent Labs Lab 02/23/13 0440 02/23/13 0635 02/23/13  1244  TROPONINI 1.29* 1.65* 2.00*   CBG:  Recent Labs Lab 02/23/13 0804 02/23/13 1108 02/23/13 1612 02/23/13 2222 02/24/13 0037  GLUCAP 111* 112* 98 101* 104*   Iron Studies: No results found for this basename: IRON, TIBC, TRANSFERRIN, FERRITIN,  in the last 72 hours @lablastinr3 @ Studies/Results: Dg Chest Port 1 View  02/22/2013   CLINICAL DATA:  Chest pain  EXAM: PORTABLE CHEST - 1 VIEW  COMPARISON:  DG CHEST 2 VIEW dated 05/02/2011  FINDINGS: Prior CABG. Vascular congestion. Perihilar and lower lobe opacities may reflect mild edema. Linear atelectasis in the lingula. No effusions. No acute bony abnormality. Mild cardiomegaly.  IMPRESSION: Borderline cardiomegaly with  vascular congestion and perihilar/lower lobe opacities. Concerning for mild edema/CHF.   Electronically Signed   By: Rolm Baptise M.D.   On: 02/22/2013 22:22   Medications: . sodium chloride 50 mL/hr at 02/23/13 1952   . allopurinol  100 mg Oral Daily  . aspirin EC  81 mg Oral Daily  . atorvastatin  40 mg Oral QHS  . calcium acetate  667 mg Oral TID WC  . carvedilol  25 mg Oral BID WC  . clopidogrel  75 mg Oral Q breakfast  . darbepoetin      . darbepoetin  6.5476 mcg Intravenous Q Tue-HD  . doxercalciferol  1 mcg Intravenous Q T,Th,Sa-HD  . fenofibrate  160 mg Oral Daily  . ferric gluconate (FERRLECIT/NULECIT) IV  125 mg Intravenous Q T,Th,Sa-HD  . insulin aspart  0-9 Units Subcutaneous 6 times per day  . insulin NPH Human  7 Units Subcutaneous BID AC & HS  . isosorbide mononitrate  60 mg Oral Daily  . levothyroxine  125 mcg Oral QAC breakfast  . terazosin  2 mg Oral QHS

## 2013-02-24 NOTE — Progress Notes (Signed)
After patient was educated and given instructions, right femoral artery sheath was pulled out in the presence of RN Romona. Pressure was held for 18 minutes to achieve hemostasis.. Pt tolerated procedure well. Will continue to monitor

## 2013-02-24 NOTE — Discharge Summary (Signed)
Physician Discharge Summary  [name] [name] [id number] DOB: [birth year] DOA: 02/22/2013  PCP: Annye Asa, MD  Admit date: 02/22/2013 Discharge date: 02/24/2013  Time spent: 45 minutes  Recommendations for Outpatient Follow-up:  1. Dr.Harding in 1-2weeks  Discharge Diagnoses:    NSTEMI   CAD s/p PCI   Diabetes mellitus with neuropathy   HYPERTENSION   CHEST PAIN   CAD (coronary artery disease) of bypass graft - PCI to SVG-OM with BMS   ESRD (end stage renal disease)   Unstable angina   Discharge Condition: stable  Diet recommendation: renal diabetic  Filed Weights   02/23/13 1910 02/24/13 0707 02/24/13 1107  Weight: 76.5 kg (168 lb 10.4 oz) 76 kg (167 lb 8.8 oz) 73.7 kg (162 lb 7.7 oz)    History of present illness:  [name] [name] is a 73 y.o. male with PMH of Diabetes mellitus; Hyperlipidemia; Hypertension; Gout; Hypothyroidism; LBBB, CAD/CABG, ESRD on TTS presented with 2-3 week hx of chest pain usually worse with exertion he was supposed to follow up Next week, Imdur was increased from 30 mg to 60 mg. The pain did not seem to improve. Pain felt like steady pressure over upper chest radiating to the neck. This pain i different from his MI pain. 2 years ago he needed to have stent placement at that time his pain was simmilar. Usually nitroglycerine would help but not today. This episode lasted about 7 hours and was finely relieved when he had nitro patch placed. Denies any nausea or vomiting , trop negative, he has hx of LBBB. Currently chest pain free.  Last HD ws on Saturday he gets dialysed on Tuesday, Thursday and Saturday.  Patient states he is able to make urine once a day   Hospital Course:  1. NSTEMI -bumped his troponin, was seen by Cardiology in consultation -continued on ASA/plavix/coreg  -treated with  IV heparin initially -underwent LHC on 02/23/13: patent grafts and stent, mid-RCA occlusion s/p 2.75 x 60mm Xience DES, he tolerated this well and per  cardiology was felt to be stable for discharge the next day.  -continue plavix/ASA/coreg/statin  2. CAD, s/p CABG, PCI  -see above   3. ESRD on HD, TTS  dialyzed today in hospital  4. DM  -continue Insulin   5. HTN -continue coreg/imdur   Procedures: LHC 02/23/13: patent grafts and stent, mid-RCA occlusion s/p 2.75 x 21mm Xience DES.      Discharge Exam: Filed Vitals:   02/24/13 1248  BP: 139/45  Pulse: 59  Temp:   Resp:     General: AAOx3 Cardiovascular: S1S2/RRR Respiratory: CTAB  Discharge Instructions  Discharge Orders   Future Appointments Provider Department Dept Phone   03/16/2013 9:30 AM Leonie Man, MD [facility] [phone]   03/20/2013 8:45 AM Hayden Pedro, MD [place] [phone]   03/23/2013 9:30 AM Midge Minium, MD [place] at  [place]   06/24/2013 8:30 AM Midge Minium, MD [place] at  [place]   Future Orders Complete By Expires   Diet - low sodium heart healthy  As directed    Diet Carb Modified  As directed    Increase activity slowly  As directed        Medication List         allopurinol 100 MG tablet  Commonly known as:  ZYLOPRIM  Take 100 mg by mouth daily.     aspirin 81 MG chewable tablet  Chew  81 mg by mouth at bedtime.     atorvastatin 40 MG tablet  Commonly known as:  LIPITOR  Take 40 mg by mouth at bedtime.     BESIVANCE 0.6 % Susp  Generic drug:  Besifloxacin HCl  Place 1 drop into both eyes See admin instructions. Use eye drops 4 times daily for 2 days following injection by Dr. Zigmund Daniel (once a month)     calcium acetate 667 MG capsule  Commonly known as:  PHOSLO  Take 667 mg by mouth See admin instructions. Take 1 capsule (667 mg) with meals and with snacks     carvedilol 25 MG tablet  Commonly known as:  COREG  Take 25 mg by mouth 2 (two) times daily with a meal.     clopidogrel 75 MG  tablet  Commonly known as:  PLAVIX  Take 1 tablet (75 mg total) by mouth daily.     fenofibrate 160 MG tablet  Take 1 tablet (160 mg total) by mouth daily.     glucose blood test strip  Commonly known as:  PRODIGY NO CODING BLOOD GLUC  Test as directed     isosorbide mononitrate 30 MG 24 hr tablet  Commonly known as:  IMDUR  Take 60 mg by mouth daily.     levothyroxine 125 MCG tablet  Commonly known as:  SYNTHROID, LEVOTHROID  Take 125 mcg by mouth daily before breakfast.     lidocaine-prilocaine cream  Commonly known as:  EMLA  Apply 1 application topically as needed (prior to dialysis treatments).     loperamide 2 MG capsule  Commonly known as:  IMODIUM  Take 2 mg by mouth 4 (four) times daily as needed for diarrhea or loose stools.     multivitamin Tabs tablet  Take 1 tablet by mouth daily.     nitroGLYCERIN 0.4 MG SL tablet  Commonly known as:  NITROSTAT  Place 0.4 mg under the tongue every 5 (five) minutes as needed for chest pain.     NOVOLIN N 100 UNIT/ML injection  Generic drug:  insulin NPH Human  Inject 20 Units into the skin 2 (two) times daily before a meal.     temazepam 15 MG capsule  Commonly known as:  RESTORIL  Take 15 mg by mouth at bedtime as needed for sleep.     terazosin 2 MG capsule  Commonly known as:  HYTRIN  Take 2 mg by mouth at bedtime.       Allergies  Allergen Reactions  . Shellfish Allergy     Causes gout flare-ups       Follow-up Information   Follow up with HARDING,DAVID W, MD In 2 weeks.   Specialty:  Cardiology   Contact information:   [address] [place] [address] [phone]        The results of significant diagnostics from this hospitalization (including imaging, microbiology, ancillary and laboratory) are listed below for reference.    Significant Diagnostic Studies: Dg Chest Port 1 View  02/22/2013   CLINICAL DATA:  Chest pain  EXAM: PORTABLE CHEST - 1 VIEW  COMPARISON:  DG CHEST 2  VIEW dated 05/02/2011  FINDINGS: Prior CABG. Vascular congestion. Perihilar and lower lobe opacities may reflect mild edema. Linear atelectasis in the lingula. No effusions. No acute bony abnormality. Mild cardiomegaly.  IMPRESSION: Borderline cardiomegaly with vascular congestion and perihilar/lower lobe opacities. Concerning for mild edema/CHF.   Electronically Signed   By: Rolm Baptise M.D.   On:  02/22/2013 22:22    Microbiology: No results found for this or any previous visit (from the past 240 hour(s)).   Labs: Basic Metabolic Panel:  Recent Labs Lab 02/22/13 2050 02/23/13 0440 02/24/13 0235 02/24/13 0719  NA 143 142 138 140  K 4.1 3.9 4.3 4.3  CL 99 102 103 104  CO2 29 25 20 20   GLUCOSE 143* 114* 196* 127*  BUN 35* 40* 44* 46*  CREATININE 4.01* 4.10* 3.98* 4.22*  CALCIUM 9.5 9.3 8.5 8.9  PHOS  --   --   --  3.2   Liver Function Tests:  Recent Labs Lab 02/24/13 0719  ALBUMIN 3.5   No results found for this basename: LIPASE, AMYLASE,  in the last 168 hours No results found for this basename: AMMONIA,  in the last 168 hours CBC:  Recent Labs Lab 02/22/13 2050 02/23/13 0440 02/23/13 0855 02/24/13 0235 02/24/13 0719  WBC 7.8 8.5 8.2 10.2 9.2  HGB 10.7* 10.8* 10.8* 9.6* 10.1*  HCT 33.0* 32.4* 33.2* 29.7* 30.8*  MCV 93.5 93.1 93.5 93.1 91.9  PLT 271 275 274 245 251   Cardiac Enzymes:  Recent Labs Lab 02/23/13 0440 02/23/13 0635 02/23/13 1244  TROPONINI 1.29* 1.65* 2.00*   BNP: BNP (last 3 results)  Recent Labs  02/22/13 2050  PROBNP 3970.0*   CBG:  Recent Labs Lab 02/23/13 1108 02/23/13 1612 02/23/13 2222 02/24/13 0037 02/24/13 1144  GLUCAP 112* 98 101* 104* 122*       Signed:  Adyan Palau  [facility] 02/24/2013, 3:28 PM

## 2013-03-07 DIAGNOSIS — N186 End stage renal disease: Secondary | ICD-10-CM | POA: Diagnosis not present

## 2013-03-10 DIAGNOSIS — N2581 Secondary hyperparathyroidism of renal origin: Secondary | ICD-10-CM | POA: Diagnosis not present

## 2013-03-10 DIAGNOSIS — E209 Hypoparathyroidism, unspecified: Secondary | ICD-10-CM | POA: Diagnosis not present

## 2013-03-10 DIAGNOSIS — N186 End stage renal disease: Secondary | ICD-10-CM | POA: Diagnosis not present

## 2013-03-10 DIAGNOSIS — D631 Anemia in chronic kidney disease: Secondary | ICD-10-CM | POA: Diagnosis not present

## 2013-03-10 DIAGNOSIS — N039 Chronic nephritic syndrome with unspecified morphologic changes: Secondary | ICD-10-CM | POA: Diagnosis not present

## 2013-03-11 DIAGNOSIS — E119 Type 2 diabetes mellitus without complications: Secondary | ICD-10-CM | POA: Diagnosis not present

## 2013-03-11 DIAGNOSIS — G609 Hereditary and idiopathic neuropathy, unspecified: Secondary | ICD-10-CM | POA: Diagnosis not present

## 2013-03-11 DIAGNOSIS — Q6689 Other  specified congenital deformities of feet: Secondary | ICD-10-CM | POA: Diagnosis not present

## 2013-03-16 ENCOUNTER — Ambulatory Visit (INDEPENDENT_AMBULATORY_CARE_PROVIDER_SITE_OTHER): Payer: Medicare Other | Admitting: Cardiology

## 2013-03-16 ENCOUNTER — Encounter: Payer: Self-pay | Admitting: Cardiology

## 2013-03-16 VITALS — BP 144/50 | HR 61 | Ht 65.0 in | Wt 170.1 lb

## 2013-03-16 DIAGNOSIS — I214 Non-ST elevation (NSTEMI) myocardial infarction: Secondary | ICD-10-CM | POA: Diagnosis not present

## 2013-03-16 DIAGNOSIS — I2510 Atherosclerotic heart disease of native coronary artery without angina pectoris: Secondary | ICD-10-CM | POA: Diagnosis not present

## 2013-03-16 DIAGNOSIS — I509 Heart failure, unspecified: Secondary | ICD-10-CM

## 2013-03-16 DIAGNOSIS — I110 Hypertensive heart disease with heart failure: Secondary | ICD-10-CM | POA: Diagnosis not present

## 2013-03-16 DIAGNOSIS — Z9861 Coronary angioplasty status: Secondary | ICD-10-CM

## 2013-03-16 DIAGNOSIS — E785 Hyperlipidemia, unspecified: Secondary | ICD-10-CM

## 2013-03-16 DIAGNOSIS — N186 End stage renal disease: Secondary | ICD-10-CM

## 2013-03-16 DIAGNOSIS — I10 Essential (primary) hypertension: Secondary | ICD-10-CM | POA: Diagnosis not present

## 2013-03-16 DIAGNOSIS — I25810 Atherosclerosis of coronary artery bypass graft(s) without angina pectoris: Secondary | ICD-10-CM | POA: Diagnosis not present

## 2013-03-16 DIAGNOSIS — I5030 Unspecified diastolic (congestive) heart failure: Secondary | ICD-10-CM

## 2013-03-16 DIAGNOSIS — I209 Angina pectoris, unspecified: Secondary | ICD-10-CM

## 2013-03-16 DIAGNOSIS — E039 Hypothyroidism, unspecified: Secondary | ICD-10-CM

## 2013-03-16 MED ORDER — ISOSORBIDE MONONITRATE ER 60 MG PO TB24: 60.0000 mg | ORAL_TABLET | Freq: Two times a day (BID) | ORAL | Status: DC

## 2013-03-16 NOTE — Patient Instructions (Signed)
INCREASE IMDUR TO 88 MG TWICE A DAY   Your physician wants you to follow-up in 3 MONTH  Dr Ellyn Hack.  You will receive a reminder letter in the mail two months in advance. If you don't receive a letter, please call our office to schedule the follow-up appointment.

## 2013-03-18 ENCOUNTER — Other Ambulatory Visit: Payer: Self-pay | Admitting: Family Medicine

## 2013-03-18 NOTE — Telephone Encounter (Signed)
Med filled.  

## 2013-03-19 ENCOUNTER — Encounter: Payer: Self-pay | Admitting: Cardiology

## 2013-03-19 NOTE — Assessment & Plan Note (Signed)
Back on dual antiplatelet therapy again. I think we'll, done this for lifelong. He is on statin and fenofibrate along with beta blocker long-acting nitroglycerin. Thankfully the vein graft stent that was patent on the most recent catheterization.

## 2013-03-19 NOTE — Assessment & Plan Note (Signed)
Unfortunately, he may have some spells where his blood pressure would look is the weekend titrate up medications, none he is around his dialysis time she simply cannot tolerate blood pressure medications. As is the usual case this makes it very difficult due to his hypertension which at in turn would treat his diastolic heart.

## 2013-03-19 NOTE — Progress Notes (Signed)
PCP: Annye Asa, MD  Clinic Note: Chief Complaint  Patient presents with  . 6 months    heart cath 2/15  & DES to RCA (Dr. Gwenlyn Found) - reports CP - took 1 NTG with relief, reports lightheadedness/dizziness (staggers)   HPI: [name] [name] is a 73 y.o. male with a Cardiovascular Problem List below who presents today for second posthospital followup. He has a long-standing cardiac as reviewed in his history of present illness. He now presents about a month out from a non-STEMI down to his a weekend where he was now found to have disease in the RCA that had previously been doing fine. He had PCI with a drug-eluting stent placement to the mid RCA. He was also noted to have a mild to moderately reduced EF. He has chronic diastolic heart failure and chronic stable angina made worse on pre-dialysis days.  His cardiac history dates back to 35: Former patient of Dr. Terance Ice 1992: CABG x3 (LIMA-LAD, SVG-diagonal, SVG-OM). Of these only the LIMA-LAD is patent  1997: Class IV angina, PTCA of the circumflex  2002 for class IV angina: Again PTCA to circumflex..  2006 non-STEMI: occluded vein graft to the OM1 --> redo CABG with SVG-OM1 and SVG-OM 2, with a patent LIMA from the initial surgery.  He developed End-Stage Renal Disease in May of 2011 -- now on hemodialysis. Non-STEMI in October 2012: High-grade lesion in the SVG-OM1--> Integrity BMS 3.5 mm x 15 mm postdilated up to 3.8 mm.  Followup Nuclear Stress Test a year later did not show any evidence of ischemia or infarction just diaphragmatic attenuation.  Known Left Bundle Branch Block, but has not had any heart related symptoms in the past. ** NEW: Non-STEMI 02/24/2011: Patent vein grafts to OM1 and diagonal along with patent LIMA; mid RCA lesion treated with Xience outline DES.   Interval History: Initially after his PCI a further non-STEMI he did quite well, but he still has intermittent chest discomfort just not as bad. It is  mostly at night on the days before dialysis. They have really adjusted his dry weight which makes him just totally wiped out on the post also states to however he does not angina is much those nights. He has been taking more nitroglycerin of late, but noted when we did increase his Imdur last time his symptoms improved significantly. He really is not that active at all, barely doing much around the house. Certainly doing much doing any exercise. Excellent he doesn't really comment much on exertional symptoms, but when pressed he does note exertional chest pain with moderate level of exertion that is associated with dyspnea as well. He denies any rapid or irregular heartbeats. No TIA or RCA symptoms. No significant near-syncope. No melena, hematochezia hematuria. He was assessed with fluoroscopy Dopplers which did not show any significant blockages for pain in the gluteus regions. This was therefore probably not gluteal claudication. With the exception of his predialysis nights especially on the long weekend times he does not have PND and orthopnea but on the predialysis nights he certainly does and is associated with angina. Also has pretty frequent cramps.  1. Angina, class II - stable   2. History of Non-ST elevated myocardial infarction (non-STEMI)   3. CAD (coronary artery disease) of bypass graft - PCI to SVG-OM with BMS; PCI-mid RCA with a Xience DES   4. Chronic Diastolic heart failure secondary to hypertension - NYHA Class II   5. HYPERTENSION   6. HYPERLIPIDEMIA  7. End stage renal disease - on hemodialysis   8. HYPOTHYROIDISM    Complete PMH reviewed & updated on Epic.  Cardiovascular Procedures/Studies   Procedure Laterality Date  . Av fistula repair Left     5/11  . Doppler echocardiography  01/25/2012    EF 50 to 55%  . Nm myocar perf wall motion  10/12/2011    EF 54%,LV normal ; no signifiant ischemia  . Cardiac catheterization  10/16/2010    patent  LIMA to the LAD , PATENT   svg TO 2nd marginals ,occluded PLA with collaterals and SVG to the OM  had 90% stenosis  was txlge  bare - metal  3.5  Integrity ten  postdilate  36-37  mm    . Cardiac catheterization  03/22/2004    loss of both SVG,severe disease in prox and ostial  circ not amenable to invention. mod diease distal LAD  AFTER BYPASS GRAFT;-CVTS to evaluate   . Coronary angioplasty  04/03/1995    OM and Circ  . Coronary angioplasty  02/01/2000    CIRC  . Carotid doppler  05/23/2012    ABN CAROTID-- RGT BULB/PROX ICA mild to mod 50-60%;lft bulb/prox mild to mod 0-49%;left subclavian abn waveforms consistent with patients lft arm A/V fistula  . Event monitor  01/23/2012-02/06/2012    SINUS ,LBBB,unifocal PVCs  . Coronary artery bypass graft  08/22/1990    INITIAL:LIMA to LAD, SVG to  Diagonal, SVG to OM  . Coronary artery bypass graft  2006    SVG to OM1,SVG to OM2 with patent LIMA  to LAD and occluded vein  graft to the diagonal  and occluded vein grft to OM from  prior  surgery  . Percutaneous coronary stent intervention (pci-s)  02/23/2013    mid RCA 80% & 70% - Xience 2.75 mm x 15 mm ( 3.18mm) ; LIMA-LAD patent, SVG-OM patent (stent patent); SVG-Diag patent.  . Transthoracic echocardiogram  02/23/2013    Moderate concentric hypertrophy. EF 4500%. Septal bounce. Grade 2 diastolic dysfunction (pseudo-normal - severely elevated filling pressures) mild to moderate MR and moderate LA dilation to  . Lower extremity arterial dopplers  October 2014    Calcified but non-occlusive peripheral arteries. No evidence of stenosis   MEDICATIONS AND ALLERGIES REVIEWED IN EPIC No Change in Social and Family History  ROS: A comprehensive Review of Systems - Negative except except for symptoms noted above. He does note general fatigue and malaise. No recent illnesses just that he has no energy. He seems relatively poorly motivated  PHYSICAL EXAM BP 144/50  Pulse 61  Ht 5\' 5"  (1.651 m)  Wt 170 lb 1.6 oz (77.157 kg)  BMI  28.31 kg/m2 General appearance: alert, cooperative, appears stated age, no distress; and Borderline obese. Chronically ill-appearing, pale, well groomed.Marland Kitchen answers questions appropriately.  Neck: no adenopathy, no carotid bruit, no JVD and supple, symmetrical, trachea midline  Lungs: clear to auscultation bilaterally, normal percussion bilaterally and Nonlabored, good air movement  Heart: normal apical impulse, RRR, S1, S2 normal, S4 present, SEM 2/6, crescendo, decrescendo and harsh at 2nd right intercostal space, no click and no rub  Abdomen: soft, non-tender; bowel sounds normal; no masses, no organomegaly and Mild truncal obesity  Extremities: extremities normal, atraumatic, no cyanosis or edema and fistula in the left upper arm with good thrill  Pulses: 2+ and symmetric  Except for right radial  Neurologic: Grossly normal  [id number] today: Yes Rate: 61 , Rhythm: NSR, LBBB; no change  Recent  Labs reviewed from hospitalization:   TC 114, TG 268, HDL 28, LDL 32  ASSESSMENT / PLAN: Angina, class II - stable Chronic stable angina that is persistent. Not nearly as significant as the MI symptoms he has had in the past. He did relatively well with the increased dose of Imdur last time, for some reason he was discharged on a lower dose.  He himself has increased it back to 60 mg. 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 He has very high filling pressures on his echo which would suggest significant diastolic dysfunction. This can easily be a cause of his supply versus demand mismatch related angina.Unfortunately he has been unable to figure out what to afford Ranexa in the past. Plan:  Increase Imdur to 60 mg twice a day. He will is gradually by taking 30 mg in the morning and 60 in the evening until his current 30 mg pills are gone.  Continue To treat other risk factors including dyslipidemia and hypertension.  Continue dual and  platelet therapy as well as max dose beta blocker.   Unfortunately his peri-dialysis hypotension precludes the use of calcium channel blockers, however we could consider shorter acting agents  History of Non-ST elevated myocardial infarction (non-STEMI) This most recent non-STEMI was an unexpected finding the seed disease in the RCA. Thankfully his vein grafts she was doing well.  Despite all these non-STEMI's his EF still remains relatively preserved.  CAD (coronary artery disease) of bypass graft - PCI to SVG-OM with BMS; PCI-mid RCA with a Xience DES Back on dual antiplatelet therapy again. I think we'll, done this for lifelong. He is on statin and fenofibrate along with beta blocker long-acting nitroglycerin. Thankfully the vein graft stent that was patent on the most recent catheterization.  HYPERTENSION Unfortunately, he may have some spells where his blood pressure would look is the weekend titrate up medications, none he is around his dialysis time she simply cannot tolerate blood pressure medications. As is the usual case this makes it very difficult due to his hypertension which at in turn would treat his diastolic heart.  HYPERLIPIDEMIA Is well-controlled as far as the LDL goes. His HDL is improving. His triglycerides are still elevated despite being on fenofibrate. Unfortunately, he is probably not be able to do any exercise nor to burn off calories. We talked about some dietary modifications which his wife swears that they are eating appropriately. I wonder if maybe he would benefit from a nutrition consult  Chronic Diastolic heart failure secondary to hypertension - NYHA Class II As mentioned before his diastolic dysfunction is clearly one of the reasons for his angina. Hopefully by increasing the Imdur dose we can help some. I am not sure that he can tolerate any more volume removal. He may be that the patient would require more frequent dialysis days with less aggressive volume  removal and each time.   Unfortunately as I noted he just does not tolerate up titration of blood pressure medications for afterload reduction.    Orders Placed This Encounter  Procedures  . EKG 12-Lead   Meds ordered this encounter  Medications  . isosorbide mononitrate (IMDUR) 60 MG 24 hr tablet    Sig: Take 1 tablet (60 mg total) by mouth 2 (two) times daily.    Dispense:  60 tablet    Refill:  3    Followup: 3 months  Olive Zmuda W. Ellyn Hack, M.D., M.S. Interventional Cardiologist [facility]

## 2013-03-19 NOTE — Assessment & Plan Note (Signed)
This most recent non-STEMI was an unexpected finding the seed disease in the RCA. Thankfully his vein grafts she was doing well.  Despite all these non-STEMI's his EF still remains relatively preserved.

## 2013-03-19 NOTE — Assessment & Plan Note (Signed)
Is well-controlled as far as the LDL goes. His HDL is improving. His triglycerides are still elevated despite being on fenofibrate. Unfortunately, he is probably not be able to do any exercise nor to burn off calories. We talked about some dietary modifications which his wife swears that they are eating appropriately. I wonder if maybe he would benefit from a nutrition consult

## 2013-03-19 NOTE — Assessment & Plan Note (Signed)
Chronic stable angina that is persistent. Not nearly as significant as the MI symptoms he has had in the past. He did relatively well with the increased dose of Imdur last time, for some reason he was discharged on a lower dose.  He himself has increased it back to 60 mg. 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 He has very high filling pressures on his echo which would suggest significant diastolic dysfunction. This can easily be a cause of his supply versus demand mismatch related angina.Unfortunately he has been unable to figure out what to afford Ranexa in the past. Plan:  Increase Imdur to 60 mg twice a day. He will is gradually by taking 30 mg in the morning and 60 in the evening until his current 30 mg pills are gone.  Continue To treat other risk factors including dyslipidemia and hypertension.  Continue dual and platelet therapy as well as max dose beta blocker.   Unfortunately his peri-dialysis hypotension precludes the use of calcium channel blockers, however we could consider shorter acting agents

## 2013-03-19 NOTE — Assessment & Plan Note (Signed)
As mentioned before his diastolic dysfunction is clearly one of the reasons for his angina. Hopefully by increasing the Imdur dose we can help some. I am not sure that he can tolerate any more volume removal. He may be that the patient would require more frequent dialysis days with less aggressive volume removal and each time.   Unfortunately as I noted he just does not tolerate up titration of blood pressure medications for afterload reduction.

## 2013-03-20 ENCOUNTER — Encounter (INDEPENDENT_AMBULATORY_CARE_PROVIDER_SITE_OTHER): Payer: Medicare Other | Admitting: Ophthalmology

## 2013-03-20 DIAGNOSIS — I10 Essential (primary) hypertension: Secondary | ICD-10-CM

## 2013-03-20 DIAGNOSIS — E1139 Type 2 diabetes mellitus with other diabetic ophthalmic complication: Secondary | ICD-10-CM

## 2013-03-20 DIAGNOSIS — E11311 Type 2 diabetes mellitus with unspecified diabetic retinopathy with macular edema: Secondary | ICD-10-CM | POA: Diagnosis not present

## 2013-03-20 DIAGNOSIS — E11319 Type 2 diabetes mellitus with unspecified diabetic retinopathy without macular edema: Secondary | ICD-10-CM | POA: Diagnosis not present

## 2013-03-20 DIAGNOSIS — E1165 Type 2 diabetes mellitus with hyperglycemia: Secondary | ICD-10-CM

## 2013-03-20 DIAGNOSIS — H43819 Vitreous degeneration, unspecified eye: Secondary | ICD-10-CM

## 2013-03-20 DIAGNOSIS — H35039 Hypertensive retinopathy, unspecified eye: Secondary | ICD-10-CM

## 2013-03-23 ENCOUNTER — Ambulatory Visit (INDEPENDENT_AMBULATORY_CARE_PROVIDER_SITE_OTHER): Payer: Medicare Other | Admitting: Family Medicine

## 2013-03-23 ENCOUNTER — Encounter: Payer: Self-pay | Admitting: Family Medicine

## 2013-03-23 ENCOUNTER — Telehealth: Payer: Self-pay | Admitting: General Practice

## 2013-03-23 VITALS — BP 126/62 | HR 64 | Temp 98.0°F | Resp 16 | Wt 168.0 lb

## 2013-03-23 DIAGNOSIS — N186 End stage renal disease: Secondary | ICD-10-CM | POA: Diagnosis not present

## 2013-03-23 DIAGNOSIS — I2510 Atherosclerotic heart disease of native coronary artery without angina pectoris: Secondary | ICD-10-CM

## 2013-03-23 DIAGNOSIS — Z9861 Coronary angioplasty status: Secondary | ICD-10-CM

## 2013-03-23 DIAGNOSIS — E1149 Type 2 diabetes mellitus with other diabetic neurological complication: Secondary | ICD-10-CM

## 2013-03-23 DIAGNOSIS — I209 Angina pectoris, unspecified: Secondary | ICD-10-CM | POA: Diagnosis not present

## 2013-03-23 DIAGNOSIS — E1142 Type 2 diabetes mellitus with diabetic polyneuropathy: Secondary | ICD-10-CM

## 2013-03-23 DIAGNOSIS — I10 Essential (primary) hypertension: Secondary | ICD-10-CM

## 2013-03-23 DIAGNOSIS — E1140 Type 2 diabetes mellitus with diabetic neuropathy, unspecified: Secondary | ICD-10-CM

## 2013-03-23 MED ORDER — TEMAZEPAM 15 MG PO CAPS: 15.0000 mg | ORAL_CAPSULE | Freq: Every evening | ORAL | Status: DC | PRN

## 2013-03-23 NOTE — Progress Notes (Signed)
Pre visit review using our clinic review tool, if applicable. No additional management support is needed unless otherwise documented below in the visit note. 

## 2013-03-23 NOTE — Assessment & Plan Note (Signed)
Chronic problem.  Pt is apparently having difficulty w/ dry weight- previously was not having enough fluid removed and this was backing up into lungs and causing strain on heart but now is having too much fluid removed and he is cramping and at risk of passing out from hypotension.  Pt to discuss this again w/ nephrology.  Will follow.

## 2013-03-23 NOTE — Assessment & Plan Note (Signed)
Pt w/ another MI Feb 2015.  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  Dose of Imdur was increased.  Will follow along.

## 2013-03-23 NOTE — Assessment & Plan Note (Signed)
Chronic problem. Most recent A1C shows excellent control.  UTD on eye exam, now getting Avastin for diabetic retinopathy.  Will continue to follow closely.

## 2013-03-23 NOTE — Patient Instructions (Signed)
Schedule your complete physical for 3 months We'll complete your diabetic shoe form and send it out Please discuss your dry weight w/ dialysis Call with any questions or concerns Hang in there!!!

## 2013-03-23 NOTE — Progress Notes (Signed)
   Subjective:    Patient ID: [name] [name], male    DOB: [birth year], 73 y.o.   MRN: [id number]  HPI CAD- pt had another MI in Feb and was told that he has small vessel disease and 'there's not much they can do'.  Imdur was increase to 60mg  BID.    HTN- chronic problem, BP well controlled today but there have been some issues w/ determining his appropriate dry weight at HD.  Initially was not having enough fluid removed and this was building up in chest and putting pressure on heart.  Now having too much fluid removed and causing excessive cramping and hypotension- pt at risk for passing out.  DM- chronic problem, had A1C done on 2/16 which showed excellent control at 5.4.  Now receiving intraocular injxns of avastin due to diabetic retinopathy.   Review of Systems For ROS see HPI     Objective:   Physical Exam  Vitals reviewed. Constitutional: He is oriented to person, place, and time. He appears well-developed and well-nourished. No distress.  HENT:  Head: Normocephalic and atraumatic.  Eyes: Conjunctivae and EOM are normal. Pupils are equal, round, and reactive to light.  Neck: Normal range of motion. Neck supple. No thyromegaly present.  Cardiovascular: Normal rate, regular rhythm, normal heart sounds and intact distal pulses.   No murmur heard. Pulmonary/Chest: Effort normal and breath sounds normal. No respiratory distress.  Abdominal: Soft. Bowel sounds are normal. He exhibits no distension.  Musculoskeletal: He exhibits no edema.  Lymphadenopathy:    He has no cervical adenopathy.  Neurological: He is alert and oriented to person, place, and time. No cranial nerve deficit.  Skin: Skin is warm and dry.  Psychiatric: He has a normal mood and affect. His behavior is normal.          Assessment & Plan:

## 2013-03-23 NOTE — Assessment & Plan Note (Signed)
Chronic problem.  Adequate control.  Ongoing chest pain (cards aware).  No med changes at this time.

## 2013-03-23 NOTE — Telephone Encounter (Signed)
Received papers for diabetic shoes/inserts for pt. Papers were completed and faxed to [facility]. Copy sent to scanning and originals mailed to pt.

## 2013-03-24 ENCOUNTER — Telehealth: Payer: Self-pay | Admitting: Family Medicine

## 2013-03-24 NOTE — Telephone Encounter (Signed)
Relevant patient education assigned to patient using Emmi. ° °

## 2013-04-01 ENCOUNTER — Telehealth: Payer: Self-pay

## 2013-04-01 NOTE — Telephone Encounter (Signed)
Relevant patient education assigned to patient using Emmi. ° °

## 2013-04-07 DIAGNOSIS — N186 End stage renal disease: Secondary | ICD-10-CM | POA: Diagnosis not present

## 2013-04-08 HISTORY — PX: PERCUTANEOUS CORONARY STENT INTERVENTION (PCI-S): SHX6016

## 2013-04-09 ENCOUNTER — Encounter: Payer: Self-pay | Admitting: Family Medicine

## 2013-04-09 DIAGNOSIS — N039 Chronic nephritic syndrome with unspecified morphologic changes: Secondary | ICD-10-CM | POA: Diagnosis not present

## 2013-04-09 DIAGNOSIS — N186 End stage renal disease: Secondary | ICD-10-CM | POA: Diagnosis not present

## 2013-04-09 DIAGNOSIS — D631 Anemia in chronic kidney disease: Secondary | ICD-10-CM | POA: Diagnosis not present

## 2013-04-09 DIAGNOSIS — E1129 Type 2 diabetes mellitus with other diabetic kidney complication: Secondary | ICD-10-CM | POA: Diagnosis not present

## 2013-04-09 MED ORDER — BENZONATATE 200 MG PO CAPS: 200.0000 mg | ORAL_CAPSULE | Freq: Three times a day (TID) | ORAL | Status: DC | PRN

## 2013-04-09 NOTE — Telephone Encounter (Signed)
Med filled.  

## 2013-04-13 ENCOUNTER — Ambulatory Visit (INDEPENDENT_AMBULATORY_CARE_PROVIDER_SITE_OTHER)
Admission: RE | Admit: 2013-04-13 | Discharge: 2013-04-13 | Disposition: A | Discharge status: Home / Self Care | Payer: Medicare Other | Source: Ambulatory Visit | Attending: Family Medicine | Admitting: Family Medicine

## 2013-04-13 ENCOUNTER — Ambulatory Visit (INDEPENDENT_AMBULATORY_CARE_PROVIDER_SITE_OTHER): Payer: Medicare Other | Admitting: Family Medicine

## 2013-04-13 ENCOUNTER — Encounter: Payer: Self-pay | Admitting: Family Medicine

## 2013-04-13 VITALS — BP 118/62 | HR 61 | Temp 98.6°F | Resp 16 | Wt 175.2 lb

## 2013-04-13 DIAGNOSIS — R059 Cough, unspecified: Secondary | ICD-10-CM

## 2013-04-13 DIAGNOSIS — I209 Angina pectoris, unspecified: Secondary | ICD-10-CM | POA: Diagnosis not present

## 2013-04-13 DIAGNOSIS — R05 Cough: Secondary | ICD-10-CM

## 2013-04-13 MED ORDER — NITROGLYCERIN 0.4 MG SL SUBL: 0.4000 mg | SUBLINGUAL_TABLET | SUBLINGUAL | Status: DC | PRN

## 2013-04-13 MED ORDER — AMOXICILLIN 500 MG PO CAPS: 500.0000 mg | ORAL_CAPSULE | Freq: Two times a day (BID) | ORAL | Status: DC

## 2013-04-13 MED ORDER — PROMETHAZINE-DM 6.25-15 MG/5ML PO SYRP: 5.0000 mL | ORAL_SOLUTION | Freq: Four times a day (QID) | ORAL | Status: DC | PRN

## 2013-04-13 NOTE — Progress Notes (Signed)
   Subjective:    Patient ID: [name] [name], male    DOB: 08-26-1940, 73 y.o.   MRN: [id number]  Cough   URI- cough is persistent, most bothersome part.  1st started 3 weeks ago.  No relief w/ Tessalon.  Having coughing fits 'that won't stop'.  Difficulty sleeping.  Cough is causing chest pain.  No sinus pain/pressure.  + HA.  Minimal nasal congestion.  No ear pain.  + sick contacts.   Review of Systems  Respiratory: Positive for cough.    For ROS see HPI     Objective:   Physical Exam  Vitals reviewed. Constitutional: He appears well-developed and well-nourished. No distress.  HENT:  Head: Normocephalic and atraumatic.  Right Ear: Tympanic membrane normal.  Left Ear: Tympanic membrane normal.  Nose: No mucosal edema or rhinorrhea. Right sinus exhibits no maxillary sinus tenderness and no frontal sinus tenderness. Left sinus exhibits no maxillary sinus tenderness and no frontal sinus tenderness.  Mouth/Throat: Mucous membranes are normal. No oropharyngeal exudate, posterior oropharyngeal edema or posterior oropharyngeal erythema.  Eyes: Conjunctivae and EOM are normal. Pupils are equal, round, and reactive to light.  Neck: Normal range of motion. Neck supple.  Cardiovascular: Normal rate, regular rhythm and normal heart sounds.   Pulmonary/Chest: Effort normal and breath sounds normal. No respiratory distress. He has no wheezes.  + hacking cough Crackles at bases bilaterally  Lymphadenopathy:    He has no cervical adenopathy.  Skin: Skin is warm and dry.          Assessment & Plan:

## 2013-04-13 NOTE — Patient Instructions (Signed)
Follow up as needed Go to [facility] and get your chest xray- we'll call you with the results Start the Amoxicillin twice daily Use the cough syrup as needed REST! Hang in there!

## 2013-04-13 NOTE — Assessment & Plan Note (Signed)
New.  Pt has had cough x3 weeks and this is resulting in chest pain and SOB.  Hx of volume overload due to problems w/ HD and now w/ crackles at lung bases.  Get CXR.  Start Amox for possible infxn.  Cough meds prn.  Reviewed supportive care and red flags that should prompt return.  Pt expressed understanding and is in agreement w/ plan.

## 2013-04-13 NOTE — Progress Notes (Signed)
Pre visit review using our clinic review tool, if applicable. No additional management support is needed unless otherwise documented below in the visit note. 

## 2013-04-23 ENCOUNTER — Other Ambulatory Visit: Payer: Self-pay | Admitting: Family Medicine

## 2013-04-23 NOTE — Telephone Encounter (Signed)
Med filled.  

## 2013-04-29 ENCOUNTER — Encounter: Payer: Self-pay | Admitting: Cardiology

## 2013-04-29 ENCOUNTER — Encounter (HOSPITAL_COMMUNITY): Payer: Self-pay | Admitting: Pharmacy Technician

## 2013-04-29 ENCOUNTER — Ambulatory Visit (INDEPENDENT_AMBULATORY_CARE_PROVIDER_SITE_OTHER): Payer: Medicare Other | Admitting: Cardiology

## 2013-04-29 VITALS — BP 146/60 | HR 64 | Ht 65.0 in | Wt 169.4 lb

## 2013-04-29 DIAGNOSIS — I25810 Atherosclerosis of coronary artery bypass graft(s) without angina pectoris: Secondary | ICD-10-CM

## 2013-04-29 DIAGNOSIS — I5030 Unspecified diastolic (congestive) heart failure: Secondary | ICD-10-CM

## 2013-04-29 DIAGNOSIS — E785 Hyperlipidemia, unspecified: Secondary | ICD-10-CM

## 2013-04-29 DIAGNOSIS — R079 Chest pain, unspecified: Secondary | ICD-10-CM

## 2013-04-29 DIAGNOSIS — I10 Essential (primary) hypertension: Secondary | ICD-10-CM

## 2013-04-29 DIAGNOSIS — I509 Heart failure, unspecified: Secondary | ICD-10-CM

## 2013-04-29 DIAGNOSIS — I209 Angina pectoris, unspecified: Secondary | ICD-10-CM

## 2013-04-29 DIAGNOSIS — I2510 Atherosclerotic heart disease of native coronary artery without angina pectoris: Secondary | ICD-10-CM

## 2013-04-29 DIAGNOSIS — Z9861 Coronary angioplasty status: Secondary | ICD-10-CM

## 2013-04-29 DIAGNOSIS — I110 Hypertensive heart disease with heart failure: Secondary | ICD-10-CM

## 2013-04-29 NOTE — Assessment & Plan Note (Signed)
This is difficult to address based on the significant differences in blood pressure revolving around his dialysis days. See above for considering possible short acting calcium channel blockers intermittently.

## 2013-04-29 NOTE — Assessment & Plan Note (Signed)
Is well-controlled as far as the LDL goes. His HDL is improving. His triglycerides are still elevated despite being on fenofibrate. Unfortunately, he is probably not be able to do any exercise nor to burn off calories. We will check followup labs in a few months.

## 2013-04-29 NOTE — Assessment & Plan Note (Signed)
At this point, with increasing frequency and intensity of angina, I am concerned that this may be some type of technical issue with the stent that was recently placed. We have to exclude the possibility of dissection, stent deployment issues, or simply another lesion. I don't have a lot of room to adjust to that for his medications. He is on 60 twice a day of Imdur and max dose beta blocker. Blood pressure medications make it difficult to add afterload reduction. Plan:  I will schedule him for afternoon case Left Heart Catheterization tomorrow after dialysis Continue dual and platelet therapy as well as max dose beta blocker.  Unfortunately his peri-dialysis hypotension precludes the use of calcium channel blockers, however we could consider shorter acting agents

## 2013-04-29 NOTE — Patient Instructions (Addendum)
Your physician has requested that you have a cardiac catheterization. Cardiac catheterization is used to diagnose and/or treat various heart conditions. Doctors may recommend this procedure for a number of different reasons. The most common reason is to evaluate chest pain. Chest pain can be a symptom of coronary artery disease (CAD), and cardiac catheterization can show whether plaque is narrowing or blocking your heart's arteries. This procedure is also used to evaluate the valves, as well as measure the blood flow and oxygen levels in different parts of your heart. For further information please visit [email or web address]. Please follow instruction sheet, as given.  SCHEDULE TOMORROW AROUND 2 PM, PATIENT IS HAVING DIALYSIS this morning, left heart cath with femoral access. Labs will be done at hospital  Your physician recommends that you schedule a follow-up appointment after cath.*

## 2013-04-29 NOTE — Progress Notes (Signed)
PCP: [name] Asa, MD  Clinic Note: Chief Complaint  Patient presents with  . Chest Pain    USE NTG X1 BUT USE IT SEVERAL TIMES A DAY EVRY SINCE LAST APPOINTMENT, NO SOB , NO EDEMA ----DIAYLSIS M-T-TH-SAT   HPI: [name] [name] is a 73 y.o. male with a Cardiovascular Problem List below who presents today as a work in patient for persistently worsening angina pain. I saw him back in March, we increased his Imdur dose. Unfortunately he cannot afford Ranexa, and with hemodialysis days, his blood pressure changes would not tolerate any significant titration of antihypertensive. At that time he has stable class II angina, however present, his symptoms are at least class III to class IV angina.  Interval History: [name] presents today quite frustrated. He is now taking nitroglycerin as many as 5 times a day this past Monday. He really can't do much of any type of activity without having angina. In fact he is having chest discomfort with walking in to the clinic room.  He did get some relief with the Imdur increase, but now is not having the same relief. Most days at think that's was released to 3 times, however at most using a 5-6 times. Do any significant activity beyond just routine walking around the house, is likely to have angina. His quality of life is significantly diminished. On his predialysis days he certainly notes orthopnea and mild PND.  He denies any rapid or irregular heartbeat, although he does occasionally feel some palpitations that don't bother him. No lightheadedness, dizziness or syncope/near-syncope. Does have some mild orthostatic symptoms on occasion. No TIA or RCA symptoms. No melena, hematochezia or hematuria. No epistaxis or claudication symptoms.  Past Medical History  Diagnosis Date  . Diabetes mellitus   . Hyperlipidemia   . Hypertension   . Gout   . Hypothyroidism     On supplementation  . LBBB (left bundle branch block)     Chronic  . History of colon cancer  2003    colectomy for CA. no recurrence . never required  radiation or chem  . Anemia     Likely secondary to history of GI bleed  . Stroke 1998  . End stage renal disease on dialysis     Dialyzes at [facility]: Tues/Th/Sat - left upper cavity AV fistula brachiocephalic  . CAD in native artery; and the grafts 1992, 1997, 2002, 2006, 2012    CABG 1992 and redo CABG 2006  . CAD (coronary artery disease) of bypass graft 2006, 2012    In 2006: Occluded SVG-OM noted (SVG-diagonal was previously occluded); 2012: Severe lesion in redo SVG-OM1 --> BMS PCI   . CAD S/P percutaneous coronary angioplasty October 2012    Non-STEMI: PCI to SVG-OM1 Integrity BMS 3.5 mm x 15 mm (3.85 mm)  . History of nuclear stress test Sept 2014    EF 39% (down from) 45%, no ischemia; but now the inferobasal perfusion defect is determined to be likley prior infarction (as opposed to diaphragmatic attenuation)  . Echocardiogram findings abnormal, without diagnosis [id number]; Oct 2014    EF 50-55%, anteroseptal hypokinesis. Mild to moderate concentric LVH. Mild MR, aortic sclerosis. Mild atrial dilatation bilaterally.; Oct - mild LVH, EF ~50-55%, incoordinate septal WM, - with no other notable WMA  . History of Non-ST elevated myocardial infarction (non-STEMI) 1992, 2006, 2012; 02/2013  . Peptic ulcer disease  2004  . BPH (benign prostatic hyperplasia)   . Chronic low back pain   .  Non-STEMI (non-ST elevated myocardial infarction) February 2015    PCI to mid RCA Xience Apling DES 2.75 mm x 15 mm (3.1 mm)    His cardiac history dates back to 1992: Former patient of Dr. Terance Ice 1992: CABG x3 (LIMA-LAD, SVG-diagonal, SVG-OM). Of these only the LIMA-LAD is patent  1997: Class IV angina, PTCA of the circumflex  2002 for class IV angina: Again PTCA to circumflex..  2006 non-STEMI: occluded vein graft to the OM1 --> redo CABG with SVG-OM1 and SVG-OM 2, with a patent LIMA from the initial surgery.  He developed  End-Stage Renal Disease in May of 2011 -- now on hemodialysis. Non-STEMI in October 2012: High-grade lesion in the SVG-OM1--> Integrity BMS 3.5 mm x 15 mm postdilated up to 3.8 mm.  Followup Nuclear Stress Test a year later did not show any evidence of ischemia or infarction just diaphragmatic attenuation.  Known Left Bundle Branch Block, but has not had any heart related symptoms in the past. ** NEW: Non-STEMI 02/23/2013: Patent vein grafts to OM1 and diagonal along with patent LIMA; mid RCA lesion treated with Xience outline DES.  Cardiovascular Procedures/Studies   Procedure Laterality Date  . Percutaneous coronary stent intervention (pci-s)  02/23/2013    mid RCA 80% & 70% - Xience 2.75 mm x 15 mm ( 3.48mm) ; LIMA-LAD patent, SVG-OM patent (stent patent); SVG-Diag patent.  . Transthoracic echocardiogram  02/23/2013    Moderate concentric hypertrophy. EF 4500%. Septal bounce. Grade 2 diastolic dysfunction (pseudo-normal - severely elevated filling pressures) mild to moderate MR and moderate LA dilation to  . Lower extremity arterial dopplers  October 2014    Calcified but non-occlusive peripheral arteries. No evidence of stenosis   . Coronary artery bypass graft  2006    SVG to OM1,SVG to OM2 with patent LIMA  to LAD and occluded vein  graft to the diagonal  and occluded vein grft to OM from  prior  surgery   . Cardiac catheterization  10/16/2010    patent  LIMA to the LAD , PATENT  svg TO 2nd marginals ,occluded PLA with collaterals and SVG to the OM  had 90% stenosis  was txlge  bare - metal  3.5  Integrity ten  postdilate  36-37  mm    . Cardiac catheterization  03/22/2004    loss of both SVG,severe disease in prox and ostial  circ not amenable to invention. mod diease distal LAD  AFTER BYPASS GRAFT;-CVTS to evaluate   . Coronary angioplasty  04/03/1995    OM and Circ  . Coronary angioplasty  02/01/2000    [facility]  . Coronary artery bypass graft  08/22/1990    INITIAL:LIMA to LAD, SVG to   Diagonal, SVG to OM    MEDICATIONS AND ALLERGIES REVIEWED IN EPIC No Change in Social and Family History  ROS: A comprehensive Review of Systems - Negative except except for symptoms noted above. He does note general fatigue and malaise. No recent illnesses just that he has no energy. He seems relatively poorly motivated  PHYSICAL EXAM BP 146/60  Pulse 64  Ht 5\' 5"  (1.651 m)  Wt 169 lb 6.4 oz (76.839 kg)  BMI 28.19 kg/m2 General appearance: alert, cooperative, appears stated age, no distress; and Borderline obese. Chronically ill-appearing, pale, well groomed.Marland Kitchen answers questions appropriately.  Neck: no adenopathy, no carotid bruit, no JVD and supple, symmetrical, trachea midline  Lungs: clear to auscultation bilaterally, normal percussion bilaterally and Nonlabored, good air movement  Heart: normal apical  impulse, RRR, S1, S2 normal, S4 present, SEM 2/6, crescendo, decrescendo and harsh at 2nd right intercostal space, no click and no rub  Abdomen: soft, non-tender; bowel sounds normal; no masses, no organomegaly and Mild truncal obesity  Extremities: extremities normal, atraumatic, no cyanosis or edema and fistula in the left upper arm with good thrill  Pulses: 2+ and symmetric  Except for right radial  Neurologic: Grossly normal  [id number] today: Yes Rate: 64 , Rhythm: NSR, LBBB; no change;  PR interval 178, QRS 158, QTC 495, Axis 5.  Recent Labs: None since hospitalization  ASSESSMENT / PLAN: Angina, now class III compared to class II within 3 months of PCI for non-STEMI  Angina, class III -  increased intensity from Class II At this point, with increasing frequency and intensity of angina, I am concerned that this may be some type of technical issue with the stent that was recently placed. We have to exclude the possibility of dissection, stent deployment issues, or simply another lesion. I don't have a lot of room to adjust to that for his medications. He is on 60 twice a  day of Imdur and max dose beta blocker. Blood pressure medications make it difficult to add afterload reduction. Plan:  I will schedule him for afternoon case Left Heart Catheterization tomorrow after dialysis Continue dual and platelet therapy as well as max dose beta blocker.  Unfortunately his peri-dialysis hypotension precludes the use of calcium channel blockers, however we could consider shorter acting agents  CAD (coronary artery disease) of bypass graft - PCI to SVG-OM with BMS; PCI-mid RCA with a Xience DES Back on dual antiplatelet therapy again. I think we'll, done this for lifelong.  He is on statin and fenofibrate along with beta blocker long-acting nitroglycerin.  Relook catheterization plus minus PCI tomorrow  HYPERTENSION This is difficult to address based on the significant differences in blood pressure revolving around his dialysis days. See above for considering possible short acting calcium channel blockers intermittently.  HYPERLIPIDEMIA Is well-controlled as far as the LDL goes. His HDL is improving. His triglycerides are still elevated despite being on fenofibrate. Unfortunately, he is probably not be able to do any exercise nor to burn off calories. We will check followup labs in a few months.  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  I am not sure that he can tolerate any more volume removal. He may be that the patient would require more frequent dialysis days with less aggressive volume removal and each time.  Unfortunately as I noted he just does not tolerate up titration of blood pressure medications for afterload reduction.   No problem-specific assessment & plan notes found for this encounter.   Orders Placed This Encounter  Procedures  . EKG 12-Lead    Order Specific Question:  Where should this test be performed    Answer:  OTHER    . LEFT HEART CATHETERIZATION WITH CORONARY/GRAFT ANGIOGRAM   No orders of the defined types were placed in this encounter.    Followup: One-2 weeks post cath   DAVID W. Ellyn Hack, M.D., M.S. Interventional Cardiologist [facility]

## 2013-04-29 NOTE — Assessment & Plan Note (Signed)
Back on dual antiplatelet therapy again. I think we'll, done this for lifelong.  He is on statin and fenofibrate along with beta blocker long-acting nitroglycerin.  Relook catheterization plus minus PCI tomorrow

## 2013-04-29 NOTE — Assessment & Plan Note (Signed)
As mentioned before his diastolic dysfunction is clearly one of the reasons for his angina.  I am not sure that he can tolerate any more volume removal. He may be that the patient would require more frequent dialysis days with less aggressive volume removal and each time.  Unfortunately as I noted he just does not tolerate up titration of blood pressure medications for afterload reduction.

## 2013-04-30 ENCOUNTER — Ambulatory Visit (HOSPITAL_COMMUNITY)
Admission: RE | Admit: 2013-04-30 | Discharge: 2013-05-01 | Disposition: A | Discharge status: Home / Self Care | Payer: Medicare Other | Source: Ambulatory Visit | Attending: Cardiology | Admitting: Cardiology

## 2013-04-30 ENCOUNTER — Encounter (HOSPITAL_COMMUNITY)
Admission: RE | Disposition: A | Discharge status: Home / Self Care | Payer: Self-pay | Source: Ambulatory Visit | Attending: Cardiology

## 2013-04-30 DIAGNOSIS — I252 Old myocardial infarction: Secondary | ICD-10-CM | POA: Diagnosis not present

## 2013-04-30 DIAGNOSIS — M545 Low back pain, unspecified: Secondary | ICD-10-CM | POA: Diagnosis not present

## 2013-04-30 DIAGNOSIS — I200 Unstable angina: Secondary | ICD-10-CM | POA: Diagnosis not present

## 2013-04-30 DIAGNOSIS — N400 Enlarged prostate without lower urinary tract symptoms: Secondary | ICD-10-CM | POA: Diagnosis not present

## 2013-04-30 DIAGNOSIS — I120 Hypertensive chronic kidney disease with stage 5 chronic kidney disease or end stage renal disease: Secondary | ICD-10-CM | POA: Diagnosis not present

## 2013-04-30 DIAGNOSIS — Z9861 Coronary angioplasty status: Secondary | ICD-10-CM | POA: Diagnosis not present

## 2013-04-30 DIAGNOSIS — I5032 Chronic diastolic (congestive) heart failure: Secondary | ICD-10-CM | POA: Diagnosis not present

## 2013-04-30 DIAGNOSIS — E1129 Type 2 diabetes mellitus with other diabetic kidney complication: Secondary | ICD-10-CM | POA: Diagnosis not present

## 2013-04-30 DIAGNOSIS — I10 Essential (primary) hypertension: Secondary | ICD-10-CM

## 2013-04-30 DIAGNOSIS — G8929 Other chronic pain: Secondary | ICD-10-CM | POA: Insufficient documentation

## 2013-04-30 DIAGNOSIS — I209 Angina pectoris, unspecified: Secondary | ICD-10-CM

## 2013-04-30 DIAGNOSIS — E039 Hypothyroidism, unspecified: Secondary | ICD-10-CM | POA: Diagnosis not present

## 2013-04-30 DIAGNOSIS — Z8673 Personal history of transient ischemic attack (TIA), and cerebral infarction without residual deficits: Secondary | ICD-10-CM | POA: Diagnosis not present

## 2013-04-30 DIAGNOSIS — I25709 Atherosclerosis of coronary artery bypass graft(s), unspecified, with unspecified angina pectoris: Secondary | ICD-10-CM | POA: Diagnosis present

## 2013-04-30 DIAGNOSIS — E1142 Type 2 diabetes mellitus with diabetic polyneuropathy: Secondary | ICD-10-CM | POA: Insufficient documentation

## 2013-04-30 DIAGNOSIS — I2510 Atherosclerotic heart disease of native coronary artery without angina pectoris: Secondary | ICD-10-CM

## 2013-04-30 DIAGNOSIS — Z992 Dependence on renal dialysis: Secondary | ICD-10-CM | POA: Diagnosis not present

## 2013-04-30 DIAGNOSIS — N186 End stage renal disease: Secondary | ICD-10-CM | POA: Diagnosis not present

## 2013-04-30 DIAGNOSIS — Z955 Presence of coronary angioplasty implant and graft: Secondary | ICD-10-CM

## 2013-04-30 DIAGNOSIS — Z951 Presence of aortocoronary bypass graft: Secondary | ICD-10-CM | POA: Insufficient documentation

## 2013-04-30 DIAGNOSIS — E785 Hyperlipidemia, unspecified: Secondary | ICD-10-CM | POA: Diagnosis not present

## 2013-04-30 DIAGNOSIS — Z85038 Personal history of other malignant neoplasm of large intestine: Secondary | ICD-10-CM | POA: Diagnosis not present

## 2013-04-30 DIAGNOSIS — E1149 Type 2 diabetes mellitus with other diabetic neurological complication: Secondary | ICD-10-CM | POA: Diagnosis not present

## 2013-04-30 DIAGNOSIS — I447 Left bundle-branch block, unspecified: Secondary | ICD-10-CM | POA: Insufficient documentation

## 2013-04-30 DIAGNOSIS — M109 Gout, unspecified: Secondary | ICD-10-CM | POA: Insufficient documentation

## 2013-04-30 DIAGNOSIS — E1140 Type 2 diabetes mellitus with diabetic neuropathy, unspecified: Secondary | ICD-10-CM

## 2013-04-30 DIAGNOSIS — I25810 Atherosclerosis of coronary artery bypass graft(s) without angina pectoris: Secondary | ICD-10-CM

## 2013-04-30 HISTORY — PX: LEFT HEART CATHETERIZATION WITH CORONARY/GRAFT ANGIOGRAM: SHX5450

## 2013-04-30 LAB — GLUCOSE, CAPILLARY
GLUCOSE-CAPILLARY: 144 mg/dL — AB (ref 70–99)
GLUCOSE-CAPILLARY: 112 mg/dL — AB (ref 70–99)
GLUCOSE-CAPILLARY: 137 mg/dL — AB (ref 70–99)

## 2013-04-30 LAB — CBC
HCT: 37.9 % — ABNORMAL LOW (ref 39.0–52.0)
Hemoglobin: 12.7 g/dL — ABNORMAL LOW (ref 13.0–17.0)
MCH: 31.2 pg (ref 26.0–34.0)
MCHC: 33.5 g/dL (ref 30.0–36.0)
MCV: 93.1 fL (ref 78.0–100.0)
Platelets: 261 10*3/uL (ref 150–400)
RBC: 4.07 MIL/uL — ABNORMAL LOW (ref 4.22–5.81)
RDW: 15.1 % (ref 11.5–15.5)
WBC: 10.9 10*3/uL — AB (ref 4.0–10.5)

## 2013-04-30 LAB — BASIC METABOLIC PANEL
BUN: 20 mg/dL (ref 6–23)
CO2: 28 mEq/L (ref 19–32)
Calcium: 9.9 mg/dL (ref 8.4–10.5)
Chloride: 96 mEq/L (ref 96–112)
Creatinine, Ser: 2.47 mg/dL — ABNORMAL HIGH (ref 0.50–1.35)
GFR calc Af Amer: 29 mL/min — ABNORMAL LOW (ref >90)
GFR, EST NON AFRICAN AMERICAN: 25 mL/min — AB (ref >90)
Glucose, Bld: 170 mg/dL — ABNORMAL HIGH (ref 70–99)
Potassium: 3.8 mEq/L (ref 3.7–5.3)
SODIUM: 140 meq/L (ref 137–147)

## 2013-04-30 LAB — POCT ACTIVATED CLOTTING TIME
Activated Clotting Time: 271 seconds
Activated Clotting Time: 310 seconds
Activated Clotting Time: 155 seconds

## 2013-04-30 LAB — PROTIME-INR
INR: 0.92 (ref 0.00–1.49)
PROTHROMBIN TIME: 12.2 s (ref 11.6–15.2)

## 2013-04-30 SURGERY — LEFT HEART CATHETERIZATION WITH CORONARY/GRAFT ANGIOGRAM
Anesthesia: LOCAL

## 2013-04-30 MED ORDER — SODIUM CHLORIDE 0.9 % IJ SOLN: 3.0000 mL | Freq: Two times a day (BID) | INTRAMUSCULAR | Status: DC

## 2013-04-30 MED ORDER — CLOPIDOGREL BISULFATE 75 MG PO TABS: 75.0000 mg | ORAL_TABLET | Freq: Every day | ORAL | Status: DC

## 2013-04-30 MED ORDER — ALLOPURINOL 100 MG PO TABS
100.0000 mg | ORAL_TABLET | Freq: Every day | ORAL | Status: DC
  Administered 2013-05-01: 09:00:00 100 mg via ORAL
  Filled 2013-04-30 (×2): qty 1

## 2013-04-30 MED ORDER — LEVOTHYROXINE SODIUM 125 MCG PO TABS
125.0000 ug | ORAL_TABLET | Freq: Every day | ORAL | Status: DC
  Administered 2013-05-01: 125 ug via ORAL
  Filled 2013-04-30 (×2): qty 1

## 2013-04-30 MED ORDER — SODIUM CHLORIDE 0.9 % IJ SOLN: 3.0000 mL | INTRAMUSCULAR | Status: DC | PRN

## 2013-04-30 MED ORDER — HEPARIN (PORCINE) IN NACL 2-0.9 UNIT/ML-% IJ SOLN
INTRAMUSCULAR | Status: AC
  Filled 2013-04-30: qty 1000

## 2013-04-30 MED ORDER — ASPIRIN 81 MG PO CHEW
81.0000 mg | CHEWABLE_TABLET | ORAL | Status: AC
  Administered 2013-04-30: 81 mg via ORAL
  Filled 2013-04-30: qty 1

## 2013-04-30 MED ORDER — ATORVASTATIN CALCIUM 40 MG PO TABS
40.0000 mg | ORAL_TABLET | Freq: Every day | ORAL | Status: DC
  Administered 2013-04-30: 22:00:00 40 mg via ORAL
  Filled 2013-04-30 (×2): qty 1

## 2013-04-30 MED ORDER — TEMAZEPAM 15 MG PO CAPS
15.0000 mg | ORAL_CAPSULE | Freq: Every evening | ORAL | Status: DC | PRN
  Administered 2013-04-30: 15 mg via ORAL
  Filled 2013-04-30: qty 1

## 2013-04-30 MED ORDER — DIAZEPAM 5 MG PO TABS
5.0000 mg | ORAL_TABLET | ORAL | Status: AC
  Administered 2013-04-30: 5 mg via ORAL
  Filled 2013-04-30: qty 1

## 2013-04-30 MED ORDER — FENTANYL CITRATE 0.05 MG/ML IJ SOLN
INTRAMUSCULAR | Status: AC
  Filled 2013-04-30: qty 2

## 2013-04-30 MED ORDER — HEPARIN (PORCINE) IN NACL 2-0.9 UNIT/ML-% IJ SOLN
INTRAMUSCULAR | Status: AC
  Filled 2013-04-30: qty 500

## 2013-04-30 MED ORDER — NITROGLYCERIN 0.4 MG SL SUBL: 0.4000 mg | SUBLINGUAL_TABLET | SUBLINGUAL | Status: DC | PRN

## 2013-04-30 MED ORDER — LOPERAMIDE HCL 2 MG PO CAPS: 2.0000 mg | ORAL_CAPSULE | Freq: Four times a day (QID) | ORAL | Status: DC | PRN

## 2013-04-30 MED ORDER — FENOFIBRATE 160 MG PO TABS
160.0000 mg | ORAL_TABLET | Freq: Every day | ORAL | Status: DC
  Administered 2013-05-01: 160 mg via ORAL
  Filled 2013-04-30 (×2): qty 1

## 2013-04-30 MED ORDER — TERAZOSIN HCL 2 MG PO CAPS
2.0000 mg | ORAL_CAPSULE | Freq: Every day | ORAL | Status: DC
  Administered 2013-04-30: 22:00:00 2 mg via ORAL
  Filled 2013-04-30 (×2): qty 1

## 2013-04-30 MED ORDER — NITROGLYCERIN 0.4 MG/SPRAY TL SOLN
Status: AC
  Filled 2013-04-30: qty 4.9

## 2013-04-30 MED ORDER — SEVELAMER CARBONATE 800 MG PO TABS
800.0000 mg | ORAL_TABLET | Freq: Three times a day (TID) | ORAL | Status: DC
  Administered 2013-05-01: 800 mg via ORAL
  Filled 2013-04-30 (×4): qty 1

## 2013-04-30 MED ORDER — LIDOCAINE-PRILOCAINE 2.5-2.5 % EX CREA
1.0000 "application " | TOPICAL_CREAM | CUTANEOUS | Status: DC | PRN
  Filled 2013-04-30: qty 5

## 2013-04-30 MED ORDER — ASPIRIN 81 MG PO CHEW
81.0000 mg | CHEWABLE_TABLET | Freq: Every day | ORAL | Status: DC
  Filled 2013-04-30: qty 1

## 2013-04-30 MED ORDER — SODIUM CHLORIDE 0.9 % IV SOLN
INTRAVENOUS | Status: DC
  Administered 2013-04-30: 12:00:00 via INTRAVENOUS

## 2013-04-30 MED ORDER — SODIUM CHLORIDE 0.9 % IV SOLN: 250.0000 mL | INTRAVENOUS | Status: DC | PRN

## 2013-04-30 MED ORDER — MORPHINE SULFATE 2 MG/ML IJ SOLN
2.0000 mg | INTRAMUSCULAR | Status: DC | PRN
  Administered 2013-04-30: 2 mg via INTRAVENOUS
  Filled 2013-04-30: qty 1

## 2013-04-30 MED ORDER — MIDAZOLAM HCL 2 MG/2ML IJ SOLN
INTRAMUSCULAR | Status: AC
  Filled 2013-04-30: qty 2

## 2013-04-30 MED ORDER — ISOSORBIDE MONONITRATE ER 60 MG PO TB24
60.0000 mg | ORAL_TABLET | Freq: Two times a day (BID) | ORAL | Status: DC
  Administered 2013-04-30 – 2013-05-01 (×2): 60 mg via ORAL
  Filled 2013-04-30 (×3): qty 1

## 2013-04-30 MED ORDER — INSULIN NPH (HUMAN) (ISOPHANE) 100 UNIT/ML ~~LOC~~ SUSP
10.0000 [IU] | Freq: Two times a day (BID) | SUBCUTANEOUS | Status: DC
  Administered 2013-04-30 – 2013-05-01 (×2): 10 [IU] via SUBCUTANEOUS
  Filled 2013-04-30: qty 10

## 2013-04-30 MED ORDER — LIDOCAINE HCL (PF) 1 % IJ SOLN
INTRAMUSCULAR | Status: AC
  Filled 2013-04-30: qty 30

## 2013-04-30 MED ORDER — NITROGLYCERIN 0.2 MG/ML ON CALL CATH LAB
INTRAVENOUS | Status: AC
  Filled 2013-04-30: qty 1

## 2013-04-30 MED ORDER — CARVEDILOL 25 MG PO TABS
25.0000 mg | ORAL_TABLET | Freq: Two times a day (BID) | ORAL | Status: DC
  Administered 2013-04-30 – 2013-05-01 (×2): 25 mg via ORAL
  Filled 2013-04-30 (×4): qty 1

## 2013-04-30 MED ORDER — RENA-VITE PO TABS
1.0000 | ORAL_TABLET | Freq: Every day | ORAL | Status: DC
  Administered 2013-04-30 – 2013-05-01 (×2): 1 via ORAL
  Filled 2013-04-30 (×2): qty 1

## 2013-04-30 NOTE — Interval H&P Note (Signed)
History and Physical Interval Note:  04/30/2013 3:38 PM  [name]  has presented today for surgery, with the diagnosis of Class III Angina with known CAD. The various methods of treatment have been discussed with the patient and family. After consideration of risks, benefits and other options for treatment, the patient has consented to  Procedure(s): LEFT HEART CATHETERIZATION WITH CORONARY/GRAFT ANGIOGRAM (N/A) plus or minus PCI as a surgical intervention .  The patient's history has been reviewed, patient examined, no change in status, stable for surgery.  I have reviewed the patient's chart and labs.  Questions were answered to the patient's satisfaction.     Leonie Man  Cath Lab Visit (complete for each Cath Lab visit)  Clinical Evaluation Leading to the Procedure:   ACS: no  Non-ACS:    Anginal Classification: CCS III  Anti-ischemic medical therapy: Maximal Therapy (2 or more classes of medications)  Non-Invasive Test Results: No non-invasive testing performed  Prior CABG: Previous CABG

## 2013-04-30 NOTE — H&P (View-Only) (Signed)
PCP: Annye Asa, MD  Clinic Note: Chief Complaint  Patient presents with  . Chest Pain    USE NTG X1 BUT USE IT SEVERAL TIMES A DAY EVRY SINCE LAST APPOINTMENT, NO SOB , NO EDEMA ----DIAYLSIS M-T-TH-SAT   HPI: [name] [name] is a 73 y.o. male with a Cardiovascular Problem List below who presents today as a work in patient for persistently worsening angina pain. I saw him back in March, we increased his Imdur dose. Unfortunately he cannot afford Ranexa, and with hemodialysis days, his blood pressure changes would not tolerate any significant titration of antihypertensive. At that time he has stable class II angina, however present, his symptoms are at least class III to class IV angina.  Interval History: [name] [name] presents today quite frustrated. He is now taking nitroglycerin as many as 5 times a day this past Monday. He really can't do much of any type of activity without having angina. In fact he is having chest discomfort with walking in to the clinic room.  He did get some relief with the Imdur increase, but now is not having the same relief. Most days at think that's was released to 3 times, however at most using a 5-6 times. Do any significant activity beyond just routine walking around the house, is likely to have angina. His quality of life is significantly diminished. On his predialysis days he certainly notes orthopnea and mild PND.  He denies any rapid or irregular heartbeat, although he does occasionally feel some palpitations that don't bother him. No lightheadedness, dizziness or syncope/near-syncope. Does have some mild orthostatic symptoms on occasion. No TIA or RCA symptoms. No melena, hematochezia or hematuria. No epistaxis or claudication symptoms.  Past Medical History  Diagnosis Date  . Diabetes mellitus   . Hyperlipidemia   . Hypertension   . Gout   . Hypothyroidism     On supplementation  . LBBB (left bundle branch block)     Chronic  . History of colon cancer  2003    colectomy for CA. no recurrence . never required  radiation or chem  . Anemia     Likely secondary to history of GI bleed  . Stroke 1998  . End stage renal disease on dialysis     Dialyzes at [facility]: Tues/Th/Sat - left upper cavity AV fistula brachiocephalic  . CAD in native artery; and the grafts 1992, 1997, 2002, 2006, 2012    CABG 1992 and redo CABG 2006  . CAD (coronary artery disease) of bypass graft 2006, 2012    In 2006: Occluded SVG-OM noted (SVG-diagonal was previously occluded); 2012: Severe lesion in redo SVG-OM1 --> BMS PCI   . CAD S/P percutaneous coronary angioplasty October 2012    Non-STEMI: PCI to SVG-OM1 Integrity BMS 3.5 mm x 15 mm (3.85 mm)  . History of nuclear stress test Sept 2014    EF 39% (down from) 45%, no ischemia; but now the inferobasal perfusion defect is determined to be likley prior infarction (as opposed to diaphragmatic attenuation)  . Echocardiogram findings abnormal, without diagnosis [id number]; Oct 2014    EF 50-55%, anteroseptal hypokinesis. Mild to moderate concentric LVH. Mild MR, aortic sclerosis. Mild atrial dilatation bilaterally.; Oct - mild LVH, EF ~50-55%, incoordinate septal WM, - with no other notable WMA  . History of Non-ST elevated myocardial infarction (non-STEMI) 1992, 2006, 2012; 02/2013  . Peptic ulcer disease  2004  . BPH (benign prostatic hyperplasia)   . Chronic low back pain   .  Non-STEMI (non-ST elevated myocardial infarction) February 2015    PCI to mid RCA Xience Apling DES 2.75 mm x 15 mm (3.1 mm)    His cardiac history dates back to 1992: Former patient of Dr. Terance Ice 1992: CABG x3 (LIMA-LAD, SVG-diagonal, SVG-OM). Of these only the LIMA-LAD is patent  1997: Class IV angina, PTCA of the circumflex  2002 for class IV angina: Again PTCA to circumflex..  2006 non-STEMI: occluded vein graft to the OM1 --> redo CABG with SVG-OM1 and SVG-OM 2, with a patent LIMA from the initial surgery.  He developed  End-Stage Renal Disease in May of 2011 -- now on hemodialysis. Non-STEMI in October 2012: High-grade lesion in the SVG-OM1--> Integrity BMS 3.5 mm x 15 mm postdilated up to 3.8 mm.  Followup Nuclear Stress Test a year later did not show any evidence of ischemia or infarction just diaphragmatic attenuation.  Known Left Bundle Branch Block, but has not had any heart related symptoms in the past. ** NEW: Non-STEMI 02/23/2013: Patent vein grafts to OM1 and diagonal along with patent LIMA; mid RCA lesion treated with Xience outline DES.  Cardiovascular Procedures/Studies   Procedure Laterality Date  . Percutaneous coronary stent intervention (pci-s)  02/23/2013    mid RCA 80% & 70% - Xience 2.75 mm x 15 mm ( 3.47mm) ; LIMA-LAD patent, SVG-OM patent (stent patent); SVG-Diag patent.  . Transthoracic echocardiogram  02/23/2013    Moderate concentric hypertrophy. EF 4500%. Septal bounce. Grade 2 diastolic dysfunction (pseudo-normal - severely elevated filling pressures) mild to moderate MR and moderate LA dilation to  . Lower extremity arterial dopplers  October 2014    Calcified but non-occlusive peripheral arteries. No evidence of stenosis   . Coronary artery bypass graft  2006    SVG to OM1,SVG to OM2 with patent LIMA  to LAD and occluded vein  graft to the diagonal  and occluded vein grft to OM from  prior  surgery   . Cardiac catheterization  10/16/2010    patent  LIMA to the LAD , PATENT  svg TO 2nd marginals ,occluded PLA with collaterals and SVG to the OM  had 90% stenosis  was txlge  bare - metal  3.5  Integrity ten  postdilate  36-37  mm    . Cardiac catheterization  03/22/2004    loss of both SVG,severe disease in prox and ostial  circ not amenable to invention. mod diease distal LAD  AFTER BYPASS GRAFT;-CVTS to evaluate   . Coronary angioplasty  04/03/1995    OM and Circ  . Coronary angioplasty  02/01/2000    [facility]  . Coronary artery bypass graft  08/22/1990    INITIAL:LIMA to LAD, SVG to   Diagonal, SVG to OM    MEDICATIONS AND ALLERGIES REVIEWED IN EPIC No Change in Social and Family History  ROS: A comprehensive Review of Systems - Negative except except for symptoms noted above. He does note general fatigue and malaise. No recent illnesses just that he has no energy. He seems relatively poorly motivated  PHYSICAL EXAM BP 146/60  Pulse 64  Ht 5\' 5"  (1.651 m)  Wt 169 lb 6.4 oz (76.839 kg)  BMI 28.19 kg/m2 General appearance: alert, cooperative, appears stated age, no distress; and Borderline obese. Chronically ill-appearing, pale, well groomed.Marland Kitchen answers questions appropriately.  Neck: no adenopathy, no carotid bruit, no JVD and supple, symmetrical, trachea midline  Lungs: clear to auscultation bilaterally, normal percussion bilaterally and Nonlabored, good air movement  Heart: normal apical  impulse, RRR, S1, S2 normal, S4 present, SEM 2/6, crescendo, decrescendo and harsh at 2nd right intercostal space, no click and no rub  Abdomen: soft, non-tender; bowel sounds normal; no masses, no organomegaly and Mild truncal obesity  Extremities: extremities normal, atraumatic, no cyanosis or edema and fistula in the left upper arm with good thrill  Pulses: 2+ and symmetric  Except for right radial  Neurologic: Grossly normal  [id number] today: Yes Rate: 64 , Rhythm: NSR, LBBB; no change;  PR interval 178, QRS 158, QTC 495, Axis 5.  Recent Labs: None since hospitalization  ASSESSMENT / PLAN: Angina, now class III compared to class II within 3 months of PCI for non-STEMI  Angina, class III -  increased intensity from Class II At this point, with increasing frequency and intensity of angina, I am concerned that this may be some type of technical issue with the stent that was recently placed. We have to exclude the possibility of dissection, stent deployment issues, or simply another lesion. I don't have a lot of room to adjust to that for his medications. He is on 60 twice a  day of Imdur and max dose beta blocker. Blood pressure medications make it difficult to add afterload reduction. Plan:  I will schedule him for afternoon case Left Heart Catheterization tomorrow after dialysis Continue dual and platelet therapy as well as max dose beta blocker.  Unfortunately his peri-dialysis hypotension precludes the use of calcium channel blockers, however we could consider shorter acting agents  CAD (coronary artery disease) of bypass graft - PCI to SVG-OM with BMS; PCI-mid RCA with a Xience DES Back on dual antiplatelet therapy again. I think we'll, done this for lifelong.  He is on statin and fenofibrate along with beta blocker long-acting nitroglycerin.  Relook catheterization plus minus PCI tomorrow  HYPERTENSION This is difficult to address based on the significant differences in blood pressure revolving around his dialysis days. See above for considering possible short acting calcium channel blockers intermittently.  HYPERLIPIDEMIA Is well-controlled as far as the LDL goes. His HDL is improving. His triglycerides are still elevated despite being on fenofibrate. Unfortunately, he is probably not be able to do any exercise nor to burn off calories. We will check followup labs in a few months.  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  I am not sure that he can tolerate any more volume removal. He may be that the patient would require more frequent dialysis days with less aggressive volume removal and each time.  Unfortunately as I noted he just does not tolerate up titration of blood pressure medications for afterload reduction.   No problem-specific assessment & plan notes found for this encounter.   Orders Placed This Encounter  Procedures  . EKG 12-Lead    Order Specific Question:  Where should this test be performed    Answer:  OTHER    . LEFT HEART CATHETERIZATION WITH CORONARY/GRAFT ANGIOGRAM   No orders of the defined types were placed in this encounter.    Followup: One-2 weeks post cath   [name] [name], M.D., M.S. Interventional Cardiologist [facility]

## 2013-04-30 NOTE — CV Procedure (Signed)
CARDIAC CATHETERIZATION and PERCUTANEOUS CORONARY INTERVENTION REPORT  NAME:  [name] [name]   MRN: [id number] DOB:  October 24, 1940   ADMIT DATE: 04/30/2013 Procedure Date: 04/30/2013  INTERVENTIONAL CARDIOLOGIST: Leonie Man, M.D., MS PRIMARY CARE PROVIDER: Annye Asa, MD PRIMARY CARDIOLOGIST: Leonie Man, MD, MS  PATIENT:  [name] [name] is a 73 y.o. male with a long-standing history of coronary disease status post first CABG in 1992 and redo CABG in 2006, as his renal disease on dialysis as well as hypertension, hyperlipidemia and diabetes. In October 2012, I placed a 3.5 mm x 15 mm integrity bare-metal stent in the vein graft to an OM branch. He did relatively well with chronic stable angina, exacerbated on his predialysis days. In February of this year, he presented with a non-STEMI and was found to have a significant lesion in the native mid RCA that was treated with a Xience DES stent 2.75 mm x 15 mm postdilated to 3.0 mm. Unfortunately, he has continued to have anginal chest pain requiring increasing quantities of Sulamyd glycerin since the day after his stent was placed. He continues to have worsening angina at least class III if not almost class IV angina requiring at least 5 doses of lactulose a day in addition to the 60 mg twice a day Imdur. He presented to the office yesterday with intractable angina having used 6 dose of nitroglycerin prior to coming to the visit. We decided the only option was to return to the Cath Lab for invasive evaluation plus minus PCI. The Appropriateness of Use Criteria Dr. was used in the situation was deemed as "nonrated", do to his previous redo bypass state. His existing grafts include LIMA-LAD, SVG-OM 2 and SVG-OM 3. He is a known occlusion of the native circumflex, LAD and right posterior lateral branch.  PRE-OPERATIVE DIAGNOSIS:    Crescendo Class III Angina  Severe known CAD with redo CABG in PCI vein grafts and native vessels.  PROCEDURES  PERFORMED:    Left Heart Catheterization with Native Coronary and Graft Angiography  Successful PCI of the distal RCA focal 90% to Moses reduced to 0% with an Integrity Resolute DES 2.25 mm x 8 mm postdilated to 2.55 mm  PROCEDURE:Consent:  Risks of procedure as well as the alternatives and risks of each were explained to the (patient/caregiver).  Consent for procedure obtained. Consent for signed by MD and patient with RN witness -- placed on chart.   PROCEDURE: The patient was brought to the [facility] in the fasting state and prepped and draped in the usual sterile fashion for Right groin or radial access. A modified Allen's test with plethysmography was performed, revealing excellent Ulnar artery collateral flow.  Sterile technique was used including antiseptics, cap, gloves, gown, hand hygiene, mask and sheet.  Skin prep: Chlorhexidine.  Time Out: Verified patient identification, verified procedure, site/side was marked, verified correct patient position, special equipment/implants available, medications/allergies/relevent history reviewed, required imaging and test results available.  Performed  Access: Right Common Femoral Artery; 5 Fr Sheath -- fluoroscopically guided, modified Seldinger technique   Diagnostic Left Heart Catheterization with Native Coronary and Graft Angiography:  5 Fr  JL4 and JR 4,  catheters advanced and exchanged over standard J-wire into the ascending aorta and used for selective coronary artery engagement.  Left Coronary Artery Angiography: JL4  Right Coronary Artery, SVG-OM 2, SVG-OM 3, and LIMA-LAD Angiography: JR 4  LV Hemodynamics: JR 4  Sheath:  Sutured in place post  PCI. To be removed in nursing unit with manual pressure held for hemostasis.  Hemodynamics:  Central Aortic / Mean Pressures: 136/47 mmHg; 78 mmHg  Left Ventricular Pressures / EDP: 137/1 mmHg; 6 mmHg  Left Ventriculography: Not  performed  Coronary Anatomy: Likely right dominant, however the right posterior lateral vessel is known to be occluded  Left Main: Very small caliber residual vessel with 50-70% stenosis.  Extensive calcification. LAD: Very small caliber vessel that is occluded after first small diagonal/S/P branch  LIMA-LAD: Widely patent graft to a very small caliber distal LAD with diffuse luminal irregularities. Left Circumflex: 100% flush occlusion at ostium  SVG-OM2: Widely patent graft with patent stent; the perfused OM branch is small and diffusely diseased but with brisk flow.  SVG-OM3: Widely patent graft that also fills a very small caliber vessels OM branch with mild diffuse disease.   RCA: Large caliber, dominant vessel with continued to have a 40-60% calcified stenosis in the proximal segment after the first bend.  There is a widely patent stent in the mid vessel. The vessel with diffuse luminal irregularities until very distally at what appears to be the prior ostium of the Right Posterior AV Groove Branch (RPAV-RPL) where there is a focal napkin ring like, calcified 95% stenosis prior to the PDA. The remainder the PDA is free of any significant disease is mild diffuse luminal irregularities  After reviewing the initial angiography, the culprit lesion was thought to be distal RCA focal 95% stenosis.  Preparation were made to proceed with PCI on this lesion.  Percutaneous Coronary Intervention:  Sheath exchanged for 6 Fr Guide: 6 Fr   AR-1 Guidewire: Cougar; with BMW as buddy wire  Lesion: Distal RCA/RPDA focal 95% calcified stenosis. Reduced to 0% post PCI. TIMI 3 flow pre-and post  Predilation Balloon #1: Mini Trek 2.0 mm x 8 mm;   12 Atm x 60 Sec,  At this point a Xience Alpine DES 2.5 mm x 8 mm was attempted to advance, but was unable to eat past across the lesion.   The decision was made to predilated lesion with a noncompliant balloon  Predilation Balloon #2: [place] Trek   2.25 mm x  8   mm; several inflations were made in between attempts to advance the 2.5 mm x 8 mm Xience Alpine DES  12 Atm x  40  Sec, 14  Atm x 45 sec.  Despite repeated inflations of the noncompliant balloon, and the use of buddy wire, the Xience stent was not able to pass across the lesion. Therefore this is was made to switch to different namebrand and a slightly smaller diameter stent to allow for passage across the lesion.  Stent: Integrity Resolute DES   2.25 mm x 27mm;   Deployed at: 16 Atm x  45  Sec,   Postdilated with Stent Balloon: 18 Atm x  45  Sec  Final Diameter = 2.5 mm   Post deployment angiography in multiple views, with and without guidewire in place revealed excellent stent deployment and lesion coverage.  There was no evidence of dissection or perforation.  MEDICATIONS:  Anesthesia:  Local Lidocaine  16 ml  Sedation:   2 mg IV Versed,  50  mcg IV fentanyl ;   Omnipaque Contrast:  245  ml  Anticoagulation:  IV Heparin  initial bolus 5500  Units ;  a second bolus of 2000 units was administered with the First ACT on 270 Sec; final ACT was 310 Sec  Anti-Platelet Agent:  the patient is on standing dose of aspirin plus Plavix   PATIENT DISPOSITION:    The patient was transferred to the PACU holding area in a hemodynamicaly stable, chest pain free condition.  The patient tolerated the procedure well, and there were no complications.  EBL:   <  10  ml  The patient was stable before, during, and after the procedure.  POST-OPERATIVE DIAGNOSIS:    Severe CAD as previously described. Graft and stents are patent.   Potential culprit lesion is a very focal, napkin ring type 95% stenosis in the distal RCA.  PLAN OF CARE:  The patient will be monitored overnight in the post procedure care unit. She the were removed with manual pressure for hemostasis.  I would continue his current home medications on discharge. The density made to pass to titrate up into hypertensive, but he was  unable to tolerate once he gets home.    He will followup with me as scheduled. I will see me within the first month, would recommend a visit with the PA/NP.   Anticipate discharge in the morning.    Noted for electronically PCP.  Leonie Man, M.D., M.S. [facility] [address] [place], [place]  [address]  [phone]  04/30/2013 5:35 PM

## 2013-04-30 NOTE — Progress Notes (Signed)
Site area: right groin  Site Prior to Removal:  Level 2  Pressure Applied For 20 MINUTES    Minutes Beginning at 19:55  Manual:   yes  Patient Status During Pull:  WNL  Post Pull Groin Site:  Level 1  Post Pull Instructions Given:  yes  Post Pull Pulses Present:  yes  Dressing Applied:  yes  Comments:

## 2013-05-01 ENCOUNTER — Encounter (HOSPITAL_COMMUNITY): Payer: Self-pay | Admitting: *Deleted

## 2013-05-01 DIAGNOSIS — I120 Hypertensive chronic kidney disease with stage 5 chronic kidney disease or end stage renal disease: Secondary | ICD-10-CM | POA: Diagnosis not present

## 2013-05-01 DIAGNOSIS — I2510 Atherosclerotic heart disease of native coronary artery without angina pectoris: Secondary | ICD-10-CM | POA: Diagnosis not present

## 2013-05-01 DIAGNOSIS — I252 Old myocardial infarction: Secondary | ICD-10-CM | POA: Diagnosis not present

## 2013-05-01 DIAGNOSIS — N186 End stage renal disease: Secondary | ICD-10-CM | POA: Diagnosis not present

## 2013-05-01 DIAGNOSIS — I25810 Atherosclerosis of coronary artery bypass graft(s) without angina pectoris: Secondary | ICD-10-CM

## 2013-05-01 DIAGNOSIS — I209 Angina pectoris, unspecified: Secondary | ICD-10-CM | POA: Diagnosis not present

## 2013-05-01 DIAGNOSIS — I200 Unstable angina: Secondary | ICD-10-CM | POA: Diagnosis not present

## 2013-05-01 DIAGNOSIS — Z951 Presence of aortocoronary bypass graft: Secondary | ICD-10-CM | POA: Diagnosis not present

## 2013-05-01 LAB — CBC
HEMATOCRIT: 34.6 % — AB (ref 39.0–52.0)
HEMOGLOBIN: 11.3 g/dL — AB (ref 13.0–17.0)
MCH: 30.8 pg (ref 26.0–34.0)
MCHC: 32.7 g/dL (ref 30.0–36.0)
MCV: 94.3 fL (ref 78.0–100.0)
Platelets: 235 10*3/uL (ref 150–400)
RBC: 3.67 MIL/uL — ABNORMAL LOW (ref 4.22–5.81)
RDW: 15.4 % (ref 11.5–15.5)
WBC: 8.8 10*3/uL (ref 4.0–10.5)

## 2013-05-01 LAB — BASIC METABOLIC PANEL
BUN: 32 mg/dL — ABNORMAL HIGH (ref 6–23)
CALCIUM: 9.6 mg/dL (ref 8.4–10.5)
CO2: 26 mEq/L (ref 19–32)
Chloride: 91 mEq/L — ABNORMAL LOW (ref 96–112)
Creatinine, Ser: 3.85 mg/dL — ABNORMAL HIGH (ref 0.50–1.35)
GFR calc Af Amer: 17 mL/min — ABNORMAL LOW (ref >90)
GFR, EST NON AFRICAN AMERICAN: 14 mL/min — AB (ref >90)
GLUCOSE: 97 mg/dL (ref 70–99)
Potassium: 4 mEq/L (ref 3.7–5.3)
Sodium: 133 mEq/L — ABNORMAL LOW (ref 137–147)

## 2013-05-01 LAB — GLUCOSE, CAPILLARY: Glucose-Capillary: 110 mg/dL — ABNORMAL HIGH (ref 70–99)

## 2013-05-01 NOTE — Care Management Note (Addendum)
  Page 1 of 1   05/01/2013     11:43:34 AM CARE MANAGEMENT NOTE 05/01/2013  Patient:  [name] [name], [name] [name]   Account Number:  [id number]  Date Initiated:  05/01/2013  Documentation initiated by:  Mariann Laster  Subjective/Objective Assessment:   Admitted with CP     Action/Plan:   CM to follow for disposition needs   Anticipated DC Date:  05/01/2013   Anticipated DC Plan:  HOME/SELF CARE         Choice offered to / List presented to:             Status of service:  Completed, signed off Medicare Important Message given?   (If response is "NO", the following Medicare IM given date fields will be blank) Date Medicare IM given:   Date Additional Medicare IM given:    Discharge Disposition:  HOME/SELF CARE  Per UR Regulation:  Reviewed for med. necessity/level of care/duration of stay  If discussed at [place] of Stay Meetings, dates discussed:    Comments:  Per handoff report "---04/30/2013 1903 by [facility]---Cathed/ intervention/ OIB documented"  No CM needs identified [employer] RN, BSN, [place], CCM 05/01/2013

## 2013-05-01 NOTE — Progress Notes (Signed)
CARDIAC REHAB PHASE I   PRE:  Rate/Rhythm: 72 SR  BP:  Supine: 150/53  Sitting:   Standing:    SaO2:   MODE:  Ambulation: 1000 ft   POST:  Rate/Rhythm: 86 SR  BP:  Supine:   Sitting: 159/37  Standing:    SaO2:  0755-0840 Pt tolerated ambulation well without c.o of cp or SOB. Pt states that he feels good this morning and is very excited that he is not having any chest pain. Pt was able to walk 1000 feet without symptoms. Pt to recliner after walk with call light in reach. Completed stent discharge education with pt. He voices understanding. Pt declines Outpt. CRP due to hemodialysis.   Rodney Langton RN 05/01/2013 8:47 AM

## 2013-05-01 NOTE — Discharge Summary (Signed)
Physician Discharge Summary     Patient ID: [name] [name] MRN: [id number] DOB/AGE: 02/10/1940 73 y.o.  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 male with a Cardiovascular Problem List below who presents today as a work in patient for persistently worsening angina pain. Dr Herbie Baltimore saw him back in March, we increased his Imdur dose. Unfortunately he cannot afford Ranexa, and with hemodialysis days, his blood pressure changes would not tolerate any significant titration of antihypertensive. At that time he has stable class II angina, however present, his symptoms are at least class III to class IV angina.   Interval History: [name] [name] presents today quite frustrated. He is now taking nitroglycerin as many as 5 times a day this past Monday. He really can't do much of any type of activity without having angina. In fact he is having chest discomfort with walking in to the clinic room. He did get some relief with the Imdur increase, but now is not having the same relief. Most days at think that's was released to 3 times, however at most using a 5-6 times. Do any significant activity beyond just routine walking around the house, is likely to have angina. His quality of life is significantly diminished. On his predialysis days he certainly notes orthopnea and mild PND.   He denied any rapid or irregular heartbeat,  although he does occasionally feel some palpitations that don't bother him. No lightheadedness, dizziness or syncope/near-syncope. Does have some mild orthostatic symptoms on occasion. No TIA or RCA symptoms. No melena, hematochezia or hematuria. No epistaxis or claudication symptoms.  He was admitted to the hospital and underwent left heart cath which revealed severe CAD as previously described. Graft and stents are patent.   Potential culprit lesion is a very focal, napkin ring type 95% stenosis in the distal RCA.  Despite repeated inflations of the noncompliant balloon, and the use of buddy wire, the Xience stent was not able to pass across the lesion. Therefore it was decided to switch to different brand and a slightly smaller diameter stent to allow for passage across the lesion.  An Integrity Resolute DES 2.25 mm x 75mm was deployed at: 16 Atm x 45 Sec,  Postdilated with Stent Balloon: 18 Atm x 45 Sec. Final Diameter = 2.5 mm.  We continued current medical therapy with ASA and Plavix./  The patient was seen by Dr. Katrinka Blazing who felt he was stable for DC home.   Consults: None  Significant Diagnostic Studies:  04/30/13,  coronary angiography   Access: Right Common Femoral Artery; 5 Fr Sheath -- fluoroscopically guided, modified Seldinger technique  Diagnostic Left Heart Catheterization with Native Coronary and Graft Angiography: 5 Fr JL4 and JR 4, catheters advanced and exchanged over standard J-wire into the ascending aorta and used for selective coronary artery engagement.  Left Coronary Artery Angiography: JL4  Right Coronary Artery, SVG-OM 2, SVG-OM  3, and LIMA-LAD Angiography: JR 4  LV Hemodynamics: JR 4 Sheath: Sutured in place post PCI. To be removed in nursing unit with manual pressure held for hemostasis.  Hemodynamics:  Central Aortic / Mean Pressures: 136/47 mmHg; 78 mmHg  Left Ventricular Pressures / EDP: 137/1 mmHg; 6 mmHg Left Ventriculography: Not performed  Coronary Anatomy: Likely  right dominant, however the right posterior lateral vessel is known to be occluded  Left Main: Very small caliber residual vessel with 50-70% stenosis. Extensive calcification. LAD: Very small caliber vessel that is occluded after first small diagonal/S/P branch  LIMA-LAD: Widely patent graft to a very small caliber distal LAD with diffuse luminal irregularities. Left Circumflex: 100% flush occlusion at ostium  SVG-OM2: Widely patent graft with patent stent; the perfused OM branch is small and diffusely diseased but with brisk flow.  SVG-OM3: Widely patent graft that also fills a very small caliber vessels OM branch with mild diffuse disease. RCA: Large caliber, dominant vessel with continued to have a 40-60% calcified stenosis in the proximal segment after the first bend. There is a widely patent stent in the mid vessel. The vessel with diffuse luminal irregularities until very distally at what appears to be the prior ostium of the Right Posterior AV Groove Branch (RPAV-RPL) where there is a focal napkin ring like, calcified 95% stenosis prior to the PDA. The remainder the PDA is free of any significant disease is mild diffuse luminal irregularities  After reviewing the initial angiography, the culprit lesion was thought to be distal RCA focal 95% stenosis. Preparation were made to proceed with PCI on this lesion.  Percutaneous Coronary Intervention: Sheath exchanged for 6 Fr  Guide: 6 Fr AR-1 Guidewire: Cougar; with BMW as buddy wire  Lesion: Distal RCA/RPDA focal 95% calcified stenosis. Reduced to 0% post PCI. TIMI 3 flow pre-and post  Predilation Balloon #1: Mini Trek 2.0 mm x 8 mm;  12 Atm x 60 Sec,  At this point a Xience Alpine DES 2.5 mm x 8 mm was attempted to advance, but was unable to eat past across the lesion.  The decision was made to predilated lesion with a noncompliant balloon Predilation Balloon #2: [place] Trek 2.25 mm x 8 mm; several inflations were made in between attempts to advance  the 2.5 mm x 8 mm Xience Alpine DES  12 Atm x 40 Sec, 14 Atm x 45 sec.  Despite repeated inflations of the noncompliant balloon, and the use of buddy wire, the Xience stent was not able to pass across the lesion. Therefore this is was made to switch to different namebrand and a slightly smaller diameter stent to allow for passage across the lesion. Stent: Integrity Resolute DES 2.25 mm x 17mm;  Deployed at: 16 Atm x 45 Sec,  Postdilated with Stent Balloon: 18 Atm x 45 Sec Final Diameter = 2.5 mm  Post deployment angiography in multiple views, with and without guidewire in place revealed excellent stent deployment and lesion coverage. There was no evidence of dissection or perforation.  MEDICATIONS:  Anesthesia: Local Lidocaine 16 ml Sedation: 2 mg IV Versed, 50 mcg IV fentanyl ;  Omnipaque Contrast: 245 ml  Anticoagulation: IV Heparin initial bolus 5500 Units ; a second bolus of 2000 units was administered with the First ACT on 270 Sec; final ACT was 310 Sec  Anti-Platelet Agent: the patient is on standing dose of aspirin plus Plavix  PATIENT DISPOSITION:  The patient was transferred to the PACU holding area in a hemodynamicaly stable, chest  pain free condition.  The patient tolerated the procedure well, and there were no complications. EBL: < 10 ml  The patient was stable before, during, and after the procedure. POST-OPERATIVE DIAGNOSIS:  Severe CAD as previously described. Graft and stents are patent.  Potential culprit lesion is a very focal, napkin ring type 95% stenosis in the distal RCA. PLAN OF CARE:  The patient will be monitored overnight in the post procedure care unit. She the were removed with manual pressure for hemostasis.  I would continue his current home medications on discharge. The density made to pass to titrate up into hypertensive, but he was unable to tolerate once he gets home.  He will followup with me as scheduled. I will see me within the first month, would recommend  a visit with the PA/NP.  Anticipate discharge in the morning.  Noted for electronically PCP.  Marykay Lex, M.D., M.S.  [facility]  [address]  [place], [place] [address]  [phone]  04/30/2013   Treatments: See above  Discharge Exam: Blood pressure 127/42, pulse 59, temperature 97.6 F (36.4 C), temperature source Oral, resp. rate 17, height 5\' 5"  (1.651 m), weight 166 lb 7.2 oz (75.5 kg), SpO2 100.00%.   Disposition: 01-Home or Self Care      Discharge Orders   Future Appointments Provider Department Dept Phone   05/15/2013 8:45 AM 07/15/2013, MD [facility] [phone]   05/15/2013 9:30 AM Brittainy 07/15/2013 [facility]   06/22/2013 1:45 PM 06/24/2013, MD [facility] [phone]   06/24/2013 8:30 AM 06/26/2013, MD [facility]  [place] [phone]   Future Orders Complete By Expires   Diet - low sodium heart healthy  As directed    Discharge instructions  As directed    Increase activity slowly  As directed        Medication List         allopurinol 100 MG tablet  Commonly known as:  ZYLOPRIM  Take 100 mg by mouth daily.     aspirin 81 MG chewable tablet  Chew 81 mg by mouth at bedtime.     atorvastatin 40 MG tablet  Commonly known as:  LIPITOR  Take 40 mg by mouth at bedtime.     BESIVANCE 0.6 % Susp  Generic drug:  Besifloxacin HCl  Place 1 drop into both eyes See admin instructions. Use eye drops 4 times daily for 2 days following injection by Dr. [phone] (once a month)     carvedilol 25 MG tablet  Commonly known as:  COREG  Take 25 mg by mouth 2 (two) times daily with a meal.     clopidogrel 75 MG tablet  Commonly known as:  PLAVIX  Take 75 mg by mouth at bedtime.     fenofibrate 160 MG tablet  Take 1 tablet (160 mg total) by mouth daily.     glucose blood test strip  Commonly known as:  PRODIGY NO  CODING BLOOD GLUC  Test as directed     isosorbide mononitrate 60 MG 24 hr tablet  Commonly known as:  IMDUR  Take 1 tablet (60 mg total) by mouth 2 (two) times daily.     levothyroxine 125 MCG tablet  Commonly known as:  SYNTHROID, LEVOTHROID  Take 125 mcg by mouth daily before breakfast.     lidocaine-prilocaine cream  Commonly known as:  EMLA  Apply 1  application topically as needed (prior to dialysis treatments).     loperamide 2 MG capsule  Commonly known as:  IMODIUM  Take 2 mg by mouth 4 (four) times daily as needed for diarrhea or loose stools.     multivitamin Tabs tablet  Take 1 tablet by mouth daily.     nitroGLYCERIN 0.4 MG SL tablet  Commonly known as:  NITROSTAT  Place 1 tablet (0.4 mg total) under the tongue every 5 (five) minutes as needed for chest pain.     NOVOLIN N 100 UNIT/ML injection  Generic drug:  insulin NPH Human  Inject 10-20 Units into the skin 2 (two) times daily before a meal. Per sliding scale     sevelamer carbonate 800 MG tablet  Commonly known as:  RENVELA  Take 800 mg by mouth 3 (three) times daily with meals.     temazepam 15 MG capsule  Commonly known as:  RESTORIL  Take 1 capsule (15 mg total) by mouth at bedtime as needed for sleep.     terazosin 2 MG capsule  Commonly known as:  HYTRIN  Take 2 mg by mouth at bedtime.       Follow-up Information   Follow up with Lyda Jester, PA-C On 05/15/2013. (9:30AM)    Specialty:  Cardiology   Contact information:   [place]. [place] [address] [phone]       Signed: Tarri Fuller 05/01/2013, 8:15 AM

## 2013-05-01 NOTE — Discharge Summary (Signed)
Please see my earlier note. Agree with content and discharge planning noted above.

## 2013-05-01 NOTE — Progress Notes (Signed)
       Patient Name: [name] [name] Date of Encounter: 05/01/2013  SUBJECTIVE:  No angina today but has not ambulated.   TELEMETRY:  NSR Filed Vitals:   04/30/13 1815 04/30/13 2000 05/01/13 0012 05/01/13 0424  BP: 149/50 157/51 118/48 127/42  Pulse:   61 59  Temp:   97.8 F (36.6 C) 97.6 F (36.4 C)  TempSrc:   Oral Oral  Resp:  18 18 17   Height:      Weight:   166 lb 7.2 oz (75.5 kg)   SpO2:  96% 94% 100%    Intake/Output Summary (Last 24 hours) at 05/01/13 0747 Last data filed at 04/30/13 2053  Gross per 24 hour  Intake    250 ml  Output      0 ml  Net    250 ml   LABS: Basic Metabolic Panel:  Recent Labs  04/30/13 1217 05/01/13 0400  NA 140 133*  K 3.8 4.0  CL 96 91*  CO2 28 26  GLUCOSE 170* 97  BUN 20 32*  CREATININE 2.47* 3.85*  CALCIUM 9.9 9.6   CBC:  Recent Labs  04/30/13 1217 05/01/13 0400  WBC 10.9* 8.8  HGB 12.7* 11.3*  HCT 37.9* 34.6*  MCV 93.1 94.3  PLT 261 235    Radiology/Studies:  Not reviewed  Physical Exam: Blood pressure 127/42, pulse 59, temperature 97.6 F (36.4 C), temperature source Oral, resp. rate 17, height 5\' 5"  (1.651 m), weight 166 lb 7.2 oz (75.5 kg), SpO2 100.00%. Weight change:   Wt Readings from Last 3 Encounters:  05/01/13 166 lb 7.2 oz (75.5 kg)  05/01/13 166 lb 7.2 oz (75.5 kg)  04/29/13 169 lb 6.4 oz (76.839 kg)    Right groin ecchymosis. Chest clerar Cardiac with S4 gallop Extremities with no edema  ASSESSMENT:  1. S/p difficult PCI of distal RCA with nice final result. 2. S/p CABG x 2 3. ESRD on chronic hemodialysis  Plan:  1. Ambulate 2. Home today if no problems on same med regimen.  Signed, Belva Crome III 05/01/2013, 7:47 AM

## 2013-05-07 DIAGNOSIS — N186 End stage renal disease: Secondary | ICD-10-CM | POA: Diagnosis not present

## 2013-05-08 DIAGNOSIS — D631 Anemia in chronic kidney disease: Secondary | ICD-10-CM | POA: Diagnosis not present

## 2013-05-08 DIAGNOSIS — N186 End stage renal disease: Secondary | ICD-10-CM | POA: Diagnosis not present

## 2013-05-08 DIAGNOSIS — E1129 Type 2 diabetes mellitus with other diabetic kidney complication: Secondary | ICD-10-CM | POA: Diagnosis not present

## 2013-05-08 DIAGNOSIS — N039 Chronic nephritic syndrome with unspecified morphologic changes: Secondary | ICD-10-CM | POA: Diagnosis not present

## 2013-05-15 ENCOUNTER — Encounter (INDEPENDENT_AMBULATORY_CARE_PROVIDER_SITE_OTHER): Payer: Medicare Other | Admitting: Ophthalmology

## 2013-05-15 ENCOUNTER — Ambulatory Visit: Payer: Medicare Other | Admitting: Cardiology

## 2013-05-15 DIAGNOSIS — H43819 Vitreous degeneration, unspecified eye: Secondary | ICD-10-CM

## 2013-05-15 DIAGNOSIS — I10 Essential (primary) hypertension: Secondary | ICD-10-CM

## 2013-05-15 DIAGNOSIS — H35039 Hypertensive retinopathy, unspecified eye: Secondary | ICD-10-CM

## 2013-05-15 DIAGNOSIS — E1165 Type 2 diabetes mellitus with hyperglycemia: Secondary | ICD-10-CM

## 2013-05-15 DIAGNOSIS — E11319 Type 2 diabetes mellitus with unspecified diabetic retinopathy without macular edema: Secondary | ICD-10-CM | POA: Diagnosis not present

## 2013-05-15 DIAGNOSIS — E11311 Type 2 diabetes mellitus with unspecified diabetic retinopathy with macular edema: Secondary | ICD-10-CM | POA: Diagnosis not present

## 2013-05-15 DIAGNOSIS — E1139 Type 2 diabetes mellitus with other diabetic ophthalmic complication: Secondary | ICD-10-CM

## 2013-05-19 ENCOUNTER — Encounter: Payer: Self-pay | Admitting: Cardiology

## 2013-05-19 ENCOUNTER — Ambulatory Visit (INDEPENDENT_AMBULATORY_CARE_PROVIDER_SITE_OTHER): Payer: Medicare Other | Admitting: Cardiology

## 2013-05-19 VITALS — BP 102/60 | HR 72 | Ht 65.0 in | Wt 165.0 lb

## 2013-05-19 DIAGNOSIS — I209 Angina pectoris, unspecified: Secondary | ICD-10-CM | POA: Diagnosis not present

## 2013-05-19 DIAGNOSIS — I25810 Atherosclerosis of coronary artery bypass graft(s) without angina pectoris: Secondary | ICD-10-CM

## 2013-05-19 NOTE — Patient Instructions (Signed)
1. Continue taking your meds as you are now, including your ntg that you are using for chest pain  2. Your physician recommends that you keep your already scheduled appt. To see Dr. Ellyn Hack

## 2013-05-19 NOTE — Assessment & Plan Note (Signed)
Unfortunately, he continues to have angina and requires frequent use of SL NTG, although not as much as prior to cath. His pain is usually relieved after one dose of NTG. Unfortunately, we are at a standstill with increasing his antianginals due to dialysis related hypotension, and Ranaex is not an option due to cost. I have discussed case with Dr. Gwenlyn Found. He has reviewed Dr. Allison Quarry most recent cath note. He also agrees that due to hypotension, there are little options to change his meds. Given there are no other target lesions (based on recent cath), the only recommendations we have is to continue current meds, including continued use of SL NTG. Will discuss case with office pharmacist as well to see if there are any payment plans regarding Ranexa.

## 2013-05-19 NOTE — Progress Notes (Signed)
Patient ID: [name] [name], male   DOB: [birth year], 73 y.o.   MRN: [id number]    05/19/2013 HAWK TAUFA   [birth year]  [id number]  Primary Physicia Annye Asa, MD Primary Cardiologist: Dr. Ellyn Hack  HPI:  [name] [name] is a 73 y.o. male with a long-standing history of coronary disease, status post first CABG in 1992 and redo CABG in 2006. He also has renal disease, on dialysis, as well as hypertension, hyperlipidemia and diabetes. In October 2012, Dr. Ellyn Hack  placed a 3.5 mm x 15 mm integrity bare-metal stent in the vein graft to an OM branch. He did relatively well with chronic stable angina, exacerbated on his predialysis days. In February of this year, he presented with a non-STEMI and was found to have a significant lesion in the native mid RCA that was treated with a Xience DES stent 2.75 mm x 15 mm postdilated to 3.0 mm. Unfortunately, he continued to have anginal chest pain requiring increasing quantities of SL NTG. Unfortunately, he can not tolerate additional titration of his antianginals, due to hypotension during dialysis. He has also tried Ranexa, however this was discontinued due to cost (Medicare Part D patient). Subsequently, he was taken back to the cath lab on 04/30/13 for repeat assessment of his coronaries. The procedure was performed by Dr. Ellyn Hack. His grafts and stents were patent. However, a potential culprit lesion was felt to be a very focal, napkin ring type 95% stenosis in the distal RCA. This was treated with PCP + DES. He was continued on DAPT with ASA + Plavix, 25 mg of Coreg BID, 60 mg of Imdur BID, lipitor and fenofibrate.  He presents to clinic today for post hospital f/u. He has improved slightly. Unfortunately he continues to use SL NTG almost  every other day. Prior to his recent LHC, he would have to take several SL NTG at a time. His current pain is usually relieved after only taking one dose. He also continues to note weakness and fatigue, related to dialysis  and subsequent hypotension. He reports daily compliance with all of his prescribed medications.    Current Outpatient Prescriptions  Medication Sig Dispense Refill  . allopurinol (ZYLOPRIM) 100 MG tablet Take 100 mg by mouth daily.      Marland Kitchen aspirin 81 MG chewable tablet Chew 81 mg by mouth at bedtime.      Marland Kitchen atorvastatin (LIPITOR) 40 MG tablet Take 40 mg by mouth at bedtime.      Marland Kitchen Besifloxacin HCl (BESIVANCE) 0.6 % SUSP Place 1 drop into both eyes See admin instructions. Use eye drops 4 times daily for 2 days following injection by Dr. Zigmund Daniel (once a month)      . carvedilol (COREG) 25 MG tablet Take 25 mg by mouth 2 (two) times daily with a meal.      . clopidogrel (PLAVIX) 75 MG tablet Take 75 mg by mouth at bedtime.      . fenofibrate 160 MG tablet Take 1 tablet (160 mg total) by mouth daily.  30 tablet  6  . glucose blood (PRODIGY NO CODING BLOOD GLUC) test strip Test as directed  100 each  6  . insulin NPH Human (NOVOLIN N) 100 UNIT/ML injection Inject 10-20 Units into the skin 2 (two) times daily before a meal. Per sliding scale      . isosorbide mononitrate (IMDUR) 60 MG 24 hr tablet Take 1 tablet (60 mg total) by mouth 2 (two) times daily.  60 tablet  3  .  levothyroxine (SYNTHROID, LEVOTHROID) 125 MCG tablet Take 125 mcg by mouth daily before breakfast.      . lidocaine-prilocaine (EMLA) cream Apply 1 application topically as needed (prior to dialysis treatments).      . loperamide (IMODIUM) 2 MG capsule Take 2 mg by mouth 4 (four) times daily as needed for diarrhea or loose stools.       . multivitamin (RENA-VIT) TABS tablet Take 1 tablet by mouth daily.  90 tablet  3  . nitroGLYCERIN (NITROSTAT) 0.4 MG SL tablet Place 1 tablet (0.4 mg total) under the tongue every 5 (five) minutes as needed for chest pain.  50 tablet  3  . sevelamer carbonate (RENVELA) 800 MG tablet Take 800 mg by mouth 3 (three) times daily with meals.      . temazepam (RESTORIL) 15 MG capsule Take 1 capsule (15 mg  total) by mouth at bedtime as needed for sleep.  30 capsule  3  . terazosin (HYTRIN) 2 MG capsule Take 2 mg by mouth at bedtime.       No current facility-administered medications for this visit.    Allergies  Allergen Reactions  . Shellfish Allergy     Causes gout flare-ups    History   Social History  . Marital Status: Married    Spouse Name: N/A    Number of Children: 3  . Years of Education: N/A   Occupational History  . Not on file.   Social History Main Topics  . Smoking status: Never Smoker   . Smokeless tobacco: Never Used  . Alcohol Use: No  . Drug Use: No  . Sexual Activity: Not on file   Other Topics Concern  . Not on file   Social History Narrative   Long-term patient of Dr. Rollene Fare.   Married father of 65, grandfather 23.   Never smoked.   Not exercising D2 hip and back pain & now Angina     Review of Systems: General: negative for chills, fever, night sweats or weight changes.  Cardiovascular: negative for chest pain, dyspnea on exertion, edema, orthopnea, palpitations, paroxysmal nocturnal dyspnea or shortness of breath Dermatological: negative for rash Respiratory: negative for cough or wheezing Urologic: negative for hematuria Abdominal: negative for nausea, vomiting, diarrhea, bright red blood per rectum, melena, or hematemesis Neurologic: negative for visual changes, syncope, or dizziness All other systems reviewed and are otherwise negative except as noted above.    Blood pressure 102/60, pulse 72, height 5\' 5"  (1.651 m), weight 165 lb (74.844 kg).  General appearance: alert and cooperative Lungs: clear to auscultation bilaterally Heart: regular rate and rhythm, S1, S2 normal, no murmur, click, rub or gallop Extremities: no LEE Pulses: 2+ and symmetric Skin: Skin color, texture, turgor normal. No rashes or lesions Neurologic: Grossly normal  EKG Not performed  ASSESSMENT AND PLAN:   CAD (coronary artery disease) of bypass graft -  PCI to SVG-OM with BMS; PCI-mid RCA with a Xience DES Unfortunately, he continues to have angina and requires frequent use of SL NTG, although not as much as prior to cath. His pain is usually relieved after one dose of NTG. Unfortunately, we are at a standstill with increasing his antianginals due to dialysis related hypotension, and Ranaex is not an option due to cost. I have discussed case with Dr. Gwenlyn Found. He has reviewed Dr. Allison Quarry most recent cath note. He also agrees that due to hypotension, there are little options to change his meds. Given there are no other target  lesions (based on recent cath), the only recommendations we have is to continue current meds, including continued use of SL NTG. Will discuss case with office pharmacist as well to see if there are any payment plans regarding Ranexa.     PLAN  No change in treatment. Continue current medical regimen. He states he has plenty of refills for SL NTG. F/u with Dr. Ellyn Hack on 06/22/13.  Brittainy SimmonsPA-C 05/19/2013 6:33 PM

## 2013-06-07 DIAGNOSIS — N186 End stage renal disease: Secondary | ICD-10-CM | POA: Diagnosis not present

## 2013-06-09 DIAGNOSIS — D631 Anemia in chronic kidney disease: Secondary | ICD-10-CM | POA: Diagnosis not present

## 2013-06-09 DIAGNOSIS — N186 End stage renal disease: Secondary | ICD-10-CM | POA: Diagnosis not present

## 2013-06-09 DIAGNOSIS — N2581 Secondary hyperparathyroidism of renal origin: Secondary | ICD-10-CM | POA: Diagnosis not present

## 2013-06-09 DIAGNOSIS — N039 Chronic nephritic syndrome with unspecified morphologic changes: Secondary | ICD-10-CM | POA: Diagnosis not present

## 2013-06-09 DIAGNOSIS — E1129 Type 2 diabetes mellitus with other diabetic kidney complication: Secondary | ICD-10-CM | POA: Diagnosis not present

## 2013-06-15 ENCOUNTER — Other Ambulatory Visit: Payer: Self-pay | Admitting: Family Medicine

## 2013-06-15 NOTE — Telephone Encounter (Signed)
Med filled.  

## 2013-06-19 DIAGNOSIS — L821 Other seborrheic keratosis: Secondary | ICD-10-CM | POA: Diagnosis not present

## 2013-06-19 DIAGNOSIS — D239 Other benign neoplasm of skin, unspecified: Secondary | ICD-10-CM | POA: Diagnosis not present

## 2013-06-19 DIAGNOSIS — L570 Actinic keratosis: Secondary | ICD-10-CM | POA: Diagnosis not present

## 2013-06-19 DIAGNOSIS — I781 Nevus, non-neoplastic: Secondary | ICD-10-CM | POA: Diagnosis not present

## 2013-06-22 ENCOUNTER — Ambulatory Visit (INDEPENDENT_AMBULATORY_CARE_PROVIDER_SITE_OTHER): Payer: Medicare Other | Admitting: Cardiology

## 2013-06-22 ENCOUNTER — Encounter: Payer: Self-pay | Admitting: Cardiology

## 2013-06-22 VITALS — BP 132/50 | HR 61 | Ht 65.0 in | Wt 174.2 lb

## 2013-06-22 DIAGNOSIS — N186 End stage renal disease: Secondary | ICD-10-CM

## 2013-06-22 DIAGNOSIS — I110 Hypertensive heart disease with heart failure: Secondary | ICD-10-CM | POA: Diagnosis not present

## 2013-06-22 DIAGNOSIS — E1142 Type 2 diabetes mellitus with diabetic polyneuropathy: Secondary | ICD-10-CM

## 2013-06-22 DIAGNOSIS — E785 Hyperlipidemia, unspecified: Secondary | ICD-10-CM

## 2013-06-22 DIAGNOSIS — I509 Heart failure, unspecified: Secondary | ICD-10-CM | POA: Diagnosis not present

## 2013-06-22 DIAGNOSIS — I214 Non-ST elevation (NSTEMI) myocardial infarction: Secondary | ICD-10-CM | POA: Diagnosis not present

## 2013-06-22 DIAGNOSIS — Z9861 Coronary angioplasty status: Secondary | ICD-10-CM

## 2013-06-22 DIAGNOSIS — I209 Angina pectoris, unspecified: Secondary | ICD-10-CM

## 2013-06-22 DIAGNOSIS — I5030 Unspecified diastolic (congestive) heart failure: Secondary | ICD-10-CM

## 2013-06-22 DIAGNOSIS — I5042 Chronic combined systolic (congestive) and diastolic (congestive) heart failure: Secondary | ICD-10-CM

## 2013-06-22 DIAGNOSIS — E1149 Type 2 diabetes mellitus with other diabetic neurological complication: Secondary | ICD-10-CM

## 2013-06-22 DIAGNOSIS — I2510 Atherosclerotic heart disease of native coronary artery without angina pectoris: Secondary | ICD-10-CM

## 2013-06-22 DIAGNOSIS — I25810 Atherosclerosis of coronary artery bypass graft(s) without angina pectoris: Secondary | ICD-10-CM

## 2013-06-22 DIAGNOSIS — I200 Unstable angina: Secondary | ICD-10-CM

## 2013-06-22 DIAGNOSIS — E1140 Type 2 diabetes mellitus with diabetic neuropathy, unspecified: Secondary | ICD-10-CM

## 2013-06-22 DIAGNOSIS — I10 Essential (primary) hypertension: Secondary | ICD-10-CM

## 2013-06-22 NOTE — Patient Instructions (Signed)
MAY TAKE 30 MG IMDUR (ISOSORBIDE MN) PRE DIALYSIS NIGHT  MAY  CONTINUE HOLDING [place]  Your physician wants you to follow-up in [place].  You will receive a reminder letter in the mail two months in advance. If you don't receive a letter, please call our office to schedule the follow-up appointment.

## 2013-06-24 ENCOUNTER — Encounter: Payer: Self-pay | Admitting: Cardiology

## 2013-06-24 ENCOUNTER — Encounter: Payer: Medicare Other | Admitting: Family Medicine

## 2013-06-24 NOTE — Assessment & Plan Note (Signed)
Well controlled on current management ?

## 2013-06-24 NOTE — Assessment & Plan Note (Signed)
This is probably the cause of his angina as described. Is a good regimen he does have some systolic dysfunction but mostly is diastolic. We are really at the mercy of his renal replacement therapy.

## 2013-06-24 NOTE — Assessment & Plan Note (Signed)
His be on a statin. He has high triglycerides but cannot tolerate fenofibrate.  I'm not sure what to make of the left he had during his hospitalization and the LDL as low as it was. Will recheck CMP and lipids before next visit.

## 2013-06-24 NOTE — Assessment & Plan Note (Signed)
Notably improved angina following his most recent PCI. I think that that the lesion was not as well-visualized when he presented in February. At this point I think he probably has angina from his existing disease with multivessel disease and did not have any more PCI amenable target based on last catheterization. Continue aspirin plus Plavix along with beta blocker, statin.

## 2013-06-24 NOTE — Assessment & Plan Note (Signed)
Monitored by PCP. On insulin.

## 2013-06-24 NOTE — Assessment & Plan Note (Signed)
At this point I think he is chronic and stable. It really seems that is probably related to increased LV wall SVG overload on predialysis days.  I think at this point we will increase his Imdur dose on his predialysis days to avoid any excess nitroglycerin. I do agree with still trying to get up to a lower dry weight elevated but slowly. Continue current dose of beta blocker.

## 2013-06-24 NOTE — Progress Notes (Signed)
PCP: Annye Asa, MD  Clinic Note: Chief Complaint  Patient presents with  . 3 month visit    chest pain with relief of NTG  , no sob , no edema    HPI: [name] [name] is a 73 y.o. male with a Cardiovascular Problem List below who presents today for first M.D. visit post PCI. In April he was brought to the Cath Lab for evaluation of class III and 4 age is indeterminate a focal distal RCA lesion not seen on his previous cath in February. That was stented with a single short DES stent albeit very difficult.  He initially called up with Ellen Henri, PA-C in May. He noted some symptomatic improvement but still is using nitroglycerin with notably less frequency.  Interval History: He presents today again stating that he actually feels better than he did prior to the last cavity was taken vasculature the lungs every day and going to bottle and couple days. He is now maybe doing 1 or 2 doses of nitroglycerin a week. He usually notes his anginal pain being on his predialysis evenings. It's usually late at night. It is not necessarily exertional it less as it particular time normal. It is not as severe and is not as limiting. He denies having as much dyspnea associated with his angina. He says it is gradually trying to dry weight, but can only get too far because of his cramping at the orthostasis increased to dry. He doesn't really know exertional angina. On his predialysis days he does note mild orthopnea and PND but no significant edema. No rapid irregular heartbeats, syncope/near syncope, TIAs with amaurosis fugax. No melena, hematochezia or hematuria, epistaxis.    Past Medical History  Diagnosis Date  . Diabetes mellitus   . Hyperlipidemia   . Hypertension   . Gout   . Hypothyroidism     On supplementation  . LBBB (left bundle branch block)     Chronic  . History of colon cancer 2003    colectomy for CA. no recurrence . never required  radiation or chem  . Anemia     Likely  secondary to history of GI bleed  . Stroke 1998  . End stage renal disease on dialysis     Dialyzes at [facility]: Tues/Th/Sat - left upper cavity AV fistula brachiocephalic  . CAD in native artery; and the grafts 1992, 1997, 2002, 2006, 2012    CABG 1992 and redo CABG 2006  . CAD (coronary artery disease) of bypass graft 2006, 2012    In 2006: Occluded SVG-OM noted (SVG-diagonal was previously occluded); 2012: Severe lesion in redo SVG-OM1 --> BMS PCI   . CAD S/P percutaneous coronary angioplasty October 2012; Feb & Apr 2015    10/2010: Non-STEMI: PCI to SVG-OM1 Integrity BMS 3.5 mm x 15 mm (3.85 mm);; 02/2013: PCI to mid RCA Xience Apling DES 2.75 mm x 15 mm (3.1 mm) ;; 04/2013 : dRCA  Integrity Resolute DES   2.25 mm x 84mm (2.5 mm)  . History of nuclear stress test Sept 2014    EF 39% (down from) 45%, no ischemia; but now the inferobasal perfusion defect is determined to be likley prior infarction (as opposed to diaphragmatic attenuation)  . Echocardiogram findings abnormal, without diagnosis [id number]; Oct 2014    EF 50-55%, anteroseptal hypokinesis. Mild to moderate concentric LVH. Mild MR, aortic sclerosis. Mild atrial dilatation bilaterally.; Oct - mild LVH, EF ~50-55%, incoordinate septal WM, - with no other notable WMA  .  History of Non-ST elevated myocardial infarction (non-STEMI) 1992, 2006, 2012; 02/2013  . Peptic ulcer disease  2004  . BPH (benign prostatic hyperplasia)   . Chronic low back pain     Prior Cardiac Evaluation and Past Surgical History: Past Surgical History  Procedure Laterality Date  . Appendectomy    . Cholecystectomy  2009  . Eye surgery      bilateral cataracts   . Spinal fusion    . Av fistula repair Left     5/11  . Colon resection      transverse and proximal descending w/ primar anastomosis  . Doppler echocardiography  01/25/2012    EF 50 to 55%  . Nm myocar perf wall motion  10/12/2011    EF 54%,LV normal ; no signifiant ischemia  . Cardiac  catheterization  10/16/2010    patent  LIMA to the LAD , PATENT  svg TO 2nd marginals ,occluded PLA with collaterals and SVG to the OM  had 90% stenosis  was txlge  bare - metal  3.5  Integrity ten  postdilate  36-37  mm    . Cardiac catheterization  03/22/2004    loss of both SVG,severe disease in prox and ostial  circ not amenable to invention. mod diease distal LAD  AFTER BYPASS GRAFT;-CVTS to evaluate   . Coronary angioplasty  04/03/1995    OM and Circ  . Coronary angioplasty  02/01/2000    CIRC  . Carotid doppler  05/23/2012    ABN CAROTID-- RGT BULB/PROX ICA mild to mod 50-60%;lft bulb/prox mild to mod 0-49%;left subclavian abn waveforms consistent with patients lft arm A/V fistula  . Event monitor  01/23/2012-02/06/2012    SINUS ,LBBB,unifocal PVCs  . Coronary artery bypass graft  08/22/1990    INITIAL:LIMA to LAD, SVG to  Diagonal, SVG to OM  . Coronary artery bypass graft  2006    SVG to OM1,SVG to OM2 with patent LIMA  to LAD and occluded vein  graft to the diagonal  and occluded vein grft to OM from  prior  surgery  . Percutaneous coronary stent intervention (pci-s)  02/23/2013    mid RCA 80% & 70% - Xience 2.75 mm x 15 mm ( 3.34mm) ; LIMA-LAD patent, SVG-OM patent (stent patent); SVG-Diag patent.  . Transthoracic echocardiogram  02/23/2013    Moderate concentric hypertrophy. EF 4500%. Septal bounce. Grade 2 diastolic dysfunction (pseudo-normal - severely elevated filling pressures) mild to moderate MR and moderate LA dilation to  . Lower extremity arterial dopplers  October 2014    Calcified but non-occlusive peripheral arteries. No evidence of stenosis  . Percutaneous coronary stent intervention (pci-s)  April 2015    dRCA - Integrity Resolute DES   2.25 mm x 70mm (2.5 mm)    MEDICATIONS AND ALLERGIES REVIEWED IN EPIC No Change in Social and Family History  ROS: A comprehensive Review of Systems - Negative except Symptoms are in history of present illness. No recent  illnesses. He stopped taking fenofibrate because of cramping.   PHYSICAL EXAM BP 132/50  Pulse 61  Ht 5\' 5"  (1.651 m)  Wt 174 lb 3.2 oz (79.017 kg)  BMI 28.99 kg/m2 General appearance: alert, cooperative, appears stated age, no distress; and Borderline obese. Chronically ill-appearing, pale, well groomed.Marland Kitchen answers questions appropriately.  Neck: no adenopathy, no carotid bruit, mild JVD;  Supple, symmetrical, trachea midline  Lungs: clear to auscultation bilaterally, normal percussion bilaterally and Nonlabored, good air movement  Heart: normal apical impulse, RRR, S1,  S2 normal, S4 present, SEM 2/6, crescendo, decrescendo and harsh at 2nd right intercostal space, no click and no rub  Abdomen: soft, non-tender; bowel sounds normal; no masses, no organomegaly and Mild truncal obesity  Extremities: extremities normal, atraumatic, no cyanosis or edema and fistula in the left upper arm with good thrill  Pulses: 2+ and symmetric Neurologic: Grossly normal   Adult ECG Report Normal sinus rhythm, 61 beats per minute; widened QRS complexes L BBB and LAA.  Recent Labs most recent lipids were from February - showed LDL 32 which does not make sense.:    ASSESSMENT / PLAN: CAD S/P percutaneous coronary angioplasty Notably improved angina following his most recent PCI. I think that that the lesion was not as well-visualized when he presented in February. At this point I think he probably has angina from his existing disease with multivessel disease and did not have any more PCI amenable target based on last catheterization. Continue aspirin plus Plavix along with beta blocker, statin.   Angina, class II - stable At this point I think he is chronic and stable. It really seems that is probably related to increased LV wall SVG overload on predialysis days.  I think at this point we will increase his Imdur dose on his predialysis days to avoid any excess nitroglycerin. I do agree with still trying to  get up to a lower dry weight elevated but slowly. Continue current dose of beta blocker.  Chronic combined systolic and diastolic congestive heart failure, NYHA class 2 This is probably the cause of his angina as described. Is a good regimen he does have some systolic dysfunction but mostly is diastolic. We are really at the mercy of his renal replacement therapy.  HYPERTENSION Well-controlled on current management.  Diabetes mellitus with neuropathy Monitored by PCP. On insulin.  HYPERLIPIDEMIA His be on a statin. He has high triglycerides but cannot tolerate fenofibrate.  I'm not sure what to make of the left he had during his hospitalization and the LDL as low as it was. Will recheck CMP and lipids before next visit.    Orders Placed This Encounter  Procedures  . EKG 12-Lead   No orders of the defined types were placed in this encounter.    Followup: 3 months  DAVID W. Ellyn Hack, M.D., M.S. Interventional Cardiologist [facility]

## 2013-07-07 DIAGNOSIS — N186 End stage renal disease: Secondary | ICD-10-CM | POA: Diagnosis not present

## 2013-07-09 DIAGNOSIS — N039 Chronic nephritic syndrome with unspecified morphologic changes: Secondary | ICD-10-CM | POA: Diagnosis not present

## 2013-07-09 DIAGNOSIS — E1129 Type 2 diabetes mellitus with other diabetic kidney complication: Secondary | ICD-10-CM | POA: Diagnosis not present

## 2013-07-09 DIAGNOSIS — Z23 Encounter for immunization: Secondary | ICD-10-CM | POA: Diagnosis not present

## 2013-07-09 DIAGNOSIS — N2581 Secondary hyperparathyroidism of renal origin: Secondary | ICD-10-CM | POA: Diagnosis not present

## 2013-07-09 DIAGNOSIS — D631 Anemia in chronic kidney disease: Secondary | ICD-10-CM | POA: Diagnosis not present

## 2013-07-09 DIAGNOSIS — N186 End stage renal disease: Secondary | ICD-10-CM | POA: Diagnosis not present

## 2013-07-14 ENCOUNTER — Telehealth: Payer: Self-pay

## 2013-07-14 NOTE — Telephone Encounter (Signed)
Appointment confirmed. Pt was busy at the time and was unable to complete pre visit call.

## 2013-07-15 ENCOUNTER — Encounter: Payer: Self-pay | Admitting: Family Medicine

## 2013-07-15 ENCOUNTER — Ambulatory Visit (INDEPENDENT_AMBULATORY_CARE_PROVIDER_SITE_OTHER): Payer: Medicare Other | Admitting: Family Medicine

## 2013-07-15 VITALS — BP 112/78 | HR 60 | Temp 98.0°F | Resp 16 | Ht 65.0 in | Wt 167.0 lb

## 2013-07-15 DIAGNOSIS — N186 End stage renal disease: Secondary | ICD-10-CM | POA: Diagnosis not present

## 2013-07-15 DIAGNOSIS — E1140 Type 2 diabetes mellitus with diabetic neuropathy, unspecified: Secondary | ICD-10-CM

## 2013-07-15 DIAGNOSIS — E785 Hyperlipidemia, unspecified: Secondary | ICD-10-CM

## 2013-07-15 DIAGNOSIS — Z125 Encounter for screening for malignant neoplasm of prostate: Secondary | ICD-10-CM

## 2013-07-15 DIAGNOSIS — I209 Angina pectoris, unspecified: Secondary | ICD-10-CM

## 2013-07-15 DIAGNOSIS — E1149 Type 2 diabetes mellitus with other diabetic neurological complication: Secondary | ICD-10-CM | POA: Diagnosis not present

## 2013-07-15 DIAGNOSIS — Z0000 Encounter for general adult medical examination without abnormal findings: Secondary | ICD-10-CM | POA: Diagnosis not present

## 2013-07-15 DIAGNOSIS — I10 Essential (primary) hypertension: Secondary | ICD-10-CM

## 2013-07-15 DIAGNOSIS — E1142 Type 2 diabetes mellitus with diabetic polyneuropathy: Secondary | ICD-10-CM

## 2013-07-15 DIAGNOSIS — I2510 Atherosclerotic heart disease of native coronary artery without angina pectoris: Secondary | ICD-10-CM

## 2013-07-15 DIAGNOSIS — E039 Hypothyroidism, unspecified: Secondary | ICD-10-CM

## 2013-07-15 DIAGNOSIS — Z9861 Coronary angioplasty status: Secondary | ICD-10-CM

## 2013-07-15 LAB — PSA: PSA: 0.56 ng/mL (ref 0.10–4.00)

## 2013-07-15 LAB — CBC WITH DIFFERENTIAL/PLATELET
BASOS PCT: 0.3 % (ref 0.0–3.0)
Basophils Absolute: 0 10*3/uL (ref 0.0–0.1)
Eosinophils Absolute: 0.2 10*3/uL (ref 0.0–0.7)
Eosinophils Relative: 2.9 % (ref 0.0–5.0)
HEMATOCRIT: 33.5 % — AB (ref 39.0–52.0)
HEMOGLOBIN: 11.2 g/dL — AB (ref 13.0–17.0)
LYMPHS ABS: 0.9 10*3/uL (ref 0.7–4.0)
LYMPHS PCT: 12.1 % (ref 12.0–46.0)
MCHC: 33.4 g/dL (ref 30.0–36.0)
MCV: 95.7 fl (ref 78.0–100.0)
Monocytes Absolute: 0.7 10*3/uL (ref 0.1–1.0)
Monocytes Relative: 9.6 % (ref 3.0–12.0)
Neutro Abs: 5.3 10*3/uL (ref 1.4–7.7)
Neutrophils Relative %: 75.1 % (ref 43.0–77.0)
Platelets: 267 10*3/uL (ref 150.0–400.0)
RBC: 3.49 Mil/uL — AB (ref 4.22–5.81)
RDW: 14.8 % (ref 11.5–15.5)
WBC: 7.1 10*3/uL (ref 4.0–10.5)

## 2013-07-15 LAB — BASIC METABOLIC PANEL
BUN: 37 mg/dL — ABNORMAL HIGH (ref 6–23)
CALCIUM: 9.9 mg/dL (ref 8.4–10.5)
CO2: 31 mEq/L (ref 19–32)
Chloride: 93 mEq/L — ABNORMAL LOW (ref 96–112)
Creatinine, Ser: 4.8 mg/dL (ref 0.4–1.5)
GFR: 12.71 mL/min — AB (ref >60.00)
GLUCOSE: 124 mg/dL — AB (ref 70–99)
POTASSIUM: 4.3 meq/L (ref 3.5–5.1)
Sodium: 136 mEq/L (ref 135–145)

## 2013-07-15 LAB — LIPID PANEL
CHOLESTEROL: 88 mg/dL (ref 0–200)
HDL: 25.6 mg/dL — ABNORMAL LOW (ref >39.00)
LDL CALC: 5 mg/dL (ref 0–99)
NonHDL: 62.4
Total CHOL/HDL Ratio: 3
Triglycerides: 287 mg/dL — ABNORMAL HIGH (ref 0.0–149.0)
VLDL: 57.4 mg/dL — AB (ref 0.0–40.0)

## 2013-07-15 LAB — HEMOGLOBIN A1C: Hgb A1c MFr Bld: 5.6 % (ref 4.6–6.5)

## 2013-07-15 LAB — HEPATIC FUNCTION PANEL
ALT: 25 U/L (ref 0–53)
AST: 24 U/L (ref 0–37)
Albumin: 4.4 g/dL (ref 3.5–5.2)
Alkaline Phosphatase: 57 U/L (ref 39–117)
BILIRUBIN DIRECT: 0.1 mg/dL (ref 0.0–0.3)
TOTAL PROTEIN: 7.1 g/dL (ref 6.0–8.3)
Total Bilirubin: 0.7 mg/dL (ref 0.2–1.2)

## 2013-07-15 LAB — TSH: TSH: 0.77 u[IU]/mL (ref 0.35–4.50)

## 2013-07-15 MED ORDER — ISOSORBIDE MONONITRATE ER 60 MG PO TB24
60.0000 mg | ORAL_TABLET | Freq: Two times a day (BID) | ORAL | Status: DC
Start: 2013-07-15 — End: 2013-10-15

## 2013-07-15 NOTE — Assessment & Plan Note (Signed)
Pt's PE WNL.  UTD on colonoscopy.  Check labs.  Anticipatory guidance provided.

## 2013-07-15 NOTE — Patient Instructions (Signed)
Follow up in 3-4 months to recheck diabetes We'll notify you of your lab results and make any changes if needed Contact cardiology and see if there's a brand name fenofibrate that you could qualify for prescription assistance Call with any questions or concerns Have a great summer!!!

## 2013-07-15 NOTE — Progress Notes (Signed)
Subjective:    Patient ID: [name] [name], male    DOB: 11/02/1940, 73 y.o.   MRN: [id number]  HPI Here today for CPE.  Risk Factors: HTN- chronic problem, well controlled on Coreg, terazosin, imdur.  CP has improved (seeing cards regularly).  No SOB, HAs, visual changes, edema. Hyperlipidemia- chronic problem, on Lipitor.  Cannot afford Fenofibrate.  Denies abd pain, N/V. DM- chronic problem, on NPH- pt is self titrating based on CBGs.  Is requiring less insulin than before.  CBGs running 80-150.  UTD on podiatry appt (Feb) and eye exam (last exam May, has upcoming appt on 7/10).  Denies symptomatic lows. Hypothyroid- chronic problem, on Levothyroxine.  + fatigue but pt feels this is coming from HD.  Denies dry skin/nails, constipation. Physical Activity: limited Fall Risk: low risk Depression: denies current symptoms Hearing: normal to conversational tones and whispered voice at 6 ft. ADL's: independent Cognitive: normal linear thought process, memory and attention intact Home Safety: safe at home, lives w/ wife Height, Weight, BMI, Visual Acuity: see vitals, vision corrected to 20/20 w/ glasses Counseling: UTD on colonoscopy, eye exam. Labs Ordered: See A&P Care Plan: See A&P    Review of Systems Patient reports no vision/hearing changes, anorexia, fever ,adenopathy, persistant/recurrent hoarseness, swallowing issues, chest pain, palpitations, edema, persistant/recurrent cough, hemoptysis, dyspnea (rest,exertional, paroxysmal nocturnal), gastrointestinal  bleeding (melena, rectal bleeding), abdominal pain, excessive heart burn, GU symptoms (dysuria, hematuria, voiding/incontinence issues) syncope, focal weakness, memory loss, numbness & tingling, skin/hair/nail changes, depression, anxiety, abnormal bruising/bleeding, musculoskeletal symptoms/signs.     Objective:   Physical Exam BP 112/78  Pulse 60  Temp(Src) 98 F (36.7 C) (Oral)  Resp 16  Ht 5\' 5"  (1.651 m)  Wt 167 lb  (75.751 kg)  BMI 27.79 kg/m2  SpO2 97%  General Appearance:    Alert, cooperative, no distress, appears stated age  Head:    Normocephalic, without obvious abnormality, atraumatic  Eyes:    PERRL, conjunctiva/corneas clear, EOM's intact, fundi    benign, both eyes       Ears:    Normal TM's and external ear canals, both ears  Nose:   Nares normal, septum midline, mucosa normal, no drainage   or sinus tenderness  Throat:   Lips, mucosa, and tongue normal; teeth and gums normal  Neck:   Supple, symmetrical, trachea midline, no adenopathy;       thyroid:  No enlargement/tenderness/nodules  Back:     Symmetric, no curvature, ROM normal, no CVA tenderness  Lungs:     Clear to auscultation bilaterally, respirations unlabored  Chest wall:    No tenderness or deformity  Heart:    Regular rate and rhythm, S1 and S2 normal, + murmur, no   rub or gallop  Abdomen:     Soft, non-tender, bowel sounds active all four quadrants,    no masses, no organomegaly  Genitalia:    Normal male without lesion, discharge or tenderness  Rectal:    Normal tone, normal prostate, no masses or tenderness  Extremities:   Extremities normal, atraumatic, no cyanosis or edema, venous fistula in LUE w/ palpable thrill  Pulses:   2+ and symmetric all extremities  Skin:   Skin color, texture, turgor normal, no rashes or lesions  Lymph nodes:   Cervical, supraclavicular, and axillary nodes normal  Neurologic:   CNII-XII intact. Normal strength, sensation and reflexes      throughout          Assessment & Plan:

## 2013-07-15 NOTE — Progress Notes (Signed)
Pre visit review using our clinic review tool, if applicable. No additional management support is needed unless otherwise documented below in the visit note. 

## 2013-07-15 NOTE — Assessment & Plan Note (Signed)
Chronic problem.  Following w/ cards.  Continues to have angina and was told there is nothing to be done.  Will continue to follow and assist as able.

## 2013-07-15 NOTE — Assessment & Plan Note (Signed)
Pt is stable on HD but this is taking an emotional and physical toll.  Pt no longer wants to go but recognizes he doesn't have any other options at this time.  Will follow.

## 2013-07-15 NOTE — Assessment & Plan Note (Signed)
Chronic problem.  Currently asymptomatic.  Check labs.  Adjust meds prn  

## 2013-07-15 NOTE — Assessment & Plan Note (Signed)
Chronic problem.  Pt tolerating statin.  Unable to afford fenofibrate.  Check labs.  Encouraged him to discuss the fenofibrate w/ cards as they may have another option.  Will follow.

## 2013-07-15 NOTE — Assessment & Plan Note (Signed)
Chronic problem.  BP adequately controlled.  Check labs.  No anticipated med changes.

## 2013-07-15 NOTE — Assessment & Plan Note (Signed)
Refill provided on Imdur

## 2013-07-15 NOTE — Assessment & Plan Note (Signed)
Chronic problem.  Pt's CBGs are well controlled.  Due to continued weight loss and decreased appetite, pt is requiring very little if any insulin on a daily basis.  He is self adjusting dose based on CBGs.  UTD on eye exam.  Check labs.  Will follow.

## 2013-07-17 ENCOUNTER — Encounter (INDEPENDENT_AMBULATORY_CARE_PROVIDER_SITE_OTHER): Payer: Medicare Other | Admitting: Ophthalmology

## 2013-07-17 DIAGNOSIS — E11311 Type 2 diabetes mellitus with unspecified diabetic retinopathy with macular edema: Secondary | ICD-10-CM | POA: Diagnosis not present

## 2013-07-17 DIAGNOSIS — H35039 Hypertensive retinopathy, unspecified eye: Secondary | ICD-10-CM

## 2013-07-17 DIAGNOSIS — E1165 Type 2 diabetes mellitus with hyperglycemia: Secondary | ICD-10-CM

## 2013-07-17 DIAGNOSIS — I10 Essential (primary) hypertension: Secondary | ICD-10-CM

## 2013-07-17 DIAGNOSIS — H43819 Vitreous degeneration, unspecified eye: Secondary | ICD-10-CM

## 2013-07-17 DIAGNOSIS — E11319 Type 2 diabetes mellitus with unspecified diabetic retinopathy without macular edema: Secondary | ICD-10-CM

## 2013-07-17 DIAGNOSIS — E1139 Type 2 diabetes mellitus with other diabetic ophthalmic complication: Secondary | ICD-10-CM

## 2013-07-30 DIAGNOSIS — E1129 Type 2 diabetes mellitus with other diabetic kidney complication: Secondary | ICD-10-CM | POA: Diagnosis not present

## 2013-08-07 DIAGNOSIS — N186 End stage renal disease: Secondary | ICD-10-CM | POA: Diagnosis not present

## 2013-08-08 DIAGNOSIS — Z23 Encounter for immunization: Secondary | ICD-10-CM | POA: Diagnosis not present

## 2013-08-08 DIAGNOSIS — E1129 Type 2 diabetes mellitus with other diabetic kidney complication: Secondary | ICD-10-CM | POA: Diagnosis not present

## 2013-08-08 DIAGNOSIS — N186 End stage renal disease: Secondary | ICD-10-CM | POA: Diagnosis not present

## 2013-08-08 DIAGNOSIS — D631 Anemia in chronic kidney disease: Secondary | ICD-10-CM | POA: Diagnosis not present

## 2013-08-08 DIAGNOSIS — N2581 Secondary hyperparathyroidism of renal origin: Secondary | ICD-10-CM | POA: Diagnosis not present

## 2013-08-10 ENCOUNTER — Other Ambulatory Visit: Payer: Self-pay | Admitting: Family Medicine

## 2013-08-10 NOTE — Telephone Encounter (Signed)
Med filled.  

## 2013-08-17 ENCOUNTER — Other Ambulatory Visit: Payer: Self-pay | Admitting: Cardiology

## 2013-08-18 NOTE — Telephone Encounter (Signed)
Rx was sent to pharmacy electronically. 

## 2013-09-07 DIAGNOSIS — N186 End stage renal disease: Secondary | ICD-10-CM | POA: Diagnosis not present

## 2013-09-08 DIAGNOSIS — E1129 Type 2 diabetes mellitus with other diabetic kidney complication: Secondary | ICD-10-CM | POA: Diagnosis not present

## 2013-09-08 DIAGNOSIS — N186 End stage renal disease: Secondary | ICD-10-CM | POA: Diagnosis not present

## 2013-09-08 DIAGNOSIS — Z23 Encounter for immunization: Secondary | ICD-10-CM | POA: Diagnosis not present

## 2013-09-08 DIAGNOSIS — N2581 Secondary hyperparathyroidism of renal origin: Secondary | ICD-10-CM | POA: Diagnosis not present

## 2013-09-12 DIAGNOSIS — N186 End stage renal disease: Secondary | ICD-10-CM | POA: Diagnosis not present

## 2013-09-12 DIAGNOSIS — N2581 Secondary hyperparathyroidism of renal origin: Secondary | ICD-10-CM | POA: Diagnosis not present

## 2013-09-12 DIAGNOSIS — E119 Type 2 diabetes mellitus without complications: Secondary | ICD-10-CM | POA: Diagnosis not present

## 2013-09-15 DIAGNOSIS — N186 End stage renal disease: Secondary | ICD-10-CM | POA: Diagnosis not present

## 2013-09-15 DIAGNOSIS — N2581 Secondary hyperparathyroidism of renal origin: Secondary | ICD-10-CM | POA: Diagnosis not present

## 2013-09-15 DIAGNOSIS — E119 Type 2 diabetes mellitus without complications: Secondary | ICD-10-CM | POA: Diagnosis not present

## 2013-09-16 ENCOUNTER — Other Ambulatory Visit: Payer: Self-pay | Admitting: Family Medicine

## 2013-09-16 NOTE — Telephone Encounter (Signed)
Med filled.  

## 2013-09-18 ENCOUNTER — Encounter (INDEPENDENT_AMBULATORY_CARE_PROVIDER_SITE_OTHER): Payer: Medicare Other | Admitting: Ophthalmology

## 2013-09-18 DIAGNOSIS — E11311 Type 2 diabetes mellitus with unspecified diabetic retinopathy with macular edema: Secondary | ICD-10-CM | POA: Diagnosis not present

## 2013-09-18 DIAGNOSIS — I10 Essential (primary) hypertension: Secondary | ICD-10-CM | POA: Diagnosis not present

## 2013-09-18 DIAGNOSIS — E11319 Type 2 diabetes mellitus with unspecified diabetic retinopathy without macular edema: Secondary | ICD-10-CM | POA: Diagnosis not present

## 2013-09-18 DIAGNOSIS — E1165 Type 2 diabetes mellitus with hyperglycemia: Secondary | ICD-10-CM | POA: Diagnosis not present

## 2013-09-18 DIAGNOSIS — E1139 Type 2 diabetes mellitus with other diabetic ophthalmic complication: Secondary | ICD-10-CM | POA: Diagnosis not present

## 2013-09-18 DIAGNOSIS — H35039 Hypertensive retinopathy, unspecified eye: Secondary | ICD-10-CM

## 2013-09-18 DIAGNOSIS — H43819 Vitreous degeneration, unspecified eye: Secondary | ICD-10-CM

## 2013-09-23 ENCOUNTER — Other Ambulatory Visit: Payer: Self-pay | Admitting: Family Medicine

## 2013-09-23 NOTE — Telephone Encounter (Signed)
Med filled.  

## 2013-09-25 ENCOUNTER — Encounter: Payer: Medicare Other | Admitting: Family Medicine

## 2013-09-28 ENCOUNTER — Encounter: Payer: Self-pay | Admitting: Cardiology

## 2013-09-28 ENCOUNTER — Ambulatory Visit (INDEPENDENT_AMBULATORY_CARE_PROVIDER_SITE_OTHER): Payer: Medicare Other | Admitting: Cardiology

## 2013-09-28 VITALS — BP 120/70 | HR 57 | Ht 65.0 in | Wt 172.2 lb

## 2013-09-28 DIAGNOSIS — N189 Chronic kidney disease, unspecified: Secondary | ICD-10-CM

## 2013-09-28 DIAGNOSIS — E1149 Type 2 diabetes mellitus with other diabetic neurological complication: Secondary | ICD-10-CM

## 2013-09-28 DIAGNOSIS — I25810 Atherosclerosis of coronary artery bypass graft(s) without angina pectoris: Secondary | ICD-10-CM | POA: Diagnosis not present

## 2013-09-28 DIAGNOSIS — I25709 Atherosclerosis of coronary artery bypass graft(s), unspecified, with unspecified angina pectoris: Secondary | ICD-10-CM

## 2013-09-28 DIAGNOSIS — I209 Angina pectoris, unspecified: Secondary | ICD-10-CM

## 2013-09-28 DIAGNOSIS — Z9861 Coronary angioplasty status: Secondary | ICD-10-CM

## 2013-09-28 DIAGNOSIS — K449 Diaphragmatic hernia without obstruction or gangrene: Secondary | ICD-10-CM

## 2013-09-28 DIAGNOSIS — I10 Essential (primary) hypertension: Secondary | ICD-10-CM

## 2013-09-28 DIAGNOSIS — I200 Unstable angina: Secondary | ICD-10-CM

## 2013-09-28 DIAGNOSIS — E1140 Type 2 diabetes mellitus with diabetic neuropathy, unspecified: Secondary | ICD-10-CM

## 2013-09-28 DIAGNOSIS — N186 End stage renal disease: Secondary | ICD-10-CM

## 2013-09-28 DIAGNOSIS — I2510 Atherosclerotic heart disease of native coronary artery without angina pectoris: Secondary | ICD-10-CM

## 2013-09-28 DIAGNOSIS — E1142 Type 2 diabetes mellitus with diabetic polyneuropathy: Secondary | ICD-10-CM

## 2013-09-28 DIAGNOSIS — E785 Hyperlipidemia, unspecified: Secondary | ICD-10-CM

## 2013-09-28 DIAGNOSIS — R0601 Orthopnea: Secondary | ICD-10-CM

## 2013-09-28 MED ORDER — NITROGLYCERIN 0.4 MG SL SUBL: 0.4000 mg | SUBLINGUAL_TABLET | SUBLINGUAL | Status: DC | PRN

## 2013-09-28 NOTE — Patient Instructions (Signed)
Your physician wants you to follow-up in: 4-6 months with Dr Ellyn Hack.  You will receive a reminder letter in the mail two months in advance. If you don't receive a letter, please call our office to schedule the follow-up appointment.

## 2013-09-28 NOTE — Assessment & Plan Note (Signed)
Blood pressure was pretty good today. We will keep her shoe horn, because he has had orthostatic hypotension in the past. His pre-and post dialysis days are always difficult.

## 2013-09-28 NOTE — Assessment & Plan Note (Signed)
He has graft disease as noted above. No plans for checking a stress test anytime soon unless symptoms change.

## 2013-09-28 NOTE — Progress Notes (Signed)
PCP: Annye Asa, MD  Clinic Note: Chief Complaint  Patient presents with  . ROV 3 months    C/o chest pain-daily and takes ~1-3 NTG, occas shortness of breath and lightheadedness.    HPI: [name] [name] is a 73 y.o. male with a PMH below who presents today for a three-month followup of his chronic stable angina with known CAD status post PCI on multiple occasions after redo CABG. I last saw him in June 2015, and he was doing relatively well without any major complaints.  He still had chronic intermittent angina that sounded like it was more in a predialysis day. However on review he currently thinks it really hasn't made much difference. He tried taking an additional 30 mg of Imdur during the day as a second dose but did not notice any significant difference in symptoms. The episodes are usually always relieved with one sublingual nitroglycerin.  Past Medical History  Diagnosis Date  . Diabetes mellitus   . Hyperlipidemia   . Hypertension   . Gout   . Hypothyroidism     On supplementation  . LBBB (left bundle branch block)     Chronic  . History of colon cancer 2003    colectomy for CA. no recurrence . never required  radiation or chem  . Anemia     Likely secondary to history of GI bleed  . Stroke 1998  . End stage renal disease on dialysis     Dialyzes at [facility]: Tues/Th/Sat - left upper cavity AV fistula brachiocephalic  . CAD in native artery; and the grafts 1992, 1997, 2002, 2006, 2012    CABG 1992 and redo CABG 2006  . CAD (coronary artery disease) of bypass graft 2006, 2012    In 2006: Occluded SVG-OM noted (SVG-diagonal was previously occluded); 2012: Severe lesion in redo SVG-OM1 --> BMS PCI   . CAD S/P percutaneous coronary angioplasty October 2012; Feb & Apr 2015    10/2010: Non-STEMI: PCI to SVG-OM1 Integrity BMS 3.5 mm x 15 mm (3.85 mm);; 02/2013: PCI to mid RCA Xience Apling DES 2.75 mm x 15 mm (3.1 mm) ;; 04/2013 : dRCA  Integrity Resolute DES   2.25  mm x 62mm (2.5 mm)  . History of nuclear stress test Sept 2014    EF 39% (down from) 45%, no ischemia; but now the inferobasal perfusion defect is determined to be likley prior infarction (as opposed to diaphragmatic attenuation)  . Echocardiogram findings abnormal, without diagnosis [id number]; Oct 2014    EF 50-55%, anteroseptal hypokinesis. Mild to moderate concentric LVH. Mild MR, aortic sclerosis. Mild atrial dilatation bilaterally.; Oct - mild LVH, EF ~50-55%, incoordinate septal WM, - with no other notable WMA  . History of Non-ST elevated myocardial infarction (non-STEMI) 1992, 2006, 2012; 02/2013  . Peptic ulcer disease  2004  . BPH (benign prostatic hyperplasia)   . Chronic low back pain     Prior Cardiac Evaluation and Past Surgical History: Past Surgical History  Procedure Laterality Date  . Appendectomy    . Cholecystectomy  2009  . Eye surgery      bilateral cataracts   . Spinal fusion    . Av fistula repair Left     5/11  . Colon resection      transverse and proximal descending w/ primar anastomosis  . Doppler echocardiography  01/25/2012    EF 50 to 55%  . Nm myocar perf wall motion  10/12/2011    EF 54%,LV normal ;  no signifiant ischemia  . Cardiac catheterization  10/16/2010    patent  LIMA to the LAD , PATENT  svg TO 2nd marginals ,occluded PLA with collaterals and SVG to the OM  had 90% stenosis  was txlge  bare - metal  3.5  Integrity ten  postdilate  36-37  mm    . Cardiac catheterization  03/22/2004    loss of both SVG,severe disease in prox and ostial  circ not amenable to invention. mod diease distal LAD  AFTER BYPASS GRAFT;-CVTS to evaluate   . Coronary angioplasty  04/03/1995    OM and Circ  . Coronary angioplasty  02/01/2000    [facility]  . Carotid doppler  05/23/2012    ABN CAROTID-- RGT BULB/PROX ICA mild to mod 50-60%;lft bulb/prox mild to mod 0-49%;left subclavian abn waveforms consistent with patients lft arm A/V fistula  . Event monitor   01/23/2012-02/06/2012    SINUS ,LBBB,unifocal PVCs  . Coronary artery bypass graft  08/22/1990    INITIAL:LIMA to LAD, SVG to  Diagonal, SVG to OM  . Coronary artery bypass graft  2006    SVG to OM1,SVG to OM2 with patent LIMA  to LAD and occluded vein  graft to the diagonal  and occluded vein grft to OM from  prior  surgery  . Percutaneous coronary stent intervention (pci-s)  02/23/2013    mid RCA 80% & 70% - Xience 2.75 mm x 15 mm ( 3.34mm) ; LIMA-LAD patent, SVG-OM patent (stent patent); SVG-Diag patent.  . Transthoracic echocardiogram  02/23/2013    Moderate concentric hypertrophy. EF 4500%. Septal bounce. Grade 2 diastolic dysfunction (pseudo-normal - severely elevated filling pressures) mild to moderate MR and moderate LA dilation to  . Lower extremity arterial dopplers  October 2014    Calcified but non-occlusive peripheral arteries. No evidence of stenosis  . Percutaneous coronary stent intervention (pci-s)  April 2015    dRCA - Integrity Resolute DES   2.25 mm x 86mm (2.5 mm)    Interval History:  [name] [name] returns today we leave without any major differences in his presentation symptoms from before. He does not have any symptoms of PND orthopnea minimal if any edema. He does have almost every day in various times and at least one episode of chest discomfort but also for muscle take nitroglycerin. He may need to take nitroglycerin once every couple days. These episodes can happen in the morning or the evening of her off dialysis days. Not necessarily associated as much with exertion however. He is not that active endoscopic brushings of according to his description. He doesn't have angina during dialysis. He has had some orthostatic hypotension episode following dialysis. No true syncope or near syncope. No claudication. He initially said that he gets in his angina type pain more often in the afternoon after eating a meal. However he can also happen sometimes at night or in the morning. Usually he is  not exacerbated by exertion. Partly because he does not exert himself much. He also denies exertional dyspnea.  No PND, orthopnea or edema. No palpitations, weakness or syncope/near syncope. No TIA/amaurosis fugax symptoms. No melena, hematochezia, hematuria, or epstaxis. No claudication.  ROS: A comprehensive Review of Systems - was performed Review of Systems  Constitutional: Negative.  Negative for fever and chills.  HENT: Negative for nosebleeds.   Respiratory: Negative for cough, hemoptysis, sputum production, shortness of breath and wheezing.   Cardiovascular: Positive for chest pain.       Per history of  present illness  Gastrointestinal: Negative for blood in stool and melena.  Genitourinary: Negative for hematuria.  Musculoskeletal: Negative for back pain.  Neurological: Negative for dizziness, tingling, tremors, sensory change, speech change, focal weakness, seizures, loss of consciousness and headaches.  Endo/Heme/Allergies: Does not bruise/bleed easily.  Psychiatric/Behavioral: Negative for depression and hallucinations. The patient is not nervous/anxious.   All other systems reviewed and are negative.  ALLERGIES REVIEWED IN EPIC -- no change  Current Outpatient Prescriptions on File Prior to Visit  Medication Sig Dispense Refill  . allopurinol (ZYLOPRIM) 100 MG tablet TAKE ONE TABLET BY MOUTH ONCE DAILY  90 tablet  1  . aspirin 81 MG chewable tablet Chew 81 mg by mouth at bedtime.      Marland Kitchen atorvastatin (LIPITOR) 40 MG tablet Take 40 mg by mouth at bedtime.      Marland Kitchen atorvastatin (LIPITOR) 40 MG tablet TAKE ONE TABLET BY MOUTH ONCE DAILY  90 tablet  2  . Besifloxacin HCl (BESIVANCE) 0.6 % SUSP Place 1 drop into both eyes See admin instructions. Use eye drops 4 times daily for 2 days following injection by Dr. Zigmund Daniel (once a month)      . carvedilol (COREG) 25 MG tablet TAKE ONE TABLET BY MOUTH TWICE DAILY WITH MEALS  180 tablet  1  . clopidogrel (PLAVIX) 75 MG tablet TAKE ONE  TABLET BY MOUTH ONCE DAILY  90 tablet  1  . glucose blood (PRODIGY NO CODING BLOOD GLUC) test strip Test as directed  100 each  6  . insulin NPH Human (NOVOLIN N) 100 UNIT/ML injection Inject 10-20 Units into the skin 2 (two) times daily before a meal. Per sliding scale      . isosorbide mononitrate (IMDUR) 60 MG 24 hr tablet Take 1 tablet (60 mg total) by mouth 2 (two) times daily.  180 tablet  3  . levothyroxine (SYNTHROID, LEVOTHROID) 125 MCG tablet TAKE ONE TABLET BY MOUTH ONCE DAILY  90 tablet  0  . lidocaine-prilocaine (EMLA) cream Apply 1 application topically as needed (prior to dialysis treatments).      . loperamide (IMODIUM) 2 MG capsule Take 2 mg by mouth 4 (four) times daily as needed for diarrhea or loose stools.       . multivitamin (RENA-VIT) TABS tablet Take 1 tablet by mouth daily.  90 tablet  3  . sevelamer carbonate (RENVELA) 800 MG tablet Take 800 mg by mouth 3 (three) times daily with meals.      . temazepam (RESTORIL) 15 MG capsule Take 1 capsule (15 mg total) by mouth at bedtime as needed for sleep.  30 capsule  3  . terazosin (HYTRIN) 2 MG capsule TAKE ONE CAPSULE BY MOUTH ONCE DAILY AT BEDTIME  90 capsule  0   No current facility-administered medications on file prior to visit.    SOCIAL AND FAMILY HISTORY REVIEWED IN EPIC -- no change  Wt Readings from Last 3 Encounters:  09/28/13 172 lb 3.2 oz (78.109 kg)  07/15/13 167 lb (75.751 kg)  06/22/13 174 lb 3.2 oz (79.017 kg)    PHYSICAL EXAM BP 120/70  Pulse 57  Ht 5\' 5"  (1.651 m)  Wt 172 lb 3.2 oz (78.109 kg)  BMI 28.66 kg/m2 General appearance: alert, cooperative, appears stated age, no distress; and Borderline obese. Chronically ill-appearing, pale, well groomed.Marland Kitchen answers questions appropriately.  Neck: no adenopathy, no carotid bruit, mild JVD; Supple, symmetrical, trachea midline  Lungs: clear to auscultation bilaterally, normal percussion bilaterally and Nonlabored, good air  movement  Heart: normal apical  impulse, RRR, S1, S2 normal, S4 present, SEM 2/6, crescendo, decrescendo and harsh at 2nd right intercostal space, no click and no rub  Abdomen: soft, non-tender; bowel sounds normal; no masses, no organomegaly and Mild truncal obesity  Extremities: extremities normal, atraumatic, no cyanosis or edema and fistula in the left upper arm with good thrill  Pulses: 2+ and symmetric  Neurologic: Grossly normal   Adult ECG Report  Rate:  57 ;  Rhythm: sinus bradycardia and Left bundle-branch block. Otherwise stable  Narrative Interpretation:  Stable EKG  Recent Labs:  07/15/2013   TC 88, TG 287, HDL 26, LDL 5  ASSESSMENT / PLAN: Angina, class II - stable His anginal symptoms are pretty much stable. He seems quite happy with nitroglycerin responsive chest discomfort maybe once a day about once every other today. He is not overly extended and the constant long-acting nitrate increase of this particular point, since he did not really notice much improvement of the problem. It makes her more dizzy more headache. He does not feel or any more invasive evaluation. Plan: Continue current dose of beta blocker, nitrate as well as aspirin plus Plavix.    CAD S/P percutaneous coronary angioplasty Stable with recent PCI. He is currently on dual antiplatelet therapy. If there are issues with bleeding or bruising, we can stop aspirin. He is also on a stable dose of beta blocker and statin.  CAD (coronary artery disease) of bypass graft - PCI to SVG-OM with BMS; PCI-mid RCA with a Xience DES He has graft disease as noted above. No plans for checking a stress test anytime soon unless symptoms change.  Hyperlipidemia with target LDL less than 70 Chronic issue. On statin. Last labs were relatively well controlled except for triglycerides. We may need to consider adding a fibrate or prescription dose of omega-3 fatty acid as well Lovaza  Orthopnea Not as much of an issue following his recent PCI, and adjusted dry  weights on dialysis  Essential hypertension Blood pressure was pretty good today. We will keep her shoe horn, because he has had orthostatic hypotension in the past. His pre-and post dialysis days are always difficult.    Orders Placed This Encounter  Procedures  . EKG 12-Lead   Meds ordered this encounter  Medications  . nitroGLYCERIN (NITROSTAT) 0.4 MG SL tablet    Sig: Place 1 tablet (0.4 mg total) under the tongue every 5 (five) minutes as needed for chest pain.    Dispense:  100 tablet    Refill:  4     Followup:  4-6 months   Jasira Robinson W, M.D., M.S. Interventional Cardiologist   Pager # [phone]

## 2013-09-28 NOTE — Assessment & Plan Note (Signed)
Not as much of an issue following his recent PCI, and adjusted dry weights on dialysis

## 2013-09-28 NOTE — Assessment & Plan Note (Signed)
Stable with recent PCI. He is currently on dual antiplatelet therapy. If there are issues with bleeding or bruising, we can stop aspirin. He is also on a stable dose of beta blocker and statin.

## 2013-09-28 NOTE — Assessment & Plan Note (Signed)
His anginal symptoms are pretty much stable. He seems quite happy with nitroglycerin responsive chest discomfort maybe once a day about once every other today. He is not overly extended and the constant long-acting nitrate increase of this particular point, since he did not really notice much improvement of the problem. It makes her more dizzy more headache. He does not feel or any more invasive evaluation. Plan: Continue current dose of beta blocker, nitrate as well as aspirin plus Plavix.

## 2013-09-28 NOTE — Assessment & Plan Note (Signed)
Chronic issue. On statin. Last labs were relatively well controlled except for triglycerides. We may need to consider adding a fibrate or prescription dose of omega-3 fatty acid as well Lovaza

## 2013-10-07 DIAGNOSIS — N186 End stage renal disease: Secondary | ICD-10-CM | POA: Diagnosis not present

## 2013-10-08 DIAGNOSIS — N2581 Secondary hyperparathyroidism of renal origin: Secondary | ICD-10-CM | POA: Diagnosis not present

## 2013-10-08 DIAGNOSIS — N186 End stage renal disease: Secondary | ICD-10-CM | POA: Diagnosis not present

## 2013-10-08 DIAGNOSIS — D631 Anemia in chronic kidney disease: Secondary | ICD-10-CM | POA: Diagnosis not present

## 2013-10-08 DIAGNOSIS — Z23 Encounter for immunization: Secondary | ICD-10-CM | POA: Diagnosis not present

## 2013-10-13 ENCOUNTER — Encounter: Payer: Self-pay | Admitting: Cardiology

## 2013-10-13 ENCOUNTER — Telehealth: Payer: Self-pay | Admitting: *Deleted

## 2013-10-13 DIAGNOSIS — I25709 Atherosclerosis of coronary artery bypass graft(s), unspecified, with unspecified angina pectoris: Secondary | ICD-10-CM

## 2013-10-13 DIAGNOSIS — I214 Non-ST elevation (NSTEMI) myocardial infarction: Secondary | ICD-10-CM | POA: Insufficient documentation

## 2013-10-13 DIAGNOSIS — I200 Unstable angina: Secondary | ICD-10-CM

## 2013-10-13 NOTE — Progress Notes (Signed)
   Patient Name: [name] [name]  MRN: [id number] DOB: 1940/12/25  I saw [name] on 09/28/2013: This is an addendum to that visit.  Post contact him by Dr.Colodonato from Nephrology who was concerned that [name] is having progressively worsening anginal symptoms. Over the last week he started to have more more resting anginal symptoms which are more persistent requiring more nitroglycerin sublingual. With each episode he is becoming diaphoretic and dyspneic.  Based on his known history, I don't feel like there is any need to do a noninvasive evaluations. Based on the clinical recommendation from his nephrologist, the signs and symptoms are very concerning for crescendo angina above and beyond his baseline stable angina. He last underwent PCI in April but PCI to distal RCA. At this point I think the best course of action would be to return to the cardiac catheterization lab for invasive evaluation plus or minus PCI.  The patient will be contacted and will be scheduled for cardiac catheterization on Thursday, October 7. Plan will be for him to undergo dialysis on October 6 as he tends to have more angina on his predialysis days.  The patient is well familiar with the procedure as well as risks, benefits, alternatives and indications.  Patient Active Problem List   Diagnosis Date Noted  . Unstable angina - crescendo class 3-4 angina now at rest 10/13/2013    Priority: High  . History of Non-ST elevated myocardial infarction (non-STEMI)     Priority: High  . CAD (coronary artery disease) of bypass graft - PCI to SVG-OM with BMS; PCI-mid RCA with a Xience DES 09/29/2012    Priority: High  . End stage renal disease - on hemodialysis 05/01/2011    Priority: High  . CAD S/P percutaneous coronary angioplasty 10/09/2010    Priority: High  . Diabetes mellitus with neuropathy 12/08/2007    Priority: High  . Chronic combined systolic and diastolic congestive heart failure, NYHA class 2 02/15/2009      Priority: Medium  . Hyperlipidemia with target LDL less than 70 12/08/2007    Priority: Medium  . Essential hypertension 12/08/2007    Priority: Medium  . Presence of drug coated stent in right coronary artery 04/30/2013  . Personal history of other diseases of digestive disease 08/03/2009  . CEREBROVASCULAR ACCIDENT, HX OF 12/08/2007   PLAN: [facility] WITH GRAFT +/- PCI   Leonie Man, M.D., M.S. Interventional Cardiologist   Pager # [phone]

## 2013-10-13 NOTE — Telephone Encounter (Signed)
PATIENT IS AWARE ABOUT CATH

## 2013-10-13 NOTE — Telephone Encounter (Signed)
DR HARDING CALLED  RECEIVED CALL FROM NEPHROLOGIST TODAY.  PATIENT IS HAVING CHEST PAIN DURING DIALYSIS. DIALYSIS WAS STOPPED.  PER DR HARDING, PLACE PATIENT ON Thursday -CATH SCHEDULE FOR RESTING ANGINA   ORDERS DONE- LABS WILL BE DONE AT HOSPITAL , PATIENT WILL BE NOTIFIED

## 2013-10-14 ENCOUNTER — Other Ambulatory Visit: Payer: Self-pay | Admitting: *Deleted

## 2013-10-14 DIAGNOSIS — Z01818 Encounter for other preprocedural examination: Secondary | ICD-10-CM

## 2013-10-15 ENCOUNTER — Ambulatory Visit (HOSPITAL_COMMUNITY)
Admission: RE | Admit: 2013-10-15 | Discharge: 2013-10-16 | Disposition: A | Discharge status: Home / Self Care | Payer: Medicare Other | Source: Ambulatory Visit | Attending: Cardiology | Admitting: Cardiology

## 2013-10-15 ENCOUNTER — Encounter (HOSPITAL_COMMUNITY)
Admission: RE | Disposition: A | Discharge status: Home / Self Care | Payer: Self-pay | Source: Ambulatory Visit | Attending: Cardiology

## 2013-10-15 ENCOUNTER — Encounter (HOSPITAL_COMMUNITY): Payer: Self-pay | Admitting: *Deleted

## 2013-10-15 DIAGNOSIS — Y831 Surgical operation with implant of artificial internal device as the cause of abnormal reaction of the patient, or of later complication, without mention of misadventure at the time of the procedure: Secondary | ICD-10-CM | POA: Diagnosis not present

## 2013-10-15 DIAGNOSIS — Z7982 Long term (current) use of aspirin: Secondary | ICD-10-CM | POA: Diagnosis not present

## 2013-10-15 DIAGNOSIS — I25700 Atherosclerosis of coronary artery bypass graft(s), unspecified, with unstable angina pectoris: Secondary | ICD-10-CM

## 2013-10-15 DIAGNOSIS — E1140 Type 2 diabetes mellitus with diabetic neuropathy, unspecified: Secondary | ICD-10-CM | POA: Diagnosis not present

## 2013-10-15 DIAGNOSIS — Z992 Dependence on renal dialysis: Secondary | ICD-10-CM | POA: Insufficient documentation

## 2013-10-15 DIAGNOSIS — N186 End stage renal disease: Secondary | ICD-10-CM

## 2013-10-15 DIAGNOSIS — I252 Old myocardial infarction: Secondary | ICD-10-CM | POA: Diagnosis not present

## 2013-10-15 DIAGNOSIS — Z794 Long term (current) use of insulin: Secondary | ICD-10-CM | POA: Diagnosis not present

## 2013-10-15 DIAGNOSIS — Z951 Presence of aortocoronary bypass graft: Secondary | ICD-10-CM | POA: Diagnosis not present

## 2013-10-15 DIAGNOSIS — I200 Unstable angina: Secondary | ICD-10-CM

## 2013-10-15 DIAGNOSIS — I25110 Atherosclerotic heart disease of native coronary artery with unstable angina pectoris: Secondary | ICD-10-CM | POA: Diagnosis not present

## 2013-10-15 DIAGNOSIS — Z955 Presence of coronary angioplasty implant and graft: Secondary | ICD-10-CM | POA: Diagnosis not present

## 2013-10-15 DIAGNOSIS — Z7902 Long term (current) use of antithrombotics/antiplatelets: Secondary | ICD-10-CM | POA: Insufficient documentation

## 2013-10-15 DIAGNOSIS — I2510 Atherosclerotic heart disease of native coronary artery without angina pectoris: Secondary | ICD-10-CM

## 2013-10-15 DIAGNOSIS — Z8673 Personal history of transient ischemic attack (TIA), and cerebral infarction without residual deficits: Secondary | ICD-10-CM | POA: Insufficient documentation

## 2013-10-15 DIAGNOSIS — Z01818 Encounter for other preprocedural examination: Secondary | ICD-10-CM

## 2013-10-15 DIAGNOSIS — I5042 Chronic combined systolic (congestive) and diastolic (congestive) heart failure: Secondary | ICD-10-CM

## 2013-10-15 DIAGNOSIS — Z79899 Other long term (current) drug therapy: Secondary | ICD-10-CM | POA: Diagnosis not present

## 2013-10-15 DIAGNOSIS — E785 Hyperlipidemia, unspecified: Secondary | ICD-10-CM | POA: Diagnosis not present

## 2013-10-15 DIAGNOSIS — I2584 Coronary atherosclerosis due to calcified coronary lesion: Secondary | ICD-10-CM | POA: Diagnosis not present

## 2013-10-15 DIAGNOSIS — T82858A Stenosis of vascular prosthetic devices, implants and grafts, initial encounter: Secondary | ICD-10-CM | POA: Diagnosis not present

## 2013-10-15 DIAGNOSIS — E039 Hypothyroidism, unspecified: Secondary | ICD-10-CM | POA: Diagnosis not present

## 2013-10-15 DIAGNOSIS — Z9861 Coronary angioplasty status: Secondary | ICD-10-CM

## 2013-10-15 DIAGNOSIS — T82857A Stenosis of cardiac prosthetic devices, implants and grafts, initial encounter: Secondary | ICD-10-CM

## 2013-10-15 DIAGNOSIS — I10 Essential (primary) hypertension: Secondary | ICD-10-CM

## 2013-10-15 DIAGNOSIS — I2582 Chronic total occlusion of coronary artery: Secondary | ICD-10-CM | POA: Insufficient documentation

## 2013-10-15 DIAGNOSIS — I120 Hypertensive chronic kidney disease with stage 5 chronic kidney disease or end stage renal disease: Secondary | ICD-10-CM | POA: Diagnosis not present

## 2013-10-15 DIAGNOSIS — M109 Gout, unspecified: Secondary | ICD-10-CM | POA: Insufficient documentation

## 2013-10-15 HISTORY — PX: PERCUTANEOUS CORONARY STENT INTERVENTION (PCI-S): SHX6016

## 2013-10-15 HISTORY — PX: LEFT HEART CATHETERIZATION WITH CORONARY/GRAFT ANGIOGRAM: SHX5450

## 2013-10-15 LAB — POCT ACTIVATED CLOTTING TIME
ACTIVATED CLOTTING TIME: 270 s
Activated Clotting Time: 202 seconds
Activated Clotting Time: 264 seconds
Activated Clotting Time: 163 seconds

## 2013-10-15 LAB — BASIC METABOLIC PANEL
Anion gap: 13 (ref 5–15)
BUN: 24 mg/dL — AB (ref 6–23)
CO2: 29 meq/L (ref 19–32)
Calcium: 9.9 mg/dL (ref 8.4–10.5)
Chloride: 95 mEq/L — ABNORMAL LOW (ref 96–112)
Creatinine, Ser: 3.89 mg/dL — ABNORMAL HIGH (ref 0.50–1.35)
GFR calc Af Amer: 16 mL/min — ABNORMAL LOW (ref >90)
GFR, EST NON AFRICAN AMERICAN: 14 mL/min — AB (ref >90)
Glucose, Bld: 136 mg/dL — ABNORMAL HIGH (ref 70–99)
POTASSIUM: 3.8 meq/L (ref 3.7–5.3)
SODIUM: 137 meq/L (ref 137–147)

## 2013-10-15 LAB — CBC
HCT: 32.9 % — ABNORMAL LOW (ref 39.0–52.0)
HEMOGLOBIN: 11.1 g/dL — AB (ref 13.0–17.0)
MCH: 30.8 pg (ref 26.0–34.0)
MCHC: 33.7 g/dL (ref 30.0–36.0)
MCV: 91.4 fL (ref 78.0–100.0)
PLATELETS: 190 10*3/uL (ref 150–400)
RBC: 3.6 MIL/uL — ABNORMAL LOW (ref 4.22–5.81)
RDW: 14.4 % (ref 11.5–15.5)
WBC: 7.5 10*3/uL (ref 4.0–10.5)

## 2013-10-15 LAB — GLUCOSE, CAPILLARY
GLUCOSE-CAPILLARY: 173 mg/dL — AB (ref 70–99)
Glucose-Capillary: 134 mg/dL — ABNORMAL HIGH (ref 70–99)
Glucose-Capillary: 207 mg/dL — ABNORMAL HIGH (ref 70–99)

## 2013-10-15 LAB — PROTIME-INR
INR: 1.02 (ref 0.00–1.49)
Prothrombin Time: 13.4 seconds (ref 11.6–15.2)

## 2013-10-15 SURGERY — LEFT HEART CATHETERIZATION WITH CORONARY/GRAFT ANGIOGRAM
Anesthesia: LOCAL

## 2013-10-15 MED ORDER — NITROGLYCERIN 0.4 MG/SPRAY TL SOLN
Status: AC
  Filled 2013-10-15: qty 4.9

## 2013-10-15 MED ORDER — HYDRALAZINE HCL 20 MG/ML IJ SOLN
20.0000 mg | Freq: Once | INTRAMUSCULAR | Status: AC
  Administered 2013-10-15: 20 mg via INTRAVENOUS

## 2013-10-15 MED ORDER — ACETAMINOPHEN 325 MG PO TABS: 650.0000 mg | ORAL_TABLET | ORAL | Status: DC | PRN

## 2013-10-15 MED ORDER — FENTANYL CITRATE 0.05 MG/ML IJ SOLN
INTRAMUSCULAR | Status: AC
  Filled 2013-10-15: qty 2

## 2013-10-15 MED ORDER — SODIUM CHLORIDE 0.9 % IV SOLN: 1.0000 mL/kg/h | INTRAVENOUS | Status: AC

## 2013-10-15 MED ORDER — MORPHINE SULFATE 2 MG/ML IJ SOLN: 2.0000 mg | INTRAMUSCULAR | Status: DC | PRN

## 2013-10-15 MED ORDER — ASPIRIN 81 MG PO CHEW
81.0000 mg | CHEWABLE_TABLET | Freq: Every day | ORAL | Status: DC
  Administered 2013-10-15: 81 mg via ORAL
  Filled 2013-10-15 (×2): qty 1

## 2013-10-15 MED ORDER — HYDROMORPHONE HCL 1 MG/ML IJ SOLN
INTRAMUSCULAR | Status: AC
  Filled 2013-10-15: qty 1

## 2013-10-15 MED ORDER — ISOSORBIDE MONONITRATE ER 60 MG PO TB24: 60.0000 mg | ORAL_TABLET | Freq: Two times a day (BID) | ORAL | Status: DC

## 2013-10-15 MED ORDER — HYDRALAZINE HCL 20 MG/ML IJ SOLN
INTRAMUSCULAR | Status: AC
  Filled 2013-10-15: qty 1

## 2013-10-15 MED ORDER — INSULIN NPH (HUMAN) (ISOPHANE) 100 UNIT/ML ~~LOC~~ SUSP
15.0000 [IU] | Freq: Two times a day (BID) | SUBCUTANEOUS | Status: DC
  Administered 2013-10-15: 15 [IU] via SUBCUTANEOUS
  Filled 2013-10-15: qty 10

## 2013-10-15 MED ORDER — MIDAZOLAM HCL 2 MG/2ML IJ SOLN
INTRAMUSCULAR | Status: AC
Start: 2013-10-15 — End: 2013-10-15
  Filled 2013-10-15: qty 2

## 2013-10-15 MED ORDER — LEVOTHYROXINE SODIUM 125 MCG PO TABS
125.0000 ug | ORAL_TABLET | Freq: Every day | ORAL | Status: DC
  Administered 2013-10-16: 125 ug via ORAL
  Filled 2013-10-15 (×2): qty 1

## 2013-10-15 MED ORDER — ATORVASTATIN CALCIUM 40 MG PO TABS
40.0000 mg | ORAL_TABLET | Freq: Every day | ORAL | Status: DC
  Filled 2013-10-15: qty 1

## 2013-10-15 MED ORDER — LOPERAMIDE HCL 2 MG PO CAPS: 2.0000 mg | ORAL_CAPSULE | Freq: Every day | ORAL | Status: DC | PRN

## 2013-10-15 MED ORDER — MORPHINE SULFATE 10 MG/ML IJ SOLN
INTRAMUSCULAR | Status: AC
  Filled 2013-10-15: qty 1

## 2013-10-15 MED ORDER — HEPARIN (PORCINE) IN NACL 2-0.9 UNIT/ML-% IJ SOLN
INTRAMUSCULAR | Status: AC
Start: 2013-10-15 — End: 2013-10-15
  Filled 2013-10-15: qty 1500

## 2013-10-15 MED ORDER — NITROGLYCERIN 0.4 MG SL SUBL: 0.4000 mg | SUBLINGUAL_TABLET | SUBLINGUAL | Status: DC | PRN

## 2013-10-15 MED ORDER — INSULIN NPH (HUMAN) (ISOPHANE) 100 UNIT/ML ~~LOC~~ SUSP
15.0000 [IU] | Freq: Two times a day (BID) | SUBCUTANEOUS | Status: DC
Start: 2013-10-15 — End: 2013-10-15

## 2013-10-15 MED ORDER — TERAZOSIN HCL 2 MG PO CAPS
2.0000 mg | ORAL_CAPSULE | Freq: Every day | ORAL | Status: DC
  Administered 2013-10-15: 22:00:00 2 mg via ORAL
  Filled 2013-10-15 (×2): qty 1

## 2013-10-15 MED ORDER — CLOPIDOGREL BISULFATE 75 MG PO TABS
75.0000 mg | ORAL_TABLET | Freq: Every day | ORAL | Status: DC
  Administered 2013-10-16: 10:00:00 75 mg via ORAL
  Filled 2013-10-15: qty 1

## 2013-10-15 MED ORDER — ISOSORBIDE MONONITRATE ER 60 MG PO TB24
60.0000 mg | ORAL_TABLET | Freq: Two times a day (BID) | ORAL | Status: DC
  Administered 2013-10-15 – 2013-10-16 (×2): 60 mg via ORAL
  Filled 2013-10-15 (×3): qty 1

## 2013-10-15 MED ORDER — SODIUM CHLORIDE 0.9 % IV SOLN
INTRAVENOUS | Status: DC
  Administered 2013-10-15: 09:00:00 via INTRAVENOUS

## 2013-10-15 MED ORDER — HEPARIN SODIUM (PORCINE) 1000 UNIT/ML IJ SOLN
INTRAMUSCULAR | Status: AC
  Filled 2013-10-15: qty 1

## 2013-10-15 MED ORDER — LIDOCAINE-PRILOCAINE 2.5-2.5 % EX CREA
1.0000 | TOPICAL_CREAM | Freq: Every day | CUTANEOUS | Status: DC | PRN
Start: 2013-10-15 — End: 2013-10-16
  Filled 2013-10-15: qty 5

## 2013-10-15 MED ORDER — ALLOPURINOL 100 MG PO TABS
100.0000 mg | ORAL_TABLET | Freq: Every day | ORAL | Status: DC
  Administered 2013-10-16: 100 mg via ORAL
  Filled 2013-10-15: qty 1

## 2013-10-15 MED ORDER — SEVELAMER CARBONATE 800 MG PO TABS
2400.0000 mg | ORAL_TABLET | Freq: Every day | ORAL | Status: DC
Start: 2013-10-15 — End: 2013-10-16
  Administered 2013-10-16: 2400 mg via ORAL
  Filled 2013-10-15 (×2): qty 3

## 2013-10-15 MED ORDER — LIDOCAINE HCL (PF) 1 % IJ SOLN
INTRAMUSCULAR | Status: AC
Start: 2013-10-15 — End: 2013-10-15
  Filled 2013-10-15: qty 30

## 2013-10-15 MED ORDER — ONDANSETRON HCL 4 MG/2ML IJ SOLN: 4.0000 mg | Freq: Four times a day (QID) | INTRAMUSCULAR | Status: DC | PRN

## 2013-10-15 MED ORDER — NITROGLYCERIN 1 MG/10 ML FOR IR/CATH LAB
INTRA_ARTERIAL | Status: AC
  Filled 2013-10-15: qty 10

## 2013-10-15 MED ORDER — SODIUM CHLORIDE 0.9 % IJ SOLN: 3.0000 mL | INTRAMUSCULAR | Status: DC | PRN

## 2013-10-15 MED ORDER — CARVEDILOL 25 MG PO TABS
25.0000 mg | ORAL_TABLET | Freq: Two times a day (BID) | ORAL | Status: DC
  Administered 2013-10-15 – 2013-10-16 (×2): 25 mg via ORAL
  Filled 2013-10-15 (×4): qty 1

## 2013-10-15 MED ORDER — RENA-VITE PO TABS
1.0000 | ORAL_TABLET | Freq: Every day | ORAL | Status: DC
  Administered 2013-10-15: 22:00:00 1 via ORAL
  Filled 2013-10-15 (×2): qty 1

## 2013-10-15 NOTE — Interval H&P Note (Signed)
History and Physical Interval Note:  10/15/2013 11:16 AM  I saw [name] on 09/28/2013: This is an addendum to that visit.  Post contact him by Dr.Colodonato from Nephrology who was concerned that [name] is having progressively worsening anginal symptoms. Over the last week he started to have more more resting anginal symptoms which are more persistent requiring more nitroglycerin sublingual. With each episode he is becoming diaphoretic and dyspneic.  Based on his known history, I don't feel like there is any need to do a noninvasive evaluations. Based on the clinical recommendation from his nephrologist, the signs and symptoms are very concerning for crescendo angina above and beyond his baseline stable angina. He last underwent PCI in April but PCI to distal RCA. At this point I think the best course of action would be to return to the cardiac catheterization lab for invasive evaluation plus or minus PCI.  The patient will be contacted and will be scheduled for cardiac catheterization on Thursday, October 7.  Plan will be for him to undergo dialysis on October 6 as he tends to have more angina on his predialysis days.  The patient is well familiar with the procedure as well as risks, benefits, alternatives and indications.  Patient Active Problem List    Diagnosis  Date Noted   .  Unstable angina - crescendo class 3-4 angina now at rest  10/13/2013     Priority: High   .  History of Non-ST elevated myocardial infarction (non-STEMI)      Priority: High   .  CAD (coronary artery disease) of bypass graft - PCI to SVG-OM with BMS; PCI-mid RCA with a Xience DES  09/29/2012     Priority: High   .  End stage renal disease - on hemodialysis  05/01/2011     Priority: High   .  CAD S/P percutaneous coronary angioplasty  10/09/2010     Priority: High   .  Diabetes mellitus with neuropathy  12/08/2007     Priority: High   .  Chronic combined systolic and diastolic congestive heart failure, NYHA  class 2  02/15/2009     Priority: Medium   .  Hyperlipidemia with target LDL less than 70  12/08/2007     Priority: Medium   .  Essential hypertension  12/08/2007     Priority: Medium   .  Presence of drug coated stent in right coronary artery  04/30/2013   .  Personal history of other diseases of digestive disease  08/03/2009   .  CEREBROVASCULAR ACCIDENT, HX OF  12/08/2007   PLAN: [facility] WITH GRAFT +/- PCI  [name] [name]  has presented today for surgery, with the diagnosis of Crescendo / Unstable Angina  The various methods of treatment have been discussed with the patient and family. After consideration of risks, benefits and other options for treatment, the patient has consented to  Procedure(s): LEFT HEART CATHETERIZATION WITH CORONARY/GRAFT ANGIOGRAM (N/A) +/- PCI as a surgical intervention .  The patient's history has been reviewed, patient examined, no change in status, stable for surgery.  I have reviewed the patient's chart and labs.  Questions were answered to the patient's satisfaction.     Cath Lab Visit (complete for each Cath Lab visit)  Clinical Evaluation Leading to the Procedure:   ACS: No.  Non-ACS:    Anginal Classification: CCS IV  Anti-ischemic medical therapy: Maximal Therapy (2 or more classes of medications)  Non-Invasive Test Results: No non-invasive testing performed  Prior CABG: No previous CABG   [name] [name]

## 2013-10-15 NOTE — H&P (View-Only) (Signed)
PCP: Annye Asa, MD  Clinic Note: Chief Complaint  Patient presents with  . ROV 3 months    C/o chest pain-daily and takes ~1-3 NTG, occas shortness of breath and lightheadedness.    HPI: [name] [name] is a 73 y.o. male with a PMH below who presents today for a three-month followup of his chronic stable angina with known CAD status post PCI on multiple occasions after redo CABG. I last saw him in June 2015, and he was doing relatively well without any major complaints.  He still had chronic intermittent angina that sounded like it was more in a predialysis day. However on review he currently thinks it really hasn't made much difference. He tried taking an additional 30 mg of Imdur during the day as a second dose but did not notice any significant difference in symptoms. The episodes are usually always relieved with one sublingual nitroglycerin.  Past Medical History  Diagnosis Date  . Diabetes mellitus   . Hyperlipidemia   . Hypertension   . Gout   . Hypothyroidism     On supplementation  . LBBB (left bundle branch block)     Chronic  . History of colon cancer 2003    colectomy for CA. no recurrence . never required  radiation or chem  . Anemia     Likely secondary to history of GI bleed  . Stroke 1998  . End stage renal disease on dialysis     Dialyzes at [facility]: Tues/Th/Sat - left upper cavity AV fistula brachiocephalic  . CAD in native artery; and the grafts 1992, 1997, 2002, 2006, 2012    CABG 1992 and redo CABG 2006  . CAD (coronary artery disease) of bypass graft 2006, 2012    In 2006: Occluded SVG-OM noted (SVG-diagonal was previously occluded); 2012: Severe lesion in redo SVG-OM1 --> BMS PCI   . CAD S/P percutaneous coronary angioplasty October 2012; Feb & Apr 2015    10/2010: Non-STEMI: PCI to SVG-OM1 Integrity BMS 3.5 mm x 15 mm (3.85 mm);; 02/2013: PCI to mid RCA Xience Apling DES 2.75 mm x 15 mm (3.1 mm) ;; 04/2013 : dRCA  Integrity Resolute DES   2.25  mm x 41mm (2.5 mm)  . History of nuclear stress test Sept 2014    EF 39% (down from) 45%, no ischemia; but now the inferobasal perfusion defect is determined to be likley prior infarction (as opposed to diaphragmatic attenuation)  . Echocardiogram findings abnormal, without diagnosis [id number]; Oct 2014    EF 50-55%, anteroseptal hypokinesis. Mild to moderate concentric LVH. Mild MR, aortic sclerosis. Mild atrial dilatation bilaterally.; Oct - mild LVH, EF ~50-55%, incoordinate septal WM, - with no other notable WMA  . History of Non-ST elevated myocardial infarction (non-STEMI) 1992, 2006, 2012; 02/2013  . Peptic ulcer disease  2004  . BPH (benign prostatic hyperplasia)   . Chronic low back pain     Prior Cardiac Evaluation and Past Surgical History: Past Surgical History  Procedure Laterality Date  . Appendectomy    . Cholecystectomy  2009  . Eye surgery      bilateral cataracts   . Spinal fusion    . Av fistula repair Left     5/11  . Colon resection      transverse and proximal descending w/ primar anastomosis  . Doppler echocardiography  01/25/2012    EF 50 to 55%  . Nm myocar perf wall motion  10/12/2011    EF 54%,LV normal ;  no signifiant ischemia  . Cardiac catheterization  10/16/2010    patent  LIMA to the LAD , PATENT  svg TO 2nd marginals ,occluded PLA with collaterals and SVG to the OM  had 90% stenosis  was txlge  bare - metal  3.5  Integrity ten  postdilate  36-37  mm    . Cardiac catheterization  03/22/2004    loss of both SVG,severe disease in prox and ostial  circ not amenable to invention. mod diease distal LAD  AFTER BYPASS GRAFT;-CVTS to evaluate   . Coronary angioplasty  04/03/1995    OM and Circ  . Coronary angioplasty  02/01/2000    [facility]  . Carotid doppler  05/23/2012    ABN CAROTID-- RGT BULB/PROX ICA mild to mod 50-60%;lft bulb/prox mild to mod 0-49%;left subclavian abn waveforms consistent with patients lft arm A/V fistula  . Event monitor   01/23/2012-02/06/2012    SINUS ,LBBB,unifocal PVCs  . Coronary artery bypass graft  08/22/1990    INITIAL:LIMA to LAD, SVG to  Diagonal, SVG to OM  . Coronary artery bypass graft  2006    SVG to OM1,SVG to OM2 with patent LIMA  to LAD and occluded vein  graft to the diagonal  and occluded vein grft to OM from  prior  surgery  . Percutaneous coronary stent intervention (pci-s)  02/23/2013    mid RCA 80% & 70% - Xience 2.75 mm x 15 mm ( 3.69mm) ; LIMA-LAD patent, SVG-OM patent (stent patent); SVG-Diag patent.  . Transthoracic echocardiogram  02/23/2013    Moderate concentric hypertrophy. EF 4500%. Septal bounce. Grade 2 diastolic dysfunction (pseudo-normal - severely elevated filling pressures) mild to moderate MR and moderate LA dilation to  . Lower extremity arterial dopplers  October 2014    Calcified but non-occlusive peripheral arteries. No evidence of stenosis  . Percutaneous coronary stent intervention (pci-s)  April 2015    dRCA - Integrity Resolute DES   2.25 mm x 70mm (2.5 mm)    Interval History:  [name] [name] returns today we leave without any major differences in his presentation symptoms from before. He does not have any symptoms of PND orthopnea minimal if any edema. He does have almost every day in various times and at least one episode of chest discomfort but also for muscle take nitroglycerin. He may need to take nitroglycerin once every couple days. These episodes can happen in the morning or the evening of her off dialysis days. Not necessarily associated as much with exertion however. He is not that active endoscopic brushings of according to his description. He doesn't have angina during dialysis. He has had some orthostatic hypotension episode following dialysis. No true syncope or near syncope. No claudication. He initially said that he gets in his angina type pain more often in the afternoon after eating a meal. However he can also happen sometimes at night or in the morning. Usually he is  not exacerbated by exertion. Partly because he does not exert himself much. He also denies exertional dyspnea.  No PND, orthopnea or edema. No palpitations, weakness or syncope/near syncope. No TIA/amaurosis fugax symptoms. No melena, hematochezia, hematuria, or epstaxis. No claudication.  ROS: A comprehensive Review of Systems - was performed Review of Systems  Constitutional: Negative.  Negative for fever and chills.  HENT: Negative for nosebleeds.   Respiratory: Negative for cough, hemoptysis, sputum production, shortness of breath and wheezing.   Cardiovascular: Positive for chest pain.       Per history of  present illness  Gastrointestinal: Negative for blood in stool and melena.  Genitourinary: Negative for hematuria.  Musculoskeletal: Negative for back pain.  Neurological: Negative for dizziness, tingling, tremors, sensory change, speech change, focal weakness, seizures, loss of consciousness and headaches.  Endo/Heme/Allergies: Does not bruise/bleed easily.  Psychiatric/Behavioral: Negative for depression and hallucinations. The patient is not nervous/anxious.   All other systems reviewed and are negative.  ALLERGIES REVIEWED IN EPIC -- no change  Current Outpatient Prescriptions on File Prior to Visit  Medication Sig Dispense Refill  . allopurinol (ZYLOPRIM) 100 MG tablet TAKE ONE TABLET BY MOUTH ONCE DAILY  90 tablet  1  . aspirin 81 MG chewable tablet Chew 81 mg by mouth at bedtime.      Marland Kitchen atorvastatin (LIPITOR) 40 MG tablet Take 40 mg by mouth at bedtime.      Marland Kitchen atorvastatin (LIPITOR) 40 MG tablet TAKE ONE TABLET BY MOUTH ONCE DAILY  90 tablet  2  . Besifloxacin HCl (BESIVANCE) 0.6 % SUSP Place 1 drop into both eyes See admin instructions. Use eye drops 4 times daily for 2 days following injection by Dr. Zigmund Daniel (once a month)      . carvedilol (COREG) 25 MG tablet TAKE ONE TABLET BY MOUTH TWICE DAILY WITH MEALS  180 tablet  1  . clopidogrel (PLAVIX) 75 MG tablet TAKE ONE  TABLET BY MOUTH ONCE DAILY  90 tablet  1  . glucose blood (PRODIGY NO CODING BLOOD GLUC) test strip Test as directed  100 each  6  . insulin NPH Human (NOVOLIN N) 100 UNIT/ML injection Inject 10-20 Units into the skin 2 (two) times daily before a meal. Per sliding scale      . isosorbide mononitrate (IMDUR) 60 MG 24 hr tablet Take 1 tablet (60 mg total) by mouth 2 (two) times daily.  180 tablet  3  . levothyroxine (SYNTHROID, LEVOTHROID) 125 MCG tablet TAKE ONE TABLET BY MOUTH ONCE DAILY  90 tablet  0  . lidocaine-prilocaine (EMLA) cream Apply 1 application topically as needed (prior to dialysis treatments).      . loperamide (IMODIUM) 2 MG capsule Take 2 mg by mouth 4 (four) times daily as needed for diarrhea or loose stools.       . multivitamin (RENA-VIT) TABS tablet Take 1 tablet by mouth daily.  90 tablet  3  . sevelamer carbonate (RENVELA) 800 MG tablet Take 800 mg by mouth 3 (three) times daily with meals.      . temazepam (RESTORIL) 15 MG capsule Take 1 capsule (15 mg total) by mouth at bedtime as needed for sleep.  30 capsule  3  . terazosin (HYTRIN) 2 MG capsule TAKE ONE CAPSULE BY MOUTH ONCE DAILY AT BEDTIME  90 capsule  0   No current facility-administered medications on file prior to visit.    SOCIAL AND FAMILY HISTORY REVIEWED IN EPIC -- no change  Wt Readings from Last 3 Encounters:  09/28/13 172 lb 3.2 oz (78.109 kg)  07/15/13 167 lb (75.751 kg)  06/22/13 174 lb 3.2 oz (79.017 kg)    PHYSICAL EXAM BP 120/70  Pulse 57  Ht 5\' 5"  (1.651 m)  Wt 172 lb 3.2 oz (78.109 kg)  BMI 28.66 kg/m2 General appearance: alert, cooperative, appears stated age, no distress; and Borderline obese. Chronically ill-appearing, pale, well groomed.Marland Kitchen answers questions appropriately.  Neck: no adenopathy, no carotid bruit, mild JVD; Supple, symmetrical, trachea midline  Lungs: clear to auscultation bilaterally, normal percussion bilaterally and Nonlabored, good air  movement  Heart: normal apical  impulse, RRR, S1, S2 normal, S4 present, SEM 2/6, crescendo, decrescendo and harsh at 2nd right intercostal space, no click and no rub  Abdomen: soft, non-tender; bowel sounds normal; no masses, no organomegaly and Mild truncal obesity  Extremities: extremities normal, atraumatic, no cyanosis or edema and fistula in the left upper arm with good thrill  Pulses: 2+ and symmetric  Neurologic: Grossly normal   Adult ECG Report  Rate:  57 ;  Rhythm: sinus bradycardia and Left bundle-branch block. Otherwise stable  Narrative Interpretation:  Stable EKG  Recent Labs:  07/15/2013   TC 88, TG 287, HDL 26, LDL 5  ASSESSMENT / PLAN: Angina, class II - stable His anginal symptoms are pretty much stable. He seems quite happy with nitroglycerin responsive chest discomfort maybe once a day about once every other today. He is not overly extended and the constant long-acting nitrate increase of this particular point, since he did not really notice much improvement of the problem. It makes her more dizzy more headache. He does not feel or any more invasive evaluation. Plan: Continue current dose of beta blocker, nitrate as well as aspirin plus Plavix.    CAD S/P percutaneous coronary angioplasty Stable with recent PCI. He is currently on dual antiplatelet therapy. If there are issues with bleeding or bruising, we can stop aspirin. He is also on a stable dose of beta blocker and statin.  CAD (coronary artery disease) of bypass graft - PCI to SVG-OM with BMS; PCI-mid RCA with a Xience DES He has graft disease as noted above. No plans for checking a stress test anytime soon unless symptoms change.  Hyperlipidemia with target LDL less than 70 Chronic issue. On statin. Last labs were relatively well controlled except for triglycerides. We may need to consider adding a fibrate or prescription dose of omega-3 fatty acid as well Lovaza  Orthopnea Not as much of an issue following his recent PCI, and adjusted dry  weights on dialysis  Essential hypertension Blood pressure was pretty good today. We will keep her shoe horn, because he has had orthostatic hypotension in the past. His pre-and post dialysis days are always difficult.    Orders Placed This Encounter  Procedures  . EKG 12-Lead   Meds ordered this encounter  Medications  . nitroGLYCERIN (NITROSTAT) 0.4 MG SL tablet    Sig: Place 1 tablet (0.4 mg total) under the tongue every 5 (five) minutes as needed for chest pain.    Dispense:  100 tablet    Refill:  4     Followup:  4-6 months   HARDING,DAVID W, M.D., M.S. Interventional Cardiologist   Pager # [phone]

## 2013-10-15 NOTE — Progress Notes (Signed)
Pt waiting for bed placement and is able to tolerate fluids and food well.

## 2013-10-15 NOTE — CV Procedure (Signed)
CARDIAC CATHETERIZATION and PERCUTANEOUS CORONARY INTERVENTION REPORT  NAME:  [name] [name]   MRN: [id number] DOB:  November 29, 1940   ADMIT DATE: 10/15/2013 Procedure Date: 10/15/2013  INTERVENTIONAL CARDIOLOGIST: Leonie Man, M.D., MS PRIMARY CARE PROVIDER: Annye Asa, MD PRIMARY CARDIOLOGIST:  PATIENT:  [name] [name] is a 73 y.o. male with a long history of coronary disease status post redo CABG who is at PCI to a SVG-OM2 with an Integrity Bare-Metal Stent. He has had chronic stable angina, but subsequently suffered a non-ST elevation MI in February of 2015. At that time underwent PCI of the RCA with a Xience DES 2.75 mm x 15 mm for severe mid RCA stenosis. His grafts were otherwise patent. He then returned 2 months later for a C1 focal stenting of the very distal RCA with a 90% napkin lesion treated with a short Integrity Resolute DES 2.25 mm x 8 mm - post dilated to 2.5 mm.   He is continued to have stable angina but progressively worse despite medical therapy. He is on high doses of Imdur and other medications as best tolerated with his intermittent hypotension during dialysis. He is chronic diastolic heart failure as well. When last seen in the clinic he was pretty much stable, however he was seen by Dr. Arty Baumgartner from nephrology earlier this week who noted that [place] is complaining of resting angina and essentially class IV angina symptoms concerning for unstable angina. We decided the best course of action would be just to proceed directly to cardiac catheterization on 9 come back to the clinic to be seen this was scheduled for today and plans to have him dialyzed tomorrow postcatheterization.  PRE-OPERATIVE DIAGNOSIS:    Crescendo Class III up to 4 Angina concerning for Unstable Angina  Known significant CAD with relatively recent 2 Site PCI of the RCA  PROCEDURES PERFORMED:    Left Heart Catheterization with Native Coronary and Graft Angiography  via Right Common Femoral Artery    Complex/difficult PCI of mid RCA 99% in-stent restenosis of the stent placed in February of 2015 -   requiring use of a GuideLiner catheter to advance a cutting balloon into the mid vessel   PROCEDURE: The patient was brought to the [facility]nd [facility] in the fasting state and prepped and draped in the usual sterile fashion for Right Common Femoral Artery access. Radial access was not used as he is a dialysis patient. Sterile technique was used including antiseptics, cap, gloves, gown, hand hygiene, mask and sheet. Skin prep: Chlorhexidine.   Consent: Risks of procedure as well as the alternatives and risks of each were explained to the (patient/caregiver). Consent for procedure obtained.   Time Out: Verified patient identification, verified procedure, site/side was marked, verified correct patient position, special equipment/implants available, medications/allergies/relevent history reviewed, required imaging and test results available. Performed.  Access:   Right Common Femoral Artery: 5 Fr Sheath -   fluoroscopically guided modified Seldinger Technique   Left Heart Catheterization: 5 Fr Catheters advanced or exchanged over a standard J-wire; JL4 catheter advanced first.   Left Coronary Artery Cineangiography: JL4 Catheter   Right Coronary Artery, SVG-OM 2, SVG-OM 3, and LIMA-LAD Angiography: JR 4 LV Hemodynamics: JR 4  Sheath removed in the PACU holding area with manual pressure for hemostasis.   FINDINGS:  Hemodynamics:   Central Aortic Pressure / Mean: 172/59/100 mmHg  Left Ventricular Pressure / LVEDP: 179/14/1 mmHg  Left Ventriculography: Deferred  Coronary Anatomy:  Left Main: Very small  caliber residual vessel with 50-70% stenosis. Extensive calcification.  LAD: Very small caliber vessel that is occluded after first small diagonal/S/P branch   LIMA-LAD: Widely patent graft to a very small caliber distal LAD with diffuse luminal  irregularities.  Left Circumflex: 100% flush occlusion at ostium   SVG-OM2: Widely patent graft with patent stent; the perfused OM branch is small and diffusely diseased but with brisk flow.   SVG-OM3: Widely patent graft that also fills a very small caliber vessels OM branch with mild diffuse disease.  RCA: Large caliber, dominant vessel with continued to have a 40-60% calcified stenosis in the proximal segment after the first bend. There is vessel (right at the RV Marginal take-of) has really been 95-99% in-stent restenosis. There is also a hazy/hyperlucent area at the distal stent edge that has the appearance of possible distal edge dissection flap. Beyond this the RCA is relatively normal with , with a widely patent distal stent prior to terminating in the Right Posterior Descending Artery (RPDA). There is a focal progress stenosis just beyond the stent.. The remainder the PDA is free of any significant disease is mild diffuse luminal irregularities  After reviewing the initial angiography, the culprit lesion was thought to be combination of severe in-stent restenosis of the recently placed DES stent to the RCA as well as a possible distal edge dissection flap.  Preparation were made to proceed with PCI on this lesion.  Percutaneous Coronary Intervention:  Sheath exchanged for 6 Fr Guide: 6 Fr   JL4 Guidewire: Prowater; 8 second wire was used for buddy wire this was then removed as this approach was abandoned for Guideliner.  Predilation Balloon: Euphora 2.0 mm x 12 mm;   10 Atm x 25 Sec, for 2 inflations After multiple attempts to advance the FlexTome balloon 4 successful, a short 6 mm balloon was attempted to advance this lead was unsuccessful, a Glidewire was used that still continued to be unsuccessful. At this point the predilation balloon was then reinserted through a GuideLiner catheter was successfully advanced beyond the first 2 sharp angle bends of the RCA. This then allowed easy  passage of balloons and stents distally. With GuideLiner in place, the patient's pressures did appear to be ventricularized.  FlexTome Cutting Balloon: 3.0 mm x 10 mm;   12 Atm x 35 Sec, for 2 inflations proximal and distal within the stent Final Diameter: 3 mm. However the hyperlucent "flap" still appeared to be present at the distal stent edge. Therefore the decision was made to cover this area with a stent that would go from the proximal portion of the ascending stenosis and cover the distal end of the stent.   Stent: Promus Premier 2.75 mm x 16 mm;   16 Atm x 45 Sec -- final distal diameter 3.1 mm Post-dilation Balloon: Butler Trek 3.25 mm x 12 mm;   14 Atm x 30 Sec, for 2 inflations  Final Diameter: 3.3 mm  Post deployment angiography in multiple views, with and without guidewire in place revealed excellent stent deployment and lesion coverage.  There was no evidence of dissection or perforation. The distal flap appeared to be well opposed with no residual lesion.  MEDICATIONS:  Anesthesia:  Local Lidocaine 16 ml  Sedation:  1 mg IV Versed, 25 mcg IV fentanyl ; 2 mg IV morphine, 1.5 mg IV Dilaudid  Omnipaque Contrast: 170 ml  Anticoagulation:  IV Heparin 5000+ an additional 2500 & 1500 Units to maintain an ACT > 250 Sec  Anti-Platelet  Agent:  The patient is already on Plavix  Intracoronary Nitroglycerin 200 mcg x 1  PATIENT DISPOSITION:    The patient was transferred to the PACU holding area in a hemodynamicaly stable, chest pain free condition.  The patient tolerated the procedure well, and there were no complications.  EBL:   < 10 ml  The patient was stable before, during, and after the procedure.  POST-OPERATIVE DIAGNOSIS:    Severe native CAD as previously documented with essentially minimal native left coronary flow, severe 99% in-stent restenosis of mid RCA stent with distal edge flap. The distal RCA stent is widely patent with step down 30-40% stenosis. Normal flow  down the PDA.   Successful cutting balloon angioplasty followed by PCI of 99% in-stent restenosis of RCA and coverage of post stent dissection flap using Promus DES stent   Widely patent SVG-OM1 with patent graft, widely patent SVG-OM 2 and LIMA-LAD   Severe systemic hypertension with moderately elevated LVEDP  PLAN OF CARE:  Monitor overnight post PCI care. He will probably need additional blood pressure treatment.  Continue home medications, however his antihypertensive regimen will have to be figured out between nephrology and cardiology.  He is already on Dual Antiplatelet Therapy  Anticipate discharge tomorrow after dialysis.  Nephrology is aware the patient is here.    Leonie Man, M.D., M.S. Interventional Cardiologist   Pager # [phone]

## 2013-10-15 NOTE — Progress Notes (Signed)
Site area: Right groin a 67fr sheath was removed  Site Prior to Removal:  Level 0  Pressure Applied For 20 MINUTES    Minutes Beginning at 1430  Manual:   Yes.    Patient Status During Pull:  stable  Post Pull Groin Site:  Level 0  Post Pull Instructions Given:  Yes.    Post Pull Pulses Present:  Yes.    Dressing Applied:  Yes.    Comments:  VS remain stable during sheath pull.  Pt denies any discomfort at this time

## 2013-10-15 NOTE — Brief Op Note (Signed)
   NAME:  [name] [name]   MRN: [id number] DOB:  [birth year]   ADMIT DATE: 10/15/2013  Brief Cardiac Catheterization & PCI Note:  Indication: 1. 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 Most recent PCI was in April 2015 with a short DES stent the distal RCA. 2 months prior to that he had a non-STEMI with mid RCA lesion treated with a Xience DES 2.75 mm 15 mm stent.  He also has a history of a stent placed in the SVG-OM1.  his vein graft to OM 2 and LIMA-LAD and patent with each catheterization.  Procedures: 1. Left Heart Catheterization with Native Coronary and Graft Angiography via Right Common Femoral Artery access  Right Common Femoral Artery Access: 5 French sheath Modified Seldinger technique, fluoroscopic guided  JL4 and JR 4 catheters used for first Left Coronary then Native Right Coronary, SVG-OM1, SVG-12 and LIMA-LAD angiography. 2. Complex/difficult PCI of mid RCA 99% in-stent restenosis of the stent placed in February of 2015 - requiring use of a GuideLiner catheter to advance a cutting balloon into the mid vessel  5 [place] catheter exchanged for 6 [place]  JR 4 Guide, Therapist, sports (initially a Sports coach was used as a Designer, multimedia)  Predilation with an Euphora 2.0 mm x 12 mm; FlexTome cutting balloon 3.0 mm x 10 mm (unable to pass even with buddy wire)  Using the Euphora balloon, a GuideLiner catheter was advanced into the mid RCA allowing for easy passing of the FlexTome cutting balloon 3.0 mm x 10 mm -- 2 inflations at 12 atmospheres for 35 seconds.  The initial angiography also had revealed a hazy/hyperlucent area in the distal edge of the stent concerning for possible flap. In other views this did appear to have the appearance of a possible distal edge dissection. Therefore decision was made to cover that area as well as in-stent restenosis area with a new stent  Promus Premier DES 2.75 mg 16 mm:  Deployed at 16 Atm for 45 Sec  Post dilation with [place] 3.25 mm x 12 mm: 2 inflations at 14 Atm for 30 Sec  Post deployment angiography with and without wire revealed excellent lesion coverage. The distal edge dissection but no longer apparent.  Medications:  1 mg Versed IV; 25 mcg Fentanyl IV; 2 mg IV morphine, 1.5 mg Dilaudid  Omnipaque Contrast: 170 mL  Intracoronary Nitroglycerin 200 mcg x 1  IV Heparin: Initial bolus 5000 Units, 2 additional boluses of 2500 & 1500 Units administered to keep ACT>250Sec  Impression:  Severe native CAD as previously documented with essentially minimal native left coronary flow, severe 99% in-stent restenosis of mid RCA stent with distal edge flap. The distal RCA stent is widely patent with step down 30-40% stenosis. Normal flow down the PDA.  Successful cutting balloon angioplasty followed by PCI of 99% in-stent restenosis of RCA and coverage of post stent dissection flap using Promus DES stent  Widely patent SVG-OM1 with patent graft, widely patent SVG-OM 2 and LIMA-LAD  Hemodynamics:   Central AoP: Initial pressures 172/59/100 mmHg  LVP/EDP: 179/14/21 mmHg  Recommendations:  Monitor overnight for post PCI care.  Continue home medications  Anticipate discharge tomorrow after dialysis.   Full note to follow  Leonie Man, M.D., M.S. Interventional Cardiologist   Pager # [phone]     10/15/2013 1:21 PM

## 2013-10-15 NOTE — Progress Notes (Signed)
Dr  Ellyn Hack in to see pt and ordered Hydralazine 20 mg IV X1.

## 2013-10-16 DIAGNOSIS — I25700 Atherosclerosis of coronary artery bypass graft(s), unspecified, with unstable angina pectoris: Secondary | ICD-10-CM | POA: Diagnosis not present

## 2013-10-16 DIAGNOSIS — T82858A Stenosis of vascular prosthetic devices, implants and grafts, initial encounter: Secondary | ICD-10-CM | POA: Diagnosis not present

## 2013-10-16 DIAGNOSIS — I2582 Chronic total occlusion of coronary artery: Secondary | ICD-10-CM | POA: Diagnosis not present

## 2013-10-16 DIAGNOSIS — I2510 Atherosclerotic heart disease of native coronary artery without angina pectoris: Secondary | ICD-10-CM | POA: Diagnosis not present

## 2013-10-16 DIAGNOSIS — I2584 Coronary atherosclerosis due to calcified coronary lesion: Secondary | ICD-10-CM | POA: Diagnosis not present

## 2013-10-16 DIAGNOSIS — I200 Unstable angina: Secondary | ICD-10-CM

## 2013-10-16 DIAGNOSIS — I10 Essential (primary) hypertension: Secondary | ICD-10-CM

## 2013-10-16 DIAGNOSIS — Z9861 Coronary angioplasty status: Secondary | ICD-10-CM

## 2013-10-16 DIAGNOSIS — E785 Hyperlipidemia, unspecified: Secondary | ICD-10-CM

## 2013-10-16 DIAGNOSIS — N186 End stage renal disease: Secondary | ICD-10-CM | POA: Diagnosis not present

## 2013-10-16 DIAGNOSIS — I25110 Atherosclerotic heart disease of native coronary artery with unstable angina pectoris: Secondary | ICD-10-CM | POA: Diagnosis not present

## 2013-10-16 DIAGNOSIS — Z951 Presence of aortocoronary bypass graft: Secondary | ICD-10-CM | POA: Diagnosis not present

## 2013-10-16 DIAGNOSIS — Z955 Presence of coronary angioplasty implant and graft: Secondary | ICD-10-CM | POA: Diagnosis not present

## 2013-10-16 LAB — CBC
HCT: 30.4 % — ABNORMAL LOW (ref 39.0–52.0)
HEMOGLOBIN: 10.2 g/dL — AB (ref 13.0–17.0)
MCH: 30.2 pg (ref 26.0–34.0)
MCHC: 33.6 g/dL (ref 30.0–36.0)
MCV: 89.9 fL (ref 78.0–100.0)
Platelets: 200 10*3/uL (ref 150–400)
RBC: 3.38 MIL/uL — AB (ref 4.22–5.81)
RDW: 14.6 % (ref 11.5–15.5)
WBC: 9.1 10*3/uL (ref 4.0–10.5)

## 2013-10-16 LAB — BASIC METABOLIC PANEL
Anion gap: 15 (ref 5–15)
BUN: 36 mg/dL — ABNORMAL HIGH (ref 6–23)
CHLORIDE: 93 meq/L — AB (ref 96–112)
CO2: 28 mEq/L (ref 19–32)
Calcium: 9.6 mg/dL (ref 8.4–10.5)
Creatinine, Ser: 4.83 mg/dL — ABNORMAL HIGH (ref 0.50–1.35)
GFR calc Af Amer: 13 mL/min — ABNORMAL LOW (ref >90)
GFR calc non Af Amer: 11 mL/min — ABNORMAL LOW (ref >90)
GLUCOSE: 82 mg/dL (ref 70–99)
POTASSIUM: 3.9 meq/L (ref 3.7–5.3)
Sodium: 136 mEq/L — ABNORMAL LOW (ref 137–147)

## 2013-10-16 LAB — GLUCOSE, CAPILLARY: GLUCOSE-CAPILLARY: 84 mg/dL (ref 70–99)

## 2013-10-16 MED ORDER — CLOPIDOGREL BISULFATE 75 MG PO TABS: 75.0000 mg | ORAL_TABLET | Freq: Every day | ORAL | Status: DC

## 2013-10-16 NOTE — Progress Notes (Signed)
CARDIAC REHAB PHASE I   PRE:  Rate/Rhythm: 28 SR  BP:  Supine: 139/47  Sitting:   Standing:    SaO2:   MODE:  Ambulation: 100 ft   POST:  Rate/Rhythm: 69 SR  BP:  Supine:   Sitting: 143/40  Standing:    SaO2:  0810-0850 Pt tolerated ambulation well without c/o of cp or SOB. VS stable Pt to side of bed after walk with call light in reach. Completed stent discharge education with pt. He voices understanding. Pt declines Outpt. CRP due to hemodialysis.  Rodney Langton RN 10/16/2013 8:50 AM

## 2013-10-16 NOTE — Progress Notes (Signed)
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 ESRD on HD, DM, HTN, HLD. Seen in the office and admitted 10/08 for cardiac catheterization with possible percutaneous intervention  SUBJECTIVE: No Chest pain or SOB.  OBJECTIVE Filed Vitals:   10/15/13 2100 10/15/13 2157 10/16/13 0012 10/16/13 0413  BP: 159/54 142/43 122/41 136/59  Pulse: 72  73 60  Temp:   97.9 F (36.6 C) 98 F (36.7 C)  TempSrc:   Oral Oral  Resp:   18 20  Height:      Weight:   168 lb 10.4 oz (76.5 kg)   SpO2: 95%  97% 96%    Intake/Output Summary (Last 24 hours) at 10/16/13 0720 Last data filed at 10/15/13 2157  Gross per 24 hour  Intake    200 ml  Output      0 ml  Net    200 ml   Filed Weights   10/15/13 0832 10/16/13 0012  Weight: 165 lb (74.844 kg) 168 lb 10.4 oz (76.5 kg)    PHYSICAL EXAM General: Well developed, well nourished, male in no acute distress. Head: Normocephalic, atraumatic.  Neck: Supple without bruits, JVD not elevated. Lungs:  Resp regular and unlabored, few rales bases. Heart: RRR, S1, S2, no S3, S4, or murmur; no rub. Abdomen: Soft, non-tender, non-distended, BS + x 4.  Extremities: No clubbing, cyanosis, no edema. Cath site without ecchymosis/hematoma/bruit Neuro: Alert and oriented X 3. Moves all extremities spontaneously. Psych: Normal affect.  LABS: CBC:  Recent Labs  10/15/13 0904 10/16/13 0410  WBC 7.5 9.1  HGB 11.1* 10.2*  HCT 32.9* 30.4*  MCV 91.4 89.9  PLT 190 200   INR:  Recent Labs  10/15/13 0904  INR [id number]   Basic Metabolic Panel:  Recent Labs  10/15/13 0904 10/16/13 0410  NA 137 136*  K 3.8 3.9  CL 95* 93*  CO2 29 28  GLUCOSE 136* 82  BUN 24* 36*  CREATININE 3.89* 4.83*    CALCIUM 9.9 9.6    TELE:    SR    ECG:  PROCEDURES PERFORMED:  Left Heart Catheterization with Native Coronary and Graft Angiography via Right Common Femoral Artery  Complex/difficult PCI of mid RCA 99% in-stent restenosis of the stent placed in February of 2015 -  requiring use of a GuideLiner catheter to advance a cutting balloon into the mid vessel Coronary Anatomy:  Left Main: Very small caliber residual vessel with 50-70% stenosis. Extensive calcification. LAD: Very small caliber vessel that is occluded after first small diagonal/S/P branch  LIMA-LAD: Widely patent graft to a very small caliber distal LAD with diffuse luminal irregularities. Left Circumflex: 100% flush occlusion at ostium  SVG-OM2: Widely patent graft with patent stent; the perfused OM branch is small and diffusely diseased but with brisk flow.  SVG-OM3: Widely patent graft that also fills a very small caliber vessels OM branch with mild diffuse disease. RCA: Large caliber, dominant vessel with continued to have a 40-60% calcified stenosis in the proximal segment after the first bend. There is vessel (right at the RV Marginal take-of) has really been 95-99% in-stent restenosis. There is also a hazy/hyperlucent area at the distal stent edge that has the appearance of possible  distal edge dissection flap. Beyond this the RCA is relatively normal with , with a widely patent distal stent prior to terminating in the Right Posterior Descending Artery (RPDA). There is a focal progress stenosis just beyond the stent.. The remainder the PDA is free of any significant disease is mild diffuse luminal irregularities POST-OPERATIVE DIAGNOSIS:  Severe native CAD as previously documented with essentially minimal native left coronary flow, severe 99% in-stent restenosis of mid RCA stent with distal edge flap. The distal RCA stent is widely patent with step down 30-40% stenosis. Normal flow down the PDA.  Successful cutting balloon  angioplasty followed by PCI of 99% in-stent restenosis of RCA and coverage of post stent dissection flap using Promus Premier 2.75 mm x 16 mm DES Widely patent SVG-OM1 with patent graft, widely patent SVG-OM 2 and LIMA-LAD  Severe systemic hypertension with moderately elevated LVEDP  Current Medications:  . allopurinol  100 mg Oral Daily  . aspirin  81 mg Oral QHS  . atorvastatin  40 mg Oral QHS  . carvedilol  25 mg Oral BID WC  . clopidogrel  75 mg Oral Daily  . insulin NPH Human  15 Units Subcutaneous BID AC & HS  . isosorbide mononitrate  60 mg Oral BID  . levothyroxine  125 mcg Oral QAC breakfast  . multivitamin  1 tablet Oral QHS  . sevelamer carbonate  2,400 mg Oral Q supper  . terazosin  2 mg Oral QHS      ASSESSMENT AND PLAN: Principal Problem:   Crescendo angina - s/p cath with cutting balloon angioplasty followed by PCI of 99% in-stent restenosis of RCA and coverage of post stent dissection flap. On ASA, BB, Plavix, statin, nitrates.  Active Problems:   Diabetes mellitus with neuropathy - continue home insulin regimen.    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 Spoke with Dr. Mercy Moore, patient had dialysis on 10/07, he is stable to be discharged home, and follow up with dialysis on 10/10, to get back on his normal schedule.  Signed, Rosaria Ferries , PA-C 7:20 AM 10/16/2013  I have examined the patient and reviewed assessment and plan and discussed with patient.  Agree with above as stated.  Right groin stable.  No hematoma. 1+ right PT pulse. Stressed importance of plavix.  Dialysis TBD.  Sharleen Szczesny S.

## 2013-10-16 NOTE — Discharge Instructions (Signed)
PLEASE REMEMBER TO BRING ALL OF YOUR MEDICATIONS TO EACH OF YOUR FOLLOW-UP OFFICE VISITS. ° °PLEASE ATTEND ALL SCHEDULED FOLLOW-UP APPOINTMENTS.  ° °Activity: Increase activity slowly as tolerated. You may shower, but no soaking baths (or swimming) for 1 week. No driving for 2 days. No lifting over 5 lbs for 1 week. No sexual activity for 1 week.  ° °You May Return to Work: in 1 week (if applicable) ° °Wound Care: You may wash cath site gently with soap and water. Keep cath site clean and dry. If you notice pain, swelling, bleeding or pus at your cath site, please call [phone]. ° ° ° °Cardiac Cath Site Care °Refer to this sheet in the next few weeks. These instructions provide you with information on caring for yourself after your procedure. Your caregiver may also give you more specific instructions. Your treatment has been planned according to current medical practices, but problems sometimes occur. Call your caregiver if you have any problems or questions after your procedure. °HOME CARE INSTRUCTIONS °· You may shower 24 hours after the procedure. Remove the bandage (dressing) and gently wash the site with plain soap and water. Gently pat the site dry.  °· Do not apply powder or lotion to the site.  °· Do not sit in a bathtub, swimming pool, or whirlpool for 5 to 7 days.  °· No bending, squatting, or lifting anything over 10 pounds (4.5 kg) as directed by your caregiver.  °· Inspect the site at least twice daily.  °· Do not drive home if you are discharged the same day of the procedure. Have someone else drive you.  °· You may drive 24 hours after the procedure unless otherwise instructed by your caregiver.  °What to expect: °· Any bruising will usually fade within 1 to 2 weeks.  °· Blood that collects in the tissue (hematoma) may be painful to the touch. It should usually decrease in size and tenderness within 1 to 2 weeks.  °SEEK IMMEDIATE MEDICAL CARE IF: °· You have unusual pain at the site or down the  affected limb.  °· You have redness, warmth, swelling, or pain at the site.  °· You have drainage (other than a small amount of blood on the dressing).  °· You have chills.  °· You have a fever or persistent symptoms for more than 72 hours.  °· You have a fever and your symptoms suddenly get worse.  °· Your leg becomes pale, cool, tingly, or numb.  °· You have heavy bleeding from the site. Hold pressure on the site.  °Document Released: 01/27/2010 Document Revised: 12/14/2010 Document Reviewed: 01/27/2010 °ExitCare® Patient Information ©2012 [employer]. ° °

## 2013-10-16 NOTE — Care Management Note (Addendum)
  Page 1 of 1   10/16/2013     11:35:21 AM CARE MANAGEMENT NOTE 10/16/2013  Patient:  [name] [name], [name] [name]   Account Number:  [id number]  Date Initiated:  10/16/2013  Documentation initiated by:  Wendle Kina  Subjective/Objective Assessment:   CP, unstable angina     Action/Plan:   CM to follow for disposition needs   Anticipated DC Date:  10/16/2013   Anticipated DC Plan:  HOME/SELF CARE         Choice offered to / List presented to:             Status of service:  Completed, signed off Medicare Important Message given?   (If response is "NO", the following Medicare IM given date fields will be blank) Date Medicare IM given:   Medicare IM given by:   Date Additional Medicare IM given:   Additional Medicare IM given by:    Discharge Disposition:  HOME/SELF CARE  Per UR Regulation:    If discussed at Long Length of Stay Meetings, dates discussed:    Comments:  Annaelle Kasel RN, BSN, MSHL, CCM  Nurse - Case Manager,  (Unit [phone]  10/16/2013 Med Review:  Plavix Dispo Plan:  Home - self care.

## 2013-10-16 NOTE — Discharge Summary (Signed)
CARDIOLOGY DISCHARGE SUMMARY   Patient ID: [name] [name] MRN: [id number] DOB/AGE: [birth year] 73 y.o.  Admit date: 10/15/2013 Discharge date: 10/16/2013  PCP: Annye Asa, MD Primary Cardiologist: Dr. 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 male with a history of CAD. He was seen in the office for crescendo angina and admitted 10/08 for cardiac catheterization with possible percutaneous intervention.  Procedure results are below. He had successful percutaneous intervention to the RCA, and tolerated the procedure well.  His dialysis schedule had been altered to allow him to come to the hospital for the procedure. Instead of Thursday dialysis, he had Wednesday dialysis. The renal team was contacted but they advised that unless the patient's volume status required it, he could safely wait until Saturday for dialysis and get back on his regular schedule.  On 10/09, he was seen by Dr. Beau Fanny and all data were reviewed. He has a history of anemia, and had a slight decrease in his hemoglobin and hematocrit post cath. However his platelets were within normal limits and he was having no bleeding issues. No further inpatient workup is indicated and he is considered stable for discharge, to follow up as an outpatient.  Labs:   Lab  Results  Component Value Date   WBC 9.1 10/16/2013   HGB 10.2* 10/16/2013   HCT 30.4* 10/16/2013   MCV 89.9 10/16/2013   PLT 200 10/16/2013     Recent Labs Lab 10/16/13 0410  NA 136*  K 3.9  CL 93*  CO2 28  BUN 36*  CREATININE 4.83*  CALCIUM 9.6  GLUCOSE 82    Recent Labs  10/15/13 0904  INR 1.02   Cardiac Cath:  PROCEDURES PERFORMED:  Left Heart Catheterization with Native Coronary and Graft Angiography via Right Common Femoral Artery  Complex/difficult PCI of mid RCA 99% in-stent restenosis of the stent placed in February of 2015 -  requiring use of a GuideLiner catheter to advance a cutting balloon into the mid vessel Coronary Anatomy:  Left Main: Very small caliber residual vessel with 50-70% stenosis. Extensive calcification. LAD: Very small caliber vessel that is occluded after first small diagonal/S/P branch  LIMA-LAD: Widely patent graft to a very small caliber distal LAD with diffuse luminal irregularities. Left Circumflex: 100% flush occlusion at ostium  SVG-OM2: Widely patent graft with patent stent; the perfused OM branch is small and diffusely diseased but with brisk flow.  SVG-OM3: Widely patent graft that also fills a very small caliber vessels OM branch with mild diffuse disease. RCA: Large caliber, dominant vessel with continued to have a 40-60% calcified stenosis in the proximal segment after the first bend. There is vessel (right at the RV Marginal take-of) has really been 95-99% in-stent restenosis. There is also a hazy/hyperlucent area at the distal stent edge that has the appearance of possible distal edge  dissection flap. Beyond this the RCA is relatively normal with , with a widely patent distal stent prior to terminating in the Right Posterior Descending Artery (RPDA). There is a focal progress stenosis just beyond the stent.. The remainder the PDA is free of any significant disease is mild diffuse luminal irregularities  POST-OPERATIVE DIAGNOSIS:  Severe  native CAD as previously documented with essentially minimal native left coronary flow, severe 99% in-stent restenosis of mid RCA stent with distal edge flap. The distal RCA stent is widely patent with step down 30-40% stenosis. Normal flow down the PDA.  Successful cutting balloon angioplasty followed by PCI of 99% in-stent restenosis of RCA and coverage of post stent dissection flap using Promus Premier 2.75 mm x 16 mm DES  Widely patent SVG-OM1 with patent graft, widely patent SVG-OM 2 and LIMA-LAD  Severe systemic hypertension with moderately elevated LVEDP  EKG: 16-Oct-2013 04:59:59   Sinus bradycardia Left bundle branch block Vent. rate 59 BPM PR interval 196 ms QRS duration 166 ms QT/QTc 516/510 ms P-R-T axes 33 -9 165  FOLLOW UP PLANS AND APPOINTMENTS Allergies  Allergen Reactions  . Shellfish Allergy     Causes gout flare-ups     Medication List         allopurinol 100 MG tablet  Commonly known as:  ZYLOPRIM  Take 100 mg by mouth daily.     aspirin 81 MG chewable tablet  Chew 81 mg by mouth at bedtime.     atorvastatin 40 MG tablet  Commonly known as:  LIPITOR  Take 40 mg by mouth at bedtime.     BESIVANCE 0.6 % Susp  Generic drug:  Besifloxacin HCl  Place 1 drop into both eyes See admin instructions. Use eye drops 4 times daily for 2 days following injection by Dr. Zigmund Daniel (once a month)     carvedilol 25 MG tablet  Commonly known as:  COREG  Take 25 mg by mouth 2 (two) times daily with a meal.     clopidogrel 75 MG tablet  Commonly known as:  PLAVIX  Take 1 tablet (75 mg total) by mouth daily.     glucose blood test strip  Commonly known as:  PRODIGY NO CODING BLOOD GLUC  Test as directed     insulin NPH Human 100 UNIT/ML injection  Commonly known as:  HUMULIN N,NOVOLIN N  Inject 15 Units into the skin 2 (two) times daily before a meal.     isosorbide mononitrate 60 MG 24 hr tablet  Commonly known as:  IMDUR  Take 60 mg by mouth 2 (two) times  daily.     levothyroxine 125 MCG tablet  Commonly known as:  SYNTHROID, LEVOTHROID  Take 125 mcg by mouth daily before breakfast.     lidocaine-prilocaine cream  Commonly known as:  EMLA  Apply 1 application topically daily as needed (prior to dialysis treatments).     loperamide 2 MG capsule  Commonly known as:  IMODIUM  Take 2 mg by mouth daily as needed for diarrhea or loose stools.     multivitamin Tabs tablet  Take 1 tablet by mouth daily.     nitroGLYCERIN 0.4 MG SL tablet  Commonly known as:  NITROSTAT  Place 1 tablet (0.4 mg total) under the tongue every 5 (five) minutes as needed for chest pain.     sevelamer carbonate 800 MG tablet  Commonly known as:  RENVELA  Take 2,400 mg by mouth daily.     terazosin 2 MG  capsule  Commonly known as:  HYTRIN  Take 2 mg by mouth at bedtime.        Discharge Instructions   Diet - low sodium heart healthy    Complete by:  As directed      Diet Carb Modified    Complete by:  As directed      Increase activity slowly    Complete by:  As directed           Follow-up Information   Follow up with Leonie Man, MD. (The office will call with a followup appointment.)    Specialty:  Cardiology   Contact information:   [address] [place] [address] [phone]       [place]  Time spent with patient to include physician time: 47 min Signed: Rosaria Ferries, PA-C 10/16/2013, 11:40 AM Co-Sign MD  I have examined the patient and reviewed assessment and plan and discussed with patient.  Agree with above as stated.  See progress note.  Dialysis tomorrow.  OK for d/c.  Jennamarie Goings S.

## 2013-10-31 DIAGNOSIS — N186 End stage renal disease: Secondary | ICD-10-CM | POA: Diagnosis not present

## 2013-10-31 DIAGNOSIS — N2581 Secondary hyperparathyroidism of renal origin: Secondary | ICD-10-CM | POA: Diagnosis not present

## 2013-11-06 ENCOUNTER — Other Ambulatory Visit: Payer: Self-pay | Admitting: General Practice

## 2013-11-06 MED ORDER — TERAZOSIN HCL 2 MG PO CAPS: 2.0000 mg | ORAL_CAPSULE | Freq: Every day | ORAL | Status: DC

## 2013-11-06 MED ORDER — LEVOTHYROXINE SODIUM 125 MCG PO TABS: 125.0000 ug | ORAL_TABLET | Freq: Every day | ORAL | Status: DC

## 2013-11-06 NOTE — Telephone Encounter (Signed)
Last OV 07-15-13 Terazosin last filled 08-10-13 #90 with 0

## 2013-11-07 DIAGNOSIS — N186 End stage renal disease: Secondary | ICD-10-CM | POA: Diagnosis not present

## 2013-11-07 DIAGNOSIS — Z992 Dependence on renal dialysis: Secondary | ICD-10-CM | POA: Diagnosis not present

## 2013-11-09 ENCOUNTER — Ambulatory Visit (INDEPENDENT_AMBULATORY_CARE_PROVIDER_SITE_OTHER): Payer: Medicare Other | Admitting: Cardiology

## 2013-11-09 ENCOUNTER — Encounter: Payer: Self-pay | Admitting: Cardiology

## 2013-11-09 VITALS — BP 102/60 | HR 62 | Ht 65.0 in | Wt 167.7 lb

## 2013-11-09 DIAGNOSIS — I209 Angina pectoris, unspecified: Secondary | ICD-10-CM

## 2013-11-09 DIAGNOSIS — I25708 Atherosclerosis of coronary artery bypass graft(s), unspecified, with other forms of angina pectoris: Secondary | ICD-10-CM | POA: Diagnosis not present

## 2013-11-09 DIAGNOSIS — E785 Hyperlipidemia, unspecified: Secondary | ICD-10-CM

## 2013-11-09 DIAGNOSIS — I200 Unstable angina: Secondary | ICD-10-CM

## 2013-11-09 DIAGNOSIS — I208 Other forms of angina pectoris: Secondary | ICD-10-CM | POA: Diagnosis not present

## 2013-11-09 DIAGNOSIS — I5042 Chronic combined systolic (congestive) and diastolic (congestive) heart failure: Secondary | ICD-10-CM | POA: Diagnosis not present

## 2013-11-09 NOTE — Patient Instructions (Addendum)
  NO CHANGE IN MEDICATION OTHER THAN  HOLD IMDUR MORNING DOSE ON TUESDAYS  HOLD IMDUR EVENING DOSE ON [place]   Your physician wants you to follow-up in 3 months Dr Ellyn Hack.- 30 mins appt.  You will receive a reminder letter in the mail two months in advance. If you don't receive a letter, please call our office to schedule the follow-up appointment.

## 2013-11-10 DIAGNOSIS — D631 Anemia in chronic kidney disease: Secondary | ICD-10-CM | POA: Diagnosis not present

## 2013-11-10 DIAGNOSIS — N2581 Secondary hyperparathyroidism of renal origin: Secondary | ICD-10-CM | POA: Diagnosis not present

## 2013-11-10 DIAGNOSIS — D509 Iron deficiency anemia, unspecified: Secondary | ICD-10-CM | POA: Diagnosis not present

## 2013-11-10 DIAGNOSIS — N186 End stage renal disease: Secondary | ICD-10-CM | POA: Diagnosis not present

## 2013-11-11 ENCOUNTER — Encounter: Payer: Self-pay | Admitting: Cardiology

## 2013-11-11 LAB — HM DIABETES EYE EXAM

## 2013-11-11 NOTE — Assessment & Plan Note (Signed)
On statin. His last labs demonstrated relatively good LDL and cholesterol levels but elevated triglycerides. We may need to consider using a fibrate or Fish Oil tablets for TGs.

## 2013-11-11 NOTE — Progress Notes (Signed)
PCP: Annye Asa, MD  Clinic Note: Chief Complaint  Patient presents with  . Follow-up    POST HOSP. , CHEST PAIN , SOB , NO EDEMA- DIAYLSIS- TU - THU - SAT    HPI: [name] [name] is a 73 y.o. male with a PMH below who presents today for his initial posthospital followup after urgent catheterization for crescendo angina noted by his nephrologist. On October 8, he underwent cardiac catheterization that revealed on severe in-stent restenosis in the recently placed stent to the mid RCA. There was also a filling defect distal to the stent. The entire stented area close to this defect was covered with a new Promus Premier DES stent..  Past Medical History  Diagnosis Date  . Diabetes mellitus   . Hyperlipidemia   . Hypertension   . Gout   . Hypothyroidism     On supplementation  . LBBB (left bundle branch block)     Chronic  . History of colon cancer 2003    colectomy for CA. no recurrence . never required  radiation or chem  . Anemia     Likely secondary to history of GI bleed  . Stroke 1998  . End stage renal disease on dialysis     Dialyzes at [facility]: Tues/Th/Sat - left upper cavity AV fistula brachiocephalic  . CAD in native artery; and the grafts 1992, 1997, 2002, 2006, 2012    CABG 1992 and redo CABG 2006  . CAD (coronary artery disease) of bypass graft 2006, 2012    In 2006: Occluded SVG-OM noted (SVG-diagonal was previously occluded); 2012: Severe lesion in redo SVG-OM1 --> BMS PCI   . CAD S/P percutaneous coronary angioplasty October 2012; Feb, Apr & Oct 2015;     10/2010: Non-STEMI: PCI to SVG-OM1 Integrity BMS 3.5 mm x 15 mm (3.85 mm);; 02/2013: PCI to mid RCA Xience Apling DES 2.75 mm x 15 mm (3.1 mm) ;; 04/2013 : dRCA  Integrity Resolute DES   2.25 mm x 20m (2.5 mm);; 10/'15: PCI to mRCA ISR w/ post stent lesion - Promus P 2.75 m x 16 Atm (3.25 mm)  . History of nuclear stress test Sept 2014    EF 39% (down from) 45%, no ischemia; but now the inferobasal  perfusion defect is determined to be likley prior infarction (as opposed to diaphragmatic attenuation)  . Echocardiogram findings abnormal, without diagnosis [id number] Oct 2014    EF 50-55%, anteroseptal hypokinesis. Mild to moderate concentric LVH. Mild MR, aortic sclerosis. Mild atrial dilatation bilaterally.; Oct - mild LVH, EF ~50-55%, incoordinate septal WM, - with no other notable WMA  . History of Non-ST elevated myocardial infarction (non-STEMI) 1992, 2006, 2012; 02/2013  . Peptic ulcer disease  2004  . BPH (benign prostatic hyperplasia)   . Chronic low back pain    Interval History: times a day in general feeling better. He has not had to use nearly as much nitroglycerin as he had before -- even going a couple days without using any nitroglycerin.. This weekend he used 4 doses of nitroglycerin on Saturday and one on Sunday and has not used any yet today.  All in all, he is feeling better than prior to his PCI. At that time he was unable to walk across the room. If he exerts himself more than usual he will note some angina, but he is able to tolerate than some mild walking.  He is not having the dyspnea that he is having. The PND orthopnea. Only  gets edema on the long weekend when he skips a day of dialysis.  He denies any rapid or irregular heartbeats on/arrhythmias. He does have occasional palpitations but nothing significant. No TIA or amaurosis fugax symptoms, syncope/near syncope. He still gets a little bit dizzy after dialysis and has labile blood pressures. They are currently adjusting his dry weight because he has lost weight from his baseline. His first and had been about a month ago. He is not eating as much as he use to mostly because he doesn't have any appetite. He denies any bleeding complications related to his antiplatelet agents. He has not had trouble with claudication.  ROS: A comprehensive was performed. Review of Systems  Constitutional: Positive for weight loss. Negative for  fever, chills and malaise/fatigue.  HENT: Negative for nosebleeds.   Respiratory: Negative for cough, shortness of breath and wheezing.   Cardiovascular:       Per history of present illness  Gastrointestinal: Negative for blood in stool and melena.  Genitourinary: Negative.   Musculoskeletal: Positive for joint pain. Negative for falls.  Neurological: Positive for dizziness. Negative for sensory change, speech change, focal weakness, seizures and loss of consciousness.  Psychiatric/Behavioral: Negative for depression. The patient is not nervous/anxious.   All other systems reviewed and are negative.   Current Outpatient Prescriptions on File Prior to Visit  Medication Sig Dispense Refill  . allopurinol (ZYLOPRIM) 100 MG tablet Take 100 mg by mouth daily.    Marland Kitchen aspirin 81 MG chewable tablet Chew 81 mg by mouth at bedtime.    Marland Kitchen atorvastatin (LIPITOR) 40 MG tablet Take 40 mg by mouth at bedtime.    Marland Kitchen Besifloxacin HCl (BESIVANCE) 0.6 % SUSP Place 1 drop into both eyes See admin instructions. Use eye drops 4 times daily for 2 days following injection by Dr. Zigmund Daniel (once a month)    . carvedilol (COREG) 25 MG tablet Take 25 mg by mouth 2 (two) times daily with a meal.    . clopidogrel (PLAVIX) 75 MG tablet Take 1 tablet (75 mg total) by mouth daily. 30 tablet 11  . glucose blood (PRODIGY NO CODING BLOOD GLUC) test strip Test as directed 100 each 6  . insulin NPH Human (HUMULIN N,NOVOLIN N) 100 UNIT/ML injection Inject 15 Units into the skin 2 (two) times daily before a meal. As needed    . isosorbide mononitrate (IMDUR) 60 MG 24 hr tablet Take 60 mg by mouth 2 (two) times daily.    Marland Kitchen levothyroxine (SYNTHROID, LEVOTHROID) 125 MCG tablet Take 1 tablet (125 mcg total) by mouth daily before breakfast. 90 tablet 1  . lidocaine-prilocaine (EMLA) cream Apply 1 application topically daily as needed (prior to dialysis treatments).     . loperamide (IMODIUM) 2 MG capsule Take 2 mg by mouth daily as  needed for diarrhea or loose stools.     . multivitamin (RENA-VIT) TABS tablet Take 1 tablet by mouth daily. 90 tablet 3  . nitroGLYCERIN (NITROSTAT) 0.4 MG SL tablet Place 1 tablet (0.4 mg total) under the tongue every 5 (five) minutes as needed for chest pain. 100 tablet 4  . sevelamer carbonate (RENVELA) 800 MG tablet Take 2,400 mg by mouth daily.     Marland Kitchen terazosin (HYTRIN) 2 MG capsule Take 1 capsule (2 mg total) by mouth at bedtime. 90 capsule 3   No current facility-administered medications on file prior to visit.    ALLERGIES REVIEWED IN EPIC -- no change SOCIAL AND FAMILY HISTORY REVIEWED IN EPIC --  no change  Wt Readings from Last 3 Encounters:  11/09/13 167 lb 11.2 oz (76.068 kg)  10/16/13 168 lb 10.4 oz (76.5 kg)  09/28/13 172 lb 3.2 oz (78.109 kg)    PHYSICAL EXAM BP 102/60 mmHg  Pulse 62  Ht _0  (1.651 m)  Wt 167 lb 11.2 oz (76.068 kg)  BMI 27.91 kg/m2 General appearance: alert, cooperative, appears stated age, no distress; and Borderline obese. Chronically ill-appearing, pale, well groomed.Marland Kitchen answers questions appropriately.  Neck: no adenopathy, no carotid bruit, mild JVD; Supple, symmetrical, trachea midline  Lungs: clear to auscultation bilaterally, normal percussion bilaterally and Nonlabored, good air movement  Heart: normal apical impulse, RRR, S1, S2 normal, S4 present, SEM 2/6, crescendo, decrescendo and harsh at 2nd right intercostal space, no click and no rub  Abdomen: soft, non-tender; bowel sounds normal; no masses, no organomegaly and Mild truncal obesity  Extremities: extremities normal, atraumatic, no cyanosis or edema and fistula in the left upper arm with good thrill  Pulses: 2+ and symmetric  Neurologic: Grossly normal   Adult ECG Report  Rate: 62 ;  Rhythm: normal sinus rhythm and LBBB  Narrative Interpretation: stable EKG  Recent Labs:    Lab Results  Component Value Date   CHOL 88 07/15/2013   HDL 25.60* 07/15/2013   LDLCALC 5  07/15/2013   LDLDIRECT 33.5 12/22/2012   TRIG 287.0* 07/15/2013   CHOLHDL 3 07/15/2013    ASSESSMENT / PLAN: Unstable angina - crescendo class 3-4 angina now at rest He is pretty good at knowing when his symptoms get to that. This last time he had a definite severe in-stent restenosis leading to his symptoms. Treated with a new stent, now back to his baseline stable angina.  CAD (coronary artery disease) of bypass graft - PCI to SVG-OM with BMS; PCI-mid RCA with a Xience DES Graft and native vessel disease. On dual therapy with aspirin and Plavix. He did not have in stent thrombosis, secondary to suspected he is a nonresponder, however I will need to consider checking a P2Y12 Inhibitor assay. He is on a statin and Imdur for his baseline stable angina. He is on a beta blocker but no ACE inhibitor/ARB because of his renal insufficiency.  Hyperlipidemia with target LDL less than 70 On statin. His last labs demonstrated relatively good LDL and cholesterol levels but elevated triglycerides. We may need to consider using a fibrate or Fish Oil tablets for TGs.  Angina, class II - stable Now he is in his symptoms are stable again, we need to ensure that he has a nitrate free time frame. He is taking Imdur twice a day and was probably best to have sometimes when he is not taking a dose. Asked him to hold the morning dose on Tuesdays and the evening dose on Thursday -- his post dialysis days during which she is usually less likely angina and has lower pressures.  Chronic combined systolic and diastolic congestive heart failure, NYHA class 2 Definitely contributes to his stable angina. Mostly diastolic dysfunction. We did a dry weight he is having less of this problem. He notices this more often over the weekend when he goes 2 days without dialysis. On excellent medications mandible. I would like him to use much more her chores at reducing agents because his blood pressure is usually quite low,  especially on dialysis days    No orders of the defined types were placed in this encounter.   Meds ordered this encounter  Medications  .  trimethoprim-polymyxin b (POLYTRIM) ophthalmic solution    Sig: Place 1 drop into both eyes as needed.      Followup: 3 months   Josehua Hammar W, M.D., M.S. Interventional Cardiologist   Pager # [phone]

## 2013-11-11 NOTE — Assessment & Plan Note (Addendum)
Definitely contributes to his stable angina. Mostly diastolic dysfunction. We did a dry weight he is having less of this problem. He notices this more often over the weekend when he goes 2 days without dialysis. On excellent medications mandible. I would like him to use much more her chores at reducing agents because his blood pressure is usually quite low, especially on dialysis days

## 2013-11-11 NOTE — Assessment & Plan Note (Signed)
Graft and native vessel disease. On dual therapy with aspirin and Plavix. He did not have in stent thrombosis, secondary to suspected he is a nonresponder, however I will need to consider checking a P2Y12 Inhibitor assay. He is on a statin and Imdur for his baseline stable angina. He is on a beta blocker but no ACE inhibitor/ARB because of his renal insufficiency.

## 2013-11-11 NOTE — Assessment & Plan Note (Signed)
He is pretty good at knowing when his symptoms get to that. This last time he had a definite severe in-stent restenosis leading to his symptoms. Treated with a new stent, now back to his baseline stable angina.

## 2013-11-11 NOTE — Assessment & Plan Note (Signed)
Now he is in his symptoms are stable again, we need to ensure that he has a nitrate free time frame. He is taking Imdur twice a day and was probably best to have sometimes when he is not taking a dose. Asked him to hold the morning dose on Tuesdays and the evening dose on Thursday -- his post dialysis days during which she is usually less likely angina and has lower pressures.

## 2013-11-13 ENCOUNTER — Encounter (INDEPENDENT_AMBULATORY_CARE_PROVIDER_SITE_OTHER): Payer: Medicare Other | Admitting: Ophthalmology

## 2013-11-13 DIAGNOSIS — H43813 Vitreous degeneration, bilateral: Secondary | ICD-10-CM

## 2013-11-13 DIAGNOSIS — H35033 Hypertensive retinopathy, bilateral: Secondary | ICD-10-CM

## 2013-11-13 DIAGNOSIS — E11321 Type 2 diabetes mellitus with mild nonproliferative diabetic retinopathy with macular edema: Secondary | ICD-10-CM | POA: Diagnosis not present

## 2013-11-13 DIAGNOSIS — I10 Essential (primary) hypertension: Secondary | ICD-10-CM | POA: Diagnosis not present

## 2013-11-13 DIAGNOSIS — E11311 Type 2 diabetes mellitus with unspecified diabetic retinopathy with macular edema: Secondary | ICD-10-CM

## 2013-11-18 ENCOUNTER — Ambulatory Visit (INDEPENDENT_AMBULATORY_CARE_PROVIDER_SITE_OTHER): Payer: Medicare Other | Admitting: Family Medicine

## 2013-11-18 ENCOUNTER — Encounter: Payer: Self-pay | Admitting: Family Medicine

## 2013-11-18 VITALS — BP 160/60 | HR 59 | Temp 98.2°F | Resp 16 | Wt 164.5 lb

## 2013-11-18 DIAGNOSIS — E785 Hyperlipidemia, unspecified: Secondary | ICD-10-CM

## 2013-11-18 DIAGNOSIS — I208 Other forms of angina pectoris: Secondary | ICD-10-CM

## 2013-11-18 DIAGNOSIS — I25709 Atherosclerosis of coronary artery bypass graft(s), unspecified, with unspecified angina pectoris: Secondary | ICD-10-CM

## 2013-11-18 DIAGNOSIS — I10 Essential (primary) hypertension: Secondary | ICD-10-CM

## 2013-11-18 DIAGNOSIS — E1140 Type 2 diabetes mellitus with diabetic neuropathy, unspecified: Secondary | ICD-10-CM

## 2013-11-18 LAB — LIPID PANEL
CHOLESTEROL: 71 mg/dL (ref 0–200)
HDL: 22.4 mg/dL — AB (ref >39.00)
LDL CALC: 17 mg/dL (ref 0–99)
NonHDL: 48.6
TRIGLYCERIDES: 156 mg/dL — AB (ref 0.0–149.0)
Total CHOL/HDL Ratio: 3
VLDL: 31.2 mg/dL (ref 0.0–40.0)

## 2013-11-18 LAB — HEPATIC FUNCTION PANEL
ALT: 16 U/L (ref 0–53)
AST: 17 U/L (ref 0–37)
Albumin: 3.8 g/dL (ref 3.5–5.2)
Alkaline Phosphatase: 81 U/L (ref 39–117)
BILIRUBIN DIRECT: 0 mg/dL (ref 0.0–0.3)
Total Bilirubin: 0.6 mg/dL (ref 0.2–1.2)
Total Protein: 7.3 g/dL (ref 6.0–8.3)

## 2013-11-18 LAB — BASIC METABOLIC PANEL
BUN: 25 mg/dL — ABNORMAL HIGH (ref 6–23)
CHLORIDE: 102 meq/L (ref 96–112)
CO2: 27 mEq/L (ref 19–32)
CREATININE: 4 mg/dL — AB (ref 0.4–1.5)
Calcium: 9.9 mg/dL (ref 8.4–10.5)
GFR: 15.61 mL/min — ABNORMAL LOW (ref >60.00)
Glucose, Bld: 141 mg/dL — ABNORMAL HIGH (ref 70–99)
POTASSIUM: 3.9 meq/L (ref 3.5–5.1)
Sodium: 146 mEq/L — ABNORMAL HIGH (ref 135–145)

## 2013-11-18 LAB — HEMOGLOBIN A1C: HEMOGLOBIN A1C: 5.5 % (ref 4.6–6.5)

## 2013-11-18 MED ORDER — NITROGLYCERIN 0.4 MG SL SUBL: 0.4000 mg | SUBLINGUAL_TABLET | SUBLINGUAL | Status: DC | PRN

## 2013-11-18 NOTE — Assessment & Plan Note (Signed)
Chronic problem.  Tolerating statin w/o difficulty.  Unable to afford fenofibrate.  Check labs.  Adjust meds prn.

## 2013-11-18 NOTE — Patient Instructions (Signed)
Follow up in 3-4 months to recheck diabetes We'll notify you of your lab results and make any changes if needed Please have dialysis send me a copy of your immunizations so I can update our computer chart Call with any questions or concerns Happy Holidays!!!

## 2013-11-18 NOTE — Assessment & Plan Note (Signed)
BP is elevated today but pt has not yet taken meds.  BP also tends to drop low during HD.  At this time, pt prefers BP to run slightly high to avoid passing out after dialysis.  I agree.  No med changes at this time.  Will follow.

## 2013-11-18 NOTE — Assessment & Plan Note (Signed)
Chronic problem.  Pt remains on SSI.  Not requiring much insulin due to considerable weight loss since starting HD.  UTD on eye exam.  + neuropathy.  Pt not interested in gabapentin at this time but 'it's getting close'.  Instructed him to let me know when he was willing to start meds.  Check labs.  Adjust meds prn

## 2013-11-18 NOTE — Progress Notes (Signed)
   Subjective:    Patient ID: [name] [name], male    DOB: May 12, 1940, 73 y.o.   MRN: [id number]  HPI DM- chronic problem, on HD- no need for ACE/ARB.  Using SSI.  If CBG >130 will take 10 units NPH, if readings are higher will increase dose up to 20 units. CBGs 100-160.  UTD on eye date- 11/13/13 w/ Dr Zigmund Daniel.  Pt will have some dizziness but attributes this to BP.  HTN- chronic problem, BP is elevated today but pt hasn't taken meds yet today.  HD causes sudden drops in BP.  Hyperlipidemia- chronic problem, on Lipitor but not fenofibrate due to cost.  Denies abd pain, N/V.  Chronic myalgias.   Review of Systems For ROS see HPI     Objective:   Physical Exam  Constitutional: He is oriented to person, place, and time. He appears well-developed and well-nourished. No distress.  HENT:  Head: Normocephalic and atraumatic.  Eyes: Conjunctivae and EOM are normal. Pupils are equal, round, and reactive to light.  Neck: Normal range of motion. Neck supple. No thyromegaly present.  Cardiovascular: Normal rate, regular rhythm, normal heart sounds and intact distal pulses.   No murmur heard. AV fistula in L upper arm  Pulmonary/Chest: Effort normal and breath sounds normal. No respiratory distress.  Abdominal: Soft. Bowel sounds are normal. He exhibits no distension.  Musculoskeletal: He exhibits no edema.  Lymphadenopathy:    He has no cervical adenopathy.  Neurological: He is alert and oriented to person, place, and time. No cranial nerve deficit.  Skin: Skin is warm and dry.  Psychiatric: He has a normal mood and affect. His behavior is normal.  Vitals reviewed.         Assessment & Plan:

## 2013-11-18 NOTE — Progress Notes (Signed)
Pre visit review using our clinic review tool, if applicable. No additional management support is needed unless otherwise documented below in the visit note. 

## 2013-11-21 DIAGNOSIS — N2581 Secondary hyperparathyroidism of renal origin: Secondary | ICD-10-CM | POA: Diagnosis not present

## 2013-11-21 DIAGNOSIS — N186 End stage renal disease: Secondary | ICD-10-CM | POA: Diagnosis not present

## 2013-12-07 DIAGNOSIS — Z992 Dependence on renal dialysis: Secondary | ICD-10-CM | POA: Diagnosis not present

## 2013-12-07 DIAGNOSIS — N186 End stage renal disease: Secondary | ICD-10-CM | POA: Diagnosis not present

## 2013-12-08 DIAGNOSIS — N186 End stage renal disease: Secondary | ICD-10-CM | POA: Diagnosis not present

## 2013-12-08 DIAGNOSIS — N2581 Secondary hyperparathyroidism of renal origin: Secondary | ICD-10-CM | POA: Diagnosis not present

## 2013-12-08 DIAGNOSIS — D509 Iron deficiency anemia, unspecified: Secondary | ICD-10-CM | POA: Diagnosis not present

## 2013-12-08 DIAGNOSIS — Z23 Encounter for immunization: Secondary | ICD-10-CM | POA: Diagnosis not present

## 2013-12-08 DIAGNOSIS — D631 Anemia in chronic kidney disease: Secondary | ICD-10-CM | POA: Diagnosis not present

## 2013-12-17 ENCOUNTER — Encounter (HOSPITAL_COMMUNITY): Payer: Self-pay | Admitting: Cardiovascular Disease

## 2014-01-07 DIAGNOSIS — Z992 Dependence on renal dialysis: Secondary | ICD-10-CM | POA: Diagnosis not present

## 2014-01-07 DIAGNOSIS — N186 End stage renal disease: Secondary | ICD-10-CM | POA: Diagnosis not present

## 2014-01-10 DIAGNOSIS — N186 End stage renal disease: Secondary | ICD-10-CM | POA: Diagnosis not present

## 2014-01-10 DIAGNOSIS — D631 Anemia in chronic kidney disease: Secondary | ICD-10-CM | POA: Diagnosis not present

## 2014-01-10 DIAGNOSIS — N2581 Secondary hyperparathyroidism of renal origin: Secondary | ICD-10-CM | POA: Diagnosis not present

## 2014-01-13 DIAGNOSIS — N186 End stage renal disease: Secondary | ICD-10-CM | POA: Diagnosis not present

## 2014-01-13 DIAGNOSIS — I871 Compression of vein: Secondary | ICD-10-CM | POA: Diagnosis not present

## 2014-01-13 DIAGNOSIS — T82858D Stenosis of vascular prosthetic devices, implants and grafts, subsequent encounter: Secondary | ICD-10-CM | POA: Diagnosis not present

## 2014-01-13 DIAGNOSIS — Z992 Dependence on renal dialysis: Secondary | ICD-10-CM | POA: Diagnosis not present

## 2014-01-15 ENCOUNTER — Encounter (INDEPENDENT_AMBULATORY_CARE_PROVIDER_SITE_OTHER): Payer: Medicare Other | Admitting: Ophthalmology

## 2014-01-15 DIAGNOSIS — E11331 Type 2 diabetes mellitus with moderate nonproliferative diabetic retinopathy with macular edema: Secondary | ICD-10-CM

## 2014-01-15 DIAGNOSIS — E11311 Type 2 diabetes mellitus with unspecified diabetic retinopathy with macular edema: Secondary | ICD-10-CM | POA: Diagnosis not present

## 2014-01-15 DIAGNOSIS — H43813 Vitreous degeneration, bilateral: Secondary | ICD-10-CM | POA: Diagnosis not present

## 2014-01-15 DIAGNOSIS — H35033 Hypertensive retinopathy, bilateral: Secondary | ICD-10-CM

## 2014-01-15 DIAGNOSIS — I10 Essential (primary) hypertension: Secondary | ICD-10-CM | POA: Diagnosis not present

## 2014-02-07 DIAGNOSIS — N186 End stage renal disease: Secondary | ICD-10-CM | POA: Diagnosis not present

## 2014-02-07 DIAGNOSIS — Z992 Dependence on renal dialysis: Secondary | ICD-10-CM | POA: Diagnosis not present

## 2014-02-08 ENCOUNTER — Encounter (HOSPITAL_COMMUNITY): Payer: Self-pay | Admitting: Emergency Medicine

## 2014-02-08 ENCOUNTER — Emergency Department (HOSPITAL_COMMUNITY): Payer: Medicare Other

## 2014-02-08 ENCOUNTER — Inpatient Hospital Stay (HOSPITAL_COMMUNITY)
Admission: EM | Admit: 2014-02-08 | Discharge: 2014-02-10 | DRG: 250 | Disposition: A | Discharge status: Home / Self Care | Payer: Medicare Other | Attending: Cardiology | Admitting: Cardiology

## 2014-02-08 ENCOUNTER — Telehealth: Payer: Self-pay | Admitting: Cardiology

## 2014-02-08 DIAGNOSIS — N2581 Secondary hyperparathyroidism of renal origin: Secondary | ICD-10-CM | POA: Diagnosis present

## 2014-02-08 DIAGNOSIS — Y832 Surgical operation with anastomosis, bypass or graft as the cause of abnormal reaction of the patient, or of later complication, without mention of misadventure at the time of the procedure: Secondary | ICD-10-CM | POA: Diagnosis present

## 2014-02-08 DIAGNOSIS — I120 Hypertensive chronic kidney disease with stage 5 chronic kidney disease or end stage renal disease: Secondary | ICD-10-CM | POA: Diagnosis present

## 2014-02-08 DIAGNOSIS — I25119 Atherosclerotic heart disease of native coronary artery with unspecified angina pectoris: Secondary | ICD-10-CM | POA: Insufficient documentation

## 2014-02-08 DIAGNOSIS — I25700 Atherosclerosis of coronary artery bypass graft(s), unspecified, with unstable angina pectoris: Secondary | ICD-10-CM

## 2014-02-08 DIAGNOSIS — D649 Anemia, unspecified: Secondary | ICD-10-CM | POA: Diagnosis present

## 2014-02-08 DIAGNOSIS — E785 Hyperlipidemia, unspecified: Secondary | ICD-10-CM | POA: Diagnosis present

## 2014-02-08 DIAGNOSIS — Z794 Long term (current) use of insulin: Secondary | ICD-10-CM | POA: Diagnosis not present

## 2014-02-08 DIAGNOSIS — I25110 Atherosclerotic heart disease of native coronary artery with unstable angina pectoris: Secondary | ICD-10-CM | POA: Diagnosis present

## 2014-02-08 DIAGNOSIS — N186 End stage renal disease: Secondary | ICD-10-CM

## 2014-02-08 DIAGNOSIS — I252 Old myocardial infarction: Secondary | ICD-10-CM

## 2014-02-08 DIAGNOSIS — Z8673 Personal history of transient ischemic attack (TIA), and cerebral infarction without residual deficits: Secondary | ICD-10-CM | POA: Diagnosis not present

## 2014-02-08 DIAGNOSIS — Z992 Dependence on renal dialysis: Secondary | ICD-10-CM

## 2014-02-08 DIAGNOSIS — E119 Type 2 diabetes mellitus without complications: Secondary | ICD-10-CM | POA: Diagnosis present

## 2014-02-08 DIAGNOSIS — Z8249 Family history of ischemic heart disease and other diseases of the circulatory system: Secondary | ICD-10-CM | POA: Diagnosis not present

## 2014-02-08 DIAGNOSIS — T82857A Stenosis of cardiac prosthetic devices, implants and grafts, initial encounter: Secondary | ICD-10-CM | POA: Diagnosis not present

## 2014-02-08 DIAGNOSIS — I447 Left bundle-branch block, unspecified: Secondary | ICD-10-CM | POA: Diagnosis present

## 2014-02-08 DIAGNOSIS — Z91013 Allergy to seafood: Secondary | ICD-10-CM

## 2014-02-08 DIAGNOSIS — I25810 Atherosclerosis of coronary artery bypass graft(s) without angina pectoris: Secondary | ICD-10-CM | POA: Diagnosis present

## 2014-02-08 DIAGNOSIS — Z7902 Long term (current) use of antithrombotics/antiplatelets: Secondary | ICD-10-CM | POA: Diagnosis not present

## 2014-02-08 DIAGNOSIS — R079 Chest pain, unspecified: Secondary | ICD-10-CM | POA: Diagnosis not present

## 2014-02-08 DIAGNOSIS — Z7982 Long term (current) use of aspirin: Secondary | ICD-10-CM

## 2014-02-08 DIAGNOSIS — I200 Unstable angina: Secondary | ICD-10-CM

## 2014-02-08 DIAGNOSIS — I214 Non-ST elevation (NSTEMI) myocardial infarction: Secondary | ICD-10-CM | POA: Diagnosis present

## 2014-02-08 DIAGNOSIS — Z833 Family history of diabetes mellitus: Secondary | ICD-10-CM

## 2014-02-08 DIAGNOSIS — R072 Precordial pain: Secondary | ICD-10-CM | POA: Diagnosis not present

## 2014-02-08 DIAGNOSIS — D631 Anemia in chronic kidney disease: Secondary | ICD-10-CM | POA: Diagnosis not present

## 2014-02-08 LAB — CBC WITH DIFFERENTIAL/PLATELET
Basophils Absolute: 0 10*3/uL (ref 0.0–0.1)
Basophils Relative: 0 % (ref 0–1)
EOS PCT: 3 % (ref 0–5)
Eosinophils Absolute: 0.2 10*3/uL (ref 0.0–0.7)
HCT: 32.8 % — ABNORMAL LOW (ref 39.0–52.0)
Hemoglobin: 10.9 g/dL — ABNORMAL LOW (ref 13.0–17.0)
LYMPHS ABS: 0.8 10*3/uL (ref 0.7–4.0)
Lymphocytes Relative: 9 % — ABNORMAL LOW (ref 12–46)
MCH: 30.2 pg (ref 26.0–34.0)
MCHC: 33.2 g/dL (ref 30.0–36.0)
MCV: 90.9 fL (ref 78.0–100.0)
MONOS PCT: 7 % (ref 3–12)
Monocytes Absolute: 0.6 10*3/uL (ref 0.1–1.0)
NEUTROS ABS: 6.8 10*3/uL (ref 1.7–7.7)
NEUTROS PCT: 81 % — AB (ref 43–77)
PLATELETS: 187 10*3/uL (ref 150–400)
RBC: 3.61 MIL/uL — AB (ref 4.22–5.81)
RDW: 14.7 % (ref 11.5–15.5)
WBC: 8.4 10*3/uL (ref 4.0–10.5)

## 2014-02-08 LAB — I-STAT CHEM 8, ED
BUN: 34 mg/dL — AB (ref 6–23)
CREATININE: 4.8 mg/dL — AB (ref 0.50–1.35)
Calcium, Ion: 1.14 mmol/L (ref 1.13–1.30)
Chloride: 96 mmol/L (ref 96–112)
Glucose, Bld: 157 mg/dL — ABNORMAL HIGH (ref 70–99)
HEMATOCRIT: 34 % — AB (ref 39.0–52.0)
HEMOGLOBIN: 11.6 g/dL — AB (ref 13.0–17.0)
POTASSIUM: 4.2 mmol/L (ref 3.5–5.1)
SODIUM: 137 mmol/L (ref 135–145)
TCO2: 25 mmol/L (ref 0–100)

## 2014-02-08 LAB — COMPREHENSIVE METABOLIC PANEL
ALK PHOS: 79 U/L (ref 39–117)
ALT: 17 U/L (ref 0–53)
ANION GAP: 12 (ref 5–15)
AST: 21 U/L (ref 0–37)
Albumin: 3.9 g/dL (ref 3.5–5.2)
BILIRUBIN TOTAL: 0.7 mg/dL (ref 0.3–1.2)
BUN: 33 mg/dL — ABNORMAL HIGH (ref 6–23)
CALCIUM: 9.7 mg/dL (ref 8.4–10.5)
CO2: 29 mmol/L (ref 19–32)
CREATININE: 4.94 mg/dL — AB (ref 0.50–1.35)
Chloride: 97 mmol/L (ref 96–112)
GFR calc Af Amer: 12 mL/min — ABNORMAL LOW (ref >90)
GFR calc non Af Amer: 11 mL/min — ABNORMAL LOW (ref >90)
GLUCOSE: 159 mg/dL — AB (ref 70–99)
Potassium: 4 mmol/L (ref 3.5–5.1)
SODIUM: 138 mmol/L (ref 135–145)
Total Protein: 6.6 g/dL (ref 6.0–8.3)

## 2014-02-08 LAB — TROPONIN I
TROPONIN I: 0.16 ng/mL — AB (ref <0.031)
Troponin I: 0.06 ng/mL — ABNORMAL HIGH (ref <0.031)
Troponin I: 0.22 ng/mL — ABNORMAL HIGH (ref <0.031)

## 2014-02-08 LAB — PLATELET INHIBITION P2Y12: Platelet Function  P2Y12: 264 [PRU] (ref 194–418)

## 2014-02-08 LAB — MRSA PCR SCREENING: MRSA by PCR: NEGATIVE

## 2014-02-08 LAB — GLUCOSE, CAPILLARY: Glucose-Capillary: 157 mg/dL — ABNORMAL HIGH (ref 70–99)

## 2014-02-08 LAB — HEPARIN LEVEL (UNFRACTIONATED): Heparin Unfractionated: 0.34 IU/mL (ref 0.30–0.70)

## 2014-02-08 MED ORDER — ASPIRIN 81 MG PO CHEW
324.0000 mg | CHEWABLE_TABLET | Freq: Once | ORAL | Status: AC
  Administered 2014-02-08: 324 mg via ORAL
  Filled 2014-02-08: qty 4

## 2014-02-08 MED ORDER — CLOPIDOGREL BISULFATE 75 MG PO TABS
75.0000 mg | ORAL_TABLET | Freq: Every day | ORAL | Status: DC
  Filled 2014-02-08: qty 1

## 2014-02-08 MED ORDER — ONDANSETRON HCL 4 MG/2ML IJ SOLN: 4.0000 mg | Freq: Four times a day (QID) | INTRAMUSCULAR | Status: DC | PRN

## 2014-02-08 MED ORDER — RENA-VITE PO TABS
1.0000 | ORAL_TABLET | Freq: Every day | ORAL | Status: DC
  Administered 2014-02-08: 1 via ORAL
  Filled 2014-02-08 (×4): qty 1

## 2014-02-08 MED ORDER — ALLOPURINOL 100 MG PO TABS
100.0000 mg | ORAL_TABLET | Freq: Every day | ORAL | Status: DC
  Filled 2014-02-08 (×2): qty 1

## 2014-02-08 MED ORDER — ATORVASTATIN CALCIUM 40 MG PO TABS
40.0000 mg | ORAL_TABLET | Freq: Every day | ORAL | Status: DC
  Administered 2014-02-08 – 2014-02-09 (×2): 40 mg via ORAL
  Filled 2014-02-08 (×4): qty 1

## 2014-02-08 MED ORDER — TERAZOSIN HCL 2 MG PO CAPS
2.0000 mg | ORAL_CAPSULE | Freq: Every day | ORAL | Status: DC
  Administered 2014-02-08 – 2014-02-09 (×2): 2 mg via ORAL
  Filled 2014-02-08 (×4): qty 1

## 2014-02-08 MED ORDER — SODIUM CHLORIDE 0.9 % IV SOLN: 250.0000 mL | INTRAVENOUS | Status: DC | PRN

## 2014-02-08 MED ORDER — NITROGLYCERIN 0.4 MG SL SUBL: 0.4000 mg | SUBLINGUAL_TABLET | SUBLINGUAL | Status: DC | PRN

## 2014-02-08 MED ORDER — HEPARIN (PORCINE) IN NACL 100-0.45 UNIT/ML-% IJ SOLN
1000.0000 [IU]/h | INTRAMUSCULAR | Status: DC
Start: 2014-02-08 — End: 2014-02-09
  Administered 2014-02-08: 950 [IU]/h via INTRAVENOUS
  Filled 2014-02-08 (×2): qty 250

## 2014-02-08 MED ORDER — ISOSORBIDE MONONITRATE ER 60 MG PO TB24
60.0000 mg | ORAL_TABLET | Freq: Two times a day (BID) | ORAL | Status: DC
  Administered 2014-02-09: 60 mg via ORAL
  Filled 2014-02-08 (×5): qty 1

## 2014-02-08 MED ORDER — LEVOTHYROXINE SODIUM 125 MCG PO TABS
125.0000 ug | ORAL_TABLET | Freq: Every day | ORAL | Status: DC
  Administered 2014-02-09 – 2014-02-10 (×2): 125 ug via ORAL
  Filled 2014-02-08 (×4): qty 1

## 2014-02-08 MED ORDER — ACETAMINOPHEN 325 MG PO TABS: 650.0000 mg | ORAL_TABLET | ORAL | Status: DC | PRN

## 2014-02-08 MED ORDER — NITROGLYCERIN 0.4 MG SL SUBL
0.4000 mg | SUBLINGUAL_TABLET | SUBLINGUAL | Status: AC | PRN
  Administered 2014-02-08 – 2014-02-09 (×4): 0.4 mg via SUBLINGUAL
  Filled 2014-02-08 (×2): qty 1

## 2014-02-08 MED ORDER — LIDOCAINE-PRILOCAINE 2.5-2.5 % EX CREA: 1.0000 "application " | TOPICAL_CREAM | Freq: Every day | CUTANEOUS | Status: DC | PRN

## 2014-02-08 MED ORDER — ASPIRIN 81 MG PO CHEW
81.0000 mg | CHEWABLE_TABLET | ORAL | Status: AC
  Administered 2014-02-09: 81 mg via ORAL
  Filled 2014-02-08: qty 1

## 2014-02-08 MED ORDER — ASPIRIN 81 MG PO CHEW
81.0000 mg | CHEWABLE_TABLET | Freq: Every day | ORAL | Status: DC
  Administered 2014-02-08 – 2014-02-09 (×2): 81 mg via ORAL
  Filled 2014-02-08 (×3): qty 1

## 2014-02-08 MED ORDER — SEVELAMER CARBONATE 800 MG PO TABS
1600.0000 mg | ORAL_TABLET | Freq: Three times a day (TID) | ORAL | Status: DC
  Administered 2014-02-08 – 2014-02-10 (×2): 1600 mg via ORAL
  Filled 2014-02-08 (×9): qty 2

## 2014-02-08 MED ORDER — CARVEDILOL 25 MG PO TABS
25.0000 mg | ORAL_TABLET | Freq: Two times a day (BID) | ORAL | Status: DC
  Administered 2014-02-08 – 2014-02-10 (×3): 25 mg via ORAL
  Filled 2014-02-08 (×7): qty 1

## 2014-02-08 MED ORDER — SODIUM CHLORIDE 0.9 % IJ SOLN: 3.0000 mL | INTRAMUSCULAR | Status: DC | PRN

## 2014-02-08 MED ORDER — SODIUM CHLORIDE 0.9 % IJ SOLN
3.0000 mL | Freq: Two times a day (BID) | INTRAMUSCULAR | Status: DC
  Administered 2014-02-08: 3 mL via INTRAVENOUS

## 2014-02-08 MED ORDER — INSULIN NPH (HUMAN) (ISOPHANE) 100 UNIT/ML ~~LOC~~ SUSP: 10.0000 [IU] | Freq: Every day | SUBCUTANEOUS | Status: DC | PRN

## 2014-02-08 MED ORDER — INSULIN NPH (HUMAN) (ISOPHANE) 100 UNIT/ML ~~LOC~~ SUSP
10.0000 [IU] | Freq: Two times a day (BID) | SUBCUTANEOUS | Status: DC
  Administered 2014-02-08 – 2014-02-09 (×2): 10 [IU] via SUBCUTANEOUS
  Filled 2014-02-08 (×2): qty 10

## 2014-02-08 MED ORDER — HEPARIN BOLUS VIA INFUSION
4000.0000 [IU] | Freq: Once | INTRAVENOUS | Status: AC
  Administered 2014-02-08: 4000 [IU] via INTRAVENOUS
  Filled 2014-02-08: qty 4000

## 2014-02-08 MED ORDER — LOPERAMIDE HCL 2 MG PO CAPS: 2.0000 mg | ORAL_CAPSULE | Freq: Every day | ORAL | Status: DC | PRN

## 2014-02-08 NOTE — Progress Notes (Signed)
Pending cath tomorrow. Discussed with Dr. Florene Glen of Nephrology who help assistant with inpt dialysis need.   [name] Corrigan PA Pager: [phone]

## 2014-02-08 NOTE — H&P (Signed)
Patient ID: [name] [name] MRN: [id number], DOB/AGE: 03-12-1940   Admit date: 02/08/2014   Primary Physician: Annye Asa, MD Primary Cardiologist: Dr. Ellyn Hack  Pt. Profile:  74 year old Caucasian male with PMH of HTN, HLD, DM, hypothyroidism, history of colon CA s/p colectomy, chronic LBBB and ESRD on HD T/Th/Sat, history of CAD status post CABG 2 and multiple PCI presented with recurrent CP concerning for unstable angina  Problem List  Past Medical History  Diagnosis Date  . Diabetes mellitus   . Hyperlipidemia   . Hypertension   . Gout   . Hypothyroidism     On supplementation  . LBBB (left bundle branch block)     Chronic  . History of colon cancer 2003    colectomy for CA. no recurrence . never required  radiation or chem  . Anemia     Likely secondary to history of GI bleed  . Stroke 1998  . End stage renal disease on dialysis     Dialyzes at [facility]: Tues/Th/Sat - left upper cavity AV fistula brachiocephalic  . CAD in native artery; and the grafts 1992, 1997, 2002, 2006, 2012    CABG 1992 and redo CABG 2006  . CAD (coronary artery disease) of bypass graft 2006, 2012    In 2006: Occluded SVG-OM noted (SVG-diagonal was previously occluded); 2012: Severe lesion in redo SVG-OM1 --> BMS PCI   . CAD S/P percutaneous coronary angioplasty October 2012; Feb, Apr & Oct 2015;     10/2010: Non-STEMI: PCI to SVG-OM1 Integrity BMS 3.5 mm x 15 mm (3.85 mm);; 02/2013: PCI to mid RCA Xience Apling DES 2.75 mm x 15 mm (3.1 mm) ;; 04/2013 : dRCA  Integrity Resolute DES   2.25 mm x 69mm (2.5 mm);; 10/'15: PCI to mRCA ISR w/ post stent lesion - Promus P 2.75 m x 16 Atm (3.25 mm)  . History of nuclear stress test Sept 2014    EF 39% (down from) 45%, no ischemia; but now the inferobasal perfusion defect is determined to be likley prior infarction (as opposed to diaphragmatic attenuation)  . Echocardiogram findings abnormal, without diagnosis [id number]; Oct 2014    EF 50-55%,  anteroseptal hypokinesis. Mild to moderate concentric LVH. Mild MR, aortic sclerosis. Mild atrial dilatation bilaterally.; Oct - mild LVH, EF ~50-55%, incoordinate septal WM, - with no other notable WMA  . History of Non-ST elevated myocardial infarction (non-STEMI) 1992, 2006, 2012; 02/2013  . Peptic ulcer disease  2004  . BPH (benign prostatic hyperplasia)   . Chronic low back pain     Past Surgical History  Procedure Laterality Date  . Appendectomy    . Cholecystectomy  2009  . Eye surgery      bilateral cataracts   . Spinal fusion    . Av fistula repair Left     5/11  . Colon resection      transverse and proximal descending w/ primar anastomosis  . Doppler echocardiography  01/25/2012    EF 50 to 55%  . Nm myocar perf wall motion  10/12/2011    EF 54%,LV normal ; no signifiant ischemia  . Cardiac catheterization  10/16/2010    patent  LIMA to the LAD , PATENT  svg TO 2nd marginals ,occluded PLA with collaterals and SVG to the OM  had 90% stenosis  was txlge  bare - metal  3.5  Integrity ten  postdilate  36-37  mm    . Cardiac catheterization  03/22/2004  loss of both SVG,severe disease in prox and ostial  circ not amenable to invention. mod diease distal LAD  AFTER BYPASS GRAFT;-CVTS to evaluate   . Coronary angioplasty  04/03/1995    OM and Circ  . Coronary angioplasty  02/01/2000    CIRC  . Carotid doppler  05/23/2012    ABN CAROTID-- RGT BULB/PROX ICA mild to mod 50-60%;lft bulb/prox mild to mod 0-49%;left subclavian abn waveforms consistent with patients lft arm A/V fistula  . Event monitor  01/23/2012-02/06/2012    SINUS ,LBBB,unifocal PVCs  . Coronary artery bypass graft  08/22/1990    INITIAL:LIMA to LAD, SVG to  Diagonal, SVG to OM  . Coronary artery bypass graft  2006    SVG to OM1,SVG to OM2 with patent LIMA  to LAD and occluded vein  graft to the diagonal  and occluded vein grft to OM from  prior  surgery  . Percutaneous coronary stent intervention (pci-s)   02/23/2013    mid RCA 80% & 70% - Xience 2.75 mm x 15 mm ( 3.17mm) ; LIMA-LAD patent, SVG-OM patent (stent patent); SVG-Diag patent.  . Transthoracic echocardiogram  02/23/2013    Moderate concentric hypertrophy. EF 4500%. Septal bounce. Grade 2 diastolic dysfunction (pseudo-normal - severely elevated filling pressures) mild to moderate MR and moderate LA dilation to  . Percutaneous coronary stent intervention (pci-s)  April 2015    dRCA - Integrity Resolute DES   2.25 mm x 38mm (2.5 mm)  . Percutaneous coronary stent intervention (pci-s)  Oct 15 2013    [place] Angina: mRCA ISR with post-stent stenosis -- Cuting PTCA & PCI Promus Premier DES 2.75 mm x 16 mm (3.25 mm)  . Lower extremity arterial dopplers  October 2014    Calcified but non-occlusive peripheral arteries. No evidence of stenosis  . Left heart catheterization with coronary angiogram N/A 02/23/2013    Procedure: LEFT HEART CATHETERIZATION WITH CORONARY ANGIOGRAM;  Surgeon: Lorretta Harp, MD;  Location: [facility] CATH LAB;  Service: Cardiovascular;  Laterality: N/A;  . Left heart catheterization with coronary/graft angiogram N/A 04/30/2013    Procedure: LEFT HEART CATHETERIZATION WITH Beatrix Fetters;  Surgeon: Leonie Man, MD;  Location: [facility] CATH LAB;  Service: Cardiovascular;  Laterality: N/A;  . Left heart catheterization with coronary/graft angiogram N/A 10/15/2013    Procedure: LEFT HEART CATHETERIZATION WITH Beatrix Fetters;  Surgeon: Leonie Man, MD;  Location: [facility] CATH LAB;  Service: Cardiovascular;  Laterality: N/A;     Allergies  Allergies  Allergen Reactions  . Shellfish Allergy     Causes gout flare-ups    HPI  The patient is a pleasant 74 year old Caucasian male with PMH of HTN, HLD, DM, hypothyroidism, history of colon CA s/p colectomy, chronic LBBB and ESRD on HD T/Th/Sat, history of CAD status post CABG 2 and multiple PCI. According to the previous record, he initially underwent CABG in 1992. He  had redo CABG in 2006 after he was found to have occluded SVG to OM, previously known occlusion of SVG to diagonal. In 2012, he was noted to have severe lesion in redo SVG to OM1 s/p BMS. During the last year alone, he had 3 cardiac catheterization, in February 2015, April and October 2015. He received a PCI to mid RCA in February 2015, EF 45-50% on echo during that admission. He came back and got a DES to distal RCA in April. He had in-stent restenosis of mid RCA stenosis in October 2015 after referred by his nephrologist for progressive  angina. The last time he was seen by Dr. Ellyn Hack was in November 2015, at which time he states he had occasional anginal like symptom with heavy exertion, however has largely been chest pain-free. According to the patient, he has been having intermittent chest discomfort since late November last year. He states he has been compliant with the DAPT medication at home. His chest discomfort occur both at rest and with exertion. He described as a pressure-like sensation radiating to bilateral neck and also left shoulder. He denies any associated symptoms like shortness of breath or diaphoresis. The only alleviating factor is nitroglycerin, there has been no exacerbating factor.  He woke up around 12:30 AM in the morning of 02/08/2014 with chest pain. The chest pain resolved after taking nitroglycerin, however recurred 2 hours later. Since this morning, he has taken multiple nitroglycerin for recurrent chest pain happening every 2 hours. Per record, patient attempted to call cardiology office and was referred to [facility] for further evaluation. On arrival his blood pressure was 173/60. O2 saturation 98% on room air. Significant laboratory finding include sodium 138, potassium 4.0, creatinine 4.94, troponin 0.06, hemoglobin 10.9. Chest x-ray was negative for acute etiology. EKG showed a chronic left bundle branch block unchanged compared to the previous EKG. Cardiology has been  consulted for chest pain.   Home Medications  Prior to Admission medications   Medication Sig Start Date End Date Taking? Authorizing Provider  allopurinol (ZYLOPRIM) 100 MG tablet Take 100 mg by mouth daily.   Yes Historical Provider, MD  aspirin 81 MG chewable tablet Chew 81 mg by mouth at bedtime.   Yes Historical Provider, MD  atorvastatin (LIPITOR) 40 MG tablet Take 40 mg by mouth at bedtime.   Yes Historical Provider, MD  Besifloxacin HCl (BESIVANCE) 0.6 % SUSP Place 1 drop into both eyes See admin instructions. Use eye drops 4 times daily for 2 days following injection by Dr. Zigmund Daniel (once a month)   Yes Historical Provider, MD  carvedilol (COREG) 25 MG tablet Take 25 mg by mouth 2 (two) times daily with a meal.   Yes Historical Provider, MD  clopidogrel (PLAVIX) 75 MG tablet Take 1 tablet (75 mg total) by mouth daily. 10/16/13  Yes Rhonda G Barrett, PA-C  glucose blood (PRODIGY NO CODING BLOOD GLUC) test strip Test as directed 12/17/11  Yes Midge Minium, MD  insulin NPH Human (HUMULIN N,NOVOLIN N) 100 UNIT/ML injection Inject 10-20 Units into the skin daily as needed (blood glucose). As needed   Yes Historical Provider, MD  isosorbide mononitrate (IMDUR) 60 MG 24 hr tablet Take 60 mg by mouth 2 (two) times daily. 07/15/13  Yes Midge Minium, MD  levothyroxine (SYNTHROID, LEVOTHROID) 125 MCG tablet Take 1 tablet (125 mcg total) by mouth daily before breakfast. 11/06/13  Yes Midge Minium, MD  lidocaine-prilocaine (EMLA) cream Apply 1 application topically daily as needed (prior to dialysis treatments).    Yes Historical Provider, MD  loperamide (IMODIUM) 2 MG capsule Take 2 mg by mouth daily as needed for diarrhea or loose stools.    Yes Historical Provider, MD  nitroGLYCERIN (NITROSTAT) 0.4 MG SL tablet Place 1 tablet (0.4 mg total) under the tongue every 5 (five) minutes as needed for chest pain. 11/18/13  Yes Midge Minium, MD  sevelamer carbonate (RENVELA) 800 MG  tablet Take 3(three)  $RemoveB'800mg'AYhQUlKH$  tablets by mouth with every meal.   Yes Historical Provider, MD  terazosin (HYTRIN) 2 MG capsule Take 1 capsule (2  mg total) by mouth at bedtime. 11/06/13  Yes Midge Minium, MD  trimethoprim-polymyxin b (POLYTRIM) ophthalmic solution Place 1 drop into both eyes as needed.  09/21/13  Yes Historical Provider, MD  multivitamin (RENA-VIT) TABS tablet Take 1 tablet by mouth daily. Patient not taking: Reported on 02/08/2014 12/17/11   Midge Minium, MD    Family History  Family History  Problem Relation Age of Onset  . Heart disease Mother   . Hypertension Mother   . Diabetes Mother   . Heart disease Father   . Hypertension Father   . Diabetes Father     Social History  History   Social History  . Marital Status: Married    Spouse Name: N/A    Number of Children: 3  . Years of Education: N/A   Occupational History  . Not on file.   Social History Main Topics  . Smoking status: Never Smoker   . Smokeless tobacco: Never Used  . Alcohol Use: No  . Drug Use: No  . Sexual Activity: Not on file   Other Topics Concern  . Not on file   Social History Narrative   Long-term patient of Dr. Rollene Fare.   Married father of 53, grandfather 22.   Never smoked.   Not exercising D2 hip and back pain & now Angina     Review of Systems General:  No chills, fever, night sweats or weight changes.  Cardiovascular:  No dyspnea on exertion, edema, orthopnea, palpitations, paroxysmal nocturnal dyspnea. +chest pain Dermatological: No rash, lesions/masses Respiratory: No cough, dyspnea Urologic: No hematuria, dysuria Abdominal:   No nausea, vomiting, diarrhea, bright red blood per rectum, melena, or hematemesis Neurologic:  No visual changes, wkns, changes in mental status. All other systems reviewed and are otherwise negative except as noted above.  Physical Exam  Blood pressure 160/67, pulse 61, temperature 97.8 F (36.6 C), temperature source Oral,  resp. rate 15, SpO2 96 %.  General: Pleasant, NAD Psych: Normal affect. Neuro: Alert and oriented X 3. Moves all extremities spontaneously. HEENT: Normal  Neck: Supple without bruits or JVD. Lungs:  Resp regular and unlabored, CTA. Heart: RRR no s3, s4, or murmurs. Midsternal scar noted.  Abdomen: Soft, non-tender, non-distended, BS + x 4.  Extremities: No clubbing, cyanosis or edema. DP/PT/Radials 2+ and equal bilaterally.  Labs  Troponin (Point of Care Test) No results for input(s): TROPIPOC in the last 72 hours.  Recent Labs  02/08/14 0942  TROPONINI 0.06*   Lab Results  Component Value Date   WBC 8.4 02/08/2014   HGB 11.6* 02/08/2014   HCT 34.0* 02/08/2014   MCV 90.9 02/08/2014   PLT 187 02/08/2014    Recent Labs Lab 02/08/14 0942 02/08/14 0950  NA 138 137  K 4.0 4.2  CL 97 96  CO2 29  --   BUN 33* 34*  CREATININE 4.94* 4.80*  CALCIUM 9.7  --   PROT 6.6  --   BILITOT 0.7  --   ALKPHOS 79  --   ALT 17  --   AST 21  --   GLUCOSE 159* 157*   Lab Results  Component Value Date   CHOL 71 11/18/2013   HDL 22.40* 11/18/2013   LDLCALC 17 11/18/2013   TRIG 156.0* 11/18/2013   No results found for: DDIMER   Radiology/Studies  Dg Chest 2 View  02/08/2014   CLINICAL DATA:  Chest pain starting last night.  History of CABG  EXAM: CHEST  2 VIEW  COMPARISON:  04/13/2013  FINDINGS: Stable prominence of the left ventricle; no cardiomegaly. CABG changes are again noted, as is coronary stenting in the left circulation. Chronic eventration of the right diaphragm. There is no edema, consolidation, effusion, or pneumothorax. No osseous findings to explain acute chest pain.  IMPRESSION: No active cardiopulmonary disease.   Electronically Signed   By: Jorje Guild M.D.   On: 02/08/2014 10:13    ECG  NSR with LBBB  Echocardiogram  LV EF: 45% -  50%  ------------------------------------------------------------ Indications:   Chest pain  786.51.  ------------------------------------------------------------ History:  PMH:  Coronary artery disease. Risk factors: Hypertension. Diabetes mellitus.  ------------------------------------------------------------ Study Conclusions  - Left ventricle: There is false tendon in the apex. There is paradoxical septal motion. The cavity size was normal. There was moderate concentric hypertrophy. Systolic function was mildly reduced. The estimated ejection fraction was in the range of 45% to 50%. Features are consistent with a pseudonormal left ventricular filling pattern, with concomitant abnormal relaxation and increased filling pressure (grade 2 diastolic dysfunction). Doppler parameters are consistent with severely elevated ventricular end-diastolic filling pressure. - Ventricular septum: Septal motion showed paradox. - Aortic valve: Mild regurgitation. - Mitral valve: Mild to moderate regurgitation. - Left atrium: The atrium was moderately dilated. - Right ventricle: Systolic function was normal. - Right atrium: The atrium was normal in size. - Tricuspid valve: Mild regurgitation. - Pulmonic valve: No regurgitation. - Pulmonary arteries: Systolic pressure was within the normal range. - Inferior vena cava: The vessel was normal in size. - Pericardium, extracardiac: There was no pericardial effusion.    ASSESSMENT AND PLAN  1. Chest pain concerning for unstable angina with mild trop lead in the setting of ESRD  - patient has very significant CAD history, noted below  - given his symptom, concerning for unstable angina. Will schedule cath with Dr. Ellyn Hack tomorrow  - given his recent instent restenosis despite DAPT compliance, will check P2Y12 before tomorrow's cath  - his shellfish allergy is related to gout exacerbation with shellfish. No prior h/o allergy with contrast dye. No need to pretreat. Start IV heparin  2. CAD s/p CABG 2002, redo CABG  2006  - 2002 CABG LIMA to LAD, SVG to OM1, SVG to OM2, SVG to diag  - cath 2006 previously occluded SVG to diagonal, newly occluded SVG to OM. Redo SVG to OM1 CABG  - cath 10/2010 PCI to SVG-OM1 Integrity BMS 3.5 mm x 15 mm (3.85 mm)  - cath 02/2013 02/2013: PCI to mid RCA Xience Apling DES 2.75 mm x 15 mm (3.1 mm)    - cath 04/2013 : dRCA Integrity Resolute DES 2.25 mm x 23mm (2.5 mm)  - cath Oct 2015 patent SVG to OM1, SVG to OM2, LIMA to LAD, 99% in-stent restenosis to mid RCA treated with overlapping DES (Promus Premier DES 2.75 mg 16 mm)  3. ESRD on HD T/Th/Sat: will contact nephrology for HD tomorrow 4. HTN 5. HLD 6. DM 7. Hypothyroidism 8. history of colon CA s/p colectomy 9. chronic LBBB   Signed, Almyra Deforest, PA-C 02/08/2014, 1:39 PM   The patient was seen, examined and discussed with Almyra Deforest, PA-C and I agree with the above.   A very pleasant 74 year old male with extensive h/o CAD, s/o CABG in 2002 and 2006, multiple stenting, including 3 stents in 2015, followed by Dr Ellyn Hack, the last seen in November 2015. Shortly after the patient developed progressively worsening symptoms of exertional angina that progressed to resting  angina - the last night. He is responsive to sl NTG. His 1.troponin is 0.05, we will follow trend considering his underlying kidney disease. We will start iv Heparin for unstable angina and schedule a cath for tomorrow. HD will follow the cath.   Dorothy Spark 02/08/2014

## 2014-02-08 NOTE — ED Notes (Addendum)
Pt presents to ED for worsening angina. Pt woken from sleep at 1230 this morning. Pt has extensive cardiac hx. Pt pain relieved by nitro. Presents to ed today because he feels he does not have enough nitro to make it until his doctors appt Friday. Pt normally takes 1-2 nitro/day, pt has been taking 1 nitro q 2 hours since 1230. Alert and oriented x4. In NAD at this time.  Pt dialysis pt, fistula in left upper arm, bicep area. Dialysis not needed until tomorrow. Dialysis pt for 3 years.

## 2014-02-08 NOTE — ED Notes (Signed)
Cardiology at bedside.

## 2014-02-08 NOTE — Telephone Encounter (Signed)
Patient called in with increasing angina. He comments that the pain woke him from sleep at 1230am this morning. He took a ntg.  The pain resolves after one ntg, but has been returning every two hours requiring another ntg.  His last ntg was at 630am and he is currently not experiencing any chest pain.  I advised [name] [name] to go to the ER.  I explained that that was the best place for [place] to be able to treat him.  He seemed a little hesitant,but he said,"ok."

## 2014-02-08 NOTE — Progress Notes (Signed)
ANTICOAGULATION CONSULT NOTE - Initial Consult  Pharmacy Consult for Heparin Indication: chest pain/ACS  Allergies  Allergen Reactions  . Shellfish Allergy     Causes gout flare-ups    Patient Measurements: Weight from 11/18/2013 was 74.6 kg  Vital Signs: Temp: 97.8 F (36.6 C) (02/01 1043) Temp Source: Oral (02/01 1043) BP: 159/53 mmHg (02/01 1400) Pulse Rate: 57 (02/01 1400)  Labs:  Recent Labs  02/08/14 0942 02/08/14 0950  HGB 10.9* 11.6*  HCT 32.8* 34.0*  PLT 187  --   CREATININE 4.94* 4.80*  TROPONINI 0.06*  --     CrCl cannot be calculated (Unknown ideal weight.).   Medical History: Past Medical History  Diagnosis Date  . Diabetes mellitus   . Hyperlipidemia   . Hypertension   . Gout   . Hypothyroidism     On supplementation  . LBBB (left bundle branch block)     Chronic  . History of colon cancer 2003    colectomy for CA. no recurrence . never required  radiation or chem  . Anemia     Likely secondary to history of GI bleed  . Stroke 1998  . End stage renal disease on dialysis     Dialyzes at [facility]: Tues/Th/Sat - left upper cavity AV fistula brachiocephalic  . CAD in native artery; and the grafts 1992, 1997, 2002, 2006, 2012    CABG 1992 and redo CABG 2006  . CAD (coronary artery disease) of bypass graft 2006, 2012    In 2006: Occluded SVG-OM noted (SVG-diagonal was previously occluded); 2012: Severe lesion in redo SVG-OM1 --> BMS PCI   . CAD S/P percutaneous coronary angioplasty October 2012; Feb, Apr & Oct 2015;     10/2010: Non-STEMI: PCI to SVG-OM1 Integrity BMS 3.5 mm x 15 mm (3.85 mm);; 02/2013: PCI to mid RCA Xience Apling DES 2.75 mm x 15 mm (3.1 mm) ;; 04/2013 : dRCA  Integrity Resolute DES   2.25 mm x 40m (2.5 mm);; 10/'15: PCI to mRCA ISR w/ post stent lesion - Promus P 2.75 m x 16 Atm (3.25 mm)  . History of nuclear stress test Sept 2014    EF 39% (down from) 45%, no ischemia; but now the inferobasal perfusion defect is  determined to be likley prior infarction (as opposed to diaphragmatic attenuation)  . Echocardiogram findings abnormal, without diagnosis [id number] Oct 2014    EF 50-55%, anteroseptal hypokinesis. Mild to moderate concentric LVH. Mild MR, aortic sclerosis. Mild atrial dilatation bilaterally.; Oct - mild LVH, EF ~50-55%, incoordinate septal WM, - with no other notable WMA  . History of Non-ST elevated myocardial infarction (non-STEMI) 1992, 2006, 2012; 02/2013  . Peptic ulcer disease  2004  . BPH (benign prostatic hyperplasia)   . Chronic low back pain    Assessment: 74yo M presents on 2/1 with chest pain. Resolved with nitroglycerin, but continued to require more. PMH includes HTN, HLD, DM, chronic LBBB, ESRD, s/p CABG x 2. Troponin 0.06, EKG showed LBBB unchanged from previous. Pharmacy to dose heparin for ACS.  Goal of Therapy:  Heparin level 0.3-0.7 units/ml Monitor platelets by anticoagulation protocol: Yes   Plan:  Give heparin BOLUS of 4,000 units Start heparin gtt at 950 units/hr Check 8 hr HL at 2330 tonight Monitor daily HL, CBC, s/s of bleed F/U cath tomorrow  BReginia Naas2/01/2014,3:09 PM

## 2014-02-08 NOTE — Telephone Encounter (Signed)
Pt called in stating that he has been having a lot of angina for the past 2 days and would like to speak with Dr. Ellyn Hack about this matter. Please follow up  Thanks

## 2014-02-08 NOTE — ED Provider Notes (Signed)
CSN: [id number]     Arrival date & time 02/08/14  0915 History   First MD Initiated Contact with Patient 02/08/14 [phone]     Chief Complaint  Patient presents with  . Chest Pain     (Consider location/radiation/quality/duration/timing/severity/associated sxs/prior Treatment) Patient is a 74 y.o. male presenting with chest pain. The history is provided by the patient.  Chest Pain Pain location:  Substernal area Pain quality: aching and tightness   Pain radiates to:  L shoulder and R shoulder Pain radiates to the back: no   Pain severity:  Moderate Onset quality:  Gradual Duration:  8 hours Timing:  Constant Progression:  Unchanged Chronicity:  Recurrent Context comment:  Patient has an extensive heart history and states he normally gets chest pain every day and usually has to take 2-3 nitroglycerin daily however since midnight he has had worsening chest pain requiring him to take nitroglycerin every 2 hours for the last 8. Relieved by:  Nitroglycerin Worsened by:  Nothing tried Ineffective treatments:  None tried Associated symptoms: no abdominal pain, no altered mental status, no cough, no diaphoresis, no fever, no lower extremity edema, no palpitations, no shortness of breath, no syncope and no weakness   Risk factors: coronary artery disease, diabetes mellitus and hypertension   Risk factors: no immobilization, no smoking and no surgery     Past Medical History  Diagnosis Date  . Diabetes mellitus   . Hyperlipidemia   . Hypertension   . Gout   . Hypothyroidism     On supplementation  . LBBB (left bundle branch block)     Chronic  . History of colon cancer 2003    colectomy for CA. no recurrence . never required  radiation or chem  . Anemia     Likely secondary to history of GI bleed  . Stroke 1998  . End stage renal disease on dialysis     Dialyzes at [facility]: Tues/Th/Sat - left upper cavity AV fistula brachiocephalic  . CAD in native artery; and the grafts  1992, 1997, 2002, 2006, 2012    CABG 1992 and redo CABG 2006  . CAD (coronary artery disease) of bypass graft 2006, 2012    In 2006: Occluded SVG-OM noted (SVG-diagonal was previously occluded); 2012: Severe lesion in redo SVG-OM1 --> BMS PCI   . CAD S/P percutaneous coronary angioplasty October 2012; Feb, Apr & Oct 2015;     10/2010: Non-STEMI: PCI to SVG-OM1 Integrity BMS 3.5 mm x 15 mm (3.85 mm);; 02/2013: PCI to mid RCA Xience Apling DES 2.75 mm x 15 mm (3.1 mm) ;; 04/2013 : dRCA  Integrity Resolute DES   2.25 mm x 64mm (2.5 mm);; 10/'15: PCI to mRCA ISR w/ post stent lesion - Promus P 2.75 m x 16 Atm (3.25 mm)  . History of nuclear stress test Sept 2014    EF 39% (down from) 45%, no ischemia; but now the inferobasal perfusion defect is determined to be likley prior infarction (as opposed to diaphragmatic attenuation)  . Echocardiogram findings abnormal, without diagnosis [id number]; Oct 2014    EF 50-55%, anteroseptal hypokinesis. Mild to moderate concentric LVH. Mild MR, aortic sclerosis. Mild atrial dilatation bilaterally.; Oct - mild LVH, EF ~50-55%, incoordinate septal WM, - with no other notable WMA  . History of Non-ST elevated myocardial infarction (non-STEMI) 1992, 2006, 2012; 02/2013  . Peptic ulcer disease  2004  . BPH (benign prostatic hyperplasia)   . Chronic low back pain    Past Surgical  History  Procedure Laterality Date  . Appendectomy    . Cholecystectomy  2009  . Eye surgery      bilateral cataracts   . Spinal fusion    . Av fistula repair Left     5/11  . Colon resection      transverse and proximal descending w/ primar anastomosis  . Doppler echocardiography  01/25/2012    EF 50 to 55%  . Nm myocar perf wall motion  10/12/2011    EF 54%,LV normal ; no signifiant ischemia  . Cardiac catheterization  10/16/2010    patent  LIMA to the LAD , PATENT  svg TO 2nd marginals ,occluded PLA with collaterals and SVG to the OM  had 90% stenosis  was txlge  bare - metal  3.5   Integrity ten  postdilate  36-37  mm    . Cardiac catheterization  03/22/2004    loss of both SVG,severe disease in prox and ostial  circ not amenable to invention. mod diease distal LAD  AFTER BYPASS GRAFT;-CVTS to evaluate   . Coronary angioplasty  04/03/1995    OM and Circ  . Coronary angioplasty  02/01/2000    [facility]  . Carotid doppler  05/23/2012    ABN CAROTID-- RGT BULB/PROX ICA mild to mod 50-60%;lft bulb/prox mild to mod 0-49%;left subclavian abn waveforms consistent with patients lft arm A/V fistula  . Event monitor  01/23/2012-02/06/2012    SINUS ,LBBB,unifocal PVCs  . Coronary artery bypass graft  08/22/1990    INITIAL:LIMA to LAD, SVG to  Diagonal, SVG to OM  . Coronary artery bypass graft  2006    SVG to OM1,SVG to OM2 with patent LIMA  to LAD and occluded vein  graft to the diagonal  and occluded vein grft to OM from  prior  surgery  . Percutaneous coronary stent intervention (pci-s)  02/23/2013    mid RCA 80% & 70% - Xience 2.75 mm x 15 mm ( 3.7mm) ; LIMA-LAD patent, SVG-OM patent (stent patent); SVG-Diag patent.  . Transthoracic echocardiogram  02/23/2013    Moderate concentric hypertrophy. EF 4500%. Septal bounce. Grade 2 diastolic dysfunction (pseudo-normal - severely elevated filling pressures) mild to moderate MR and moderate LA dilation to  . Percutaneous coronary stent intervention (pci-s)  April 2015    dRCA - Integrity Resolute DES   2.25 mm x 63mm (2.5 mm)  . Percutaneous coronary stent intervention (pci-s)  Oct 15 2013    [place] Angina: mRCA ISR with post-stent stenosis -- Cuting PTCA & PCI Promus Premier DES 2.75 mm x 16 mm (3.25 mm)  . Lower extremity arterial dopplers  October 2014    Calcified but non-occlusive peripheral arteries. No evidence of stenosis  . Left heart catheterization with coronary angiogram N/A 02/23/2013    Procedure: LEFT HEART CATHETERIZATION WITH CORONARY ANGIOGRAM;  Surgeon: Lorretta Harp, MD;  Location: [facility] CATH LAB;  Service:  Cardiovascular;  Laterality: N/A;  . Left heart catheterization with coronary/graft angiogram N/A 04/30/2013    Procedure: LEFT HEART CATHETERIZATION WITH Beatrix Fetters;  Surgeon: Leonie Man, MD;  Location: [facility] CATH LAB;  Service: Cardiovascular;  Laterality: N/A;  . Left heart catheterization with coronary/graft angiogram N/A 10/15/2013    Procedure: LEFT HEART CATHETERIZATION WITH Beatrix Fetters;  Surgeon: Leonie Man, MD;  Location: [facility] CATH LAB;  Service: Cardiovascular;  Laterality: N/A;   Family History  Problem Relation Age of Onset  . Heart disease Mother   . Hypertension Mother   .  Diabetes Mother   . Heart disease Father   . Hypertension Father   . Diabetes Father    History  Substance Use Topics  . Smoking status: Never Smoker   . Smokeless tobacco: Never Used  . Alcohol Use: No    Review of Systems  Constitutional: Negative for fever and diaphoresis.  Respiratory: Negative for cough and shortness of breath.   Cardiovascular: Positive for chest pain. Negative for palpitations and syncope.  Gastrointestinal: Negative for abdominal pain.  Neurological: Negative for weakness.  All other systems reviewed and are negative.     Allergies  Shellfish allergy  Home Medications   Prior to Admission medications   Medication Sig Start Date End Date Taking? Authorizing Provider  allopurinol (ZYLOPRIM) 100 MG tablet Take 100 mg by mouth daily.    Historical Provider, MD  aspirin 81 MG chewable tablet Chew 81 mg by mouth at bedtime.    Historical Provider, MD  atorvastatin (LIPITOR) 40 MG tablet Take 40 mg by mouth at bedtime.    Historical Provider, MD  Besifloxacin HCl (BESIVANCE) 0.6 % SUSP Place 1 drop into both eyes See admin instructions. Use eye drops 4 times daily for 2 days following injection by Dr. Zigmund Daniel (once a month)    Historical Provider, MD  carvedilol (COREG) 25 MG tablet Take 25 mg by mouth 2 (two) times daily with a meal.     Historical Provider, MD  clopidogrel (PLAVIX) 75 MG tablet Take 1 tablet (75 mg total) by mouth daily. 10/16/13   Rhonda G Barrett, PA-C  glucose blood (PRODIGY NO CODING BLOOD GLUC) test strip Test as directed 12/17/11   Midge Minium, MD  insulin NPH Human (HUMULIN N,NOVOLIN N) 100 UNIT/ML injection Inject 15 Units into the skin 2 (two) times daily before a meal. As needed    Historical Provider, MD  isosorbide mononitrate (IMDUR) 60 MG 24 hr tablet Take 60 mg by mouth 2 (two) times daily. 07/15/13   Midge Minium, MD  levothyroxine (SYNTHROID, LEVOTHROID) 125 MCG tablet Take 1 tablet (125 mcg total) by mouth daily before breakfast. 11/06/13   Midge Minium, MD  lidocaine-prilocaine (EMLA) cream Apply 1 application topically daily as needed (prior to dialysis treatments).     Historical Provider, MD  loperamide (IMODIUM) 2 MG capsule Take 2 mg by mouth daily as needed for diarrhea or loose stools.     Historical Provider, MD  multivitamin (RENA-VIT) TABS tablet Take 1 tablet by mouth daily. 12/17/11   Midge Minium, MD  nitroGLYCERIN (NITROSTAT) 0.4 MG SL tablet Place 1 tablet (0.4 mg total) under the tongue every 5 (five) minutes as needed for chest pain. 11/18/13   Midge Minium, MD  sevelamer carbonate (RENVELA) 800 MG tablet Take 3(three)  $RemoveB'800mg'[id number]$  tablets by mouth with every meal.    Historical Provider, MD  terazosin (HYTRIN) 2 MG capsule Take 1 capsule (2 mg total) by mouth at bedtime. 11/06/13   Midge Minium, MD  trimethoprim-polymyxin b (POLYTRIM) ophthalmic solution Place 1 drop into both eyes as needed.  09/21/13   Historical Provider, MD   BP 168/65 mmHg  Pulse 59  Resp 11  SpO2 100% Physical Exam  Constitutional: He is oriented to person, place, and time. He appears well-developed and well-nourished. No distress.  HENT:  Head: Normocephalic and atraumatic.  Mouth/Throat: Oropharynx is clear and moist.  Eyes: Conjunctivae and EOM are normal. Pupils are  equal, round, and reactive to light.  Neck: Normal range  of motion. Neck supple.  Cardiovascular: Normal rate, regular rhythm and intact distal pulses.   No murmur heard. Pulmonary/Chest: Effort normal and breath sounds normal. No respiratory distress. He has no wheezes. He has no rales. He exhibits no tenderness.  Abdominal: Soft. He exhibits no distension. There is no tenderness. There is no rebound and no guarding.  Musculoskeletal: Normal range of motion. He exhibits no edema or tenderness.  Fistula in the left upper arm  Neurological: He is alert and oriented to person, place, and time.  Skin: Skin is warm and dry. No rash noted. No erythema.  Psychiatric: He has a normal mood and affect. His behavior is normal.  Nursing note and vitals reviewed.   ED Course  Procedures (including critical care time) Labs Review Labs Reviewed  CBC WITH DIFFERENTIAL/PLATELET - Abnormal; Notable for the following:    RBC 3.61 (*)    Hemoglobin 10.9 (*)    HCT 32.8 (*)    Neutrophils Relative % 81 (*)    Lymphocytes Relative 9 (*)    All other components within normal limits  COMPREHENSIVE METABOLIC PANEL - Abnormal; Notable for the following:    Glucose, Bld 159 (*)    BUN 33 (*)    Creatinine, Ser 4.94 (*)    GFR calc non Af Amer 11 (*)    GFR calc Af Amer 12 (*)    All other components within normal limits  TROPONIN I - Abnormal; Notable for the following:    Troponin I 0.06 (*)    All other components within normal limits  I-STAT CHEM 8, ED - Abnormal; Notable for the following:    BUN 34 (*)    Creatinine, Ser 4.80 (*)    Glucose, Bld 157 (*)    Hemoglobin 11.6 (*)    HCT 34.0 (*)    All other components within normal limits    Imaging Review Dg Chest 2 View  02/08/2014   CLINICAL DATA:  Chest pain starting last night.  History of CABG  EXAM: CHEST  2 VIEW  COMPARISON:  04/13/2013  FINDINGS: Stable prominence of the left ventricle; no cardiomegaly. CABG changes are again noted, as  is coronary stenting in the left circulation. Chronic eventration of the right diaphragm. There is no edema, consolidation, effusion, or pneumothorax. No osseous findings to explain acute chest pain.  IMPRESSION: No active cardiopulmonary disease.   Electronically Signed   By: Jorje Guild M.D.   On: 02/08/2014 10:13     EKG Interpretation   Date/Time:  Monday February 08 2014 09:26:27 EST Ventricular Rate:  60 PR Interval:  190 QRS Duration: 177 QT Interval:  513 QTC Calculation: 513 R Axis:   98 Text Interpretation:  Sinus rhythm Left bundle branch block No significant  change since last tracing Confirmed by Maryan Rued  MD, Loree Fee ([id number]) on  02/08/2014 9:32:38 AM      MDM   Final diagnoses:  Chest pain  Unstable angina    Pt with symptoms concerning for ACS with prior sx right before he got at stent in october.  No associated symptoms.  Low risk wells. ASA and NTG given. EKG, CXR, CBC, BMP, CE, Coags pending.  10:29 AM After nitroglycerin here patient's pain is resolved. Patient had dialysis on Saturday a full course. His EKG is unchanged from prior. CBC within normal limits. I-STAT chem 8 with evidence of chronic renal insufficiency but normal potassium today. Troponin pending  11:35 AM Trop mildly elevated at 0.06.  Spoke  with cardiology for further care.  Blanchie Dessert, MD 02/08/14 1136

## 2014-02-08 NOTE — Progress Notes (Signed)
ANTICOAGULATION CONSULT NOTE - Follow Up Consult  Pharmacy Consult for heparin Indication: chest pain/ACS  Labs:  Recent Labs  02/08/14 0942 02/08/14 0950 02/08/14 1547 02/08/14 2203 02/08/14 2308  HGB 10.9* 11.6*  --   --   --   HCT 32.8* 34.0*  --   --   --   PLT 187  --   --   --   --   HEPARINUNFRC  --   --   --   --  0.34  CREATININE 4.94* 4.80*  --   --   --   TROPONINI 0.06*  --  0.16* 0.22*  --       Assessment: 74yo male therapeutic on heparin with initial dosing for CP/?USAP though at very low end of goal.  Goal of Therapy:  Heparin level 0.3-0.7 units/ml   Plan:  Will increase heparin gtt slightly to 1000 units/hr and check level in [place], PharmD, BCPS  02/08/2014,11:58 PM

## 2014-02-08 NOTE — Telephone Encounter (Signed)
Yeah - I cannot really deal with that issue on the phone & no clinic visit space available until Friday.  Leonie Man, MD

## 2014-02-09 ENCOUNTER — Encounter (HOSPITAL_COMMUNITY)
Admission: EM | Disposition: A | Discharge status: Home / Self Care | Payer: Medicare Other | Source: Home / Self Care | Attending: Cardiology

## 2014-02-09 DIAGNOSIS — Z8249 Family history of ischemic heart disease and other diseases of the circulatory system: Secondary | ICD-10-CM | POA: Diagnosis not present

## 2014-02-09 DIAGNOSIS — I447 Left bundle-branch block, unspecified: Secondary | ICD-10-CM | POA: Diagnosis present

## 2014-02-09 DIAGNOSIS — Z7902 Long term (current) use of antithrombotics/antiplatelets: Secondary | ICD-10-CM | POA: Diagnosis not present

## 2014-02-09 DIAGNOSIS — E785 Hyperlipidemia, unspecified: Secondary | ICD-10-CM | POA: Diagnosis present

## 2014-02-09 DIAGNOSIS — E119 Type 2 diabetes mellitus without complications: Secondary | ICD-10-CM | POA: Diagnosis present

## 2014-02-09 DIAGNOSIS — I214 Non-ST elevation (NSTEMI) myocardial infarction: Secondary | ICD-10-CM

## 2014-02-09 DIAGNOSIS — I25119 Atherosclerotic heart disease of native coronary artery with unspecified angina pectoris: Secondary | ICD-10-CM | POA: Insufficient documentation

## 2014-02-09 DIAGNOSIS — Z794 Long term (current) use of insulin: Secondary | ICD-10-CM | POA: Diagnosis not present

## 2014-02-09 DIAGNOSIS — N2581 Secondary hyperparathyroidism of renal origin: Secondary | ICD-10-CM | POA: Diagnosis not present

## 2014-02-09 DIAGNOSIS — Z8673 Personal history of transient ischemic attack (TIA), and cerebral infarction without residual deficits: Secondary | ICD-10-CM | POA: Diagnosis not present

## 2014-02-09 DIAGNOSIS — Z91013 Allergy to seafood: Secondary | ICD-10-CM | POA: Diagnosis not present

## 2014-02-09 DIAGNOSIS — I120 Hypertensive chronic kidney disease with stage 5 chronic kidney disease or end stage renal disease: Secondary | ICD-10-CM | POA: Diagnosis not present

## 2014-02-09 DIAGNOSIS — D631 Anemia in chronic kidney disease: Secondary | ICD-10-CM | POA: Diagnosis not present

## 2014-02-09 DIAGNOSIS — I200 Unstable angina: Secondary | ICD-10-CM | POA: Diagnosis present

## 2014-02-09 DIAGNOSIS — I25110 Atherosclerotic heart disease of native coronary artery with unstable angina pectoris: Secondary | ICD-10-CM

## 2014-02-09 DIAGNOSIS — Z7982 Long term (current) use of aspirin: Secondary | ICD-10-CM | POA: Diagnosis not present

## 2014-02-09 DIAGNOSIS — Y832 Surgical operation with anastomosis, bypass or graft as the cause of abnormal reaction of the patient, or of later complication, without mention of misadventure at the time of the procedure: Secondary | ICD-10-CM | POA: Diagnosis present

## 2014-02-09 DIAGNOSIS — D649 Anemia, unspecified: Secondary | ICD-10-CM | POA: Diagnosis present

## 2014-02-09 DIAGNOSIS — T82857A Stenosis of cardiac prosthetic devices, implants and grafts, initial encounter: Secondary | ICD-10-CM | POA: Diagnosis not present

## 2014-02-09 DIAGNOSIS — I252 Old myocardial infarction: Secondary | ICD-10-CM | POA: Diagnosis not present

## 2014-02-09 DIAGNOSIS — Z992 Dependence on renal dialysis: Secondary | ICD-10-CM | POA: Diagnosis not present

## 2014-02-09 DIAGNOSIS — I25810 Atherosclerosis of coronary artery bypass graft(s) without angina pectoris: Secondary | ICD-10-CM | POA: Diagnosis present

## 2014-02-09 DIAGNOSIS — N186 End stage renal disease: Secondary | ICD-10-CM | POA: Diagnosis not present

## 2014-02-09 DIAGNOSIS — Z833 Family history of diabetes mellitus: Secondary | ICD-10-CM | POA: Diagnosis not present

## 2014-02-09 HISTORY — PX: LEFT HEART CATHETERIZATION WITH CORONARY/GRAFT ANGIOGRAM: SHX5450

## 2014-02-09 HISTORY — PX: CORONARY ANGIOPLASTY: SHX604

## 2014-02-09 LAB — GLUCOSE, CAPILLARY
GLUCOSE-CAPILLARY: 109 mg/dL — AB (ref 70–99)
GLUCOSE-CAPILLARY: 287 mg/dL — AB (ref 70–99)
Glucose-Capillary: 109 mg/dL — ABNORMAL HIGH (ref 70–99)
Glucose-Capillary: 108 mg/dL — ABNORMAL HIGH (ref 70–99)
Glucose-Capillary: 250 mg/dL — ABNORMAL HIGH (ref 70–99)

## 2014-02-09 LAB — BASIC METABOLIC PANEL
Anion gap: 11 (ref 5–15)
BUN: 43 mg/dL — AB (ref 6–23)
CALCIUM: 8.8 mg/dL (ref 8.4–10.5)
CHLORIDE: 101 mmol/L (ref 96–112)
CO2: 24 mmol/L (ref 19–32)
Creatinine, Ser: 5.09 mg/dL — ABNORMAL HIGH (ref 0.50–1.35)
GFR calc Af Amer: 12 mL/min — ABNORMAL LOW (ref >90)
GFR, EST NON AFRICAN AMERICAN: 10 mL/min — AB (ref >90)
Glucose, Bld: 68 mg/dL — ABNORMAL LOW (ref 70–99)
POTASSIUM: 3.5 mmol/L (ref 3.5–5.1)
Sodium: 136 mmol/L (ref 135–145)

## 2014-02-09 LAB — PROTIME-INR
INR: 1.24 (ref 0.00–1.49)
PROTHROMBIN TIME: 15.7 s — AB (ref 11.6–15.2)

## 2014-02-09 LAB — CBC
HCT: 31.4 % — ABNORMAL LOW (ref 39.0–52.0)
HEMOGLOBIN: 10.6 g/dL — AB (ref 13.0–17.0)
MCH: 30.8 pg (ref 26.0–34.0)
MCHC: 33.8 g/dL (ref 30.0–36.0)
MCV: 91.3 fL (ref 78.0–100.0)
PLATELETS: 175 10*3/uL (ref 150–400)
RBC: 3.44 MIL/uL — ABNORMAL LOW (ref 4.22–5.81)
RDW: 14.5 % (ref 11.5–15.5)
WBC: 7.6 10*3/uL (ref 4.0–10.5)

## 2014-02-09 LAB — TROPONIN I: Troponin I: 0.44 ng/mL — ABNORMAL HIGH (ref <0.031)

## 2014-02-09 LAB — HEPATITIS B SURFACE ANTIGEN: Hepatitis B Surface Ag: NEGATIVE

## 2014-02-09 LAB — POCT ACTIVATED CLOTTING TIME: ACTIVATED CLOTTING TIME: 583 s

## 2014-02-09 SURGERY — LEFT HEART CATHETERIZATION WITH CORONARY/GRAFT ANGIOGRAM

## 2014-02-09 MED ORDER — SODIUM CHLORIDE 0.9 % IJ SOLN: 3.0000 mL | Freq: Two times a day (BID) | INTRAMUSCULAR | Status: DC

## 2014-02-09 MED ORDER — HEPARIN (PORCINE) IN NACL 2-0.9 UNIT/ML-% IJ SOLN
INTRAMUSCULAR | Status: AC
  Filled 2014-02-09: qty 1000

## 2014-02-09 MED ORDER — LABETALOL HCL 5 MG/ML IV SOLN: 20.0000 mg | INTRAVENOUS | Status: DC | PRN

## 2014-02-09 MED ORDER — NITROGLYCERIN 0.4 MG/SPRAY TL SOLN
Status: AC
  Filled 2014-02-09: qty 4.9

## 2014-02-09 MED ORDER — BIVALIRUDIN 250 MG IV SOLR
INTRAVENOUS | Status: AC
  Filled 2014-02-09: qty 250

## 2014-02-09 MED ORDER — LIDOCAINE HCL (PF) 1 % IJ SOLN
INTRAMUSCULAR | Status: AC
  Filled 2014-02-09: qty 30

## 2014-02-09 MED ORDER — TICAGRELOR 90 MG PO TABS
ORAL_TABLET | ORAL | Status: AC
  Administered 2014-02-10: 90 mg via ORAL
  Filled 2014-02-09: qty 1

## 2014-02-09 MED ORDER — FENTANYL CITRATE 0.05 MG/ML IJ SOLN
INTRAMUSCULAR | Status: AC
  Filled 2014-02-09: qty 2

## 2014-02-09 MED ORDER — LABETALOL HCL 5 MG/ML IV SOLN
INTRAVENOUS | Status: AC
  Filled 2014-02-09: qty 4

## 2014-02-09 MED ORDER — SODIUM CHLORIDE 0.9 % IV SOLN: 250.0000 mL | INTRAVENOUS | Status: DC | PRN

## 2014-02-09 MED ORDER — SODIUM CHLORIDE 0.9 % IV SOLN: INTRAVENOUS | Status: DC

## 2014-02-09 MED ORDER — SODIUM CHLORIDE 0.9 % IV SOLN: 100.0000 mL | INTRAVENOUS | Status: DC | PRN

## 2014-02-09 MED ORDER — DOXERCALCIFEROL 4 MCG/2ML IV SOLN: 1.0000 ug | INTRAVENOUS | Status: DC

## 2014-02-09 MED ORDER — NEPRO/CARBSTEADY PO LIQD: 237.0000 mL | ORAL | Status: DC | PRN

## 2014-02-09 MED ORDER — TICAGRELOR 90 MG PO TABS
90.0000 mg | ORAL_TABLET | Freq: Two times a day (BID) | ORAL | Status: DC
  Administered 2014-02-09 – 2014-02-10 (×2): 90 mg via ORAL
  Filled 2014-02-09 (×3): qty 1

## 2014-02-09 MED ORDER — PENTAFLUOROPROP-TETRAFLUOROETH EX AERO: 1.0000 "application " | INHALATION_SPRAY | CUTANEOUS | Status: DC | PRN

## 2014-02-09 MED ORDER — DOXERCALCIFEROL 4 MCG/2ML IV SOLN
INTRAVENOUS | Status: AC
  Filled 2014-02-09: qty 2

## 2014-02-09 MED ORDER — LIDOCAINE HCL (PF) 1 % IJ SOLN: 5.0000 mL | INTRAMUSCULAR | Status: DC | PRN

## 2014-02-09 MED ORDER — LIDOCAINE-PRILOCAINE 2.5-2.5 % EX CREA: 1.0000 "application " | TOPICAL_CREAM | CUTANEOUS | Status: DC | PRN

## 2014-02-09 MED ORDER — NITROGLYCERIN 0.4 MG SL SUBL: 0.4000 mg | SUBLINGUAL_TABLET | SUBLINGUAL | Status: DC | PRN

## 2014-02-09 MED ORDER — SODIUM CHLORIDE 0.9 % IJ SOLN: 3.0000 mL | INTRAMUSCULAR | Status: DC | PRN

## 2014-02-09 MED ORDER — HEPARIN SODIUM (PORCINE) 1000 UNIT/ML DIALYSIS
1000.0000 [IU] | INTRAMUSCULAR | Status: DC | PRN
  Filled 2014-02-09: qty 1

## 2014-02-09 MED ORDER — MIDAZOLAM HCL 2 MG/2ML IJ SOLN
INTRAMUSCULAR | Status: AC
  Filled 2014-02-09: qty 2

## 2014-02-09 MED ORDER — NITROGLYCERIN 0.4 MG SL SUBL
SUBLINGUAL_TABLET | SUBLINGUAL | Status: AC
  Administered 2014-02-09: 0.4 mg via SUBLINGUAL
  Filled 2014-02-09: qty 2.5

## 2014-02-09 MED ORDER — DOXERCALCIFEROL 4 MCG/2ML IV SOLN
1.0000 ug | INTRAVENOUS | Status: DC
  Administered 2014-02-09: 1 ug via INTRAVENOUS
  Filled 2014-02-09: qty 2

## 2014-02-09 MED ORDER — NITROGLYCERIN 0.4 MG SL SUBL
SUBLINGUAL_TABLET | SUBLINGUAL | Status: AC
  Administered 2014-02-09: 0.4 mg
  Filled 2014-02-09: qty 2.5

## 2014-02-09 MED ORDER — ALTEPLASE 2 MG IJ SOLR
2.0000 mg | Freq: Once | INTRAMUSCULAR | Status: DC | PRN
  Filled 2014-02-09: qty 2

## 2014-02-09 MED ORDER — MORPHINE SULFATE 2 MG/ML IJ SOLN: 2.0000 mg | INTRAMUSCULAR | Status: DC | PRN

## 2014-02-09 NOTE — Procedures (Signed)
Tolerating hemodialysis at this time with no instability.  He is s/p PTA of coronary artery today. Alfonzia Woolum C

## 2014-02-09 NOTE — Progress Notes (Signed)
PAtient c/o chest pain 5/10 mid chest radiating to neck area,nitro.4mg  given

## 2014-02-09 NOTE — Brief Op Note (Signed)
    02/08/2014 - 02/09/2014  11:24 AM  PATIENT:  [name]  74 y.o. male with known CAD - Redo CABG (remaining grafts LIMA-LAD, SVG-OM1, SVG-OM2) with multiple PCI to native RCA (last in 10/2013). Has had trouble with RCA stent ISR.  Is ESRD on HD. Has chronic ClassII-III  PRE-OPERATIVE DIAGNOSIS:  NSTEMI, Known CAD-CABG  POST-OPERATIVE DIAGNOSIS:    SEVERE MID RCA ISR - 99% --> SUCCESSFUL DIFFICULT PTCA (EXPANDED TO 3.5 MM  Known occluded Native LCA with widely patent LIMA-LAD, SVG-OM1 and SVG-OM2  Severe Systemic Hypertension: 165/51/90 mmHg  PROCEDURE:  Procedure(s): LEFT HEART CATHETERIZATION WITH CORONARY/GRAFT ANGIOGRAM (N/A)   5 Fr RFA access - Mod Seldinger under flouro --> JR4 for RCA & SVG Angio, IMA for LIMA angio (advanced over J wire & flouro), LCA not imaged b/c known occlusion.  AoV Not crossed Cutting Balloon PTCA of 99% ISR in mid RCA  Guide: AL 0.75, Luge wire, Guideliner Catheter;  [place] 2.0 mm x 12 mm (to advance Guideliner): .  8 Atm x 20 Sec  BS Flextome Cutting Balloon 2.75 mm x 10 mm:  14 Atm x 30 Sec  [place] Emerge 3.0 mm x 12 mm  16 Atm x 600 Sec  [place] Emerge 3.5 mm x 6 mm  14 Atm x 60 Sec   SURGEON:  Surgeon(s) and Role:    * Leonie Man, MD - Primary  PHYSICIAN ASSISTANT:   ASSISTANTS: Ricki Rodriguez, RT   ANESTHESIA:   local and IV sedation; 12 ml Lidocaine, 1 Versed, 100 mcg Fentanyl  EBL:    < 20 ml  6 Fr RFA Sheath: sutured in place to be pulled in PACU/post procedure unit with manual pressure.   MEDICATIONS USED:    Brilinta 90 mg PO  Angiomax Bolus & gtt  IV Labetalol 20 mg x 1  SL NTG 0.4 mg x 2  Omnipaque Contrast 125 ml  DICTATION: .Note written in EPIC  PLAN OF CARE: Post Procedure unit.  BP control.  Change antiplatelet x Brilinta; Needs HD; Expect d/c tomorrow  PATIENT DISPOSITION:  PACU - hemodynamically stable.     Leonie Man, M.D., M.S. Interventional Cardiologist   Pager #  [phone]

## 2014-02-09 NOTE — Progress Notes (Signed)
UR completed 

## 2014-02-09 NOTE — Consult Note (Signed)
[facility] Renal Consultation Note    Indication for Consultation:  Management of ESRD/hemodialysis; anemia, hypertension/volume and secondary hyperparathyroidism  HPI: [name] [name] is a 74 y.o. male with ESRD (TTS [place]) on hemodialysis secondary to HD since 10/2010, HTN CAD s/p CABG 1992, redo 2006, NSTEMI sp multiple stents, most recently 10/2013, hx CVA 1998, colon cancer with bowel resection 1998 fairly stable on HD but with onging cardiac issues with recurrent CP had _0 /2012: Non-STEMI: PCI to SVG-OM1 Integrity BMS 3.5 mm x 15 mm (3.85 mm);; 02/2013: PCI to mid RCA Xience Apling DES 2.75 mm x 15 mm (3.1 mm) ;; 04/2013 : dRCA  Integrity  Resolute DES   2.25 mm x 45m (2.5 mm);; 10/'15: PCI to mRCA ISR w/ post stent lesion - Promus P 2.75 m x 16 Atm (3.25 mm)  . History of nuclear stress test Sept 2014    EF 39% (down from) 45%, no ischemia; but now the inferobasal perfusion defect is determined to be likley prior infarction (as opposed to diaphragmatic attenuation)  . Echocardiogram findings abnormal, without diagnosis [id number] Oct 2014    EF 50-55%, anteroseptal hypokinesis. Mild to moderate concentric LVH. Mild MR, aortic sclerosis. Mild atrial dilatation bilaterally.; Oct - mild LVH, EF ~50-55%, incoordinate septal WM, - with no other notable WMA  . History of Non-ST elevated myocardial infarction (non-STEMI) 1992, 2006, 2012; 02/2013  . Peptic ulcer disease  2004  . BPH (benign prostatic hyperplasia)   . Chronic low back pain    Past Surgical History  Procedure Laterality Date  . Appendectomy    . Cholecystectomy  2009  . Eye surgery      bilateral cataracts   . Spinal fusion    . Av fistula repair Left     5/11  . Colon resection      transverse and proximal descending w/ primar anastomosis  . Doppler echocardiography  01/25/2012    EF 50 to 55%  . Nm myocar perf  wall motion  10/12/2011    EF 54%,LV normal ; no signifiant ischemia  . Cardiac catheterization  10/16/2010    patent  LIMA to the LAD , PATENT  svg TO 2nd marginals ,occluded PLA with collaterals and SVG to the OM  had 90% stenosis  was txlge  bare - metal  3.5  Integrity ten  postdilate  36-37  mm    . Cardiac catheterization  03/22/2004    loss of both SVG,severe disease in prox and ostial  circ not amenable to invention. mod diease distal LAD  AFTER BYPASS GRAFT;-CVTS to evaluate   . Coronary angioplasty  04/03/1995    OM and Circ  . Coronary angioplasty  02/01/2000    CIRC  . Carotid doppler  05/23/2012    ABN CAROTID-- RGT BULB/PROX ICA mild to mod 50-60%;lft bulb/prox mild to mod 0-49%;left subclavian abn waveforms consistent with patients lft arm  A/V fistula  . Event monitor  01/23/2012-02/06/2012    SINUS ,LBBB,unifocal PVCs  . Coronary artery bypass graft  08/22/1990    INITIAL:LIMA to LAD, SVG to  Diagonal, SVG to OM  . Coronary artery bypass graft  2006    SVG to OM1,SVG to OM2 with patent LIMA  to LAD and occluded vein  graft to the diagonal  and occluded vein grft to OM from  prior  surgery  . Percutaneous coronary stent intervention (pci-s)  02/23/2013    mid RCA 80% & 70% - Xience 2.75 mm x 15 mm ( 3.17m) ; LIMA-LAD patent, SVG-OM patent (stent patent); SVG-Diag patent.  . Transthoracic echocardiogram  02/23/2013    Moderate concentric hypertrophy. EF 4500%. Septal bounce. Grade 2 diastolic dysfunction (pseudo-normal - severely elevated filling pressures) mild to moderate MR and moderate LA dilation to  . Percutaneous coronary stent intervention (pci-s)  April 2015    dRCA - Integrity Resolute DES   2.25 mm x 861m(2.5 mm)  . Percutaneous coronary stent intervention (pci-s)  Oct 15 2013    [place] Angina: mRCA ISR with post-stent stenosis -- Cuting PTCA & PCI Promus Premier DES 2.75 mm x 16 mm (3.25 mm)  . Lower extremity arterial dopplers  October 2014    Calcified but non-occlusive peripheral arteries. No evidence of stenosis  . Left heart catheterization with coronary angiogram N/A 02/23/2013    Procedure: LEFT HEART CATHETERIZATION WITH CORONARY ANGIOGRAM;  Surgeon: JoLorretta HarpMD;  [facility];  Service: Cardiovascular;  Laterality: N/A;  . Left heart catheterization with coronary/graft angiogram N/A 04/30/2013    Procedure: LEFT HEART CATHETERIZATION WITH COBeatrix Fetters Surgeon: DaLeonie ManMD;  [facility];  Service: Cardiovascular;  Laterality: N/A;  . Left heart catheterization with coronary/graft angiogram N/A 10/15/2013    Procedure: LEFT HEART CATHETERIZATION WITH COBeatrix Fetters Surgeon: DaLeonie ManMD;  [facility];  Service: Cardiovascular;   Laterality: N/A;  . Colon surgery     Family History  Problem Relation Age of Onset  . Heart disease Mother   . Hypertension Mother   . Diabetes Mother   . Heart disease Father   . Hypertension Father   . Diabetes Father    Social History:  reports that he has never smoked. He has never used smokeless tobacco. He reports that he does not drink alcohol or use illicit drugs. Allergies  Allergen Reactions  . Shellfish Allergy     Causes gout flare-ups   Prior to Admission medications  Medication Sig Start Date End Date Taking? Authorizing Provider  allopurinol (ZYLOPRIM) 100 MG tablet Take 100 mg by mouth daily.   Yes Historical Provider, MD  aspirin 81 MG chewable tablet Chew 81 mg by mouth at bedtime.   Yes Historical Provider, MD  atorvastatin (LIPITOR) 40 MG tablet Take 40 mg by mouth at bedtime.   Yes Historical Provider, MD  Besifloxacin HCl (BESIVANCE) 0.6 % SUSP Place 1 drop into both eyes See admin instructions. Use eye drops 4 times daily for 2 days following injection by Dr. Zigmund Daniel (once a month)   Yes Historical Provider, MD  carvedilol (COREG) 25 MG tablet Take 25 mg by mouth 2 (two) times daily with a meal.   Yes Historical Provider, MD  clopidogrel (PLAVIX) 75 MG tablet Take 1 tablet (75 mg total) by mouth daily. 10/16/13  Yes Rhonda G Barrett, PA-C  glucose blood (PRODIGY NO CODING BLOOD GLUC) test strip Test as directed 12/17/11  Yes Midge Minium, MD  insulin NPH Human (HUMULIN N,NOVOLIN N) 100 UNIT/ML injection Inject 10-20 Units into the skin 2 (two) times daily. Take 10 units if blood sugar is 130-150.  Take 20 units if blood sugar is >150.   Yes Historical Provider, MD  isosorbide mononitrate (IMDUR) 60 MG 24 hr tablet Take 60 mg by mouth 2 (two) times daily. 07/15/13  Yes Midge Minium, MD  levothyroxine (SYNTHROID, LEVOTHROID) 125 MCG tablet Take 1 tablet (125 mcg total) by mouth daily before breakfast. 11/06/13  Yes Midge Minium, MD   lidocaine-prilocaine (EMLA) cream Apply 1 application topically daily as needed (prior to dialysis treatments).    Yes Historical Provider, MD  loperamide (IMODIUM) 2 MG capsule Take 2 mg by mouth daily as needed for diarrhea or loose stools.    Yes Historical Provider, MD  nitroGLYCERIN (NITROSTAT) 0.4 MG SL tablet Place 1 tablet (0.4 mg total) under the tongue every 5 (five) minutes as needed for chest pain. 11/18/13  Yes Midge Minium, MD  sevelamer carbonate (RENVELA) 800 MG tablet Take 3(three)  86m tablets by mouth with every meal.   Yes Historical Provider, MD  terazosin (HYTRIN) 2 MG capsule Take 1 capsule (2 mg total) by mouth at bedtime. 11/06/13  Yes KMidge Minium MD  trimethoprim-polymyxin b (POLYTRIM) ophthalmic solution Place 1 drop into both eyes as needed.  09/21/13  Yes Historical Provider, MD  multivitamin (RENA-VIT) TABS tablet Take 1 tablet by mouth daily. Patient not taking: Reported on 02/08/2014 12/17/11   KMidge Minium MD   Current Facility-Administered Medications  Medication Dose Route Frequency Provider Last Rate Last Dose  . 0.9 %  sodium chloride infusion  250 mL Intravenous PRN HAlmyra Deforest PA      . [MAR Hold] acetaminophen (TYLENOL) tablet 650 mg  650 mg Oral Q4H PRN HAlmyra Deforest PA      . [MAR Hold] allopurinol (ZYLOPRIM) tablet 100 mg  100 mg Oral Daily [place]     . [MAR Hold] aspirin chewable tablet 81 mg  81 mg Oral QHS HAlmyra Deforest PA   81 mg at 02/08/14 2238  . [MAR Hold] atorvastatin (LIPITOR) tablet 40 mg  40 mg Oral QHS HAlmyra Deforest PA   40 mg at 02/08/14 2238  . [MAR Hold] carvedilol (COREG) tablet 25 mg  25 mg Oral BID WC HAlmyra Deforest PA   25 mg at 02/09/14 08416 . [MAR Hold] clopidogrel (PLAVIX) tablet 75 mg  75 mg Oral Daily  Almyra Deforest, [place]      . heparin ADULT infusion 100 units/mL (25000 units/250 mL)  1,000 Units/hr Intravenous Continuous Rogue Bussing, [facility]   Stopped at 02/09/14 [phone]  . [MAR Hold] insulin NPH Human (HUMULIN N,NOVOLIN N)  injection 10-20 Units  10-20 Units Subcutaneous BID Dorothy Spark, MD   10 Units at 02/08/14 2247  . [MAR Hold] isosorbide mononitrate (IMDUR) 24 hr tablet 60 mg  60 mg Oral BID Almyra Deforest, [place]      . Doug Sou Hold] levothyroxine (SYNTHROID, LEVOTHROID) tablet 125 mcg  125 mcg Oral QAC breakfast Almyra Deforest, [place]   125 mcg at 02/09/14 0553  . [MAR Hold] lidocaine-prilocaine (EMLA) cream 1 application  1 application Topical Daily PRN Almyra Deforest, PA      . Doug Sou Hold] loperamide (IMODIUM) capsule 2 mg  2 mg Oral Daily PRN Almyra Deforest, PA      . Doug Sou Hold] multivitamin (RENA-VIT) tablet 1 tablet  1 tablet Oral QHS Almyra Deforest, PA   1 tablet at 02/08/14 2238  . [MAR Hold] nitroGLYCERIN (NITROSTAT) SL tablet 0.4 mg  0.4 mg Sublingual Q5 min PRN Rogelia Mire, NP      . Doug Sou Hold] ondansetron [facility]) injection 4 mg  4 mg Intravenous Q6H PRN Almyra Deforest, PA      . Doug Sou Hold] sevelamer carbonate (RENVELA) tablet 1,600 mg  1,600 mg Oral TID WC Almyra Deforest, PA   1,600 mg at 02/08/14 1854  . sodium chloride 0.9 % injection 3 mL  3 mL Intravenous Q12H Almyra Deforest, PA   3 mL at 02/08/14 2239  . sodium chloride 0.9 % injection 3 mL  3 mL Intravenous PRN Almyra Deforest, PA      . Doug Sou Hold] terazosin (HYTRIN) capsule 2 mg  2 mg Oral QHS Almyra Deforest, PA   2 mg at 02/08/14 2239   Labs: Basic Metabolic Panel:  Recent Labs Lab 02/08/14 0942 02/08/14 0950 02/09/14 0400  NA 138 137 136  K 4.0 4.2 3.5  CL 97 96 101  CO2 29  --  24  GLUCOSE 159* 157* 68*  BUN 33* 34* 43*  CREATININE 4.94* 4.80* 5.09*  CALCIUM 9.7  --  8.8   Liver Function Tests:  Recent Labs Lab 02/08/14 0942  AST 21  ALT 17  ALKPHOS 79  BILITOT 0.7  PROT 6.6  ALBUMIN 3.9  CBC:  Recent Labs Lab 02/08/14 0942 02/08/14 0950 02/09/14 0400  WBC 8.4  --  7.6  NEUTROABS 6.8  --   --   HGB 10.9* 11.6* 10.6*  HCT 32.8* 34.0* 31.4*  MCV 90.9  --  91.3  PLT 187  --  175   Cardiac Enzymes:  Recent Labs Lab 02/08/14 0942 02/08/14 1547 02/08/14 2203  02/09/14 0400  TROPONINI 0.06* 0.16* 0.22* 0.44*   CBG:  Recent Labs Lab 02/08/14 2243 02/09/14 0827 02/09/14 1224  GLUCAP 157* 109* 109*   Studies/Results: Dg Chest 2 View  02/08/2014   CLINICAL DATA:  Chest pain starting last night.  History of CABG  EXAM: CHEST  2 VIEW  COMPARISON:  04/13/2013  FINDINGS: Stable prominence of the left ventricle; no cardiomegaly. CABG changes are again noted, as is coronary stenting in the left circulation. Chronic eventration of the right diaphragm. There is no edema, consolidation, effusion, or pneumothorax. No osseous findings to explain acute chest pain.  IMPRESSION: No active cardiopulmonary disease.   Electronically Signed   By: Gilford Silvius.D.  On: 02/08/2014 10:13   ROS: As per HPI otherwise negative.  Physical Exam: Filed Vitals:   02/09/14 0921 02/09/14 1004 02/09/14 1200 02/09/14 1214  BP: 127/48  155/46 146/46  Pulse: 53 64 49   Temp:      TempSrc:      Resp:   11   Height:      Weight:      SpO2:   96%      General: Well developed, well nourished, in no acute distress - seen on HD post cath Head: Normocephalic, atraumatic, sclera non-icteric, mucus membranes are moist Neck: Supple. JVD not elevated. Lungs: Clear bilaterally to auscultation without wheezes, rales, or rhonchi. Breathing is unlabored. Heart: RRR  Abdomen: Soft, non-tender, non-distended + BS Lower extremities:without edema or ischemic changes, no open wounds  Neuro: Alert and oriented X 3. Moves all extremities spontaneously. Dialysis Access: left upper AVF Qb 350  Dialysis Orders: [facility]: [facility] 4[facility] 160 2K 2.25 Ca profile 4 left upper AVF 350/800 ARanesp 25 q 2 weeks - last 2/1 Hectorol 1 heparin 4000  Assessment/Plan: 1. NSTEMI , CAD s/p CABG - s/p PTCA today, cards anticipates d/c Wed.per pt, MD considering change of plavix to alternative med  2.  ESRD -  TTS - HD today per routine  3.  Hypertension/volume  - CXR clear, BP control with meds and  volume reduction - favor lower edw to 73 at discharge  4.  Anemia  - hgb 10.6 No ESA - anticipate d/c Wed and ARanesp will be redosed 2/4 per routine 5.  Metabolic bone disease -  Continue Hectorol 6.  Nutrition - still npo post cath - advance as tolerated  Myriam Jacobson, PA-C [place] [phone] 02/09/2014, 2:02 PM   Renal Attending: Unstable again was reason for admission and he underwent PTA of a coronary artery today and is pain free. For routine hemodialysis today for maintenance. Trenise Turay C

## 2014-02-09 NOTE — Interval H&P Note (Signed)
History and Physical Interval Note:  02/09/2014 10:13 AM  [name] [name]  has presented today for surgery, with the diagnosis of NSTEMI with known CAD-CABG & PCI. The various methods of treatment have been discussed with the patient and family. After consideration of risks, benefits and other options for treatment, the patient has consented to  Procedure(s): LEFT HEART CATHETERIZATION WITH CORONARY/GRAFT ANGIOGRAM (N/A) +/- PCI  as a surgical intervention .  The patient's history has been reviewed, patient examined, no change in status, stable for surgery.  I have reviewed the patient's chart and labs.  Questions were answered to the patient's satisfaction.    Cath Lab Visit (complete for each Cath Lab visit)  Clinical Evaluation Leading to the Procedure:   ACS: Yes.    Non-ACS:    Anginal Classification: CCS IV  Anti-ischemic medical therapy: Maximal Therapy (2 or more classes of medications)  Non-Invasive Test Results: No non-invasive testing performed  Prior CABG: Previous CABG        [name] [name]

## 2014-02-09 NOTE — Procedures (Addendum)
Order for sheath removal verified per post procedural orders. Procedure explained to patient and Rt femoral artery access site assessed: level 0, palpable dorsalis pedis and posterior tibial pulses. 6 French Sheath removed and manual pressure applied for 30 minutes. Pre, peri, & post procedural vitals: HR60, RR 12, O2 Sat upper 96, BP 185/78, Sheath pull started @ 1345 , Pain  Level 0. Distal pulses RDP faint but present  remained intact after sheath removal. Access site level 0 and dressed with 4X4 gauze and tegaderm.  Post procedural instructions discussed with return demonstration from patient.

## 2014-02-09 NOTE — Care Management Note (Signed)
    Page 1 of 1   02/09/2014     5:22:40 PM CARE MANAGEMENT NOTE 02/09/2014  Patient:  LAFRANCE, CRISTIAN   Account Number:  [id number]  Date Initiated:  02/09/2014  Documentation initiated by:  Lerin Jech  Subjective/Objective Assessment:   Pt s/p PTCA today.     Action/Plan:   Pt in HD presently ; left Brilinta booklet at bedside. Will follow up in am.   Anticipated DC Date:  02/10/2014   Anticipated DC Plan:  [place]  CM consult  Medication Assistance      Choice offered to / List presented to:             Status of service:  Completed, signed off Medicare Important Message given?   (If response is "NO", the following Medicare IM given date fields will be blank) Date Medicare IM given:   Medicare IM given by:   Date Additional Medicare IM given:   Additional Medicare IM given by:    Discharge Disposition:  HOME/SELF CARE  Per UR Regulation:  Reviewed for med. necessity/level of care/duration of stay  If discussed at [place] of Stay Meetings, dates discussed:    Comments:

## 2014-02-10 ENCOUNTER — Encounter (HOSPITAL_COMMUNITY): Payer: Self-pay | Admitting: Cardiology

## 2014-02-10 LAB — CBC
HEMATOCRIT: 31.7 % — AB (ref 39.0–52.0)
Hemoglobin: 10.5 g/dL — ABNORMAL LOW (ref 13.0–17.0)
MCH: 30.3 pg (ref 26.0–34.0)
MCHC: 33.1 g/dL (ref 30.0–36.0)
MCV: 91.4 fL (ref 78.0–100.0)
Platelets: 187 10*3/uL (ref 150–400)
RBC: 3.47 MIL/uL — ABNORMAL LOW (ref 4.22–5.81)
RDW: 14.5 % (ref 11.5–15.5)
WBC: 8.5 10*3/uL (ref 4.0–10.5)

## 2014-02-10 LAB — BASIC METABOLIC PANEL
ANION GAP: 10 (ref 5–15)
BUN: 28 mg/dL — AB (ref 6–23)
CO2: 30 mmol/L (ref 19–32)
CREATININE: 4.06 mg/dL — AB (ref 0.50–1.35)
Calcium: 8.8 mg/dL (ref 8.4–10.5)
Chloride: 99 mmol/L (ref 96–112)
GFR calc non Af Amer: 13 mL/min — ABNORMAL LOW (ref >90)
GFR, EST AFRICAN AMERICAN: 16 mL/min — AB (ref >90)
Glucose, Bld: 153 mg/dL — ABNORMAL HIGH (ref 70–99)
POTASSIUM: 3.7 mmol/L (ref 3.5–5.1)
Sodium: 139 mmol/L (ref 135–145)

## 2014-02-10 LAB — GLUCOSE, CAPILLARY: GLUCOSE-CAPILLARY: 110 mg/dL — AB (ref 70–99)

## 2014-02-10 MED ORDER — TICAGRELOR 90 MG PO TABS: 90.0000 mg | ORAL_TABLET | Freq: Two times a day (BID) | ORAL | Status: DC

## 2014-02-10 MED FILL — Sodium Chloride IV Soln 0.9%: INTRAVENOUS | Qty: 50 | Status: AC

## 2014-02-10 NOTE — Progress Notes (Signed)
Subjective: Feels like he can not catch his breath sometimes. This started before the brilinta was added.  Objective: Vital signs in last 24 hours: Temp:  [97.3 F (36.3 C)-98.2 F (36.8 C)] 97.8 F (36.6 C) (02/03 0458) Pulse Rate:  [49-71] 71 (02/03 0458) Resp:  [11-18] 18 (02/03 0458) BP: (97-182)/(30-67) 129/42 mmHg (02/03 0458) SpO2:  [96 %-100 %] 100 % (02/02 1931) Weight:  [162 lb 4.1 oz (73.6 kg)-166 lb 10.7 oz (75.6 kg)] 162 lb 4.1 oz (73.6 kg) (02/02 1928) Last BM Date: 02/08/14  Intake/Output from previous day: 02/02 0701 - 02/03 0700 In: 300 [P.O.:300] Out: 2250 [Urine:250] Intake/Output this shift: Total I/O In: 60 [P.O.:60] Out: 2250 [Urine:250; Other:2000]  Medications Current Facility-Administered Medications  Medication Dose Route Frequency Provider Last Rate Last Dose  . 0.9 %  sodium chloride infusion   Intravenous Continuous Leonie Man, MD      . 0.9 %  sodium chloride infusion  250 mL Intravenous PRN Leonie Man, MD      . acetaminophen (TYLENOL) tablet 650 mg  650 mg Oral Q4H PRN Almyra Deforest, PA      . allopurinol (ZYLOPRIM) tablet 100 mg  100 mg Oral Daily Almyra Deforest, [place]      . aspirin chewable tablet 81 mg  81 mg Oral QHS Almyra Deforest, PA   81 mg at 02/09/14 2311  . atorvastatin (LIPITOR) tablet 40 mg  40 mg Oral QHS Almyra Deforest, PA   40 mg at 02/09/14 2311  . carvedilol (COREG) tablet 25 mg  25 mg Oral BID WC Almyra Deforest, PA   25 mg at 02/09/14 [id number]  . doxercalciferol (HECTOROL) injection 1 mcg  1 mcg Intravenous Q T,Th,Sa-HD Myriam Jacobson, PA-C   1 mcg at 02/09/14 1655  . insulin NPH Human (HUMULIN N,NOVOLIN N) injection 10-20 Units  10-20 Units Subcutaneous BID Dorothy Spark, MD   10 Units at 02/09/14 2319  . isosorbide mononitrate (IMDUR) 24 hr tablet 60 mg  60 mg Oral BID Almyra Deforest, PA   60 mg at 02/09/14 2311  . labetalol (NORMODYNE,TRANDATE) injection 20 mg  20 mg Intravenous Q10 min PRN Leonie Man, MD      . levothyroxine (SYNTHROID,  LEVOTHROID) tablet 125 mcg  125 mcg Oral QAC breakfast Almyra Deforest, [place]   125 mcg at 02/09/14 0553  . lidocaine-prilocaine (EMLA) cream 1 application  1 application Topical Daily PRN Almyra Deforest, PA      . loperamide (IMODIUM) capsule 2 mg  2 mg Oral Daily PRN Almyra Deforest, PA      . morphine 2 MG/ML injection 2 mg  2 mg Intravenous Q1H PRN Leonie Man, MD      . multivitamin (RENA-VIT) tablet 1 tablet  1 tablet Oral QHS Almyra Deforest, PA   1 tablet at 02/08/14 2238  . nitroGLYCERIN (NITROSTAT) SL tablet 0.4 mg  0.4 mg Sublingual Q5 min PRN Rogelia Mire, NP      . ondansetron [facility]) injection 4 mg  4 mg Intravenous Q6H PRN Almyra Deforest, PA      . sevelamer carbonate (RENVELA) tablet 1,600 mg  1,600 mg Oral TID WC Almyra Deforest, PA   1,600 mg at 02/08/14 1854  . sodium chloride 0.9 % injection 3 mL  3 mL Intravenous Q12H Leonie Man, MD      . sodium chloride 0.9 % injection 3 mL  3 mL Intravenous PRN Leonie Man, MD      .  terazosin (HYTRIN) capsule 2 mg  2 mg Oral QHS Almyra Deforest, PA   2 mg at 02/09/14 2311  . ticagrelor (BRILINTA) tablet 90 mg  90 mg Oral BID Leonie Man, MD   90 mg at 02/09/14 2312    PE: General appearance: alert, cooperative and no distress Lungs: clear to auscultation bilaterally Heart: regular rate and rhythm and 1/6 sys MM louder toward the apex. Extremities: No LEE Pulses: 2+ DPs Skin: Warm and dry.  Right groin:  mildly tender.  No hematoma of ecchymosis. Neurologic: Grossly normal  Lab Results:   Recent Labs  02/08/14 0942 02/08/14 0950 02/09/14 0400 02/10/14 0423  WBC 8.4  --  7.6 8.5  HGB 10.9* 11.6* 10.6* 10.5*  HCT 32.8* 34.0* 31.4* 31.7*  PLT 187  --  175 187   BMET  Recent Labs  02/08/14 0942 02/08/14 0950 02/09/14 0400 02/10/14 0423  NA 138 137 136 139  K 4.0 4.2 3.5 3.7  CL 97 96 101 99  CO2 29  --  24 30  GLUCOSE 159* 157* 68* 153*  BUN 33* 34* 43* 28*  CREATININE 4.94* 4.80* 5.09* 4.06*  CALCIUM 9.7  --  8.8 8.8    PT/INR  Recent Labs  02/09/14 1600  LABPROT 15.7*  INR 1.24    Assessment/Plan  Active Problems:   Unstable angina - crescendo class 3-4 angina now at rest   NSTEMI (non-ST elevated myocardial infarction)   Coronary artery disease involving coronary bypass graft of native heart with unstable angina pectoris   HTN   ESRD   HLD   DM   LBBB, chronic   SP left heart cath revealing SEVERE MID RCA ISR - 99%  He underwent successful PTCA -EXPANDED TO 3.5 MM Known occluded Native LCA with widely patent LIMA-LAD, SVG-OM1 and SVG-OM2.  Changed to brilinta. ASA, coreg 25, imdur 60bid.  BP improved.   Underwent HD yesterday with 2050ml removed.  Ambulate with cardiac rehab and then DC home.    LOS: 2 days    HAGER, BRYAN PA-C 02/10/2014 6:30 AM  Patient seen, examined. Available data reviewed. Agree with findings, assessment, and plan as outlined by Tarri Fuller, PA-C. Exam shows clear groin site. Heart RRR without murmur or gallop. No edema. Reviewed meds and they are appropriate with ASA, brilinta, and a statin drug. I'm not sure he will tolerate brilinta secondary to breathlessness, but he is willing to try since he's had so many coronary events. He is scheduled to follow-up with Dr Ellyn Hack this Friday.  Sherren Mocha, M.D. 02/10/2014 8:33 AM

## 2014-02-10 NOTE — Progress Notes (Signed)
CARDIAC REHAB PHASE I   PRE:  Rate/Rhythm: 58 SB    BP: sitting 105/36    SaO2:   MODE:  Ambulation: 1000 ft   POST:  Rate/Rhythm: 73 SR    BP: sitting 127/52     SaO2:   Tolerated well. C/o knee and back and feet pain with distance. Sts he can't do much ex. Frustrated by recurrent ISR. Reviewed importance of Brilinta and NTG. Not interested in CRPII, has done before.  [id number]   Darrick Meigs CES, ACSM 02/10/2014 8:39 AM

## 2014-02-10 NOTE — Progress Notes (Signed)
IV's and tele removed, pt dressed. Took Brilinta but refused other meds, said he'd take at home.  Refused insulin stating BS too low at 111 so he doesn't need it today.  Rt groin level 0.  Reviewed d/c instructions with emphasis on Brilinta compliance and twice daily dosing.  Pt ambulated to front door, refused w/c.

## 2014-02-10 NOTE — Discharge Summary (Signed)
Physician Discharge Summary      Cardiologist:  Ellyn Hack Patient ID: [name] [name] MRN: [id number] DOB/AGE: 13-Jul-1940 74 y.o.  Admit date: 02/08/2014 Discharge date: 02/10/2014  Admission Diagnoses:   Unstable angina  Discharge Diagnoses:  Active Problems:   Unstable angina - crescendo class 3-4 angina now at rest   NSTEMI (non-ST elevated myocardial infarction)   Coronary artery disease involving coronary bypass graft of native heart with unstable angina pectoris   HTN  ESRD  HLD  DM  LBBB, chronic  Discharged Condition: stable  Hospital Course:   The patient is a pleasant 74 year old Caucasian male with PMH of HTN, HLD, DM, hypothyroidism, history of colon CA s/p colectomy, chronic LBBB and ESRD on HD T/Th/Sat, history of CAD status post CABG 2 and multiple PCI. According to the previous record, he initially underwent CABG in 1992. He had redo CABG in 2006 after he was found to have occluded SVG to OM, previously known occlusion of SVG to diagonal. In 2012, he was noted to have severe lesion in redo SVG to OM1 s/p BMS. During the last year alone, he had 3 cardiac catheterization, in February 2015, April and October 2015. He received a PCI to mid RCA in February 2015, EF 45-50% on echo during that admission. He came back and got a DES to distal RCA in April. He had in-stent restenosis of mid RCA stenosis in October 2015 after referred by his nephrologist for progressive angina. The last time he was seen by Dr. Ellyn Hack was in November 2015, at which time he states he had occasional anginal like symptom with heavy exertion, however has largely been chest pain-free. According to the patient, he has been having intermittent chest discomfort since late November last year. He states he has been compliant with the DAPT medication at home. His chest discomfort occur both at rest and with exertion. He described as a pressure-like sensation radiating to bilateral neck and also left shoulder. He  denies any associated symptoms like shortness of breath or diaphoresis. The only alleviating factor is nitroglycerin, there has been no exacerbating factor.  He woke up around 12:30 AM in the morning of 02/08/2014 with chest pain. The chest pain resolved after taking nitroglycerin, however recurred 2 hours later. Since this morning, he has taken multiple nitroglycerin for recurrent chest pain happening every 2 hours. Per record, patient attempted to call cardiology office and was referred to [facility] for further evaluation. On arrival his blood pressure was 173/60. O2 saturation 98% on room air. Significant laboratory finding include sodium 138, potassium 4.0, creatinine 4.94, troponin 0.06, hemoglobin 10.9. Chest x-ray was negative for acute etiology. EKG showed a chronic left bundle branch block unchanged compared to the previous EKG. Cardiology has been consulted for chest pain.  He was admitted.  P2Y12 was 264. Plavix DCd.  IV heparin added.  He underwent LHC revealing SEVERE MID RCA ISR - 99%. He underwent successful PTCA -EXPANDED TO 3.5 MM.  Known occluded Native LCA with widely patent LIMA-LAD, SVG-OM1 and SVG-OM2. Changed to brilinta. ASA, coreg 25, imdur 60bid. BP improved.Underwent HD post cath with 2044m removed.  Right groin was clear and stable.  He does report having difficulty catching his breath at times which started before brilinta was added.   The patient was seen by Dr. CBurt Knackwho felt he was stable for DC home.   Consults: Cardiac rehab, Nephrology, Pharmacy  Significant Diagnostic Studies:    02/08/2014 - 02/09/2014  11:24 AM  PATIENT:  [name] [name] 74 y.o. male with known CAD - Redo CABG (remaining grafts LIMA-LAD, SVG-OM1, SVG-OM2) with multiple PCI to native RCA (last in 10/2013). Has had trouble with RCA stent ISR. Is ESRD on HD. Has chronic ClassII-III  PRE-OPERATIVE DIAGNOSIS: NSTEMI, Known CAD-CABG  POST-OPERATIVE DIAGNOSIS:   SEVERE MID RCA ISR  - 99% --> SUCCESSFUL DIFFICULT PTCA (EXPANDED TO 3.5 MM  Known occluded Native LCA with widely patent LIMA-LAD, SVG-OM1 and SVG-OM2  Severe Systemic Hypertension: 165/51/90 mmHg  PROCEDURE: Procedure(s): LEFT HEART CATHETERIZATION WITH CORONARY/GRAFT ANGIOGRAM (N/A)   5 Fr RFA access - Mod Seldinger under flouro --> JR4 for RCA & SVG Angio, IMA for LIMA angio (advanced over J wire & flouro), LCA not imaged b/c known occlusion. AoV Not crossed Cutting Balloon PTCA of 99% ISR in mid RCA  Guide: AL 0.75, Luge wire, Guideliner Catheter;  Emerge 2.0 mm x 12 mm (to advance Guideliner): .  8 Atm x 20 Sec  BS Flextome Cutting Balloon 2.75 mm x 10 mm:  14 Atm x 30 Sec  [place]da Emerge 3.0 mm x 12 mm  16 Atm x 600 Sec  [place] Emerge 3.5 mm x 6 mm  14 Atm x 60 Sec   SURGEON: Surgeon(s) and Role:  * Leonie Man, MD - Primary  ASSISTANTS: Ricki Rodriguez, RT  ANESTHESIA: local and IV sedation; 12 ml Lidocaine, 1 Versed, 100 mcg Fentanyl EBL:   < 20 ml 6 Fr RFA Sheath: sutured in place to be pulled in PACU/post procedure unit with manual pressure.   MEDICATIONS USED:   Brilinta 90 mg PO  Angiomax Bolus & gtt  IV Labetalol 20 mg x 1  SL NTG 0.4 mg x 2  Omnipaque Contrast 125 ml  DICTATION: .Note written in EPIC PLAN OF CARE: Post Procedure unit. BP control. Change antiplatelet x Brilinta; Needs HD; Expect d/c tomorrow PATIENT DISPOSITION: PACU - hemodynamically stable.  Leonie Man, M.D., M.S. Interventional Cardiologist   Treatments: See above  Discharge Exam: Blood pressure 120/36, pulse 64, temperature 97.6 F (36.4 C), temperature source Oral, resp. rate 20, height _0  (1.651 m), weight 166 lb 7.2 oz (75.5 kg), SpO2 98 %.   Disposition: 01-Home or Self Care      Discharge Instructions    Diet - low sodium heart healthy    Complete by:  As directed      Discharge instructions    Complete by:  As directed   No lifting more than a half  gallon of milk or driving for three days.     Increase activity slowly    Complete by:  As directed             Medication List    STOP taking these medications        clopidogrel 75 MG tablet  Commonly known as:  PLAVIX      TAKE these medications        allopurinol 100 MG tablet  Commonly known as:  ZYLOPRIM  Take 100 mg by mouth daily.     aspirin 81 MG chewable tablet  Chew 81 mg by mouth at bedtime.     atorvastatin 40 MG tablet  Commonly known as:  LIPITOR  Take 40 mg by mouth at bedtime.     BESIVANCE 0.6 % Susp  Generic drug:  Besifloxacin HCl  Place 1 drop into both eyes See admin instructions. Use eye drops 4 times daily for 2 days following injection by Dr. Zigmund Daniel (  once a month)     carvedilol 25 MG tablet  Commonly known as:  COREG  Take 25 mg by mouth 2 (two) times daily with a meal.     glucose blood test strip  Commonly known as:  PRODIGY NO CODING BLOOD GLUC  Test as directed     insulin NPH Human 100 UNIT/ML injection  Commonly known as:  HUMULIN N,NOVOLIN N  Inject 10-20 Units into the skin 2 (two) times daily. Take 10 units if blood sugar is 130-150.  Take 20 units if blood sugar is >150.     isosorbide mononitrate 60 MG 24 hr tablet  Commonly known as:  IMDUR  Take 60 mg by mouth 2 (two) times daily.     levothyroxine 125 MCG tablet  Commonly known as:  SYNTHROID, LEVOTHROID  Take 1 tablet (125 mcg total) by mouth daily before breakfast.     lidocaine-prilocaine cream  Commonly known as:  EMLA  Apply 1 application topically daily as needed (prior to dialysis treatments).     loperamide 2 MG capsule  Commonly known as:  IMODIUM  Take 2 mg by mouth daily as needed for diarrhea or loose stools.     multivitamin Tabs tablet  Take 1 tablet by mouth daily.     nitroGLYCERIN 0.4 MG SL tablet  Commonly known as:  NITROSTAT  Place 1 tablet (0.4 mg total) under the tongue every 5 (five) minutes as needed for chest pain.     sevelamer  carbonate 800 MG tablet  Commonly known as:  RENVELA  Take 3(three)  857m tablets by mouth with every meal.     terazosin 2 MG capsule  Commonly known as:  HYTRIN  Take 1 capsule (2 mg total) by mouth at bedtime.     ticagrelor 90 MG Tabs tablet  Commonly known as:  BRILINTA  Take 1 tablet (90 mg total) by mouth 2 (two) times daily.     trimethoprim-polymyxin b ophthalmic solution  Commonly known as:  POLYTRIM  Place 1 drop into both eyes as needed.       Follow-up Information    Follow up with HLeonie Man MD On 02/12/2014.   Specialty:  Cardiology   Why:  9:15 AM   Contact information:   [phone]      Greater than 30 minutes was spent completing the patient's discharge.   Signed:Tarri Fuller PHornsby Bend2/03/2014, 9:11 AM

## 2014-02-10 NOTE — Progress Notes (Signed)
Tech offered Pt a bath, He stated he will take care of bath once he returned home.

## 2014-02-11 DIAGNOSIS — N2581 Secondary hyperparathyroidism of renal origin: Secondary | ICD-10-CM | POA: Diagnosis not present

## 2014-02-11 DIAGNOSIS — N186 End stage renal disease: Secondary | ICD-10-CM | POA: Diagnosis not present

## 2014-02-11 DIAGNOSIS — D509 Iron deficiency anemia, unspecified: Secondary | ICD-10-CM | POA: Diagnosis not present

## 2014-02-11 DIAGNOSIS — E1129 Type 2 diabetes mellitus with other diabetic kidney complication: Secondary | ICD-10-CM | POA: Diagnosis not present

## 2014-02-11 DIAGNOSIS — D631 Anemia in chronic kidney disease: Secondary | ICD-10-CM | POA: Diagnosis not present

## 2014-02-11 NOTE — CV Procedure (Signed)
 CARDIAC CATHETERIZATIONAND PERCUTANEOUS CORONARY INTERVENTION REPORT  NAME:  Jones H Kritikos   MRN: [id number] DOB:  12/26/1940   ADMIT DATE: 02/08/2014 Procedure Date: 02/11/2014  INTERVENTIONAL CARDIOLOGIST: David W Harding, M.D., MS PRIMARY CARE PROVIDER: Katherine Tabori, MD PRIMARY CARDIOLOGIST: HARDING, DAVID W, MD, MS  PATIENT:  [name] [name] is a 74 y.o. male with long-standing CAD-CABG (with re-do CABG in 2006) who has had BMS PCI to SVG-OM and extensive PCI to native RCA from 2012-2015 - most recently with new stent placed for distal ISR lesion in the mid RCA for Unstable Angina.  He presents again with ACS-crescendo angina with + troponin c/w NSTEMI. He has ESRD on HD & chronic stable angina, with frequent bouts of crescendo angina -- of late related to RCA disease.  He is referred for invasive evaluation.  PRE-OPERATIVE DIAGNOSIS:    NSTEMI  Known CAD-CABG & PCI  PROCEDURES PERFORMED:    Catheter placement for Native Coronary and Graft Angiography  via Right Common Femoral Artery   Complex Percutaneous Coronary Angioplasty only (Cutting Balloon PTCA) of severe distal stent   PROCEDURE: The patient was brought to the 2nd Floor [facility] in the fasting state and prepped and draped in the usual sterile fashion for RIGHT COMMON FEMORAL artery access.  Sterile technique was used including antiseptics, cap, gloves, gown, hand hygiene, mask and sheet. Skin prep: Chlorhexidine.   Consent: Risks of procedure as well as the alternatives and risks of each were explained to the (patient/caregiver). Consent for procedure obtained.   Time Out: Verified patient identification, verified procedure, site/side was marked, verified correct patient position, special equipment/implants available, medications/allergies/relevent history reviewed, required imaging and test results available. Performed.  Access:   RIGHT COMMON FEMORAL Artery: 5 Fr Sheath -   fluoroscopically guided modified Seldinger Technique    Coronary & Graft Angiography: 5Fr Catheters advanced or exchanged over a standard J-wire; JR-4 catheter advanced first.  Left Coronary Artery Cineangiography: Deferred - known to be severe disease  Right Coronary Artery, SVG-OM1 & SVG-OM2 Cineangiography: JR4 Catheter  LIMA-LAD Cineangiography: IMA Catheter redirected into Left Subclavian Artery & exchanged over long-exchange wire for IMA catheter.  Sheath removed in the Post Procedure Unit with manual pressure for hemostasis.   FINDINGS:  Hemodynamics:   Central Aortic Pressure / Mean: 165/51/90 mmHg  Left Ventriculography: Deferred  Coronary / Graft Anatomy: (Native LCA not visualized b/c known severe disease)  Dominance: Right  LIMA-LAD: Widely patent graft to a very small caliber distal LAD with diffuse luminal irregularities.   SVG-OM2: Widely patent graft with patent stent; the perfused OM branch is small and diffusely diseased but with brisk flow.   SVG-OM3: Widely patent graft that also fills a very small caliber vessels OM branch with mild diffuse disease.   RCA: Large caliber, dominant vessel with continued to have a 40-60% calcified stenosis in the proximal segment after the first bend. There is 95-99% in-stent restenosis completely within the recently placed overlapping stent.   Beyond this the RCA is relatively normal with , with a widely patent distal stent prior to terminating in the Right Posterior Descending Artery (RPDA). There is a focal progress stenosis just beyond the stent.. The remainder the PDA is free of any significant disease is mild diffuse luminal irregularities  After reviewing the initial angiography, the culprit lesion was thought to be 99% ISR in the more distal stent in the mid RCA.  Preparation were made to proceed with PTCA on this lesion.    From prior interventions on this vessel, we preemptively used a Guideliner catheter to allow for  passage of the PTCA balloons beyond the first 2 bends of the RCA.  Percutaneous Coronary Intervention:  Sheath exchanged for 6 Fr Guide: AL 0.75, Luge wire, Guideliner Catheter;  Emerge 2.0 mm x 12 mm (to advance Guideliner): .  8 Atm x 20 Sec  BS Flextome Cutting Balloon 2.75 mm x 10 mm:  14 Atm x 30 Sec  [place] Emerge 3.0 mm x 12 mm  16 Atm x 600 Sec  [place] Emerge 3.5 mm x 6 mm  14 Atm x 60 Sec  Post deployment angiography in multiple views, with and without guidewire in place revealed adequate re-expansion and lesion coverage.  There was no evidence of dissection or perforation.  MEDICATIONS:  Anesthesia:  Local Lidocaine 14 ml  Sedation:  1 mg IV Versed, 100 mcg IV fentanyl ;   Omnipaque Contrast: 125 ml  Anticoagulation: Angiomax Bolus & drip  Anti-Platelet Agent:  Brilinta 90 mg  PATIENT DISPOSITION:    The patient was transferred to the PACU holding area in a hemodynamicaly stable, chest pain free condition.  The patient tolerated the procedure well, and there were no complications.  EBL:   < 10 ml  The patient was stable before, during, and after the procedure.  POST-OPERATIVE DIAGNOSIS:    SEVERE MID RCA ISR - 99% --> SUCCESSFUL DIFFICULT PTCA (EXPANDED TO 3.5 MM  Known occluded Native LCA with widely patent LIMA-LAD, SVG-OM1 and SVG-OM2  Severe Systemic Hypertension: 165/51/90 mmHg  PLAN OF CARE:  Transfer to post-procedure unit for sheath removal  Have changed antiplatelet to Brilinta - hopefully will have less ISR  Needs HD this PM  Expect d/c in Am    Leonie Man, M.D., M.S. Interventional Cardiologist   Pager # [phone]

## 2014-02-12 ENCOUNTER — Ambulatory Visit (INDEPENDENT_AMBULATORY_CARE_PROVIDER_SITE_OTHER): Payer: Medicare Other | Admitting: Cardiology

## 2014-02-12 ENCOUNTER — Telehealth: Payer: Self-pay

## 2014-02-12 ENCOUNTER — Encounter: Payer: Self-pay | Admitting: Cardiology

## 2014-02-12 VITALS — BP 140/58 | HR 60 | Ht 65.0 in | Wt 165.2 lb

## 2014-02-12 DIAGNOSIS — Z9861 Coronary angioplasty status: Secondary | ICD-10-CM | POA: Diagnosis not present

## 2014-02-12 DIAGNOSIS — Z955 Presence of coronary angioplasty implant and graft: Secondary | ICD-10-CM

## 2014-02-12 DIAGNOSIS — I5042 Chronic combined systolic (congestive) and diastolic (congestive) heart failure: Secondary | ICD-10-CM | POA: Diagnosis not present

## 2014-02-12 DIAGNOSIS — I200 Unstable angina: Secondary | ICD-10-CM | POA: Diagnosis not present

## 2014-02-12 DIAGNOSIS — E785 Hyperlipidemia, unspecified: Secondary | ICD-10-CM | POA: Diagnosis not present

## 2014-02-12 DIAGNOSIS — I214 Non-ST elevation (NSTEMI) myocardial infarction: Secondary | ICD-10-CM | POA: Diagnosis not present

## 2014-02-12 DIAGNOSIS — I25708 Atherosclerosis of coronary artery bypass graft(s), unspecified, with other forms of angina pectoris: Secondary | ICD-10-CM | POA: Diagnosis not present

## 2014-02-12 DIAGNOSIS — I10 Essential (primary) hypertension: Secondary | ICD-10-CM

## 2014-02-12 DIAGNOSIS — I2510 Atherosclerotic heart disease of native coronary artery without angina pectoris: Secondary | ICD-10-CM | POA: Diagnosis not present

## 2014-02-12 NOTE — Telephone Encounter (Signed)
Admit date: 02/08/2014 Discharge date: 02/10/2014  Reason for admission:  Unstable angina  Pt was seen by cardiology today for follow up.

## 2014-02-12 NOTE — Patient Instructions (Signed)
Your physician recommends that you schedule a follow-up appointment in: 3-4 months with Dr. Ellyn Hack.

## 2014-02-13 ENCOUNTER — Encounter: Payer: Self-pay | Admitting: Cardiology

## 2014-02-13 DIAGNOSIS — D631 Anemia in chronic kidney disease: Secondary | ICD-10-CM | POA: Diagnosis not present

## 2014-02-13 DIAGNOSIS — D509 Iron deficiency anemia, unspecified: Secondary | ICD-10-CM | POA: Diagnosis not present

## 2014-02-13 DIAGNOSIS — E1129 Type 2 diabetes mellitus with other diabetic kidney complication: Secondary | ICD-10-CM | POA: Diagnosis not present

## 2014-02-13 DIAGNOSIS — N186 End stage renal disease: Secondary | ICD-10-CM | POA: Diagnosis not present

## 2014-02-13 DIAGNOSIS — N2581 Secondary hyperparathyroidism of renal origin: Secondary | ICD-10-CM | POA: Diagnosis not present

## 2014-02-13 NOTE — Assessment & Plan Note (Signed)
Essentially long term dual antiplatelet therapy is required. I switched him from Plavix to Brilinta.

## 2014-02-13 NOTE — Assessment & Plan Note (Signed)
This most recent episode was a small  NSTEMI -- we'll probably need to reassess his EF in the near future just to make sure it is not getting worse with these recurrent episodes.

## 2014-02-13 NOTE — Progress Notes (Signed)
PCP: Annye Asa, MD  Clinic Note: Chief Complaint  Patient presents with  . Follow-up    Cath 02/09/14, no chest discomfort since D/C    HPI: [name] [name] is a 74 y.o. male with a PMH below who presents today for his initial posthospital followup after urgent catheterization for NSTEMI this Monday Jan 1.   He underwent catheterization on the January 2 and was found have severe in-stent restenosis in the RCA treated with PTCA. Prior to that he had severe crescendo angina/unstable angina back in October, it was noted to have distal stent edge stenosis and had a new  Promus Premier DES stent placed.  The current stenosis was at the overlap segment/presents with same lesion as last PCI.  Past Medical History  Diagnosis Date  . Diabetes mellitus   . Hyperlipidemia   . Hypertension   . Gout   . Hypothyroidism     On supplementation  . LBBB (left bundle branch block)     Chronic  . History of colon cancer 2003    colectomy for CA. no recurrence . never required  radiation or chem  . Anemia     Likely secondary to history of GI bleed  . Stroke 1998  . End stage renal disease on dialysis     Dialyzes at [facility]: Tues/Th/Sat - left upper cavity AV fistula brachiocephalic  . CAD in native artery; and the grafts 1992, 1997, 2002, 2006, 2012    CABG 1992 and redo CABG 2006  . CAD (coronary artery disease) of bypass graft 2006, 2012    In 2006: Occluded SVG-OM noted (SVG-diagonal was previously occluded); 2012: Severe lesion in redo SVG-OM1 --> BMS PCI   . CAD S/P percutaneous coronary angioplasty October 2012; Feb, Apr & Oct 2015; Feb 2016    a) 10/'12: NSTEMI: PCI to SVG-OM1 Integrity BMS 3.5 x 15 (3.85 mm);;b) 2/'15: UA - PCI mid RCA Xience Ap DES 2.75 x 15 (3.1 mm);; c) 4/'15 : UA - focal dRCA Resolute DES 2.25 x 8 (2.5 mm);; d) 10/'15: PCI to mRCA ISR w/ post stent lesion - Promus P 2.75 x 16 (3.25 mm); e) 2/'16: NSTEMI: Cutting Balloon PTCA of  mRCA ISR (3.5 mm  post-dilation)  . History of nuclear stress test Sept 2014    EF 39% (down from) 45%, no ischemia; but now the inferobasal perfusion defect is determined to be likley prior infarction (as opposed to diaphragmatic attenuation)  . Echocardiogram findings abnormal, without diagnosis [id number]; Oct 2014    EF 50-55%, anteroseptal hypokinesis. Mild to moderate concentric LVH. Mild MR, aortic sclerosis. Mild atrial dilatation bilaterally.; Oct - mild LVH, EF ~50-55%, incoordinate septal WM, - with no other notable WMA  . History of Non-ST elevated myocardial infarction (non-STEMI) 1992, 2006, 2012; 02/2013  . Peptic ulcer disease  2004  . BPH (benign prostatic hyperplasia)   . Chronic low back pain    Interval History: he actually feels quite good today. No recurrent angina symptoms since his PTCA. He is not had used nitroglycerin since his PCI. He still is on his regular medicines. I switched him to Brilinta from Plavix and is not noticing any significant bleeding or bruising, melena, hematochezia or hematuria. He still has fatigue and poor exercise tolerance. He doesn't really do much of anything during the course today, he is trying to go for walk in the afternoons, but is bothered more by his knees and hip and back  His exertional dyspnea is definitely  improved.  He still has baseline orthopnea but no PND unless he is volume overloaded --  Usually over the long weekend when he skips a day of dialysis.    He denies any rapid or irregular heartbeats on/arrhythmias. He does have occasional palpitations but nothing significant. No TIA or amaurosis fugax symptoms, syncope/near syncope. He still gets a little bit dizzy after dialysis and has labile blood pressures. They are currently adjusting his dry weight because he has lost weight from his baseline. His first and had been about a month ago. He is not eating as much as he use to mostly because he doesn't have any appetite. He denies any bleeding complications  related to his antiplatelet agents. He has not had trouble with claudication.  ROS: A comprehensive was performed. Review of Systems  Constitutional: Positive for malaise/fatigue.  HENT: Negative for nosebleeds.   Respiratory: Negative for cough and wheezing.   Gastrointestinal: Negative for blood in stool and melena.  Genitourinary: Positive for hematuria.  Musculoskeletal: Positive for back pain and joint pain.  Neurological: Negative for dizziness.  Endo/Heme/Allergies: Does not bruise/bleed easily.  Psychiatric/Behavioral: Positive for depression (His overall general medical condition.).  All other systems reviewed and are negative.   Current Outpatient Prescriptions on File Prior to Visit  Medication Sig Dispense Refill  . allopurinol (ZYLOPRIM) 100 MG tablet Take 100 mg by mouth daily.    Marland Kitchen aspirin 81 MG chewable tablet Chew 81 mg by mouth at bedtime.    Marland Kitchen atorvastatin (LIPITOR) 40 MG tablet Take 40 mg by mouth at bedtime.    Marland Kitchen Besifloxacin HCl (BESIVANCE) 0.6 % SUSP Place 1 drop into both eyes See admin instructions. Use eye drops 4 times daily for 2 days following injection by Dr. Zigmund Daniel (once a month)    . carvedilol (COREG) 25 MG tablet Take 25 mg by mouth 2 (two) times daily with a meal.    . glucose blood (PRODIGY NO CODING BLOOD GLUC) test strip Test as directed 100 each 6  . insulin NPH Human (HUMULIN N,NOVOLIN N) 100 UNIT/ML injection Inject 10-20 Units into the skin 2 (two) times daily. Take 10 units if blood sugar is 130-150.  Take 20 units if blood sugar is >150.    Marland Kitchen isosorbide mononitrate (IMDUR) 60 MG 24 hr tablet Take 60 mg by mouth 2 (two) times daily.    Marland Kitchen levothyroxine (SYNTHROID, LEVOTHROID) 125 MCG tablet Take 1 tablet (125 mcg total) by mouth daily before breakfast. 90 tablet 1  . lidocaine-prilocaine (EMLA) cream Apply 1 application topically daily as needed (prior to dialysis treatments).     . loperamide (IMODIUM) 2 MG capsule Take 2 mg by mouth daily as  needed for diarrhea or loose stools.     . multivitamin (RENA-VIT) TABS tablet Take 1 tablet by mouth daily. 90 tablet 3  . nitroGLYCERIN (NITROSTAT) 0.4 MG SL tablet Place 1 tablet (0.4 mg total) under the tongue every 5 (five) minutes as needed for chest pain. 100 tablet 4  . sevelamer carbonate (RENVELA) 800 MG tablet Take 3(three)  800mg  tablets by mouth with every meal.    . terazosin (HYTRIN) 2 MG capsule Take 1 capsule (2 mg total) by mouth at bedtime. 90 capsule 3  . ticagrelor (BRILINTA) 90 MG TABS tablet Take 1 tablet (90 mg total) by mouth 2 (two) times daily. 60 tablet 11  . trimethoprim-polymyxin b (POLYTRIM) ophthalmic solution Place 1 drop into both eyes as needed.      No current  facility-administered medications on file prior to visit.   Allergies  Allergen Reactions  . Shellfish Allergy     Causes gout flare-ups    SOCIAL AND FAMILY HISTORY REVIEWED IN EPIC -- no change  Wt Readings from Last 3 Encounters:  02/12/14 165 lb 3.2 oz (74.934 kg)  02/10/14 166 lb 7.2 oz (75.5 kg)  11/18/13 164 lb 8 oz (74.617 kg)    PHYSICAL EXAM BP 140/58 mmHg  Pulse 60  Ht 5\' 5"  (1.651 m)  Wt 165 lb 3.2 oz (74.934 kg)  BMI 27.49 kg/m2 General appearance: alert, cooperative, appears stated age, no distress; and Borderline obese. Chronically ill-appearing, pale, well groomed.Marland Kitchen answers questions appropriately.  Neck: no adenopathy, no carotid bruit, mild JVD; Supple, symmetrical, trachea midline  Lungs: CTAB, normal percussion bilaterally and Nonlabored, good air movement  Heart: normal apical impulse, RRR, S1, S2 normal, S4 present, SEM 2/6, crescendo, decrescendo and harsh at 2nd right intercostal space, no click and no rub  Abdomen: soft, non-tender; bowel sounds normal; no masses, no organomegaly and Mild truncal obesity  Extremities: extremities normal, atraumatic, no cyanosis or edema and fistula in the left upper arm with good thrill  Pulses: 2+ and symmetric   Neurologic: Grossly normal   Adult ECG Report  Not performed  Recent Labs:    Lab Results  Component Value Date   CHOL 71 11/18/2013   HDL 22.40* 11/18/2013   LDLCALC 17 11/18/2013   LDLDIRECT 33.5 12/22/2012   TRIG 156.0* 11/18/2013   CHOLHDL 3 11/18/2013    ASSESSMENT / PLAN: CAD S/P percutaneous coronary angioplasty Additionally, the most significant episodes of angina he's been having over the last year having involved his RCA lesions. The last 2 interventions have been roughly the same spot. We could consider the possibility of breaking therapy in the future. I am concerned however that he may eventually shut down his RCA. I don't think he would be a candidate for a third redo CABG.  For now just continue to try to keep your help as long as possible. System of the Brilinta from Plavix hoping that that would help. He is on standing and there and statin as well as maximum dose carvedilol.   CAD (coronary artery disease) of bypass graft - PCI to SVG-OM with BMS; PCI-mid RCA with a Xience DES Interestingly his grafts including his LIMA and 2 vein grafts from the most recent surgery have remained patent. 1 does have a bare-metal stent is widely patent.     Chronic combined systolic and diastolic congestive heart failure, NYHA class 2 This probably is the most consistent reason for his chronic stable angina. He has labile blood pressures like him to do much more with his blood pressures besides using the Imdur and carvedilol.  They have been shooting for lower dry weight in dialysis over the past 2 years   Hyperlipidemia with target LDL less than 70 On statin. Last lipids showed pretty good control overall.   Essential hypertension Because of his low blood pressures surrounding dialysis, we will not push his medications any further.   Presence of drug coated stent in right coronary artery Essentially long term dual antiplatelet therapy is required. I switched him from  Plavix to Brilinta.   History of Non-ST elevated myocardial infarction (non-STEMI) This most recent episode was a small  NSTEMI -- we'll probably need to reassess his EF in the near future just to make sure it is not getting worse with these recurrent episodes.  No orders of the defined types were placed in this encounter.   No orders of the defined types were placed in this encounter.     Followup: 4 months   Brewster Wolters, Leonie Green, M.D., M.S. Interventional Cardiologist   Pager # [phone]

## 2014-02-13 NOTE — Assessment & Plan Note (Signed)
Additionally, the most significant episodes of angina he's been having over the last year having involved his RCA lesions. The last 2 interventions have been roughly the same spot. We could consider the possibility of breaking therapy in the future. I am concerned however that he may eventually shut down his RCA. I don't think he would be a candidate for a third redo CABG.  For now just continue to try to keep your help as long as possible. System of the Brilinta from Plavix hoping that that would help. He is on standing and there and statin as well as maximum dose carvedilol.

## 2014-02-13 NOTE — Assessment & Plan Note (Signed)
Because of his low blood pressures surrounding dialysis, we will not push his medications any further.

## 2014-02-13 NOTE — Assessment & Plan Note (Signed)
On statin. Last lipids showed pretty good control overall.

## 2014-02-13 NOTE — Assessment & Plan Note (Signed)
Interestingly his grafts including his LIMA and 2 vein grafts from the most recent surgery have remained patent. 1 does have a bare-metal stent is widely patent.

## 2014-02-13 NOTE — Assessment & Plan Note (Signed)
This probably is the most consistent reason for his chronic stable angina. He has labile blood pressures like him to do much more with his blood pressures besides using the Imdur and carvedilol.  They have been shooting for lower dry weight in dialysis over the past 2 years

## 2014-02-16 DIAGNOSIS — N186 End stage renal disease: Secondary | ICD-10-CM | POA: Diagnosis not present

## 2014-02-16 DIAGNOSIS — E1129 Type 2 diabetes mellitus with other diabetic kidney complication: Secondary | ICD-10-CM | POA: Diagnosis not present

## 2014-02-16 DIAGNOSIS — D631 Anemia in chronic kidney disease: Secondary | ICD-10-CM | POA: Diagnosis not present

## 2014-02-16 DIAGNOSIS — N2581 Secondary hyperparathyroidism of renal origin: Secondary | ICD-10-CM | POA: Diagnosis not present

## 2014-02-16 DIAGNOSIS — D509 Iron deficiency anemia, unspecified: Secondary | ICD-10-CM | POA: Diagnosis not present

## 2014-02-18 DIAGNOSIS — E1129 Type 2 diabetes mellitus with other diabetic kidney complication: Secondary | ICD-10-CM | POA: Diagnosis not present

## 2014-02-18 DIAGNOSIS — N186 End stage renal disease: Secondary | ICD-10-CM | POA: Diagnosis not present

## 2014-02-18 DIAGNOSIS — D509 Iron deficiency anemia, unspecified: Secondary | ICD-10-CM | POA: Diagnosis not present

## 2014-02-18 DIAGNOSIS — D631 Anemia in chronic kidney disease: Secondary | ICD-10-CM | POA: Diagnosis not present

## 2014-02-18 DIAGNOSIS — N2581 Secondary hyperparathyroidism of renal origin: Secondary | ICD-10-CM | POA: Diagnosis not present

## 2014-02-20 DIAGNOSIS — E1129 Type 2 diabetes mellitus with other diabetic kidney complication: Secondary | ICD-10-CM | POA: Diagnosis not present

## 2014-02-20 DIAGNOSIS — D509 Iron deficiency anemia, unspecified: Secondary | ICD-10-CM | POA: Diagnosis not present

## 2014-02-20 DIAGNOSIS — N186 End stage renal disease: Secondary | ICD-10-CM | POA: Diagnosis not present

## 2014-02-20 DIAGNOSIS — N2581 Secondary hyperparathyroidism of renal origin: Secondary | ICD-10-CM | POA: Diagnosis not present

## 2014-02-20 DIAGNOSIS — D631 Anemia in chronic kidney disease: Secondary | ICD-10-CM | POA: Diagnosis not present

## 2014-02-23 DIAGNOSIS — D631 Anemia in chronic kidney disease: Secondary | ICD-10-CM | POA: Diagnosis not present

## 2014-02-23 DIAGNOSIS — N186 End stage renal disease: Secondary | ICD-10-CM | POA: Diagnosis not present

## 2014-02-23 DIAGNOSIS — E1129 Type 2 diabetes mellitus with other diabetic kidney complication: Secondary | ICD-10-CM | POA: Diagnosis not present

## 2014-02-23 DIAGNOSIS — D509 Iron deficiency anemia, unspecified: Secondary | ICD-10-CM | POA: Diagnosis not present

## 2014-02-23 DIAGNOSIS — N2581 Secondary hyperparathyroidism of renal origin: Secondary | ICD-10-CM | POA: Diagnosis not present

## 2014-02-25 DIAGNOSIS — D509 Iron deficiency anemia, unspecified: Secondary | ICD-10-CM | POA: Diagnosis not present

## 2014-02-25 DIAGNOSIS — N186 End stage renal disease: Secondary | ICD-10-CM | POA: Diagnosis not present

## 2014-02-25 DIAGNOSIS — N2581 Secondary hyperparathyroidism of renal origin: Secondary | ICD-10-CM | POA: Diagnosis not present

## 2014-02-25 DIAGNOSIS — E1129 Type 2 diabetes mellitus with other diabetic kidney complication: Secondary | ICD-10-CM | POA: Diagnosis not present

## 2014-02-25 DIAGNOSIS — D631 Anemia in chronic kidney disease: Secondary | ICD-10-CM | POA: Diagnosis not present

## 2014-02-27 DIAGNOSIS — D509 Iron deficiency anemia, unspecified: Secondary | ICD-10-CM | POA: Diagnosis not present

## 2014-02-27 DIAGNOSIS — N2581 Secondary hyperparathyroidism of renal origin: Secondary | ICD-10-CM | POA: Diagnosis not present

## 2014-02-27 DIAGNOSIS — D631 Anemia in chronic kidney disease: Secondary | ICD-10-CM | POA: Diagnosis not present

## 2014-02-27 DIAGNOSIS — N186 End stage renal disease: Secondary | ICD-10-CM | POA: Diagnosis not present

## 2014-02-27 DIAGNOSIS — E1129 Type 2 diabetes mellitus with other diabetic kidney complication: Secondary | ICD-10-CM | POA: Diagnosis not present

## 2014-03-02 DIAGNOSIS — D631 Anemia in chronic kidney disease: Secondary | ICD-10-CM | POA: Diagnosis not present

## 2014-03-02 DIAGNOSIS — D509 Iron deficiency anemia, unspecified: Secondary | ICD-10-CM | POA: Diagnosis not present

## 2014-03-02 DIAGNOSIS — N2581 Secondary hyperparathyroidism of renal origin: Secondary | ICD-10-CM | POA: Diagnosis not present

## 2014-03-02 DIAGNOSIS — E1129 Type 2 diabetes mellitus with other diabetic kidney complication: Secondary | ICD-10-CM | POA: Diagnosis not present

## 2014-03-02 DIAGNOSIS — N186 End stage renal disease: Secondary | ICD-10-CM | POA: Diagnosis not present

## 2014-03-04 ENCOUNTER — Telehealth: Payer: Self-pay | Admitting: Cardiology

## 2014-03-04 DIAGNOSIS — N2581 Secondary hyperparathyroidism of renal origin: Secondary | ICD-10-CM | POA: Diagnosis not present

## 2014-03-04 DIAGNOSIS — D509 Iron deficiency anemia, unspecified: Secondary | ICD-10-CM | POA: Diagnosis not present

## 2014-03-04 DIAGNOSIS — N186 End stage renal disease: Secondary | ICD-10-CM | POA: Diagnosis not present

## 2014-03-04 DIAGNOSIS — E1129 Type 2 diabetes mellitus with other diabetic kidney complication: Secondary | ICD-10-CM | POA: Diagnosis not present

## 2014-03-04 DIAGNOSIS — D631 Anemia in chronic kidney disease: Secondary | ICD-10-CM | POA: Diagnosis not present

## 2014-03-04 NOTE — Telephone Encounter (Signed)
Spoke to patient RN INFORMED PATIENT THERE WAS NOT A GENERIC FOR  BRILITINA HE STATES IT COST $47 A MONTH HE STATES HE IS HAVING PROBLEMS WITH BREATHING PROBLEMS - HE LOSSES BREATH OFTEN HE WILL LIKE TO CHANGE BACK TO PLAVIX IF POSSIBLE. HE STATES HE WILL TAKE FOR THE NEXT  MONTH IF NEEDED.  RN WILL  DEFER TO DR HARDING PATIENT AWARE.

## 2014-03-04 NOTE — Telephone Encounter (Signed)
Pt called in wanting to speak with Ivin Booty to see if there is a generic form for the Brilinta and if so he would need that called in to the [facility] on [address]. Please f/u  Thanks

## 2014-03-06 DIAGNOSIS — D509 Iron deficiency anemia, unspecified: Secondary | ICD-10-CM | POA: Diagnosis not present

## 2014-03-06 DIAGNOSIS — E1129 Type 2 diabetes mellitus with other diabetic kidney complication: Secondary | ICD-10-CM | POA: Diagnosis not present

## 2014-03-06 DIAGNOSIS — N186 End stage renal disease: Secondary | ICD-10-CM | POA: Diagnosis not present

## 2014-03-06 DIAGNOSIS — D631 Anemia in chronic kidney disease: Secondary | ICD-10-CM | POA: Diagnosis not present

## 2014-03-06 DIAGNOSIS — N2581 Secondary hyperparathyroidism of renal origin: Secondary | ICD-10-CM | POA: Diagnosis not present

## 2014-03-08 DIAGNOSIS — N186 End stage renal disease: Secondary | ICD-10-CM | POA: Diagnosis not present

## 2014-03-08 DIAGNOSIS — Z992 Dependence on renal dialysis: Secondary | ICD-10-CM | POA: Diagnosis not present

## 2014-03-08 NOTE — Telephone Encounter (Signed)
Lets try to make it till the end of the current Rx.  Then we can change back.  [place]

## 2014-03-09 DIAGNOSIS — N2581 Secondary hyperparathyroidism of renal origin: Secondary | ICD-10-CM | POA: Diagnosis not present

## 2014-03-09 DIAGNOSIS — D509 Iron deficiency anemia, unspecified: Secondary | ICD-10-CM | POA: Diagnosis not present

## 2014-03-09 DIAGNOSIS — E1129 Type 2 diabetes mellitus with other diabetic kidney complication: Secondary | ICD-10-CM | POA: Diagnosis not present

## 2014-03-09 DIAGNOSIS — N186 End stage renal disease: Secondary | ICD-10-CM | POA: Diagnosis not present

## 2014-03-09 DIAGNOSIS — D631 Anemia in chronic kidney disease: Secondary | ICD-10-CM | POA: Diagnosis not present

## 2014-03-09 NOTE — Telephone Encounter (Signed)
[name] [name] is calling about the medication(Brilitina) . Please call    Thanks

## 2014-03-10 ENCOUNTER — Telehealth: Payer: Self-pay | Admitting: Cardiology

## 2014-03-10 MED ORDER — CLOPIDOGREL BISULFATE 75 MG PO TABS: 75.0000 mg | ORAL_TABLET | Freq: Every day | ORAL | Status: DC

## 2014-03-10 NOTE — Telephone Encounter (Signed)
Pt is calling in stating that he is having some issues with Brilinta...Marland Kitchen1- its too expensive and 2- he is problem breathing . Please  Call  Thanks

## 2014-03-10 NOTE — Telephone Encounter (Signed)
Spoke with pt wife, per dr harding in the telephone note from 03-04-14, the patient is currently finished with his current supply of brilinta. New script for plavix sent to the pharmacy. Patient will call back if cont to have SOB after changing medications.

## 2014-03-11 NOTE — Telephone Encounter (Signed)
See note on 03/10/14

## 2014-03-15 ENCOUNTER — Other Ambulatory Visit: Payer: Self-pay | Admitting: General Practice

## 2014-03-15 MED ORDER — CARVEDILOL 25 MG PO TABS: 25.0000 mg | ORAL_TABLET | Freq: Two times a day (BID) | ORAL | Status: DC

## 2014-03-19 ENCOUNTER — Ambulatory Visit (INDEPENDENT_AMBULATORY_CARE_PROVIDER_SITE_OTHER): Payer: Medicare Other | Admitting: Family Medicine

## 2014-03-19 ENCOUNTER — Encounter: Payer: Self-pay | Admitting: Family Medicine

## 2014-03-19 ENCOUNTER — Encounter (INDEPENDENT_AMBULATORY_CARE_PROVIDER_SITE_OTHER): Payer: Medicare Other | Admitting: Ophthalmology

## 2014-03-19 VITALS — BP 138/60 | HR 68 | Temp 98.0°F | Resp 16 | Wt 162.5 lb

## 2014-03-19 DIAGNOSIS — I200 Unstable angina: Secondary | ICD-10-CM

## 2014-03-19 DIAGNOSIS — M1030 Gout due to renal impairment, unspecified site: Secondary | ICD-10-CM | POA: Diagnosis not present

## 2014-03-19 DIAGNOSIS — E11311 Type 2 diabetes mellitus with unspecified diabetic retinopathy with macular edema: Secondary | ICD-10-CM | POA: Diagnosis not present

## 2014-03-19 DIAGNOSIS — E1140 Type 2 diabetes mellitus with diabetic neuropathy, unspecified: Secondary | ICD-10-CM

## 2014-03-19 DIAGNOSIS — H35033 Hypertensive retinopathy, bilateral: Secondary | ICD-10-CM | POA: Diagnosis not present

## 2014-03-19 DIAGNOSIS — I10 Essential (primary) hypertension: Secondary | ICD-10-CM

## 2014-03-19 DIAGNOSIS — E11331 Type 2 diabetes mellitus with moderate nonproliferative diabetic retinopathy with macular edema: Secondary | ICD-10-CM | POA: Diagnosis not present

## 2014-03-19 DIAGNOSIS — E11321 Type 2 diabetes mellitus with mild nonproliferative diabetic retinopathy with macular edema: Secondary | ICD-10-CM

## 2014-03-19 DIAGNOSIS — H43813 Vitreous degeneration, bilateral: Secondary | ICD-10-CM | POA: Diagnosis not present

## 2014-03-19 LAB — URIC ACID: Uric Acid, Serum: 3.6 mg/dL — ABNORMAL LOW (ref 4.0–7.8)

## 2014-03-19 LAB — BASIC METABOLIC PANEL
BUN: 42 mg/dL — AB (ref 6–23)
CO2: 38 mEq/L — ABNORMAL HIGH (ref 19–32)
Calcium: 10 mg/dL (ref 8.4–10.5)
Chloride: 92 mEq/L — ABNORMAL LOW (ref 96–112)
Creatinine, Ser: 4.5 mg/dL (ref 0.40–1.50)
GFR: 13.73 mL/min — AB (ref >60.00)
GLUCOSE: 161 mg/dL — AB (ref 70–99)
Potassium: 3.7 mEq/L (ref 3.5–5.1)
SODIUM: 137 meq/L (ref 135–145)

## 2014-03-19 LAB — HM DIABETES EYE EXAM

## 2014-03-19 LAB — HEMOGLOBIN A1C: HEMOGLOBIN A1C: 5.9 % (ref 4.6–6.5)

## 2014-03-19 NOTE — Assessment & Plan Note (Signed)
Chronic problem.  Pt has not had a flare in years.  Would like to stop Allopurinol.  Will forward this to Nephrology to see if they are in agreement.  Discussed my reservations about stopping this in the setting of his renal failure but pt wants to try.  Will restart if has a flare.  Pt expressed understanding and is in agreement w/ plan.

## 2014-03-19 NOTE — Progress Notes (Signed)
   Subjective:    Patient ID: [name] [name], male    DOB: [birth year], 74 y.o.   MRN: [id number]  HPI DM- chronic problem, on SSI.  Not on ACE/ARB due to HD.  UTD on eye exam- had eye injections bilaterally this AM, foot exam.  Pt has had another MI since last visit.  + CP, no SOB since stopping Brillinta.  Denies symptomatic lows.  + neuropathy.  No abd pain.  HTN- chronic problem, pt has tendency to become hypotensive at HD.  Pt is interested in stopping Terazosin.  Gout- chronic problem, has been maintained on Allopurinol w/o any flares in 'a long, long time'.  Pt wants to stop allopurinol and attempt w/o it.   Review of Systems For ROS see HPI     Objective:   Physical Exam  Constitutional: He is oriented to person, place, and time. He appears well-developed and well-nourished. No distress.  HENT:  Head: Normocephalic and atraumatic.  Cardiovascular: Normal rate, regular rhythm and normal heart sounds.   Pulmonary/Chest: Effort normal and breath sounds normal. No respiratory distress. He has no wheezes. He has no rales.  Abdominal: Soft. Bowel sounds are normal. He exhibits no distension. There is no tenderness. There is no rebound.  Musculoskeletal: He exhibits no edema.  Neurological: He is alert and oriented to person, place, and time.  Skin: Skin is warm and dry.  Psychiatric: He has a normal mood and affect. His behavior is normal. Thought content normal.  Vitals reviewed.         Assessment & Plan:

## 2014-03-19 NOTE — Assessment & Plan Note (Signed)
Chronic problem.  Pt continues to use his SSI depending on his appetite and dialysis schedule.  UTD on eye exam and podiatry.  Check labs.  Adjust meds prn

## 2014-03-19 NOTE — Patient Instructions (Signed)
Follow up in 3-4 months to recheck diabetes and cholesterol We'll notify you of your lab results and make any changes if needed STOP the Terazosin and the Allopurinol unless Dr C or Dr Ellyn Hack say otherwise Call with any questions or concerns [place]!!!

## 2014-03-19 NOTE — Progress Notes (Signed)
Pre visit review using our clinic review tool, if applicable. No additional management support is needed unless otherwise documented below in the visit note. 

## 2014-03-19 NOTE — Assessment & Plan Note (Signed)
Chronic problem.  Still dropping low after HD.  Pt is interested in stopping Terazosin nightly.  I am ok w/ this but told pt that I need to forward this to cardiology.  Pt expressed understanding and is in agreement w/ plan.

## 2014-03-24 ENCOUNTER — Encounter: Payer: Self-pay | Admitting: General Practice

## 2014-03-27 DIAGNOSIS — N186 End stage renal disease: Secondary | ICD-10-CM | POA: Diagnosis not present

## 2014-04-05 DIAGNOSIS — N186 End stage renal disease: Secondary | ICD-10-CM | POA: Diagnosis not present

## 2014-04-05 DIAGNOSIS — Z992 Dependence on renal dialysis: Secondary | ICD-10-CM | POA: Diagnosis not present

## 2014-04-05 DIAGNOSIS — I871 Compression of vein: Secondary | ICD-10-CM | POA: Diagnosis not present

## 2014-04-05 DIAGNOSIS — T82858D Stenosis of vascular prosthetic devices, implants and grafts, subsequent encounter: Secondary | ICD-10-CM | POA: Diagnosis not present

## 2014-04-07 DIAGNOSIS — M79675 Pain in left toe(s): Secondary | ICD-10-CM | POA: Diagnosis not present

## 2014-04-07 DIAGNOSIS — E1122 Type 2 diabetes mellitus with diabetic chronic kidney disease: Secondary | ICD-10-CM | POA: Diagnosis not present

## 2014-04-08 DIAGNOSIS — Z992 Dependence on renal dialysis: Secondary | ICD-10-CM | POA: Diagnosis not present

## 2014-04-08 DIAGNOSIS — N186 End stage renal disease: Secondary | ICD-10-CM | POA: Diagnosis not present

## 2014-04-10 DIAGNOSIS — N186 End stage renal disease: Secondary | ICD-10-CM | POA: Diagnosis not present

## 2014-04-10 DIAGNOSIS — D631 Anemia in chronic kidney disease: Secondary | ICD-10-CM | POA: Diagnosis not present

## 2014-04-10 DIAGNOSIS — N2581 Secondary hyperparathyroidism of renal origin: Secondary | ICD-10-CM | POA: Diagnosis not present

## 2014-04-10 DIAGNOSIS — D509 Iron deficiency anemia, unspecified: Secondary | ICD-10-CM | POA: Diagnosis not present

## 2014-04-10 DIAGNOSIS — E1129 Type 2 diabetes mellitus with other diabetic kidney complication: Secondary | ICD-10-CM | POA: Diagnosis not present

## 2014-04-19 ENCOUNTER — Inpatient Hospital Stay (HOSPITAL_COMMUNITY)
Admission: EM | Admit: 2014-04-19 | Discharge: 2014-04-21 | DRG: 250 | Disposition: A | Discharge status: Home / Self Care | Payer: Medicare Other | Attending: Cardiovascular Disease | Admitting: Cardiovascular Disease

## 2014-04-19 ENCOUNTER — Emergency Department (HOSPITAL_COMMUNITY): Payer: Medicare Other

## 2014-04-19 ENCOUNTER — Encounter (HOSPITAL_COMMUNITY): Payer: Self-pay | Admitting: Emergency Medicine

## 2014-04-19 DIAGNOSIS — I25110 Atherosclerotic heart disease of native coronary artery with unstable angina pectoris: Secondary | ICD-10-CM | POA: Diagnosis not present

## 2014-04-19 DIAGNOSIS — I120 Hypertensive chronic kidney disease with stage 5 chronic kidney disease or end stage renal disease: Secondary | ICD-10-CM | POA: Diagnosis present

## 2014-04-19 DIAGNOSIS — Z992 Dependence on renal dialysis: Secondary | ICD-10-CM

## 2014-04-19 DIAGNOSIS — Z85038 Personal history of other malignant neoplasm of large intestine: Secondary | ICD-10-CM | POA: Diagnosis not present

## 2014-04-19 DIAGNOSIS — Z91013 Allergy to seafood: Secondary | ICD-10-CM

## 2014-04-19 DIAGNOSIS — Z794 Long term (current) use of insulin: Secondary | ICD-10-CM

## 2014-04-19 DIAGNOSIS — I249 Acute ischemic heart disease, unspecified: Secondary | ICD-10-CM | POA: Diagnosis not present

## 2014-04-19 DIAGNOSIS — I447 Left bundle-branch block, unspecified: Secondary | ICD-10-CM

## 2014-04-19 DIAGNOSIS — I2584 Coronary atherosclerosis due to calcified coronary lesion: Secondary | ICD-10-CM | POA: Diagnosis present

## 2014-04-19 DIAGNOSIS — Z9861 Coronary angioplasty status: Secondary | ICD-10-CM

## 2014-04-19 DIAGNOSIS — I739 Peripheral vascular disease, unspecified: Secondary | ICD-10-CM

## 2014-04-19 DIAGNOSIS — Z8673 Personal history of transient ischemic attack (TIA), and cerebral infarction without residual deficits: Secondary | ICD-10-CM | POA: Diagnosis not present

## 2014-04-19 DIAGNOSIS — E039 Hypothyroidism, unspecified: Secondary | ICD-10-CM | POA: Diagnosis not present

## 2014-04-19 DIAGNOSIS — R079 Chest pain, unspecified: Secondary | ICD-10-CM | POA: Diagnosis not present

## 2014-04-19 DIAGNOSIS — I779 Disorder of arteries and arterioles, unspecified: Secondary | ICD-10-CM | POA: Insufficient documentation

## 2014-04-19 DIAGNOSIS — N400 Enlarged prostate without lower urinary tract symptoms: Secondary | ICD-10-CM | POA: Diagnosis present

## 2014-04-19 DIAGNOSIS — M545 Low back pain: Secondary | ICD-10-CM | POA: Diagnosis present

## 2014-04-19 DIAGNOSIS — I214 Non-ST elevation (NSTEMI) myocardial infarction: Secondary | ICD-10-CM | POA: Diagnosis not present

## 2014-04-19 DIAGNOSIS — I2582 Chronic total occlusion of coronary artery: Secondary | ICD-10-CM | POA: Diagnosis present

## 2014-04-19 DIAGNOSIS — Z833 Family history of diabetes mellitus: Secondary | ICD-10-CM | POA: Diagnosis not present

## 2014-04-19 DIAGNOSIS — I255 Ischemic cardiomyopathy: Secondary | ICD-10-CM | POA: Diagnosis present

## 2014-04-19 DIAGNOSIS — I10 Essential (primary) hypertension: Secondary | ICD-10-CM | POA: Diagnosis not present

## 2014-04-19 DIAGNOSIS — I200 Unstable angina: Secondary | ICD-10-CM

## 2014-04-19 DIAGNOSIS — D631 Anemia in chronic kidney disease: Secondary | ICD-10-CM | POA: Diagnosis not present

## 2014-04-19 DIAGNOSIS — Z8249 Family history of ischemic heart disease and other diseases of the circulatory system: Secondary | ICD-10-CM | POA: Diagnosis not present

## 2014-04-19 DIAGNOSIS — Z9049 Acquired absence of other specified parts of digestive tract: Secondary | ICD-10-CM | POA: Diagnosis present

## 2014-04-19 DIAGNOSIS — Z79899 Other long term (current) drug therapy: Secondary | ICD-10-CM | POA: Diagnosis not present

## 2014-04-19 DIAGNOSIS — E785 Hyperlipidemia, unspecified: Secondary | ICD-10-CM | POA: Diagnosis present

## 2014-04-19 DIAGNOSIS — G8929 Other chronic pain: Secondary | ICD-10-CM | POA: Diagnosis present

## 2014-04-19 DIAGNOSIS — Z7982 Long term (current) use of aspirin: Secondary | ICD-10-CM | POA: Diagnosis not present

## 2014-04-19 DIAGNOSIS — I25810 Atherosclerosis of coronary artery bypass graft(s) without angina pectoris: Secondary | ICD-10-CM | POA: Diagnosis present

## 2014-04-19 DIAGNOSIS — E1140 Type 2 diabetes mellitus with diabetic neuropathy, unspecified: Secondary | ICD-10-CM | POA: Diagnosis present

## 2014-04-19 DIAGNOSIS — I2510 Atherosclerotic heart disease of native coronary artery without angina pectoris: Secondary | ICD-10-CM | POA: Diagnosis not present

## 2014-04-19 DIAGNOSIS — Z789 Other specified health status: Secondary | ICD-10-CM | POA: Diagnosis present

## 2014-04-19 DIAGNOSIS — Z981 Arthrodesis status: Secondary | ICD-10-CM

## 2014-04-19 DIAGNOSIS — Z955 Presence of coronary angioplasty implant and graft: Secondary | ICD-10-CM

## 2014-04-19 DIAGNOSIS — I252 Old myocardial infarction: Secondary | ICD-10-CM | POA: Diagnosis not present

## 2014-04-19 DIAGNOSIS — Z7902 Long term (current) use of antithrombotics/antiplatelets: Secondary | ICD-10-CM

## 2014-04-19 DIAGNOSIS — T82858A Stenosis of vascular prosthetic devices, implants and grafts, initial encounter: Secondary | ICD-10-CM | POA: Diagnosis not present

## 2014-04-19 DIAGNOSIS — N186 End stage renal disease: Secondary | ICD-10-CM | POA: Diagnosis present

## 2014-04-19 HISTORY — DX: Malignant neoplasm of colon, unspecified: C18.9

## 2014-04-19 HISTORY — DX: Disorder of arteries and arterioles, unspecified: I77.9

## 2014-04-19 HISTORY — DX: Peripheral vascular disease, unspecified: I73.9

## 2014-04-19 HISTORY — DX: Other specified health status: Z78.9

## 2014-04-19 HISTORY — DX: Ischemic cardiomyopathy: I25.5

## 2014-04-19 LAB — HEPATIC FUNCTION PANEL
ALT: 20 U/L (ref 0–53)
AST: 26 U/L (ref 0–37)
Albumin: 3.9 g/dL (ref 3.5–5.2)
Alkaline Phosphatase: 73 U/L (ref 39–117)
BILIRUBIN INDIRECT: 0.7 mg/dL (ref 0.3–0.9)
Bilirubin, Direct: 0.1 mg/dL (ref 0.0–0.5)
Total Bilirubin: 0.8 mg/dL (ref 0.3–1.2)
Total Protein: 6.5 g/dL (ref 6.0–8.3)

## 2014-04-19 LAB — GLUCOSE, CAPILLARY
GLUCOSE-CAPILLARY: 171 mg/dL — AB (ref 70–99)
GLUCOSE-CAPILLARY: 158 mg/dL — AB (ref 70–99)
Glucose-Capillary: 100 mg/dL — ABNORMAL HIGH (ref 70–99)

## 2014-04-19 LAB — TROPONIN I
TROPONIN I: 0.04 ng/mL — AB (ref <0.031)
TROPONIN I: 0.19 ng/mL — AB (ref <0.031)
TROPONIN I: 0.33 ng/mL — AB (ref <0.031)

## 2014-04-19 LAB — BASIC METABOLIC PANEL
Anion gap: 12 (ref 5–15)
BUN: 52 mg/dL — ABNORMAL HIGH (ref 6–23)
CO2: 30 mmol/L (ref 19–32)
Calcium: 9.7 mg/dL (ref 8.4–10.5)
Chloride: 95 mmol/L — ABNORMAL LOW (ref 96–112)
Creatinine, Ser: 5.68 mg/dL — ABNORMAL HIGH (ref 0.50–1.35)
GFR calc Af Amer: 10 mL/min — ABNORMAL LOW (ref >90)
GFR calc non Af Amer: 9 mL/min — ABNORMAL LOW (ref >90)
Glucose, Bld: 150 mg/dL — ABNORMAL HIGH (ref 70–99)
POTASSIUM: 3.8 mmol/L (ref 3.5–5.1)
Sodium: 137 mmol/L (ref 135–145)

## 2014-04-19 LAB — CBC
HEMATOCRIT: 33.8 % — AB (ref 39.0–52.0)
HEMOGLOBIN: 11.3 g/dL — AB (ref 13.0–17.0)
MCH: 31.2 pg (ref 26.0–34.0)
MCHC: 33.4 g/dL (ref 30.0–36.0)
MCV: 93.4 fL (ref 78.0–100.0)
Platelets: 203 10*3/uL (ref 150–400)
RBC: 3.62 MIL/uL — ABNORMAL LOW (ref 4.22–5.81)
RDW: 15.5 % (ref 11.5–15.5)
WBC: 8.8 10*3/uL (ref 4.0–10.5)

## 2014-04-19 LAB — PROTIME-INR
INR: 1.09 (ref 0.00–1.49)
Prothrombin Time: 14.2 seconds (ref 11.6–15.2)

## 2014-04-19 LAB — I-STAT TROPONIN, ED: Troponin i, poc: 0.01 ng/mL (ref 0.00–0.08)

## 2014-04-19 LAB — T4, FREE: Free T4: 1.35 ng/dL (ref 0.80–1.80)

## 2014-04-19 LAB — TSH: TSH: 0.164 u[IU]/mL — ABNORMAL LOW (ref 0.350–4.500)

## 2014-04-19 LAB — HEPARIN LEVEL (UNFRACTIONATED): Heparin Unfractionated: 0.37 IU/mL (ref 0.30–0.70)

## 2014-04-19 MED ORDER — SEVELAMER CARBONATE 800 MG PO TABS
2400.0000 mg | ORAL_TABLET | Freq: Three times a day (TID) | ORAL | Status: DC
  Administered 2014-04-19 – 2014-04-21 (×4): 2400 mg via ORAL
  Filled 2014-04-19 (×11): qty 3

## 2014-04-19 MED ORDER — SODIUM CHLORIDE 0.9 % IJ SOLN
3.0000 mL | Freq: Two times a day (BID) | INTRAMUSCULAR | Status: DC
  Administered 2014-04-19: 3 mL via INTRAVENOUS

## 2014-04-19 MED ORDER — ACETAMINOPHEN 325 MG PO TABS: 650.0000 mg | ORAL_TABLET | ORAL | Status: DC | PRN

## 2014-04-19 MED ORDER — ASPIRIN 81 MG PO CHEW
324.0000 mg | CHEWABLE_TABLET | Freq: Once | ORAL | Status: AC
  Administered 2014-04-19: 324 mg via ORAL
  Filled 2014-04-19: qty 4

## 2014-04-19 MED ORDER — DARBEPOETIN ALFA 25 MCG/0.42ML IJ SOSY: 25.0000 ug | PREFILLED_SYRINGE | INTRAMUSCULAR | Status: DC

## 2014-04-19 MED ORDER — INSULIN ASPART 100 UNIT/ML ~~LOC~~ SOLN
0.0000 [IU] | Freq: Three times a day (TID) | SUBCUTANEOUS | Status: DC
  Administered 2014-04-20: 18:00:00 2 [IU] via SUBCUTANEOUS
  Administered 2014-04-21: 1 [IU] via SUBCUTANEOUS

## 2014-04-19 MED ORDER — NITROGLYCERIN 0.4 MG SL SUBL: 0.4000 mg | SUBLINGUAL_TABLET | SUBLINGUAL | Status: DC | PRN

## 2014-04-19 MED ORDER — HEPARIN BOLUS VIA INFUSION
3000.0000 [IU] | Freq: Once | INTRAVENOUS | Status: AC
  Administered 2014-04-19: 3000 [IU] via INTRAVENOUS
  Filled 2014-04-19: qty 3000

## 2014-04-19 MED ORDER — TEMAZEPAM 7.5 MG PO CAPS
7.5000 mg | ORAL_CAPSULE | Freq: Every evening | ORAL | Status: DC | PRN
  Administered 2014-04-19 – 2014-04-20 (×2): 7.5 mg via ORAL
  Filled 2014-04-19 (×2): qty 1

## 2014-04-19 MED ORDER — NITROGLYCERIN IN D5W 200-5 MCG/ML-% IV SOLN
10.0000 ug/min | INTRAVENOUS | Status: DC
  Administered 2014-04-19: 10 ug/min via INTRAVENOUS
  Administered 2014-04-20: 20 ug/min via INTRAVENOUS
  Filled 2014-04-19 (×2): qty 250

## 2014-04-19 MED ORDER — SODIUM CHLORIDE 0.9 % IV SOLN: 250.0000 mL | INTRAVENOUS | Status: DC | PRN

## 2014-04-19 MED ORDER — CLOPIDOGREL BISULFATE 75 MG PO TABS
75.0000 mg | ORAL_TABLET | Freq: Two times a day (BID) | ORAL | Status: DC
  Filled 2014-04-19: qty 1

## 2014-04-19 MED ORDER — ASPIRIN 81 MG PO CHEW
81.0000 mg | CHEWABLE_TABLET | Freq: Every day | ORAL | Status: DC
  Administered 2014-04-20 – 2014-04-21 (×2): 81 mg via ORAL
  Filled 2014-04-19 (×2): qty 1

## 2014-04-19 MED ORDER — SODIUM CHLORIDE 0.9 % IJ SOLN: 3.0000 mL | INTRAMUSCULAR | Status: DC | PRN

## 2014-04-19 MED ORDER — CARVEDILOL 12.5 MG PO TABS
25.0000 mg | ORAL_TABLET | Freq: Two times a day (BID) | ORAL | Status: DC
  Administered 2014-04-19 – 2014-04-21 (×4): 25 mg via ORAL
  Filled 2014-04-19: qty 2
  Filled 2014-04-19: qty 1
  Filled 2014-04-19 (×2): qty 2
  Filled 2014-04-19 (×3): qty 1

## 2014-04-19 MED ORDER — ASPIRIN 81 MG PO CHEW: 81.0000 mg | CHEWABLE_TABLET | ORAL | Status: DC

## 2014-04-19 MED ORDER — SODIUM CHLORIDE 0.9 % IV SOLN: 62.5000 mg | INTRAVENOUS | Status: DC

## 2014-04-19 MED ORDER — LEVOTHYROXINE SODIUM 125 MCG PO TABS
125.0000 ug | ORAL_TABLET | Freq: Every day | ORAL | Status: DC
  Administered 2014-04-20 – 2014-04-21 (×2): 125 ug via ORAL
  Filled 2014-04-19 (×5): qty 1

## 2014-04-19 MED ORDER — RENA-VITE PO TABS
1.0000 | ORAL_TABLET | Freq: Every day | ORAL | Status: DC
  Administered 2014-04-19 – 2014-04-20 (×2): 1 via ORAL
  Filled 2014-04-19 (×4): qty 1

## 2014-04-19 MED ORDER — ATORVASTATIN CALCIUM 40 MG PO TABS
40.0000 mg | ORAL_TABLET | Freq: Every day | ORAL | Status: DC
  Administered 2014-04-19 – 2014-04-20 (×2): 40 mg via ORAL
  Filled 2014-04-19 (×4): qty 1

## 2014-04-19 MED ORDER — HEPARIN (PORCINE) IN NACL 100-0.45 UNIT/ML-% IJ SOLN
1100.0000 [IU]/h | INTRAMUSCULAR | Status: DC
  Administered 2014-04-19: 900 [IU]/h via INTRAVENOUS
  Filled 2014-04-19 (×3): qty 250

## 2014-04-19 MED ORDER — DOXERCALCIFEROL 4 MCG/2ML IV SOLN
1.0000 ug | INTRAVENOUS | Status: DC
  Administered 2014-04-20: 1 ug via INTRAVENOUS
  Filled 2014-04-19: qty 2

## 2014-04-19 MED ORDER — CLOPIDOGREL BISULFATE 75 MG PO TABS
75.0000 mg | ORAL_TABLET | Freq: Two times a day (BID) | ORAL | Status: DC
  Administered 2014-04-19 – 2014-04-21 (×4): 75 mg via ORAL
  Filled 2014-04-19 (×6): qty 1

## 2014-04-19 MED ORDER — SODIUM CHLORIDE 0.9 % IJ SOLN: 3.0000 mL | Freq: Two times a day (BID) | INTRAMUSCULAR | Status: DC

## 2014-04-19 MED ORDER — ONDANSETRON HCL 4 MG/2ML IJ SOLN: 4.0000 mg | Freq: Four times a day (QID) | INTRAMUSCULAR | Status: DC | PRN

## 2014-04-19 NOTE — Plan of Care (Signed)
Problem: Phase I Progression Outcomes Goal: Voiding-avoid urinary catheter unless indicated Outcome: Completed/Met Date Met:  04/19/14 Patient oliguric

## 2014-04-19 NOTE — ED Notes (Signed)
Cardiology at bedside.

## 2014-04-19 NOTE — ED Provider Notes (Signed)
CSN: [id number]     Arrival date & time 04/19/14  [id number] History   First MD Initiated Contact with Patient 04/19/14 [phone]     Chief Complaint  Patient presents with  . Chest Pain     (Consider location/radiation/quality/duration/timing/severity/associated sxs/prior Treatment) Patient is a 74 y.o. male presenting with chest pain. The history is provided by the patient.  Chest Pain He has been having chest pain for the last 4 days. Pain is typical of his angina and has been relieved with nitroglycerin. He usually gets relief of pain with one nitroglycerin, but has occasionally needed to. He was awakened at 3:30 AM today with chest pain which did take 2 nitroglycerin to achieve complete relief. Pain episodes have been coming at rest. There has been no associated dyspnea, nausea, diaphoresis. He took his usual dose of aspirin 81 mg at bedtime. Symptoms are very similar to what he had died 2 months ago when he had occlusion of one of his cardiac stents which was reopened at time of catheterization. He states that this is the fifth time that the stent has closed off in the last year. He is a dialysis patient and is scheduled for dialysis tomorrow.  Past Medical History  Diagnosis Date  . Diabetes mellitus   . Hyperlipidemia   . Hypertension   . Gout   . Hypothyroidism     On supplementation  . LBBB (left bundle branch block)     Chronic  . History of colon cancer 2003    colectomy for CA. no recurrence . never required  radiation or chem  . Anemia     Likely secondary to history of GI bleed  . Stroke 1998  . End stage renal disease on dialysis     Dialyzes at [facility]: Tues/Th/Sat - left upper cavity AV fistula brachiocephalic  . CAD in native artery; and the grafts 1992, 1997, 2002, 2006, 2012    CABG 1992 and redo CABG 2006  . CAD (coronary artery disease) of bypass graft 2006, 2012    In 2006: Occluded SVG-OM noted (SVG-diagonal was previously occluded); 2012: Severe lesion in  redo SVG-OM1 --> BMS PCI   . CAD S/P percutaneous coronary angioplasty October 2012; Feb, Apr & Oct 2015; Feb 2016    a) 10/'12: NSTEMI: PCI to SVG-OM1 Integrity BMS 3.5 x 15 (3.85 mm);;b) 2/'15: UA - PCI mid RCA Xience Ap DES 2.75 x 15 (3.1 mm);; c) 4/'15 : UA - focal dRCA Resolute DES 2.25 x 8 (2.5 mm);; d) 10/'15: PCI to mRCA ISR w/ post stent lesion - Promus P 2.75 x 16 (3.25 mm); e) 2/'16: NSTEMI: Cutting Balloon PTCA of  mRCA ISR (3.5 mm post-dilation)  . History of nuclear stress test Sept 2014    EF 39% (down from) 45%, no ischemia; but now the inferobasal perfusion defect is determined to be likley prior infarction (as opposed to diaphragmatic attenuation)  . Echocardiogram findings abnormal, without diagnosis [id number]; Oct 2014    EF 50-55%, anteroseptal hypokinesis. Mild to moderate concentric LVH. Mild MR, aortic sclerosis. Mild atrial dilatation bilaterally.; Oct - mild LVH, EF ~50-55%, incoordinate septal WM, - with no other notable WMA  . History of Non-ST elevated myocardial infarction (non-STEMI) 1992, 2006, 2012; 02/2013  . Peptic ulcer disease  2004  . BPH (benign prostatic hyperplasia)   . Chronic low back pain    Past Surgical History  Procedure Laterality Date  . Appendectomy    . Cholecystectomy  2009  .  Eye surgery      bilateral cataracts   . Spinal fusion    . Av fistula repair Left     5/11  . Colon resection      transverse and proximal descending w/ primar anastomosis  . Doppler echocardiography  01/25/2012    EF 50 to 55%  . Nm myocar perf wall motion  10/12/2011    EF 54%,LV normal ; no signifiant ischemia  . Cardiac catheterization  10/16/2010    patent  LIMA to the LAD , PATENT  svg TO 2nd marginals ,occluded PLA with collaterals and SVG to the OM  had 90% stenosis  was txlge  bare - metal  3.5  Integrity ten  postdilate  36-37  mm    . Cardiac catheterization  03/22/2004    loss of both SVG,severe disease in prox and ostial  circ not amenable to invention.  mod diease distal LAD  AFTER BYPASS GRAFT;-CVTS to evaluate   . Coronary angioplasty  04/03/1995    OM and Circ  . Coronary angioplasty  02/01/2000    [facility]  . Carotid doppler  05/23/2012    ABN CAROTID-- RGT BULB/PROX ICA mild to mod 50-60%;lft bulb/prox mild to mod 0-49%;left subclavian abn waveforms consistent with patients lft arm A/V fistula  . Event monitor  01/23/2012-02/06/2012    SINUS ,LBBB,unifocal PVCs  . Coronary artery bypass graft  08/22/1990    INITIAL:LIMA to LAD, SVG to  Diagonal, SVG to OM  . Coronary artery bypass graft  2006    SVG to OM1,SVG to OM2 with patent LIMA  to LAD and occluded vein  graft to the diagonal  and occluded vein grft to OM from  prior  surgery  . Percutaneous coronary stent intervention (pci-s)  02/23/2013    mid RCA 80% & 70% - Xience 2.75 mm x 15 mm ( 3.81mm) ; LIMA-LAD patent, SVG-OM patent (stent patent); SVG-Diag patent.  . Transthoracic echocardiogram  02/23/2013    Moderate concentric hypertrophy. EF 4500%. Septal bounce. Grade 2 diastolic dysfunction (pseudo-normal - severely elevated filling pressures) mild to moderate MR and moderate LA dilation to  . Percutaneous coronary stent intervention (pci-s)  April 2015    dRCA - Integrity Resolute DES   2.25 mm x 63mm (2.5 mm)  . Percutaneous coronary stent intervention (pci-s)  Oct 15 2013    [place] Angina: mRCA ISR with post-stent stenosis -- Cuting PTCA & PCI Promus Premier DES 2.75 mm x 16 mm (3.25 mm)  . Lower extremity arterial dopplers  October 2014    Calcified but non-occlusive peripheral arteries. No evidence of stenosis  . Left heart catheterization with coronary angiogram N/A 02/23/2013    Procedure: LEFT HEART CATHETERIZATION WITH CORONARY ANGIOGRAM;  Surgeon: Lorretta Harp, MD;  [facility] C[facility] LAB;  Service: Cardiovascular;  Laterality: N/A;  . Left heart catheterization with coronary/graft angiogram N/A 04/30/2013    Procedure: LEFT HEART CATHETERIZATION WITH Beatrix Fetters;  Surgeon: Leonie Man, MD;  Location: [facility] CATH LAB;  Service: Cardiovascular;  Laterality: N/A;  . Left heart catheterization with coronary/graft angiogram N/A 10/15/2013    Procedure: LEFT HEART CATHETERIZATION WITH Beatrix Fetters;  Surgeon: Leonie Man, MD;  Location: [facility] CATH LAB;  Service: Cardiovascular;  Laterality: N/A;  . Colon surgery    . Left heart catheterization with coronary/graft angiogram N/A 02/09/2014    Procedure: LEFT HEART CATHETERIZATION WITH Beatrix Fetters;  Surgeon: Leonie Man, MD;  Location: [facility] CATH LAB;  Service:  Cardiovascular;  Laterality: N/A;  . Coronary angioplasty  02/09/14    Cutting Balloon PTCA of mRCA ISR 99% - 3.5 mm   Family History  Problem Relation Age of Onset  . Heart disease Mother   . Hypertension Mother   . Diabetes Mother   . Heart disease Father   . Hypertension Father   . Diabetes Father    History  Substance Use Topics  . Smoking status: Never Smoker   . Smokeless tobacco: Never Used  . Alcohol Use: No    Review of Systems  Cardiovascular: Positive for chest pain.  All other systems reviewed and are negative.     Allergies  Shellfish allergy  Home Medications   Prior to Admission medications   Medication Sig Start Date End Date Taking? Authorizing Provider  allopurinol (ZYLOPRIM) 100 MG tablet Take 100 mg by mouth daily.    Historical Provider, MD  aspirin 81 MG chewable tablet Chew 81 mg by mouth at bedtime.    Historical Provider, MD  atorvastatin (LIPITOR) 40 MG tablet Take 40 mg by mouth at bedtime.    Historical Provider, MD  Besifloxacin HCl (BESIVANCE) 0.6 % SUSP Place 1 drop into both eyes See admin instructions. Use eye drops 4 times daily for 2 days following injection by Dr. Zigmund Daniel (once a month)    Historical Provider, MD  carvedilol (COREG) 25 MG tablet Take 1 tablet (25 mg total) by mouth 2 (two) times daily with a meal. 03/15/14   Midge Minium, MD  clopidogrel  (PLAVIX) 75 MG tablet Take 1 tablet (75 mg total) by mouth daily. 03/10/14   Leonie Man, MD  glucose blood (PRODIGY NO CODING BLOOD GLUC) test strip Test as directed 12/17/11   Midge Minium, MD  insulin NPH Human (HUMULIN N,NOVOLIN N) 100 UNIT/ML injection Inject 10-20 Units into the skin 2 (two) times daily. Take 10 units if blood sugar is 130-150.  Take 20 units if blood sugar is >150.    Historical Provider, MD  isosorbide mononitrate (IMDUR) 60 MG 24 hr tablet Take 60 mg by mouth 2 (two) times daily. 07/15/13   Midge Minium, MD  levothyroxine (SYNTHROID, LEVOTHROID) 125 MCG tablet Take 1 tablet (125 mcg total) by mouth daily before breakfast. 11/06/13   Midge Minium, MD  lidocaine-prilocaine (EMLA) cream Apply 1 application topically daily as needed (prior to dialysis treatments).     Historical Provider, MD  loperamide (IMODIUM) 2 MG capsule Take 2 mg by mouth daily as needed for diarrhea or loose stools.     Historical Provider, MD  multivitamin (RENA-VIT) TABS tablet Take 1 tablet by mouth daily. 12/17/11   Midge Minium, MD  nitroGLYCERIN (NITROSTAT) 0.4 MG SL tablet Place 1 tablet (0.4 mg total) under the tongue every 5 (five) minutes as needed for chest pain. 11/18/13   Midge Minium, MD  sevelamer carbonate (RENVELA) 800 MG tablet Take 3(three)  800mg  tablets by mouth with every meal.    Historical Provider, MD  terazosin (HYTRIN) 2 MG capsule Take 1 capsule (2 mg total) by mouth at bedtime. 11/06/13   Midge Minium, MD  trimethoprim-polymyxin b (POLYTRIM) ophthalmic solution Place 1 drop into both eyes as needed.  09/21/13   Historical Provider, MD   BP 151/61 mmHg  Pulse 65  Temp(Src) 97.8 F (36.6 C) (Oral)  Resp 12  Ht 5\' 5"  (1.651 m)  Wt 160 lb (72.576 kg)  BMI 26.63 kg/m2  SpO2 100% Physical  Exam  Nursing note and vitals reviewed.  74 year old male, resting comfortably and in no acute distress. Vital signs are significant for hypertension.  Oxygen saturation is 100%, which is normal. Head is normocephalic and atraumatic. PERRLA, EOMI. Oropharynx is clear. Neck is nontender and supple without adenopathy or JVD. Back is nontender and there is no CVA tenderness. Lungs are clear without rales, wheezes, or rhonchi. Chest is nontender. Heart has regular rate and rhythm without murmur. Abdomen is soft, flat, nontender without masses or hepatosplenomegaly and peristalsis is normoactive. Extremities have no cyanosis or edema, full range of motion is present. AV fistula is present in the left upper arm with thrill present. Skin is warm and dry without rash. Neurologic: Mental status is normal, cranial nerves are intact, there are no motor or sensory deficits.  ED Course  Procedures (including critical care time) Labs Review Results for orders placed or performed during the hospital encounter of 04/19/14  CBC  Result Value Ref Range   WBC 8.8 4.0 - 10.5 K/uL   RBC 3.62 (L) 4.22 - 5.81 MIL/uL   Hemoglobin 11.3 (L) 13.0 - 17.0 g/dL   HCT 33.8 (L) 39.0 - 52.0 %   MCV 93.4 78.0 - 100.0 fL   MCH 31.2 26.0 - 34.0 pg   MCHC 33.4 30.0 - 36.0 g/dL   RDW 15.5 11.5 - 15.5 %   Platelets 203 150 - 400 K/uL  Basic metabolic panel  Result Value Ref Range   Sodium 137 135 - 145 mmol/L   Potassium 3.8 3.5 - 5.1 mmol/L   Chloride 95 (L) 96 - 112 mmol/L   CO2 30 19 - 32 mmol/L   Glucose, Bld 150 (H) 70 - 99 mg/dL   BUN 52 (H) 6 - 23 mg/dL   Creatinine, Ser 5.68 (H) 0.50 - 1.35 mg/dL   Calcium 9.7 8.4 - 10.5 mg/dL   GFR calc non Af Amer 9 (L) >90 mL/min   GFR calc Af Amer 10 (L) >90 mL/min   Anion gap 12 5 - 15  I-stat troponin, ED (not at [facility])  Result Value Ref Range   Troponin i, poc 0.01 0.00 - 0.08 ng/mL   Comment 3            Imaging Review Dg Chest Port 1 View  04/19/2014   CLINICAL DATA:  Mid chest pain this morning. History of coronary artery stenting. History of diabetes, hypertension.  EXAM: PORTABLE CHEST - 1 VIEW   COMPARISON:  Chest radiograph February 08, 2014  FINDINGS: The cardiac silhouette is mildly enlarged, status post median sternotomy for CABG. No pleural effusion or focal consolidation. No pneumothorax. LEFT subclavian vascular stent is new from prior chest radiograph. Osseous structure nonsuspicious.  IMPRESSION: Mild cardiomegaly, no acute pulmonary process.  New LEFT subclavian stent.   Electronically Signed   By: Elon Alas   On: 04/19/2014 06:15     EKG Interpretation   Date/Time:  Monday April 19 2014 05:19:42 EDT Ventricular Rate:  66 PR Interval:  192 QRS Duration: 177 QT Interval:  510 QTC Calculation: 534 R Axis:   5 Text Interpretation:  Sinus rhythm Left bundle branch block When compared  with ECG of 02/10/2014, No significant change was found Confirmed by [facility]   MD, Naylah Cork ([id number]) on 04/19/2014 5:22:42 AM      MDM   Final diagnoses:  Chest pain, unspecified chest pain type  Unstable angina  End-stage renal disease on hemodialysis    Unstable angina  in patient with known coronary artery disease. Review of past records confirms recent hospitalization for unstable angina is due to in-stent stenosis of stent in the right coronary artery. ECG shows left bundle branch block and therefore is not expected to show acute changes. He is started on heparin and nitroglycerin drips and given additional aspirin. Cardiology will be consulted for hospital admission.    Delora Fuel, MD [id number] [phone]

## 2014-04-19 NOTE — Progress Notes (Signed)
Patient arrived to the unit from the ED. Patient A/O, VSS, NSR on monitor, pain 0/10. Orders reviewed and POC discussed with patient. Patient currently resting comfortably in bed. afleming RN

## 2014-04-19 NOTE — Progress Notes (Signed)
Report received from [employer] afleming RN

## 2014-04-19 NOTE — ED Notes (Signed)
Pt reports CP since 03:30 this morning; pt reports taking 4 SL nitro pills PTA with improvement; pt reports also taking an 81mg  ASA; pt denies palpitations, sob, n/v

## 2014-04-19 NOTE — Progress Notes (Signed)
Cath lab informed us they are unable to do case today. Cath moved to tomorrow at 10:30am. Patient will be made aware. Nursing informed me of an episode of recurrent CP the patient had earlier, relieved with titration of IV NTG. He is now CP free again. Will continue to monitor closely. If chest pain accelerates further may need to consider sooner cath but for now he seems to have cooled off. I also asked nursing to notify renal of change in cath time so we can work dialysis around the procedure.  Dayna Dunn PA-C

## 2014-04-19 NOTE — H&P (Signed)
History and Physical  Patient ID: [name] [name] MRN: [id number], DOB: Apr 20, 1940 Date of Encounter: 04/19/2014, 7:22 AM Primary Physician: Annye Asa, MD Primary Cardiologist: Ellyn Hack  Chief Complaint: chest pain Reason for Admission: chest pain  HPI: [name] [name] is a 74 y/o M with extensive history of CAD (s/p CABG 1992/redo 2006), ICM, ESRD on HD, HTN, HLD, DM, chronic LBBB, PUD, stroke who presents to [facility] with chest pain reminiscent of prior angina. He had CABG 1992 and redo in 2006 with extensive PCIs as listed below (PMH). He was most recently admitted 02/2014 with crescendo angina and NSTEMI. And underwent successful difficult PTCA to severe mid RCA ISR, expanded to 3.2mm. He has a known occluded native LCA with widely patent LIMA-LAD, SVG-OM1 and SVG-OM2. Antiplatelet was changed to Brilinta. He was seen by Dr. Ellyn Hack in followup on 02/12/14 who stated "the most significant episodes of angina he's been having over the last year having involved his RCA lesions. The last 2 interventions have been roughly the same spot. We could consider the possibility of breaking therapy in the future [sic: brachiotherapy]. I am concerned however that he may eventually shut down his RCA. I don't think he would be a candidate for a third redo CABG." The patient called in last month requesting to change back to Plavix due to SOB and cost with Brilinta. He is not able to be on Effient due to history of stroke. He does feel like going back to Plavix improved his breathlessness.  However, on Thursday (4/7) he developed more frequent episodes of chest pain. They feel similar to his prior angina, described as dull substernal chest pressure radiating up to his neck. It occurs both at rest and with exertion. It lasts several minutes until he takes NTG - he has been taking SL NTG up to 10x per day with relief of discomfort. He denies any SOB but says he has been stopping any activity when the pain comes  on to avoid getting to the point where SOB would develop. No nausea, vomiting, diarrhea, bleeding, melena, LEE, orthopnea. Pain is not worse with inspiration, palpation or recumbency. He came to the ER this morning due to increasing frequency of sx. Labs pertinent for Hgb 11.3 (stable), Cr 5.68, K 3.8 (dialyzes T/T/Sat), troponin neg x 1. CXR mild cardiomegaly, no acute pulmonary process, new left subclavian stent (?). EKG shows chronic LBBB. Last echo 02/2013: mod LVH, EF 45-50%, grade 2 DD, severely elev LVEDP, mild AI, mild-mod MR, mod LAE, mild TR. He is on NTG gtt at 51mcg/min and was started on heparin by the ER. Received ASA 324mg  this AM. He is currently pain free and without acute complaint. Of note P2Y12 on 02/08/14 was 264.   Past Medical History  Diagnosis Date  . Diabetes mellitus   . Hyperlipidemia   . Hypertension   . Gout   . Hypothyroidism     On supplementation  . LBBB (left bundle branch block)     Chronic  . History of colon cancer 2003    colectomy for CA. no recurrence . never required  radiation or chem  . Anemia     Likely secondary to history of GI bleed  . Stroke 1998  . End stage renal disease on dialysis     Dialyzes at [facility]: Tues/Th/Sat - left upper cavity AV fistula brachiocephalic  . CAD in native artery; and the grafts 1992, 1997, 2002, 2006, 2012    CABG 1992 and redo  CABG 2006  . CAD (coronary artery disease) of bypass graft 2006, 2012    In 2006: Occluded SVG-OM noted (SVG-diagonal was previously occluded); 2012: Severe lesion in redo SVG-OM1 --> BMS PCI   . CAD S/P percutaneous coronary angioplasty October 2012; March 16, 2022, Apr & Oct 2015; 03/16/14    a) s/p CABG 1992 and redo 2006. b) 10/'12: NSTEMI: PCI to SVG-OM1 Integrity BMS 3.5 x 15 (3.85 mm) c) 2/'15: UA - PCI mid RCA Xience Ap DES 2.75 x 15 (3.1 mm). d) 4/'15 : UA - focal dRCA Resolute DES 2.25 x 8 (2.5 mm). d) 10/'15: PCI to mRCA ISR w/ post stent lesion - Promus P 2.75 x 16 (3.25 mm). e) 2/'16:  NSTEMI: Cutting Balloon PTCA of  mRCA ISR (3.5 mm post-dilation).  . Ischemic cardiomyopathy   . History of Non-ST elevated myocardial infarction (non-STEMI) 1992, 2006, 201209-Mar-2015  . Peptic ulcer disease  2004  . BPH (benign prostatic hyperplasia)   . Chronic low back pain   . Carotid arterial disease     Duplex 05/2012: R BULB/PROX ICA: 50-69%, L BULB/PROX ICA: 0-49%.      Most Recent Cardiac Studies: See long cath note from 03/16/2014 in [place].  2D Echo 02/23/13 - Left ventricle: There is false tendon in the apex. There is paradoxical septal motion. The cavity size was normal. There was moderate concentric hypertrophy. Systolic function was mildly reduced. The estimated ejection fraction was in the range of 45% to 50%. Features are consistent with a pseudonormal left ventricular filling pattern, with concomitant abnormal relaxation and increased filling pressure (grade 2 diastolic dysfunction). Doppler parameters are consistent with severely elevated ventricular end-diastolic filling pressure. - Ventricular septum: Septal motion showed paradox. - Aortic valve: Mild regurgitation. - Mitral valve: Mild to moderate regurgitation. - Left atrium: The atrium was moderately dilated. - Right ventricle: Systolic function was normal. - Right atrium: The atrium was normal in size. - Tricuspid valve: Mild regurgitation. - Pulmonic valve: No regurgitation. - Pulmonary arteries: Systolic pressure was within the normal range. - Inferior vena cava: The vessel was normal in size. - Pericardium, extracardiac: There was no pericardial effusion. Transthoracic    Surgical History:  Past Surgical History  Procedure Laterality Date  . Appendectomy    . Cholecystectomy  2009  . Eye surgery      bilateral cataracts   . Spinal fusion    . Av fistula repair Left     5/11  . Colon resection      transverse and proximal descending w/ primar anastomosis  . Doppler  echocardiography  01/25/2012    EF 50 to 55%  . Nm myocar perf wall motion  10/12/2011    EF 54%,LV normal ; no signifiant ischemia  . Cardiac catheterization  10/16/2010    patent  LIMA to the LAD , PATENT  svg TO 2nd marginals ,occluded PLA with collaterals and SVG to the OM  had 90% stenosis  was txlge  bare - metal  3.5  Integrity ten  postdilate  36-37  mm    . Cardiac catheterization  03/22/2004    loss of both SVG,severe disease in prox and ostial  circ not amenable to invention. mod diease distal LAD  AFTER BYPASS GRAFT;-CVTS to evaluate   . Coronary angioplasty  04/03/1995    OM and Circ  . Coronary angioplasty  02/01/2000    CIRC  . Carotid doppler  05/23/2012    ABN CAROTID-- RGT BULB/PROX ICA mild to  mod 50-60%;lft bulb/prox mild to mod 0-49%;left subclavian abn waveforms consistent with patients lft arm A/V fistula  . Event monitor  01/23/2012-02/06/2012    SINUS ,LBBB,unifocal PVCs  . Coronary artery bypass graft  08/22/1990    INITIAL:LIMA to LAD, SVG to  Diagonal, SVG to OM  . Coronary artery bypass graft  2006    SVG to OM1,SVG to OM2 with patent LIMA  to LAD and occluded vein  graft to the diagonal  and occluded vein grft to OM from  prior  surgery  . Percutaneous coronary stent intervention (pci-s)  02/23/2013    mid RCA 80% & 70% - Xience 2.75 mm x 15 mm ( 3.58mm) ; LIMA-LAD patent, SVG-OM patent (stent patent); SVG-Diag patent.  . Transthoracic echocardiogram  02/23/2013    Moderate concentric hypertrophy. EF 4500%. Septal bounce. Grade 2 diastolic dysfunction (pseudo-normal - severely elevated filling pressures) mild to moderate MR and moderate LA dilation to  . Percutaneous coronary stent intervention (pci-s)  April 2015    dRCA - Integrity Resolute DES   2.25 mm x 85mm (2.5 mm)  . Percutaneous coronary stent intervention (pci-s)  Oct 15 2013    [place] Angina: mRCA ISR with post-stent stenosis -- Cuting PTCA & PCI Promus Premier DES 2.75 mm x 16 mm (3.25 mm)  . Lower  extremity arterial dopplers  October 2014    Calcified but non-occlusive peripheral arteries. No evidence of stenosis  . Left heart catheterization with coronary angiogram N/A 02/23/2013    Procedure: LEFT HEART CATHETERIZATION WITH CORONARY ANGIOGRAM;  Surgeon: Lorretta Harp, MD;  Location: [facility] CATH LAB;  Service: Cardiovascular;  Laterality: N/A;  . Left heart catheterization with coronary/graft angiogram N/A 04/30/2013    Procedure: LEFT HEART CATHETERIZATION WITH Beatrix Fetters;  Surgeon: Leonie Man, MD;  Location: [facility] CATH LAB;  Service: Cardiovascular;  Laterality: N/A;  . Left heart catheterization with coronary/graft angiogram N/A 10/15/2013    Procedure: LEFT HEART CATHETERIZATION WITH Beatrix Fetters;  Surgeon: Leonie Man, MD;  Location: [facility] CATH LAB;  Service: Cardiovascular;  Laterality: N/A;  . Colon surgery    . Left heart catheterization with coronary/graft angiogram N/A 02/09/2014    Procedure: LEFT HEART CATHETERIZATION WITH Beatrix Fetters;  Surgeon: Leonie Man, MD;  Location: [facility] CATH LAB;  Service: Cardiovascular;  Laterality: N/A;  . Coronary angioplasty  02/09/14    Cutting Balloon PTCA of mRCA ISR 99% - 3.5 mm     Home Meds: Prior to Admission medications   Medication Sig Start Date End Date Taking? Authorizing Provider  aspirin 81 MG chewable tablet Chew 81 mg by mouth at bedtime.   Yes Historical Provider, MD  atorvastatin (LIPITOR) 40 MG tablet Take 40 mg by mouth at bedtime.   Yes Historical Provider, MD  Besifloxacin HCl (BESIVANCE) 0.6 % SUSP Place 1 drop into both eyes See admin instructions. Use eye drops 4 times daily for 2 days following injection by Dr. Zigmund Daniel (once a month)   Yes Historical Provider, MD  carvedilol (COREG) 25 MG tablet Take 1 tablet (25 mg total) by mouth 2 (two) times daily with a meal. 03/15/14  Yes Midge Minium, MD  clopidogrel (PLAVIX) 75 MG tablet Take 1 tablet (75 mg total) by mouth daily. 03/10/14   Yes Leonie Man, MD  insulin NPH Human (HUMULIN N,NOVOLIN N) 100 UNIT/ML injection Inject 10-20 Units into the skin 2 (two) times daily. Take 10 units if blood sugar is 130-150.  Take 20 units  if blood sugar is >150.   Yes Historical Provider, MD  isosorbide mononitrate (IMDUR) 60 MG 24 hr tablet Take 60 mg by mouth 2 (two) times daily. 07/15/13  Yes Midge Minium, MD  levothyroxine (SYNTHROID, LEVOTHROID) 125 MCG tablet Take 1 tablet (125 mcg total) by mouth daily before breakfast. 11/06/13  Yes Midge Minium, MD  loperamide (IMODIUM) 2 MG capsule Take 2 mg by mouth daily as needed for diarrhea or loose stools.    Yes Historical Provider, MD  nitroGLYCERIN (NITROSTAT) 0.4 MG SL tablet Place 1 tablet (0.4 mg total) under the tongue every 5 (five) minutes as needed for chest pain. 11/18/13  Yes Midge Minium, MD  sevelamer carbonate (RENVELA) 800 MG tablet Take 3(three)  800mg  tablets by mouth with every meal.   Yes Historical Provider, MD  glucose blood (PRODIGY NO CODING BLOOD GLUC) test strip Test as directed 12/17/11   Midge Minium, MD  multivitamin (RENA-VIT) TABS tablet Take 1 tablet by mouth daily. Patient not taking: Reported on 04/19/2014 12/17/11   Midge Minium, MD  terazosin (HYTRIN) 2 MG capsule Take 1 capsule (2 mg total) by mouth at bedtime. Patient not taking: Reported on 04/19/2014 11/06/13   Midge Minium, MD    Allergies:  Allergies  Allergen Reactions  . Shellfish Allergy     Causes gout flare-ups    History   Social History  . Marital Status: Married    Spouse Name: N/A  . Number of Children: 3  . Years of Education: N/A   Occupational History  . Not on file.   Social History Main Topics  . Smoking status: Never Smoker   . Smokeless tobacco: Never Used  . Alcohol Use: No  . Drug Use: No  . Sexual Activity: Not on file   Other Topics Concern  . Not on file   Social History Narrative   Long-term patient of Dr. Rollene Fare.    Married father of 86, grandfather 55.   Never smoked.   Not exercising D2 hip and back pain & now Angina     Family History  Problem Relation Age of Onset  . Heart disease Mother   . Hypertension Mother   . Diabetes Mother   . Heart disease Father   . Hypertension Father   . Diabetes Father     Review of Systems: General: negative for chills, fever, or weight changes (only the typical up-down with HD but no change of dry weight) Cardiovascular: see above Dermatological: negative for rash Respiratory: negative for cough Urologic: negative for hematuria Abdominal: negative for nausea, vomiting, bright red blood per rectum, melena, or hematemesis All other systems reviewed and are otherwise negative except as noted above.  Labs:   Lab Results  Component Value Date   WBC 8.8 04/19/2014   HGB 11.3* 04/19/2014   HCT 33.8* 04/19/2014   MCV 93.4 04/19/2014   PLT 203 04/19/2014    Recent Labs Lab 04/19/14 0531  NA 137  K 3.8  CL 95*  CO2 30  BUN 52*  CREATININE 5.68*  CALCIUM 9.7  GLUCOSE 150*   Troponin neg x 1  Lab Results  Component Value Date   CHOL 71 11/18/2013   HDL 22.40* 11/18/2013   LDLCALC 17 11/18/2013   TRIG 156.0* 11/18/2013   Radiology/Studies:  Dg Chest Port 1 View  04/19/2014   CLINICAL DATA:  Mid chest pain this morning. History of coronary artery stenting. History of diabetes, hypertension.  EXAM:  PORTABLE CHEST - 1 VIEW  COMPARISON:  Chest radiograph February 08, 2014  FINDINGS: The cardiac silhouette is mildly enlarged, status post median sternotomy for CABG. No pleural effusion or focal consolidation. No pneumothorax. LEFT subclavian vascular stent is new from prior chest radiograph. Osseous structure nonsuspicious.  IMPRESSION: Mild cardiomegaly, no acute pulmonary process.  New LEFT subclavian stent.   Electronically Signed   By: Elon Alas   On: 04/19/2014 06:15   Wt Readings from Last 3 Encounters:  04/19/14 160 lb (72.576 kg)    03/19/14 162 lb 8 oz (73.71 kg)  02/12/14 165 lb 3.2 oz (74.934 kg)    EKG: Chronic LBBB 66bpm  Physical Exam: Blood pressure 135/50, pulse 58, temperature 97.8 F (36.6 C), temperature source Oral, resp. rate 10, height 5\' 5"  (1.651 m), weight 160 lb (72.576 kg), SpO2 99 %. General: Well developed, well nourished WM, in no acute distress. Head: Normocephalic, atraumatic, sclera non-icteric, no xanthomas, nares are without discharge.  Neck: Negative for carotid bruits. JVD not elevated. Lungs: Clear bilaterally to auscultation without wheezes, rales, or rhonchi. Breathing is unlabored. Heart: Somewhat distant heart sounds, RRR with S1 S2. No significant murmurs, rubs or gallops. Abdomen: Soft, non-tender, non-distended with normoactive bowel sounds. No hepatomegaly. No rebound/guarding. No obvious abdominal masses. Msk:  Strength and tone appear normal for age. Extremities: No clubbing or cyanosis. No edema.  Distal pedal pulses are 2+ and equal bilaterally. Neuro: Alert and oriented X 3. No focal deficit. No facial asymmetry. Moves all extremities spontaneously. Psych:  Responds to questions appropriately with a normal affect.    ASSESSMENT AND PLAN:   1. Chest pain concerning for unstable angina with complex history of CAD s/p CABG/redo and multiple PCIs 2. Ischemic cardiomyopathy EF 45-50% by echo 2015, appears euvolemic 3. ESRD on HD (Tues/Thurs/Sat) 4. Chronic LBBB 5. Chronic normocytic anemia, stable 6. HTN, controlled 7. Hyperlipidemia 8. Insulin dependent diabetes mellitus  Patient seen/examined with Dr. Johnsie Cancel who recommends cardiac cath to further delineate coronary anatomy. EKG not helpful given chronic LBBB. Initial troponin is negative and he is currently CP free on IV NTG. We will feed him breakfast and make him NPO thereafter. He was placed on the add-on cath board but they are not yet sure if he will be able to be accomodated on the schedule. Continue IV heparin for  now. Will hold Imdur given that he is on IV NTG. Will hold scheduled insulin and use SSI only given need to be NPO. Since P2Y12 was 264 on Plavix Dr. Johnsie Cancel has recommended empirically increasing Plavix to 75mg  BID. The patient refuses Brilinta due to intolerance from SOB and is not a candidate for Effient due to history of stroke.  Risks and benefits of cardiac catheterization have been discussed with the patient. These include bleeding, infection, stroke, heart attack, death. The patient understands these risks and is willing to proceed.  Signed, Melina Copa PA-C 04/19/2014, 7:22 AM  Patient examined chart reviewed.  Discussed care with wife.  Crescendo angina with known CAD  CABG x2 and refractory disease in RCA.  Last intervention February.  Previous CVA unable to take Effient, Dyspnea with Effient and expensive but P2Y 264 on plavix.  Pain free now on heparin and nitro.  Recent stent to LUE fistual Normal dialysis day Tuesday/Thursday/Saturday.  ECG not acute negative troponin.  Plan cath today if possible  Also has left carotid bruit and last duplex 2014 will update while in hospital.  Risks discussed with patient willing  to proceed  Jenkins Rouge

## 2014-04-19 NOTE — Progress Notes (Signed)
[name] [name] in dia;ysis notified that patient has heart cath scheduled for tomorrow morning. afleming RN

## 2014-04-19 NOTE — Progress Notes (Signed)
ANTICOAGULATION CONSULT NOTE - Initial Consult  Pharmacy Consult for heparin Indication: chest pain/ACS  Allergies  Allergen Reactions  . Shellfish Allergy     Causes gout flare-ups    Patient Measurements: Height: 5\' 5"  (165.1 cm) Weight: 160 lb (72.576 kg) IBW/kg (Calculated) : 61.5   Vital Signs: Temp: 97.8 F (36.6 C) (04/11 0519) Temp Source: Oral (04/11 0519) BP: 151/61 mmHg (04/11 0519) Pulse Rate: 65 (04/11 0519)  Labs:  Recent Labs  04/19/14 0531  HGB 11.3*  HCT 33.8*  PLT 203  CREATININE 5.68*    Estimated Creatinine Clearance: 10.2 mL/min (by C-G formula based on Cr of 5.68).   Medical History: Past Medical History  Diagnosis Date  . Diabetes mellitus   . Hyperlipidemia   . Hypertension   . Gout   . Hypothyroidism     On supplementation  . LBBB (left bundle branch block)     Chronic  . History of colon cancer 2003    colectomy for CA. no recurrence . never required  radiation or chem  . Anemia     Likely secondary to history of GI bleed  . Stroke 1998  . End stage renal disease on dialysis     Dialyzes at [facility]: Tues/Th/Sat - left upper cavity AV fistula brachiocephalic  . CAD in native artery; and the grafts 1992, 1997, 2002, 2006, 2012    CABG 1992 and redo CABG 2006  . CAD (coronary artery disease) of bypass graft 2006, 2012    In 2006: Occluded SVG-OM noted (SVG-diagonal was previously occluded); 2012: Severe lesion in redo SVG-OM1 --> BMS PCI   . CAD S/P percutaneous coronary angioplasty October 2012; Feb, Apr & Oct 2015; Feb 2016    a) 10/'12: NSTEMI: PCI to SVG-OM1 Integrity BMS 3.5 x 15 (3.85 mm);;b) 2/'15: UA - PCI mid RCA Xience Ap DES 2.75 x 15 (3.1 mm);; c) 4/'15 : UA - focal dRCA Resolute DES 2.25 x 8 (2.5 mm);; d) 10/'15: PCI to mRCA ISR w/ post stent lesion - Promus P 2.75 x 16 (3.25 mm); e) 2/'16: NSTEMI: Cutting Balloon PTCA of  mRCA ISR (3.5 mm post-dilation)  . History of nuclear stress test Sept 2014    EF 39%  (down from) 45%, no ischemia; but now the inferobasal perfusion defect is determined to be likley prior infarction (as opposed to diaphragmatic attenuation)  . Echocardiogram findings abnormal, without diagnosis [id number]; Oct 2014    EF 50-55%, anteroseptal hypokinesis. Mild to moderate concentric LVH. Mild MR, aortic sclerosis. Mild atrial dilatation bilaterally.; Oct - mild LVH, EF ~50-55%, incoordinate septal WM, - with no other notable WMA  . History of Non-ST elevated myocardial infarction (non-STEMI) 1992, 2006, 2012; 02/2013  . Peptic ulcer disease  2004  . BPH (benign prostatic hyperplasia)   . Chronic low back pain     Assessment: 74yo male c/o CP since 0330, improved w/ NTG x4, troponin elevated, to begin heparin.  Goal of Therapy:  Heparin level 0.3-0.7 units/ml Monitor platelets by anticoagulation protocol: Yes   Plan:  Will give heparin 3000 units IV bolus x1 followed by gtt at 900 units/hr and monitor heparin levels and CBC.  Wynona Neat, PharmD, BCPS  04/19/2014,6:17 AM

## 2014-04-19 NOTE — ED Notes (Signed)
Attempted report 

## 2014-04-19 NOTE — Progress Notes (Signed)
ANTICOAGULATION CONSULT NOTE - Follow Up Consult  Pharmacy Consult for heparin Indication: chest pain/ACS  Allergies  Allergen Reactions  . Brilinta [Ticagrelor]     Shortness of breath  . Shellfish Allergy     Causes gout flare-ups    Patient Measurements: Height: 5\' 5"  (165.1 cm) Weight: 160 lb (72.576 kg) IBW/kg (Calculated) : 61.5   Vital Signs: Temp: 98.1 F (36.7 C) (04/11 0945) Temp Source: Oral (04/11 0945) BP: 148/58 mmHg (04/11 1100) Pulse Rate: 61 (04/11 1100)  Labs:  Recent Labs  04/19/14 0531 04/19/14 1106 04/19/14 1651  HGB 11.3*  --   --   HCT 33.8*  --   --   PLT 203  --   --   LABPROT  --  14.2  --   INR  --  1.09  --   HEPARINUNFRC  --   --  0.37  CREATININE 5.68*  --   --   TROPONINI  --  0.04* 0.19*    Estimated Creatinine Clearance: 10.2 mL/min (by C-G formula based on Cr of 5.68).   Medical History: Past Medical History  Diagnosis Date  . Diabetes mellitus   . Hyperlipidemia   . Hypertension   . Gout   . Hypothyroidism     On supplementation  . LBBB (left bundle branch block)     Chronic  . Anemia     Likely secondary to history of GI bleed  . Stroke 1998  . CAD in native artery; and the grafts 1992, 1997, 2002, 2006, 2012    CABG 1992 and redo CABG 2006  . CAD (coronary artery disease) of bypass graft 2006, 2012    In 2006: Occluded SVG-OM noted (SVG-diagonal was previously occluded); 2012: Severe lesion in redo SVG-OM1 --> BMS PCI   . CAD S/P percutaneous coronary angioplasty October 2012; Feb, Apr & Oct 2015; Feb 2016    a) s/p CABG 1992 and redo 2006. b) 10/'12: NSTEMI: PCI to SVG-OM1 Integrity BMS 3.5 x 15 (3.85 mm) c) 2/'15: UA - PCI mid RCA Xience Ap DES 2.75 x 15 (3.1 mm). d) 4/'15 : UA - focal dRCA Resolute DES 2.25 x 8 (2.5 mm). d) 10/'15: PCI to mRCA ISR w/ post stent lesion - Promus P 2.75 x 16 (3.25 mm). e) 2/'16: NSTEMI: Cutting Balloon PTCA of  mRCA ISR (3.5 mm post-dilation).  . Ischemic cardiomyopathy     a)  Most recent EF 45-50% by echo 2015.  Marland Kitchen History of Non-ST elevated myocardial infarction (non-STEMI) 1992, 2006, 2012; 02/2013  . Peptic ulcer disease  2004  . BPH (benign prostatic hyperplasia)   . Chronic low back pain   . Carotid arterial disease     Duplex 05/2012: R BULB/PROX ICA: 50-69%, L BULB/PROX ICA: 0-49%.   . Colon cancer 2003    colectomy for CA. no recurrence . never required  radiation or chem  . End stage renal disease on dialysis     Dialyzes at [facility]: Tues/Th/Sat - left upper cavity AV fistula brachiocephalic    Assessment: [id number] male c/o CP since 0330, improved w/ NTG x4, troponin elevated, to begin heparin.  Coag: ACS, cath defered to 4/12.  Heparin level reported at goal. Will follow up with am labs.  No bleeding noted.   Goal of Therapy:  Heparin level 0.3-0.7 units/ml Monitor platelets by anticoagulation protocol: Yes   Plan:  Continue heparin at 900 units/hr  Thank you for allowing pharmacy to be a part of this  patients care team.  Rowe Robert Pharm.D., BCPS, AQ-Cardiology Clinical Pharmacist 04/19/2014 6:26 PM Pager: [phone] Phone: [phone]

## 2014-04-19 NOTE — Progress Notes (Signed)
Dayna Dunn PA notified of patient's episode of chest pain. Protocol followed, no new orders given at this time. afleming RN

## 2014-04-19 NOTE — Consult Note (Signed)
[facility] Renal Consultation Note    Indication for Consultation:  Management of ESRD/hemodialysis; anemia, hypertension/volume and secondary hyperparathyroidism  HPI: [name] [name] is a 74 y.o. male with ESRD (TTS [facility]) on HD since October 2012, CAD s/p CABG 1992, redo 2006, NSTEMI s/p mutiple stents, hx CVA 1998, colon cancer with bowel resection, NSTEMI/angina admission 02/2014, EF 45-50% 02/2014 admitted today with chest pain which has occurred more frequently since Thursday April 7th requiring up to NTG 10  Per day.  He denied SOB, but also stopped any physical alctivity. Sometimes he has post HD hypotension at home when getting up from a chair.  On dialysis days he stays in his recliner most of the day after dialysis and does well on non HD days.  However, this past weekend, he stayed in the recliner all weekend. Appetite has been poor when having chest pain. He had no SOB, diaphoresis, N, V prior to admission.  At present he is without CP on a NTG drip.   Past Medical History  Diagnosis Date  . Diabetes mellitus   . Hyperlipidemia   . Hypertension   . Gout   . Hypothyroidism     On supplementation  . LBBB (left bundle branch block)     Chronic  . History of colon cancer 2003    colectomy for CA. no recurrence . never required  radiation or chem  . Anemia     Likely secondary to history of GI bleed  . Stroke 1998  . End stage renal disease on dialysis     Dialyzes at [facility]: Tues/Th/Sat - left upper cavity AV fistula brachiocephalic  . CAD in native artery; and the grafts 1992, 1997, 2002, 2006, 2012    CABG 1992 and redo CABG 2006  . CAD (coronary artery disease) of bypass graft 2006, 2012    In 2006: Occluded SVG-OM noted (SVG-diagonal was previously occluded); 2012: Severe lesion in redo SVG-OM1 --> BMS PCI   . CAD S/P percutaneous coronary angioplasty October 2012; Feb, Apr & Oct 2015; Feb 2016    a) s/p CABG 1992 and redo 2006. b) 10/'12: NSTEMI: PCI  to SVG-OM1 Integrity BMS 3.5 x 15 (3.85 mm) c) 2/'15: UA - PCI mid RCA Xience Ap DES 2.75 x 15 (3.1 mm). d) 4/'15 : UA - focal dRCA Resolute DES 2.25 x 8 (2.5 mm). d) 10/'15: PCI to mRCA ISR w/ post stent lesion - Promus P 2.75 x 16 (3.25 mm). e) 2/'16: NSTEMI: Cutting Balloon PTCA of  mRCA ISR (3.5 mm post-dilation).  . Ischemic cardiomyopathy     a) Most recent EF 45-50% by echo 2015.  Marland Kitchen History of Non-ST elevated myocardial infarction (non-STEMI) 1992, 2006, 2012; 02/2013  . Peptic ulcer disease  2004  . BPH (benign prostatic hyperplasia)   . Chronic low back pain   . Carotid arterial disease     Duplex 05/2012: R BULB/PROX ICA: 50-69%, L BULB/PROX ICA: 0-49%.    Past Surgical History  Procedure Laterality Date  . Appendectomy    . Cholecystectomy  2009  . Eye surgery      bilateral cataracts   . Spinal fusion    . Av fistula repair Left     5/11  . Colon resection      transverse and proximal descending w/ primar anastomosis  . Doppler echocardiography  01/25/2012    EF 50 to 55%  . Nm myocar perf wall motion  10/12/2011    EF 54%,LV  normal ; no signifiant ischemia  . Cardiac catheterization  10/16/2010    patent  LIMA to the LAD , PATENT  svg TO 2nd marginals ,occluded PLA with collaterals and SVG to the OM  had 90% stenosis  was txlge  bare - metal  3.5  Integrity ten  postdilate  36-37  mm    . Cardiac catheterization  03/22/2004    loss of both SVG,severe disease in prox and ostial  circ not amenable to invention. mod diease distal LAD  AFTER BYPASS GRAFT;-CVTS to evaluate   . Coronary angioplasty  04/03/1995    OM and Circ  . Coronary angioplasty  02/01/2000    [facility]  . Carotid doppler  05/23/2012    ABN CAROTID-- RGT BULB/PROX ICA mild to mod 50-60%;lft bulb/prox mild to mod 0-49%;left subclavian abn waveforms consistent with patients lft arm A/V fistula  . Event monitor  01/23/2012-02/06/2012    SINUS ,LBBB,unifocal PVCs  . Coronary artery bypass graft  08/22/1990     INITIAL:LIMA to LAD, SVG to  Diagonal, SVG to OM  . Coronary artery bypass graft  2006    SVG to OM1,SVG to OM2 with patent LIMA  to LAD and occluded vein  graft to the diagonal  and occluded vein grft to OM from  prior  surgery  . Percutaneous coronary stent intervention (pci-s)  02/23/2013    mid RCA 80% & 70% - Xience 2.75 mm x 15 mm ( 3.72mm) ; LIMA-LAD patent, SVG-OM patent (stent patent); SVG-Diag patent.  . Transthoracic echocardiogram  02/23/2013    Moderate concentric hypertrophy. EF 4500%. Septal bounce. Grade 2 diastolic dysfunction (pseudo-normal - severely elevated filling pressures) mild to moderate MR and moderate LA dilation to  . Percutaneous coronary stent intervention (pci-s)  April 2015    dRCA - Integrity Resolute DES   2.25 mm x 2mm (2.5 mm)  . Percutaneous coronary stent intervention (pci-s)  Oct 15 2013    [place] Angina: mRCA ISR with post-stent stenosis -- Cuting PTCA & PCI Promus Premier DES 2.75 mm x 16 mm (3.25 mm)  . Lower extremity arterial dopplers  October 2014    Calcified but non-occlusive peripheral arteries. No evidence of stenosis  . Left heart catheterization with coronary angiogram N/A 02/23/2013    Procedure: LEFT HEART CATHETERIZATION WITH CORONARY ANGIOGRAM;  Surgeon: Lorretta Harp, MD;  Location: [facility] CATH LAB;  Service: Cardiovascular;  Laterality: N/A;  . Left heart catheterization with coronary/graft angiogram N/A 04/30/2013    Procedure: LEFT HEART CATHETERIZATION WITH Beatrix Fetters;  Surgeon: Leonie Man, MD;  Location: [facility] CATH LAB;  Service: Cardiovascular;  Laterality: N/A;  . Left heart catheterization with coronary/graft angiogram N/A 10/15/2013    Procedure: LEFT HEART CATHETERIZATION WITH Beatrix Fetters;  Surgeon: Leonie Man, MD;  Location: [facility] CATH LAB;  Service: Cardiovascular;  Laterality: N/A;  . Colon surgery    . Left heart catheterization with coronary/graft angiogram N/A 02/09/2014    Procedure: LEFT HEART  CATHETERIZATION WITH Beatrix Fetters;  Surgeon: Leonie Man, MD;  Location: [facility] CATH LAB;  Service: Cardiovascular;  Laterality: N/A;  . Coronary angioplasty  02/09/14    Cutting Balloon PTCA of mRCA ISR 99% - 3.5 mm   Family History  Problem Relation Age of Onset  . Heart disease Mother   . Hypertension Mother   . Diabetes Mother   . Heart disease Father   . Hypertension Father   . Diabetes Father    Social History:  reports that he has never smoked. He has never used smokeless tobacco. He reports that he does not drink alcohol or use illicit drugs. Allergies  Allergen Reactions  . Brilinta [Ticagrelor]     Shortness of breath  . Shellfish Allergy     Causes gout flare-ups   Prior to Admission medications   Medication Sig Start Date End Date Taking? Authorizing Provider  aspirin 81 MG chewable tablet Chew 81 mg by mouth at bedtime.   Yes Historical Provider, MD  atorvastatin (LIPITOR) 40 MG tablet Take 40 mg by mouth at bedtime.   Yes Historical Provider, MD  Besifloxacin HCl (BESIVANCE) 0.6 % SUSP Place 1 drop into both eyes See admin instructions. Use eye drops 4 times daily for 2 days following injection by Dr. Zigmund Daniel (once a month)   Yes Historical Provider, MD  carvedilol (COREG) 25 MG tablet Take 1 tablet (25 mg total) by mouth 2 (two) times daily with a meal. 03/15/14  Yes Midge Minium, MD  clopidogrel (PLAVIX) 75 MG tablet Take 1 tablet (75 mg total) by mouth daily. 03/10/14  Yes Leonie Man, MD  insulin NPH Human (HUMULIN N,NOVOLIN N) 100 UNIT/ML injection Inject 10-20 Units into the skin 2 (two) times daily. Take 10 units if blood sugar is 130-150.  Take 20 units if blood sugar is >150.   Yes Historical Provider, MD  isosorbide mononitrate (IMDUR) 60 MG 24 hr tablet Take 60 mg by mouth 2 (two) times daily. 07/15/13  Yes Midge Minium, MD  levothyroxine (SYNTHROID, LEVOTHROID) 125 MCG tablet Take 1 tablet (125 mcg total) by mouth daily before breakfast.  11/06/13  Yes Midge Minium, MD  loperamide (IMODIUM) 2 MG capsule Take 2 mg by mouth daily as needed for diarrhea or loose stools.    Yes Historical Provider, MD  nitroGLYCERIN (NITROSTAT) 0.4 MG SL tablet Place 1 tablet (0.4 mg total) under the tongue every 5 (five) minutes as needed for chest pain. 11/18/13  Yes Midge Minium, MD  sevelamer carbonate (RENVELA) 800 MG tablet Take 3(three)  800mg  tablets by mouth with every meal.   Yes Historical Provider, MD  glucose blood (PRODIGY NO CODING BLOOD GLUC) test strip Test as directed 12/17/11   Midge Minium, MD  multivitamin (RENA-VIT) TABS tablet Take 1 tablet by mouth daily. Patient not taking: Reported on 04/19/2014 12/17/11   Midge Minium, MD  terazosin (HYTRIN) 2 MG capsule Take 1 capsule (2 mg total) by mouth at bedtime. Patient not taking: Reported on 04/19/2014 11/06/13   Midge Minium, MD   Current Facility-Administered Medications  Medication Dose Route Frequency Provider Last Rate Last Dose  . 0.9 %  sodium chloride infusion  250 mL Intravenous PRN Dayna N Dunn, PA-C      . 0.9 %  sodium chloride infusion  250 mL Intravenous PRN Dayna N Dunn, PA-C      . acetaminophen (TYLENOL) tablet 650 mg  650 mg Oral Q4H PRN Dayna N Dunn, PA-C      . [START ON 04/20/2014] aspirin chewable tablet 81 mg  81 mg Oral Daily Dayna N Dunn, PA-C      . aspirin chewable tablet 81 mg  81 mg Oral Pre-Cath Josue Hector, MD      . atorvastatin (LIPITOR) tablet 40 mg  40 mg Oral QHS Dayna N Dunn, PA-C      . carvedilol (COREG) tablet 25 mg  25 mg Oral BID WC Dayna N Dunn, PA-C      .  clopidogrel (PLAVIX) tablet 75 mg  75 mg Oral BID Dayna N Dunn, PA-C      . heparin ADULT infusion 100 units/mL (25000 units/250 mL)  900 Units/hr Intravenous Continuous Laren Everts, RPH 9 mL/hr at 04/19/14 0658 900 Units/hr at 04/19/14 0658  . insulin aspart (novoLOG) injection 0-9 Units  0-9 Units Subcutaneous TID WC Dayna N Dunn, PA-C      . [START ON  04/20/2014] levothyroxine (SYNTHROID, LEVOTHROID) tablet 125 mcg  125 mcg Oral QAC breakfast Dayna N Dunn, PA-C      . nitroGLYCERIN (NITROSTAT) SL tablet 0.4 mg  0.4 mg Sublingual Q5 min PRN Dayna N Dunn, PA-C      . nitroGLYCERIN 50 mg in dextrose 5 % 250 mL (0.2 mg/mL) infusion  10-200 mcg/min Intravenous Titrated Delora Fuel, MD 3 mL/hr at [id number] 10 mcg/min at 04/19/14 0631  . ondansetron (ZOFRAN) injection 4 mg  4 mg Intravenous Q6H PRN Dayna N Dunn, PA-C      . sevelamer carbonate (RENVELA) tablet 2,400 mg  2,400 mg Oral TID WC Dayna N Dunn, PA-C      . sodium chloride 0.9 % injection 3 mL  3 mL Intravenous Q12H Dayna N Dunn, PA-C   3 mL at 04/19/14 0942  . sodium chloride 0.9 % injection 3 mL  3 mL Intravenous PRN Dayna N Dunn, PA-C      . sodium chloride 0.9 % injection 3 mL  3 mL Intravenous Q12H Dayna N Dunn, PA-C   3 mL at 04/19/14 0942  . sodium chloride 0.9 % injection 3 mL  3 mL Intravenous PRN Charlie Pitter, PA-C       Labs: Basic Metabolic Panel:  Recent Labs Lab 04/19/14 0531  NA 137  K 3.8  CL 95*  CO2 30  GLUCOSE 150*  BUN 52*  CREATININE 5.68*  CALCIUM 9.7   CBC:  Recent Labs Lab 04/19/14 0531  WBC 8.8  HGB 11.3*  HCT 33.8*  MCV 93.4  PLT 203   Studies/Results: Dg Chest Port 1 View  04/19/2014   CLINICAL DATA:  Mid chest pain this morning. History of coronary artery stenting. History of diabetes, hypertension.  EXAM: PORTABLE CHEST - 1 VIEW  COMPARISON:  Chest radiograph February 08, 2014  FINDINGS: The cardiac silhouette is mildly enlarged, status post median sternotomy for CABG. No pleural effusion or focal consolidation. No pneumothorax. LEFT subclavian vascular stent is new from prior chest radiograph. Osseous structure nonsuspicious.  IMPRESSION: Mild cardiomegaly, no acute pulmonary process.  New LEFT subclavian stent.   Electronically Signed   By: Elon Alas   On: 04/19/2014 06:15    ROS: As per HPI otherwise negative.  Physical  Exam: Filed Vitals:   04/19/14 0709 04/19/14 0800 04/19/14 0900 04/19/14 0945  BP: 135/50 124/49 127/46 148/45  Pulse: 58 62 60 62  Temp:    98.1 F (36.7 C)  TempSrc:    Oral  Resp: 10 14 10    Height:      Weight:      SpO2: 99% 97% 96% 100%     General: Well developed, well nourished, in no acute distress. Supine in bed Head: Normocephalic, atraumatic, sclera non-icteric, mucus membranes are moist Neck: Supple. JVD not elevated. Lungs: Clear bilaterally to auscultation without wheezes, rales, or rhonchi. Breathing is unlabored. Heart: RRR with S1 S2. No murmurs, rubs, or gallops appreciated. Abdomen: Soft, non-tender, non-distended with normoactive bowel sounds. No rebound/guarding.  Lower extremities:without edema or ischemic  changes, no open wounds  Neuro: Alert and oriented X 3. Moves all extremities spontaneously. Psych:  Responds to questions appropriately with a normal affect. Dialysis Access:left upper AVF + bruit   Dialysis Orders:  Center: [facility] 72 4hr 160 2K 2.25 Ca profile 4 left upper AVF 350/800 ARanesp 25 q 2 weeks - last 3/31 Hectorol 1 heparin 4000 venofer 50 per week  OLC-vol 34.2 Recent labs:  Hgb 10.8 36% sat iPTH 248 HGB A1c 6 K 3.8   Assessment/Plan: 1. Chest pain worsening in frequency over the past 5 days with hx of unstable angina/CAD s/p CABG redo with multiple intervention-  For heart cath today 2. ESRD -  TTS - HD tomorrow 3. Hypertension/volume  - CXR neg for volume/carvedilol 25 bid - gets to edw consistently 4. Anemia  - Hgb 11.3 next ESA due 4/14 + weekly Fe 5. Metabolic bone disease -  Continue Hectorol, renvela 3 ac 6. Nutrition - NPO - for heart cath today 7. DM/hyperlipidemia/gout - per cards; Hgb A1c 6 02/2014 8. Hypothyroidism - on synthroid  Myriam Jacobson, PA-C [place] [phone] 04/19/2014, 10:00 AM

## 2014-04-20 ENCOUNTER — Encounter (HOSPITAL_COMMUNITY): Payer: Self-pay | Admitting: Cardiovascular Disease

## 2014-04-20 ENCOUNTER — Encounter (HOSPITAL_COMMUNITY)
Admission: EM | Disposition: A | Discharge status: Home / Self Care | Payer: Self-pay | Source: Home / Self Care | Attending: Cardiovascular Disease

## 2014-04-20 DIAGNOSIS — I25110 Atherosclerotic heart disease of native coronary artery with unstable angina pectoris: Secondary | ICD-10-CM

## 2014-04-20 DIAGNOSIS — E039 Hypothyroidism, unspecified: Secondary | ICD-10-CM

## 2014-04-20 DIAGNOSIS — I10 Essential (primary) hypertension: Secondary | ICD-10-CM

## 2014-04-20 HISTORY — PX: LEFT AND RIGHT HEART CATHETERIZATION WITH CORONARY/GRAFT ANGIOGRAM: SHX5448

## 2014-04-20 LAB — CBC
HCT: 30.1 % — ABNORMAL LOW (ref 39.0–52.0)
HCT: 29 % — ABNORMAL LOW (ref 39.0–52.0)
HEMOGLOBIN: 10.5 g/dL — AB (ref 13.0–17.0)
Hemoglobin: 10.3 g/dL — ABNORMAL LOW (ref 13.0–17.0)
MCH: 32 pg (ref 26.0–34.0)
MCH: 32.6 pg (ref 26.0–34.0)
MCHC: 34.9 g/dL (ref 30.0–36.0)
MCHC: 35.5 g/dL (ref 30.0–36.0)
MCV: 91.8 fL (ref 78.0–100.0)
MCV: 91.8 fL (ref 78.0–100.0)
Platelets: 202 10*3/uL (ref 150–400)
Platelets: 202 10*3/uL (ref 150–400)
RBC: 3.28 MIL/uL — ABNORMAL LOW (ref 4.22–5.81)
RBC: 3.16 MIL/uL — ABNORMAL LOW (ref 4.22–5.81)
RDW: 15.2 % (ref 11.5–15.5)
RDW: 15.4 % (ref 11.5–15.5)
WBC: 8.9 10*3/uL (ref 4.0–10.5)
WBC: 7 10*3/uL (ref 4.0–10.5)

## 2014-04-20 LAB — GLUCOSE, CAPILLARY
GLUCOSE-CAPILLARY: 140 mg/dL — AB (ref 70–99)
GLUCOSE-CAPILLARY: 150 mg/dL — AB (ref 70–99)
Glucose-Capillary: 162 mg/dL — ABNORMAL HIGH (ref 70–99)
Glucose-Capillary: 158 mg/dL — ABNORMAL HIGH (ref 70–99)

## 2014-04-20 LAB — LIPID PANEL
Cholesterol: 68 mg/dL (ref 0–200)
HDL: 22 mg/dL — AB (ref >39)
LDL Cholesterol: NEGATIVE mg/dL (ref 0–99)
Total CHOL/HDL Ratio: 3.1 RATIO
Triglycerides: 241 mg/dL — ABNORMAL HIGH (ref <150)
VLDL: 48 mg/dL — ABNORMAL HIGH (ref 0–40)

## 2014-04-20 LAB — RENAL FUNCTION PANEL
Albumin: 3.5 g/dL (ref 3.5–5.2)
Anion gap: 17 — ABNORMAL HIGH (ref 5–15)
BUN: 56 mg/dL — ABNORMAL HIGH (ref 6–23)
CO2: 23 mmol/L (ref 19–32)
Calcium: 9 mg/dL (ref 8.4–10.5)
Chloride: 93 mmol/L — ABNORMAL LOW (ref 96–112)
Creatinine, Ser: 5.96 mg/dL — ABNORMAL HIGH (ref 0.50–1.35)
GFR calc Af Amer: 10 mL/min — ABNORMAL LOW (ref >90)
GFR calc non Af Amer: 8 mL/min — ABNORMAL LOW (ref >90)
Glucose, Bld: 194 mg/dL — ABNORMAL HIGH (ref 70–99)
Phosphorus: 4.6 mg/dL (ref 2.3–4.6)
Potassium: 3.4 mmol/L — ABNORMAL LOW (ref 3.5–5.1)
Sodium: 133 mmol/L — ABNORMAL LOW (ref 135–145)

## 2014-04-20 LAB — POCT ACTIVATED CLOTTING TIME: Activated Clotting Time: 374 seconds

## 2014-04-20 LAB — BASIC METABOLIC PANEL
ANION GAP: 13 (ref 5–15)
BUN: 63 mg/dL — ABNORMAL HIGH (ref 6–23)
CO2: 29 mmol/L (ref 19–32)
Calcium: 9.4 mg/dL (ref 8.4–10.5)
Chloride: 94 mmol/L — ABNORMAL LOW (ref 96–112)
Creatinine, Ser: 6 mg/dL — ABNORMAL HIGH (ref 0.50–1.35)
GFR calc Af Amer: 10 mL/min — ABNORMAL LOW (ref >90)
GFR calc non Af Amer: 8 mL/min — ABNORMAL LOW (ref >90)
GLUCOSE: 143 mg/dL — AB (ref 70–99)
POTASSIUM: 3.4 mmol/L — AB (ref 3.5–5.1)
SODIUM: 136 mmol/L (ref 135–145)

## 2014-04-20 LAB — HEPARIN LEVEL (UNFRACTIONATED): Heparin Unfractionated: 0.19 IU/mL — ABNORMAL LOW (ref 0.30–0.70)

## 2014-04-20 SURGERY — LEFT AND RIGHT HEART CATHETERIZATION WITH CORONARY/GRAFT ANGIOGRAM
Anesthesia: LOCAL

## 2014-04-20 MED ORDER — OXYCODONE-ACETAMINOPHEN 5-325 MG PO TABS: 1.0000 | ORAL_TABLET | ORAL | Status: DC | PRN

## 2014-04-20 MED ORDER — MIDAZOLAM HCL 2 MG/2ML IJ SOLN
INTRAMUSCULAR | Status: AC
  Filled 2014-04-20: qty 2

## 2014-04-20 MED ORDER — SODIUM CHLORIDE 0.9 % IV SOLN: 250.0000 mL | INTRAVENOUS | Status: DC | PRN

## 2014-04-20 MED ORDER — SODIUM CHLORIDE 0.9 % IV SOLN
INTRAVENOUS | Status: DC
  Administered 2014-04-20: 10 mL/h via INTRAVENOUS

## 2014-04-20 MED ORDER — DOXERCALCIFEROL 4 MCG/2ML IV SOLN
INTRAVENOUS | Status: AC
  Filled 2014-04-20: qty 2

## 2014-04-20 MED ORDER — HEPARIN BOLUS VIA INFUSION
2000.0000 [IU] | Freq: Once | INTRAVENOUS | Status: AC
  Administered 2014-04-20: 2000 [IU] via INTRAVENOUS
  Filled 2014-04-20: qty 2000

## 2014-04-20 MED ORDER — SODIUM CHLORIDE 0.9 % IJ SOLN: 3.0000 mL | Freq: Two times a day (BID) | INTRAMUSCULAR | Status: DC

## 2014-04-20 MED ORDER — HEPARIN (PORCINE) IN NACL 2-0.9 UNIT/ML-% IJ SOLN
INTRAMUSCULAR | Status: AC
  Filled 2014-04-20: qty 1000

## 2014-04-20 MED ORDER — LIVING WELL WITH DIABETES BOOK
Freq: Once | Status: AC
  Administered 2014-04-20
  Filled 2014-04-20: qty 1

## 2014-04-20 MED ORDER — CILOSTAZOL 50 MG PO TABS
50.0000 mg | ORAL_TABLET | Freq: Two times a day (BID) | ORAL | Status: DC
  Administered 2014-04-20 – 2014-04-21 (×3): 50 mg via ORAL
  Filled 2014-04-20 (×5): qty 1

## 2014-04-20 MED ORDER — ANGIOPLASTY BOOK
Freq: Once | Status: AC
  Administered 2014-04-20: 23:00:00
  Filled 2014-04-20: qty 1

## 2014-04-20 MED ORDER — ASPIRIN 81 MG PO CHEW: 81.0000 mg | CHEWABLE_TABLET | ORAL | Status: DC

## 2014-04-20 MED ORDER — BIVALIRUDIN 250 MG IV SOLR
INTRAVENOUS | Status: AC
  Filled 2014-04-20: qty 250

## 2014-04-20 MED ORDER — FENTANYL CITRATE 0.05 MG/ML IJ SOLN
INTRAMUSCULAR | Status: AC
  Filled 2014-04-20: qty 2

## 2014-04-20 MED ORDER — SODIUM CHLORIDE 0.9 % IJ SOLN: 3.0000 mL | INTRAMUSCULAR | Status: DC | PRN

## 2014-04-20 MED ORDER — LIDOCAINE HCL (PF) 1 % IJ SOLN
INTRAMUSCULAR | Status: AC
  Filled 2014-04-20: qty 30

## 2014-04-20 NOTE — Progress Notes (Signed)
ANTICOAGULATION CONSULT NOTE - Follow Up Consult  Pharmacy Consult for heparin Indication: chest pain/ACS  Allergies  Allergen Reactions  . Brilinta [Ticagrelor]     Shortness of breath  . Shellfish Allergy     Causes gout flare-ups    Patient Measurements: Height: 5\' 5"  (165.1 cm) Weight: 161 lb 11.2 oz (73.347 kg) IBW/kg (Calculated) : 61.5  Heparin dosing wt 7. kg   Vital Signs: Temp: 97.7 F (36.5 C) (04/12 0508) Temp Source: Oral (04/12 0508) BP: 152/53 mmHg (04/12 0508) Pulse Rate: 55 (04/12 0508)  Labs:  Recent Labs  04/19/14 0531 04/19/14 1106 04/19/14 1651 04/19/14 2115 04/20/14 0425  HGB 11.3*  --   --   --  10.5*  HCT 33.8*  --   --   --  30.1*  PLT 203  --   --   --  202  LABPROT  --  14.2  --   --   --   INR  --  1.09  --   --   --   HEPARINUNFRC  --   --  0.37  --  0.19*  CREATININE 5.68*  --   --   --  6.00*  TROPONINI  --  0.04* 0.19* 0.33*  --     Estimated Creatinine Clearance: 9.7 mL/min (by C-G formula based on Cr of 6).   Medical History: Past Medical History  Diagnosis Date  . Diabetes mellitus   . Hyperlipidemia   . Hypertension   . Gout   . Hypothyroidism     On supplementation  . LBBB (left bundle branch block)     Chronic  . Anemia     Likely secondary to history of GI bleed  . Stroke 1998  . CAD in native artery; and the grafts 1992, 1997, 2002, 2006, 2012    CABG 1992 and redo CABG 2006  . CAD (coronary artery disease) of bypass graft 2006, 2012    In 2006: Occluded SVG-OM noted (SVG-diagonal was previously occluded); 2012: Severe lesion in redo SVG-OM1 --> BMS PCI   . CAD S/P percutaneous coronary angioplasty October 2012; Feb, Apr & Oct 2015; Feb 2016    a) s/p CABG 1992 and redo 2006. b) 10/'12: NSTEMI: PCI to SVG-OM1 Integrity BMS 3.5 x 15 (3.85 mm) c) 2/'15: UA - PCI mid RCA Xience Ap DES 2.75 x 15 (3.1 mm). d) 4/'15 : UA - focal dRCA Resolute DES 2.25 x 8 (2.5 mm). d) 10/'15: PCI to mRCA ISR w/ post stent lesion -  Promus P 2.75 x 16 (3.25 mm). e) 2/'16: NSTEMI: Cutting Balloon PTCA of  mRCA ISR (3.5 mm post-dilation).  . Ischemic cardiomyopathy     a) Most recent EF 45-50% by echo 2015.  Marland Kitchen History of Non-ST elevated myocardial infarction (non-STEMI) 1992, 2006, 2012; 02/2013  . Peptic ulcer disease  2004  . BPH (benign prostatic hyperplasia)   . Chronic low back pain   . Carotid arterial disease     Duplex 05/2012: R BULB/PROX ICA: 50-69%, L BULB/PROX ICA: 0-49%.   . Colon cancer 2003    colectomy for CA. no recurrence . never required  radiation or chem  . End stage renal disease on dialysis     Dialyzes at [facility]: Tues/Th/Sat - left upper cavity AV fistula brachiocephalic    Assessment: [id number] male c/o CP since 0330, improved w/ NTG x4, troponin elevated, to begin heparin.  Coag: ACS, cath defered to 4/12.  Heparin level = 0.19,  decreased below the goal.  No bleeding noted. No issues with heparin infusion per RN.    Goal of Therapy:  Heparin level 0.3-0.7 units/ml Monitor platelets by anticoagulation protocol: Yes   Plan:  Bolus 2000 units IVx1 Increase heparin to 1100 units/hr F/u 8hr heparin level or f/u after cath.   Thank you for allowing pharmacy to be a part of this patients care team.  Nicole Cella, RPh Clinical Pharmacist Pager: [phone] 04/20/2014 6:18 AM

## 2014-04-20 NOTE — Interval H&P Note (Signed)
History and Physical Interval Note:  04/20/2014 7:41 AM  [name] [name]  has presented today for surgery, with the diagnosis of Chest Pain  The various methods of treatment have been discussed with the patient and family. After consideration of risks, benefits and other options for treatment, the patient has consented to  Procedure(s): LEFT AND RIGHT HEART CATHETERIZATION WITH CORONARY/GRAFT ANGIOGRAM (N/A) as a surgical intervention .  The patient's history has been reviewed, patient examined, no change in status, stable for surgery.  I have reviewed the patient's chart and labs.  Questions were answered to the patient's satisfaction.    Cath Lab Visit (complete for each Cath Lab visit)  Clinical Evaluation Leading to the Procedure:   ACS: Yes.    Non-ACS:    Anginal Classification: CCS IV  Anti-ischemic medical therapy: Maximal Therapy (2 or more classes of medications)  Non-Invasive Test Results: No non-invasive testing performed  Prior CABG: Previous CABG       [name] [name]

## 2014-04-20 NOTE — CV Procedure (Signed)
Cardiac Catheterization Procedure Note  Name: [name] [name] MRN: [id number] DOB: Aug 08, 1940  Procedure: Left Heart Cath, Selective Coronary Angiography, LV angiography,  PTCA/Angiosculpt balloon dilatation of the native RCA  Indication: Non-ST elevation MI. 74 year old gentleman with end-stage renal disease. He has undergone multiple PCI procedures. He's had 2 previous CABG revascularization surgeries. He has had recurrent ACS presentations related to severe in-stent restenosis in the native right coronary artery. He presents now with his typical symptoms of resting angina. Troponins are elevated. He is referred for cardiac catheterization and possible PCI.  Diagnostic Procedure Details: The right groin was prepped, draped, and anesthetized with 1% lidocaine. Using the modified Seldinger technique, a 5 French sheath was introduced into the right femoral artery. Standard Judkins catheters were used for selective coronary angiography and left ventriculography. Catheter exchanges were performed over a wire.  The diagnostic procedure was well-tolerated without immediate complications.  PROCEDURAL FINDINGS Hemodynamics: AO 136/36 mean 68 LV 134/4  Coronary angiography: Coronary dominance: right  Left mainstem: The left main is severely diseased. There is heavy calcification and diffuse 80% stenosis.  Left anterior descending (LAD): The LAD is critically diseased. There is heavy calcification and 100% proximal vessel occlusion. The first septal perforator does still antegrade with severe inflow disease noted.  Left circumflex (LCx): The circumflex is severely diseased. The vessel is totally occluded proximally. There is heavy calcification present.  Right coronary artery (RCA): The RCA is dominant. The vessel is heavily calcified and it has stented throughout. The stented segment in the mid RCA where there are overlapping stents has focal 95% in-stent restenosis. The distal vessel has  diffuse irregularity with patency of the distal stent. The PDA branches pain.  Vein graft to OM1 is patent. The diagonal is diffusely diseased. The AV circumflex fills retrograde into a second small posterolateral branch. There is diffuse distal vessel disease but no significant stenosis throughout the vein graft or the graft insertion site.  Saphenous vein graft to the ramus intermedius is patent. The graft has no significant obstruction. Intermediate branch divides into twin vessels. There is a collateral filling the right posterolateral branch.  LIMA to LAD: The vessel is widely patent. This retrograde fills the first diagonal. The LAD is diffusely diseased without high-grade focal stenosis. The mid and distal LAD are relatively small in caliber, estimated at 2.0 mm or less.  Left ventriculography: The mid and distal inferior wall is hypokinetic. The anterolateral and apical portions of the LV contract normally. The basal inferior wall contracts normally. The estimated LVEF is 50%.  PCI Procedure Note:  Following the diagnostic procedure, the decision was made to proceed with PCI. The sheath was upsized to a 6 [place]. The patient had tight focal in-stent restenosis in an area where there are at least 2 layers of overlapping stent. I did not feel there were any treatment options other than balloon angioplasty alone. He has been on long-term dual antiplatelet therapy with aspirin and Plavix. Weight-based bivalirudin was given for anticoagulation. Once a therapeutic ACT was achieved, a 6 [place] AL-1 guide catheter was inserted.  A cougar coronary guidewire was used to cross the lesion.  A Guideliner was advanced over a 2.5 mm balloon into the mid-vessel. This was used because of previous cases requiring a guideliner to navigate the proximal/mid-vessel. The lesion was predilated with a 2.5 mm balloon.  The lesion was then dilated with a 3.0 mm Angiosculpt balloon to 18 atm for 2 inflations of 60 seconds.   Following PCI,  there was 10% residual stenosis and TIMI-3 flow. Final angiography confirmed an excellent result. Femoral hemostasis was achieved with a Perclose device.  The patient tolerated the PCI procedure well. There were no immediate procedural complications.  The patient was transferred to the post catheterization recovery area for further monitoring.  PCI Data: Vessel - RCA/Segment - mid (in-stent) Percent Stenosis (pre)  95 TIMI-flow 3 Stent none Percent Stenosis (post) 10 TIMI-flow (post) 3  Estimated Blood Loss: minimal  Final Conclusions:   1. Severe three-vessel native CAD 2. Status post aortocoronary bypass surgery with continued patency of the LIMA to LAD, vein graft obtuse marginal, and vein graft to intermediate 3. Severe recurrent in-stent restenosis of the mid right coronary artery, treated successfully with scoring balloon angioplasty 4. Mild segmental contraction abnormality of the LV with an LVEF estimated at 50%.  Recommendations: Continue dual antiplatelet therapy with aspirin and Plavix. Plavix has been increased to 75 mg twice daily because of elevated PRU suggesting hyporesponsiveness. Will add Pletal which has been shown in small series to reduce ISR. Explore options of brachytherapy redo CABG (would be third surgery) for recurrence.  Sherren Mocha MD, [facility] 04/20/2014, 9:09 AM

## 2014-04-20 NOTE — Progress Notes (Addendum)
MD Balfour notified of pt's troponin 0.33. Denies CP, remains on ntg gtt and heparin. Will continue to monitor.

## 2014-04-20 NOTE — Progress Notes (Addendum)
Patient up to BR and c/o mild chest tightness 2/10, resolved upon returning to bed. Event lasted < 1 minute. Currently denies cp, sob. Will continue to monitor.

## 2014-04-20 NOTE — Progress Notes (Signed)
PROGRESS NOTE  Subjective:   [name] [name] is a 74 y/o M with extensive history of CAD (s/p CABG 1992/redo 2006), ICM, ESRD on HD, HTN, HLD, DM, chronic LBBB, PUD, stroke who presents to [facility] with chest pain reminiscent of prior angina. He had CABG 1992 and redo in 2006 with extensive PCIs as listed below (PMH). He was most recently admitted 02/2014 with crescendo angina and NSTEMI. And underwent successful difficult PTCA to severe mid RCA ISR, expanded to 3.46mm. He has a known occluded native LCA with widely patent LIMA-LAD, SVG-OM1 and SVG-OM2. Antiplatelet was changed to Brilinta. He was seen by Dr. Ellyn Hack in followup on 02/12/14 who stated "the most significant episodes of angina he's been having over the last year having involved his RCA lesions. The last 2 interventions have been roughly the same spot. We could consider the possibility of breaking therapy in the future [sic: brachiotherapy]. I am concerned however that he may eventually shut down his RCA. I don't think he would be a candidate for a third redo CABG." The patient called in last month requesting to change back to Plavix due to SOB and cost with Brilinta. He is not able to be on Effient due to history of stroke. He does feel like going back to Plavix improved his breathlessness.  He had a cath this am .  He was found to have significant disease in the mid RCA .  He had cutting balloon to the mid RCA   Objective:    Vital Signs:   Temp:  [97.6 F (36.4 C)-98.1 F (36.7 C)] 97.6 F (36.4 C) (04/12 0924) Pulse Rate:  [54-68] 66 (04/12 0737) Resp:  [17-18] 17 (04/12 0508) BP: (112-162)/(43-70) 152/53 mmHg (04/12 0508) SpO2:  [97 %-100 %] 99 % (04/12 0508) Weight:  [161 lb 11.2 oz (73.347 kg)] 161 lb 11.2 oz (73.347 kg) (04/12 0508)  Last BM Date: 04/18/14   24-hour weight change: Weight change: 1 lb 11.2 oz (0.771 kg)  Weight trends: Filed Weights   04/19/14 0519 04/20/14 0508  Weight: 160 lb (72.576  kg) 161 lb 11.2 oz (73.347 kg)    Intake/Output:  04/11 0701 - 04/12 0700 In: 120 [P.O.:120] Out: -      Physical Exam: BP 152/53 mmHg  Pulse 66  Temp(Src) 97.6 F (36.4 C) (Oral)  Resp 17  Ht 5\' 5"  (1.651 m)  Wt 161 lb 11.2 oz (73.347 kg)  BMI 26.91 kg/m2  SpO2 99%  Wt Readings from Last 3 Encounters:  04/20/14 161 lb 11.2 oz (73.347 kg)  03/19/14 162 lb 8 oz (73.71 kg)  02/12/14 165 lb 3.2 oz (74.934 kg)    General: Vital signs reviewed and noted.   Head: Normocephalic, atraumatic.  Eyes: conjunctivae/corneas clear.  EOM's intact.   Throat: normal  Neck:  normal   Lungs:    clear   Heart:  RR   Abdomen:  Soft, non-tender, non-distended    Extremities: Bandage right groin   Neurologic: A&O X3, CN II - XII are grossly intact.   Psych: Normal     Labs: BMET:  Recent Labs  04/19/14 0531 04/20/14 0425  NA 137 136  K 3.8 3.4*  CL 95* 94*  CO2 30 29  GLUCOSE 150* 143*  BUN 52* 63*  CREATININE 5.68* 6.00*  CALCIUM 9.7 9.4    Liver function tests:  Recent Labs  04/19/14 1106  AST 26  ALT 20  ALKPHOS 73  BILITOT  0.8  PROT 6.5  ALBUMIN 3.9   No results for input(s): LIPASE, AMYLASE in the last 72 hours.  CBC:  Recent Labs  04/19/14 0531 04/20/14 0425  WBC 8.8 8.9  HGB 11.3* 10.5*  HCT 33.8* 30.1*  MCV 93.4 91.8  PLT 203 202    Cardiac Enzymes:  Recent Labs  04/19/14 1106 04/19/14 1651 04/19/14 2115  TROPONINI 0.04* 0.19* 0.33*    Coagulation Studies:  Recent Labs  04/19/14 1106  LABPROT 14.2  INR 1.09    Other: Invalid input(s): POCBNP No results for input(s): DDIMER in the last 72 hours. No results for input(s): HGBA1C in the last 72 hours.  Recent Labs  04/20/14 0425  CHOL 68  HDL 22*  LDLCALC NEG 2  TRIG 241*  CHOLHDL 3.1    Recent Labs  04/19/14 1106  TSH 0.164*   No results for input(s): VITAMINB12, FOLATE, FERRITIN, TIBC, IRON, RETICCTPCT in the last 72 hours.   Other results:  Tele   (  personally reviewed )  , sinus brady, LBBB    Medications:    Infusions: . nitroGLYCERIN 30 mcg/min (04/19/14 1050)    Scheduled Medications: . aspirin  81 mg Oral Daily  . atorvastatin  40 mg Oral QHS  . carvedilol  25 mg Oral BID WC  . cilostazol  50 mg Oral BID  . clopidogrel  75 mg Oral BID  . [START ON 04/22/2014] darbepoetin (ARANESP) injection - DIALYSIS  25 mcg Intravenous Q Thu-HD  . doxercalciferol  1 mcg Intravenous Q T,Th,Sa-HD  . [START ON 04/22/2014] ferric gluconate (FERRLECIT/NULECIT) IV  62.5 mg Intravenous Q Thu-HD  . insulin aspart  0-9 Units Subcutaneous TID WC  . levothyroxine  125 mcg Oral QAC breakfast  . multivitamin  1 tablet Oral QHS  . sevelamer carbonate  2,400 mg Oral TID WC    Assessment/ Plan:   Active Problems:   Diabetes mellitus with neuropathy   Hyperlipidemia with target LDL less than 70   Essential hypertension   End stage renal disease - on hemodialysis   CAD S/P percutaneous coronary angioplasty   Unstable angina   LBBB (left bundle branch block)  1. CAD:  S/p cutting balloon to the mid RCA Doing well. Anticipate DC tomorrow.   2.  DM - continue meds  3. Hypothyroidism:  Continue meds  4.  ESRD - on HD    Disposition:  Length of Stay: 1  Thayer Headings, Brooke Bonito., MD, [facility] 04/20/2014, 9:31 AM Office [phone] Pager [phone]

## 2014-04-21 ENCOUNTER — Encounter (HOSPITAL_COMMUNITY): Payer: Self-pay | Admitting: Physician Assistant

## 2014-04-21 ENCOUNTER — Other Ambulatory Visit: Payer: Self-pay | Admitting: Physician Assistant

## 2014-04-21 DIAGNOSIS — Z9861 Coronary angioplasty status: Secondary | ICD-10-CM

## 2014-04-21 DIAGNOSIS — R079 Chest pain, unspecified: Secondary | ICD-10-CM

## 2014-04-21 DIAGNOSIS — Z992 Dependence on renal dialysis: Secondary | ICD-10-CM

## 2014-04-21 DIAGNOSIS — I739 Peripheral vascular disease, unspecified: Principal | ICD-10-CM

## 2014-04-21 DIAGNOSIS — Z789 Other specified health status: Secondary | ICD-10-CM | POA: Diagnosis present

## 2014-04-21 DIAGNOSIS — N186 End stage renal disease: Secondary | ICD-10-CM

## 2014-04-21 DIAGNOSIS — I2510 Atherosclerotic heart disease of native coronary artery without angina pectoris: Secondary | ICD-10-CM

## 2014-04-21 DIAGNOSIS — I779 Disorder of arteries and arterioles, unspecified: Secondary | ICD-10-CM

## 2014-04-21 LAB — BASIC METABOLIC PANEL
ANION GAP: 11 (ref 5–15)
BUN: 32 mg/dL — ABNORMAL HIGH (ref 6–23)
CALCIUM: 9.2 mg/dL (ref 8.4–10.5)
CHLORIDE: 96 mmol/L (ref 96–112)
CO2: 30 mmol/L (ref 19–32)
Creatinine, Ser: 4.67 mg/dL — ABNORMAL HIGH (ref 0.50–1.35)
GFR calc Af Amer: 13 mL/min — ABNORMAL LOW (ref >90)
GFR calc non Af Amer: 11 mL/min — ABNORMAL LOW (ref >90)
Glucose, Bld: 155 mg/dL — ABNORMAL HIGH (ref 70–99)
Potassium: 3.7 mmol/L (ref 3.5–5.1)
Sodium: 137 mmol/L (ref 135–145)

## 2014-04-21 LAB — CBC
HCT: 31.5 % — ABNORMAL LOW (ref 39.0–52.0)
HEMOGLOBIN: 10.6 g/dL — AB (ref 13.0–17.0)
MCH: 31.8 pg (ref 26.0–34.0)
MCHC: 33.7 g/dL (ref 30.0–36.0)
MCV: 94.6 fL (ref 78.0–100.0)
PLATELETS: 183 10*3/uL (ref 150–400)
RBC: 3.33 MIL/uL — ABNORMAL LOW (ref 4.22–5.81)
RDW: 15.4 % (ref 11.5–15.5)
WBC: 10.3 10*3/uL (ref 4.0–10.5)

## 2014-04-21 LAB — GLUCOSE, CAPILLARY: GLUCOSE-CAPILLARY: 141 mg/dL — AB (ref 70–99)

## 2014-04-21 MED ORDER — CLOPIDOGREL BISULFATE 75 MG PO TABS: 75.0000 mg | ORAL_TABLET | Freq: Two times a day (BID) | ORAL | Status: DC

## 2014-04-21 MED ORDER — ISOSORBIDE MONONITRATE ER 60 MG PO TB24: 60.0000 mg | ORAL_TABLET | Freq: Every day | ORAL | Status: DC

## 2014-04-21 MED ORDER — ASPIRIN 81 MG PO CHEW: 81.0000 mg | CHEWABLE_TABLET | Freq: Every day | ORAL | Status: DC

## 2014-04-21 MED ORDER — CILOSTAZOL 50 MG PO TABS: 50.0000 mg | ORAL_TABLET | Freq: Two times a day (BID) | ORAL | Status: DC

## 2014-04-21 NOTE — Progress Notes (Signed)
Retro ur review

## 2014-04-21 NOTE — Progress Notes (Signed)
Doing well this AM. Had dialysis after PCI yesterday. Right groin unremarkable. Plan home today if ambulates without trouble or angina. Per Dr. Burt Knack, Plax=vix will be 75 mg BID and Pletal also added.

## 2014-04-21 NOTE — Progress Notes (Signed)
CARDIAC REHAB PHASE I   PRE:  Rate/Rhythm: 48 SR  BP:  Sitting: 114/49        SaO2: 100 RA  MODE:  Ambulation: 1000 ft   POST:  Rate/Rhythm: 84 SR  BP:  Sitting: 126/54         SaO2: 100 RA  Pt was recently here in February, has received prior education with cardiac rehab.  Pt ambulated 1000 ft, hand held assist, tolerated well.  Pt c/o some mild DOE but states this is normal for him.  C/o bilateral hip pain, states this limits his ability to tolerate extended periods of activity.  Reviewed PCI education points, antiplatelet therapy, NTG use, activity restrictions. Pt verbalized understanding. Discussed phase 2 with pt, he states it is too difficult for him to go with his dialysis schedule. Pt declined referral to phase 2 cardiac rehab at this time.   [id number]    Lenna Sciara, RN, BSN 04/21/2014 8:47 AM

## 2014-04-21 NOTE — Progress Notes (Signed)
Subjective:  No current complaints, no further chest pain, hoping for discharge today  Objective: Vital signs in last 24 hours: Temp:  [97 F (36.1 C)-98.3 F (36.8 C)] 98.2 F (36.8 C) (04/13 0557) Pulse Rate:  [31-70] 64 (04/13 0557) Resp:  [8-18] 14 (04/13 0752) BP: (85-153)/(35-88) 127/45 mmHg (04/13 0557) SpO2:  [81 %-96 %] 96 % (04/13 0557) Weight:  [68.4 kg (150 lb 12.7 oz)-70 kg (154 lb 5.2 oz)] 68.4 kg (150 lb 12.7 oz) (04/13 0500) Weight change: -3.347 kg (-7 lb 6.1 oz)  Intake/Output from previous day: 04/12 0701 - 04/13 0700 In: 380 [P.O.:320; I.V.:60] Out: 2169  Intake/Output this shift:   Lab Results:  Recent Labs  04/20/14 1450 04/21/14 0329  WBC 7.0 10.3  HGB 10.3* 10.6*  HCT 29.0* 31.5*  PLT 202 183   BMET:  Recent Labs  04/19/14 1106  04/20/14 1452 04/21/14 0329  NA  --   < > 133* 137  K  --   < > 3.4* 3.7  CL  --   < > 93* 96  CO2  --   < > 23 30  GLUCOSE  --   < > 194* 155*  BUN  --   < > 56* 32*  CREATININE  --   < > 5.96* 4.67*  CALCIUM  --   < > 9.0 9.2  ALBUMIN 3.9  --  3.5  --   < > = values in this interval not displayed. No results for input(s): PTH in the last 72 hours. Iron Studies: No results for input(s): IRON, TIBC, TRANSFERRIN, FERRITIN in the last 72 hours.  Studies/Results: No results found.  EXAM: General appearance:  Alert, in no apparent distress Resp:  CTA without rales, rhonchi, or wheezes Cardio:  RRR without murmur or rub GI:  + BS, soft and nontender Extremities:  No edema Access:  AVF @ LUA with + bruit  Dialysis Orders: Center: [facility] EDW 72 4hr 160 2K 2.25 Ca profile 4 left upper AVF 350/800 ARanesp 25 q 2 weeks - last 3/31 Hectorol 1 heparin 4000 venofer 50 per week OLC-vol 34.2  Assessment/Plan: 1. Chest pain - Hx CABG 1992, redo 2006; severe 3-vessel native CAD per cath yesterday, recurrent in-stent restenosis of mid RCA treated successfully with scoring balloon angioplasty; ASA, Plavix, and now  Pletal per Cardiology. 2. ESRD - HD on TTS @ [place], K 3.7.  HD tomorrow. 3. HTN/Volume - BP 127/45 on Carvedilol 25 mg bid; standing wt today 71.7 kg, no EDW change. 4. Anemia - Hgb 10.6, Aranesp 25 mcg & Fe on Thurs. 5. Sec HPT - Hectorol 1 mcg, Renvela 3 with meals. 6. Nutrition - renal carb-mod diet, vitamin.   LOS: 2 days   Tyffany Waldrop 04/21/2014,8:16 AM

## 2014-04-21 NOTE — Discharge Summary (Signed)
Discharge Summary   Patient ID: [name] [name] MRN: [id number], DOB/AGE: 07-27-1940 74 y.o. Admit date: 04/19/2014 D/C date:     04/21/2014  Primary Care Provider: Annye Asa, MD Primary Cardiologist: Ellyn Hack  Primary Discharge Diagnoses:  1. CAD with NSTEMI - s/p PTCA/Angiosculpt balloon dilatation of the native RCA for ISR this admission - prior history: a) s/p CABG 1992 and redo 2006. b) 10/'12: NSTEMI: PCI to SVG-OM1 Integrity BMS 3.5 x 15 (3.85 mm) c) 2/'15: UA - PCI mid RCA Xience Ap DES 2.75 x 15 (3.1 mm). d) 4/'15 : UA - focal dRCA Resolute DES 2.25 x 8 (2.5 mm). d) 10/'15: PCI to mRCA ISR w/ post stent lesion - Promus P 2.75 x 16 (3.25 mm). e) 2/'16: NSTEMI: Cutting Balloon PTCA of mRCA ISR (3.5 mm post-dilation).  2. Plavix resistance - Plavix increased to 75mg  BID and Pletal added (as this has been shown to reduce ISR in small series) - Not on Effient due to h/o stroke, did not tolerate Brilinta due to dyspnea 3. ESRD on HD T/Th/Sat 4. Chronic anemia without evidence of bleeding this admission 5. Mild LV dysfunction EF 50%  Secondary Discharge Diagnoses/PMH:  Past Medical History  Diagnosis Date  . Diabetes mellitus   . Hyperlipidemia   . Hypertension   . Gout   . Hypothyroidism     On supplementation  . LBBB (left bundle branch block)     Chronic  . Anemia     Likely secondary to history of GI bleed  . Stroke 1998  . CAD in native artery; and the grafts 1992, 1997, 2002, 2006, 2012    CABG 1992 and redo CABG 2006  . CAD (coronary artery disease) of bypass graft 2006, 2012    In 2006: Occluded SVG-OM noted (SVG-diagonal was previously occluded); 2012: Severe lesion in redo SVG-OM1 --> BMS PCI   . CAD S/P percutaneous coronary angioplasty October 2012; Feb, Apr & Oct 2015; Feb 2016    a) s/p CABG 1992 and redo 2006. b) 10/'12: NSTEMI: PCI to SVG-OM1 Integrity BMS 3.5 x 15 (3.85 mm) c) 2/'15: UA - PCI mid RCA Xience Ap DES 2.75 x 15 (3.1 mm). d) 4/'15 : UA -  focal dRCA Resolute DES 2.25 x 8 (2.5 mm). d) 10/'15: PCI to mRCA ISR w/ post stent lesion - Promus P 2.75 x 16 (3.25 mm). e) 2/'16: NSTEMI: Cutting Balloon PTCA of  mRCA ISR (3.5 mm post-dilation).  . Ischemic cardiomyopathy     a) EF 45-50% by echo 2015. b) EF 50% by cath 04/2014.  Marland Kitchen History of Non-ST elevated myocardial infarction (non-STEMI) 1992, 2006, 2012; 02/2013  . Peptic ulcer disease  2004  . BPH (benign prostatic hyperplasia)   . Chronic low back pain   . Carotid arterial disease     a) Duplex 05/2012: R BULB/PROX ICA: 50-69%, L BULB/PROX ICA: 0-49%.   . Colon cancer 2003    colectomy for CA. no recurrence . never required  radiation or chem  . End stage renal disease on dialysis     Dialyzes at [facility]: Tues/Th/Sat - left upper cavity AV fistula brachiocephalic  . Plavix resistance     a) P2Y12 264 in 02/2014 - put on Brilinta but did not tolerate due to SOB. b) cannot be on Effient due to history of stroke. c) Plavix increased to 75mg  BID and Pletal added 04/2014 due to ISR.   Hospital Course: Mr. Lamartina is a 74 y/o M with  extensive history of CAD (s/p CABG 1992/redo 2006), ICM, ESRD on HD T/Th/Sat, HTN, HLD, DM, chronic LBBB, PUD, stroke who presented to [facility] with chest pain 04/19/2014 reminiscent of prior angina. He had CABG 1992 and redo in 2006 with extensive PCIs as listed above. He was most recently admitted 02/2014 with crescendo angina and NSTEMI. And underwent successful difficult PTCA to severe mid RCA ISR, expanded to 3.71mm. He has a known occluded native LCA with widely patent LIMA-LAD, SVG-OM1 and SVG-OM2. Antiplatelet was changed to Brilinta. He was seen by Dr. Ellyn Hack in followup on 02/12/14 who stated "the most significant episodes of angina he's been having over the last year having involved his RCA lesions. The last 2 interventions have been roughly the same spot. We could consider the possibility of breaking therapy in the future [sic: brachiotherapy].  I am concerned however that he may eventually shut down his RCA. I don't think he would be a candidate for a third redo CABG." The patient called in last month requesting to change back to Plavix due to SOB and cost with Brilinta. He is not able to be on Effient due to history of stroke. Going back to Plavix improved his breathlessness. He is unwilling to take Brilinta. Last echo 02/2013: mod LVH, EF 45-50%, grade 2 DD, severely elev LVEDP, mild AI, mild-mod MR, mod LAE, mild TR.  On Thursday 4/7 the patient began to notice more frequent episodes of chest pain similar to prior angina, requiring up to 10 SL NTG per day with relief. Due to persistent sx he presented to the ER 04/19/14 for evaluation. EKG showed NSR with chronic LBBB. He was admitted for further evaluation and started on IV heparin. IV NTG used in-house in lieu of Imdur. Troponins 0.01-0.04-0.19-0.33 thus ruling him in for NSTEMI.  Since P2Y12 was 264 on Plavix in 02/2014, Dr. Johnsie Cancel has recommended empirically increasing Plavix to 75mg  BID. (As above, unable to be on Effient due to history of stroke and does not tolerate Brilinta). Cardiac cath was performed showing: 1. Severe three-vessel native CAD 2. Status post aortocoronary bypass surgery with continued patency of the LIMA to LAD, vein graft obtuse marginal, and vein graft to intermediate 3. Severe recurrent in-stent restenosis of the mid right coronary artery, treated successfully with scoring balloon angioplasty 4. Mild segmental contraction abnormality of the LV with an LVEF estimated at 50%. Dr. Burt Knack recommended to continue ASA, along with Plavix at the increased dose. Pletal was also added which was shown in small series to reduce ISR. He recommends to explore options of brachytherapy redo CABG (would be third surgery) for recurrence. Pt aware to notify of any unusual bleeding. Imdur was resumed at lower dose (60mg  BID- > 60mg  daily) as blood pressures were slightly on the lower side  (low [id number] systolic). He dialyzed after cath and did well post-PCI. He ambulated well with cardiac rehab without recurrent chest pain. Vitals stable. Dr. Tamala Julian has seen and examined the patient today and feels he is stable for discharge.  Please note the patient has h/o carotid duplex 2014 showing some carotid disease. This was planned for inpatient but was not completed. Will arrange at discharge. This will occur at [facility] office due to NL availability (pt agreeable).   Discharge Vitals: Blood pressure 114/49, pulse 73, temperature 98.1 F (36.7 C), temperature source Oral, resp. rate 16, height 5\' 5"  (1.651 m), weight 157 lb 11.2 oz (71.532 kg), SpO2 96 %.  General: Well developed, well nourished,  in no acute distress. Head: Normocephalic, atraumatic, sclera non-icteric, no xanthomas, nares are without discharge. Neck: JVP not elevated. Lungs: Clear bilaterally to auscultation without wheezes, rales, or rhonchi. Breathing is unlabored. Heart: RRR S1 S2 without murmurs, rubs, or gallops.  Abdomen: Soft, non-tender, non-distended with normoactive bowel sounds. No rebound/guarding. Extremities: No clubbing or cyanosis. No edema. Distal pedal pulses are 2+ and equal bilaterally. Right groin unremarkable without hematoma or bruit. Neuro: Alert and oriented X 3. Moves all extremities spontaneously. Psych:  Responds to questions appropriately with a normal affect.   Labs: Lab Results  Component Value Date   WBC 10.3 04/21/2014   HGB 10.6* 04/21/2014   HCT 31.5* 04/21/2014   MCV 94.6 04/21/2014   PLT 183 04/21/2014    Recent Labs Lab 04/19/14 1106  04/21/14 0329  NA  --   < > 137  K  --   < > 3.7  CL  --   < > 96  CO2  --   < > 30  BUN  --   < > 32*  CREATININE  --   < > 4.67*  CALCIUM  --   < > 9.2  PROT 6.5  --   --   BILITOT 0.8  --   --   ALKPHOS 73  --   --   ALT 20  --   --   AST 26  --   --   GLUCOSE  --   < > 155*  < > = values in this interval not displayed.  Recent  Labs  04/19/14 1106 04/19/14 1651 04/19/14 2115  TROPONINI 0.04* 0.19* 0.33*   Lab Results  Component Value Date   CHOL 68 04/20/2014   HDL 22* 04/20/2014   LDLCALC NEG 2 04/20/2014   TRIG 241* 04/20/2014    Diagnostic Studies/Procedures   Dg Chest Port 1 View  04/19/2014   CLINICAL DATA:  Mid chest pain this morning. History of coronary artery stenting. History of diabetes, hypertension.  EXAM: PORTABLE CHEST - 1 VIEW  COMPARISON:  Chest radiograph February 08, 2014  FINDINGS: The cardiac silhouette is mildly enlarged, status post median sternotomy for CABG. No pleural effusion or focal consolidation. No pneumothorax. LEFT subclavian vascular stent is new from prior chest radiograph. Osseous structure nonsuspicious.  IMPRESSION: Mild cardiomegaly, no acute pulmonary process.  New LEFT subclavian stent.   Electronically Signed   By: Elon Alas   On: 04/19/2014 06:15   Cardiac catheterization this admission, please see full report and above for summary.   Discharge Medications   Current Discharge Medication List    START taking these medications   Details  cilostazol (PLETAL) 50 MG tablet Take 1 tablet (50 mg total) by mouth 2 (two) times daily. Qty: 60 tablet, Refills: 6      CONTINUE these medications which have CHANGED   Details  aspirin 81 MG chewable tablet Chew 1 tablet (81 mg total) by mouth daily.    clopidogrel (PLAVIX) 75 MG tablet Take 1 tablet (75 mg total) by mouth 2 (two) times daily. Qty: 60 tablet, Refills: 6    isosorbide mononitrate (IMDUR) 60 MG 24 hr tablet Take 1 tablet (60 mg total) by mouth daily. Qty: 30 tablet, Refills: 6      CONTINUE these medications which have NOT CHANGED   Details  atorvastatin (LIPITOR) 40 MG tablet Take 40 mg by mouth at bedtime.    Besifloxacin HCl (BESIVANCE) 0.6 % SUSP Place 1 drop into both  eyes See admin instructions. Use eye drops 4 times daily for 2 days following injection by Dr. Zigmund Daniel (once a month)      carvedilol (COREG) 25 MG tablet Take 1 tablet (25 mg total) by mouth 2 (two) times daily with a meal.     insulin NPH Human (HUMULIN N,NOVOLIN N) 100 UNIT/ML injection Inject 10-20 Units into the skin 2 (two) times daily. Take 10 units if blood sugar is 130-150.  Take 20 units if blood sugar is >150.    levothyroxine (SYNTHROID, LEVOTHROID) 125 MCG tablet Take 1 tablet (125 mcg total) by mouth daily before breakfast.     loperamide (IMODIUM) 2 MG capsule Take 2 mg by mouth daily as needed for diarrhea or loose stools.     nitroGLYCERIN (NITROSTAT) 0.4 MG SL tablet Place 1 tablet (0.4 mg total) under the tongue every 5 (five) minutes as needed for chest pain.    Associated Diagnoses: Atherosclerosis of coronary artery bypass graft of native heart with unspecified angina pectoris    sevelamer carbonate (RENVELA) 800 MG tablet Take 3(three)  800mg  tablets by mouth with every meal.    glucose blood (PRODIGY NO CODING BLOOD GLUC) test strip Test as directed     multivitamin (RENA-VIT) TABS tablet Take 1 tablet by mouth daily.       STOP taking these medications     terazosin (HYTRIN) 2 MG capsule (no longer on this for BP per patient)         Disposition   The patient will be discharged in stable condition to home. Discharge Instructions    Diet - low sodium heart healthy    Complete by:  As directed   Renal Diet     Increase activity slowly    Complete by:  As directed   No driving for 1 week. No lifting over 10 lbs for 2 weeks. No sexual activity for 2 weeks. Keep procedure site clean & dry. If you notice increased pain, swelling, bleeding or pus, call/return!  You may shower, but no soaking baths/hot tubs/pools for 1 week.  Your Plavix was increased to 75mg  twice a day. Pletal was added. Your Imdur was decreased to 60mg  once a day. Please notify your doctor of any unusual bleeding.          Follow-up Information    Follow up with Richardson Dopp, PA-C.   Specialty:   Physician Assistant   Why:  04/28/14 - you will have carotid (neck artery) ultrasound at 8am then appointment at 9:30am with one of Dr. Allison Quarry PAs. There was no availability in the timeframe we needed at the [facility] office, so this will take place at our [place] location.   Contact information:   [id number] [place] [address] [phone]       Follow up with Dialysis.   Why:  Continue dialysis on your usual schedule.        Duration of Discharge Encounter: Greater than 30 minutes including physician and PA time.  Signed, Melina Copa PA-C 04/21/2014, 9:55 AM

## 2014-04-22 ENCOUNTER — Other Ambulatory Visit (HOSPITAL_COMMUNITY): Payer: Self-pay | Admitting: Cardiology

## 2014-04-22 ENCOUNTER — Telehealth: Payer: Self-pay | Admitting: *Deleted

## 2014-04-22 DIAGNOSIS — I6523 Occlusion and stenosis of bilateral carotid arteries: Secondary | ICD-10-CM

## 2014-04-22 NOTE — Telephone Encounter (Signed)
Transition Care Management Follow-up Telephone Call  How have you been since you were released from the hospital? "I'm doing OK, tired."     Do you understand why you were in the hospital? " I had a heart attack and that wears me out.  I'm on dialysis and that wears me out."     Do you understand the discharge instrcutions? YES   Items Reviewed:  Medications reviewed: YES   Allergies reviewed: YES   Dietary changes reviewed: YES- no changes, renal diet   Referrals reviewed: YES- patient has follow-up with cardiology scheduled as well   Functional Questionnaire:   Activities of Daily Living (ADLs):   He states they are independent in the following: all, wife is cooking and helping around the house, but patient is able to ambulate and go to bathroom independently States they require assistance with the following: housework, cooking, wife is helping   Any transportation issues/concerns?: NO    Any patient concerns? NO    Confirmed importance and date/time of follow-up visits scheduled: YES- appointment scheduled 05/03/14 at 11:00.    Confirmed with patient if condition begins to worsen call PCP or go to the ER.  Patient was given the Call-a-Nurse line [phone]: YES

## 2014-04-27 ENCOUNTER — Encounter (HOSPITAL_COMMUNITY): Payer: Self-pay | Admitting: Neurology

## 2014-04-27 ENCOUNTER — Emergency Department (HOSPITAL_COMMUNITY)
Admission: EM | Admit: 2014-04-27 | Discharge: 2014-04-27 | Disposition: A | Discharge status: Home / Self Care | Payer: Medicare Other | Attending: Emergency Medicine | Admitting: Emergency Medicine

## 2014-04-27 ENCOUNTER — Telehealth: Payer: Self-pay | Admitting: Cardiology

## 2014-04-27 ENCOUNTER — Emergency Department (HOSPITAL_COMMUNITY): Payer: Medicare Other

## 2014-04-27 ENCOUNTER — Telehealth: Payer: Self-pay | Admitting: *Deleted

## 2014-04-27 DIAGNOSIS — I2510 Atherosclerotic heart disease of native coronary artery without angina pectoris: Secondary | ICD-10-CM | POA: Insufficient documentation

## 2014-04-27 DIAGNOSIS — Z7982 Long term (current) use of aspirin: Secondary | ICD-10-CM | POA: Insufficient documentation

## 2014-04-27 DIAGNOSIS — I252 Old myocardial infarction: Secondary | ICD-10-CM | POA: Insufficient documentation

## 2014-04-27 DIAGNOSIS — E119 Type 2 diabetes mellitus without complications: Secondary | ICD-10-CM | POA: Diagnosis not present

## 2014-04-27 DIAGNOSIS — Z9889 Other specified postprocedural states: Secondary | ICD-10-CM | POA: Diagnosis not present

## 2014-04-27 DIAGNOSIS — Z9861 Coronary angioplasty status: Secondary | ICD-10-CM | POA: Diagnosis not present

## 2014-04-27 DIAGNOSIS — Z7902 Long term (current) use of antithrombotics/antiplatelets: Secondary | ICD-10-CM | POA: Insufficient documentation

## 2014-04-27 DIAGNOSIS — E039 Hypothyroidism, unspecified: Secondary | ICD-10-CM | POA: Insufficient documentation

## 2014-04-27 DIAGNOSIS — Z8739 Personal history of other diseases of the musculoskeletal system and connective tissue: Secondary | ICD-10-CM | POA: Insufficient documentation

## 2014-04-27 DIAGNOSIS — G8929 Other chronic pain: Secondary | ICD-10-CM | POA: Diagnosis not present

## 2014-04-27 DIAGNOSIS — I209 Angina pectoris, unspecified: Secondary | ICD-10-CM

## 2014-04-27 DIAGNOSIS — Z794 Long term (current) use of insulin: Secondary | ICD-10-CM | POA: Diagnosis not present

## 2014-04-27 DIAGNOSIS — Z862 Personal history of diseases of the blood and blood-forming organs and certain disorders involving the immune mechanism: Secondary | ICD-10-CM | POA: Diagnosis not present

## 2014-04-27 DIAGNOSIS — Z8711 Personal history of peptic ulcer disease: Secondary | ICD-10-CM | POA: Insufficient documentation

## 2014-04-27 DIAGNOSIS — Z85038 Personal history of other malignant neoplasm of large intestine: Secondary | ICD-10-CM | POA: Diagnosis not present

## 2014-04-27 DIAGNOSIS — R079 Chest pain, unspecified: Secondary | ICD-10-CM | POA: Diagnosis present

## 2014-04-27 DIAGNOSIS — Z79899 Other long term (current) drug therapy: Secondary | ICD-10-CM | POA: Diagnosis not present

## 2014-04-27 DIAGNOSIS — Z8673 Personal history of transient ischemic attack (TIA), and cerebral infarction without residual deficits: Secondary | ICD-10-CM | POA: Insufficient documentation

## 2014-04-27 DIAGNOSIS — I120 Hypertensive chronic kidney disease with stage 5 chronic kidney disease or end stage renal disease: Secondary | ICD-10-CM | POA: Insufficient documentation

## 2014-04-27 DIAGNOSIS — R0789 Other chest pain: Secondary | ICD-10-CM | POA: Insufficient documentation

## 2014-04-27 DIAGNOSIS — N186 End stage renal disease: Secondary | ICD-10-CM | POA: Insufficient documentation

## 2014-04-27 DIAGNOSIS — E785 Hyperlipidemia, unspecified: Secondary | ICD-10-CM | POA: Diagnosis not present

## 2014-04-27 DIAGNOSIS — Z992 Dependence on renal dialysis: Secondary | ICD-10-CM | POA: Insufficient documentation

## 2014-04-27 LAB — BASIC METABOLIC PANEL
Anion gap: 12 (ref 5–15)
BUN: 12 mg/dL (ref 6–23)
CALCIUM: 9.2 mg/dL (ref 8.4–10.5)
CO2: 31 mmol/L (ref 19–32)
CREATININE: 2.64 mg/dL — AB (ref 0.50–1.35)
Chloride: 95 mmol/L — ABNORMAL LOW (ref 96–112)
GFR calc Af Amer: 26 mL/min — ABNORMAL LOW (ref >90)
GFR calc non Af Amer: 23 mL/min — ABNORMAL LOW (ref >90)
GLUCOSE: 140 mg/dL — AB (ref 70–99)
Potassium: 3.3 mmol/L — ABNORMAL LOW (ref 3.5–5.1)
Sodium: 138 mmol/L (ref 135–145)

## 2014-04-27 LAB — CBC
HEMATOCRIT: 29.2 % — AB (ref 39.0–52.0)
HEMOGLOBIN: 9.8 g/dL — AB (ref 13.0–17.0)
MCH: 31.6 pg (ref 26.0–34.0)
MCHC: 33.6 g/dL (ref 30.0–36.0)
MCV: 94.2 fL (ref 78.0–100.0)
Platelets: 277 10*3/uL (ref 150–400)
RBC: 3.1 MIL/uL — ABNORMAL LOW (ref 4.22–5.81)
RDW: 15.5 % (ref 11.5–15.5)
WBC: 8.3 10*3/uL (ref 4.0–10.5)

## 2014-04-27 LAB — TROPONIN I: Troponin I: 0.04 ng/mL — ABNORMAL HIGH (ref <0.031)

## 2014-04-27 LAB — I-STAT TROPONIN, ED: Troponin i, poc: 0.02 ng/mL (ref 0.00–0.08)

## 2014-04-27 MED ORDER — ISOSORBIDE MONONITRATE ER 60 MG PO TB24
90.0000 mg | ORAL_TABLET | Freq: Every day | ORAL | Status: DC
  Administered 2014-04-27: 90 mg via ORAL
  Filled 2014-04-27: qty 1

## 2014-04-27 MED ORDER — ISOSORBIDE MONONITRATE ER 30 MG PO TB24: 90.0000 mg | ORAL_TABLET | Freq: Every day | ORAL | Status: DC

## 2014-04-27 MED ORDER — POTASSIUM CHLORIDE CRYS ER 20 MEQ PO TBCR
40.0000 meq | EXTENDED_RELEASE_TABLET | Freq: Once | ORAL | Status: AC
  Administered 2014-04-27: 40 meq via ORAL
  Filled 2014-04-27: qty 2

## 2014-04-27 NOTE — Telephone Encounter (Signed)
Returned call to patient's wife she stated patient started having chest pain this past Friday 04/23/14.Stated chest pain worse this morning.Stated pain is like pain he had before he had balloon procedure last week.Stated he is at dialysis at present.Stated when she picks him up from dialysis at 10:00, if he is still having chest pain she will take him to [facility] ER.

## 2014-04-27 NOTE — ED Notes (Addendum)
Pt reports last week had cath with angioplasty; last week has been having palpations; since then has been having cp and is taking nitro which is helping. Denies CP at current. No sob, no n/v.is dialysis pt, had treatment today.

## 2014-04-27 NOTE — H&P (Signed)
Patient ID: [name] [name] MRN: [id number], DOB/AGE: 11-Mar-1940   Admit date: 04/27/2014   Primary Physician: [name] Asa, MD Primary Cardiologist: [name] [name]  Pt. Profile:  [name] [name] is a 74 y.o. male with extensive history of CAD (s/p CABG 1992/redo 2006 and multiple subsequent stenting) and recent NSTEMI s/p PTCA/balloon dilatation of the native RCA for ISR last week (4/12), carotid artery disease, CVA,  ICM, ESRD on HD T/Th/Sat, HTN, HLD, DM, chronic LBBB, PUD, who presented to [facility] today with recurrent chest pain.   He had CABG 1992 and redo in 2006 with extensive PCIs as listed below. He was admitted 02/2014 with crescendo angina and NSTEMI. And underwent successful difficult PTCA to severe mid RCA ISR, expanded to 3.67mm. He has a known occluded native LCA with widely patent LIMA-LAD, SVG-OM1 and SVG-OM2. Antiplatelet was changed to Brilinta. He was seen by [name] [name] in followup on 02/12/14 who stated "the most significant episodes of angina he's been having over the last year having involved his RCA lesions. The last 2 interventions have been roughly the same spot. We could consider the possibility of breaking therapy in the future [sic: brachiotherapy]. I am concerned however that he may eventually shut down his RCA. I don't think he would be a candidate for a third redo CABG." The patient called in last month requesting to change back to Plavix due to SOB and cost with Brilinta. He is not able to be on Effient due to history of stroke. Going back to Plavix improved his breathlessness. He is unwilling to take Brilinta. Last echo 02/2013: mod LVH, EF 45-50%, grade 2 DD, severely elev LVEDP, mild AI, mild-mod MR, mod LAE, mild TR. His most recent admission was last week (04/19/14- 04/21/14) for NSTEMI. Troponins 0.01-0.04-0.19-0.33. Cardiac cath was performed showing: 1. Severe three-vessel native CAD 2. Status post aortocoronary bypass surgery with continued patency of the LIMA to  LAD, vein graft obtuse marginal, and vein graft to intermediate 3. Severe recurrent in-stent restenosis of the mid right coronary artery, treated successfully with scoring balloon angioplasty 4. Mild segmental contraction abnormality of the LV with an LVEF estimated at 50%. P2Y12 was noted to be 264 on Plavix in 02/2014, so [name] [name] has recommended empirically increasing Plavix to 75mg  BID. (As above, unable to be on Effient due to history of stroke and does not tolerate Brilinta). [name] [name] recommended to continue [name], along with Plavix at the increased dose. Pletal was also added which was shown in small series to reduce ISR. He recommended that the patient explore options of brachytherapy redo CABG (would be third surgery) for recurrence. Imdur was decreased at discharge due to soft BPs. Please note the patient has h/o carotid duplex 2014 showing some carotid disease. This was planned for inpatient but was not completed so was arranged for outpatient at discharge.   Patient was discharged last Wednesday feeling well but started to have recurrent chest pain on Friday. He had to use 8 SL NTG yesterday. He is only having exertional symptoms. This is associated with SOB and reminiscent of his previous angina. He feels fine when sitting at rest, but if he gets up to try to do anything he gets chest pain. He also had some significant palpitations. He had some CP this AM and then went to HD. He is currently CP free. During his last admission last week for NSTEMI he was having chest pain at rest. Of note, he has tried Ranexa in the  past but this did not work well and was very expensive. Imdur has historically helped his sx.   Problem List  Past Medical History  Diagnosis Date  . Diabetes mellitus   . Hyperlipidemia   . Hypertension   . Gout   . Hypothyroidism     On supplementation  . LBBB (left bundle branch block)     Chronic  . Anemia     Likely secondary to history of GI bleed  . Stroke 1998    . CAD in native artery; and the grafts 1992, 1997, 2002, 2006, 2012    CABG 1992 and redo CABG 2006  . CAD (coronary artery disease) of bypass graft 2006, 2012    In 2006: Occluded SVG-OM noted (SVG-diagonal was previously occluded); 2012: Severe lesion in redo SVG-OM1 --> BMS PCI   . CAD S/P percutaneous coronary angioplasty October 2012; Feb, Apr & Oct 2015; Feb 2016    a) s/p CABG 1992 and redo 2006. b) 10/'12: NSTEMI: PCI to SVG-OM1 Integrity BMS 3.5 x 15 (3.85 mm) c) 2/'15: UA - PCI mid RCA Xience Ap DES 2.75 x 15 (3.1 mm). d) 4/'15 : UA - focal dRCA Resolute DES 2.25 x 8 (2.5 mm). d) 10/'15: PCI to mRCA ISR w/ post stent lesion - Promus P 2.75 x 16 (3.25 mm). e) 2/'16: NSTEMI: Cutting Balloon PTCA of  mRCA ISR (3.5 mm post-dilation).  . Ischemic cardiomyopathy     a) EF 45-50% by echo 2015. b) EF 50% by cath 04/2014.  Marland Kitchen History of Non-ST elevated myocardial infarction (non-STEMI) 1992, 2006, 2012; 02/2013  . Peptic ulcer disease  2004  . BPH (benign prostatic hyperplasia)   . Chronic low back pain   . Carotid arterial disease     a) Duplex 05/2012: R BULB/PROX ICA: 50-69%, L BULB/PROX ICA: 0-49%.   . Colon cancer 2003    colectomy for CA. no recurrence . never required  radiation or chem  . End stage renal disease on dialysis     Dialyzes at [facility]: Tues/Th/Sat - left upper cavity AV fistula brachiocephalic  . Plavix resistance     a) P2Y12 264 in 02/2014 - put on Brilinta but did not tolerate due to SOB. b) cannot be on Effient due to history of stroke. c) Plavix increased to 75mg  BID and Pletal added 04/2014 due to ISR.    Past Surgical History  Procedure Laterality Date  . Appendectomy    . Cholecystectomy  2009  . Cataract extraction w/ intraocular lens  implant, bilateral Bilateral   . Posterior lumbar fusion    . Av fistula repair Left 05/2009  . Colon resection  2003    transverse and proximal descending w/ primar anastomosis  . Doppler echocardiography  01/25/2012     EF 50 to 55%  . Nm myocar perf wall motion  10/12/2011    EF 54%,LV normal ; no signifiant ischemia  . Cardiac catheterization  10/16/2010    patent  LIMA to the LAD , PATENT  svg TO 2nd marginals ,occluded PLA with collaterals and SVG to the OM  had 90% stenosis  was txlge  bare - metal  3.5  Integrity ten  postdilate  36-37  mm    . Cardiac catheterization  03/22/2004    loss of both SVG,severe disease in prox and ostial  circ not amenable to invention. mod diease distal LAD  AFTER BYPASS GRAFT;-CVTS to evaluate   . Coronary angioplasty  04/03/1995  OM and Circ  . Coronary angioplasty  02/01/2000    [facility]  . Carotid doppler  05/23/2012    ABN CAROTID-- RGT BULB/PROX ICA mild to mod 50-60%;lft bulb/prox mild to mod 0-49%;left subclavian abn waveforms consistent with patients lft arm A/V fistula  . Event monitor  01/23/2012-02/06/2012    SINUS ,LBBB,unifocal PVCs  . Percutaneous coronary stent intervention (pci-s)  02/23/2013    mid RCA 80% & 70% - Xience 2.75 mm x 15 mm ( 3.28mm) ; LIMA-LAD patent, SVG-OM patent (stent patent); SVG-Diag patent.  . Transthoracic echocardiogram  02/23/2013    Moderate concentric hypertrophy. EF 4500%. Septal bounce. Grade 2 diastolic dysfunction (pseudo-normal - severely elevated filling pressures) mild to moderate MR and moderate LA dilation to  . Percutaneous coronary stent intervention (pci-s)  April 2015    dRCA - Integrity Resolute DES   2.25 mm x 80mm (2.5 mm)  . Percutaneous coronary stent intervention (pci-s)  Oct 15 2013    [place] Angina: mRCA ISR with post-stent stenosis -- Cuting PTCA & PCI Promus Premier DES 2.75 mm x 16 mm (3.25 mm)  . Lower extremity arterial dopplers  October 2014    Calcified but non-occlusive peripheral arteries. No evidence of stenosis  . Left heart catheterization with coronary angiogram N/A 02/23/2013    Procedure: LEFT HEART CATHETERIZATION WITH CORONARY ANGIOGRAM;  Surgeon: Lorretta Harp, MD;  Location: [facility] CATH LAB;   Service: Cardiovascular;  Laterality: N/A;  . Left heart catheterization with coronary/graft angiogram N/A 04/30/2013    Procedure: LEFT HEART CATHETERIZATION WITH Beatrix Fetters;  Surgeon: Leonie Man, MD;  Location: [facility] CATH LAB;  Service: Cardiovascular;  Laterality: N/A;  . Left heart catheterization with coronary/graft angiogram N/A 10/15/2013    Procedure: LEFT HEART CATHETERIZATION WITH Beatrix Fetters;  Surgeon: Leonie Man, MD;  Location: [facility] CATH LAB;  Service: Cardiovascular;  Laterality: N/A;  . Left heart catheterization with coronary/graft angiogram N/A 02/09/2014    Procedure: LEFT HEART CATHETERIZATION WITH Beatrix Fetters;  Surgeon: Leonie Man, MD;  Location: [facility] CATH LAB;  Service: Cardiovascular;  Laterality: N/A;  . Coronary angioplasty  02/09/14    Cutting Balloon PTCA of mRCA ISR 99% - 3.5 mm  . Colon surgery  2003    "cancer"  . Coronary artery bypass graft  08/22/1990    INITIAL:LIMA to LAD, SVG to  Diagonal, SVG to OM  . Coronary artery bypass graft  2006    SVG to OM1,SVG to OM2 with patent LIMA  to LAD and occluded vein  graft to the diagonal  and occluded vein grft to OM from  prior  surgery  . Back surgery    . Left and right heart catheterization with coronary/graft angiogram N/A 04/20/2014    Procedure: LEFT AND RIGHT HEART CATHETERIZATION WITH Beatrix Fetters;  Surgeon: Sherren Mocha, MD;  Location: [facility] CATH LAB;  Service: Cardiovascular;  Laterality: N/A;     Allergies  Allergies  Allergen Reactions  . Brilinta [Ticagrelor]     Shortness of breath  . Shellfish Allergy     Causes gout flare-ups     Home Medications  Prior to Admission medications   Medication Sig Start Date End Date Taking? Authorizing Provider  aspirin 81 MG chewable tablet Chew 1 tablet (81 mg total) by mouth daily. 04/21/14   Dayna N Dunn, PA-C  atorvastatin (LIPITOR) 40 MG tablet Take 40 mg by mouth at bedtime.    Historical Provider, MD    Besifloxacin HCl (BESIVANCE) 0.6 %  SUSP Place 1 drop into both eyes See admin instructions. Use eye drops 4 times daily for 2 days following injection by Dr. Zigmund Daniel (once a month)    Historical Provider, MD  carvedilol (COREG) 25 MG tablet Take 1 tablet (25 mg total) by mouth 2 (two) times daily with a meal. 03/15/14   Midge Minium, MD  cilostazol (PLETAL) 50 MG tablet Take 1 tablet (50 mg total) by mouth 2 (two) times daily. 04/21/14   Dayna N Dunn, PA-C  clopidogrel (PLAVIX) 75 MG tablet Take 1 tablet (75 mg total) by mouth 2 (two) times daily. 04/21/14   Dayna N Dunn, PA-C  glucose blood (PRODIGY NO CODING BLOOD GLUC) test strip Test as directed 12/17/11   Midge Minium, MD  insulin NPH Human (HUMULIN N,NOVOLIN N) 100 UNIT/ML injection Inject 10-20 Units into the skin 2 (two) times daily. Take 10 units if blood sugar is 130-150.  Take 20 units if blood sugar is >150.    Historical Provider, MD  isosorbide mononitrate (IMDUR) 60 MG 24 hr tablet Take 1 tablet (60 mg total) by mouth daily. 04/21/14   Dayna N Dunn, PA-C  levothyroxine (SYNTHROID, LEVOTHROID) 125 MCG tablet Take 1 tablet (125 mcg total) by mouth daily before breakfast. 11/06/13   Midge Minium, MD  loperamide (IMODIUM) 2 MG capsule Take 2 mg by mouth daily as needed for diarrhea or loose stools.     Historical Provider, MD  multivitamin (RENA-VIT) TABS tablet Take 1 tablet by mouth daily. 12/17/11   Midge Minium, MD  nitroGLYCERIN (NITROSTAT) 0.4 MG SL tablet Place 1 tablet (0.4 mg total) under the tongue every 5 (five) minutes as needed for chest pain. 11/18/13   Midge Minium, MD  sevelamer carbonate (RENVELA) 800 MG tablet Take 3(three)  800mg  tablets by mouth with every meal.    Historical Provider, MD    Family History  Family History  Problem Relation Age of Onset  . Heart disease Mother   . Hypertension Mother   . Diabetes Mother   . Heart disease Father   . Hypertension Father   . Diabetes Father     Family Status  Relation Status Death Age  . Mother Deceased   . Father Deceased   . Brother Alive   . Child Alive   . Child Alive   . Child Alive      Social History  History   Social History  . Marital Status: Married    Spouse Name: N/A  . Number of Children: 3  . Years of Education: N/A   Occupational History  . Not on file.   Social History Main Topics  . Smoking status: Never Smoker   . Smokeless tobacco: Never Used  . Alcohol Use: No  . Drug Use: No  . Sexual Activity: No   Other Topics Concern  . Not on file   Social History Narrative   Long-term patient of Dr. Rollene Fare.   Married father of 85, grandfather 31.   Never smoked.   Not exercising D2 hip and back pain & now Angina     All other systems reviewed and are otherwise negative except as noted above.  Physical Exam  Blood pressure 136/52, pulse 71, temperature 98.1 F (36.7 C), temperature source Oral, resp. rate 18, weight 157 lb 13.6 oz (71.6 kg), SpO2 96 %.  General: Pleasant, NAD. Chronically ill appearing Psych: Normal affect. Neuro: Alert and oriented X 3. Moves all extremities spontaneously. HEENT:  Normal  Neck: Supple without bruits or JVD. Lungs:  Resp regular and unlabored, CTA. Heart: RRR no s3, s4, or murmurs. Abdomen: Soft, non-tender, non-distended, BS + x 4.  Extremities: No clubbing, cyanosis or edema. DP/PT/Radials 2+ and equal bilaterally.  Labs  No results for input(s): CKTOTAL, CKMB, TROPONINI in the last 72 hours. Lab Results  Component Value Date   WBC 8.3 04/27/2014   HGB 9.8* 04/27/2014   HCT 29.2* 04/27/2014   MCV 94.2 04/27/2014   PLT 277 04/27/2014    Recent Labs Lab 04/27/14 1138  NA 138  K 3.3*  CL 95*  CO2 31  BUN 12  CREATININE 2.64*  CALCIUM 9.2  GLUCOSE 140*   Lab Results  Component Value Date   CHOL 68 04/20/2014   HDL 22* 04/20/2014   LDLCALC NEG 2 04/20/2014   TRIG 241* 04/20/2014     Radiology/Studies  Dg Chest Port 1  View  04/27/2014   CLINICAL DATA:  Chest pain  EXAM: PORTABLE CHEST - 1 VIEW  COMPARISON:  04/19/2014  FINDINGS: Prior CABG. Cardiac enlargement without heart failure. Lungs are clear without infiltrate or mass. No pleural effusion.  IMPRESSION: No active disease.   Electronically Signed   By: Franchot Gallo M.D.   On: 04/27/2014 11:53   Dg Chest Port 1 View  04/19/2014   CLINICAL DATA:  Mid chest pain this morning. History of coronary artery stenting. History of diabetes, hypertension.  EXAM: PORTABLE CHEST - 1 VIEW  COMPARISON:  Chest radiograph February 08, 2014  FINDINGS: The cardiac silhouette is mildly enlarged, status post median sternotomy for CABG. No pleural effusion or focal consolidation. No pneumothorax. LEFT subclavian vascular stent is new from prior chest radiograph. Osseous structure nonsuspicious.  IMPRESSION: Mild cardiomegaly, no acute pulmonary process.  New LEFT subclavian stent.   Electronically Signed   By: Elon Alas   On: 04/19/2014 06:15   Cardiac cath 04/20/14 1. Severe three-vessel native CAD 2. Status post aortocoronary bypass surgery with continued patency of the LIMA to LAD, vein graft obtuse marginal, and vein graft to intermediate 3. Severe recurrent in-stent restenosis of the mid right coronary artery, treated successfully with scoring balloon angioplasty 4. Mild segmental contraction abnormality of the LV with an LVEF estimated at 50%.    ECG  HR 77 NSR. RAD, LBBB, new ST/TW changes in V5, V6. Otherwise ST dep/TWI in inferior leads similar to previous  ASSESSMENT AND PLAN  [name] [name] is a 74 y.o. male with extensive history of CAD (s/p CABG 1992/redo 2006 and multiple subsequent stenting) and recent NSTEMI s/p PTCA/balloon dilatation of the native RCA for ISR last week (4/12), carotid artery disease, CVA,  ICM, ESRD on HD T/Th/Sat, HTN, HLD, DM, chronic LBBB, and PUD who presented to [facility] today with recurrent chest pain  CAD/ Recurrent chest pain-   Recently admitted for NSTEMI  s/p PTCA/balloon dilatation of the native RCA for ISR last week (4/12). Recommended that the patient explore options of brachytherapy or redo CABG (would be third surgery) for recurrence of chest pain. -- P2Y12 was noted to be 264 on Plavix in 02/2014 so plavix was increased to 75mg  BID and Pletal was also added which was shown in small series to reduce ISR. (As above, unable to be on Effient due to history of stroke and does not tolerate Brilinta due to SOB).   -- In the ED today: Troponin neg x1. ECG with no acute ST or TW changes. Currently CP free.  We will obtain a delta troponin and if negative will send him home. He has outpatient follow up with Richardson Dopp PA-C tomorrow with follow up carotid dopplers. He has an appointment with [name] [name] on 05/13/14. -- Tried Ranexa in the past and it was not helpful and also prohibitively expensive. His BP is stable so we will increase his imdur up to 90mg  as this has helped him in the past (this was decreased last admission due to low BPs).  Hypokalemia- will supplement now. Repeat BMET tomorrow  Anemia- H/H 9.8/29.2- down from 10.6/31.5 at discharge on 04/21/14. He denies bleeding. Will have to monitor closely on [name], Plavix 75mg  BID and pletal.   ESDR on HD- he had HD today.   SignedEileen Stanford, PA-C 04/27/2014, 1:06 PM  Pager [phone]  I have examined the patient and reviewed assessment and plan and discussed with patient.  Agree with above as stated.  BP high today.  Was on Imdur 60 BID before, decreased to 60 daily.  Will increase to 90 mg daily.  BP should tolerate this.  He felt that Imdur worked well for him.  Ranexa did not work for him in the past.  If second troponin negative, will discharge later today.  He would prefer to go home.  He feels fine at this time.  His biggest concern is that interventions continue to last shorter durations.   Kellar Westberg S.

## 2014-04-27 NOTE — Telephone Encounter (Signed)
He went to ER - I see note from DR. Garvin.  [place]

## 2014-04-27 NOTE — Telephone Encounter (Signed)
[facility] started a quantity exception  - plavix 75 mg twice a day   (unabe to tolereate effient and brilinta)- awaiting for Dr Ellyn Hack to sign information to send to insurance for prior Chief Strategy Officer.

## 2014-04-27 NOTE — Discharge Instructions (Signed)
Increase imdur to 90 mg daily.   See cardiology in the office tomorrow.   Return to ER if you have worse chest pain, shortness of breath.

## 2014-04-27 NOTE — Progress Notes (Signed)
Cardiology Office Note   Date:  04/28/2014   ID:  Treysen, Ottum 1940/11/08, MRN [id number]  PCP:  Annye Asa, MD  Cardiologist:  Dr. [name]     Chief Complaint  Patient presents with  . Coronary Artery Disease  . Hospitalization Follow-up    s/p NSTEMI >> POBA to mid RCA ISR     History of Present Illness: [name] [name] is a 74 y.o. male with a hx of CAD status post 2 previous CABG revascularization surgeries and multiple PCI procedures, ischemic cardiomyopathy, ESRD on Tuesday, Thursday, Saturday dialysis, HTN, HL, diabetes, peptic ulcer disease, prior CVA. He was admitted 4/11-4/13 with a non-STEMI.  He had previously been admitted in 2/16 with a non-STEMI and underwent successful angioplasty of severe mid RCA in-stent restenosis. The patient's Brilinta was eventually changed to Plavix due to cost and adverse effect of dyspnea. During this most recent admission, troponin peaked at 0.33. P2Y12 was 264.  Plavix was increased to 75 mg twice a day. LHC demonstrated recurrent severe in-stent restenosis of the mid RCA. This was treated with a scoring balloon angioplasty. Ejection fraction was preserved at 50%.  Recommendation was to continue aspirin along with Plavix twice a day. Pletal was added to his medical regimen. It was noted that Pletal has demonstrated reduced ISR in a small series.  Isosorbide was reduced due to low blood pressures.  Patient returned to the emergency room (yesterday) 04/27/14 with recurrent chest pain. This was reminiscent of previous angina.  He had serial troponins obtained and was evaluated by Dr. Casandra Doffing. ECG was without significant change. Blood pressure was elevated and his isosorbide was increased to 90 mg. Isosorbide is traditionally helped his symptoms. He was felt to be stable and could be DC to home.  He returns for FU.  He is doing well.  Denies any further chest pain.  He denies significant DOE.  Denies orthopnea, PND, edema. No  syncope.  He tells me he had palpitations assoc with his recurrent pain that sent him back to the ED.  His K+ was 3.3 when he arrived at the ED.  He was given K+.  He denies recurrent palpitations.   Recent Labs  02/09/14 0400 04/19/14 0533 04/19/14 1106 04/19/14 1651 04/19/14 2115 04/27/14 1131 04/27/14 1430  TROPONINI 0.44*  --  0.04* 0.19* 0.33*  --  0.04*  TROPIPOC  --  0.01  --   --   --  0.02  --      Studies/Reports Reviewed Today:  LHC/PCI 04/20/14 LM: Diffuse 80% LAD: Proximal 100% LCx: Proximally occluded RCA: Mid stent 95% ISR, distal stent patent SVG-OM1: Patent SVG-RI: Patent LIMA-LAD: Patent LVEF: 50% PCI: Scoring balloon angioplasty to the mid RCA ISR  Echo 02/23/13 Moderate concentric hypertrophy, EF [id number], grade 2 diastolic dysfunction Mild AI Mild to moderate MR Moderate LAE Mild TR  Carotid US 0000000 RICA 0000000  LICA [id number]   Past Medical History  Diagnosis Date  . Diabetes mellitus   . Hyperlipidemia   . Hypertension   . Gout   . Hypothyroidism     On supplementation  . LBBB (left bundle branch block)     Chronic  . Anemia     Likely secondary to history of GI bleed  . Stroke 1998  . CAD in native artery; and the grafts 1992, 1997, 2002, 2006, 2012    CABG 1992 and redo CABG 2006  . CAD (coronary artery disease) of bypass graft 2006,  2012    In 2006: Occluded SVG-OM noted (SVG-diagonal was previously occluded); 2012: Severe lesion in redo SVG-OM1 --> BMS PCI   . CAD S/P percutaneous coronary angioplasty October 2012; Feb, Apr & Oct 2015; Feb 2016    a) s/p CABG 1992 and redo 2006. b) 10/'12: NSTEMI: PCI to SVG-OM1 Integrity BMS 3.5 x 15 (3.85 mm) c) 2/'15: UA - PCI mid RCA Xience Ap DES 2.75 x 15 (3.1 mm). d) 4/'15 : UA - focal dRCA Resolute DES 2.25 x 8 (2.5 mm). d) 10/'15: PCI to mRCA ISR w/ post stent lesion - Promus P 2.75 x 16 (3.25 mm). e) 2/'16: NSTEMI: Cutting Balloon PTCA of  mRCA ISR (3.5 mm post-dilation).  . Ischemic  cardiomyopathy     a) EF 45-50% by echo 2015. b) EF 50% by cath 04/2014.  Marland Kitchen History of Non-ST elevated myocardial infarction (non-STEMI) 1992, 2006, 2012; 02/2013  . Peptic ulcer disease  2004  . BPH (benign prostatic hyperplasia)   . Chronic low back pain   . Carotid arterial disease     a) Duplex 05/2012: R BULB/PROX ICA: 50-69%, L BULB/PROX ICA: 0-49%.   . Colon cancer 2003    colectomy for CA. no recurrence . never required  radiation or chem  . End stage renal disease on dialysis     Dialyzes at [facility]: Tues/Th/Sat - left upper cavity AV fistula brachiocephalic  . Plavix resistance     a) P2Y12 264 in 02/2014 - put on Brilinta but did not tolerate due to SOB. b) cannot be on Effient due to history of stroke. c) Plavix increased to 75mg  BID and Pletal added 04/2014 due to ISR.    Past Surgical History  Procedure Laterality Date  . Appendectomy    . Cholecystectomy  2009  . Cataract extraction w/ intraocular lens  implant, bilateral Bilateral   . Posterior lumbar fusion    . Av fistula repair Left 05/2009  . Colon resection  2003    transverse and proximal descending w/ primar anastomosis  . Doppler echocardiography  01/25/2012    EF 50 to 55%  . Nm myocar perf wall motion  10/12/2011    EF 54%,LV normal ; no signifiant ischemia  . Cardiac catheterization  10/16/2010    patent  LIMA to the LAD , PATENT  svg TO 2nd marginals ,occluded PLA with collaterals and SVG to the OM  had 90% stenosis  was txlge  bare - metal  3.5  Integrity ten  postdilate  36-37  mm    . Cardiac catheterization  03/22/2004    loss of both SVG,severe disease in prox and ostial  circ not amenable to invention. mod diease distal LAD  AFTER BYPASS GRAFT;-CVTS to evaluate   . Coronary angioplasty  04/03/1995    OM and Circ  . Coronary angioplasty  02/01/2000    [facility]  . Carotid doppler  05/23/2012    ABN CAROTID-- RGT BULB/PROX ICA mild to mod 50-60%;lft bulb/prox mild to mod 0-49%;left subclavian abn  waveforms consistent with patients lft arm A/V fistula  . Event monitor  01/23/2012-02/06/2012    SINUS ,LBBB,unifocal PVCs  . Percutaneous coronary stent intervention (pci-s)  02/23/2013    mid RCA 80% & 70% - Xience 2.75 mm x 15 mm ( 3.35mm) ; LIMA-LAD patent, SVG-OM patent (stent patent); SVG-Diag patent.  . Transthoracic echocardiogram  02/23/2013    Moderate concentric hypertrophy. EF 4500%. Septal bounce. Grade 2 diastolic dysfunction (pseudo-normal - severely elevated  filling pressures) mild to moderate MR and moderate LA dilation to  . Percutaneous coronary stent intervention (pci-s)  April 2015    dRCA - Integrity Resolute DES   2.25 mm x 31mm (2.5 mm)  . Percutaneous coronary stent intervention (pci-s)  Oct 15 2013    [place] Angina: mRCA ISR with post-stent stenosis -- Cuting PTCA & PCI Promus Premier DES 2.75 mm x 16 mm (3.25 mm)  . Lower extremity arterial dopplers  October 2014    Calcified but non-occlusive peripheral arteries. No evidence of stenosis  . Left heart catheterization with coronary angiogram N/A 02/23/2013    Procedure: LEFT HEART CATHETERIZATION WITH CORONARY ANGIOGRAM;  Surgeon: Lorretta Harp, MD;  Location: [facility] CATH LAB;  Service: Cardiovascular;  Laterality: N/A;  . Left heart catheterization with coronary/graft angiogram N/A 04/30/2013    Procedure: LEFT HEART CATHETERIZATION WITH Beatrix Fetters;  Surgeon: Leonie Man, MD;  Location: [facility] CATH LAB;  Service: Cardiovascular;  Laterality: N/A;  . Left heart catheterization with coronary/graft angiogram N/A 10/15/2013    Procedure: LEFT HEART CATHETERIZATION WITH Beatrix Fetters;  Surgeon: Leonie Man, MD;  Location: [facility] CATH LAB;  Service: Cardiovascular;  Laterality: N/A;  . Left heart catheterization with coronary/graft angiogram N/A 02/09/2014    Procedure: LEFT HEART CATHETERIZATION WITH Beatrix Fetters;  Surgeon: Leonie Man, MD;  Location: [facility] CATH LAB;  Service: Cardiovascular;   Laterality: N/A;  . Coronary angioplasty  02/09/14    Cutting Balloon PTCA of mRCA ISR 99% - 3.5 mm  . Colon surgery  2003    "cancer"  . Coronary artery bypass graft  08/22/1990    INITIAL:LIMA to LAD, SVG to  Diagonal, SVG to OM  . Coronary artery bypass graft  2006    SVG to OM1,SVG to OM2 with patent LIMA  to LAD and occluded vein  graft to the diagonal  and occluded vein grft to OM from  prior  surgery  . Back surgery    . Left and right heart catheterization with coronary/graft angiogram N/A 04/20/2014    Procedure: LEFT AND RIGHT HEART CATHETERIZATION WITH Beatrix Fetters;  Surgeon: Sherren Mocha, MD;  Location: [facility] CATH LAB;  Service: Cardiovascular;  Laterality: N/A;     Current Outpatient Prescriptions  Medication Sig Dispense Refill  . aspirin 81 MG chewable tablet Chew 1 tablet (81 mg total) by mouth daily.    Marland Kitchen atorvastatin (LIPITOR) 40 MG tablet Take 40 mg by mouth at bedtime.    Marland Kitchen Besifloxacin HCl (BESIVANCE) 0.6 % SUSP Place 1 drop into both eyes See admin instructions. Use eye drops 4 times daily for 2 days following injection by Dr. Zigmund Daniel (once a month)    . carvedilol (COREG) 25 MG tablet Take 1 tablet (25 mg total) by mouth 2 (two) times daily with a meal. 180 tablet 1  . cilostazol (PLETAL) 50 MG tablet Take 1 tablet (50 mg total) by mouth 2 (two) times daily. 60 tablet 6  . clopidogrel (PLAVIX) 75 MG tablet Take 1 tablet (75 mg total) by mouth 2 (two) times daily. 60 tablet 6  . glucose blood (PRODIGY NO CODING BLOOD GLUC) test strip Test as directed 100 each 6  . insulin NPH Human (HUMULIN N,NOVOLIN N) 100 UNIT/ML injection Inject 10-20 Units into the skin 2 (two) times daily. Take 10 units if blood sugar is 130-150.  Take 20 units if blood sugar is >150.    Marland Kitchen isosorbide mononitrate (IMDUR) 30 MG 24 hr tablet Take  3 tablets (90 mg total) by mouth daily. 60 tablet 0  . levothyroxine (SYNTHROID, LEVOTHROID) 125 MCG tablet Take 1 tablet (125 mcg total) by mouth  daily before breakfast. 90 tablet 1  . loperamide (IMODIUM) 2 MG capsule Take 2 mg by mouth daily as needed for diarrhea or loose stools.     . multivitamin (RENA-VIT) TABS tablet Take 1 tablet by mouth daily. 90 tablet 3  . nitroGLYCERIN (NITROSTAT) 0.4 MG SL tablet Place 1 tablet (0.4 mg total) under the tongue every 5 (five) minutes as needed for chest pain. 100 tablet 4  . sevelamer carbonate (RENVELA) 800 MG tablet Take 3(three)  800mg  tablets by mouth with every meal.     No current facility-administered medications for this visit.    Allergies:   Brilinta and Shellfish allergy    Social History:  The patient  reports that he has never smoked. He has never used smokeless tobacco. He reports that he does not drink alcohol or use illicit drugs.   Family History:  The patient's family history includes Diabetes in his father and mother; Heart attack in his father and mother; Heart disease in his father and mother; Hypertension in his father and mother; Stroke in his paternal aunt and paternal uncle.    ROS:   Please see the history of present illness.   Review of Systems  Constitution: Negative for chills and fever.  Respiratory: Negative for cough.   Gastrointestinal: Negative for hematochezia and melena.  All other systems reviewed and are negative.    PHYSICAL EXAM: VS:  BP 125/50 mmHg  Pulse 65  Ht 5\' 5"  (1.651 m)  Wt 160 lb (72.576 kg)  BMI 26.63 kg/m2    Wt Readings from Last 3 Encounters:  04/28/14 160 lb (72.576 kg)  04/27/14 157 lb 13.6 oz (71.6 kg)  04/21/14 157 lb 11.2 oz (71.532 kg)     GEN: Well nourished, well developed, in no acute distress HEENT: normal Neck: no JVD, no carotid bruits, no masses Cardiac:  Normal S1/S2, RRR; no murmur ,  no rubs or gallops, no edema. R groin without hematoma or bruit  Respiratory:  clear to auscultation bilaterally, no wheezing, rhonchi or rales. GI: soft, nontender, nondistended, + BS MS: no deformity or atrophy Skin:  warm and dry  Neuro:  CNs II-XII intact, Strength and sensation are intact Psych: Normal affect   EKG:  EKG is ordered today.  It demonstrates:   NSR, HR 65, LBBB, no change from prior tracing   Recent Labs: 04/19/2014: ALT 20; TSH 0.164* 04/27/2014: BUN 12; Creatinine 2.64*; Hemoglobin 9.8*; Platelets 277; Potassium 3.3*; Sodium 138    Lipid Panel    Component Value Date/Time   CHOL 68 04/20/2014 0425   TRIG 241* 04/20/2014 0425   HDL 22* 04/20/2014 0425   CHOLHDL 3.1 04/20/2014 0425   VLDL 48* 04/20/2014 0425   [place] NEG 2 04/20/2014 0425   LDLDIRECT 33.5 12/22/2012 0946     ASSESSMENT AND PLAN:  Coronary artery disease involving native coronary artery of native heart without angina pectoris He is currently not having any angina. He seems to be tolerating his current medical regimen which includes aspirin, Pletal, clopidogrel twice a day, beta blocker, isosorbide 90, statin. I reviewed his case today with Dr. Ron Parker. We do not feel that he needs further testing at this time. He already has follow-up with Dr. Ellyn Hack in a couple of weeks. He should keep that appointment. As noted, the interventional team was  checking on the possibility of brachytherapy. They are trying to find an institution that still does this.    Ischemic cardiomyopathy EF 50% by recent cardiac catheterization. Continue beta blocker, nitrates. Consider adding ACE inhibitor  Essential hypertension Controlled.  Hyperlipidemia Continue statin.  End stage renal disease  - on hemodialysis  Carotid stenosis, bilateral  Follow-up carotid Dopplers performed today. Results are currently pending.  Current medicines are reviewed at length with the patient today.  Concerns regarding medicines are as outlined above.  The following changes have been made:    None    Labs/ tests ordered today include:   Orders Placed This Encounter  Procedures  . EKG 12-Lead    Disposition:   FU with Dr. [name]  05/13/14 as planned.    Signed, Versie Starks, MHS 04/28/2014 9:49 AM    [facility] [address], [place], [place]  [address] Phone: [phone]; Fax: [phone]

## 2014-04-27 NOTE — Telephone Encounter (Signed)
Pt had procedure at [facility] last Tuesday(04-20-14), He is having chest pains again.please call to advise. If not at this number,please call [phone].

## 2014-04-27 NOTE — ED Provider Notes (Signed)
CSN: [id number]     Arrival date & time 04/27/14  1052 History   First MD Initiated Contact with Patient 04/27/14 1104     Chief Complaint  Patient presents with  . Chest Pain     (Consider location/radiation/quality/duration/timing/severity/associated sxs/prior Treatment) The history is provided by the patient.  [name] [name] is a 74 y.o. male hx of DM, HL, HTN, LBBB, CAD s/p stent with recent cath and balloon dilation to RCA here with chest pain. Just admitted and had RCA dilated with balloon several days ago. Went home 6 days ago. 4 days ago, started having intermittent substernal chest pain. Took nitro and resolved. Yesterday, had more frequent chest pain and required 8 nitros. Denies shortness of breath or fever or cough. Just finished dialysis today.    Past Medical History  Diagnosis Date  . Diabetes mellitus   . Hyperlipidemia   . Hypertension   . Gout   . Hypothyroidism     On supplementation  . LBBB (left bundle branch block)     Chronic  . Anemia     Likely secondary to history of GI bleed  . Stroke 1998  . CAD in native artery; and the grafts 1992, 1997, 2002, 2006, 2012    CABG 1992 and redo CABG 2006  . CAD (coronary artery disease) of bypass graft 2006, 2012    In 2006: Occluded SVG-OM noted (SVG-diagonal was previously occluded); 2012: Severe lesion in redo SVG-OM1 --> BMS PCI   . CAD S/P percutaneous coronary angioplasty October 2012; Feb, Apr & Oct 2015; Feb 2016    a) s/p CABG 1992 and redo 2006. b) 10/'12: NSTEMI: PCI to SVG-OM1 Integrity BMS 3.5 x 15 (3.85 mm) c) 2/'15: UA - PCI mid RCA Xience Ap DES 2.75 x 15 (3.1 mm). d) 4/'15 : UA - focal dRCA Resolute DES 2.25 x 8 (2.5 mm). d) 10/'15: PCI to mRCA ISR w/ post stent lesion - Promus P 2.75 x 16 (3.25 mm). e) 2/'16: NSTEMI: Cutting Balloon PTCA of  mRCA ISR (3.5 mm post-dilation).  . Ischemic cardiomyopathy     a) EF 45-50% by echo 2015. b) EF 50% by cath 04/2014.  Marland Kitchen History of Non-ST elevated myocardial  infarction (non-STEMI) 1992, 2006, 2012; 02/2013  . Peptic ulcer disease  2004  . BPH (benign prostatic hyperplasia)   . Chronic low back pain   . Carotid arterial disease     a) Duplex 05/2012: R BULB/PROX ICA: 50-69%, L BULB/PROX ICA: 0-49%.   . Colon cancer 2003    colectomy for CA. no recurrence . never required  radiation or chem  . End stage renal disease on dialysis     Dialyzes at [facility]: Tues/Th/Sat - left upper cavity AV fistula brachiocephalic  . Plavix resistance     a) P2Y12 264 in 02/2014 - put on Brilinta but did not tolerate due to SOB. b) cannot be on Effient due to history of stroke. c) Plavix increased to 75mg  BID and Pletal added 04/2014 due to ISR.   Past Surgical History  Procedure Laterality Date  . Appendectomy    . Cholecystectomy  2009  . Cataract extraction w/ intraocular lens  implant, bilateral Bilateral   . Posterior lumbar fusion    . Av fistula repair Left 05/2009  . Colon resection  2003    transverse and proximal descending w/ primar anastomosis  . Doppler echocardiography  01/25/2012    EF 50 to 55%  . Nm myocar perf  wall motion  10/12/2011    EF 54%,LV normal ; no signifiant ischemia  . Cardiac catheterization  10/16/2010    patent  LIMA to the LAD , PATENT  svg TO 2nd marginals ,occluded PLA with collaterals and SVG to the OM  had 90% stenosis  was txlge  bare - metal  3.5  Integrity ten  postdilate  36-37  mm    . Cardiac catheterization  03/22/2004    loss of both SVG,severe disease in prox and ostial  circ not amenable to invention. mod diease distal LAD  AFTER BYPASS GRAFT;-CVTS to evaluate   . Coronary angioplasty  04/03/1995    OM and Circ  . Coronary angioplasty  02/01/2000    [facility]  . Carotid doppler  05/23/2012    ABN CAROTID-- RGT BULB/PROX ICA mild to mod 50-60%;lft bulb/prox mild to mod 0-49%;left subclavian abn waveforms consistent with patients lft arm A/V fistula  . Event monitor  01/23/2012-02/06/2012    SINUS ,LBBB,unifocal  PVCs  . Percutaneous coronary stent intervention (pci-s)  02/23/2013    mid RCA 80% & 70% - Xience 2.75 mm x 15 mm ( 3.15mm) ; LIMA-LAD patent, SVG-OM patent (stent patent); SVG-Diag patent.  . Transthoracic echocardiogram  02/23/2013    Moderate concentric hypertrophy. EF 4500%. Septal bounce. Grade 2 diastolic dysfunction (pseudo-normal - severely elevated filling pressures) mild to moderate MR and moderate LA dilation to  . Percutaneous coronary stent intervention (pci-s)  April 2015    dRCA - Integrity Resolute DES   2.25 mm x 66mm (2.5 mm)  . Percutaneous coronary stent intervention (pci-s)  Oct 15 2013    [place] Angina: mRCA ISR with post-stent stenosis -- Cuting PTCA & PCI Promus Premier DES 2.75 mm x 16 mm (3.25 mm)  . Lower extremity arterial dopplers  October 2014    Calcified but non-occlusive peripheral arteries. No evidence of stenosis  . Left heart catheterization with coronary angiogram N/A 02/23/2013    Procedure: LEFT HEART CATHETERIZATION WITH CORONARY ANGIOGRAM;  Surgeon: Lorretta Harp, MD;  Location: [facility] CATH LAB;  Service: Cardiovascular;  Laterality: N/A;  . Left heart catheterization with coronary/graft angiogram N/A 04/30/2013    Procedure: LEFT HEART CATHETERIZATION WITH Beatrix Fetters;  Surgeon: Leonie Man, MD;  Location: [facility] CATH LAB;  Service: Cardiovascular;  Laterality: N/A;  . Left heart catheterization with coronary/graft angiogram N/A 10/15/2013    Procedure: LEFT HEART CATHETERIZATION WITH Beatrix Fetters;  Surgeon: Leonie Man, MD;  Location: [facility] CATH LAB;  Service: Cardiovascular;  Laterality: N/A;  . Left heart catheterization with coronary/graft angiogram N/A 02/09/2014    Procedure: LEFT HEART CATHETERIZATION WITH Beatrix Fetters;  Surgeon: Leonie Man, MD;  Location: [facility] CATH LAB;  Service: Cardiovascular;  Laterality: N/A;  . Coronary angioplasty  02/09/14    Cutting Balloon PTCA of mRCA ISR 99% - 3.5 mm  . Colon surgery   2003    "cancer"  . Coronary artery bypass graft  08/22/1990    INITIAL:LIMA to LAD, SVG to  Diagonal, SVG to OM  . Coronary artery bypass graft  2006    SVG to OM1,SVG to OM2 with patent LIMA  to LAD and occluded vein  graft to the diagonal  and occluded vein grft to OM from  prior  surgery  . Back surgery    . Left and right heart catheterization with coronary/graft angiogram N/A 04/20/2014    Procedure: LEFT AND RIGHT HEART CATHETERIZATION WITH Beatrix Fetters;  Surgeon: Sherren Mocha,  MD;  Location: [facility] CATH LAB;  Service: Cardiovascular;  Laterality: N/A;   Family History  Problem Relation Age of Onset  . Heart disease Mother   . Hypertension Mother   . Diabetes Mother   . Heart disease Father   . Hypertension Father   . Diabetes Father    History  Substance Use Topics  . Smoking status: Never Smoker   . Smokeless tobacco: Never Used  . Alcohol Use: No    Review of Systems  Cardiovascular: Positive for chest pain.  All other systems reviewed and are negative.     Allergies  Brilinta and Shellfish allergy  Home Medications   Prior to Admission medications   Medication Sig Start Date End Date Taking? Authorizing Provider  aspirin 81 MG chewable tablet Chew 1 tablet (81 mg total) by mouth daily. 04/21/14  Yes Dayna N Dunn, PA-C  atorvastatin (LIPITOR) 40 MG tablet Take 40 mg by mouth at bedtime.   Yes Historical Provider, MD  Besifloxacin HCl (BESIVANCE) 0.6 % SUSP Place 1 drop into both eyes See admin instructions. Use eye drops 4 times daily for 2 days following injection by Dr. Zigmund Daniel (once a month)   Yes Historical Provider, MD  carvedilol (COREG) 25 MG tablet Take 1 tablet (25 mg total) by mouth 2 (two) times daily with a meal. 03/15/14  Yes Midge Minium, MD  cilostazol (PLETAL) 50 MG tablet Take 1 tablet (50 mg total) by mouth 2 (two) times daily. 04/21/14  Yes Dayna N Dunn, PA-C  clopidogrel (PLAVIX) 75 MG tablet Take 1 tablet (75 mg total) by mouth 2  (two) times daily. 04/21/14  Yes Dayna N Dunn, PA-C  glucose blood (PRODIGY NO CODING BLOOD GLUC) test strip Test as directed 12/17/11  Yes Midge Minium, MD  insulin NPH Human (HUMULIN N,NOVOLIN N) 100 UNIT/ML injection Inject 10-20 Units into the skin 2 (two) times daily. Take 10 units if blood sugar is 130-150.  Take 20 units if blood sugar is >150.   Yes Historical Provider, MD  isosorbide mononitrate (IMDUR) 60 MG 24 hr tablet Take 1 tablet (60 mg total) by mouth daily. 04/21/14  Yes Dayna N Dunn, PA-C  levothyroxine (SYNTHROID, LEVOTHROID) 125 MCG tablet Take 1 tablet (125 mcg total) by mouth daily before breakfast. 11/06/13  Yes Midge Minium, MD  loperamide (IMODIUM) 2 MG capsule Take 2 mg by mouth daily as needed for diarrhea or loose stools.    Yes Historical Provider, MD  multivitamin (RENA-VIT) TABS tablet Take 1 tablet by mouth daily. 12/17/11  Yes Midge Minium, MD  nitroGLYCERIN (NITROSTAT) 0.4 MG SL tablet Place 1 tablet (0.4 mg total) under the tongue every 5 (five) minutes as needed for chest pain. 11/18/13  Yes Midge Minium, MD  sevelamer carbonate (RENVELA) 800 MG tablet Take 3(three)  800mg  tablets by mouth with every meal.   Yes Historical Provider, MD   BP 134/56 mmHg  Pulse 69  Temp(Src) 98.1 F (36.7 C) (Oral)  Resp 18  Wt 157 lb 13.6 oz (71.6 kg)  SpO2 97% Physical Exam  Constitutional: He is oriented to person, place, and time.  Chronically ill, NAD   HENT:  Head: Normocephalic.  Mouth/Throat: Oropharynx is clear and moist.  Eyes: Conjunctivae are normal. Pupils are equal, round, and reactive to light.  Neck: Normal range of motion. Neck supple.  Cardiovascular: Normal rate, regular rhythm and normal heart sounds.   Pulmonary/Chest: Effort normal.  Bibasilar crackles   Abdominal:  Soft. Bowel sounds are normal. He exhibits no distension. There is no tenderness. There is no rebound.  Musculoskeletal: Normal range of motion.  Neurological: He is  alert and oriented to person, place, and time.  Skin: Skin is warm and dry.  Psychiatric: He has a normal mood and affect. His behavior is normal. Judgment and thought content normal.  Nursing note and vitals reviewed.   ED Course  Procedures (including critical care time) Labs Review Labs Reviewed  CBC - Abnormal; Notable for the following:    RBC 3.10 (*)    Hemoglobin 9.8 (*)    HCT 29.2 (*)    All other components within normal limits  BASIC METABOLIC PANEL - Abnormal; Notable for the following:    Potassium 3.3 (*)    Chloride 95 (*)    Glucose, Bld 140 (*)    Creatinine, Ser 2.64 (*)    GFR calc non Af Amer 23 (*)    GFR calc Af Amer 26 (*)    All other components within normal limits  Randolm Idol, ED    Imaging Review Dg Chest Port 1 View  04/27/2014   CLINICAL DATA:  Chest pain  EXAM: PORTABLE CHEST - 1 VIEW  COMPARISON:  04/19/2014  FINDINGS: Prior CABG. Cardiac enlargement without heart failure. Lungs are clear without infiltrate or mass. No pleural effusion.  IMPRESSION: No active disease.   Electronically Signed   By: Franchot Gallo M.D.   On: 04/27/2014 11:53     EKG Interpretation   Date/Time:  Tuesday April 27 2014 10:56:46 EDT Ventricular Rate:  77 PR Interval:  174 QRS Duration: 166 QT Interval:  468 QTC Calculation: 529 R Axis:   121 Text Interpretation:  Normal sinus rhythm Right axis deviation Left bundle  branch block Abnormal ECG Sinus rhythm T wave abnormality Non-specific  intra-ventricular conduction delay Abnormal ekg Confirmed by Carmin Muskrat  MD [phone]) on 04/27/2014 11:01:57 AM      MDM   Final diagnoses:  None    MICHIAH KROCKER is a 74 y.o. male here with chest pain. Some nonspecific changes to EKG today. Pain free now. Will consult cardiology.   1:48 PM Cardiology saw patient. Will increase Imdur. Recommend delta trop, if neg can dc home. Has f/u with cardiology tomorrow.   3:31 PM Trop mildly positive, 0.04. I  discussed with Dr. Irish Lack and he felt that given patient is pain free, can be discharged with close follow up tomorrow. Patient and family comfortable with that plan.   Wandra Arthurs, MD 04/27/14 [phone]

## 2014-04-28 ENCOUNTER — Encounter: Payer: Self-pay | Admitting: Physician Assistant

## 2014-04-28 ENCOUNTER — Ambulatory Visit (INDEPENDENT_AMBULATORY_CARE_PROVIDER_SITE_OTHER): Payer: Medicare Other | Admitting: Physician Assistant

## 2014-04-28 ENCOUNTER — Ambulatory Visit (HOSPITAL_COMMUNITY): Payer: Medicare Other | Attending: Physician Assistant

## 2014-04-28 VITALS — BP 125/50 | HR 65 | Ht 65.0 in | Wt 160.0 lb

## 2014-04-28 DIAGNOSIS — I200 Unstable angina: Secondary | ICD-10-CM

## 2014-04-28 DIAGNOSIS — N186 End stage renal disease: Secondary | ICD-10-CM

## 2014-04-28 DIAGNOSIS — I2510 Atherosclerotic heart disease of native coronary artery without angina pectoris: Secondary | ICD-10-CM | POA: Diagnosis not present

## 2014-04-28 DIAGNOSIS — I6523 Occlusion and stenosis of bilateral carotid arteries: Secondary | ICD-10-CM

## 2014-04-28 DIAGNOSIS — I10 Essential (primary) hypertension: Secondary | ICD-10-CM

## 2014-04-28 DIAGNOSIS — E785 Hyperlipidemia, unspecified: Secondary | ICD-10-CM | POA: Diagnosis not present

## 2014-04-28 DIAGNOSIS — I255 Ischemic cardiomyopathy: Secondary | ICD-10-CM

## 2014-04-28 NOTE — Progress Notes (Signed)
Carotid duplex scan performed 

## 2014-04-28 NOTE — Patient Instructions (Signed)
Medication Instructions:  Your physician recommends that you continue on your current medications as directed. Please refer to the Current Medication list given to you today.   Labwork: NONE  Testing/Procedures: NONE  Follow-Up: KEEP YOUR FOLLOW UP WITH DR. HARDING ON 05/13/14  Any Other Special Instructions Will Be Listed Below (If Applicable).

## 2014-04-30 NOTE — Telephone Encounter (Signed)
Faxed prior authorization to [facility]

## 2014-05-03 ENCOUNTER — Telehealth: Payer: Self-pay | Admitting: *Deleted

## 2014-05-03 ENCOUNTER — Encounter: Payer: Self-pay | Admitting: Family Medicine

## 2014-05-03 ENCOUNTER — Ambulatory Visit (INDEPENDENT_AMBULATORY_CARE_PROVIDER_SITE_OTHER): Payer: Medicare Other | Admitting: Family Medicine

## 2014-05-03 VITALS — BP 128/70 | HR 71 | Temp 98.0°F | Resp 16 | Wt 164.1 lb

## 2014-05-03 DIAGNOSIS — I2510 Atherosclerotic heart disease of native coronary artery without angina pectoris: Secondary | ICD-10-CM

## 2014-05-03 DIAGNOSIS — Z9861 Coronary angioplasty status: Secondary | ICD-10-CM | POA: Diagnosis not present

## 2014-05-03 NOTE — Progress Notes (Signed)
   Subjective:    Patient ID: [name] [name], male    DOB: [birth year], 74 y.o.   MRN: [id number]  [facility] f/u- pt was admitted 4/11-13 w/ NSTEMI and balloon PTCA.  Due to his plavix resistance, he increased Plavix to BID and added Pletal.  Pt had to go back to ER on 4/19 w/ similar CP- was told there was nothing to do and f/u w/ Cards.  Pt had f/u w/ cards on 4/20.  Pt is back to his baseline level of pain/functioning- CP and SOB w/ exertion.  No edema.  Pt doesn't report any improvement since adding Pletal- 'i still have CP is all i know'.  Groin site from cath just stopped oozing last week, still tender.   Review of Systems For ROS see HPI     Objective:   Physical Exam  Constitutional: He is oriented to person, place, and time. He appears well-developed and well-nourished. No distress.  HENT:  Head: Normocephalic and atraumatic.  Cardiovascular: Normal rate, regular rhythm and intact distal pulses.   Pulmonary/Chest: Breath sounds normal. No respiratory distress. He has no wheezes. He has no rales.  Musculoskeletal: He exhibits no edema.  Neurological: He is alert and oriented to person, place, and time. No cranial nerve deficit. Coordination normal.  Skin: Skin is warm and dry. No rash noted. No erythema.  Psychiatric: He has a normal mood and affect. His behavior is normal. Thought content normal.  Vitals reviewed.         Assessment & Plan:

## 2014-05-03 NOTE — Patient Instructions (Signed)
Follow up w/ Dr Ellyn Hack as scheduled If you have worsening pain- please go back to the ER or call Cardiology Call with any questions or concerns Hang in there!!!

## 2014-05-03 NOTE — Telephone Encounter (Signed)
RECEIVED AUTHORIZATION 04/30/14 , FOR PLAVIX 75 MG FOR TWICE A DAY.

## 2014-05-03 NOTE — Progress Notes (Signed)
Pre visit review using our clinic review tool, if applicable. No additional management support is needed unless otherwise documented below in the visit note. 

## 2014-05-04 NOTE — Assessment & Plan Note (Signed)
Pt and wife are clearly and appropriately concerned about his dire situation.  Basically, he is hearing that there is nothing left to be done regarding his recurring MI's.  One suggestion was to allow him to infarct and 'if he survived, the part of his heart that has been hurting would be dead and won't bother him anymore'.  Pt is terrified of this idea and states this will be last ditch effort.  Listened to both pt and wife's concerns over this very serious situation and offered my support but at this time, there is nothing for me to do.  Will continue to follow closely.

## 2014-05-06 DIAGNOSIS — E1129 Type 2 diabetes mellitus with other diabetic kidney complication: Secondary | ICD-10-CM | POA: Diagnosis not present

## 2014-05-08 DIAGNOSIS — Z992 Dependence on renal dialysis: Secondary | ICD-10-CM | POA: Diagnosis not present

## 2014-05-08 DIAGNOSIS — E1129 Type 2 diabetes mellitus with other diabetic kidney complication: Secondary | ICD-10-CM | POA: Diagnosis not present

## 2014-05-08 DIAGNOSIS — N186 End stage renal disease: Secondary | ICD-10-CM | POA: Diagnosis not present

## 2014-05-09 ENCOUNTER — Other Ambulatory Visit: Payer: Self-pay | Admitting: Family Medicine

## 2014-05-10 NOTE — Telephone Encounter (Signed)
Med filled.  

## 2014-05-11 DIAGNOSIS — D509 Iron deficiency anemia, unspecified: Secondary | ICD-10-CM | POA: Diagnosis not present

## 2014-05-11 DIAGNOSIS — D631 Anemia in chronic kidney disease: Secondary | ICD-10-CM | POA: Diagnosis not present

## 2014-05-11 DIAGNOSIS — N2581 Secondary hyperparathyroidism of renal origin: Secondary | ICD-10-CM | POA: Diagnosis not present

## 2014-05-11 DIAGNOSIS — N186 End stage renal disease: Secondary | ICD-10-CM | POA: Diagnosis not present

## 2014-05-13 ENCOUNTER — Ambulatory Visit (INDEPENDENT_AMBULATORY_CARE_PROVIDER_SITE_OTHER): Payer: Medicare Other | Admitting: Cardiology

## 2014-05-13 ENCOUNTER — Ambulatory Visit: Payer: Medicare Other | Admitting: Cardiology

## 2014-05-13 ENCOUNTER — Encounter: Payer: Self-pay | Admitting: Cardiology

## 2014-05-13 VITALS — BP 116/54 | HR 76 | Ht 65.0 in | Wt 159.4 lb

## 2014-05-13 DIAGNOSIS — I208 Other forms of angina pectoris: Secondary | ICD-10-CM

## 2014-05-13 DIAGNOSIS — I5042 Chronic combined systolic (congestive) and diastolic (congestive) heart failure: Secondary | ICD-10-CM | POA: Diagnosis not present

## 2014-05-13 DIAGNOSIS — E785 Hyperlipidemia, unspecified: Secondary | ICD-10-CM

## 2014-05-13 DIAGNOSIS — I209 Angina pectoris, unspecified: Secondary | ICD-10-CM

## 2014-05-13 DIAGNOSIS — Z789 Other specified health status: Secondary | ICD-10-CM

## 2014-05-13 DIAGNOSIS — I10 Essential (primary) hypertension: Secondary | ICD-10-CM

## 2014-05-13 DIAGNOSIS — I779 Disorder of arteries and arterioles, unspecified: Secondary | ICD-10-CM

## 2014-05-13 DIAGNOSIS — I25708 Atherosclerosis of coronary artery bypass graft(s), unspecified, with other forms of angina pectoris: Secondary | ICD-10-CM

## 2014-05-13 DIAGNOSIS — I739 Peripheral vascular disease, unspecified: Secondary | ICD-10-CM

## 2014-05-13 MED ORDER — ISOSORBIDE MONONITRATE ER 120 MG PO TB24: 120.0000 mg | ORAL_TABLET | Freq: Every day | ORAL | Status: DC

## 2014-05-13 MED ORDER — CILOSTAZOL 100 MG PO TABS
100.0000 mg | ORAL_TABLET | Freq: Two times a day (BID) | ORAL | Status: DC
Start: 2014-05-13 — End: 2015-05-23

## 2014-05-13 NOTE — Patient Instructions (Addendum)
INCREASE  PLETAL TO 100 MG TWICE A DAY.  INCREASE IMDUR (ISOSORBIDE MN) 120 MG  1 TABLET DAILY.  You have been referred to Dr Gwenlyn Found - DISCUSS CAROTID DOPPLER RESULTS  You have been referred to  DR VAN TRIGT- DISCUSS POSSIBLE RE-D0 CABG  Your physician wants you to follow-up in [place]---- 30 MIN APPOINTMENT.  You will receive a reminder letter in the mail two months in advance. If you don't receive a letter, please call our office to schedule the follow-up appointment.

## 2014-05-13 NOTE — Progress Notes (Signed)
PCP: Annye Asa, MD  Clinic Note: Chief Complaint  Patient presents with  . Follow-up    3 month. chest pain, dizzinessno leg pain or swelling. No other concerns  . Coronary Artery Disease  . Chest Pain    Chronic    HPI: [name] [name] is a 74 y.o. male with a PMH below who presents today for second post hospital followup. He has a long-standing history of CAD status post CABG and PCI. Most recently he has had multiple interventions on the RCA. He was asked to be admitted on April 11 and underwent cardiac catheterization following day with PTCA of in-stent restenosis in the triple layers that area of the mid RCA. Initially post PCI he felt quite well for about 2-3 days and is now back to his baseline use of nitroglycerin..  Past Medical History  Diagnosis Date  . Diabetes mellitus   . Hyperlipidemia   . Hypertension   . Gout   . Hypothyroidism     On supplementation  . LBBB (left bundle branch block)     Chronic  . Anemia     Likely secondary to history of GI bleed  . Stroke 1998  . CAD in native artery; and the grafts 1992, 1997, 2002, 2006, 2012    CABG 1992 and redo CABG 2006  . CAD (coronary artery disease) of bypass graft 2006, 2012    In 2006: Occluded SVG-OM noted (SVG-diagonal was previously occluded); 2012: Severe lesion in redo SVG-OM1 --> BMS PCI   . CAD S/P percutaneous coronary angioplasty October 2012; Feb, Apr & Oct 2015; Feb 2016    a) s/p CABG 1992 and redo 2006. b) 10/'12: NSTEMI: PCI to SVG-OM1 Integrity BMS 3.5 x 15 (3.85 mm) c) 2/'15: UA - PCI mid RCA Xience Ap DES 2.75 x 15 (3.1 mm). d) 4/'15 : UA - focal dRCA Resolute DES 2.25 x 8 (2.5 mm). d) 10/'15: PCI to mRCA ISR w/ post stent lesion - Promus P 2.75 x 16 (3.25 mm). e) 2/'16: NSTEMI: Cutting Balloon PTCA of  mRCA ISR (3.5 mm post-dilation).  . Ischemic cardiomyopathy     a) EF 45-50% by echo 2015. b) EF 50% by cath 04/2014.  Marland Kitchen History of Non-ST elevated myocardial infarction (non-STEMI) 1992,  2006, 2012; 02/2013  . Peptic ulcer disease  2004  . BPH (benign prostatic hyperplasia)   . Chronic low back pain   . Carotid arterial disease     a) Duplex 05/2012: R BULB/PROX ICA: 50-69%, L BULB/PROX ICA: 0-49%. ;; 04/29/2014: [id number] RICA, [id number] LICA. Patetn vertebrals,  . Colon cancer 2003    colectomy for CA. no recurrence . never required  radiation or chem  . End stage renal disease on dialysis     Dialyzes at [facility]: Tues/Th/Sat - left upper cavity AV fistula brachiocephalic  . Plavix resistance     a) P2Y12 264 in 02/2014 - put on Brilinta but did not tolerate due to SOB. b) cannot be on Effient due to history of stroke. c) Plavix increased to 75mg  BID and Pletal added 04/2014 due to ISR.    Prior Cardiac Evaluation and Past Surgical History:reviewed in Epic  Interval History: [name] [name] returns today he is still having recurrent angina symptoms. He istaking increasingly higher discussed more frequent doses of nitroglycerin. Dr. Burt Knack if started him on Pletal for possible intentional effect. He certainly is worse on his predialysis days then after dialysis, but really is still having chest pain/angina with minimal  exertion on a daily basis. This has been pretty much limited to just minimal movement around his house. He is not able to get out and do any activities.  In addition to his exertional and resting chest pain he does have dyspnea when the symptoms get bad. He has orthopnea and PND on his off days from dialysis.he doesn't usually get much edema. He denies having any palpitations or rapid/irregular heartbeats. No syncope/near syncope or TIA symptoms he gets symptoms.  No melena, hematochezia, hematuria, or epstaxis. No claudication.  ROS: A comprehensive was performed. Review of Systems  Constitutional: Positive for malaise/fatigue (Very worn-out).  HENT: Negative for nosebleeds.   Respiratory: Positive for shortness of breath (Pretty much constant).   Cardiovascular:  Negative for claudication and leg swelling.  Gastrointestinal: Positive for heartburn and constipation. Negative for blood in stool and melena.  Genitourinary: Negative for hematuria.  Musculoskeletal: Positive for joint pain. Negative for falls.  Neurological: Positive for dizziness (Mostly positional but some vertigo-type symptoms).  Endo/Heme/Allergies: Bruises/bleeds easily.  Psychiatric/Behavioral: Positive for depression (Because he just can't do anything).  All other systems reviewed and are negative.   Current Outpatient Prescriptions on File Prior to Visit  Medication Sig Dispense Refill  . aspirin 81 MG chewable tablet Chew 1 tablet (81 mg total) by mouth daily.    Marland Kitchen atorvastatin (LIPITOR) 40 MG tablet Take 40 mg by mouth at bedtime.    Marland Kitchen Besifloxacin HCl (BESIVANCE) 0.6 % SUSP Place 1 drop into both eyes See admin instructions. Use eye drops 4 times daily for 2 days following injection by Dr. Zigmund Daniel (once a month)    . carvedilol (COREG) 25 MG tablet Take 1 tablet (25 mg total) by mouth 2 (two) times daily with a meal. 180 tablet 1  . clopidogrel (PLAVIX) 75 MG tablet Take 1 tablet (75 mg total) by mouth 2 (two) times daily. 60 tablet 6  . glucose blood (PRODIGY NO CODING BLOOD GLUC) test strip Test as directed 100 each 6  . insulin NPH Human (HUMULIN N,NOVOLIN N) 100 UNIT/ML injection Inject 10-20 Units into the skin 2 (two) times daily. Take 10 units if blood sugar is 130-150.  Take 20 units if blood sugar is >150.    Marland Kitchen levothyroxine (SYNTHROID, LEVOTHROID) 125 MCG tablet TAKE ONE TABLET BY MOUTH ONCE DAILY BEFORE BREAKFAST 90 tablet 1  . loperamide (IMODIUM) 2 MG capsule Take 2 mg by mouth daily as needed for diarrhea or loose stools.     . multivitamin (RENA-VIT) TABS tablet Take 1 tablet by mouth daily. 90 tablet 3  . nitroGLYCERIN (NITROSTAT) 0.4 MG SL tablet Place 1 tablet (0.4 mg total) under the tongue every 5 (five) minutes as needed for chest pain. 100 tablet 4  .  sevelamer carbonate (RENVELA) 800 MG tablet Take 3(three)  800mg  tablets by mouth with every meal.     No current facility-administered medications on file prior to visit.   Allergies  Allergen Reactions  . Brilinta [Ticagrelor]     Shortness of breath  . Shellfish Allergy     Causes gout flare-ups    History  Substance Use Topics  . Smoking status: Never Smoker   . Smokeless tobacco: Never Used  . Alcohol Use: No   Family History  Problem Relation Age of Onset  . Heart disease Mother   . Hypertension Mother   . Diabetes Mother   . Heart disease Father   . Hypertension Father   . Diabetes Father   .  Heart attack Mother   . Heart attack Father   . Stroke Paternal Aunt   . Stroke Paternal Uncle     Wt Readings from Last 3 Encounters:  05/13/14 72.303 kg (159 lb 6.4 oz)  05/03/14 74.447 kg (164 lb 2 oz)  04/28/14 72.576 kg (160 lb)    PHYSICAL EXAM BP 116/54 mmHg  Pulse 76  Ht 5\' 5"  (1.651 m)  Wt 72.303 kg (159 lb 6.4 oz)  BMI 26.53 kg/m2 General appearance: alert, cooperative, appears stated age, no distress; and Borderline obese. Chronically ill-appearing, pale, well groomed.Marland Kitchen answers questions appropriately.  Neck: no adenopathy, no carotid bruit, mild JVD; Supple, symmetrical, trachea midline  Lungs: CTAB, normal percussion bilaterally and Nonlabored, good air movement  Heart: normal apical impulse, RRR, S1, S2 normal, S4 present, SEM 2/6, crescendo, decrescendo and harsh at 2nd right intercostal space, no click and no rub  Abdomen: soft, non-tender; bowel sounds normal; no masses, no organomegaly and Mild truncal obesity  Extremities: extremities normal, atraumatic, no cyanosis or edema and fistula in the left upper arm with good thrill  Pulses: 2+ and symmetric  Neurologic: Grossly normal Pscyh;somewhat depressed mood & affect    Adult ECG Report  Rate: 76 ;  Rhythm: normal sinus rhythm and LBBB with associated wall motion abnormalities and wide QRS.  Otherwise normal axis and intervals.  Narrative Interpretation: no change  Recent Labs:     Checked by Nephrology.  ASSESSMENT / PLAN: Very unfortunate situation with already having redo CABG and both graft conduits to the RCA system have been occluded. He has recurrent in-stent restenosis involving the mid RCA. He is at multiple interventions on the RCA with the stents or to the balloon angioplasty with cutting balloon for support wounds. He is started on options for trying to titrate up his medications for angina as well as polyp was likely ensuing heart failure.  He is being referred to Dr. Dahlia Byes for consideration of re-redo CABG with a graft to the RCA. Unfortunately we have limited options of the RCA. We need to do best we can to try to keep these grafts open.     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 Graft to the RCA is occluded remaining graft to the OM and LIMA are patent. At this current point in time we are limited as percutaneous options because of the continued recurrent in-stent restenosis and there being multiple stents. I would not put a new stent in the because of that, but 3-4 stents with in-stent. We have looked into brachytherapy options but there are no available locations nearby.  Plan: I will increase his cilostazol to 100 mg daily and Imdur to 120 mg daily.  He was intolerant of Ranexa which is very unfortunate. He remains on statin and Plavix despite the fact of having a sub-therapeutic value for P2y12 inbitition.  Could not afford Brilinta.  At this point we don't have that many more options to pursue if he has recurrent stenosis. He has had multiple interventions in the RCA and eventually if left to his own devices when shutdown. This unfortunately couldn't  with the largest effusion coronary blood flow. He probably is not a very acceptable second redo CABG patient's being on office with progressive worsening symptoms of  angina heart failure. Despite this, he is reasonable to refer him back to cardiac surgery for assessment 1 he would like you should be candidate for redo cardiac surgery.      Relevant Medications   isosorbide mononitrate (IMDUR) 120 MG 24 hr tablet   Other Relevant Orders   EKG 12-Lead (Completed)   Ambulatory referral to Cardiothoracic Surgery   CAD (coronary artery disease) of bypass graft - PCI to SVG-OM with BMS; PCI-mid RCA with a Xience DES (Chronic)   Relevant Medications   isosorbide mononitrate (IMDUR) 120 MG 24 hr tablet   Other Relevant Orders   EKG 12-Lead (Completed)   Ambulatory referral to Cardiothoracic Surgery   Carotid arterial disease    In addition to his significant CAD, he does have carotid disease and is looking overall worse. I will referred results to Dr. Gwenlyn Found to his option on +/- Carotid Stent. -- He would not be a good choice for Cudid assistance.      Relevant Medications   isosorbide mononitrate (IMDUR) 120 MG 24 hr tablet   Chronic combined systolic and diastolic congestive heart failure, NYHA class 2 (Chronic)    He is on nitrate, carvedilol and is his hydration status is managed by dialysis.      Relevant Medications   isosorbide mononitrate (IMDUR) 120 MG 24 hr tablet   Other Relevant Orders   EKG 12-Lead (Completed)   Ambulatory referral to Cardiothoracic Surgery   Essential hypertension (Chronic)    Well-controlled on current medications.      Relevant Medications   isosorbide mononitrate (IMDUR) 120 MG 24 hr tablet   Other Relevant Orders   EKG 12-Lead (Completed)   Ambulatory referral to Cardiothoracic Surgery   Hyperlipidemia with target LDL less than 70 (Chronic)   Relevant Medications   isosorbide mononitrate (IMDUR) 120 MG 24 hr tablet   Other Relevant Orders   EKG  12-Lead (Completed)   Ambulatory referral to Cardiothoracic Surgery   Plavix resistance   Relevant Orders   EKG 12-Lead (Completed)   Ambulatory referral to Cardiothoracic Surgery      Orders Placed This Encounter  Procedures  . Ambulatory referral to Cardiothoracic Surgery    Referral Priority:  Routine    Referral Type:  Surgical    Referral Reason:  Specialty Services Required    Referred to Provider:  Ivin Poot, MD    Requested Specialty:  Cardiothoracic Surgery    Number of Visits Requested:  1  . EKG 12-Lead   Meds ordered this encounter  Medications  . cilostazol (PLETAL) 100 MG tablet    Sig: Take 1 tablet (100 mg total) by mouth 2 (two) times daily.    Dispense:  180 tablet    Refill:  3  . isosorbide mononitrate (IMDUR) 120 MG 24 hr tablet    Sig: Take 1 tablet (120 mg total) by mouth daily.    Dispense:  90 tablet    Refill:  3     Followup: 6 months    Gannon Heinzman, Leonie Green, M.D., M.S. Interventional Cardiologist   Pager # [phone]

## 2014-05-15 DIAGNOSIS — N186 End stage renal disease: Secondary | ICD-10-CM | POA: Diagnosis not present

## 2014-05-15 DIAGNOSIS — N2581 Secondary hyperparathyroidism of renal origin: Secondary | ICD-10-CM | POA: Diagnosis not present

## 2014-05-18 ENCOUNTER — Encounter: Payer: Self-pay | Admitting: Cardiology

## 2014-05-18 NOTE — Assessment & Plan Note (Signed)
Constant at least class II and now frequently during class III and 4 and almost unstable angina with angina all involving the mid RCA stented segment. Graft to the RCA is occluded remaining graft to the OM and LIMA are patent. At this current point in time we are limited as percutaneous options because of the continued recurrent in-stent restenosis and there being multiple stents. I would not put a new stent in the because of that, but 3-4 stents with in-stent. We have looked into brachytherapy options but there are no available locations nearby.  Plan: I will increase his cilostazol to 100 mg daily and Imdur to 120 mg daily.  He was intolerant of Ranexa which is very unfortunate. He remains on statin and Plavix despite the fact of having a sub-therapeutic value for P2y12 inbitition.  Could not afford Brilinta.  At this point we don't have that many more options to pursue if he has recurrent stenosis. He has had multiple interventions in the RCA and eventually if left to his own devices when shutdown. This unfortunately couldn't with the largest effusion coronary blood flow. He probably is not a very acceptable second redo CABG patient's being on office with progressive worsening symptoms of angina heart failure. Despite this, he is reasonable to refer him back to cardiac surgery for assessment 1 he would like you should be candidate for redo cardiac surgery.

## 2014-05-18 NOTE — Assessment & Plan Note (Signed)
Well-controlled on current medications 

## 2014-05-18 NOTE — Assessment & Plan Note (Signed)
On statin. Relatively well-controlled.

## 2014-05-18 NOTE — Assessment & Plan Note (Addendum)
In addition to his significant CAD, he does have carotid disease and is looking overall worse. I will referred results to Dr. Gwenlyn Found to his option on +/- Carotid Stent. -- He would not be a good choice for Cudid assistance.

## 2014-05-18 NOTE — Assessment & Plan Note (Signed)
He is on nitrate, carvedilol and is his hydration status is managed by dialysis.

## 2014-05-19 ENCOUNTER — Encounter: Payer: Medicare Other | Admitting: Cardiothoracic Surgery

## 2014-05-20 ENCOUNTER — Encounter: Payer: Medicare Other | Admitting: Cardiothoracic Surgery

## 2014-05-21 ENCOUNTER — Institutional Professional Consult (permissible substitution) (INDEPENDENT_AMBULATORY_CARE_PROVIDER_SITE_OTHER): Payer: Medicare Other | Admitting: Cardiothoracic Surgery

## 2014-05-21 ENCOUNTER — Encounter: Payer: Self-pay | Admitting: Cardiothoracic Surgery

## 2014-05-21 VITALS — BP 142/53 | HR 71 | Resp 16 | Ht 65.0 in | Wt 160.0 lb

## 2014-05-21 DIAGNOSIS — I25119 Atherosclerotic heart disease of native coronary artery with unspecified angina pectoris: Secondary | ICD-10-CM | POA: Diagnosis not present

## 2014-05-21 DIAGNOSIS — N186 End stage renal disease: Secondary | ICD-10-CM

## 2014-05-21 DIAGNOSIS — I5040 Unspecified combined systolic (congestive) and diastolic (congestive) heart failure: Secondary | ICD-10-CM | POA: Diagnosis not present

## 2014-05-21 DIAGNOSIS — I208 Other forms of angina pectoris: Secondary | ICD-10-CM

## 2014-05-21 NOTE — Progress Notes (Signed)
PCP is Annye Asa, MD Referring Provider is Leonie Man, MD  Chief Complaint  Patient presents with  . Coronary Artery Disease    cathed 04/20/14...eval for REDO vs Medical Management  patient examined, most recent cardiac catheterization and echocardiogram imaging personally reviewed  [id number] presents for evaluation and possible third time sternotomy-CABG for severe recurrent coronary disease. The patient had multivessel CABG by Dr. Redmond Pulling approximately 15 years ago including left IMA to LAD. Approximately 10 years ago I did redo CABG x2 with vein  bypass grafts to the OM1 and OM 2. The left IMA was patent at time. The RCA was not diseased that time. Over the ensuing years she is has developed recurrent angina and significant disease the RCA with persistently patent mammary graft to the LAD and persistently patent vein grafts to the OM1 and OM 2. He's had several stents and apparently is not a candidate for further PCI of the RCA disease. He still has preserved LV function. All vein has been harvested from both legs.  Patient is currently on hemodialysis for 3 years and has a AV fistula in his left arm with a non-palpable left radial pulse. The patient has a palpable right radial pulse but nonpalpable right ulnar Pulse.he has no appreciable conduit left for a third time CABG.  The patient is also on hemodialysis Tuesday-30-Saturday. He would be at extremely high risk for third time redo sternotomy and CABG.   Past Medical History  Diagnosis Date  . Diabetes mellitus   . Hyperlipidemia   . Hypertension   . Gout   . Hypothyroidism     On supplementation  . LBBB (left bundle branch block)     Chronic  . Anemia     Likely secondary to history of GI bleed  . Stroke 1998  . CAD in native artery; and the grafts 1992, 1997, 2002, 2006, 2012    CABG 1992 and redo CABG 2006  . CAD (coronary artery disease) of bypass graft 2006, 2012    In 2006: Occluded SVG-OM noted (SVG-diagonal  was previously occluded); 2012: Severe lesion in redo SVG-OM1 --> BMS PCI   . CAD S/P percutaneous coronary angioplasty October 2012; Feb, Apr & Oct 2015; Feb 2016    a) s/p CABG 1992 and redo 2006. b) 10/'12: NSTEMI: PCI to SVG-OM1 Integrity BMS 3.5 x 15 (3.85 mm) c) 2/'15: UA - PCI mid RCA Xience Ap DES 2.75 x 15 (3.1 mm). d) 4/'15 : UA - focal dRCA Resolute DES 2.25 x 8 (2.5 mm). d) 10/'15: PCI to mRCA ISR w/ post stent lesion - Promus P 2.75 x 16 (3.25 mm). e) 2/'16: NSTEMI: Cutting Balloon PTCA of  mRCA ISR (3.5 mm post-dilation).  . Ischemic cardiomyopathy     a) EF 45-50% by echo 2015. b) EF 50% by cath 04/2014.  Marland Kitchen History of Non-ST elevated myocardial infarction (non-STEMI) 1992, 2006, 2012; 02/2013  . Peptic ulcer disease  2004  . BPH (benign prostatic hyperplasia)   . Chronic low back pain   . Carotid arterial disease     a) Duplex 05/2012: R BULB/PROX ICA: 50-69%, L BULB/PROX ICA: 0-49%. ;; 04/29/2014: [id number] RICA, [id number] LICA. Patetn vertebrals,  . Colon cancer 2003    colectomy for CA. no recurrence . never required  radiation or chem  . End stage renal disease on dialysis     Dialyzes at [facility]: Tues/Th/Sat - left upper cavity AV fistula brachiocephalic  . Plavix resistance  a) P2Y12 264 in 02/2014 - put on Brilinta but did not tolerate due to SOB. b) cannot be on Effient due to history of stroke. c) Plavix increased to 75mg  BID and Pletal added 04/2014 due to ISR.    Past Surgical History  Procedure Laterality Date  . Appendectomy    . Cholecystectomy  2009  . Cataract extraction w/ intraocular lens  implant, bilateral Bilateral   . Posterior lumbar fusion    . Av fistula repair Left 05/2009  . Colon resection  2003    transverse and proximal descending w/ primar anastomosis  . Doppler echocardiography  01/25/2012    EF 50 to 55%  . Nm myocar perf wall motion  10/12/2011    EF 54%,LV normal ; no signifiant ischemia  . Cardiac catheterization  10/16/2010    patent   LIMA to the LAD , PATENT  svg TO 2nd marginals ,occluded PLA with collaterals and SVG to the OM  had 90% stenosis  was txlge  bare - metal  3.5  Integrity ten  postdilate  36-37  mm    . Cardiac catheterization  03/22/2004    loss of both SVG,severe disease in prox and ostial  circ not amenable to invention. mod diease distal LAD  AFTER BYPASS GRAFT;-CVTS to evaluate   . Coronary angioplasty  04/03/1995    OM and Circ  . Coronary angioplasty  02/01/2000    [facility]IRC  . Carotid doppler  05/23/2012    ABN CAROTID-- RGT BULB/PROX ICA mild to mod 50-60%;lft bulb/prox mild to mod 0-49%;left subclavian abn waveforms consistent with patients lft arm A/V fistula  . Event monitor  01/23/2012-02/06/2012    SINUS ,LBBB,unifocal PVCs  . Percutaneous coronary stent intervention (pci-s)  02/23/2013    mid RCA 80% & 70% - Xience 2.75 mm x 15 mm ( 3.58mm) ; LIMA-LAD patent, SVG-OM patent (stent patent); SVG-Diag patent.  . Transthoracic echocardiogram  02/23/2013    Moderate concentric hypertrophy. EF 4500%. Septal bounce. Grade 2 diastolic dysfunction (pseudo-normal - severely elevated filling pressures) mild to moderate MR and moderate LA dilation to  . Percutaneous coronary stent intervention (pci-s)  April 2015    dRCA - Integrity Resolute DES   2.25 mm x 60mm (2.5 mm)  . Percutaneous coronary stent intervention (pci-s)  Oct 15 2013    [place] Angina: mRCA ISR with post-stent stenosis -- Cuting PTCA & PCI Promus Premier DES 2.75 mm x 16 mm (3.25 mm)  . Lower extremity arterial dopplers  October 2014    Calcified but non-occlusive peripheral arteries. No evidence of stenosis  . Left heart catheterization with coronary angiogram N/A 02/23/2013    Procedure: LEFT HEART CATHETERIZATION WITH CORONARY ANGIOGRAM;  Surgeon: Lorretta Harp, MD;  Location: [facility] CATH LAB;  Service: Cardiovascular;  Laterality: N/A;  . Left heart catheterization with coronary/graft angiogram N/A 04/30/2013    Procedure: LEFT HEART  CATHETERIZATION WITH Beatrix Fetters;  Surgeon: Leonie Man, MD;  Location: [facility] CATH LAB;  Service: Cardiovascular;  Laterality: N/A;  . Left heart catheterization with coronary/graft angiogram N/A 10/15/2013    Procedure: LEFT HEART CATHETERIZATION WITH Beatrix Fetters;  Surgeon: Leonie Man, MD;  Location: [facility] CATH LAB;  Service: Cardiovascular;  Laterality: N/A;  . Left heart catheterization with coronary/graft angiogram N/A 02/09/2014    Procedure: LEFT HEART CATHETERIZATION WITH Beatrix Fetters;  Surgeon: Leonie Man, MD;  Location: [facility] CATH LAB;  Service: Cardiovascular;  Laterality: N/A;  . Coronary angioplasty  02/09/14  Cutting Balloon PTCA of mRCA ISR 99% - 3.5 mm  . Colon surgery  2003    "cancer"  . Coronary artery bypass graft  08/22/1990    INITIAL:LIMA to LAD, SVG to  Diagonal, SVG to OM  . Coronary artery bypass graft  2006    SVG to OM1,SVG to OM2 with patent LIMA  to LAD and occluded vein  graft to the diagonal  and occluded vein grft to OM from  prior  surgery  . Back surgery    . Left and right heart catheterization with coronary/graft angiogram N/A 04/20/2014    Procedure: LEFT AND RIGHT HEART CATHETERIZATION WITH Beatrix Fetters;  Surgeon: Sherren Mocha, MD;  Location: [facility] CATH LAB;  Service: Cardiovascular;  Laterality: N/A;    Family History  Problem Relation Age of Onset  . Heart disease Mother   . Hypertension Mother   . Diabetes Mother   . Heart disease Father   . Hypertension Father   . Diabetes Father   . Heart attack Mother   . Heart attack Father   . Stroke Paternal Aunt   . Stroke Paternal Uncle     Social History History  Substance Use Topics  . Smoking status: Never Smoker   . Smokeless tobacco: Never Used  . Alcohol Use: No    Current Outpatient Prescriptions  Medication Sig Dispense Refill  . aspirin 81 MG chewable tablet Chew 1 tablet (81 mg total) by mouth daily.    Marland Kitchen atorvastatin (LIPITOR) 40 MG  tablet Take 40 mg by mouth at bedtime.    Marland Kitchen Besifloxacin HCl (BESIVANCE) 0.6 % SUSP Place 1 drop into both eyes See admin instructions. Use eye drops 4 times daily for 2 days following injection by Dr. Zigmund Daniel (once a month)    . carvedilol (COREG) 25 MG tablet Take 1 tablet (25 mg total) by mouth 2 (two) times daily with a meal. 180 tablet 1  . cilostazol (PLETAL) 100 MG tablet Take 1 tablet (100 mg total) by mouth 2 (two) times daily. 180 tablet 3  . clopidogrel (PLAVIX) 75 MG tablet Take 1 tablet (75 mg total) by mouth 2 (two) times daily. 60 tablet 6  . glucose blood (PRODIGY NO CODING BLOOD GLUC) test strip Test as directed 100 each 6  . insulin NPH Human (HUMULIN N,NOVOLIN N) 100 UNIT/ML injection Inject 10-20 Units into the skin 2 (two) times daily. Take 10 units if blood sugar is 130-150.  Take 20 units if blood sugar is >150.    Marland Kitchen isosorbide mononitrate (IMDUR) 120 MG 24 hr tablet Take 1 tablet (120 mg total) by mouth daily. 90 tablet 3  . levothyroxine (SYNTHROID, LEVOTHROID) 125 MCG tablet TAKE ONE TABLET BY MOUTH ONCE DAILY BEFORE BREAKFAST 90 tablet 1  . loperamide (IMODIUM) 2 MG capsule Take 2 mg by mouth daily as needed for diarrhea or loose stools.     . multivitamin (RENA-VIT) TABS tablet Take 1 tablet by mouth daily. 90 tablet 3  . nitroGLYCERIN (NITROSTAT) 0.4 MG SL tablet Place 1 tablet (0.4 mg total) under the tongue every 5 (five) minutes as needed for chest pain. 100 tablet 4  . sevelamer carbonate (RENVELA) 800 MG tablet Take 3(three)  800mg  tablets by mouth with every meal.     No current facility-administered medications for this visit.    Allergies  Allergen Reactions  . Brilinta [Ticagrelor]     Shortness of breath  . Shellfish Allergy     Causes gout flare-ups  Review of Systems   Review of Systems  General:  No weight loss   no fever   no decreased energy  no night sweats Cardiac: + Chest pain with exertion-ng chest pain   --SOB with exertion -   Orthopnea                  -PND  -ankle edema - syncope Pulmonary:  no dyspnea,no cough, no productive cough no home oxygen no hemoptysis GI: no difficulty swallowing  no GERD no jaundice  no melena  no hematemesis no        abdominal pain GU:  no dysuria  no hematuria  no frequent UTI no BPH Vascular:  no claudication  No TIA +varicose veins no DVT Neuro:  no sroke no seizures no TIA no head trauma no vision changes Musculoskeletal:  normal mobility no arthritis  no gout  no joint swelling Skin: no rash  no skin ulceration  no skin cancer Endocrine: diabetes  no thyroid didease Hematologic: + easy bruising  no blood transfusions  no frequent epistaxis ENT : no painful teeth no dentures no loose teeth Psych : no anxiety  no depression  o psych hospitalizations        BP 142/53 mmHg  Pulse 71  Resp 16  Ht 5\' 5"  (1.651 m)  Wt 160 lb (72.576 kg)  BMI 26.63 kg/m2  SpO2 98% Physical Exam      Physical Exam  General: elderly Caucasian male chronically ill but in no acute distress accompanied by his wife HEENT: Normocephalic pupils equal , dentition adequate Neck: Supple without JVD, adenopathy, or bruit Chest: Clear to auscultation, symmetrical breath sounds, no rhonchi, no tenderness             or deformity-well-healed sternotomy scars x2 Cardiovascular: Regular rate and rhythm, no murmur, no gallop, peripheral pulses not palpable in either lower extremity, right radial artery pulse palpable only              Abdomen:  Soft, nontender, no palpable mass or organomegaly, previous surgical scars Extremities: Warm, well-perfused, no clubbing cyanosis edema or tenderness,              no venous stasis changes of the legs, positive air closed veins of left leg, 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 He does not have any appreciable vessel conduit left in either the lower or upper cavities He would be an extremely high-risk because of his hemodialysis-dependent renal failure  Plan  continue medical care:   Len Childs, MD [facility] [phone]

## 2014-05-23 ENCOUNTER — Other Ambulatory Visit: Payer: Self-pay | Admitting: Cardiology

## 2014-05-28 ENCOUNTER — Encounter (INDEPENDENT_AMBULATORY_CARE_PROVIDER_SITE_OTHER): Payer: Medicare Other | Admitting: Ophthalmology

## 2014-05-28 DIAGNOSIS — E11321 Type 2 diabetes mellitus with mild nonproliferative diabetic retinopathy with macular edema: Secondary | ICD-10-CM

## 2014-05-28 DIAGNOSIS — I10 Essential (primary) hypertension: Secondary | ICD-10-CM | POA: Diagnosis not present

## 2014-05-28 DIAGNOSIS — E11331 Type 2 diabetes mellitus with moderate nonproliferative diabetic retinopathy with macular edema: Secondary | ICD-10-CM

## 2014-05-28 DIAGNOSIS — E11311 Type 2 diabetes mellitus with unspecified diabetic retinopathy with macular edema: Secondary | ICD-10-CM

## 2014-05-28 DIAGNOSIS — H43813 Vitreous degeneration, bilateral: Secondary | ICD-10-CM | POA: Diagnosis not present

## 2014-05-28 DIAGNOSIS — H35033 Hypertensive retinopathy, bilateral: Secondary | ICD-10-CM

## 2014-06-02 ENCOUNTER — Ambulatory Visit (INDEPENDENT_AMBULATORY_CARE_PROVIDER_SITE_OTHER): Payer: Medicare Other | Admitting: Cardiovascular Disease

## 2014-06-02 ENCOUNTER — Encounter: Payer: Self-pay | Admitting: Cardiovascular Disease

## 2014-06-02 VITALS — BP 122/52 | HR 72 | Ht 65.0 in | Wt 160.1 lb

## 2014-06-02 DIAGNOSIS — I6529 Occlusion and stenosis of unspecified carotid artery: Secondary | ICD-10-CM | POA: Diagnosis not present

## 2014-06-02 DIAGNOSIS — I208 Other forms of angina pectoris: Secondary | ICD-10-CM

## 2014-06-02 NOTE — Progress Notes (Signed)
06/02/2014 [name]   1940/06/26  [id number]  Primary Physician Annye Asa, MD Primary Cardiologist: Lorretta Harp MD Renae Gloss   HPI:  Mr. Doll is a 74 year old married Caucasian male who is accompanied by his wife today. He is a patient of Dr. Shanon Brow Harding's. His past history is remarkable for coronary artery disease status post remote CABG in 1992 with redo in 2006. He had multiple interventions since most recently in April with Dr. Burt Knack we dilated his native RCA for "in-stent restenosis in the setting of ACS. In addition, he has a history of diabetes, hypertension, hyperlipidemia and ischemic cardiopathy with an EF in the 50% range. He has end-stage renal disease on hemodialysis. He apparently is Plavix resistant with elevated when necessary he'll currently on twice a day Plavix. He was referred to me because of moderate carotid disease which we have been following the duplex ultrasound. He is neurologically symptomatically. Dopplers performed last month revealed moderate right and mild to moderate left ICA stenosis.   Current Outpatient Prescriptions  Medication Sig Dispense Refill  . aspirin 81 MG chewable tablet Chew 1 tablet (81 mg total) by mouth daily.    Marland Kitchen atorvastatin (LIPITOR) 40 MG tablet Take 40 mg by mouth at bedtime.    Marland Kitchen Besifloxacin HCl (BESIVANCE) 0.6 % SUSP Place 1 drop into both eyes See admin instructions. Use eye drops 4 times daily for 2 days following injection by Dr. Zigmund Daniel (once a month)    . carvedilol (COREG) 25 MG tablet Take 1 tablet (25 mg total) by mouth 2 (two) times daily with a meal. 180 tablet 1  . cilostazol (PLETAL) 100 MG tablet Take 1 tablet (100 mg total) by mouth 2 (two) times daily. 180 tablet 3  . clopidogrel (PLAVIX) 75 MG tablet Take 1 tablet (75 mg total) by mouth 2 (two) times daily. 60 tablet 6  . glucose blood (PRODIGY NO CODING BLOOD GLUC) test strip Test as directed 100 each 6  . insulin NPH Human  (HUMULIN N,NOVOLIN N) 100 UNIT/ML injection Inject 10-20 Units into the skin 2 (two) times daily. Take 10 units if blood sugar is 130-150.  Take 20 units if blood sugar is >150.    Marland Kitchen isosorbide mononitrate (IMDUR) 120 MG 24 hr tablet Take 1 tablet (120 mg total) by mouth daily. 90 tablet 3  . levothyroxine (SYNTHROID, LEVOTHROID) 125 MCG tablet TAKE ONE TABLET BY MOUTH ONCE DAILY BEFORE BREAKFAST 90 tablet 1  . loperamide (IMODIUM) 2 MG capsule Take 2 mg by mouth daily as needed for diarrhea or loose stools.     . multivitamin (RENA-VIT) TABS tablet Take 1 tablet by mouth daily. 90 tablet 3  . nitroGLYCERIN (NITROSTAT) 0.4 MG SL tablet Place 1 tablet (0.4 mg total) under the tongue every 5 (five) minutes as needed for chest pain. 100 tablet 4  . sevelamer carbonate (RENVELA) 800 MG tablet Take 3(three)  800mg  tablets by mouth with every meal.     No current facility-administered medications for this visit.    Allergies  Allergen Reactions  . Brilinta [Ticagrelor]     Shortness of breath  . Shellfish Allergy     Causes gout flare-ups    History   Social History  . Marital Status: Married    Spouse Name: N/A  . Number of Children: 3  . Years of Education: N/A   Occupational History  . Not on file.   Social History Main Topics  . Smoking status:  Never Smoker   . Smokeless tobacco: Never Used  . Alcohol Use: No  . Drug Use: No  . Sexual Activity: No   Other Topics Concern  . Not on file   Social History Narrative   Long-term patient of Dr. Rollene Fare.   Married father of 32, grandfather 52.   Never smoked.   Not exercising D2 hip and back pain & now Angina     Review of Systems: General: negative for chills, fever, night sweats or weight changes.  Cardiovascular: negative for chest pain, dyspnea on exertion, edema, orthopnea, palpitations, paroxysmal nocturnal dyspnea or shortness of breath Dermatological: negative for rash Respiratory: negative for cough or  wheezing Urologic: negative for hematuria Abdominal: negative for nausea, vomiting, diarrhea, bright red blood per rectum, melena, or hematemesis Neurologic: negative for visual changes, syncope, or dizziness All other systems reviewed and are otherwise negative except as noted above.    Blood pressure 122/52, pulse 72, height 5\' 5"  (1.651 m), weight 160 lb 1.6 oz (72.621 kg).  General appearance: alert and no distress Neck: no adenopathy, no JVD, supple, symmetrical, trachea midline, thyroid not enlarged, symmetric, no tenderness/mass/nodules and soft right carotid bruit Lungs: clear to auscultation bilaterally Heart: regular rate and rhythm, S1, S2 normal, no murmur, click, rub or gallop Extremities: extremities normal, atraumatic, no cyanosis or edema  EKG not performed today  ASSESSMENT AND PLAN:   Carotid arterial disease Mr. Blatz is a 74 year old married Caucasian male with a history of coronary artery disease and carotid artery disease. He is referred to me by Dr. Ellyn Hack for evaluation of his carotid artery disease which we have been following by duplex ultrasound. He is neurologically dramatic. He presented to have a stroke back in 1998 with no neurologic residual. Recent Dopplers performed 04/28/14 revealed moderate right and mild to moderate left ICA stenosis. We will continue to follow him at the present ultrasound on an annual basis. I reassured him that at this point we will need to wait for his carotid artery disease to be equal to greater than 80% before considering a revascularization strategy.       Lorretta Harp MD FACP,FACC,FAHA, [facility] 06/02/2014 10:06 AM

## 2014-06-02 NOTE — Patient Instructions (Signed)
Follow up with Dr Gwenlyn Found as needed.   We will contact you for a carotid doppler next April 2017.

## 2014-06-02 NOTE — Assessment & Plan Note (Signed)
[name] [name] is a 74 year old married Caucasian male with a history of coronary artery disease and carotid artery disease. He is referred to me by Dr. Ellyn Hack for evaluation of his carotid artery disease which we have been following by duplex ultrasound. He is neurologically dramatic. He presented to have a stroke back in 1998 with no neurologic residual. Recent Dopplers performed 04/28/14 revealed moderate right and mild to moderate left ICA stenosis. We will continue to follow him at the present ultrasound on an annual basis. I reassured him that at this point we will need to wait for his carotid artery disease to be equal to greater than 80% before considering a revascularization strategy.

## 2014-06-05 DIAGNOSIS — N186 End stage renal disease: Secondary | ICD-10-CM | POA: Diagnosis not present

## 2014-06-05 DIAGNOSIS — N2581 Secondary hyperparathyroidism of renal origin: Secondary | ICD-10-CM | POA: Diagnosis not present

## 2014-06-08 DIAGNOSIS — Z992 Dependence on renal dialysis: Secondary | ICD-10-CM | POA: Diagnosis not present

## 2014-06-08 DIAGNOSIS — E1129 Type 2 diabetes mellitus with other diabetic kidney complication: Secondary | ICD-10-CM | POA: Diagnosis not present

## 2014-06-08 DIAGNOSIS — N186 End stage renal disease: Secondary | ICD-10-CM | POA: Diagnosis not present

## 2014-06-10 DIAGNOSIS — E1129 Type 2 diabetes mellitus with other diabetic kidney complication: Secondary | ICD-10-CM | POA: Diagnosis not present

## 2014-06-10 DIAGNOSIS — N2581 Secondary hyperparathyroidism of renal origin: Secondary | ICD-10-CM | POA: Diagnosis not present

## 2014-06-10 DIAGNOSIS — N186 End stage renal disease: Secondary | ICD-10-CM | POA: Diagnosis not present

## 2014-06-23 ENCOUNTER — Ambulatory Visit: Payer: Medicare Other | Admitting: Family Medicine

## 2014-06-24 DIAGNOSIS — L570 Actinic keratosis: Secondary | ICD-10-CM | POA: Diagnosis not present

## 2014-06-24 DIAGNOSIS — L814 Other melanin hyperpigmentation: Secondary | ICD-10-CM | POA: Diagnosis not present

## 2014-06-24 DIAGNOSIS — D225 Melanocytic nevi of trunk: Secondary | ICD-10-CM | POA: Diagnosis not present

## 2014-06-24 DIAGNOSIS — L821 Other seborrheic keratosis: Secondary | ICD-10-CM | POA: Diagnosis not present

## 2014-06-24 DIAGNOSIS — D2272 Melanocytic nevi of left lower limb, including hip: Secondary | ICD-10-CM | POA: Diagnosis not present

## 2014-06-25 ENCOUNTER — Telehealth: Payer: Self-pay | Admitting: Cardiology

## 2014-06-25 NOTE — Telephone Encounter (Signed)
Pt reports diarrhea every day. Stated ongoing x1 month or so. Notes meds were changed recently, his Plavix and Pletal doses were increased - wanted to investigate this as possible cause.  He is taking immodium for relief - this has helped. Notes he is on dialysis, had inquired about concerns to nephrologist, was advised to ask [place].  Patient denies blood in the stool, denies fatigue, SOB, unusual symptoms.  Routing to Dr. Ellyn Hack to advise.

## 2014-06-25 NOTE — Telephone Encounter (Signed)
Pt says he is having diarrhea,wonder if the blood thinner might be causing it.

## 2014-06-28 NOTE — Telephone Encounter (Signed)
Unlikely to be related to Pletal or Plavix.    [place]

## 2014-06-29 NOTE — Telephone Encounter (Signed)
Spoke with pt wife, aware dr harding did not feel related to medications. Referred patient to medical md.

## 2014-06-30 ENCOUNTER — Ambulatory Visit: Payer: Medicare Other | Admitting: Family Medicine

## 2014-07-01 ENCOUNTER — Telehealth: Payer: Self-pay | Admitting: Family Medicine

## 2014-07-01 ENCOUNTER — Encounter: Payer: Self-pay | Admitting: Family Medicine

## 2014-07-01 ENCOUNTER — Ambulatory Visit (INDEPENDENT_AMBULATORY_CARE_PROVIDER_SITE_OTHER): Payer: Medicare Other | Admitting: Family Medicine

## 2014-07-01 VITALS — BP 144/60 | HR 84 | Temp 98.1°F | Resp 16 | Wt 156.0 lb

## 2014-07-01 DIAGNOSIS — R197 Diarrhea, unspecified: Secondary | ICD-10-CM | POA: Diagnosis not present

## 2014-07-01 DIAGNOSIS — I208 Other forms of angina pectoris: Secondary | ICD-10-CM

## 2014-07-01 NOTE — Progress Notes (Signed)
   Subjective:    Patient ID: [name] [name], male    DOB: 1940/05/27, 74 y.o.   MRN: [id number]  HPI Diarrhea- pt reports sxs started ~3 weeks.  No fevers.  No abd pain.  Stools are 3-7x/day- 'it's just water'.  Stools dependent on intake- 'depends how much i eat or drink', 'whatever I put in, it comes right out'.  No recent abx.  Last hospitalization was April.  No household contacts w/ diarrhea.  No relief w/ Immodium.  Pt reports he has had this problem for years but this is worse than previous b/c it is not responding to the Immodium.   Review of Systems For ROS see HPI     Objective:   Physical Exam  Constitutional: He is oriented to person, place, and time. He appears well-developed and well-nourished. No distress.  HENT:  Head: Normocephalic and atraumatic.  Neck: Neck supple.  Cardiovascular: Normal rate, regular rhythm, normal heart sounds and intact distal pulses.   Pulmonary/Chest: Effort normal and breath sounds normal. No respiratory distress. He has no wheezes. He has no rales.  Abdominal: Soft. Bowel sounds are normal. He exhibits no distension. There is no tenderness. There is no rebound and no guarding.  Musculoskeletal: He exhibits no edema.  Lymphadenopathy:    He has no cervical adenopathy.  Neurological: He is alert and oriented to person, place, and time.  Skin: Skin is warm and dry.  Psychiatric: He has a normal mood and affect. His behavior is normal.  Vitals reviewed.         Assessment & Plan:

## 2014-07-01 NOTE — Progress Notes (Signed)
Pre visit review using our clinic review tool, if applicable. No additional management support is needed unless otherwise documented below in the visit note. 

## 2014-07-01 NOTE — Telephone Encounter (Signed)
Pre visit letter mailed 06/30/14

## 2014-07-01 NOTE — Patient Instructions (Signed)
Follow up as needed Return the stool studies as directed We'll notify you of your lab results and make any changes if needed HANG IN THERE!!!

## 2014-07-02 ENCOUNTER — Other Ambulatory Visit: Payer: Self-pay | Admitting: Family Medicine

## 2014-07-02 ENCOUNTER — Telehealth: Payer: Self-pay

## 2014-07-02 DIAGNOSIS — R197 Diarrhea, unspecified: Secondary | ICD-10-CM | POA: Diagnosis not present

## 2014-07-02 LAB — CBC WITH DIFFERENTIAL/PLATELET
BASOS PCT: 0.3 % (ref 0.0–3.0)
Basophils Absolute: 0 10*3/uL (ref 0.0–0.1)
EOS ABS: 0.3 10*3/uL (ref 0.0–0.7)
EOS PCT: 3.7 % (ref 0.0–5.0)
HCT: 38.5 % — ABNORMAL LOW (ref 39.0–52.0)
Hemoglobin: 13.1 g/dL (ref 13.0–17.0)
LYMPHS PCT: 9.7 % — AB (ref 12.0–46.0)
Lymphs Abs: 0.9 10*3/uL (ref 0.7–4.0)
MCHC: 34.1 g/dL (ref 30.0–36.0)
MCV: 92.2 fl (ref 78.0–100.0)
MONO ABS: 0.8 10*3/uL (ref 0.1–1.0)
Monocytes Relative: 9 % (ref 3.0–12.0)
Neutro Abs: 6.9 10*3/uL (ref 1.4–7.7)
Neutrophils Relative %: 77.3 % — ABNORMAL HIGH (ref 43.0–77.0)
Platelets: 258 10*3/uL (ref 150.0–400.0)
RBC: 4.17 Mil/uL — ABNORMAL LOW (ref 4.22–5.81)
RDW: 14.9 % (ref 11.5–15.5)
WBC: 9 10*3/uL (ref 4.0–10.5)

## 2014-07-02 LAB — BASIC METABOLIC PANEL
BUN: 14 mg/dL (ref 6–23)
CHLORIDE: 94 meq/L — AB (ref 96–112)
CO2: 36 meq/L — AB (ref 19–32)
Calcium: 9.6 mg/dL (ref 8.4–10.5)
Creatinine, Ser: 3.08 mg/dL — ABNORMAL HIGH (ref 0.40–1.50)
GFR: 21.25 mL/min — ABNORMAL LOW (ref >60.00)
Glucose, Bld: 118 mg/dL — ABNORMAL HIGH (ref 70–99)
Potassium: 2.8 mEq/L — CL (ref 3.5–5.1)
Sodium: 138 mEq/L (ref 135–145)

## 2014-07-02 LAB — HEPATIC FUNCTION PANEL
ALBUMIN: 4.3 g/dL (ref 3.5–5.2)
ALK PHOS: 87 U/L (ref 39–117)
ALT: 13 U/L (ref 0–53)
AST: 19 U/L (ref 0–37)
BILIRUBIN DIRECT: 0.1 mg/dL (ref 0.0–0.3)
Total Bilirubin: 0.5 mg/dL (ref 0.2–1.2)
Total Protein: 7.2 g/dL (ref 6.0–8.3)

## 2014-07-02 NOTE — Telephone Encounter (Signed)
Hope with [facility] called to report critical K+ at 2.8. Hand delivered to PCP.

## 2014-07-03 LAB — C. DIFFICILE GDH AND TOXIN A/B
C. difficile GDH: NOT DETECTED
C. difficile Toxin A/B: NOT DETECTED

## 2014-07-04 NOTE — Assessment & Plan Note (Signed)
New to provider, recurrent issue for pt.  Given recent hospitalizations, must r/o C Diff.  Discussed starting Lomotil to slow stools but pt doesn't want to do this if possible C Diff.  Check electrolytes as pt may be having excessive K+ losses and may need adjustments in his HD.  No one at home w/ similar sxs making infectious less likely but not impossible.  Will follow.

## 2014-07-06 ENCOUNTER — Encounter: Payer: Self-pay | Admitting: Family Medicine

## 2014-07-06 DIAGNOSIS — R197 Diarrhea, unspecified: Secondary | ICD-10-CM

## 2014-07-06 LAB — STOOL CULTURE

## 2014-07-07 ENCOUNTER — Encounter: Payer: Self-pay | Admitting: Physician Assistant

## 2014-07-07 MED ORDER — DIPHENOXYLATE-ATROPINE 2.5-0.025 MG PO TABS: 1.0000 | ORAL_TABLET | Freq: Three times a day (TID) | ORAL | Status: DC | PRN

## 2014-07-07 NOTE — Telephone Encounter (Signed)
Med filled and referral placed.

## 2014-07-08 DIAGNOSIS — E1129 Type 2 diabetes mellitus with other diabetic kidney complication: Secondary | ICD-10-CM | POA: Diagnosis not present

## 2014-07-08 DIAGNOSIS — N186 End stage renal disease: Secondary | ICD-10-CM | POA: Diagnosis not present

## 2014-07-08 DIAGNOSIS — Z992 Dependence on renal dialysis: Secondary | ICD-10-CM | POA: Diagnosis not present

## 2014-07-10 DIAGNOSIS — N2581 Secondary hyperparathyroidism of renal origin: Secondary | ICD-10-CM | POA: Diagnosis not present

## 2014-07-10 DIAGNOSIS — N186 End stage renal disease: Secondary | ICD-10-CM | POA: Diagnosis not present

## 2014-07-13 DIAGNOSIS — N2581 Secondary hyperparathyroidism of renal origin: Secondary | ICD-10-CM | POA: Diagnosis not present

## 2014-07-13 DIAGNOSIS — N186 End stage renal disease: Secondary | ICD-10-CM | POA: Diagnosis not present

## 2014-07-15 DIAGNOSIS — N186 End stage renal disease: Secondary | ICD-10-CM | POA: Diagnosis not present

## 2014-07-15 DIAGNOSIS — N2581 Secondary hyperparathyroidism of renal origin: Secondary | ICD-10-CM | POA: Diagnosis not present

## 2014-07-15 DIAGNOSIS — E1129 Type 2 diabetes mellitus with other diabetic kidney complication: Secondary | ICD-10-CM | POA: Diagnosis not present

## 2014-07-15 DIAGNOSIS — D631 Anemia in chronic kidney disease: Secondary | ICD-10-CM | POA: Diagnosis not present

## 2014-07-17 DIAGNOSIS — E1129 Type 2 diabetes mellitus with other diabetic kidney complication: Secondary | ICD-10-CM | POA: Diagnosis not present

## 2014-07-17 DIAGNOSIS — N186 End stage renal disease: Secondary | ICD-10-CM | POA: Diagnosis not present

## 2014-07-17 DIAGNOSIS — N2581 Secondary hyperparathyroidism of renal origin: Secondary | ICD-10-CM | POA: Diagnosis not present

## 2014-07-17 DIAGNOSIS — D631 Anemia in chronic kidney disease: Secondary | ICD-10-CM | POA: Diagnosis not present

## 2014-07-20 ENCOUNTER — Telehealth: Payer: Self-pay | Admitting: Behavioral Health

## 2014-07-20 ENCOUNTER — Encounter: Payer: Self-pay | Admitting: Behavioral Health

## 2014-07-20 DIAGNOSIS — D631 Anemia in chronic kidney disease: Secondary | ICD-10-CM | POA: Diagnosis not present

## 2014-07-20 DIAGNOSIS — N2581 Secondary hyperparathyroidism of renal origin: Secondary | ICD-10-CM | POA: Diagnosis not present

## 2014-07-20 DIAGNOSIS — E1129 Type 2 diabetes mellitus with other diabetic kidney complication: Secondary | ICD-10-CM | POA: Diagnosis not present

## 2014-07-20 DIAGNOSIS — N186 End stage renal disease: Secondary | ICD-10-CM | POA: Diagnosis not present

## 2014-07-20 NOTE — Telephone Encounter (Signed)
Pre-Visit Call completed with patient and chart updated.   Pre-Visit Info documented in Specialty Comments under [employer].    

## 2014-07-21 ENCOUNTER — Encounter: Payer: Self-pay | Admitting: Family Medicine

## 2014-07-21 ENCOUNTER — Ambulatory Visit (INDEPENDENT_AMBULATORY_CARE_PROVIDER_SITE_OTHER): Payer: Medicare Other | Admitting: Family Medicine

## 2014-07-21 VITALS — BP 130/76 | HR 73 | Temp 97.9°F | Resp 16 | Ht 65.0 in | Wt 157.2 lb

## 2014-07-21 DIAGNOSIS — E1140 Type 2 diabetes mellitus with diabetic neuropathy, unspecified: Secondary | ICD-10-CM

## 2014-07-21 DIAGNOSIS — E785 Hyperlipidemia, unspecified: Secondary | ICD-10-CM | POA: Diagnosis not present

## 2014-07-21 DIAGNOSIS — N186 End stage renal disease: Secondary | ICD-10-CM | POA: Diagnosis not present

## 2014-07-21 DIAGNOSIS — N400 Enlarged prostate without lower urinary tract symptoms: Secondary | ICD-10-CM

## 2014-07-21 DIAGNOSIS — Z125 Encounter for screening for malignant neoplasm of prostate: Secondary | ICD-10-CM

## 2014-07-21 DIAGNOSIS — Z23 Encounter for immunization: Secondary | ICD-10-CM

## 2014-07-21 DIAGNOSIS — E038 Other specified hypothyroidism: Secondary | ICD-10-CM | POA: Diagnosis not present

## 2014-07-21 DIAGNOSIS — I10 Essential (primary) hypertension: Secondary | ICD-10-CM

## 2014-07-21 DIAGNOSIS — Z0000 Encounter for general adult medical examination without abnormal findings: Secondary | ICD-10-CM | POA: Diagnosis not present

## 2014-07-21 DIAGNOSIS — I208 Other forms of angina pectoris: Secondary | ICD-10-CM

## 2014-07-21 DIAGNOSIS — I25708 Atherosclerosis of coronary artery bypass graft(s), unspecified, with other forms of angina pectoris: Secondary | ICD-10-CM

## 2014-07-21 LAB — HEPATIC FUNCTION PANEL
ALK PHOS: 78 U/L (ref 39–117)
ALT: 13 U/L (ref 0–53)
AST: 12 U/L (ref 0–37)
Albumin: 4.1 g/dL (ref 3.5–5.2)
BILIRUBIN DIRECT: 0.2 mg/dL (ref 0.0–0.3)
TOTAL PROTEIN: 6.5 g/dL (ref 6.0–8.3)
Total Bilirubin: 0.6 mg/dL (ref 0.2–1.2)

## 2014-07-21 LAB — LIPID PANEL
CHOL/HDL RATIO: 2
CHOLESTEROL: 71 mg/dL (ref 0–200)
HDL: 28.7 mg/dL — ABNORMAL LOW (ref >39.00)
LDL CALC: 22 mg/dL (ref 0–99)
NonHDL: 42.3
TRIGLYCERIDES: 104 mg/dL (ref 0.0–149.0)
VLDL: 20.8 mg/dL (ref 0.0–40.0)

## 2014-07-21 LAB — BASIC METABOLIC PANEL
BUN: 31 mg/dL — AB (ref 6–23)
CALCIUM: 9.7 mg/dL (ref 8.4–10.5)
CHLORIDE: 101 meq/L (ref 96–112)
CO2: 28 mEq/L (ref 19–32)
CREATININE: 4.49 mg/dL — AB (ref 0.40–1.50)
GFR: 13.75 mL/min — AB (ref >60.00)
Glucose, Bld: 141 mg/dL — ABNORMAL HIGH (ref 70–99)
POTASSIUM: 4.4 meq/L (ref 3.5–5.1)
Sodium: 139 mEq/L (ref 135–145)

## 2014-07-21 LAB — CBC WITH DIFFERENTIAL/PLATELET
BASOS ABS: 0 10*3/uL (ref 0.0–0.1)
Basophils Relative: 0.2 % (ref 0.0–3.0)
EOS ABS: 0.3 10*3/uL (ref 0.0–0.7)
EOS PCT: 4.5 % (ref 0.0–5.0)
HEMATOCRIT: 34 % — AB (ref 39.0–52.0)
Hemoglobin: 11.1 g/dL — ABNORMAL LOW (ref 13.0–17.0)
LYMPHS ABS: 0.7 10*3/uL (ref 0.7–4.0)
Lymphocytes Relative: 8.8 % — ABNORMAL LOW (ref 12.0–46.0)
MCHC: 32.8 g/dL (ref 30.0–36.0)
MCV: 92.2 fl (ref 78.0–100.0)
MONO ABS: 0.6 10*3/uL (ref 0.1–1.0)
MONOS PCT: 7.8 % (ref 3.0–12.0)
NEUTROS ABS: 6 10*3/uL (ref 1.4–7.7)
NEUTROS PCT: 78.7 % — AB (ref 43.0–77.0)
Platelets: 261 10*3/uL (ref 150.0–400.0)
RBC: 3.69 Mil/uL — ABNORMAL LOW (ref 4.22–5.81)
RDW: 15 % (ref 11.5–15.5)
WBC: 7.7 10*3/uL (ref 4.0–10.5)

## 2014-07-21 LAB — PSA: PSA: 0.62 ng/mL (ref 0.10–4.00)

## 2014-07-21 LAB — TSH: TSH: 0.59 u[IU]/mL (ref 0.35–4.50)

## 2014-07-21 LAB — HEMOGLOBIN A1C: Hgb A1c MFr Bld: 6 % (ref 4.6–6.5)

## 2014-07-21 MED ORDER — ATORVASTATIN CALCIUM 40 MG PO TABS: 40.0000 mg | ORAL_TABLET | Freq: Every day | ORAL | Status: DC

## 2014-07-21 NOTE — Progress Notes (Signed)
   Subjective:    Patient ID: [name] [name], male    DOB: August 09, 1940, 74 y.o.   MRN: [id number]  HPI Here today for CPE.  Risk Factors: HTN- chronic problem, on Coreg.  + CP.  No SOB, HAs, visual changes, edema Hyperlipidemia- chronic problem, on Lipitor daily.  Denies abd pain, N/V.  + diarrhea Hypothyroid- chronic problem, on Levothyroxine DM- chronic problem, on SSI based on sugars.  UTD on eye exam. CKD- on HD T/Thurs/Sat CAD- chronic problem, following w/ Dr Ellyn Hack.  Pt has critical disease and is running out of options as he has had multiple stents.  + CP Physical Activity: limited due to HD and CAD Fall Risk: increased Depression: denies current sxs Hearing: normal to conversational tones, decreased to whispered voice ADL's: independent Cognitive: normal linear thought process, memory and attention intact Home Safety: safe at home, lives w/ wife Height, Weight, BMI, Visual Acuity: see vitals, vision corrected to 20/20 w/ glasses Counseling: overdue on colonoscopy- has GI appt on Friday Labs Ordered: See A&P Care Plan: See A&P    Review of Systems Patient reports no vision/hearing changes, anorexia, fever ,adenopathy, persistant/recurrent hoarseness, palpitations, edema, persistant/recurrent cough, hemoptysis, dyspnea (rest,exertional, paroxysmal nocturnal), gastrointestinal  bleeding (melena, rectal bleeding), abdominal pain, excessive heart burn, GU symptoms (dysuria, hematuria, voiding/incontinence issues) syncope, focal weakness, memory loss, skin/hair/nail changes, depression, anxiety, abnormal bruising/bleeding, musculoskeletal symptoms/signs.   + recurring CP + dysphagia w/ solid food- no difficulty w/ liquids + L hand numbness- pt feels this is due to fistula    Objective:   Physical Exam General Appearance:    Alert, cooperative, no distress, appears stated age  Head:    Normocephalic, without obvious abnormality, atraumatic  Eyes:    PERRL, conjunctiva/corneas  clear, EOM's intact, fundi    benign, both eyes       Ears:    Normal TM's and external ear canals, both ears  Nose:   Nares normal, septum midline, mucosa normal, no drainage   or sinus tenderness  Throat:   Lips, mucosa, and tongue normal; teeth and gums normal  Neck:   Supple, symmetrical, trachea midline, no adenopathy;       thyroid:  No enlargement/tenderness/nodules  Back:     Symmetric, no curvature, ROM normal, no CVA tenderness  Lungs:     Clear to auscultation bilaterally, respirations unlabored  Chest wall:    No tenderness or deformity  Heart:    Regular rate and rhythm, S1 and S2 normal, I-II/VI SEM  Abdomen:     Soft, non-tender, bowel sounds active all four quadrants,    no masses, no organomegaly  Genitalia:    Deferred   Rectal:    Extremities:   Extremities normal, atraumatic, no cyanosis or edema  Pulses:   2+ and symmetric all extremities  Skin:   Skin color, texture, turgor normal, no rashes or lesions  Lymph nodes:   Cervical, supraclavicular, and axillary nodes normal  Neurologic:   CNII-XII intact. Normal strength, sensation and reflexes      throughout          Assessment & Plan:

## 2014-07-21 NOTE — Progress Notes (Signed)
Pre visit review using our clinic review tool, if applicable. No additional management support is needed unless otherwise documented below in the visit note. 

## 2014-07-21 NOTE — Patient Instructions (Signed)
Follow up in 3-4 months to recheck diabetes We'll notify you of your lab results and make any changes if needed Keep up the good work!  You look great! Call with any questions or concerns Have a great summer! [facility]!!

## 2014-07-22 DIAGNOSIS — D631 Anemia in chronic kidney disease: Secondary | ICD-10-CM | POA: Diagnosis not present

## 2014-07-22 DIAGNOSIS — N2581 Secondary hyperparathyroidism of renal origin: Secondary | ICD-10-CM | POA: Diagnosis not present

## 2014-07-22 DIAGNOSIS — N186 End stage renal disease: Secondary | ICD-10-CM | POA: Diagnosis not present

## 2014-07-22 DIAGNOSIS — E1129 Type 2 diabetes mellitus with other diabetic kidney complication: Secondary | ICD-10-CM | POA: Diagnosis not present

## 2014-07-23 ENCOUNTER — Ambulatory Visit (INDEPENDENT_AMBULATORY_CARE_PROVIDER_SITE_OTHER): Payer: Medicare Other | Admitting: Physician Assistant

## 2014-07-23 ENCOUNTER — Telehealth: Payer: Self-pay | Admitting: Cardiology

## 2014-07-23 ENCOUNTER — Encounter: Payer: Self-pay | Admitting: Physician Assistant

## 2014-07-23 VITALS — BP 128/52 | HR 76 | Ht 65.0 in | Wt 157.0 lb

## 2014-07-23 DIAGNOSIS — R634 Abnormal weight loss: Secondary | ICD-10-CM | POA: Diagnosis not present

## 2014-07-23 DIAGNOSIS — Z85038 Personal history of other malignant neoplasm of large intestine: Secondary | ICD-10-CM | POA: Diagnosis not present

## 2014-07-23 DIAGNOSIS — R197 Diarrhea, unspecified: Secondary | ICD-10-CM | POA: Diagnosis not present

## 2014-07-23 MED ORDER — SACCHAROMYCES BOULARDII 250 MG PO CAPS: 250.0000 mg | ORAL_CAPSULE | Freq: Two times a day (BID) | ORAL | Status: DC

## 2014-07-23 MED ORDER — METRONIDAZOLE 250 MG PO TABS: ORAL_TABLET | ORAL | Status: DC

## 2014-07-23 NOTE — Progress Notes (Signed)
Reviewed and agree with empiric Flagyl 250 qid x 10 days. Stool studies. Will see in follow up. Flex sigm if necessary to r/o microscopic colitis/

## 2014-07-23 NOTE — Patient Instructions (Addendum)
Please go to the basement level to our lab for stool studies.  We sent prescriptions to [facility], [place].  1. Flagyl ( Metronidazole ) 250 mg.  2. Florastor Probiotic  Call back in 3 weeks if diarrhea is persisting for appointment with Amy Esterwood PA-C.   Appointment with [facility]. ( Same building as [facility]. ) 08-02-2014 at 11:15 am.  Dr Ellyn Hack

## 2014-07-23 NOTE — Progress Notes (Signed)
Patient ID: [name] [name], male   DOB: 09-Jun-1940, 74 y.o.   MRN: [id number]   Subjective:    Patient ID: [name] [name], male    DOB: [birth year], 74 y.o.   MRN: [id number]  HPI [name] [name] is a pleasant 74 year old white male known remotely to Dr. Delfin Edis. He was last seen in the office in 2011. His last procedures were done by Dr. or with EGD in 2006 showing esophagitis and colonoscopy which was a negative exam. Patient does have history of malignant colon polyp which was surgically removed in 2000 for per Dr. Margot Chimes. Patient comes in today for evaluation of persistent diarrhea which she says is been present over the past 2 months. Says he has had periodic episodes of diarrhea ever since his colon surgery but generally just 1 episode that is controlled by Imodium when necessary. Now over the past couple of months she's having up to 10 bowel movements a day and several episodes of nocturnal diarrhea as well. He describes the stool is loose to liquid malodorous and nonbloody. He is not having any associated abdominal pain or cramping no fever or chills. No nausea or vomiting. Appetite has been somewhat decreased and his weight may be down about 6 pounds. He been seen by primary care and had stool for C. difficile and culture done in June 2016 both of which were negative. Be has had a couple of hospitalizations within the past 6 months. He was admitted in February 2016 with an MI and had PTCA done of the in-stent stenosis of the RCA. He was then readmitted in April with recurrent angina and again had PTCA done to the RCA. He says he is back to having anginal symptoms now and expects that he will require another in the near future. He is currently on aspirin and Pletal and Plavix is followed by Dr. Ellyn Hack. Patient also with adult-onset diabetes mellitus, end-stage renal disease on dialysis Tuesday Thursday Saturday and has a cardiomyopathy as well as carotid stenosis. Review of Systems Pertinent positive and  negative review of systems were noted in the above HPI section.  All other review of systems was otherwise negative.  Outpatient Encounter Prescriptions as of 07/23/2014  Medication Sig  . aspirin 81 MG chewable tablet Chew 1 tablet (81 mg total) by mouth daily.  Marland Kitchen atorvastatin (LIPITOR) 40 MG tablet Take 1 tablet (40 mg total) by mouth at bedtime.  Marland Kitchen Besifloxacin HCl (BESIVANCE) 0.6 % SUSP Place 1 drop into both eyes See admin instructions. Use eye drops 4 times daily for 2 days following injection by Dr. Zigmund Daniel (once a month)  . carvedilol (COREG) 25 MG tablet Take 1 tablet (25 mg total) by mouth 2 (two) times daily with a meal.  . cilostazol (PLETAL) 100 MG tablet Take 1 tablet (100 mg total) by mouth 2 (two) times daily.  . clopidogrel (PLAVIX) 75 MG tablet Take 1 tablet (75 mg total) by mouth 2 (two) times daily.  . diphenoxylate-atropine (LOMOTIL) 2.5-0.025 MG per tablet Take 1 tablet by mouth 3 (three) times daily as needed for diarrhea or loose stools.  Marland Kitchen glucose blood (PRODIGY NO CODING BLOOD GLUC) test strip Test as directed  . insulin NPH Human (HUMULIN N,NOVOLIN N) 100 UNIT/ML injection Inject 10-20 Units into the skin 2 (two) times daily. Take 10 units if blood sugar is 130-150.  Take 20 units if blood sugar is >150.  Marland Kitchen isosorbide mononitrate (IMDUR) 120 MG 24 hr tablet Take 1 tablet (120 mg total)  by mouth daily.  Marland Kitchen levothyroxine (SYNTHROID, LEVOTHROID) 125 MCG tablet TAKE ONE TABLET BY MOUTH ONCE DAILY BEFORE BREAKFAST  . loperamide (IMODIUM) 2 MG capsule Take 2 mg by mouth daily as needed for diarrhea or loose stools.   . multivitamin (RENA-VIT) TABS tablet Take 1 tablet by mouth daily.  . nitroGLYCERIN (NITROSTAT) 0.4 MG SL tablet Place 1 tablet (0.4 mg total) under the tongue every 5 (five) minutes as needed for chest pain.  . sevelamer carbonate (RENVELA) 800 MG tablet Take 3(three)  $RemoveB'800mg'mjkoHSWh$  tablets by mouth with every meal.  . metroNIDAZOLE (FLAGYL) 250 MG tablet Take 1 tab by  mouth 4 times daily for 14 days with food.  . saccharomyces boulardii (FLORASTOR) 250 MG capsule Take 1 capsule (250 mg total) by mouth 2 (two) times daily.   No facility-administered encounter medications on file as of 07/23/2014.   Allergies  Allergen Reactions  . Brilinta [Ticagrelor]     Shortness of breath  . Shellfish Allergy     Causes gout flare-ups   Patient Active Problem List   Diagnosis Date Noted  . Watery diarrhea 07/01/2014  . Pain in the chest   . End-stage renal disease on hemodialysis   . Plavix resistance   . LBBB (left bundle branch block) 04/19/2014  . Carotid arterial disease   . Coronary artery disease involving coronary bypass graft of native heart with unspecified angina pectoris   . NSTEMI (non-ST elevated myocardial infarction) 10/13/2013  . Presence of drug coated stent in right coronary artery 04/30/2013  . Cough 04/13/2013  . OM (otitis media) 01/21/2013  . Angina, class II - stable 10/24/2012    Class: Diagnosis of  . History of Non-ST elevated myocardial infarction (non-STEMI)   . Orthopnea 09/29/2012  . CAD (coronary artery disease) of bypass graft - PCI to SVG-OM with BMS; PCI-mid RCA with a Xience DES 09/29/2012  . Routine general medical examination at a health care facility 06/20/2012  . Decreased radial pulse 03/17/2012  . Medication management 12/17/2011  . Breast lump 07/25/2011  . Neoplasm of uncertain behavior of skin 06/18/2011  . 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 CAD S/P percutaneous coronary angioplasty 10/09/2010  . Annual physical exam 06/15/2010  . DIARRHEA, CHRONIC 08/17/2009  . HIATAL HERNIA 08/03/2009  . Personal history of other diseases of digestive system 08/03/2009  . RASH-NONVESICULAR 04/14/2009  . ANEMIA, IRON DEFICIENCY 02/18/2009  . RINGWORM 02/15/2009  . Chronic combined systolic and diastolic congestive heart failure, NYHA class 2 02/15/2009  . Hypothyroidism 12/08/2007  . Diabetes mellitus  with neuropathy 12/08/2007  . Hyperlipidemia with target LDL less than 70 12/08/2007  . Gout 12/08/2007  . Essential hypertension 12/08/2007  . RENAL INSUFFICIENCY, CHRONIC 12/08/2007  . LOW BACK PAIN 12/08/2007  . COLON CANCER, HX OF 12/08/2007  . CEREBROVASCULAR ACCIDENT, HX OF 12/08/2007   History   Social History  . Marital Status: Married    Spouse Name: N/A  . Number of Children: 3  . Years of Education: N/A   Occupational History  . Not on file.   Social History Main Topics  . Smoking status: Never Smoker   . Smokeless tobacco: Never Used  . Alcohol Use: No  . Drug Use: No  . Sexual Activity: No   Other Topics Concern  . Not on file   Social History Narrative   Long-term patient of Dr. Rollene Fare.   Married father of 44, grandfather 71.   Never smoked.  Not exercising D2 hip and back pain & now Angina    Mr. No family history includes Diabetes in his father and mother; Heart attack in his father and mother; Heart disease in his father and mother; Hypertension in his father and mother; Stroke in his paternal aunt and paternal uncle.      Objective:    Filed Vitals:   07/23/14 0924  BP: 128/52  Pulse: 76    Physical Exam  L developed older white male in no acute distress, blood pressure 128/52 pulse 76 height 5 foot 5 weight 157. HEENT; nontraumatic normocephalic EOMI PERRLA sclera anicteric, Supple ;no JVD, Cardiovascular; regular rate and rhythm with S1-S2 no murmur or gallop, Pulmonary; clear bilaterally, Abdomen ;soft nontender nondistended bowel sounds are active there is no palpable mass or hepatosplenomegaly on the midline incisional scar, Rectal ;exam not done, Extremities; does have a dialysis graft in the left upper extremity no clubbing cyanosis or edema, Neuropsych ;mood and affect appropriate       Assessment & Plan:    #1 74 yo male with 2 month hx of diarrhea , and 6 pound weight loss- etiology not clear - still some suspicion for cdiff  with 2 recent hospital stays,r/o microscopic colitis  #2 ESRD -on dialysis #3 CAD - multiple intervention s with in stent restenosis RCA- now with recurrent  Angina , and MI 02/2014, and cath with PTCA 4 /2016 #4 chronic antiplatelet therapy-plavx and pletal  #5 AODM  Plan' Stool for cdiff PCR, lactoferrin,fecal elastase, TTG,IGA, ESR/CRP CBC Empiric flagyl 250 mg 4 x daily x 14 days, and Florastor BID x one month  Appt made for pt with cardiology as anginal sxs progressing Would hope to avoid Colonoscopy as he is high risk  He will follow up in one month   Arlayne Liggins S Kiante Petrovich PA-C 07/23/2014   Cc: Midge Minium, MD

## 2014-07-23 NOTE — Telephone Encounter (Signed)
Amy was calling in to speak with Ivin Booty about the patient having chest pain . Please advise  Thanks

## 2014-07-23 NOTE — Telephone Encounter (Signed)
Spoke to Amy -extender( GI ) Patient is there now and has informed her he has been having angina  And was unable to get an appointment until Sept 2016. Amy states patient need a colonoscopy but GI-- WILL HOLD OFF AT PRESENT TIME RN informed Amy- appointment will be schedule for Monday 08/02/14 11:15 She will inform patient

## 2014-07-24 DIAGNOSIS — D631 Anemia in chronic kidney disease: Secondary | ICD-10-CM | POA: Diagnosis not present

## 2014-07-24 DIAGNOSIS — E1129 Type 2 diabetes mellitus with other diabetic kidney complication: Secondary | ICD-10-CM | POA: Diagnosis not present

## 2014-07-24 DIAGNOSIS — N186 End stage renal disease: Secondary | ICD-10-CM | POA: Diagnosis not present

## 2014-07-24 DIAGNOSIS — N2581 Secondary hyperparathyroidism of renal origin: Secondary | ICD-10-CM | POA: Diagnosis not present

## 2014-07-25 NOTE — Assessment & Plan Note (Signed)
Chronic problem.  Typically well controlled- using SSI.  UTD on eye exam.  On HD due to renal failure so no ACE/ARB.  Check labs.  Adjust SSI prn.

## 2014-07-25 NOTE — Assessment & Plan Note (Signed)
Chronic problem.  Currently asymptomatic.  Check labs.  Adjust meds prn  

## 2014-07-25 NOTE — Assessment & Plan Note (Signed)
Chronic problem.  Adequate control.  Continues to have issues w/ hypotension after HD.  No med changes at this time.  Will follow.

## 2014-07-25 NOTE — Assessment & Plan Note (Signed)
Chronic problem.  Pt continues to have angina.  Following w/ Cards.  They are running out of options for intervention and he is aware of this.  This has him very discouraged.  Will continue to follow along.

## 2014-07-25 NOTE — Assessment & Plan Note (Signed)
Pt continues on HD Tues/Thurs/Sat via large fistula in upper L arm.

## 2014-07-25 NOTE — Assessment & Plan Note (Signed)
Pt's PE unchanged from previous.  Due for colonoscopy- has appt w/ GI scheduled.  Check labs.  Anticipatory guidance provided.

## 2014-07-25 NOTE — Assessment & Plan Note (Signed)
Chronic problem.  Tolerating statin w/o difficulty.  Check labs.  Adjust meds prn  

## 2014-07-26 ENCOUNTER — Other Ambulatory Visit: Payer: Medicare Other

## 2014-07-26 DIAGNOSIS — Z85038 Personal history of other malignant neoplasm of large intestine: Secondary | ICD-10-CM | POA: Diagnosis not present

## 2014-07-26 DIAGNOSIS — R634 Abnormal weight loss: Secondary | ICD-10-CM | POA: Diagnosis not present

## 2014-07-26 DIAGNOSIS — R197 Diarrhea, unspecified: Secondary | ICD-10-CM | POA: Diagnosis not present

## 2014-07-27 DIAGNOSIS — N186 End stage renal disease: Secondary | ICD-10-CM | POA: Diagnosis not present

## 2014-07-27 DIAGNOSIS — D631 Anemia in chronic kidney disease: Secondary | ICD-10-CM | POA: Diagnosis not present

## 2014-07-27 DIAGNOSIS — N2581 Secondary hyperparathyroidism of renal origin: Secondary | ICD-10-CM | POA: Diagnosis not present

## 2014-07-27 DIAGNOSIS — E1129 Type 2 diabetes mellitus with other diabetic kidney complication: Secondary | ICD-10-CM | POA: Diagnosis not present

## 2014-07-27 LAB — FECAL LACTOFERRIN, QUANT: LACTOFERRIN: NEGATIVE

## 2014-07-27 LAB — CLOSTRIDIUM DIFFICILE BY PCR: Toxigenic C. Difficile by PCR: NOT DETECTED

## 2014-07-29 DIAGNOSIS — N186 End stage renal disease: Secondary | ICD-10-CM | POA: Diagnosis not present

## 2014-07-29 DIAGNOSIS — N2581 Secondary hyperparathyroidism of renal origin: Secondary | ICD-10-CM | POA: Diagnosis not present

## 2014-07-29 DIAGNOSIS — D631 Anemia in chronic kidney disease: Secondary | ICD-10-CM | POA: Diagnosis not present

## 2014-07-29 DIAGNOSIS — E1129 Type 2 diabetes mellitus with other diabetic kidney complication: Secondary | ICD-10-CM | POA: Diagnosis not present

## 2014-07-31 DIAGNOSIS — N186 End stage renal disease: Secondary | ICD-10-CM | POA: Diagnosis not present

## 2014-07-31 DIAGNOSIS — D631 Anemia in chronic kidney disease: Secondary | ICD-10-CM | POA: Diagnosis not present

## 2014-07-31 DIAGNOSIS — N2581 Secondary hyperparathyroidism of renal origin: Secondary | ICD-10-CM | POA: Diagnosis not present

## 2014-07-31 DIAGNOSIS — E1129 Type 2 diabetes mellitus with other diabetic kidney complication: Secondary | ICD-10-CM | POA: Diagnosis not present

## 2014-08-02 ENCOUNTER — Encounter: Payer: Self-pay | Admitting: Cardiology

## 2014-08-02 ENCOUNTER — Ambulatory Visit (INDEPENDENT_AMBULATORY_CARE_PROVIDER_SITE_OTHER): Payer: Medicare Other | Admitting: Cardiology

## 2014-08-02 VITALS — BP 102/62 | HR 77 | Ht 65.0 in | Wt 157.2 lb

## 2014-08-02 DIAGNOSIS — D689 Coagulation defect, unspecified: Secondary | ICD-10-CM | POA: Diagnosis not present

## 2014-08-02 DIAGNOSIS — Z789 Other specified health status: Secondary | ICD-10-CM

## 2014-08-02 DIAGNOSIS — E785 Hyperlipidemia, unspecified: Secondary | ICD-10-CM

## 2014-08-02 DIAGNOSIS — Z992 Dependence on renal dialysis: Secondary | ICD-10-CM

## 2014-08-02 DIAGNOSIS — I2510 Atherosclerotic heart disease of native coronary artery without angina pectoris: Secondary | ICD-10-CM

## 2014-08-02 DIAGNOSIS — I25708 Atherosclerosis of coronary artery bypass graft(s), unspecified, with other forms of angina pectoris: Secondary | ICD-10-CM | POA: Diagnosis not present

## 2014-08-02 DIAGNOSIS — Z955 Presence of coronary angioplasty implant and graft: Secondary | ICD-10-CM

## 2014-08-02 DIAGNOSIS — I208 Other forms of angina pectoris: Secondary | ICD-10-CM | POA: Diagnosis not present

## 2014-08-02 DIAGNOSIS — N186 End stage renal disease: Secondary | ICD-10-CM

## 2014-08-02 DIAGNOSIS — I25709 Atherosclerosis of coronary artery bypass graft(s), unspecified, with unspecified angina pectoris: Secondary | ICD-10-CM

## 2014-08-02 DIAGNOSIS — I10 Essential (primary) hypertension: Secondary | ICD-10-CM

## 2014-08-02 DIAGNOSIS — I209 Angina pectoris, unspecified: Secondary | ICD-10-CM | POA: Diagnosis not present

## 2014-08-02 DIAGNOSIS — I509 Heart failure, unspecified: Secondary | ICD-10-CM | POA: Diagnosis not present

## 2014-08-02 DIAGNOSIS — I5042 Chronic combined systolic (congestive) and diastolic (congestive) heart failure: Secondary | ICD-10-CM | POA: Diagnosis not present

## 2014-08-02 DIAGNOSIS — Z01818 Encounter for other preprocedural examination: Secondary | ICD-10-CM

## 2014-08-02 DIAGNOSIS — Z9861 Coronary angioplasty status: Secondary | ICD-10-CM | POA: Diagnosis not present

## 2014-08-02 DIAGNOSIS — E1140 Type 2 diabetes mellitus with diabetic neuropathy, unspecified: Secondary | ICD-10-CM

## 2014-08-02 NOTE — Assessment & Plan Note (Addendum)
He is on twice a day Plavix and Pletal as well as aspirin, statin, Coreg and Imdur that he has increased the dose.  Plan:relook catheterization tomorrow using a femoral approach. If I do see some in-stent restenosis so likely balloon for symptom relief but then would consider returning him for rotational atherectomy that would need to be organized device rep in place. I've discussed with colleagues and have searched literature. It would be nice if we had drug coated balloons, however we do not have are present. There is no location nearby that conducts brachitherapy.

## 2014-08-02 NOTE — Patient Instructions (Signed)
SCHEDULE  THIS Tuesday or Thursday  After diaylsis-----LEFT HEART CATH W/GRAFTS--- RIGHT GROIN ACCESS. Your physician has requested that you have a cardiac catheterization. Cardiac catheterization is used to diagnose and/or treat various heart conditions. Doctors may recommend this procedure for a number of different reasons. The most common reason is to evaluate chest pain. Chest pain can be a symptom of coronary artery disease (CAD), and cardiac catheterization can show whether plaque is narrowing or blocking your heart's arteries. This procedure is also used to evaluate the valves, as well as measure the blood flow and oxygen levels in different parts of your heart. For further information please visit [email or web address]. Please follow instruction sheet, as given.  Please do labs -cbc, bmp ,pt,ptt,  Your physician recommends that you schedule a follow-up appointment in follow after cath.

## 2014-08-02 NOTE — Assessment & Plan Note (Signed)
At this point he presents yet again with class 3-4 angina. Unfortunately he has essentially intractable, life limiting angina now and the only option I can see is to go back for PTCA. I am reviewing the literature to determine if there is any other treatment option including rotational atherectomy. I think, the best option for now is to potentially do redo PTCA for symptom relief and then bring him back for direct rotational atherectomy with PTCA if this is thought to be an option after reviewing with colleagues.

## 2014-08-02 NOTE — Assessment & Plan Note (Signed)
Stable, controlled. Does still have some issues with hypotension during dialysis. Would not further titrate medications.

## 2014-08-02 NOTE — Progress Notes (Signed)
PCP: [name] Asa, MD  Clinic Note: Chief Complaint  Patient presents with  . Follow-up    chest pain-has had chest pain,pressure and tightness, occassional shortness of breath, no edema, no pain in legs, no cramping in legs,has had lightheadedness, has had dizziness  . Chest Pain  . Coronary Artery Disease    HPI: [name] [name] is a 74 y.o. male with a PMH below who presents today for second post hospital followup. He has a long-standing history of CAD status post CABG/redo CABG and PCI. Most recently he has had multiple interventions on the RCA. He was asked to be admitted on April 11 and underwent cardiac catheterization following day with PTCA of in-stent restenosis in the triple layers that area of the mid RCA. Unfortunately, each attempted intervention on the RCA requires use of a guideline her just to simply get balloon downstream. Initially post PCI he felt quite well for about 2-3 days and is now back to his baseline use of nitroglycerin. He was relatively well-controlled however until the last month.  For additional treatment he was laced on twice a day Plavix as well as Pletal. Unfortunately, we were not able to find a location for him to relatively nearby for brachial therapy. I referred him back to Dr. Prescott [name] to explore other redo CABG options but this was as expected, unfruitful  Past Medical History  Diagnosis Date  . Diabetes mellitus   . Hyperlipidemia   . Hypertension   . Gout   . Hypothyroidism     On supplementation  . LBBB (left bundle branch block)     Chronic  . Anemia     Likely secondary to history of GI bleed  . Stroke 1998  . CAD in native artery; and the grafts 1992, 1997, 2002, 2006, 2012    CABG 1992 and redo CABG 2006  . CAD (coronary artery disease) of bypass graft 2006, 2012    In 2006: Occluded SVG-OM noted (SVG-diagonal was previously occluded); 2012: Severe lesion in redo SVG-OM1 --> BMS PCI   . CAD S/P percutaneous coronary angioplasty  October 2012; Feb, Apr & Oct 2015; Feb 2016    a) s/p CABG 1992 and redo 2006. b) 10/'12: NSTEMI: PCI to SVG-OM1 Integrity BMS 3.5 x 15 (3.85 mm) c) 2/'15: UA - PCI mid RCA Xience Ap DES 2.75 x 15 (3.1 mm). d) 4/'15 : UA - focal dRCA Resolute DES 2.25 x 8 (2.5 mm). d) 10/'15: PCI to mRCA ISR w/ post stent lesion - Promus P 2.75 x 16 (3.25 mm). e) 2/'16: NSTEMI: Cutting Balloon PTCA of  mRCA ISR (3.5 mm post-dilation).  . Ischemic cardiomyopathy     a) EF 45-50% by echo 2015. b) EF 50% by cath 04/2014.  Marland Kitchen History of Non-ST elevated myocardial infarction (non-STEMI) 1992, 2006, 2012; 02/2013  . Peptic ulcer disease  2004  . BPH (benign prostatic hyperplasia)   . Chronic low back pain   . Carotid arterial disease     a) Duplex 05/2012: R BULB/PROX ICA: 50-69%, L BULB/PROX ICA: 0-49%. ;; 04/29/2014: [id number] RICA, [id number] LICA. Patetn vertebrals,  . Colon cancer 2003    colectomy for CA. no recurrence . never required  radiation or chem  . End stage renal disease on dialysis     Dialyzes at [facility]: Tues/Th/Sat - left upper cavity AV fistula brachiocephalic  . Plavix resistance     a) P2Y12 264 in 02/2014 - put on Brilinta but did not tolerate  due to SOB. b) cannot be on Effient due to history of stroke. c) Plavix increased to 75mg  BID and Pletal added 04/2014 due to ISR.    Prior Cardiac Evaluation and Past Surgical History:reviewed in Epic  Interval History: Unfortunately, [name] [name] was doing well after his last PTCA up until about the last 2 weeks and is now been having class 3-4 anginal symptoms yet again. Occasionally occurring at rest or with minimal exertion. He started having it during dialysis now for the first time. Limited to doing absolutely nothing besides sitting around the house due to limitation from his symptoms. He is taking almost a whole bottle of nitroglycerin course of a week. He has increased his Imdur dose to double. He denies any heart failure symptoms of PND, orthopnea or  edema. He does however have exertional dyspnea that is becoming worse. No significant edema since he is on dialysis. He denies having any palpitations or rapid/irregular heartbeats. No syncope/near syncope or TIA symptoms he gets symptoms.  No melena, hematochezia, hematuria, or epstaxis. No claudication.  ROS: A comprehensive was performed. Review of Systems  Constitutional: Positive for malaise/fatigue (Very worn-out).  HENT: Negative for nosebleeds.   Respiratory: Positive for shortness of breath (Pretty much constant). Negative for cough.   Cardiovascular: Negative for claudication and leg swelling.  Gastrointestinal: Positive for heartburn, diarrhea and constipation (had previously had constipation now has diarrhea). Negative for blood in stool and melena.  Genitourinary: Negative for hematuria.  Musculoskeletal: Positive for joint pain. Negative for falls.  Neurological: Positive for dizziness (Mostly positional but some vertigo-type symptoms).  Endo/Heme/Allergies: Bruises/bleeds easily.  Psychiatric/Behavioral: Positive for depression (Because he just can't do anything).  All other systems reviewed and are negative.   Current Outpatient Prescriptions on File Prior to Visit  Medication Sig Dispense Refill  . aspirin 81 MG chewable tablet Chew 1 tablet (81 mg total) by mouth daily.    Marland Kitchen atorvastatin (LIPITOR) 40 MG tablet Take 1 tablet (40 mg total) by mouth at bedtime. 90 tablet 1  . Besifloxacin HCl (BESIVANCE) 0.6 % SUSP Place 1 drop into both eyes See admin instructions. Use eye drops 4 times daily for 2 days following injection by Dr. Zigmund [name] (once a month)    . carvedilol (COREG) 25 MG tablet Take 1 tablet (25 mg total) by mouth 2 (two) times daily with a meal. 180 tablet 1  . cilostazol (PLETAL) 100 MG tablet Take 1 tablet (100 mg total) by mouth 2 (two) times daily. 180 tablet 3  . clopidogrel (PLAVIX) 75 MG tablet Take 1 tablet (75 mg total) by mouth 2 (two) times daily.  60 tablet 6  . glucose blood (PRODIGY NO CODING BLOOD GLUC) test strip Test as directed 100 each 6  . insulin NPH Human (HUMULIN N,NOVOLIN N) 100 UNIT/ML injection Inject 10-20 Units into the skin 2 (two) times daily. Take 10 units if blood sugar is 130-150.  Take 20 units if blood sugar is >150.    Marland Kitchen isosorbide mononitrate (IMDUR) 120 MG 24 hr tablet Take 1 tablet (120 mg total) by mouth daily. 90 tablet 3  . levothyroxine (SYNTHROID, LEVOTHROID) 125 MCG tablet TAKE ONE TABLET BY MOUTH ONCE DAILY BEFORE BREAKFAST 90 tablet 1  . loperamide (IMODIUM) 2 MG capsule Take 2 mg by mouth daily as needed for diarrhea or loose stools.     . multivitamin (RENA-VIT) TABS tablet Take 1 tablet by mouth daily. 90 tablet 3  . nitroGLYCERIN (NITROSTAT) 0.4 MG SL tablet Place 1  tablet (0.4 mg total) under the tongue every 5 (five) minutes as needed for chest pain. 100 tablet 4  . sevelamer carbonate (RENVELA) 800 MG tablet Take 3(three)  800mg  tablets by mouth with every meal.     No current facility-administered medications on file prior to visit.   Allergies  Allergen Reactions  . Brilinta [Ticagrelor]     Shortness of breath  . Shellfish Allergy     Causes gout flare-ups    History  Substance Use Topics  . Smoking status: Never Smoker   . Smokeless tobacco: Never Used  . Alcohol Use: No   Family History  Problem Relation Age of Onset  . Heart disease Mother   . Hypertension Mother   . Diabetes Mother   . Heart disease Father   . Hypertension Father   . Diabetes Father   . Heart attack Mother   . Heart attack Father   . Stroke Paternal Aunt   . Stroke Paternal Uncle     Wt Readings from Last 3 Encounters:  08/02/14 71.328 kg (157 lb 4 oz)  07/23/14 71.215 kg (157 lb)  07/21/14 71.328 kg (157 lb 4 oz)    PHYSICAL EXAM BP 102/62 mmHg  Pulse 77  Ht 5\' 5"  (1.651 m)  Wt 71.328 kg (157 lb 4 oz)  BMI 26.17 kg/m2 General appearance: alert, cooperative, appears stated age, no distress;  and Borderline obese. Chronically ill-appearing, pale, well groomed.Marland Kitchen answers questions appropriately.  Neck: no adenopathy, no carotid bruit, mild JVD; Supple, symmetrical, trachea midline  Lungs: CTAB, normal percussion bilaterally and Nonlabored, good air movement  Heart: normal apical impulse, RRR, S1, S2 normal, S4 present, SEM 2/6, crescendo, decrescendo and harsh at 2nd right intercostal space, no click and no rub  Abdomen: soft, non-tender; bowel sounds normal; no masses, no organomegaly and Mild truncal obesity  Extremities: extremities normal, atraumatic, no cyanosis or edema and fistula in the left upper arm with good thrill  Pulses: 2+ and symmetric  Neurologic: Grossly normal Pscyh;somewhat depressed mood & affect    Adult ECG Report - normal sinus rhythm, 77 BPM, nonspecific IVCD. Essentially stable. QTc is slightly prolonged at 509. Otherwise normal intervals. Axis is rightward. Nonspecific ST segment changes in inferior lateral leads that is stable.  Recent Labs:    Lab Results  Component Value Date   CHOL 71 07/21/2014   HDL 28.70* 07/21/2014   LDLCALC 22 07/21/2014   LDLDIRECT 33.5 12/22/2012   TRIG 104.0 07/21/2014   CHOLHDL 2 07/21/2014    ASSESSMENT / PLAN: Very unfortunate situation with already having redo CABG and both graft conduits to the RCA system have been occluded. He has recurrent in-stent restenosis involving the mid RCA. He has had multiple interventions on the RCA with the stents or to the balloon angioplasty with cutting balloon for support wounds.  He was seen by Dr. Prescott [name] from cardiac surgery who as expected do not believe that he has any surgical options available.     Problem List Items Addressed This Visit    Angina, class III - Primary    At this point he presents yet again with class 3-4 angina. Unfortunately he has essentially intractable, life limiting angina now and the only option I can see is to go back for PTCA. I am reviewing  the literature to determine if there is any other treatment option including rotational atherectomy. I think, the best option for now is to potentially do redo PTCA for symptom relief and  then bring him back for direct rotational atherectomy with PTCA if this is thought to be an option after reviewing with colleagues.      CAD (coronary artery disease) of bypass graft - PCI to SVG-OM with BMS; PCI-mid RCA with a Xience DES (Chronic)    He is on twice a day Plavix and Pletal as well as aspirin, statin, Coreg and Imdur that he has increased the dose.  Plan:relook catheterization tomorrow using a femoral approach. If I do see some in-stent restenosis so likely balloon for symptom relief but then would consider returning him for rotational atherectomy that would need to be organized device rep in place. I've discussed with colleagues and have searched literature. It would be nice if we had drug coated balloons, however we do not have are present. There is no location nearby that conducts brachitherapy.      Relevant Orders   EKG 12-Lead   LEFT HEART CATHETERIZATION WITH CORONARY/GRAFT ANGIOGRAM   Basic metabolic panel   CBC   APTT   Protime-INR   CAD S/P percutaneous coronary angioplasty (Chronic)    With multiple stents overlapping and in-stent restenosis, and we've I would love to really use IVUS, however based on the fact with the use guidelines are to get a balloon downstream, don't mother get IVUS downstream. This may also limit our ability to use rotational atherectomy for debulking.      Relevant Orders   EKG 12-Lead   LEFT HEART CATHETERIZATION WITH CORONARY/GRAFT ANGIOGRAM   Basic metabolic panel   CBC   APTT   Protime-INR   Chronic combined systolic and diastolic congestive heart failure, NYHA class 2 (Chronic)   Relevant Orders   EKG 12-Lead   LEFT HEART CATHETERIZATION WITH CORONARY/GRAFT ANGIOGRAM   Basic metabolic panel   CBC   APTT   Protime-INR   Coronary artery disease  involving coronary bypass graft of native heart with unspecified angina pectoris (Chronic)   Relevant Orders   EKG 12-Lead   LEFT HEART CATHETERIZATION WITH CORONARY/GRAFT ANGIOGRAM   Basic metabolic panel   CBC   APTT   Protime-INR   Diabetes mellitus with neuropathy (Chronic)   End-stage renal disease on hemodialysis   Essential hypertension (Chronic)    Stable, controlled. Does still have some issues with hypotension during dialysis. Would not further titrate medications.      Hyperlipidemia with target LDL less than 70 (Chronic)    Control. On statin. Labs reviewed.      Plavix resistance   Presence of drug coated stent in right coronary artery (Chronic)    Other Visit Diagnoses    Blood clotting disorder        Relevant Orders    APTT    Protime-INR    Pre-op examination           Orders Placed This Encounter  Procedures  . Basic metabolic panel  . CBC  . APTT  . Protime-INR  . EKG 12-Lead  . LEFT HEART CATHETERIZATION WITH CORONARY/GRAFT ANGIOGRAM   No orders of the defined types were placed in this encounter.     Followup: Post cath    Leonie Man, M.D., M.S. Interventional Cardiologist   Pager # [phone]

## 2014-08-02 NOTE — Assessment & Plan Note (Signed)
Control. On statin. Labs reviewed.

## 2014-08-02 NOTE — Assessment & Plan Note (Signed)
With multiple stents overlapping and in-stent restenosis, and we've I would love to really use IVUS, however based on the fact with the use guidelines are to get a balloon downstream, don't mother get IVUS downstream. This may also limit our ability to use rotational atherectomy for debulking.

## 2014-08-03 ENCOUNTER — Encounter (HOSPITAL_COMMUNITY): Payer: Self-pay | Admitting: *Deleted

## 2014-08-03 ENCOUNTER — Ambulatory Visit (HOSPITAL_COMMUNITY)
Admission: RE | Admit: 2014-08-03 | Discharge: 2014-08-04 | Disposition: A | Discharge status: Home / Self Care | Payer: Medicare Other | Source: Ambulatory Visit | Attending: Cardiology | Admitting: Cardiology

## 2014-08-03 ENCOUNTER — Encounter (HOSPITAL_COMMUNITY)
Admission: RE | Disposition: A | Discharge status: Home / Self Care | Payer: Medicare Other | Source: Ambulatory Visit | Attending: Cardiology

## 2014-08-03 DIAGNOSIS — Z8673 Personal history of transient ischemic attack (TIA), and cerebral infarction without residual deficits: Secondary | ICD-10-CM | POA: Insufficient documentation

## 2014-08-03 DIAGNOSIS — N186 End stage renal disease: Secondary | ICD-10-CM | POA: Insufficient documentation

## 2014-08-03 DIAGNOSIS — E039 Hypothyroidism, unspecified: Secondary | ICD-10-CM | POA: Diagnosis not present

## 2014-08-03 DIAGNOSIS — E119 Type 2 diabetes mellitus without complications: Secondary | ICD-10-CM | POA: Insufficient documentation

## 2014-08-03 DIAGNOSIS — E1129 Type 2 diabetes mellitus with other diabetic kidney complication: Secondary | ICD-10-CM | POA: Diagnosis not present

## 2014-08-03 DIAGNOSIS — D689 Coagulation defect, unspecified: Secondary | ICD-10-CM

## 2014-08-03 DIAGNOSIS — E785 Hyperlipidemia, unspecified: Secondary | ICD-10-CM | POA: Insufficient documentation

## 2014-08-03 DIAGNOSIS — I25118 Atherosclerotic heart disease of native coronary artery with other forms of angina pectoris: Secondary | ICD-10-CM | POA: Diagnosis not present

## 2014-08-03 DIAGNOSIS — I25119 Atherosclerotic heart disease of native coronary artery with unspecified angina pectoris: Secondary | ICD-10-CM | POA: Insufficient documentation

## 2014-08-03 DIAGNOSIS — Z951 Presence of aortocoronary bypass graft: Secondary | ICD-10-CM | POA: Insufficient documentation

## 2014-08-03 DIAGNOSIS — I252 Old myocardial infarction: Secondary | ICD-10-CM | POA: Insufficient documentation

## 2014-08-03 DIAGNOSIS — I120 Hypertensive chronic kidney disease with stage 5 chronic kidney disease or end stage renal disease: Secondary | ICD-10-CM | POA: Diagnosis not present

## 2014-08-03 DIAGNOSIS — N2581 Secondary hyperparathyroidism of renal origin: Secondary | ICD-10-CM | POA: Diagnosis not present

## 2014-08-03 DIAGNOSIS — Z955 Presence of coronary angioplasty implant and graft: Secondary | ICD-10-CM | POA: Diagnosis not present

## 2014-08-03 DIAGNOSIS — Z85038 Personal history of other malignant neoplasm of large intestine: Secondary | ICD-10-CM | POA: Diagnosis not present

## 2014-08-03 DIAGNOSIS — I2584 Coronary atherosclerosis due to calcified coronary lesion: Secondary | ICD-10-CM | POA: Insufficient documentation

## 2014-08-03 DIAGNOSIS — Z9861 Coronary angioplasty status: Secondary | ICD-10-CM

## 2014-08-03 DIAGNOSIS — I447 Left bundle-branch block, unspecified: Secondary | ICD-10-CM | POA: Insufficient documentation

## 2014-08-03 DIAGNOSIS — Z992 Dependence on renal dialysis: Secondary | ICD-10-CM | POA: Diagnosis not present

## 2014-08-03 DIAGNOSIS — N400 Enlarged prostate without lower urinary tract symptoms: Secondary | ICD-10-CM | POA: Insufficient documentation

## 2014-08-03 DIAGNOSIS — I10 Essential (primary) hypertension: Secondary | ICD-10-CM | POA: Diagnosis not present

## 2014-08-03 DIAGNOSIS — I25709 Atherosclerosis of coronary artery bypass graft(s), unspecified, with unspecified angina pectoris: Secondary | ICD-10-CM

## 2014-08-03 DIAGNOSIS — T82857A Stenosis of cardiac prosthetic devices, implants and grafts, initial encounter: Secondary | ICD-10-CM | POA: Diagnosis not present

## 2014-08-03 DIAGNOSIS — I2510 Atherosclerotic heart disease of native coronary artery without angina pectoris: Secondary | ICD-10-CM

## 2014-08-03 DIAGNOSIS — Z01818 Encounter for other preprocedural examination: Secondary | ICD-10-CM

## 2014-08-03 DIAGNOSIS — Z7902 Long term (current) use of antithrombotics/antiplatelets: Secondary | ICD-10-CM | POA: Insufficient documentation

## 2014-08-03 DIAGNOSIS — I2582 Chronic total occlusion of coronary artery: Secondary | ICD-10-CM | POA: Diagnosis not present

## 2014-08-03 DIAGNOSIS — I25810 Atherosclerosis of coronary artery bypass graft(s) without angina pectoris: Secondary | ICD-10-CM

## 2014-08-03 DIAGNOSIS — D631 Anemia in chronic kidney disease: Secondary | ICD-10-CM | POA: Diagnosis not present

## 2014-08-03 DIAGNOSIS — I209 Angina pectoris, unspecified: Secondary | ICD-10-CM

## 2014-08-03 DIAGNOSIS — I5042 Chronic combined systolic (congestive) and diastolic (congestive) heart failure: Secondary | ICD-10-CM

## 2014-08-03 HISTORY — PX: CARDIAC CATHETERIZATION: SHX172

## 2014-08-03 LAB — CBC
HCT: 32.6 % — ABNORMAL LOW (ref 39.0–52.0)
HEMATOCRIT: 32.8 % — AB (ref 39.0–52.0)
HEMOGLOBIN: 10.8 g/dL — AB (ref 13.0–17.0)
HEMOGLOBIN: 10.8 g/dL — AB (ref 13.0–17.0)
MCH: 30.3 pg (ref 26.0–34.0)
MCH: 31 pg (ref 26.0–34.0)
MCHC: 32.9 g/dL (ref 30.0–36.0)
MCHC: 33.1 g/dL (ref 30.0–36.0)
MCV: 92.1 fL (ref 78.0–100.0)
MCV: 93.7 fL (ref 78.0–100.0)
MPV: 9 fL (ref 8.6–12.4)
PLATELETS: 240 10*3/uL (ref 150–400)
Platelets: 210 10*3/uL (ref 150–400)
RBC: 3.56 MIL/uL — ABNORMAL LOW (ref 4.22–5.81)
RBC: 3.48 MIL/uL — ABNORMAL LOW (ref 4.22–5.81)
RDW: 15.3 % (ref 11.5–15.5)
RDW: 15.4 % (ref 11.5–15.5)
WBC: 7.7 10*3/uL (ref 4.0–10.5)
WBC: 6.8 10*3/uL (ref 4.0–10.5)

## 2014-08-03 LAB — BASIC METABOLIC PANEL
Anion gap: 11 (ref 5–15)
BUN: 32 mg/dL — ABNORMAL HIGH (ref 7–25)
BUN: 12 mg/dL (ref 6–20)
CALCIUM: 8.9 mg/dL (ref 8.9–10.3)
CO2: 28 mmol/L (ref 20–31)
CO2: 25 mmol/L (ref 22–32)
Calcium: 9.6 mg/dL (ref 8.6–10.3)
Chloride: 100 mmol/L (ref 98–110)
Chloride: 100 mmol/L — ABNORMAL LOW (ref 101–111)
Creat: 4.81 mg/dL — ABNORMAL HIGH (ref 0.70–1.18)
Creatinine, Ser: 2.95 mg/dL — ABNORMAL HIGH (ref 0.61–1.24)
GFR calc non Af Amer: 20 mL/min — ABNORMAL LOW (ref >60)
GFR, EST AFRICAN AMERICAN: 23 mL/min — AB (ref >60)
GLUCOSE: 189 mg/dL — AB (ref 65–99)
Glucose, Bld: 92 mg/dL (ref 65–99)
Potassium: 4 mmol/L (ref 3.5–5.3)
Potassium: 3.8 mmol/L (ref 3.5–5.1)
SODIUM: 136 mmol/L (ref 135–145)
Sodium: 138 mmol/L (ref 135–146)

## 2014-08-03 LAB — POCT ACTIVATED CLOTTING TIME
Activated Clotting Time: 528 seconds
Activated Clotting Time: 159 seconds

## 2014-08-03 LAB — PANCREATIC ELASTASE, FECAL: PANCREATIC ELASTASE-1, STL: 255 ug/g

## 2014-08-03 LAB — GLUCOSE, CAPILLARY
GLUCOSE-CAPILLARY: 102 mg/dL — AB (ref 65–99)
Glucose-Capillary: 95 mg/dL (ref 65–99)

## 2014-08-03 LAB — PROTIME-INR
INR: 1.02 (ref <1.50)
Prothrombin Time: 13.4 seconds (ref 11.6–15.2)

## 2014-08-03 LAB — APTT: APTT: 27 s (ref 24–37)

## 2014-08-03 LAB — MRSA PCR SCREENING: MRSA BY PCR: NEGATIVE

## 2014-08-03 SURGERY — LEFT HEART CATH AND CORS/GRAFTS ANGIOGRAPHY
Anesthesia: LOCAL

## 2014-08-03 MED ORDER — LIDOCAINE HCL (PF) 1 % IJ SOLN
INTRAMUSCULAR | Status: AC
Start: 2014-08-03 — End: 2014-08-03
  Filled 2014-08-03: qty 30

## 2014-08-03 MED ORDER — SODIUM CHLORIDE 0.9 % IV SOLN: 250.0000 mL | INTRAVENOUS | Status: DC | PRN

## 2014-08-03 MED ORDER — LEVOTHYROXINE SODIUM 125 MCG PO TABS
125.0000 ug | ORAL_TABLET | Freq: Every day | ORAL | Status: DC
  Administered 2014-08-04: 125 ug via ORAL
  Filled 2014-08-03 (×2): qty 1

## 2014-08-03 MED ORDER — CLOPIDOGREL BISULFATE 75 MG PO TABS
75.0000 mg | ORAL_TABLET | Freq: Two times a day (BID) | ORAL | Status: DC
  Administered 2014-08-03 – 2014-08-04 (×2): 75 mg via ORAL
  Filled 2014-08-03 (×4): qty 1

## 2014-08-03 MED ORDER — HEPARIN (PORCINE) IN NACL 2-0.9 UNIT/ML-% IJ SOLN
INTRAMUSCULAR | Status: AC
  Filled 2014-08-03: qty 1500

## 2014-08-03 MED ORDER — SODIUM CHLORIDE 0.9 % IV SOLN
250.0000 mg | INTRAVENOUS | Status: DC | PRN
  Administered 2014-08-03: 1.75 mg/kg/h via INTRAVENOUS

## 2014-08-03 MED ORDER — ACETAMINOPHEN 325 MG PO TABS: 650.0000 mg | ORAL_TABLET | ORAL | Status: DC | PRN

## 2014-08-03 MED ORDER — ONDANSETRON HCL 4 MG/2ML IJ SOLN: 4.0000 mg | Freq: Four times a day (QID) | INTRAMUSCULAR | Status: DC | PRN

## 2014-08-03 MED ORDER — NITROGLYCERIN 1 MG/10 ML FOR IR/CATH LAB
INTRA_ARTERIAL | Status: AC
  Filled 2014-08-03: qty 10

## 2014-08-03 MED ORDER — BIVALIRUDIN 250 MG IV SOLR
INTRAVENOUS | Status: AC
Start: 2014-08-03 — End: 2014-08-03
  Filled 2014-08-03: qty 250

## 2014-08-03 MED ORDER — ATORVASTATIN CALCIUM 40 MG PO TABS
40.0000 mg | ORAL_TABLET | Freq: Every day | ORAL | Status: DC
  Administered 2014-08-03: 40 mg via ORAL
  Filled 2014-08-03 (×2): qty 1

## 2014-08-03 MED ORDER — ASPIRIN 81 MG PO CHEW
81.0000 mg | CHEWABLE_TABLET | Freq: Every day | ORAL | Status: DC
  Administered 2014-08-03: 81 mg via ORAL
  Filled 2014-08-03: qty 1

## 2014-08-03 MED ORDER — MIDAZOLAM HCL 2 MG/2ML IJ SOLN
INTRAMUSCULAR | Status: AC
  Filled 2014-08-03: qty 2

## 2014-08-03 MED ORDER — SEVELAMER CARBONATE 800 MG PO TABS
1600.0000 mg | ORAL_TABLET | ORAL | Status: DC | PRN
  Filled 2014-08-03: qty 2

## 2014-08-03 MED ORDER — SEVELAMER CARBONATE 800 MG PO TABS: 1600.0000 mg | ORAL_TABLET | ORAL | Status: DC

## 2014-08-03 MED ORDER — SODIUM CHLORIDE 0.9 % IJ SOLN
3.0000 mL | Freq: Two times a day (BID) | INTRAMUSCULAR | Status: DC
  Administered 2014-08-03: 3 mL via INTRAVENOUS

## 2014-08-03 MED ORDER — BIVALIRUDIN BOLUS VIA INFUSION - CUPID
INTRAVENOUS | Status: DC | PRN
  Administered 2014-08-03: 62.925 mg via INTRAVENOUS

## 2014-08-03 MED ORDER — SODIUM CHLORIDE 0.9 % IJ SOLN: 3.0000 mL | INTRAMUSCULAR | Status: DC | PRN

## 2014-08-03 MED ORDER — CARVEDILOL 12.5 MG PO TABS
25.0000 mg | ORAL_TABLET | Freq: Two times a day (BID) | ORAL | Status: DC
  Administered 2014-08-03 – 2014-08-04 (×2): 25 mg via ORAL
  Filled 2014-08-03 (×4): qty 2

## 2014-08-03 MED ORDER — INSULIN NPH (HUMAN) (ISOPHANE) 100 UNIT/ML ~~LOC~~ SUSP
10.0000 [IU] | Freq: Two times a day (BID) | SUBCUTANEOUS | Status: DC
  Filled 2014-08-03 (×2): qty 10

## 2014-08-03 MED ORDER — RENA-VITE PO TABS
1.0000 | ORAL_TABLET | Freq: Every day | ORAL | Status: DC
  Administered 2014-08-04: 1 via ORAL
  Filled 2014-08-03: qty 1

## 2014-08-03 MED ORDER — MIDAZOLAM HCL 2 MG/2ML IJ SOLN
INTRAMUSCULAR | Status: DC | PRN
  Administered 2014-08-03: 2 mg via INTRAVENOUS
  Administered 2014-08-03: 1 mg via INTRAVENOUS

## 2014-08-03 MED ORDER — NITROGLYCERIN 0.4 MG SL SUBL: 0.4000 mg | SUBLINGUAL_TABLET | SUBLINGUAL | Status: DC | PRN

## 2014-08-03 MED ORDER — SODIUM CHLORIDE 0.9 % IJ SOLN: 3.0000 mL | Freq: Two times a day (BID) | INTRAMUSCULAR | Status: DC

## 2014-08-03 MED ORDER — CILOSTAZOL 100 MG PO TABS
100.0000 mg | ORAL_TABLET | Freq: Two times a day (BID) | ORAL | Status: DC
  Administered 2014-08-03 – 2014-08-04 (×2): 100 mg via ORAL
  Filled 2014-08-03 (×3): qty 1

## 2014-08-03 MED ORDER — SODIUM CHLORIDE 0.9 % IV SOLN: INTRAVENOUS | Status: DC

## 2014-08-03 MED ORDER — NITROGLYCERIN 0.4 MG/SPRAY TL SOLN
Status: DC | PRN
  Administered 2014-08-03: 1 via SUBLINGUAL

## 2014-08-03 MED ORDER — FENTANYL CITRATE (PF) 100 MCG/2ML IJ SOLN
INTRAMUSCULAR | Status: AC
  Filled 2014-08-03: qty 2

## 2014-08-03 MED ORDER — IOHEXOL 350 MG/ML SOLN
INTRAVENOUS | Status: DC | PRN
  Administered 2014-08-03: 150 mL via INTRA_ARTERIAL

## 2014-08-03 MED ORDER — ISOSORBIDE MONONITRATE ER 60 MG PO TB24
120.0000 mg | ORAL_TABLET | Freq: Every day | ORAL | Status: DC
  Administered 2014-08-04: 120 mg via ORAL
  Filled 2014-08-03: qty 2

## 2014-08-03 MED ORDER — NITROGLYCERIN 1 MG/10 ML FOR IR/CATH LAB
INTRA_ARTERIAL | Status: DC | PRN
  Administered 2014-08-03: 200 ug via INTRACORONARY

## 2014-08-03 MED ORDER — FENTANYL CITRATE (PF) 100 MCG/2ML IJ SOLN
INTRAMUSCULAR | Status: DC | PRN
  Administered 2014-08-03: 25 ug via INTRAVENOUS
  Administered 2014-08-03: 50 ug via INTRAVENOUS
  Administered 2014-08-03: 25 ug via INTRAVENOUS

## 2014-08-03 MED ORDER — SEVELAMER CARBONATE 800 MG PO TABS
2400.0000 mg | ORAL_TABLET | Freq: Three times a day (TID) | ORAL | Status: DC
  Filled 2014-08-03 (×2): qty 3

## 2014-08-03 MED ORDER — LIDOCAINE-PRILOCAINE 2.5-2.5 % EX CREA: 1.0000 "application " | TOPICAL_CREAM | CUTANEOUS | Status: DC

## 2014-08-03 SURGICAL SUPPLY — 27 items
BALLN ANGIOSCULPT RX 3.0X10 (BALLOONS) ×2
BALLN ANGIOSCULPT RX 3.5X6 (BALLOONS) ×2
BALLN TREK RX 2.5X12 (BALLOONS) ×2
BALLN ~~LOC~~ EUPHORA RX 3.25X15 (BALLOONS) ×2
BALLOON ANGIOSCULPT RX 3.0X10 (BALLOONS) IMPLANT
BALLOON ANGIOSCULPT RX 3.5X6 (BALLOONS) IMPLANT
BALLOON TREK RX 2.5X12 (BALLOONS) IMPLANT
BALLOON ~~LOC~~ EUPHORA RX 3.25X15 (BALLOONS) IMPLANT
CATH INFINITI 5 FR IM (CATHETERS) ×1 IMPLANT
CATH INFINITI 5FR ANG PIGTAIL (CATHETERS) ×1 IMPLANT
CATH INFINITI 5FR MULTPACK ANG (CATHETERS) ×1 IMPLANT
CATH OPTITORQUE TIG 4.0 5F (CATHETERS) IMPLANT
DEVICE RAD COMP TR BAND LRG (VASCULAR PRODUCTS) ×1 IMPLANT
GLIDESHEATH SLEND A-KIT 6F 22G (SHEATH) ×1 IMPLANT
GUIDE CATH RUNWAY 6FR AL 1 (CATHETERS) ×1 IMPLANT
GUIDELINER 6F (CATHETERS) ×1 IMPLANT
KIT ENCORE 26 ADVANTAGE (KITS) ×1 IMPLANT
KIT HEART LEFT (KITS) ×2 IMPLANT
PACK CARDIAC CATHETERIZATION (CUSTOM PROCEDURE TRAY) ×2 IMPLANT
SHEATH PINNACLE 5F 10CM (SHEATH) ×1 IMPLANT
SHEATH PINNACLE 6F 10CM (SHEATH) ×1 IMPLANT
SYR MEDRAD MARK V 150ML (SYRINGE) ×2 IMPLANT
TRANSDUCER W/STOPCOCK (MISCELLANEOUS) ×2 IMPLANT
TUBING CIL FLEX 10 FLL-RA (TUBING) ×2 IMPLANT
WIRE ASAHI PROWATER 180CM (WIRE) ×1 IMPLANT
WIRE EMERALD 3MM-J .035X150CM (WIRE) ×1 IMPLANT
WIRE SAFE-T 1.5MM-J .035X260CM (WIRE) ×1 IMPLANT

## 2014-08-03 NOTE — H&P (View-Only) (Signed)
PCP: Annye Asa, MD  Clinic Note: Chief Complaint  Patient presents with  . Follow-up    chest pain-has had chest pain,pressure and tightness, occassional shortness of breath, no edema, no pain in legs, no cramping in legs,has had lightheadedness, has had dizziness  . Chest Pain  . Coronary Artery Disease    HPI: [name] [name] is a 74 y.o. male with a PMH below who presents today for second post hospital followup. He has a long-standing history of CAD status post CABG/redo CABG and PCI. Most recently he has had multiple interventions on the RCA. He was asked to be admitted on April 11 and underwent cardiac catheterization following day with PTCA of in-stent restenosis in the triple layers that area of the mid RCA. Unfortunately, each attempted intervention on the RCA requires use of a guideline her just to simply get balloon downstream. Initially post PCI he felt quite well for about 2-3 days and is now back to his baseline use of nitroglycerin. He was relatively well-controlled however until the last month.  For additional treatment he was laced on twice a day Plavix as well as Pletal. Unfortunately, we were not able to find a location for him to relatively nearby for brachial therapy. I referred him back to Dr. Prescott Gum to explore other redo CABG options but this was as expected, unfruitful  Past Medical History  Diagnosis Date  . Diabetes mellitus   . Hyperlipidemia   . Hypertension   . Gout   . Hypothyroidism     On supplementation  . LBBB (left bundle branch block)     Chronic  . Anemia     Likely secondary to history of GI bleed  . Stroke 1998  . CAD in native artery; and the grafts 1992, 1997, 2002, 2006, 2012    CABG 1992 and redo CABG 2006  . CAD (coronary artery disease) of bypass graft 2006, 2012    In 2006: Occluded SVG-OM noted (SVG-diagonal was previously occluded); 2012: Severe lesion in redo SVG-OM1 --> BMS PCI   . CAD S/P percutaneous coronary angioplasty  October 2012; Feb, Apr & Oct 2015; Feb 2016    a) s/p CABG 1992 and redo 2006. b) 10/'12: NSTEMI: PCI to SVG-OM1 Integrity BMS 3.5 x 15 (3.85 mm) c) 2/'15: UA - PCI mid RCA Xience Ap DES 2.75 x 15 (3.1 mm). d) 4/'15 : UA - focal dRCA Resolute DES 2.25 x 8 (2.5 mm). d) 10/'15: PCI to mRCA ISR w/ post stent lesion - Promus P 2.75 x 16 (3.25 mm). e) 2/'16: NSTEMI: Cutting Balloon PTCA of  mRCA ISR (3.5 mm post-dilation).  . Ischemic cardiomyopathy     a) EF 45-50% by echo 2015. b) EF 50% by cath 04/2014.  Marland Kitchen History of Non-ST elevated myocardial infarction (non-STEMI) 1992, 2006, 2012; 02/2013  . Peptic ulcer disease  2004  . BPH (benign prostatic hyperplasia)   . Chronic low back pain   . Carotid arterial disease     a) Duplex 05/2012: R BULB/PROX ICA: 50-69%, L BULB/PROX ICA: 0-49%. ;; 04/29/2014: [id number] RICA, [id number] LICA. Patetn vertebrals,  . Colon cancer 2003    colectomy for CA. no recurrence . never required  radiation or chem  . End stage renal disease on dialysis     Dialyzes at [facility]: Tues/Th/Sat - left upper cavity AV fistula brachiocephalic  . Plavix resistance     a) P2Y12 264 in 02/2014 - put on Brilinta but did not tolerate  due to SOB. b) cannot be on Effient due to history of stroke. c) Plavix increased to 75mg  BID and Pletal added 04/2014 due to ISR.    Prior Cardiac Evaluation and Past Surgical History:reviewed in Epic  Interval History: Unfortunately, [name] [name] was doing well after his last PTCA up until about the last 2 weeks and is now been having class 3-4 anginal symptoms yet again. Occasionally occurring at rest or with minimal exertion. He started having it during dialysis now for the first time. Limited to doing absolutely nothing besides sitting around the house due to limitation from his symptoms. He is taking almost a whole bottle of nitroglycerin course of a week. He has increased his Imdur dose to double. He denies any heart failure symptoms of PND, orthopnea or  edema. He does however have exertional dyspnea that is becoming worse. No significant edema since he is on dialysis. He denies having any palpitations or rapid/irregular heartbeats. No syncope/near syncope or TIA symptoms he gets symptoms.  No melena, hematochezia, hematuria, or epstaxis. No claudication.  ROS: A comprehensive was performed. Review of Systems  Constitutional: Positive for malaise/fatigue (Very worn-out).  HENT: Negative for nosebleeds.   Respiratory: Positive for shortness of breath (Pretty much constant). Negative for cough.   Cardiovascular: Negative for claudication and leg swelling.  Gastrointestinal: Positive for heartburn, diarrhea and constipation (had previously had constipation now has diarrhea). Negative for blood in stool and melena.  Genitourinary: Negative for hematuria.  Musculoskeletal: Positive for joint pain. Negative for falls.  Neurological: Positive for dizziness (Mostly positional but some vertigo-type symptoms).  Endo/Heme/Allergies: Bruises/bleeds easily.  Psychiatric/Behavioral: Positive for depression (Because he just can't do anything).  All other systems reviewed and are negative.   Current Outpatient Prescriptions on File Prior to Visit  Medication Sig Dispense Refill  . aspirin 81 MG chewable tablet Chew 1 tablet (81 mg total) by mouth daily.    Marland Kitchen atorvastatin (LIPITOR) 40 MG tablet Take 1 tablet (40 mg total) by mouth at bedtime. 90 tablet 1  . Besifloxacin HCl (BESIVANCE) 0.6 % SUSP Place 1 drop into both eyes See admin instructions. Use eye drops 4 times daily for 2 days following injection by Dr. Zigmund Daniel (once a month)    . carvedilol (COREG) 25 MG tablet Take 1 tablet (25 mg total) by mouth 2 (two) times daily with a meal. 180 tablet 1  . cilostazol (PLETAL) 100 MG tablet Take 1 tablet (100 mg total) by mouth 2 (two) times daily. 180 tablet 3  . clopidogrel (PLAVIX) 75 MG tablet Take 1 tablet (75 mg total) by mouth 2 (two) times daily.  60 tablet 6  . glucose blood (PRODIGY NO CODING BLOOD GLUC) test strip Test as directed 100 each 6  . insulin NPH Human (HUMULIN N,NOVOLIN N) 100 UNIT/ML injection Inject 10-20 Units into the skin 2 (two) times daily. Take 10 units if blood sugar is 130-150.  Take 20 units if blood sugar is >150.    Marland Kitchen isosorbide mononitrate (IMDUR) 120 MG 24 hr tablet Take 1 tablet (120 mg total) by mouth daily. 90 tablet 3  . levothyroxine (SYNTHROID, LEVOTHROID) 125 MCG tablet TAKE ONE TABLET BY MOUTH ONCE DAILY BEFORE BREAKFAST 90 tablet 1  . loperamide (IMODIUM) 2 MG capsule Take 2 mg by mouth daily as needed for diarrhea or loose stools.     . multivitamin (RENA-VIT) TABS tablet Take 1 tablet by mouth daily. 90 tablet 3  . nitroGLYCERIN (NITROSTAT) 0.4 MG SL tablet Place 1  tablet (0.4 mg total) under the tongue every 5 (five) minutes as needed for chest pain. 100 tablet 4  . sevelamer carbonate (RENVELA) 800 MG tablet Take 3(three)  800mg  tablets by mouth with every meal.     No current facility-administered medications on file prior to visit.   Allergies  Allergen Reactions  . Brilinta [Ticagrelor]     Shortness of breath  . Shellfish Allergy     Causes gout flare-ups    History  Substance Use Topics  . Smoking status: Never Smoker   . Smokeless tobacco: Never Used  . Alcohol Use: No   Family History  Problem Relation Age of Onset  . Heart disease Mother   . Hypertension Mother   . Diabetes Mother   . Heart disease Father   . Hypertension Father   . Diabetes Father   . Heart attack Mother   . Heart attack Father   . Stroke Paternal Aunt   . Stroke Paternal Uncle     Wt Readings from Last 3 Encounters:  08/02/14 71.328 kg (157 lb 4 oz)  07/23/14 71.215 kg (157 lb)  07/21/14 71.328 kg (157 lb 4 oz)    PHYSICAL EXAM BP 102/62 mmHg  Pulse 77  Ht 5\' 5"  (1.651 m)  Wt 71.328 kg (157 lb 4 oz)  BMI 26.17 kg/m2 General appearance: alert, cooperative, appears stated age, no distress;  and Borderline obese. Chronically ill-appearing, pale, well groomed.Marland Kitchen answers questions appropriately.  Neck: no adenopathy, no carotid bruit, mild JVD; Supple, symmetrical, trachea midline  Lungs: CTAB, normal percussion bilaterally and Nonlabored, good air movement  Heart: normal apical impulse, RRR, S1, S2 normal, S4 present, SEM 2/6, crescendo, decrescendo and harsh at 2nd right intercostal space, no click and no rub  Abdomen: soft, non-tender; bowel sounds normal; no masses, no organomegaly and Mild truncal obesity  Extremities: extremities normal, atraumatic, no cyanosis or edema and fistula in the left upper arm with good thrill  Pulses: 2+ and symmetric  Neurologic: Grossly normal Pscyh;somewhat depressed mood & affect    Adult ECG Report - normal sinus rhythm, 77 BPM, nonspecific IVCD. Essentially stable. QTc is slightly prolonged at 509. Otherwise normal intervals. Axis is rightward. Nonspecific ST segment changes in inferior lateral leads that is stable.  Recent Labs:    Lab Results  Component Value Date   CHOL 71 07/21/2014   HDL 28.70* 07/21/2014   LDLCALC 22 07/21/2014   LDLDIRECT 33.5 12/22/2012   TRIG 104.0 07/21/2014   CHOLHDL 2 07/21/2014    ASSESSMENT / PLAN: Very unfortunate situation with already having redo CABG and both graft conduits to the RCA system have been occluded. He has recurrent in-stent restenosis involving the mid RCA. He has had multiple interventions on the RCA with the stents or to the balloon angioplasty with cutting balloon for support wounds.  He was seen by Dr. Prescott Gum from cardiac surgery who as expected do not believe that he has any surgical options available.     Problem List Items Addressed This Visit    Angina, class III - Primary    At this point he presents yet again with class 3-4 angina. Unfortunately he has essentially intractable, life limiting angina now and the only option I can see is to go back for PTCA. I am reviewing  the literature to determine if there is any other treatment option including rotational atherectomy. I think, the best option for now is to potentially do redo PTCA for symptom relief and  then bring him back for direct rotational atherectomy with PTCA if this is thought to be an option after reviewing with colleagues.      CAD (coronary artery disease) of bypass graft - PCI to SVG-OM with BMS; PCI-mid RCA with a Xience DES (Chronic)    He is on twice a day Plavix and Pletal as well as aspirin, statin, Coreg and Imdur that he has increased the dose.  Plan:relook catheterization tomorrow using a femoral approach. If I do see some in-stent restenosis so likely balloon for symptom relief but then would consider returning him for rotational atherectomy that would need to be organized device rep in place. I've discussed with colleagues and have searched literature. It would be nice if we had drug coated balloons, however we do not have are present. There is no location nearby that conducts brachitherapy.      Relevant Orders   EKG 12-Lead   LEFT HEART CATHETERIZATION WITH CORONARY/GRAFT ANGIOGRAM   Basic metabolic panel   CBC   APTT   Protime-INR   CAD S/P percutaneous coronary angioplasty (Chronic)    With multiple stents overlapping and in-stent restenosis, and we've I would love to really use IVUS, however based on the fact with the use guidelines are to get a balloon downstream, don't mother get IVUS downstream. This may also limit our ability to use rotational atherectomy for debulking.      Relevant Orders   EKG 12-Lead   LEFT HEART CATHETERIZATION WITH CORONARY/GRAFT ANGIOGRAM   Basic metabolic panel   CBC   APTT   Protime-INR   Chronic combined systolic and diastolic congestive heart failure, NYHA class 2 (Chronic)   Relevant Orders   EKG 12-Lead   LEFT HEART CATHETERIZATION WITH CORONARY/GRAFT ANGIOGRAM   Basic metabolic panel   CBC   APTT   Protime-INR   Coronary artery disease  involving coronary bypass graft of native heart with unspecified angina pectoris (Chronic)   Relevant Orders   EKG 12-Lead   LEFT HEART CATHETERIZATION WITH CORONARY/GRAFT ANGIOGRAM   Basic metabolic panel   CBC   APTT   Protime-INR   Diabetes mellitus with neuropathy (Chronic)   End-stage renal disease on hemodialysis   Essential hypertension (Chronic)    Stable, controlled. Does still have some issues with hypotension during dialysis. Would not further titrate medications.      Hyperlipidemia with target LDL less than 70 (Chronic)    Control. On statin. Labs reviewed.      Plavix resistance   Presence of drug coated stent in right coronary artery (Chronic)    Other Visit Diagnoses    Blood clotting disorder        Relevant Orders    APTT    Protime-INR    Pre-op examination           Orders Placed This Encounter  Procedures  . Basic metabolic panel  . CBC  . APTT  . Protime-INR  . EKG 12-Lead  . LEFT HEART CATHETERIZATION WITH CORONARY/GRAFT ANGIOGRAM   No orders of the defined types were placed in this encounter.     Followup: Post cath    Leonie Man, M.D., M.S. Interventional Cardiologist   Pager # [phone]

## 2014-08-03 NOTE — Interval H&P Note (Signed)
History and Physical Interval Note:  08/03/2014 3:05 PM  [name] [name]  has presented today for surgery, with the diagnosis of Class III - (IV) Angina with known CAD status post redo CABG and extensive PCI/PTCA.  The various methods of treatment have been discussed with the patient and family. After consideration of risks, benefits and other options for treatment, the patient has consented to  Procedure(s): Left Heart Cath and Cors/Grafts Angiography (N/A) with likely PCI as a surgical intervention .  The patient's history has been reviewed, patient examined, no change in status, stable for surgery.  I have reviewed the patient's chart and labs.  Questions were answered to the patient's satisfaction.     [place], [place]  Cath Lab Visit (complete for each Cath Lab visit)  Clinical Evaluation Leading to the Procedure:   ACS: No.  Non-ACS:    Anginal Classification: CCS III  Anti-ischemic medical therapy: Maximal Therapy (2 or more classes of medications)  Non-Invasive Test Results: No non-invasive testing performed  Prior CABG: Previous CABG   SCAI - score could not be calculated with his history

## 2014-08-03 NOTE — Progress Notes (Signed)
Patient's femoral sheath removed at 2050. Manual pressure applied to site for 20 minutes. No signs of bleeding or hematoma. Pedal pulse +2, VSS, no c/o pain. Clean pressure dressing applied to site. Will continue to monitor patient.

## 2014-08-03 NOTE — Progress Notes (Signed)
eLink Physician-Brief Progress Note Patient Name: [name] [name] DOB: 10-Sep-1940 MRN: [id number]   Date of Service  08/03/2014  HPI/Events of Note  74 yo male with PMH of CAD - s/p CABG X 2 and multiple stent placements, DM, ESRD on HD, HTN, LBBB, CVA. Admitted for cardiac cath which was not successful. Cardiac Surgery has been consulted. Medical management includes ASA, Lipator, Pletal, Plavix, Imdur, Coreg and NTG SL PRN. Management per Cardiology.   eICU Interventions  Continue present management.      Intervention Category Evaluation Type: New Patient Evaluation  Lysle Dingwall 08/03/2014, 10:52 PM

## 2014-08-04 ENCOUNTER — Encounter (HOSPITAL_COMMUNITY): Payer: Self-pay | Admitting: Cardiology

## 2014-08-04 DIAGNOSIS — I2510 Atherosclerotic heart disease of native coronary artery without angina pectoris: Secondary | ICD-10-CM

## 2014-08-04 DIAGNOSIS — T82857A Stenosis of cardiac prosthetic devices, implants and grafts, initial encounter: Secondary | ICD-10-CM | POA: Diagnosis not present

## 2014-08-04 DIAGNOSIS — Z9861 Coronary angioplasty status: Secondary | ICD-10-CM

## 2014-08-04 DIAGNOSIS — Z955 Presence of coronary angioplasty implant and graft: Secondary | ICD-10-CM | POA: Diagnosis not present

## 2014-08-04 DIAGNOSIS — I2584 Coronary atherosclerosis due to calcified coronary lesion: Secondary | ICD-10-CM | POA: Diagnosis not present

## 2014-08-04 DIAGNOSIS — I25119 Atherosclerotic heart disease of native coronary artery with unspecified angina pectoris: Secondary | ICD-10-CM | POA: Diagnosis not present

## 2014-08-04 DIAGNOSIS — Z951 Presence of aortocoronary bypass graft: Secondary | ICD-10-CM | POA: Diagnosis not present

## 2014-08-04 DIAGNOSIS — I2582 Chronic total occlusion of coronary artery: Secondary | ICD-10-CM | POA: Diagnosis not present

## 2014-08-04 LAB — GLUCOSE, CAPILLARY
Glucose-Capillary: 93 mg/dL (ref 65–99)
Glucose-Capillary: 152 mg/dL — ABNORMAL HIGH (ref 65–99)

## 2014-08-04 LAB — BASIC METABOLIC PANEL
Anion gap: 10 (ref 5–15)
BUN: 17 mg/dL (ref 6–20)
CALCIUM: 8.9 mg/dL (ref 8.9–10.3)
CO2: 22 mmol/L (ref 22–32)
Chloride: 102 mmol/L (ref 101–111)
Creatinine, Ser: 3.42 mg/dL — ABNORMAL HIGH (ref 0.61–1.24)
GFR calc Af Amer: 19 mL/min — ABNORMAL LOW (ref >60)
GFR, EST NON AFRICAN AMERICAN: 16 mL/min — AB (ref >60)
Glucose, Bld: 115 mg/dL — ABNORMAL HIGH (ref 65–99)
Potassium: 4.1 mmol/L (ref 3.5–5.1)
Sodium: 134 mmol/L — ABNORMAL LOW (ref 135–145)

## 2014-08-04 LAB — CBC
HCT: 32.4 % — ABNORMAL LOW (ref 39.0–52.0)
Hemoglobin: 10.8 g/dL — ABNORMAL LOW (ref 13.0–17.0)
MCH: 31.6 pg (ref 26.0–34.0)
MCHC: 33.3 g/dL (ref 30.0–36.0)
MCV: 94.7 fL (ref 78.0–100.0)
Platelets: 202 10*3/uL (ref 150–400)
RBC: 3.42 MIL/uL — ABNORMAL LOW (ref 4.22–5.81)
RDW: 15.5 % (ref 11.5–15.5)
WBC: 7 10*3/uL (ref 4.0–10.5)

## 2014-08-04 MED FILL — Lidocaine HCl Local Preservative Free (PF) Inj 1%: INTRAMUSCULAR | Qty: 30 | Status: AC

## 2014-08-04 MED FILL — Heparin Sodium (Porcine) 2 Unit/ML in Sodium Chloride 0.9%: INTRAMUSCULAR | Qty: 500 | Status: AC

## 2014-08-04 NOTE — Progress Notes (Signed)
CARDIAC REHAB PHASE I   PRE:  Rate/Rhythm: 69 SR    BP: sitting 138/44    SaO2:   MODE:  Ambulation: 700 ft   POST:  Rate/Rhythm: 82 SR    BP: sitting 152/48     SaO2:   Tolerated well, no c/o, feels good. Reviewed ed with good understanding. Has declined CRPII multiple times in the past. Encouraged him to ambulate more at home for aerobic ex. [id number]   Josephina Shih [place] CES, ACSM 08/04/2014 10:36 AM

## 2014-08-04 NOTE — Progress Notes (Signed)
D/c instructions given. Pt verbalizes understanding. PIVS d/c'd catheter tip intact. Pt taken to car via [place]. No complaints at the current time.

## 2014-08-04 NOTE — Discharge Summary (Signed)
Discharge Summary   Patient ID: [name] [name],  MRN: [id number], DOB/AGE: 04-03-1940 74 y.o.  Admit date: 08/03/2014 Discharge date: 08/04/2014  Primary Care Provider: Annye Asa Primary Cardiologist: Roni Bread, MD   Discharge Diagnoses Principal Problem:   Angina, class III  **s/p PCI and cutting balloon angioplasty for in-stent restenosis within the RCA this admission.  Active Problems:   CAD (coronary artery disease) of bypass graft - PCI to SVG-OM with BMS; PCI-mid RCA with a Xience DES   CAD S/P percutaneous coronary angioplasty   Coronary artery disease involving coronary bypass graft of native heart with unspecified angina pectoris   Essential hypertension   End stage renal disease - on hemodialysis   Hypothyroidism   Hyperlipidemia with target LDL less than 70   Presence of drug coated stent in right coronary artery   Allergies Allergies  Allergen Reactions  . Brilinta [Ticagrelor] Shortness Of Breath  . Shellfish Allergy Other (See Comments)    Causes gout flare-ups    Procedures  Cardiac Catheterization and Percutaneous Coronary Intervention   Coronary Findings     Dominance: Right    Left Main  The vessel was injected is normal in caliber . JL4 catheter Diffusely calcified      Left Anterior Descending  The vessel is normal in caliber . Diffusely calcified   . Mid LAD lesion, 100% stenosed. severely calcified chronic total occlusion .   Marland Kitchen Mid LAD to Dist LAD lesion, 80% stenosed. diffuse .   Marland Kitchen Dist LAD lesion, 40% stenosed. discrete .   Marland Kitchen First Diagonal Branch   The vessel is small in size.   . 1st Diag lesion, 70% stenosed. diffuse .   Marland Kitchen First Septal Branch   The vessel is moderate in size.   . Third Diagonal Branch   The vessel is small in size.      Ramus Intermedius  The vessel is small .      Left Circumflex   . Ost Cx to Dist Cx lesion, 100% stenosed. calcified diffuse . Bypass graft   . Second Obtuse Marginal Branch   The vessel  is moderate in size.   . 2nd Mrg lesion, 40% stenosed. diffuse .   Marland Kitchen Lateral Second Obtuse Marginal Branch   The vessel is small in size and is angiographically normal.      Right Coronary Artery   . Ost RCA lesion, 40% stenosed. discrete .   Marland Kitchen Prox RCA lesion, 40% stenosed. calcified diffuse eccentric .   Marland Kitchen Mid RCA lesion, 99% stenosed. discrete tubular eccentric . In-stent restenosis The lesion was previously treated with a stent (unknown type) and angioplasty between one and five months ago. Several interventions the last 3 years Initially BMS followed by 2 additional DES stents overlapping distally and covering the entire BMS. Multiple PTCA only Very focal lesion at the RV marginal branch   . **The RCA was successfully treated using cutting balloon angioplasty.  . There is a 20% residual stenosis post intervention.     . Dist RCA lesion, 5% stenosed. The lesion was previously treated with a drug-eluting stent greater than two years ago.   . Acute Marginal Branch   The vessel is small in size.   . Right Posterior Descending Artery   The vessel is small in size. (Small to moderate)   . RPDA lesion, 60% stenosed. tubular .   Marland Kitchen Inferior Septal   The vessel is small in size.   . Right Posterior Atrioventricular Branch  The vessel is small in size.      Graft Angiography     Free Graft to 2nd Mrg  SVG is large. JR4 The graft exhibits minimal luminal irregularities.      Free LIMA Graft to Dist LAD  LIMA was injected is large, and is anatomically normal. IMA catheter There is disease in the graft. Tortuous Retrograde flow fills through a 90% stenosis more proximally to the moderate caliber diagonal branch. Antegrade flow reveals distal LAD disease as indicated.       History of Present Illness  74 y.o. male with long-standing CAD-CABG (with re-do CABG in 2006) who has had BMS PCI to SVG-OM and extensive PCI to native RCA from 2012-2015 - most recently with new stent placed for distal  ISR lesion in the mid RCA for Unstable Angina.  The RCA BMS has now been overlapped with at least 2 DES stents. The more distal DES stent in the RCA. He has had now had several admissions for ACS/unstable angina or non-STEMI with PTCA of in-stent restenosis in February and March of 2016 and was seen in clinic on 7/25 with complaints of recurrent class III to IV angina.  Decision was made to pursue repeat cath.  Hospital Course  Pt presented to the [facility] cardiac catheterization laboratory on 7/26, following outpt dialysis that morning.  He underwent diagnostic catheterization revealing recurrent, severe, in-stent restenosis within the RCA.  Anatomy was otherwise stable anatomy and patent grafts to the OM2 and distal LAD.  The RCA was successfully treated using cutting balloon angioplasty with residual 20% stenosis following the procedure.  Mr. Frerichs tolerated the procedure well and post-procedure, has been ambulating without recurrent symptoms or limitations.  He will be discharged home today in good condition and will have outpatient dialysis tomorrow.  Discharge Vitals Blood pressure 88/67, pulse 69, temperature 97.8 F (36.6 C), temperature source Oral, resp. rate 14, height 5\' 5"  (1.651 m), weight 156 lb 4.9 oz (70.9 kg), SpO2 97 %.  Filed Weights   08/03/14 1304 08/03/14 1745  Weight: 185 lb (83.915 kg) 156 lb 4.9 oz (70.9 kg)    Labs  CBC  Recent Labs  08/03/14 1810 08/04/14 0230  WBC 6.8 7.0  HGB 10.8* 10.8*  HCT 32.6* 32.4*  MCV 93.7 94.7  PLT 210 123XX123   Basic Metabolic Panel  Recent Labs  08/03/14 1810 08/04/14 0230  NA 136 134*  K 3.8 4.1  CL 100* 102  CO2 25 22  GLUCOSE 92 115*  BUN 12 17  CREATININE 2.95* 3.42*  CALCIUM 8.9 8.9   Disposition  Pt is being discharged home today in good condition.  Follow-up Plans & Appointments  Follow-up Information    Follow up with HAGER, BRYAN, PA-C On 08/18/2014.   Specialties:  Physician Assistant, Radiology,  Interventional Cardiology   Why:  8:00 AM - Dr. Allison Quarry PA.   Contact information:   [place] [id number] [phone]       Follow up with Annye Asa, MD.   Specialty:  Family Medicine   Why:  as scheduled.   Contact information:   [address] [place] [address] [phone]       Discharge Medications    Medication List    TAKE these medications        aspirin 81 MG chewable tablet  Chew 1 tablet (81 mg total) by mouth daily.     atorvastatin 40 MG  tablet  Commonly known as:  LIPITOR  Take 1 tablet (40 mg total) by mouth at bedtime.     BESIVANCE 0.6 % Susp  Generic drug:  Besifloxacin HCl  Place 1 drop into both eyes See admin instructions. Use eye drops 4 times daily for 2 days following injection by Dr. Zigmund Daniel (every 10 weeks)     carvedilol 25 MG tablet  Commonly known as:  COREG  Take 1 tablet (25 mg total) by mouth 2 (two) times daily with a meal.     cilostazol 100 MG tablet  Commonly known as:  PLETAL  Take 1 tablet (100 mg total) by mouth 2 (two) times daily.     clopidogrel 75 MG tablet  Commonly known as:  PLAVIX  Take 1 tablet (75 mg total) by mouth 2 (two) times daily.     glucose blood test strip  Commonly known as:  PRODIGY NO CODING BLOOD GLUC  Test as directed     insulin NPH Human 100 UNIT/ML injection  Commonly known as:  HUMULIN N,NOVOLIN N  Inject 10-20 Units into the skin 2 (two) times daily as needed (CBG >130). CBG 130-150 inject 10 units, >150 inject 20 units     isosorbide mononitrate 120 MG 24 hr tablet  Commonly known as:  IMDUR  Take 1 tablet (120 mg total) by mouth daily.     levothyroxine 125 MCG tablet  Commonly known as:  SYNTHROID, LEVOTHROID  TAKE ONE TABLET BY MOUTH ONCE DAILY BEFORE BREAKFAST     lidocaine-prilocaine cream  Commonly known as:  EMLA  Apply 1 application topically See admin instructions. Apply prior to dialysis treatments on Tuesday, Thursday  and Saturday     loperamide 2 MG tablet  Commonly known as:  IMODIUM A-D  Take 4 mg by mouth 2 (two) times daily as needed for diarrhea or loose stools.     multivitamin Tabs tablet  Take 1 tablet by mouth daily.     nitroGLYCERIN 0.4 MG SL tablet  Commonly known as:  NITROSTAT  Place 1 tablet (0.4 mg total) under the tongue every 5 (five) minutes as needed for chest pain.     sevelamer carbonate 800 MG tablet  Commonly known as:  RENVELA  Take 1,600-2,400 mg by mouth See admin instructions. Take 3 tablets (2400 mg) with meals and 2 tablets (1600 mg) with snacks       Outstanding Labs/Studies  None  Duration of Discharge Encounter   Greater than 30 minutes including physician time.  Signed, Murray Hodgkins NP 08/04/2014, 12:52 PM Patient seen and examined. I agree with the assessment and plan as detailed above. See also my additional thoughts below.  The patient is ready to go. I agree completely with the notes and plans as outlined.  Dola Argyle, MD, [facility] 08/04/2014 1:02 PM

## 2014-08-04 NOTE — Discharge Instructions (Signed)
**  PLEASE REMEMBER TO BRING ALL OF YOUR MEDICATIONS TO EACH OF YOUR FOLLOW-UP OFFICE VISITS. ° °NO HEAVY LIFTING OR SEXUAL ACTIVITY X 7 DAYS. °NO DRIVING X 3-5 DAYS. °NO SOAKING BATHS, HOT TUBS, POOLS, ETC., X 7 DAYS. ° °Groin Site Care °Refer to this sheet in the next few weeks. These instructions provide you with information on caring for yourself after your procedure. Your caregiver may also give you more specific instructions. Your treatment has been planned according to current medical practices, but problems sometimes occur. Call your caregiver if you have any problems or questions after your procedure. °HOME CARE INSTRUCTIONS °· You may shower 24 hours after the procedure. Remove the bandage (dressing) and gently wash the site with plain soap and water. Gently pat the site dry.  °· Do not apply powder or lotion to the site.  °· Do not sit in a bathtub, swimming pool, or whirlpool for 5 to 7 days.  °· No bending, squatting, or lifting anything over 10 pounds (4.5 kg) as directed by your caregiver.  °· Inspect the site at least twice daily.  °· Do not drive home if you are discharged the same day of the procedure. Have someone else drive you.  ° °What to expect: °· Any bruising will usually fade within 1 to 2 weeks.  °· Blood that collects in the tissue (hematoma) may be painful to the touch. It should usually decrease in size and tenderness within 1 to 2 weeks.  °SEEK IMMEDIATE MEDICAL CARE IF: °· You have unusual pain at the groin site or down the affected leg.  °· You have redness, warmth, swelling, or pain at the groin site.  °· You have drainage (other than a small amount of blood on the dressing).  °· You have chills.  °· You have a fever or persistent symptoms for more than 72 hours.  °· You have a fever and your symptoms suddenly get worse.  °· Your leg becomes pale, cool, tingly, or numb.  °You have heavy bleeding from the site. Hold pressure on the site. . ° °

## 2014-08-04 NOTE — Progress Notes (Signed)
Patient Name: [name] [name] Date of Encounter: 08/04/2014     Principal Problem:   Angina, class III Active Problems:   CAD (coronary artery disease) of bypass graft - PCI to SVG-OM with BMS; PCI-mid RCA with a Xience DES   CAD S/P percutaneous coronary angioplasty   Coronary artery disease involving coronary bypass graft of native heart with unspecified angina pectoris   Essential hypertension   End stage renal disease - on hemodialysis   Hypothyroidism   Hyperlipidemia with target LDL less than 70   Presence of drug coated stent in right coronary artery    SUBJECTIVE  No c/p or dyspnea overnight.  Eager to go home today.  CURRENT MEDS . aspirin  81 mg Oral QHS  . atorvastatin  40 mg Oral QHS  . carvedilol  25 mg Oral BID WC  . cilostazol  100 mg Oral BID  . clopidogrel  75 mg Oral BID  . insulin NPH Human  10-20 Units Subcutaneous BID AC & HS  . isosorbide mononitrate  120 mg Oral Daily  . levothyroxine  125 mcg Oral QAC breakfast  . multivitamin  1 tablet Oral Daily  . sevelamer carbonate  2,400 mg Oral TID WC  . sodium chloride  3 mL Intravenous Q12H  . sodium chloride  3 mL Intravenous Q12H    OBJECTIVE  Filed Vitals:   08/04/14 0600 08/04/14 0742 08/04/14 0800 08/04/14 1217  BP: 125/30 158/52 160/53 88/67  Pulse: 63 64 65 69  Temp:  97.8 F (36.6 C)    TempSrc:  Oral    Resp: 17 11 16 14   Height:      Weight:      SpO2: 94% 96% 97%     Intake/Output Summary (Last 24 hours) at 08/04/14 1229 Last data filed at 08/04/14 0857  Gross per 24 hour  Intake    263 ml  Output      1 ml  Net    262 ml   Filed Weights   08/03/14 1304 08/03/14 1745  Weight: 185 lb (83.915 kg) 156 lb 4.9 oz (70.9 kg)    PHYSICAL EXAM  General: Pleasant, NAD. Neuro: Alert and oriented X 3. Moves all extremities spontaneously. Psych: Normal affect. HEENT:  Normal  Neck: Supple without bruits or JVD. Lungs:  Resp regular and unlabored, bibasilar crackles. Heart: RRR no  s3, s4, or murmurs. Abdomen: Soft, non-tender, non-distended, BS + x 4.  Extremities: No clubbing, cyanosis or edema. DP/PT/Radials 2+ and equal bilaterally. R groin cath site w/o bleeding/bruit/hematoma.  Accessory Clinical Findings  CBC  Recent Labs  08/03/14 1810 08/04/14 0230  WBC 6.8 7.0  HGB 10.8* 10.8*  HCT 32.6* 32.4*  MCV 93.7 94.7  PLT 210 123XX123   Basic Metabolic Panel  Recent Labs  08/03/14 1810 08/04/14 0230  NA 136 134*  K 3.8 4.1  CL 100* 102  CO2 25 22  GLUCOSE 92 115*  BUN 12 17  CREATININE 2.95* 3.42*  CALCIUM 8.9 8.9   TELE  rsr  ECG  Rsr, 66, lbbb.  Radiology/Studies  No results found.  ASSESSMENT AND PLAN  1.  Angina Class III/CAD:  S/p cath yesterday, again revealing severe RCA ISR, now s/p PTCA.  No c/p or dyspnea overnight.  Ambulating w/o difficulty.  D/c today with early f/u in next 2 wks.  Cont asa, statin, bb, plavix, pletal, nitrate.  2.  ESRD:  Had dialysis pre pci yesterday.  Scheduled for dialysis in AM.  3.  Essential HTN:  Stable.  4.  HL:  Cont statin therapy.  Signed, Murray Hodgkins NP Patient seen and examined. I agree with the assessment and plan as detailed above. See also my additional thoughts below.   The patient is stable and doing well. I'm in agreement with him going home today. I agree with the note above and all the plans.  Dola Argyle, MD, [facility] 08/04/2014 12:58 PM

## 2014-08-05 ENCOUNTER — Telehealth: Payer: Self-pay | Admitting: *Deleted

## 2014-08-05 DIAGNOSIS — N186 End stage renal disease: Secondary | ICD-10-CM | POA: Diagnosis not present

## 2014-08-05 DIAGNOSIS — D631 Anemia in chronic kidney disease: Secondary | ICD-10-CM | POA: Diagnosis not present

## 2014-08-05 DIAGNOSIS — N2581 Secondary hyperparathyroidism of renal origin: Secondary | ICD-10-CM | POA: Diagnosis not present

## 2014-08-05 DIAGNOSIS — E1129 Type 2 diabetes mellitus with other diabetic kidney complication: Secondary | ICD-10-CM | POA: Diagnosis not present

## 2014-08-05 NOTE — Telephone Encounter (Signed)
Transition Care Management Follow-up Telephone Call  Date discharged? 08/04/14   How have you been since you were released from the hospital? "OK"  Denies chest pain, shortness of breath, dizziness, any events    Do you understand why you were in the hospital? Yes- heart catheterization    Do you understand the discharge instructions? YES    Where were you discharged to? Home with family    Items Reviewed:  Medications reviewed: Yes- no changes   Allergies reviewed: Yes   Dietary changes reviewed: YES- low-sodium heart healthy diet   Referrals reviewed: YES    Functional Questionnaire:   Activities of Daily Living (ADLs):   He states they are independent in the following: ambulation, feeding, restroom, medications (family is helping)  States they require assistance with the following: none   Any transportation issues/concerns?: NO    Any patient concerns? NO   Confirmed importance and date/time of follow-up visits scheduled 08/12/14  Provider Appointment booked with Dr. Birdie Riddle  Confirmed with patient if condition begins to worsen call PCP or go to the ER.  Patient was given the office number and encouraged to call back with question or concerns.  : YES

## 2014-08-06 ENCOUNTER — Encounter (INDEPENDENT_AMBULATORY_CARE_PROVIDER_SITE_OTHER): Payer: Medicare Other | Admitting: Ophthalmology

## 2014-08-06 DIAGNOSIS — H35033 Hypertensive retinopathy, bilateral: Secondary | ICD-10-CM | POA: Diagnosis not present

## 2014-08-06 DIAGNOSIS — I10 Essential (primary) hypertension: Secondary | ICD-10-CM

## 2014-08-06 DIAGNOSIS — E11331 Type 2 diabetes mellitus with moderate nonproliferative diabetic retinopathy with macular edema: Secondary | ICD-10-CM

## 2014-08-06 DIAGNOSIS — H43813 Vitreous degeneration, bilateral: Secondary | ICD-10-CM | POA: Diagnosis not present

## 2014-08-06 DIAGNOSIS — E11311 Type 2 diabetes mellitus with unspecified diabetic retinopathy with macular edema: Secondary | ICD-10-CM

## 2014-08-06 DIAGNOSIS — E11339 Type 2 diabetes mellitus with moderate nonproliferative diabetic retinopathy without macular edema: Secondary | ICD-10-CM

## 2014-08-07 DIAGNOSIS — D631 Anemia in chronic kidney disease: Secondary | ICD-10-CM | POA: Diagnosis not present

## 2014-08-07 DIAGNOSIS — N186 End stage renal disease: Secondary | ICD-10-CM | POA: Diagnosis not present

## 2014-08-07 DIAGNOSIS — N2581 Secondary hyperparathyroidism of renal origin: Secondary | ICD-10-CM | POA: Diagnosis not present

## 2014-08-07 DIAGNOSIS — E1129 Type 2 diabetes mellitus with other diabetic kidney complication: Secondary | ICD-10-CM | POA: Diagnosis not present

## 2014-08-08 DIAGNOSIS — E1129 Type 2 diabetes mellitus with other diabetic kidney complication: Secondary | ICD-10-CM | POA: Diagnosis not present

## 2014-08-08 DIAGNOSIS — N186 End stage renal disease: Secondary | ICD-10-CM | POA: Diagnosis not present

## 2014-08-08 DIAGNOSIS — Z992 Dependence on renal dialysis: Secondary | ICD-10-CM | POA: Diagnosis not present

## 2014-08-10 DIAGNOSIS — D631 Anemia in chronic kidney disease: Secondary | ICD-10-CM | POA: Diagnosis not present

## 2014-08-10 DIAGNOSIS — N186 End stage renal disease: Secondary | ICD-10-CM | POA: Diagnosis not present

## 2014-08-10 DIAGNOSIS — N2581 Secondary hyperparathyroidism of renal origin: Secondary | ICD-10-CM | POA: Diagnosis not present

## 2014-08-10 DIAGNOSIS — E1129 Type 2 diabetes mellitus with other diabetic kidney complication: Secondary | ICD-10-CM | POA: Diagnosis not present

## 2014-08-13 ENCOUNTER — Ambulatory Visit (INDEPENDENT_AMBULATORY_CARE_PROVIDER_SITE_OTHER): Payer: Medicare Other | Admitting: Family Medicine

## 2014-08-13 ENCOUNTER — Encounter: Payer: Self-pay | Admitting: Family Medicine

## 2014-08-13 VITALS — BP 124/56 | HR 70 | Temp 97.8°F | Resp 16 | Ht 65.0 in | Wt 157.2 lb

## 2014-08-13 DIAGNOSIS — I25700 Atherosclerosis of coronary artery bypass graft(s), unspecified, with unstable angina pectoris: Secondary | ICD-10-CM | POA: Diagnosis not present

## 2014-08-13 NOTE — Patient Instructions (Signed)
Follow up as scheduled No med changes at this time You look great!  Keep it up! Call with any questions or concerns Try and enjoy the rest of your summer!!!

## 2014-08-13 NOTE — Assessment & Plan Note (Signed)
Chronic problem for pt.  After his most recent procedure he is currently pain free.  He remains anxious about his future as he knows there is not much to be done at this time but was encouraged by Dr Allison Quarry efforts to reach out to other physicians to determine the next steps.  Will follow along and assist as able.

## 2014-08-13 NOTE — Progress Notes (Signed)
   Subjective:    Patient ID: NABIL CURLESS, male    DOB: [birth year], 74 y.o.   MRN: [id number]  [facility] f/u- pt was admitted on 726-27 for PCI w/ balloon cutting angioplasty for in-stent restenosis in RCA.  'i feel good now'.  No CP, SOB has improved.  Groin site is well healed.  Plan is when you next develop sxs to call cards immediately so that they can arrange for a new procedure to be done.   Review of Systems For ROS see HPI     Objective:   Physical Exam  Constitutional: He is oriented to person, place, and time. He appears well-developed and well-nourished. No distress.  HENT:  Head: Normocephalic and atraumatic.  Eyes: Conjunctivae and EOM are normal. Pupils are equal, round, and reactive to light.  Cardiovascular: Normal rate, regular rhythm, normal heart sounds and intact distal pulses.   Pulmonary/Chest: Effort normal and breath sounds normal. No respiratory distress. He has no wheezes. He has no rales.  Musculoskeletal: He exhibits no edema.  Neurological: He is alert and oriented to person, place, and time.  Skin: Skin is warm and dry.  Psychiatric: He has a normal mood and affect. His behavior is normal. Thought content normal.  Vitals reviewed.         Assessment & Plan:

## 2014-08-13 NOTE — Progress Notes (Signed)
Pre visit review using our clinic review tool, if applicable. No additional management support is needed unless otherwise documented below in the visit note. 

## 2014-08-18 ENCOUNTER — Ambulatory Visit (INDEPENDENT_AMBULATORY_CARE_PROVIDER_SITE_OTHER): Payer: Medicare Other | Admitting: Physician Assistant

## 2014-08-18 ENCOUNTER — Telehealth: Payer: Self-pay | Admitting: Physician Assistant

## 2014-08-18 ENCOUNTER — Encounter: Payer: Self-pay | Admitting: Physician Assistant

## 2014-08-18 VITALS — BP 124/58 | HR 66 | Ht 65.0 in | Wt 156.8 lb

## 2014-08-18 DIAGNOSIS — I25810 Atherosclerosis of coronary artery bypass graft(s) without angina pectoris: Secondary | ICD-10-CM

## 2014-08-18 DIAGNOSIS — I10 Essential (primary) hypertension: Secondary | ICD-10-CM

## 2014-08-18 DIAGNOSIS — N186 End stage renal disease: Secondary | ICD-10-CM | POA: Diagnosis not present

## 2014-08-18 DIAGNOSIS — I2510 Atherosclerotic heart disease of native coronary artery without angina pectoris: Secondary | ICD-10-CM

## 2014-08-18 DIAGNOSIS — E785 Hyperlipidemia, unspecified: Secondary | ICD-10-CM | POA: Diagnosis not present

## 2014-08-18 DIAGNOSIS — I208 Other forms of angina pectoris: Secondary | ICD-10-CM | POA: Diagnosis not present

## 2014-08-18 DIAGNOSIS — I5042 Chronic combined systolic (congestive) and diastolic (congestive) heart failure: Secondary | ICD-10-CM

## 2014-08-18 DIAGNOSIS — I447 Left bundle-branch block, unspecified: Secondary | ICD-10-CM

## 2014-08-18 DIAGNOSIS — Z789 Other specified health status: Secondary | ICD-10-CM

## 2014-08-18 NOTE — Assessment & Plan Note (Signed)
Patient appears euvolemic. 

## 2014-08-18 NOTE — Assessment & Plan Note (Signed)
Blood pressure controlled. No changes 

## 2014-08-18 NOTE — Telephone Encounter (Signed)
New Message        Pt calling stating that he was in our office today and seen by Tarri Fuller and was sent to [facility] lab to get a test done and they state they no longer do that test and haven't done so in over a year. Please call back and advise.

## 2014-08-18 NOTE — Progress Notes (Signed)
Patient ID: [name] [name], male   DOB: 09/20/1940, 74 y.o.   MRN: [id number]    Date:  08/18/2014   ID:  [name] [name], DOB 1940/07/29, MRN [id number]  PCP:  Annye Asa, MD  Primary Cardiologist:  Ellyn Hack   Chief complaint: Post hospital follow-up   History of Present Illness: [name] [name] is a 74 y.o. male  74 y.o. male with long-standing CAD-CABG (with re-do CABG in 2006) who has had BMS PCI to SVG-OM and extensive PCI to native RCA from 2012-2015 - most recently with new stent placed for distal ISR lesion in the mid RCA for Unstable Angina. The RCA BMS has now been overlapped with at least 2 DES stents. The more distal DES stent in the RCA. He has had now had several admissions for ACS/unstable angina or non-STEMI with PTCA of in-stent restenosis in February and March of 2016 and was seen in clinic on 7/25 with complaints of recurrent class III to IV angina. Decision was made to pursue repeat cath.  Hospital Course  Pt presented to the [facility] cardiac catheterization laboratory on 7/26, following outpt dialysis that morning. He underwent diagnostic catheterization revealing recurrent, severe, in-stent restenosis within the RCA. Anatomy was otherwise stable anatomy and patent grafts to the OM2 and distal LAD. The RCA was successfully treated using cutting balloon angioplasty with residual 20% stenosis following the procedure. [name] [name] tolerated the procedure well and post-procedure, has been ambulating without recurrent symptoms or limitations.   Patient presents for follow-up.  Back in February we did have P2 Y 12 test showed Plavix was not functioning. He was then switched to Brilinta but was very short of breath.  He is now on twice a day Plavix. He has a history of stroke so effient is not an option.   He denies any angina at this time.  He also denies nausea, vomiting, fever, chest pain, shortness of breath, orthopnea, dizziness, PND, cough, congestion, abdominal pain,  hematochezia, melena, lower extremity edema, claudication.  Wt Readings from Last 3 Encounters:  08/18/14 156 lb 12.8 oz (71.124 kg)  08/13/14 157 lb 3.2 oz (71.305 kg)  08/03/14 156 lb 4.9 oz (70.9 kg)     Past Medical History  Diagnosis Date  . Diabetes mellitus   . Hyperlipidemia   . Hypertension   . Gout   . Hypothyroidism     On supplementation  . LBBB (left bundle branch block)     Chronic  . Anemia     Likely secondary to history of GI bleed  . Stroke 1998  . CAD in native artery; and the grafts 1992, 1997, 2002, 2006, 2012    CABG 1992 and redo CABG 2006  . CAD (coronary artery disease) of bypass graft 2006, 2012    In 2006: Occluded SVG-OM noted (SVG-diagonal was previously occluded); 2012: Severe lesion in redo SVG-OM1 --> BMS PCI   . CAD S/P percutaneous coronary angioplasty October 2012; Feb, Apr & Oct 2015; Feb 2016    a) s/p CABG 1992 and redo 2006. b) 10/12: NSTEMI: PCI to SVG-OM1 Integrity BMS 3.5 x 15 (3.85 mm) c) 2/15: UA - PCI mid RCA Xience Ap DES 2.75 x 15 (3.1 mm). d) 4/15 : UA - focal dRCA Resolute DES 2.25 x 8 (2.5 mm). e) 10/15: PCI to mRCA ISR w/ post stent lesion - Promus P 2.75 x 16 (3.25 mm). f) 2/16: NSTEMI: CBA/PTCA of  mRCA ISR (3.5 mm post-dilation); g) 7/16 ISR RCA->CBA/PTCA, residual  20%.  . Ischemic cardiomyopathy     a) EF 45-50% by echo 2015. b) EF 50% by cath 04/2014.  Marland Kitchen History of Non-ST elevated myocardial infarction (non-STEMI) 1992, 2006, 2012; 02/2013  . Peptic ulcer disease  2004  . BPH (benign prostatic hyperplasia)   . Chronic low back pain   . Carotid arterial disease     a) Duplex 05/2012: R BULB/PROX ICA: 50-69%, L BULB/PROX ICA: 0-49%. ;; 04/29/2014: [id number] RICA, [id number] LICA. Patetn vertebrals,  . Colon cancer 2003    colectomy for CA. no recurrence . never required  radiation or chem  . End stage renal disease on dialysis     Dialyzes at [facility]: Tues/Th/Sat - left upper cavity AV fistula brachiocephalic  . Plavix  resistance     a) P2Y12 264 in 02/2014 - put on Brilinta but did not tolerate due to SOB. b) cannot be on Effient due to history of stroke. c) Plavix increased to 75mg  BID and Pletal added 04/2014 due to ISR.    Current Outpatient Prescriptions  Medication Sig Dispense Refill  . aspirin 81 MG chewable tablet Chew 1 tablet (81 mg total) by mouth daily. (Patient taking differently: Chew 81 mg by mouth at bedtime. )    . atorvastatin (LIPITOR) 40 MG tablet Take 1 tablet (40 mg total) by mouth at bedtime. 90 tablet 1  . Besifloxacin HCl (BESIVANCE) 0.6 % SUSP Place 1 drop into both eyes See admin instructions. Use eye drops 4 times daily for 2 days following injection by Dr. Zigmund Daniel (every 10 weeks)    . carvedilol (COREG) 25 MG tablet Take 1 tablet (25 mg total) by mouth 2 (two) times daily with a meal. 180 tablet 1  . cilostazol (PLETAL) 100 MG tablet Take 1 tablet (100 mg total) by mouth 2 (two) times daily. 180 tablet 3  . clopidogrel (PLAVIX) 75 MG tablet Take 1 tablet (75 mg total) by mouth 2 (two) times daily. 60 tablet 6  . glucose blood (PRODIGY NO CODING BLOOD GLUC) test strip Test as directed 100 each 6  . insulin NPH Human (HUMULIN N,NOVOLIN N) 100 UNIT/ML injection Inject 10-20 Units into the skin 2 (two) times daily as needed (CBG >130). CBG 130-150 inject 10 units, >150 inject 20 units    . isosorbide mononitrate (IMDUR) 120 MG 24 hr tablet Take 1 tablet (120 mg total) by mouth daily. 90 tablet 3  . levothyroxine (SYNTHROID, LEVOTHROID) 125 MCG tablet TAKE ONE TABLET BY MOUTH ONCE DAILY BEFORE BREAKFAST 90 tablet 1  . lidocaine-prilocaine (EMLA) cream Apply 1 application topically See admin instructions. Apply prior to dialysis treatments on Tuesday, Thursday and Saturday    . loperamide (IMODIUM A-D) 2 MG tablet Take 4 mg by mouth 2 (two) times daily as needed for diarrhea or loose stools.    . multivitamin (RENA-VIT) TABS tablet Take 1 tablet by mouth daily. 90 tablet 3  .  nitroGLYCERIN (NITROSTAT) 0.4 MG SL tablet Place 1 tablet (0.4 mg total) under the tongue every 5 (five) minutes as needed for chest pain. 100 tablet 4  . sevelamer carbonate (RENVELA) 800 MG tablet Take 1,600-2,400 mg by mouth See admin instructions. Take 3 tablets (2400 mg) with meals and 2 tablets (1600 mg) with snacks     No current facility-administered medications for this visit.    Allergies:    Allergies  Allergen Reactions  . Brilinta [Ticagrelor] Shortness Of Breath  . Shellfish Allergy Other (See Comments)    Causes  gout flare-ups    Social History:  The patient  reports that he has never smoked. He has never used smokeless tobacco. He reports that he does not drink alcohol or use illicit drugs.   Family history:   Family History  Problem Relation Age of Onset  . Heart disease Mother   . Hypertension Mother   . Diabetes Mother   . Heart disease Father   . Hypertension Father   . Diabetes Father   . Heart attack Mother   . Heart attack Father   . Stroke Paternal Aunt   . Stroke Paternal Uncle     ROS:  Please see the history of present illness.  All other systems reviewed and negative.   PHYSICAL EXAM: VS:  BP 124/58 mmHg  Pulse 66  Ht 5\' 5"  (1.651 m)  Wt 156 lb 12.8 oz (71.124 kg)  BMI 26.09 kg/m2  SpO2 99% Well nourished, well developed, in no acute distress HEENT: Pupils are equal round react to light accommodation extraocular movements are intact.  Neck: no JVDNo cervical lymphadenopathy. Cardiac: Regular rate and rhythm without murmurs rubs or gallops. Lungs:  clear to auscultation bilaterally, no wheezing, rhonchi or rales Abd: soft, nontender, positive bowel sounds all quadrants, no hepatosplenomegaly Ext: no lower extremity edema.  2+ right radial 0 left pulses. Skin: warm and dry Neuro:  Grossly normal     ASSESSMENT AND PLAN:  Problem List Items Addressed This Visit    Plavix resistance   LBBB (left bundle branch block)   Hyperlipidemia  with target LDL less than 70 (Chronic)    Continue statin. Lipid Panel     Component Value Date/Time   CHOL 71 07/21/2014 0955   TRIG 104.0 07/21/2014 0955   HDL 28.70* 07/21/2014 0955   CHOLHDL 2 07/21/2014 0955   VLDL 20.8 07/21/2014 0955   LDLCALC 22 07/21/2014 0955   LDLDIRECT 33.5 12/22/2012 0946          Essential hypertension (Chronic)    Blood pressure controlled. No changes      End stage renal disease - on hemodialysis (Chronic)   Chronic combined systolic and diastolic congestive heart failure, NYHA class 2 (Chronic)    Patient appears euvolemic.      CAD (coronary artery disease) of bypass graft - PCI to SVG-OM with BMS; PCI-mid RCA with a Xience DES - Primary (Chronic)    Status post cutting balloon angioplasty to the RCA with 20% residual stenosis. He is on twice a day Plavix. P2 Y 12 in February was 264. He was tried on Brilinta and did not tolerate it at all. He is also on Pletal and aspirin.  Follow-up as scheduled in October      Relevant Orders   Platelet inhibition p2y12

## 2014-08-18 NOTE — Assessment & Plan Note (Signed)
Status post cutting balloon angioplasty to the RCA with 20% residual stenosis. He is on twice a day Plavix. P2 Y 12 in February was 264. He was tried on Brilinta and did not tolerate it at all. He is also on Pletal and aspirin.  Follow-up as scheduled in October

## 2014-08-18 NOTE — Telephone Encounter (Signed)
Returned pt call. Apologized for the inconvenience. Adv pt we did not know that [facility] was no longer drawing the P2Y12 lab Bryan Hager,PA id requesting. Adv pt that he would need to go to [facility] to have the lab drawn. Pt sts that I will not be until next week, he has dialysis tomorrow. Adv him that would be ok. Lab order in epic. Pt verbalized understanding.

## 2014-08-18 NOTE — Patient Instructions (Addendum)
Medication Instructions:  Your physician recommends that you continue on your current medications as directed. Please refer to the Current Medication list given to you today.   Labwork: Today: P2Y12 Please go to [facility] lab. [address]  Testing/Procedures: None    Follow-Up: Follow up as planned in October with Dr.Harding  Any Other Special Instructions Will Be Listed Below (If Applicable).

## 2014-08-18 NOTE — Assessment & Plan Note (Signed)
Continue statin. Lipid Panel     Component Value Date/Time   CHOL 71 07/21/2014 0955   TRIG 104.0 07/21/2014 0955   HDL 28.70* 07/21/2014 0955   CHOLHDL 2 07/21/2014 0955   VLDL 20.8 07/21/2014 0955   LDLCALC 22 07/21/2014 0955   LDLDIRECT 33.5 12/22/2012 0946

## 2014-08-27 ENCOUNTER — Other Ambulatory Visit (HOSPITAL_COMMUNITY): Admission: RE | Admit: 2014-08-27 | Payer: Medicare Other | Source: Ambulatory Visit | Admitting: Urology

## 2014-08-27 ENCOUNTER — Other Ambulatory Visit (HOSPITAL_COMMUNITY)
Admission: RE | Admit: 2014-08-27 | Discharge: 2014-08-27 | Disposition: A | Discharge status: Home / Self Care | Payer: Medicare Other | Source: Ambulatory Visit | Attending: Cardiology | Admitting: Cardiology

## 2014-08-27 DIAGNOSIS — I2510 Atherosclerotic heart disease of native coronary artery without angina pectoris: Secondary | ICD-10-CM | POA: Insufficient documentation

## 2014-08-27 DIAGNOSIS — Z79899 Other long term (current) drug therapy: Secondary | ICD-10-CM | POA: Diagnosis not present

## 2014-08-27 LAB — PLATELET INHIBITION P2Y12: Platelet Function  P2Y12: 64 [PRU] — ABNORMAL LOW (ref 194–418)

## 2014-09-01 ENCOUNTER — Other Ambulatory Visit: Payer: Self-pay

## 2014-09-01 NOTE — Patient Outreach (Signed)
[facility]) Care Management  09/01/2014  GULED VON Mar 06, 1940 [id number]   Notification received from [place] to close case due to patient refusing services.  Paiton Boultinghouse L. Kebin Maye, [place] Care Management Assistant

## 2014-09-01 NOTE — Patient Outreach (Signed)
[place] [facility]) Care Management  09/01/2014  [name] [name] [birth year] [id number]  Telephone call to patient regarding primary MD referral.  Discussed and offered [facility] care management services to patient. Patient declined services at this time. Patient states he is doing very good.  Patient agreed to receive [facility] care management outreach letter and brochure for future use.   PLAN: RNCM will send patient outreach letter and brochure. RNCM will refer patient to [facility] to close due to refusal of services. RNCM will notify patients primary MD of refusal of services.   [name] RN,BSN,CCM [place] Coordinator [phone]

## 2014-09-07 ENCOUNTER — Telehealth: Payer: Self-pay | Admitting: Family Medicine

## 2014-09-07 MED ORDER — CLONAZEPAM 0.5 MG PO TABS: 0.2500 mg | ORAL_TABLET | Freq: Every day | ORAL | Status: DC | PRN

## 2014-09-07 NOTE — Telephone Encounter (Signed)
Caller Name: Pratik, [place] Relation to pt: spouse  Call back number: [phone]   Reason for call:  Spouse states before dialysis patient is having panic attacks, requesting a script to help patient with his anxiety. Offered an appointment and spouse stated would like to hear from  PCP first.

## 2014-09-07 NOTE — Telephone Encounter (Signed)
We could certainly do a medication prior to dialysis to help calm him (klonazepam 0.5mg - start w/ 1/2 tab as needed, #30, 1 refill) but this is new for him.  Do we need an appt to discuss what is going on?

## 2014-09-07 NOTE — Telephone Encounter (Signed)
Spoke with pt wife. She advised that he is having anxiety, advised if medication does not help then he needs to come in for an appt.

## 2014-09-08 DIAGNOSIS — E1129 Type 2 diabetes mellitus with other diabetic kidney complication: Secondary | ICD-10-CM | POA: Diagnosis not present

## 2014-09-08 DIAGNOSIS — N186 End stage renal disease: Secondary | ICD-10-CM | POA: Diagnosis not present

## 2014-09-08 DIAGNOSIS — Z992 Dependence on renal dialysis: Secondary | ICD-10-CM | POA: Diagnosis not present

## 2014-09-09 DIAGNOSIS — E1129 Type 2 diabetes mellitus with other diabetic kidney complication: Secondary | ICD-10-CM | POA: Diagnosis not present

## 2014-09-09 DIAGNOSIS — N2581 Secondary hyperparathyroidism of renal origin: Secondary | ICD-10-CM | POA: Diagnosis not present

## 2014-09-09 DIAGNOSIS — Z23 Encounter for immunization: Secondary | ICD-10-CM | POA: Diagnosis not present

## 2014-09-09 DIAGNOSIS — D689 Coagulation defect, unspecified: Secondary | ICD-10-CM | POA: Diagnosis not present

## 2014-09-09 DIAGNOSIS — D631 Anemia in chronic kidney disease: Secondary | ICD-10-CM | POA: Diagnosis not present

## 2014-09-09 DIAGNOSIS — N186 End stage renal disease: Secondary | ICD-10-CM | POA: Diagnosis not present

## 2014-09-19 ENCOUNTER — Other Ambulatory Visit: Payer: Self-pay | Admitting: Family Medicine

## 2014-09-20 NOTE — Telephone Encounter (Signed)
Medication filled to pharmacy as requested.   

## 2014-09-25 DIAGNOSIS — N186 End stage renal disease: Secondary | ICD-10-CM | POA: Diagnosis not present

## 2014-10-08 DIAGNOSIS — N186 End stage renal disease: Secondary | ICD-10-CM | POA: Diagnosis not present

## 2014-10-08 DIAGNOSIS — E1129 Type 2 diabetes mellitus with other diabetic kidney complication: Secondary | ICD-10-CM | POA: Diagnosis not present

## 2014-10-08 DIAGNOSIS — Z992 Dependence on renal dialysis: Secondary | ICD-10-CM | POA: Diagnosis not present

## 2014-10-09 DIAGNOSIS — N186 End stage renal disease: Secondary | ICD-10-CM | POA: Diagnosis not present

## 2014-10-09 DIAGNOSIS — E1129 Type 2 diabetes mellitus with other diabetic kidney complication: Secondary | ICD-10-CM | POA: Diagnosis not present

## 2014-10-09 DIAGNOSIS — N2581 Secondary hyperparathyroidism of renal origin: Secondary | ICD-10-CM | POA: Diagnosis not present

## 2014-10-09 DIAGNOSIS — D689 Coagulation defect, unspecified: Secondary | ICD-10-CM | POA: Diagnosis not present

## 2014-10-09 DIAGNOSIS — D631 Anemia in chronic kidney disease: Secondary | ICD-10-CM | POA: Diagnosis not present

## 2014-10-12 DIAGNOSIS — D631 Anemia in chronic kidney disease: Secondary | ICD-10-CM | POA: Diagnosis not present

## 2014-10-12 DIAGNOSIS — N186 End stage renal disease: Secondary | ICD-10-CM | POA: Diagnosis not present

## 2014-10-12 DIAGNOSIS — D689 Coagulation defect, unspecified: Secondary | ICD-10-CM | POA: Diagnosis not present

## 2014-10-12 DIAGNOSIS — E1129 Type 2 diabetes mellitus with other diabetic kidney complication: Secondary | ICD-10-CM | POA: Diagnosis not present

## 2014-10-12 DIAGNOSIS — N2581 Secondary hyperparathyroidism of renal origin: Secondary | ICD-10-CM | POA: Diagnosis not present

## 2014-10-14 DIAGNOSIS — E1129 Type 2 diabetes mellitus with other diabetic kidney complication: Secondary | ICD-10-CM | POA: Diagnosis not present

## 2014-10-14 DIAGNOSIS — N186 End stage renal disease: Secondary | ICD-10-CM | POA: Diagnosis not present

## 2014-10-14 DIAGNOSIS — D631 Anemia in chronic kidney disease: Secondary | ICD-10-CM | POA: Diagnosis not present

## 2014-10-14 DIAGNOSIS — N2581 Secondary hyperparathyroidism of renal origin: Secondary | ICD-10-CM | POA: Diagnosis not present

## 2014-10-14 DIAGNOSIS — D689 Coagulation defect, unspecified: Secondary | ICD-10-CM | POA: Diagnosis not present

## 2014-10-15 ENCOUNTER — Encounter (INDEPENDENT_AMBULATORY_CARE_PROVIDER_SITE_OTHER): Payer: Medicare Other | Admitting: Ophthalmology

## 2014-10-15 DIAGNOSIS — E113312 Type 2 diabetes mellitus with moderate nonproliferative diabetic retinopathy with macular edema, left eye: Secondary | ICD-10-CM

## 2014-10-15 DIAGNOSIS — E113391 Type 2 diabetes mellitus with moderate nonproliferative diabetic retinopathy without macular edema, right eye: Secondary | ICD-10-CM

## 2014-10-15 DIAGNOSIS — H35033 Hypertensive retinopathy, bilateral: Secondary | ICD-10-CM

## 2014-10-15 DIAGNOSIS — E11311 Type 2 diabetes mellitus with unspecified diabetic retinopathy with macular edema: Secondary | ICD-10-CM | POA: Diagnosis not present

## 2014-10-15 DIAGNOSIS — H43813 Vitreous degeneration, bilateral: Secondary | ICD-10-CM

## 2014-10-15 DIAGNOSIS — I10 Essential (primary) hypertension: Secondary | ICD-10-CM | POA: Diagnosis not present

## 2014-10-16 DIAGNOSIS — N186 End stage renal disease: Secondary | ICD-10-CM | POA: Diagnosis not present

## 2014-10-16 DIAGNOSIS — D689 Coagulation defect, unspecified: Secondary | ICD-10-CM | POA: Diagnosis not present

## 2014-10-16 DIAGNOSIS — N2581 Secondary hyperparathyroidism of renal origin: Secondary | ICD-10-CM | POA: Diagnosis not present

## 2014-10-16 DIAGNOSIS — D631 Anemia in chronic kidney disease: Secondary | ICD-10-CM | POA: Diagnosis not present

## 2014-10-16 DIAGNOSIS — E1129 Type 2 diabetes mellitus with other diabetic kidney complication: Secondary | ICD-10-CM | POA: Diagnosis not present

## 2014-10-19 DIAGNOSIS — D631 Anemia in chronic kidney disease: Secondary | ICD-10-CM | POA: Diagnosis not present

## 2014-10-19 DIAGNOSIS — E1129 Type 2 diabetes mellitus with other diabetic kidney complication: Secondary | ICD-10-CM | POA: Diagnosis not present

## 2014-10-19 DIAGNOSIS — D689 Coagulation defect, unspecified: Secondary | ICD-10-CM | POA: Diagnosis not present

## 2014-10-19 DIAGNOSIS — N2581 Secondary hyperparathyroidism of renal origin: Secondary | ICD-10-CM | POA: Diagnosis not present

## 2014-10-19 DIAGNOSIS — N186 End stage renal disease: Secondary | ICD-10-CM | POA: Diagnosis not present

## 2014-10-21 DIAGNOSIS — D631 Anemia in chronic kidney disease: Secondary | ICD-10-CM | POA: Diagnosis not present

## 2014-10-21 DIAGNOSIS — N186 End stage renal disease: Secondary | ICD-10-CM | POA: Diagnosis not present

## 2014-10-21 DIAGNOSIS — E1129 Type 2 diabetes mellitus with other diabetic kidney complication: Secondary | ICD-10-CM | POA: Diagnosis not present

## 2014-10-21 DIAGNOSIS — N2581 Secondary hyperparathyroidism of renal origin: Secondary | ICD-10-CM | POA: Diagnosis not present

## 2014-10-21 DIAGNOSIS — D689 Coagulation defect, unspecified: Secondary | ICD-10-CM | POA: Diagnosis not present

## 2014-10-23 DIAGNOSIS — N2581 Secondary hyperparathyroidism of renal origin: Secondary | ICD-10-CM | POA: Diagnosis not present

## 2014-10-23 DIAGNOSIS — D631 Anemia in chronic kidney disease: Secondary | ICD-10-CM | POA: Diagnosis not present

## 2014-10-23 DIAGNOSIS — E1129 Type 2 diabetes mellitus with other diabetic kidney complication: Secondary | ICD-10-CM | POA: Diagnosis not present

## 2014-10-23 DIAGNOSIS — N186 End stage renal disease: Secondary | ICD-10-CM | POA: Diagnosis not present

## 2014-10-23 DIAGNOSIS — D689 Coagulation defect, unspecified: Secondary | ICD-10-CM | POA: Diagnosis not present

## 2014-10-25 ENCOUNTER — Ambulatory Visit (INDEPENDENT_AMBULATORY_CARE_PROVIDER_SITE_OTHER): Payer: Medicare Other | Admitting: Cardiology

## 2014-10-25 ENCOUNTER — Encounter: Payer: Self-pay | Admitting: Cardiology

## 2014-10-25 VITALS — BP 124/56 | HR 65 | Ht 65.0 in | Wt 155.0 lb

## 2014-10-25 DIAGNOSIS — I5042 Chronic combined systolic (congestive) and diastolic (congestive) heart failure: Secondary | ICD-10-CM

## 2014-10-25 DIAGNOSIS — I10 Essential (primary) hypertension: Secondary | ICD-10-CM

## 2014-10-25 DIAGNOSIS — I25708 Atherosclerosis of coronary artery bypass graft(s), unspecified, with other forms of angina pectoris: Secondary | ICD-10-CM | POA: Diagnosis not present

## 2014-10-25 DIAGNOSIS — Z794 Long term (current) use of insulin: Secondary | ICD-10-CM

## 2014-10-25 DIAGNOSIS — E0840 Diabetes mellitus due to underlying condition with diabetic neuropathy, unspecified: Secondary | ICD-10-CM

## 2014-10-25 DIAGNOSIS — Z9861 Coronary angioplasty status: Secondary | ICD-10-CM

## 2014-10-25 DIAGNOSIS — I779 Disorder of arteries and arterioles, unspecified: Secondary | ICD-10-CM

## 2014-10-25 DIAGNOSIS — Z955 Presence of coronary angioplasty implant and graft: Secondary | ICD-10-CM

## 2014-10-25 DIAGNOSIS — I209 Angina pectoris, unspecified: Secondary | ICD-10-CM

## 2014-10-25 DIAGNOSIS — I2510 Atherosclerotic heart disease of native coronary artery without angina pectoris: Secondary | ICD-10-CM

## 2014-10-25 DIAGNOSIS — I739 Peripheral vascular disease, unspecified: Secondary | ICD-10-CM

## 2014-10-25 DIAGNOSIS — E785 Hyperlipidemia, unspecified: Secondary | ICD-10-CM | POA: Diagnosis not present

## 2014-10-25 DIAGNOSIS — N186 End stage renal disease: Secondary | ICD-10-CM

## 2014-10-25 DIAGNOSIS — Z789 Other specified health status: Secondary | ICD-10-CM

## 2014-10-25 NOTE — Patient Instructions (Signed)
IF YOU FEEL TIRED AFTER YOUR DIALYSIS DAY- YOU CAN TAKE 1/2 OF YOUR CARVEDILOL THAT AFTERNOON.  NO OTHER CHANGES  Your physician wants you to follow-up in 3 MONTHS WITH DR HARDING.
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
## 2014-10-26 DIAGNOSIS — D631 Anemia in chronic kidney disease: Secondary | ICD-10-CM | POA: Diagnosis not present

## 2014-10-26 DIAGNOSIS — D689 Coagulation defect, unspecified: Secondary | ICD-10-CM | POA: Diagnosis not present

## 2014-10-26 DIAGNOSIS — E1129 Type 2 diabetes mellitus with other diabetic kidney complication: Secondary | ICD-10-CM | POA: Diagnosis not present

## 2014-10-26 DIAGNOSIS — N2581 Secondary hyperparathyroidism of renal origin: Secondary | ICD-10-CM | POA: Diagnosis not present

## 2014-10-26 DIAGNOSIS — N186 End stage renal disease: Secondary | ICD-10-CM | POA: Diagnosis not present

## 2014-10-27 ENCOUNTER — Encounter: Payer: Self-pay | Admitting: Cardiology

## 2014-10-27 NOTE — Assessment & Plan Note (Signed)
Stable. Continues to be on stable dose of statin

## 2014-10-27 NOTE — Assessment & Plan Note (Signed)
Seen by Dr. Alvester Chou. Carotid Dopplers revealed moderate right and mild-to-moderate left ICA stenoses. Plan is to monitor on an annual basis.

## 2014-10-27 NOTE — Assessment & Plan Note (Signed)
Currently acting more like class I angina. But for the most part has been class II. As long as you're to maintain a stable level, we would avoid further instrumentation.

## 2014-10-27 NOTE — Assessment & Plan Note (Signed)
Volume control by dialysis. He seems to be relatively stable without significant orthopnea symptoms. On stable dose of beta blocker.

## 2014-10-27 NOTE — Assessment & Plan Note (Signed)
Plavix increase to 75 mg twice a day and Pletal added. No Effient secondary to history of stroke. Had profound dyspnea with Alimta.

## 2014-10-27 NOTE — Assessment & Plan Note (Signed)
[name] [name] be maintaining stable patency of his native RCA. This seems to be the recurrent problem and what is probably a triple overlap stented segment. There probably was an adequate stent apposition or expansion in that area with the initial stent secondary to calcification. This has been the target of PCI on many occasions. Unfortunately he is due for distal and too difficult to reach (requiring GuideLiner catheter) to consider using rotational atherectomy. Last PTCA was done with a larger balloon. Hopefully this has provided great luminal area.  On carvedilol,Imdur, statin, twice a day Plavix and has a history of nonresponsiveness - but intolerance to the other antiplatelet agents - partially related to cost.  He is also on Pletal

## 2014-10-28 DIAGNOSIS — N186 End stage renal disease: Secondary | ICD-10-CM | POA: Diagnosis not present

## 2014-10-28 DIAGNOSIS — D631 Anemia in chronic kidney disease: Secondary | ICD-10-CM | POA: Diagnosis not present

## 2014-10-28 DIAGNOSIS — E1129 Type 2 diabetes mellitus with other diabetic kidney complication: Secondary | ICD-10-CM | POA: Diagnosis not present

## 2014-10-28 DIAGNOSIS — D689 Coagulation defect, unspecified: Secondary | ICD-10-CM | POA: Diagnosis not present

## 2014-10-28 DIAGNOSIS — N2581 Secondary hyperparathyroidism of renal origin: Secondary | ICD-10-CM | POA: Diagnosis not present

## 2014-10-30 DIAGNOSIS — N186 End stage renal disease: Secondary | ICD-10-CM | POA: Diagnosis not present

## 2014-10-30 DIAGNOSIS — N2581 Secondary hyperparathyroidism of renal origin: Secondary | ICD-10-CM | POA: Diagnosis not present

## 2014-11-02 DIAGNOSIS — E1129 Type 2 diabetes mellitus with other diabetic kidney complication: Secondary | ICD-10-CM | POA: Diagnosis not present

## 2014-11-02 DIAGNOSIS — D689 Coagulation defect, unspecified: Secondary | ICD-10-CM | POA: Diagnosis not present

## 2014-11-02 DIAGNOSIS — N186 End stage renal disease: Secondary | ICD-10-CM | POA: Diagnosis not present

## 2014-11-02 DIAGNOSIS — N2581 Secondary hyperparathyroidism of renal origin: Secondary | ICD-10-CM | POA: Diagnosis not present

## 2014-11-02 DIAGNOSIS — D631 Anemia in chronic kidney disease: Secondary | ICD-10-CM | POA: Diagnosis not present

## 2014-11-03 ENCOUNTER — Ambulatory Visit (HOSPITAL_COMMUNITY)
Admission: RE | Admit: 2014-11-03 | Discharge: 2014-11-03 | Disposition: A | Discharge status: Home / Self Care | Payer: Medicare Other | Source: Ambulatory Visit | Attending: Cardiology | Admitting: Cardiology

## 2014-11-03 DIAGNOSIS — I120 Hypertensive chronic kidney disease with stage 5 chronic kidney disease or end stage renal disease: Secondary | ICD-10-CM | POA: Diagnosis not present

## 2014-11-03 DIAGNOSIS — I6529 Occlusion and stenosis of unspecified carotid artery: Secondary | ICD-10-CM | POA: Diagnosis not present

## 2014-11-03 DIAGNOSIS — I6523 Occlusion and stenosis of bilateral carotid arteries: Secondary | ICD-10-CM | POA: Insufficient documentation

## 2014-11-03 DIAGNOSIS — N186 End stage renal disease: Secondary | ICD-10-CM | POA: Diagnosis not present

## 2014-11-03 DIAGNOSIS — E119 Type 2 diabetes mellitus without complications: Secondary | ICD-10-CM | POA: Insufficient documentation

## 2014-11-03 DIAGNOSIS — Z992 Dependence on renal dialysis: Secondary | ICD-10-CM | POA: Insufficient documentation

## 2014-11-03 DIAGNOSIS — E785 Hyperlipidemia, unspecified: Secondary | ICD-10-CM | POA: Insufficient documentation

## 2014-11-04 DIAGNOSIS — N186 End stage renal disease: Secondary | ICD-10-CM | POA: Diagnosis not present

## 2014-11-04 DIAGNOSIS — D689 Coagulation defect, unspecified: Secondary | ICD-10-CM | POA: Diagnosis not present

## 2014-11-04 DIAGNOSIS — D631 Anemia in chronic kidney disease: Secondary | ICD-10-CM | POA: Diagnosis not present

## 2014-11-04 DIAGNOSIS — N2581 Secondary hyperparathyroidism of renal origin: Secondary | ICD-10-CM | POA: Diagnosis not present

## 2014-11-04 DIAGNOSIS — E1129 Type 2 diabetes mellitus with other diabetic kidney complication: Secondary | ICD-10-CM | POA: Diagnosis not present

## 2014-11-05 ENCOUNTER — Telehealth: Payer: Self-pay | Admitting: *Deleted

## 2014-11-05 ENCOUNTER — Ambulatory Visit (INDEPENDENT_AMBULATORY_CARE_PROVIDER_SITE_OTHER): Payer: Medicare Other | Admitting: Family Medicine

## 2014-11-05 ENCOUNTER — Encounter: Payer: Self-pay | Admitting: Family Medicine

## 2014-11-05 VITALS — BP 130/66 | HR 75 | Temp 97.9°F | Resp 16 | Ht 65.0 in | Wt 152.1 lb

## 2014-11-05 DIAGNOSIS — I209 Angina pectoris, unspecified: Secondary | ICD-10-CM

## 2014-11-05 DIAGNOSIS — E0840 Diabetes mellitus due to underlying condition with diabetic neuropathy, unspecified: Secondary | ICD-10-CM

## 2014-11-05 DIAGNOSIS — Z794 Long term (current) use of insulin: Secondary | ICD-10-CM

## 2014-11-05 DIAGNOSIS — M1A30X0 Chronic gout due to renal impairment, unspecified site, without tophus (tophi): Secondary | ICD-10-CM

## 2014-11-05 LAB — HEMOGLOBIN A1C: Hgb A1c MFr Bld: 5.7 % (ref 4.6–6.5)

## 2014-11-05 LAB — URIC ACID: Uric Acid, Serum: 3.9 mg/dL — ABNORMAL LOW (ref 4.0–7.8)

## 2014-11-05 LAB — BASIC METABOLIC PANEL
BUN: 33 mg/dL — ABNORMAL HIGH (ref 6–23)
CO2: 34 mEq/L — ABNORMAL HIGH (ref 19–32)
CREATININE: 5.24 mg/dL — AB (ref 0.40–1.50)
Calcium: 10.6 mg/dL — ABNORMAL HIGH (ref 8.4–10.5)
Chloride: 94 mEq/L — ABNORMAL LOW (ref 96–112)
GFR: 11.5 mL/min — CL (ref >60.00)
GLUCOSE: 149 mg/dL — AB (ref 70–99)
Potassium: 4.8 mEq/L (ref 3.5–5.1)
SODIUM: 137 meq/L (ref 135–145)

## 2014-11-05 MED ORDER — GLUCOSE BLOOD VI STRP: 1.0000 | ORAL_STRIP | Status: AC | PRN

## 2014-11-05 NOTE — Patient Instructions (Signed)
Follow up in 3-4 months to recheck diabetes, BP, and cholesterol We'll notify you of your lab results and make any changes if needed Keep up the good work!  You look great!! If you change your mind about ortho for the shoulder- let me know! Call with any questions or concerns If you want to join [place] at the new [place] office, any scheduled appointments will automatically transfer and we will see you at [address] [place] [address] [place], [place] [address]  Happy Halloween!!!

## 2014-11-05 NOTE — Assessment & Plan Note (Signed)
Chronic problem.  Pt reports he is doing well on his sliding scale insuline w/ CBGs centered in the 120s.  Denies symptomatic lows.  UTD on eye exam, foot exam.  No need for microalbumin due to HD.  Check labs.  Adjust meds prn

## 2014-11-05 NOTE — Telephone Encounter (Signed)
Noted.  Pt on dialysis

## 2014-11-05 NOTE — Assessment & Plan Note (Signed)
Chronic problem.  Pt's current sxs and PE are not consistent w/ gout- more consistent w/ his neuropathy.  Will check uric acid just to be sure as pt stopped his allopurinol.  Reviewed supportive care and red flags that should prompt return.  Pt expressed understanding and is in agreement w/ plan.

## 2014-11-05 NOTE — Telephone Encounter (Signed)
[facility] lab called to report critical -- creatinine 5.24  BUN--33 GFR--11.50

## 2014-11-05 NOTE — Addendum Note (Signed)
Addended by: Davis Gourd on: 11/05/2014 02:38 PM   Modules accepted: Orders, Medications

## 2014-11-05 NOTE — Progress Notes (Signed)
Pre visit review using our clinic review tool, if applicable. No additional management support is needed unless otherwise documented below in the visit note. 

## 2014-11-05 NOTE — Progress Notes (Signed)
   Subjective:    Patient ID: [name] [name], male    DOB: 1940/04/26, 74 y.o.   MRN: [id number]  HPI DM- chronic problem, UTD on foot and eye exam.  On HD, no microalbumin needed.  On sliding scale NPH insulin.  Pt reports CBGs are stable ~120-130.  No symptomatic lows.  Continues to have intermittent CP (Dr Ellyn Hack aware).  Denies SOB unless after HD.  No HAs, visual changes, edema.  Gout- pt having pain L big toe.  Mild redness, no edema.  Pt reports pain is intermittent, 'i can go 3-4 days w/ nothing and then it starts tingling'.  UTD on flu  Review of Systems For ROS see HPI     Objective:   Physical Exam  Constitutional: He is oriented to person, place, and time. He appears well-developed and well-nourished. No distress.  HENT:  Head: Normocephalic and atraumatic.  Eyes: Conjunctivae and EOM are normal. Pupils are equal, round, and reactive to light.  Neck: Normal range of motion. Neck supple. No thyromegaly present.  Cardiovascular: Normal rate, regular rhythm, normal heart sounds and intact distal pulses.   No murmur heard. Pulmonary/Chest: Effort normal and breath sounds normal. No respiratory distress.  Abdominal: Soft. Bowel sounds are normal. He exhibits no distension.  Musculoskeletal: He exhibits no edema or tenderness (no redness/tenderness of L great toe).  Lymphadenopathy:    He has no cervical adenopathy.  Neurological: He is alert and oriented to person, place, and time. No cranial nerve deficit.  Skin: Skin is warm and dry.  Psychiatric: He has a normal mood and affect. His behavior is normal.  Vitals reviewed.         Assessment & Plan:

## 2014-11-06 DIAGNOSIS — N2581 Secondary hyperparathyroidism of renal origin: Secondary | ICD-10-CM | POA: Diagnosis not present

## 2014-11-06 DIAGNOSIS — D689 Coagulation defect, unspecified: Secondary | ICD-10-CM | POA: Diagnosis not present

## 2014-11-06 DIAGNOSIS — E1129 Type 2 diabetes mellitus with other diabetic kidney complication: Secondary | ICD-10-CM | POA: Diagnosis not present

## 2014-11-06 DIAGNOSIS — D631 Anemia in chronic kidney disease: Secondary | ICD-10-CM | POA: Diagnosis not present

## 2014-11-06 DIAGNOSIS — N186 End stage renal disease: Secondary | ICD-10-CM | POA: Diagnosis not present

## 2014-11-08 DIAGNOSIS — Z992 Dependence on renal dialysis: Secondary | ICD-10-CM | POA: Diagnosis not present

## 2014-11-08 DIAGNOSIS — E1129 Type 2 diabetes mellitus with other diabetic kidney complication: Secondary | ICD-10-CM | POA: Diagnosis not present

## 2014-11-08 DIAGNOSIS — N186 End stage renal disease: Secondary | ICD-10-CM | POA: Diagnosis not present

## 2014-11-09 DIAGNOSIS — N2581 Secondary hyperparathyroidism of renal origin: Secondary | ICD-10-CM | POA: Diagnosis not present

## 2014-11-09 DIAGNOSIS — D631 Anemia in chronic kidney disease: Secondary | ICD-10-CM | POA: Diagnosis not present

## 2014-11-09 DIAGNOSIS — N186 End stage renal disease: Secondary | ICD-10-CM | POA: Diagnosis not present

## 2014-11-09 DIAGNOSIS — E1129 Type 2 diabetes mellitus with other diabetic kidney complication: Secondary | ICD-10-CM | POA: Diagnosis not present

## 2014-11-11 DIAGNOSIS — D631 Anemia in chronic kidney disease: Secondary | ICD-10-CM | POA: Diagnosis not present

## 2014-11-11 DIAGNOSIS — N186 End stage renal disease: Secondary | ICD-10-CM | POA: Diagnosis not present

## 2014-11-11 DIAGNOSIS — E1129 Type 2 diabetes mellitus with other diabetic kidney complication: Secondary | ICD-10-CM | POA: Diagnosis not present

## 2014-11-11 DIAGNOSIS — N2581 Secondary hyperparathyroidism of renal origin: Secondary | ICD-10-CM | POA: Diagnosis not present

## 2014-11-13 DIAGNOSIS — N2581 Secondary hyperparathyroidism of renal origin: Secondary | ICD-10-CM | POA: Diagnosis not present

## 2014-11-13 DIAGNOSIS — E1129 Type 2 diabetes mellitus with other diabetic kidney complication: Secondary | ICD-10-CM | POA: Diagnosis not present

## 2014-11-13 DIAGNOSIS — N186 End stage renal disease: Secondary | ICD-10-CM | POA: Diagnosis not present

## 2014-11-13 DIAGNOSIS — D631 Anemia in chronic kidney disease: Secondary | ICD-10-CM | POA: Diagnosis not present

## 2014-11-16 ENCOUNTER — Other Ambulatory Visit: Payer: Self-pay | Admitting: Family Medicine

## 2014-11-16 DIAGNOSIS — N186 End stage renal disease: Secondary | ICD-10-CM | POA: Diagnosis not present

## 2014-11-16 DIAGNOSIS — E1129 Type 2 diabetes mellitus with other diabetic kidney complication: Secondary | ICD-10-CM | POA: Diagnosis not present

## 2014-11-16 DIAGNOSIS — N2581 Secondary hyperparathyroidism of renal origin: Secondary | ICD-10-CM | POA: Diagnosis not present

## 2014-11-16 DIAGNOSIS — D631 Anemia in chronic kidney disease: Secondary | ICD-10-CM | POA: Diagnosis not present

## 2014-11-16 NOTE — Telephone Encounter (Signed)
Medication filled to pharmacy as requested.   

## 2014-11-18 DIAGNOSIS — N2581 Secondary hyperparathyroidism of renal origin: Secondary | ICD-10-CM | POA: Diagnosis not present

## 2014-11-18 DIAGNOSIS — D631 Anemia in chronic kidney disease: Secondary | ICD-10-CM | POA: Diagnosis not present

## 2014-11-18 DIAGNOSIS — N186 End stage renal disease: Secondary | ICD-10-CM | POA: Diagnosis not present

## 2014-11-18 DIAGNOSIS — E1129 Type 2 diabetes mellitus with other diabetic kidney complication: Secondary | ICD-10-CM | POA: Diagnosis not present

## 2014-11-20 DIAGNOSIS — N186 End stage renal disease: Secondary | ICD-10-CM | POA: Diagnosis not present

## 2014-11-20 DIAGNOSIS — N2581 Secondary hyperparathyroidism of renal origin: Secondary | ICD-10-CM | POA: Diagnosis not present

## 2014-11-23 DIAGNOSIS — D631 Anemia in chronic kidney disease: Secondary | ICD-10-CM | POA: Diagnosis not present

## 2014-11-23 DIAGNOSIS — N186 End stage renal disease: Secondary | ICD-10-CM | POA: Diagnosis not present

## 2014-11-23 DIAGNOSIS — E1129 Type 2 diabetes mellitus with other diabetic kidney complication: Secondary | ICD-10-CM | POA: Diagnosis not present

## 2014-11-23 DIAGNOSIS — N2581 Secondary hyperparathyroidism of renal origin: Secondary | ICD-10-CM | POA: Diagnosis not present

## 2014-11-25 DIAGNOSIS — D631 Anemia in chronic kidney disease: Secondary | ICD-10-CM | POA: Diagnosis not present

## 2014-11-25 DIAGNOSIS — N186 End stage renal disease: Secondary | ICD-10-CM | POA: Diagnosis not present

## 2014-11-25 DIAGNOSIS — N2581 Secondary hyperparathyroidism of renal origin: Secondary | ICD-10-CM | POA: Diagnosis not present

## 2014-11-25 DIAGNOSIS — E1129 Type 2 diabetes mellitus with other diabetic kidney complication: Secondary | ICD-10-CM | POA: Diagnosis not present

## 2014-11-27 DIAGNOSIS — N186 End stage renal disease: Secondary | ICD-10-CM | POA: Diagnosis not present

## 2014-11-27 DIAGNOSIS — N2581 Secondary hyperparathyroidism of renal origin: Secondary | ICD-10-CM | POA: Diagnosis not present

## 2014-11-27 DIAGNOSIS — D631 Anemia in chronic kidney disease: Secondary | ICD-10-CM | POA: Diagnosis not present

## 2014-11-27 DIAGNOSIS — E1129 Type 2 diabetes mellitus with other diabetic kidney complication: Secondary | ICD-10-CM | POA: Diagnosis not present

## 2014-11-30 DIAGNOSIS — E1129 Type 2 diabetes mellitus with other diabetic kidney complication: Secondary | ICD-10-CM | POA: Diagnosis not present

## 2014-11-30 DIAGNOSIS — N2581 Secondary hyperparathyroidism of renal origin: Secondary | ICD-10-CM | POA: Diagnosis not present

## 2014-11-30 DIAGNOSIS — N186 End stage renal disease: Secondary | ICD-10-CM | POA: Diagnosis not present

## 2014-11-30 DIAGNOSIS — D631 Anemia in chronic kidney disease: Secondary | ICD-10-CM | POA: Diagnosis not present

## 2014-12-03 DIAGNOSIS — N186 End stage renal disease: Secondary | ICD-10-CM | POA: Diagnosis not present

## 2014-12-03 DIAGNOSIS — N2581 Secondary hyperparathyroidism of renal origin: Secondary | ICD-10-CM | POA: Diagnosis not present

## 2014-12-03 DIAGNOSIS — D631 Anemia in chronic kidney disease: Secondary | ICD-10-CM | POA: Diagnosis not present

## 2014-12-03 DIAGNOSIS — E1129 Type 2 diabetes mellitus with other diabetic kidney complication: Secondary | ICD-10-CM | POA: Diagnosis not present

## 2014-12-05 DIAGNOSIS — E1129 Type 2 diabetes mellitus with other diabetic kidney complication: Secondary | ICD-10-CM | POA: Diagnosis not present

## 2014-12-05 DIAGNOSIS — N186 End stage renal disease: Secondary | ICD-10-CM | POA: Diagnosis not present

## 2014-12-05 DIAGNOSIS — D631 Anemia in chronic kidney disease: Secondary | ICD-10-CM | POA: Diagnosis not present

## 2014-12-05 DIAGNOSIS — N2581 Secondary hyperparathyroidism of renal origin: Secondary | ICD-10-CM | POA: Diagnosis not present

## 2014-12-07 DIAGNOSIS — N2581 Secondary hyperparathyroidism of renal origin: Secondary | ICD-10-CM | POA: Diagnosis not present

## 2014-12-07 DIAGNOSIS — E1129 Type 2 diabetes mellitus with other diabetic kidney complication: Secondary | ICD-10-CM | POA: Diagnosis not present

## 2014-12-07 DIAGNOSIS — D631 Anemia in chronic kidney disease: Secondary | ICD-10-CM | POA: Diagnosis not present

## 2014-12-07 DIAGNOSIS — N186 End stage renal disease: Secondary | ICD-10-CM | POA: Diagnosis not present

## 2014-12-08 DIAGNOSIS — N186 End stage renal disease: Secondary | ICD-10-CM | POA: Diagnosis not present

## 2014-12-08 DIAGNOSIS — E1129 Type 2 diabetes mellitus with other diabetic kidney complication: Secondary | ICD-10-CM | POA: Diagnosis not present

## 2014-12-08 DIAGNOSIS — Z992 Dependence on renal dialysis: Secondary | ICD-10-CM | POA: Diagnosis not present

## 2014-12-09 DIAGNOSIS — E1129 Type 2 diabetes mellitus with other diabetic kidney complication: Secondary | ICD-10-CM | POA: Diagnosis not present

## 2014-12-09 DIAGNOSIS — N2581 Secondary hyperparathyroidism of renal origin: Secondary | ICD-10-CM | POA: Diagnosis not present

## 2014-12-09 DIAGNOSIS — N186 End stage renal disease: Secondary | ICD-10-CM | POA: Diagnosis not present

## 2014-12-09 DIAGNOSIS — D631 Anemia in chronic kidney disease: Secondary | ICD-10-CM | POA: Diagnosis not present

## 2014-12-22 DIAGNOSIS — I871 Compression of vein: Secondary | ICD-10-CM | POA: Diagnosis not present

## 2014-12-22 DIAGNOSIS — Z992 Dependence on renal dialysis: Secondary | ICD-10-CM | POA: Diagnosis not present

## 2014-12-22 DIAGNOSIS — T82858D Stenosis of vascular prosthetic devices, implants and grafts, subsequent encounter: Secondary | ICD-10-CM | POA: Diagnosis not present

## 2014-12-22 DIAGNOSIS — N186 End stage renal disease: Secondary | ICD-10-CM | POA: Diagnosis not present

## 2014-12-27 ENCOUNTER — Telehealth: Payer: Self-pay | Admitting: *Deleted

## 2014-12-27 NOTE — Telephone Encounter (Signed)
Completed/signed forms faxed to [facility] at [phone]. Original mailed to pt and copy sent for scanning. JG//CMA

## 2014-12-28 ENCOUNTER — Other Ambulatory Visit: Payer: Self-pay | Admitting: Cardiology

## 2014-12-31 ENCOUNTER — Encounter (INDEPENDENT_AMBULATORY_CARE_PROVIDER_SITE_OTHER): Payer: Medicare Other | Admitting: Ophthalmology

## 2014-12-31 DIAGNOSIS — E113391 Type 2 diabetes mellitus with moderate nonproliferative diabetic retinopathy without macular edema, right eye: Secondary | ICD-10-CM

## 2014-12-31 DIAGNOSIS — E113312 Type 2 diabetes mellitus with moderate nonproliferative diabetic retinopathy with macular edema, left eye: Secondary | ICD-10-CM

## 2014-12-31 DIAGNOSIS — I10 Essential (primary) hypertension: Secondary | ICD-10-CM

## 2014-12-31 DIAGNOSIS — H35033 Hypertensive retinopathy, bilateral: Secondary | ICD-10-CM | POA: Diagnosis not present

## 2014-12-31 DIAGNOSIS — E11311 Type 2 diabetes mellitus with unspecified diabetic retinopathy with macular edema: Secondary | ICD-10-CM | POA: Diagnosis not present

## 2014-12-31 DIAGNOSIS — H43813 Vitreous degeneration, bilateral: Secondary | ICD-10-CM

## 2015-01-08 DIAGNOSIS — N186 End stage renal disease: Secondary | ICD-10-CM | POA: Diagnosis not present

## 2015-01-08 DIAGNOSIS — E1129 Type 2 diabetes mellitus with other diabetic kidney complication: Secondary | ICD-10-CM | POA: Diagnosis not present

## 2015-01-08 DIAGNOSIS — Z992 Dependence on renal dialysis: Secondary | ICD-10-CM | POA: Diagnosis not present

## 2015-01-11 DIAGNOSIS — D631 Anemia in chronic kidney disease: Secondary | ICD-10-CM | POA: Diagnosis not present

## 2015-01-11 DIAGNOSIS — D509 Iron deficiency anemia, unspecified: Secondary | ICD-10-CM | POA: Diagnosis not present

## 2015-01-11 DIAGNOSIS — N2581 Secondary hyperparathyroidism of renal origin: Secondary | ICD-10-CM | POA: Diagnosis not present

## 2015-01-11 DIAGNOSIS — N186 End stage renal disease: Secondary | ICD-10-CM | POA: Diagnosis not present

## 2015-01-11 DIAGNOSIS — E1129 Type 2 diabetes mellitus with other diabetic kidney complication: Secondary | ICD-10-CM | POA: Diagnosis not present

## 2015-01-26 ENCOUNTER — Ambulatory Visit (INDEPENDENT_AMBULATORY_CARE_PROVIDER_SITE_OTHER): Payer: Medicare Other | Admitting: Cardiology

## 2015-01-26 ENCOUNTER — Encounter: Payer: Self-pay | Admitting: Cardiology

## 2015-01-26 VITALS — BP 146/60 | HR 66 | Ht 65.0 in | Wt 153.0 lb

## 2015-01-26 DIAGNOSIS — I214 Non-ST elevation (NSTEMI) myocardial infarction: Secondary | ICD-10-CM

## 2015-01-26 DIAGNOSIS — I25708 Atherosclerosis of coronary artery bypass graft(s), unspecified, with other forms of angina pectoris: Secondary | ICD-10-CM

## 2015-01-26 DIAGNOSIS — I10 Essential (primary) hypertension: Secondary | ICD-10-CM

## 2015-01-26 DIAGNOSIS — I779 Disorder of arteries and arterioles, unspecified: Secondary | ICD-10-CM

## 2015-01-26 DIAGNOSIS — I25119 Atherosclerotic heart disease of native coronary artery with unspecified angina pectoris: Secondary | ICD-10-CM | POA: Diagnosis not present

## 2015-01-26 DIAGNOSIS — Z789 Other specified health status: Secondary | ICD-10-CM

## 2015-01-26 DIAGNOSIS — I5042 Chronic combined systolic (congestive) and diastolic (congestive) heart failure: Secondary | ICD-10-CM

## 2015-01-26 DIAGNOSIS — I2510 Atherosclerotic heart disease of native coronary artery without angina pectoris: Secondary | ICD-10-CM | POA: Diagnosis not present

## 2015-01-26 DIAGNOSIS — I739 Peripheral vascular disease, unspecified: Secondary | ICD-10-CM

## 2015-01-26 DIAGNOSIS — E785 Hyperlipidemia, unspecified: Secondary | ICD-10-CM

## 2015-01-26 DIAGNOSIS — I209 Angina pectoris, unspecified: Secondary | ICD-10-CM | POA: Diagnosis not present

## 2015-01-26 DIAGNOSIS — T82855D Stenosis of coronary artery stent, subsequent encounter: Secondary | ICD-10-CM

## 2015-01-26 DIAGNOSIS — T82857D Stenosis of cardiac prosthetic devices, implants and grafts, subsequent encounter: Secondary | ICD-10-CM | POA: Diagnosis not present

## 2015-01-26 DIAGNOSIS — I953 Hypotension of hemodialysis: Secondary | ICD-10-CM

## 2015-01-26 DIAGNOSIS — Z9861 Coronary angioplasty status: Secondary | ICD-10-CM

## 2015-01-26 NOTE — Progress Notes (Signed)
PCP: Annye Asa, MD  Clinic Note: Chief Complaint  Patient presents with  . Dizziness  . Coronary Artery Disease    HPI: [name] [name] is a 75 y.o. male with a PMH below who presents today for 3 months f/u of CAD- CABG & recurrent PCI of mRCA ISR.  PCI on the RCA is quite difficult, requires use of GuideLiner catheter - precludes ability to use Rotational Atherectomy. Has had re-do CABG & based upon referral back to Dr. Prescott Gum - not a re-redo candidate.  [name] [name] was last seen on July 25 & referred back to the Cath Lab for PTCA of the dRCA.  Recent Hospitalizations: For Cath --> seen in f/u by Mr. [name] on 8/10 - angina was stable  Studies Reviewed: Cardiac CATH/PTCA August 03, 2014  Mid RCA stented segment has a focal 99% in-stent restenosis at the previous PTCA site. This was treated with balloon angioplasty on reducing 99% to 20% stenosis. Widely patent SVG-OM and LIMA-LAD. There is moderate disease in both the OM and LAD beyond the graft segments and more severe disease upstream of the LAD from retrograde flow.   Carotid Dopplers 10/2014: Stable moderate bilateral ICA stenosis. Repeat 12 months  Interval History: He is overall doing well.  Starting to have to use NTG just a bit more frequently -(~1/day), but no where near previous occasions.  Is able to get around better with less dyspnea,but still pretty limited. Noted bruising @ HD - asked if we can reduce Plavix to qday. Most notable Sx is dizziness & low BP @ HD (as low as mid 80s SBP) -- feels bad when BP is low, has to stay @ HD center until normalizes.   NO CP with HD - just anxious with it. Started on Klonopin.   He has not been very active, but has been more active than usual.  No PND, orthopnea or edema -- HD removes ~6L  No syncope/near syncope, TIA/amaurosis fugax symptoms. No claudication - But is likely not active enough to have claudication.  His wife is not with him b/c she is with her sister,  who has Stage IV/Terminal Lung Cancer.  ROS: A comprehensive was performed. Review of Systems  Constitutional: Positive for malaise/fatigue (mostly on post-HD afternoon. ).  HENT: Negative for congestion.   Eyes: Negative for blurred vision.  Respiratory: Positive for shortness of breath (Every now & then - more on the day after HD due to fatigue ).   Cardiovascular: Positive for chest pain and palpitations (skips beats - but no rapid arrythmia). Negative for leg swelling.  Gastrointestinal: Negative for diarrhea (takes Immodium), blood in stool and melena.  Genitourinary: Negative.   Musculoskeletal: Positive for joint pain (hips - hurt with walking). Negative for falls.  Neurological: Positive for dizziness (standing & occastionally with walking - especially after HD) and headaches (off & on). Negative for focal weakness, seizures and weakness.  Endo/Heme/Allergies: Does not bruise/bleed easily.  Psychiatric/Behavioral: The patient is nervous/anxious (better with Klonopin). The patient does not have insomnia.        At least dysthymia  All other systems reviewed and are negative.   Past Medical History  Diagnosis Date  . Diabetes mellitus   . Hyperlipidemia   . Hypertension   . Gout   . Hypothyroidism     On supplementation  . LBBB (left bundle branch block)     Chronic  . Anemia     Likely secondary to history of GI  bleed  . Stroke ([place]) 1998  . CAD in native artery; and the grafts 1992, 1997, 2002, 2006, 2012    CABG 1992 and redo CABG 2006  . CAD (coronary artery disease) of bypass graft 2006, 2012    In 2006: Occluded SVG-OM noted (SVG-diagonal was previously occluded); 2012: Severe lesion in redo SVG-OM1 --> BMS PCI   . CAD S/P percutaneous coronary angioplasty October 2012; Feb, Apr & Oct 2015; Feb 2016    a) s/p CABG 1992 and redo 2006. b) 10/12: NSTEMI: PCI to SVG-OM1 Integrity BMS 3.5 x 15 (3.85 mm) c) 2/15: UA - PCI mid RCA Xience Ap DES 2.75 x 15 (3.1 mm). d) 4/15 :  UA - focal dRCA Resolute DES 2.25 x 8 (2.5 mm). e) 10/15: PCI to mRCA ISR w/ post stent lesion - Promus P 2.75 x 16 (3.25 mm). f) 2/16: NSTEMI: CBA/PTCA of  mRCA ISR (3.5 mm post-dilation); g) 7/16 ISR RCA->CBA/PTCA, residual 20%.  . Ischemic cardiomyopathy     a) EF 45-50% by echo 2015. b) EF 50% by cath 04/2014.  Marland Kitchen History of Non-ST elevated myocardial infarction (non-STEMI) 1992, 2006, 2012; 02/2013  . Peptic ulcer disease  2004  . BPH (benign prostatic hyperplasia)   . Chronic low back pain   . Carotid arterial disease ([place])     a) Duplex 05/2012: R BULB/PROX ICA: 50-69%, L BULB/PROX ICA: 0-49%. ;; 04/29/2014: [id number] RICA, [id number] LICA. Patetn vertebrals,  . Colon cancer [facility]) 2003    colectomy for CA. no recurrence . never required  radiation or chem  . End stage renal disease on dialysis [facility])     Dialyzes at [facility]: Tues/Th/Sat - left upper cavity AV fistula brachiocephalic  . Plavix resistance     a) P2Y12 264 in 02/2014 - put on Brilinta but did not tolerate due to SOB. b) cannot be on Effient due to history of stroke. c) Plavix increased to 75mg  BID and Pletal added 04/2014 due to ISR.    Past Surgical History  Procedure Laterality Date  . Appendectomy    . Cholecystectomy  2009  . Cataract extraction w/ intraocular lens  implant, bilateral Bilateral   . Posterior lumbar fusion    . Av fistula repair Left 05/2009  . Colon resection  2003    transverse and proximal descending w/ primar anastomosis  . Doppler echocardiography  01/25/2012    EF 50 to 55%  . Nm myocar perf wall motion  10/12/2011    EF 54%,LV normal ; no signifiant ischemia  . Cardiac catheterization  10/16/2010    patent  LIMA to the LAD , PATENT  svg TO 2nd marginals ,occluded PLA with collaterals and SVG to the OM  had 90% stenosis  was txlge  bare - metal  3.5  Integrity ten  postdilate  36-37  mm    . Cardiac catheterization  03/22/2004    loss of both SVG,severe disease in prox and ostial  circ not  amenable to invention. mod diease distal LAD  AFTER BYPASS GRAFT;-CVTS to evaluate   . Coronary angioplasty  04/03/1995    OM and Circ  . Coronary angioplasty  02/01/2000    [facility]IRC  . Carotid doppler  05/23/2012    ABN CAROTID-- RGT BULB/PROX ICA mild to mod 50-60%;lft bulb/prox mild to mod 0-49%;left subclavian abn waveforms consistent with patients lft arm A/V fistula  . Event monitor  01/23/2012-02/06/2012    SINUS ,LBBB,unifocal PVCs  . Percutaneous coronary stent intervention (  pci-s)  02/23/2013    mid RCA 80% & 70% - Xience 2.75 mm x 15 mm ( 3.13mm) ; LIMA-LAD patent, SVG-OM patent (stent patent); SVG-Diag patent.  . Transthoracic echocardiogram  02/23/2013    Moderate concentric hypertrophy. EF 4500%. Septal bounce. Grade 2 diastolic dysfunction (pseudo-normal - severely elevated filling pressures) mild to moderate MR and moderate LA dilation to  . Percutaneous coronary stent intervention (pci-s)  April 2015    dRCA - Integrity Resolute DES   2.25 mm x 23mm (2.5 mm)  . Percutaneous coronary stent intervention (pci-s)  Oct 15 2013    [place] Angina: mRCA ISR with post-stent stenosis -- C[place] PTCA & PCI Promus Premier DES 2.75 mm x 16 mm (3.25 mm)  . Lower extremity arterial dopplers  October 2014    Calcified but non-occlusive peripheral arteries. No evidence of stenosis  . Left heart catheterization with coronary angiogram N/A 02/23/2013    Procedure: LEFT HEART CATHETERIZATION WITH CORONARY ANGIOGRAM;  Surgeon: Lorretta Harp, MD;  Location: [facility] CATH LAB;  Service: Cardiovascular;  Laterality: N/A;  . Left heart catheterization with coronary/graft angiogram N/A 04/30/2013    Procedure: LEFT HEART CATHETERIZATION WITH Beatrix Fetters;  Surgeon: Leonie Man, MD;  Location: [facility] CATH LAB;  Service: Cardiovascular;  Laterality: N/A;  . Left heart catheterization with coronary/graft angiogram N/A 10/15/2013    Procedure: LEFT HEART CATHETERIZATION WITH Beatrix Fetters;   Surgeon: Leonie Man, MD;  Location: [facility] CATH LAB;  Service: Cardiovascular;  Laterality: N/A;  . Left heart catheterization with coronary/graft angiogram N/A 02/09/2014    Procedure: LEFT HEART CATHETERIZATION WITH Beatrix Fetters;  Surgeon: Leonie Man, MD;  Location: [facility] CATH LAB;  Service: Cardiovascular;  Laterality: N/A;  . Coronary angioplasty  02/09/14    Cutting Balloon PTCA of mRCA ISR 99% - 3.5 mm  . Colon surgery  2003    "cancer"  . Coronary artery bypass graft  08/22/1990    INITIAL:LIMA to LAD, SVG to  Diagonal, SVG to OM  . Coronary artery bypass graft  2006    SVG to OM1,SVG to OM2 with patent LIMA  to LAD and occluded vein  graft to the diagonal  and occluded vein grft to OM from  prior  surgery  . Back surgery    . Left and right heart catheterization with coronary/graft angiogram N/A 04/20/2014    Procedure: LEFT AND RIGHT HEART CATHETERIZATION WITH Beatrix Fetters;  Surgeon: Sherren Mocha, MD;  Location: [facility] CATH LAB;  Service: Cardiovascular;  Laterality: N/A;  . Cardiac catheterization N/A 08/03/2014    Procedure: Left Heart Cath and Cors/Grafts Angiography;  Surgeon: Leonie Man, MD;  Location: [place] CV LAB;  Service: Cardiovascular;  Laterality: N/A;  . Cardiac catheterization N/A 08/03/2014    Procedure: Coronary Balloon Angioplasty;  Surgeon: Leonie Man, MD;  Location: [place] CV LAB;  Service: Cardiovascular;  Laterality: N/A;   Prior to Admission medications   Medication Sig Start Date End Date Taking? Authorizing Provider  aspirin 81 MG chewable tablet Chew 1 tablet (81 mg total) by mouth daily. Patient taking differently: Chew 81 mg by mouth at bedtime.  04/21/14   Dayna N Dunn, PA-C  atorvastatin (LIPITOR) 40 MG tablet Take 1 tablet (40 mg total) by mouth at bedtime. 07/21/14   Midge Minium, MD  Besifloxacin HCl (BESIVANCE) 0.6 % SUSP Place 1 drop into both eyes See admin instructions. Use eye drops 4 times daily for 2  days following injection  by Dr. Zigmund Daniel (every 10 weeks)    Historical Provider, MD  carvedilol (COREG) 25 MG tablet TAKE ONE TABLET BY MOUTH TWICE DAILY WITH A MEAL 09/20/14   Midge Minium, MD  cilostazol (PLETAL) 100 MG tablet Take 1 tablet (100 mg total) by mouth 2 (two) times daily. 05/13/14   Leonie Man, MD  clonazePAM (KLONOPIN) 0.5 MG tablet Take 0.5 tablets (0.25 mg total) by mouth daily as needed for anxiety. 09/07/14   Midge Minium, MD  clopidogrel (PLAVIX) 75 MG tablet Take 1 tablet (75 mg total) by mouth 2 (two) times daily. 04/21/14   Dayna N Dunn, PA-C  glucose blood (PRODIGY NO CODING BLOOD GLUC) test strip Test as directed 12/17/11   Midge Minium, MD  insulin NPH Human (HUMULIN N,NOVOLIN N) 100 UNIT/ML injection Inject 10-20 Units into the skin 2 (two) times daily as needed (CBG >130). CBG 130-150 inject 10 units, >150 inject 20 units    Historical Provider, MD  isosorbide mononitrate (IMDUR) 120 MG 24 hr tablet Take 1 tablet (120 mg total) by mouth daily. 05/13/14   Leonie Man, MD  levothyroxine (SYNTHROID, LEVOTHROID) 125 MCG tablet TAKE ONE TABLET BY MOUTH ONCE DAILY BEFORE BREAKFAST 05/10/14   Midge Minium, MD  lidocaine-prilocaine (EMLA) cream Apply 1 application topically See admin instructions. Apply prior to dialysis treatments on Tuesday, Thursday and Saturday    Historical Provider, MD  loperamide (IMODIUM A-D) 2 MG tablet Take 4 mg by mouth 2 (two) times daily as needed for diarrhea or loose stools.    Historical Provider, MD  multivitamin (RENA-VIT) TABS tablet Take 1 tablet by mouth daily. 12/17/11   Midge Minium, MD  nitroGLYCERIN (NITROSTAT) 0.4 MG SL tablet Place 1 tablet (0.4 mg total) under the tongue every 5 (five) minutes as needed for chest pain. 11/18/13   Midge Minium, MD  sevelamer carbonate (RENVELA) 800 MG tablet Take 1,600-2,400 mg by mouth See admin instructions. Take 3 tablets (2400 mg) with meals and 2 tablets (1600 mg)  with snacks    Historical Provider, MD   Allergies  Allergen Reactions  . Brilinta [Ticagrelor] Shortness Of Breath  . Shellfish Allergy Other (See Comments)    Causes gout flare-ups     Social History   Social History  . Marital Status: Married    Spouse Name: N/A  . Number of Children: 3  . Years of Education: N/A   Social History Main Topics  . Smoking status: Never Smoker   . Smokeless tobacco: Never Used  . Alcohol Use: No  . Drug Use: No  . Sexual Activity: No   Other Topics Concern  . None   Social History Narrative   Long-term patient of Dr. Rollene Fare.   Married father of 43, grandfather 71.   Never smoked.   Not exercising D2 hip and back pain & now Angina    Family History  Problem Relation Age of Onset  . Heart disease Mother   . Hypertension Mother   . Diabetes Mother   . Heart disease Father   . Hypertension Father   . Diabetes Father   . Heart attack Mother   . Heart attack Father   . Stroke Paternal Aunt   . Stroke Paternal Uncle     Wt Readings from Last 3 Encounters:  01/26/15 153 lb (69.4 kg)  11/05/14 152 lb 2 oz (69.003 kg)  10/25/14 155 lb (70.308 kg)    PHYSICAL EXAM BP 146/60  mmHg  Pulse 66  Ht 5\' 5"  (1.651 m)  Wt 153 lb (69.4 kg)  BMI 25.46 kg/m2 General appearance: alert, cooperative, appears stated age, no distress; and Borderline obese. Chronically ill-appearing, pale, well groomed.Marland Kitchen answers questions appropriately.  Neck: no adenopathy, no carotid bruit, mild JVD; Supple, symmetrical, trachea midline  Lungs: CTAB, normal percussion bilaterally and Nonlabored, good air movement  Heart: normal apical impulse, RRR, S1, S2 normal, S4 present, SEM 2/6, crescendo, decrescendo and harsh at 2nd right intercostal space, no click and no rub  Abdomen: soft, non-tender; bowel sounds normal; no masses, no organomegaly and Mild truncal obesity  Extremities: extremities normal, atraumatic, no cyanosis or edema and fistula in the left  upper arm with good thrill  Pulses: 2+ and symmetric  Neurologic: Grossly normal Pscyh;More up-beat mood & affect    Adult ECG Report  Rate:73 ;  Rhythm: normal sinus rhythm and LBBB. Otherwise normal Axis, intervals & durations.;   Narrative Interpretation: Stable EKG   Other studies Reviewed: Additional studies/ records that were reviewed today include:  Recent Labs:   Lab Results  Component Value Date   CHOL 71 07/21/2014   HDL 28.70* 07/21/2014   LDLCALC 22 07/21/2014   LDLDIRECT 33.5 12/22/2012   TRIG 104.0 07/21/2014   CHOLHDL 2 07/21/2014    ASSESSMENT / PLAN: Problem List Items Addressed This Visit    Plavix resistance (Chronic)    He is having a lot of bruising and bleeding issues at dialysis while on twice a day Plavix. He is far enough out now we can reduce to once a day.      Hypotension of hemodialysis (Chronic)    Plan: Reduce carvedilol dose on the predialysis evening to 12.5 mg.      Hyperlipidemia with target LDL less than 70 (Chronic)    Overall pretty good control. Total cholesterol and LDL are well within goal. The main issue is that his HDL is low. I think is partly because his total social is very low. Continues atorvastatin.      Relevant Orders   EKG 12-Lead (Completed)   History of Non-ST elevated myocardial infarction (non-STEMI) (Chronic)    Despite having multiple non-ST elevation MIs in the past, he still maintains relatively normal EF and no significant heart failure symptoms. At this point I think we'll just simply monitor. His symptoms usually push [place] to doing something with invasive evaluation. In that case I would probably check an LV gram.      Essential hypertension (Chronic)    Borderline pressures on nondialysis days, but is hypotensive in dialysis. Reduce beta blocker dose predialysis.      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 I suspect that his initial stent is not adequately deployed, and therefore we continue to have an issue there. Unfortunately, it is very difficult to get down to the this lesion, and requires a guide liner. May need to consider rotational atherectomy and stenting of the proximal portion of the RCA which was then also allow [place] to advance the rotational atherectomy catheter into the distal portion of the stent for rotational atherectomy and debulking.      Coronary artery disease involving native coronary artery of native heart with angina pectoris (HCC) (Chronic)    He has persisting coronary artery disease, but most recently has been troubled with in-stent restenosis. Extensive stenting in  the RCA as well as in grafts. He is now far enough out from his last intervention that we can reduce his Plavix to daily from twice a day. He is on carvedilol which will adjust some dosing. He is on high-dose Imdur, but intolerant of Ranexa. Does not have blood pressure room to do afterload reduction with ACE inhibitor or ARB. Limited usefulness in this setting. He gets dizzy in dialysis with hypotension, therefore I'm reluctant to add any more blood pressure medicine.  He is on moderate dose statin with good cholesterol control.      Chronic combined systolic and diastolic congestive heart failure, NYHA class 2 (HCC) (Chronic)    He actually is doing relatively well his volume control as is stable with dialysis. They do pull off a significant amount of fluid and therefore he is hypotensive in dialysis. We will address this. Had to back off on beta blocker on the predialysis evenings.      Carotid arterial disease ([place])    Carotid Dopplers from last October were stable. Plan is to follow annually.      Relevant Orders   EKG 12-Lead (Completed)   CAD S/P percutaneous coronary angioplasty - Primary (Chronic)   Relevant Orders   EKG 12-Lead (Completed)   CAD (coronary artery disease)  of bypass graft - PCI to SVG-OM with BMS; PCI-mid RCA with a Xience DES (Chronic)   Angina, class II - stable (Chronic)    Actually doing pretty well with not noting significant use of when necessary nitroglycerin. At this point I think we continue to monitor. Would only act if symptoms were to worsen.         Current medicines are reviewed at length with the patient today. (+/- concerns) none The following changes have been made: none Studies Ordered:  BECAUSE YOU BECOME DIZZY AT DIALYSIS-  DECREASE CARVEDILOL TO 1/2 TABLET OF 25 MG THE NIGHT BEFORE DIALYSIS.  IF YOU FEEL TIRED AFTER YOUR DIALYSIS DAY- YOU CAN TAKE 1/2 OF YOUR CARVEDILOL THAT AFTERNOON. OKAY TO TAKE PLAVIX (CLOPIDOGREL) 75 MG  ONE TABLET DAILY ONLY.  NO OTHER CHANGES  Your physician wants you to follow-up in [place].  Orders Placed This Encounter  Procedures  . EKG 12-Lead     Leonie Man, M.D., M.S. Interventional Cardiologist    Pager # [phone]

## 2015-01-26 NOTE — Patient Instructions (Addendum)
BECAUSE YOU BECOME DIZZY AT DIALYSIS-  DECREASE CARVEDILOL TO 1/2 TABLET OF 25 MG THE NIGHT BEFORE DIALYSIS.  OTHERWISE CONTINUE WITH CURRENT MEDICATIONS  OKAY TO TAKE PLAVIX (CLOPIDOGREL) 75 MG  ONE TABLET DAILY ONLY.   Your physician recommends that you schedule a follow-up appointment in [place]- 30 MIN   If you need a refill on your cardiac medications before your next appointment, please call your pharmacy.

## 2015-01-28 ENCOUNTER — Encounter: Payer: Self-pay | Admitting: Cardiology

## 2015-01-28 DIAGNOSIS — I953 Hypotension of hemodialysis: Secondary | ICD-10-CM | POA: Insufficient documentation

## 2015-01-28 DIAGNOSIS — T82855A Stenosis of coronary artery stent, initial encounter: Secondary | ICD-10-CM | POA: Insufficient documentation

## 2015-01-28 NOTE — Assessment & Plan Note (Signed)
Plan: Reduce carvedilol dose on the predialysis evening to 12.5 mg.

## 2015-01-28 NOTE — Assessment & Plan Note (Addendum)
Borderline pressures on nondialysis days, but is hypotensive in dialysis. Reduce beta blocker dose predialysis.

## 2015-01-28 NOTE — Assessment & Plan Note (Signed)
Despite having multiple non-ST elevation MIs in the past, he still maintains relatively normal EF and no significant heart failure symptoms. At this point I think we'll just simply monitor. His symptoms usually push [place] to doing something with invasive evaluation. In that case I would probably check an LV gram.

## 2015-01-28 NOTE — Assessment & Plan Note (Signed)
Carotid Dopplers from last October were stable. Plan is to follow annually.

## 2015-01-28 NOTE — Assessment & Plan Note (Signed)
He has persisting coronary artery disease, but most recently has been troubled with in-stent restenosis. Extensive stenting in the RCA as well as in grafts. He is now far enough out from his last intervention that we can reduce his Plavix to daily from twice a day. He is on carvedilol which will adjust some dosing. He is on high-dose Imdur, but intolerant of Ranexa. Does not have blood pressure room to do afterload reduction with ACE inhibitor or ARB. Limited usefulness in this setting. He gets dizzy in dialysis with hypotension, therefore I'm reluctant to add any more blood pressure medicine.  He is on moderate dose statin with good cholesterol control.

## 2015-01-28 NOTE — Assessment & Plan Note (Signed)
He actually is doing relatively well his volume control as is stable with dialysis. They do pull off a significant amount of fluid and therefore he is hypotensive in dialysis. We will address this. Had to back off on beta blocker on the predialysis evenings.

## 2015-01-28 NOTE — Assessment & Plan Note (Signed)
Several repeat interventions on the distal mid RCA lesion. I suspect that his initial stent is not adequately deployed, and therefore we continue to have an issue there. Unfortunately, it is very difficult to get down to the this lesion, and requires a guide liner. May need to consider rotational atherectomy and stenting of the proximal portion of the RCA which was then also allow [place] to advance the rotational atherectomy catheter into the distal portion of the stent for rotational atherectomy and debulking.

## 2015-01-28 NOTE — Assessment & Plan Note (Signed)
Actually doing pretty well with not noting significant use of when necessary nitroglycerin. At this point I think we continue to monitor. Would only act if symptoms were to worsen.

## 2015-01-28 NOTE — Assessment & Plan Note (Signed)
He is having a lot of bruising and bleeding issues at dialysis while on twice a day Plavix. He is far enough out now we can reduce to once a day.

## 2015-01-28 NOTE — Assessment & Plan Note (Signed)
Overall pretty good control. Total cholesterol and LDL are well within goal. The main issue is that his HDL is low. I think is partly because his total social is very low. Continues atorvastatin.

## 2015-01-31 ENCOUNTER — Other Ambulatory Visit: Payer: Self-pay | Admitting: Family Medicine

## 2015-02-01 NOTE — Telephone Encounter (Signed)
Last OV 11/05/14 Clonazepam last filled 09/07/14 #30 with 1

## 2015-02-02 ENCOUNTER — Other Ambulatory Visit: Payer: Self-pay | Admitting: Family Medicine

## 2015-02-03 DIAGNOSIS — E1129 Type 2 diabetes mellitus with other diabetic kidney complication: Secondary | ICD-10-CM | POA: Diagnosis not present

## 2015-02-08 DIAGNOSIS — Z992 Dependence on renal dialysis: Secondary | ICD-10-CM | POA: Diagnosis not present

## 2015-02-08 DIAGNOSIS — N186 End stage renal disease: Secondary | ICD-10-CM | POA: Diagnosis not present

## 2015-02-08 DIAGNOSIS — E1129 Type 2 diabetes mellitus with other diabetic kidney complication: Secondary | ICD-10-CM | POA: Diagnosis not present

## 2015-02-10 DIAGNOSIS — E1129 Type 2 diabetes mellitus with other diabetic kidney complication: Secondary | ICD-10-CM | POA: Diagnosis not present

## 2015-02-10 DIAGNOSIS — N186 End stage renal disease: Secondary | ICD-10-CM | POA: Diagnosis not present

## 2015-02-10 DIAGNOSIS — D509 Iron deficiency anemia, unspecified: Secondary | ICD-10-CM | POA: Diagnosis not present

## 2015-02-10 DIAGNOSIS — D631 Anemia in chronic kidney disease: Secondary | ICD-10-CM | POA: Diagnosis not present

## 2015-02-10 DIAGNOSIS — N2581 Secondary hyperparathyroidism of renal origin: Secondary | ICD-10-CM | POA: Diagnosis not present

## 2015-03-05 DIAGNOSIS — N186 End stage renal disease: Secondary | ICD-10-CM | POA: Diagnosis not present

## 2015-03-08 DIAGNOSIS — Z992 Dependence on renal dialysis: Secondary | ICD-10-CM | POA: Diagnosis not present

## 2015-03-08 DIAGNOSIS — E1129 Type 2 diabetes mellitus with other diabetic kidney complication: Secondary | ICD-10-CM | POA: Diagnosis not present

## 2015-03-08 DIAGNOSIS — N186 End stage renal disease: Secondary | ICD-10-CM | POA: Diagnosis not present

## 2015-03-09 ENCOUNTER — Ambulatory Visit (HOSPITAL_BASED_OUTPATIENT_CLINIC_OR_DEPARTMENT_OTHER)
Admission: RE | Admit: 2015-03-09 | Discharge: 2015-03-09 | Disposition: A | Discharge status: Home / Self Care | Payer: Medicare Other | Source: Ambulatory Visit | Attending: Family Medicine | Admitting: Family Medicine

## 2015-03-09 ENCOUNTER — Encounter: Payer: Self-pay | Admitting: Family Medicine

## 2015-03-09 ENCOUNTER — Telehealth: Payer: Self-pay | Admitting: Emergency Medicine

## 2015-03-09 ENCOUNTER — Ambulatory Visit (INDEPENDENT_AMBULATORY_CARE_PROVIDER_SITE_OTHER): Payer: Medicare Other | Admitting: Family Medicine

## 2015-03-09 VITALS — BP 138/66 | HR 71 | Temp 97.9°F | Resp 16 | Ht 65.0 in | Wt 151.2 lb

## 2015-03-09 DIAGNOSIS — M25512 Pain in left shoulder: Secondary | ICD-10-CM | POA: Diagnosis not present

## 2015-03-09 DIAGNOSIS — M19012 Primary osteoarthritis, left shoulder: Secondary | ICD-10-CM | POA: Insufficient documentation

## 2015-03-09 DIAGNOSIS — I209 Angina pectoris, unspecified: Secondary | ICD-10-CM | POA: Diagnosis not present

## 2015-03-09 DIAGNOSIS — Z951 Presence of aortocoronary bypass graft: Secondary | ICD-10-CM | POA: Insufficient documentation

## 2015-03-09 DIAGNOSIS — E0840 Diabetes mellitus due to underlying condition with diabetic neuropathy, unspecified: Secondary | ICD-10-CM | POA: Diagnosis not present

## 2015-03-09 DIAGNOSIS — E785 Hyperlipidemia, unspecified: Secondary | ICD-10-CM | POA: Diagnosis not present

## 2015-03-09 DIAGNOSIS — Z794 Long term (current) use of insulin: Secondary | ICD-10-CM | POA: Diagnosis not present

## 2015-03-09 DIAGNOSIS — I10 Essential (primary) hypertension: Secondary | ICD-10-CM

## 2015-03-09 LAB — BASIC METABOLIC PANEL
BUN: 37 mg/dL — AB (ref 6–23)
CO2: 31 mEq/L (ref 19–32)
CREATININE: 5.19 mg/dL — AB (ref 0.40–1.50)
Calcium: 10.1 mg/dL (ref 8.4–10.5)
Chloride: 95 mEq/L — ABNORMAL LOW (ref 96–112)
GFR: 11.61 mL/min — AB (ref >60.00)
GLUCOSE: 153 mg/dL — AB (ref 70–99)
POTASSIUM: 4.1 meq/L (ref 3.5–5.1)
Sodium: 136 mEq/L (ref 135–145)

## 2015-03-09 LAB — LIPID PANEL
CHOL/HDL RATIO: 3
CHOLESTEROL: 80 mg/dL (ref 0–200)
HDL: 24.6 mg/dL — AB (ref >39.00)
LDL CALC: 31 mg/dL (ref 0–99)
NonHDL: 55.73
TRIGLYCERIDES: 122 mg/dL (ref 0.0–149.0)
VLDL: 24.4 mg/dL (ref 0.0–40.0)

## 2015-03-09 LAB — CBC WITH DIFFERENTIAL/PLATELET
Basophils Absolute: 0 10*3/uL (ref 0.0–0.1)
Basophils Relative: 0.1 % (ref 0.0–3.0)
Eosinophils Absolute: 0.3 10*3/uL (ref 0.0–0.7)
Eosinophils Relative: 4.4 % (ref 0.0–5.0)
HCT: 32.5 % — ABNORMAL LOW (ref 39.0–52.0)
Hemoglobin: 11.1 g/dL — ABNORMAL LOW (ref 13.0–17.0)
LYMPHS ABS: 0.8 10*3/uL (ref 0.7–4.0)
Lymphocytes Relative: 12.2 % (ref 12.0–46.0)
MCHC: 34 g/dL (ref 30.0–36.0)
MCV: 93.1 fl (ref 78.0–100.0)
MONO ABS: 0.7 10*3/uL (ref 0.1–1.0)
MONOS PCT: 10.8 % (ref 3.0–12.0)
Neutro Abs: 4.9 10*3/uL (ref 1.4–7.7)
Neutrophils Relative %: 72.5 % (ref 43.0–77.0)
PLATELETS: 280 10*3/uL (ref 150.0–400.0)
RBC: 3.49 Mil/uL — ABNORMAL LOW (ref 4.22–5.81)
RDW: 15.1 % (ref 11.5–15.5)
WBC: 6.7 10*3/uL (ref 4.0–10.5)

## 2015-03-09 LAB — TSH: TSH: 0.61 u[IU]/mL (ref 0.35–4.50)

## 2015-03-09 LAB — HEPATIC FUNCTION PANEL
ALBUMIN: 4.3 g/dL (ref 3.5–5.2)
ALT: 10 U/L (ref 0–53)
AST: 13 U/L (ref 0–37)
Alkaline Phosphatase: 71 U/L (ref 39–117)
Bilirubin, Direct: 0.2 mg/dL (ref 0.0–0.3)
Total Bilirubin: 0.4 mg/dL (ref 0.2–1.2)
Total Protein: 7.3 g/dL (ref 6.0–8.3)

## 2015-03-09 LAB — HEMOGLOBIN A1C: Hgb A1c MFr Bld: 5.7 % (ref 4.6–6.5)

## 2015-03-09 NOTE — Patient Instructions (Signed)
Follow up in 3-4 months to recheck diabetes We'll notify you of your lab results and make any changes if needed Go downstairs and get your shoulder xray Keep up the good work!  You look great! Call with any questions or concerns If you want to join [place] at the new [place] office, any scheduled appointments will automatically transfer and we will see you at [address] [place] [address] [place], [place] [address] ([place]) Have a great spring season!

## 2015-03-09 NOTE — Progress Notes (Signed)
Pre visit review using our clinic review tool, if applicable. No additional management support is needed unless otherwise documented below in the visit note. 

## 2015-03-09 NOTE — Telephone Encounter (Signed)
[facility] called with Critical Creat.5.19 & GFR 11.61, pts BUN was 37...Marland KitchenMarland KitchenKMP

## 2015-03-09 NOTE — Progress Notes (Signed)
   Subjective:    Patient ID: [name] [name], male    DOB: [birth year], 75 y.o.   MRN: [id number]  HPI DM- chronic problem, on NPH insulin BID based on sliding scale.  UTD on foot exam, eye exam (has scheduled for this month).  No need for microalbumin due to HD.  Fasting CBG this AM 172.  Denies symptomatic lows.  Persistent numbness/tingling of feet.  HTN- chronic problem, on Coreg BID.  Denies CP above baseline, SOB, HAs, visual changes, edema.  Hyperlipidemia- chronic problem, on Lipitor.  No abd pain, N/V.  L shoulder pain- pt reports pain from elbow to shoulder.  Pt reports pain since getting vascular stent proximal to his AV fistula.  Pain w/ raising arm or opening car door.  Pt was told by Dr C at HD to have xray to assess joint prior to revisiting the stent issue.   Review of Systems For ROS see HPI     Objective:   Physical Exam  Constitutional: He is oriented to person, place, and time. He appears well-developed and well-nourished. No distress.  HENT:  Head: Normocephalic and atraumatic.  Eyes: Conjunctivae and EOM are normal. Pupils are equal, round, and reactive to light.  Neck: Normal range of motion. Neck supple. No thyromegaly present.  Cardiovascular: Normal rate, regular rhythm and intact distal pulses.   Murmur (II/VI SEM) heard. Pulmonary/Chest: Effort normal and breath sounds normal. No respiratory distress.  Abdominal: Soft. Bowel sounds are normal. He exhibits no distension.  Musculoskeletal: He exhibits no edema.  L shoulder pain w/ forward flexion/abduction  Lymphadenopathy:    He has no cervical adenopathy.  Neurological: He is alert and oriented to person, place, and time. No cranial nerve deficit.  Skin: Skin is warm and dry.  Psychiatric: He has a normal mood and affect. His behavior is normal.  Vitals reviewed.         Assessment & Plan:

## 2015-03-09 NOTE — Assessment & Plan Note (Signed)
Chronic problem.  Well controlled.  Asymptomatic today.  No med changes at this time.

## 2015-03-09 NOTE — Assessment & Plan Note (Signed)
New.  Pt's sxs started after subclavian stent placement.  Will get xray to r/o bony abnormality but I suspect given the time course of pain, he will need to have this stent evaluated.  Pt expressed understanding and is in agreement w/ plan.

## 2015-03-09 NOTE — Assessment & Plan Note (Signed)
Chronic problem.  Pt reports good control of CBGs.  Denies symptomatic lows.  UTD on eye exam, foot exam (repeat eye exam scheduled for 3/17).  No need for microalbumin due to HD.  Check labs.  Adjust meds prn

## 2015-03-09 NOTE — Assessment & Plan Note (Signed)
Chronic problem.  Tolerating statin w/o difficulty.  Check labs.  Adjust meds prn  

## 2015-03-10 DIAGNOSIS — N186 End stage renal disease: Secondary | ICD-10-CM | POA: Diagnosis not present

## 2015-03-10 DIAGNOSIS — D631 Anemia in chronic kidney disease: Secondary | ICD-10-CM | POA: Diagnosis not present

## 2015-03-10 DIAGNOSIS — E1129 Type 2 diabetes mellitus with other diabetic kidney complication: Secondary | ICD-10-CM | POA: Diagnosis not present

## 2015-03-10 DIAGNOSIS — D509 Iron deficiency anemia, unspecified: Secondary | ICD-10-CM | POA: Diagnosis not present

## 2015-03-10 DIAGNOSIS — N2581 Secondary hyperparathyroidism of renal origin: Secondary | ICD-10-CM | POA: Diagnosis not present

## 2015-03-12 DIAGNOSIS — N186 End stage renal disease: Secondary | ICD-10-CM | POA: Diagnosis not present

## 2015-03-12 DIAGNOSIS — E1129 Type 2 diabetes mellitus with other diabetic kidney complication: Secondary | ICD-10-CM | POA: Diagnosis not present

## 2015-03-12 DIAGNOSIS — D631 Anemia in chronic kidney disease: Secondary | ICD-10-CM | POA: Diagnosis not present

## 2015-03-12 DIAGNOSIS — N2581 Secondary hyperparathyroidism of renal origin: Secondary | ICD-10-CM | POA: Diagnosis not present

## 2015-03-12 DIAGNOSIS — D509 Iron deficiency anemia, unspecified: Secondary | ICD-10-CM | POA: Diagnosis not present

## 2015-03-15 DIAGNOSIS — D509 Iron deficiency anemia, unspecified: Secondary | ICD-10-CM | POA: Diagnosis not present

## 2015-03-15 DIAGNOSIS — N186 End stage renal disease: Secondary | ICD-10-CM | POA: Diagnosis not present

## 2015-03-15 DIAGNOSIS — N2581 Secondary hyperparathyroidism of renal origin: Secondary | ICD-10-CM | POA: Diagnosis not present

## 2015-03-15 DIAGNOSIS — D631 Anemia in chronic kidney disease: Secondary | ICD-10-CM | POA: Diagnosis not present

## 2015-03-15 DIAGNOSIS — E1129 Type 2 diabetes mellitus with other diabetic kidney complication: Secondary | ICD-10-CM | POA: Diagnosis not present

## 2015-03-17 DIAGNOSIS — D509 Iron deficiency anemia, unspecified: Secondary | ICD-10-CM | POA: Diagnosis not present

## 2015-03-17 DIAGNOSIS — N186 End stage renal disease: Secondary | ICD-10-CM | POA: Diagnosis not present

## 2015-03-17 DIAGNOSIS — E1129 Type 2 diabetes mellitus with other diabetic kidney complication: Secondary | ICD-10-CM | POA: Diagnosis not present

## 2015-03-17 DIAGNOSIS — N2581 Secondary hyperparathyroidism of renal origin: Secondary | ICD-10-CM | POA: Diagnosis not present

## 2015-03-17 DIAGNOSIS — D631 Anemia in chronic kidney disease: Secondary | ICD-10-CM | POA: Diagnosis not present

## 2015-03-19 DIAGNOSIS — D509 Iron deficiency anemia, unspecified: Secondary | ICD-10-CM | POA: Diagnosis not present

## 2015-03-19 DIAGNOSIS — D631 Anemia in chronic kidney disease: Secondary | ICD-10-CM | POA: Diagnosis not present

## 2015-03-19 DIAGNOSIS — E1129 Type 2 diabetes mellitus with other diabetic kidney complication: Secondary | ICD-10-CM | POA: Diagnosis not present

## 2015-03-19 DIAGNOSIS — N2581 Secondary hyperparathyroidism of renal origin: Secondary | ICD-10-CM | POA: Diagnosis not present

## 2015-03-19 DIAGNOSIS — N186 End stage renal disease: Secondary | ICD-10-CM | POA: Diagnosis not present

## 2015-03-22 DIAGNOSIS — N186 End stage renal disease: Secondary | ICD-10-CM | POA: Diagnosis not present

## 2015-03-22 DIAGNOSIS — N2581 Secondary hyperparathyroidism of renal origin: Secondary | ICD-10-CM | POA: Diagnosis not present

## 2015-03-22 DIAGNOSIS — D509 Iron deficiency anemia, unspecified: Secondary | ICD-10-CM | POA: Diagnosis not present

## 2015-03-22 DIAGNOSIS — D631 Anemia in chronic kidney disease: Secondary | ICD-10-CM | POA: Diagnosis not present

## 2015-03-22 DIAGNOSIS — E1129 Type 2 diabetes mellitus with other diabetic kidney complication: Secondary | ICD-10-CM | POA: Diagnosis not present

## 2015-03-24 DIAGNOSIS — N2581 Secondary hyperparathyroidism of renal origin: Secondary | ICD-10-CM | POA: Diagnosis not present

## 2015-03-24 DIAGNOSIS — N186 End stage renal disease: Secondary | ICD-10-CM | POA: Diagnosis not present

## 2015-03-24 DIAGNOSIS — D509 Iron deficiency anemia, unspecified: Secondary | ICD-10-CM | POA: Diagnosis not present

## 2015-03-24 DIAGNOSIS — D631 Anemia in chronic kidney disease: Secondary | ICD-10-CM | POA: Diagnosis not present

## 2015-03-24 DIAGNOSIS — E1129 Type 2 diabetes mellitus with other diabetic kidney complication: Secondary | ICD-10-CM | POA: Diagnosis not present

## 2015-03-25 ENCOUNTER — Encounter (INDEPENDENT_AMBULATORY_CARE_PROVIDER_SITE_OTHER): Payer: Medicare Other | Admitting: Ophthalmology

## 2015-03-25 DIAGNOSIS — H35033 Hypertensive retinopathy, bilateral: Secondary | ICD-10-CM

## 2015-03-25 DIAGNOSIS — I10 Essential (primary) hypertension: Secondary | ICD-10-CM | POA: Diagnosis not present

## 2015-03-25 DIAGNOSIS — H43813 Vitreous degeneration, bilateral: Secondary | ICD-10-CM

## 2015-03-25 DIAGNOSIS — E11311 Type 2 diabetes mellitus with unspecified diabetic retinopathy with macular edema: Secondary | ICD-10-CM

## 2015-03-25 DIAGNOSIS — E113313 Type 2 diabetes mellitus with moderate nonproliferative diabetic retinopathy with macular edema, bilateral: Secondary | ICD-10-CM

## 2015-03-25 LAB — HM DIABETES EYE EXAM

## 2015-03-26 DIAGNOSIS — N2581 Secondary hyperparathyroidism of renal origin: Secondary | ICD-10-CM | POA: Diagnosis not present

## 2015-03-26 DIAGNOSIS — N186 End stage renal disease: Secondary | ICD-10-CM | POA: Diagnosis not present

## 2015-03-28 ENCOUNTER — Other Ambulatory Visit: Payer: Self-pay | Admitting: Cardiology

## 2015-03-28 NOTE — Telephone Encounter (Signed)
Rx(s) sent to pharmacy electronically.  

## 2015-03-29 DIAGNOSIS — E1129 Type 2 diabetes mellitus with other diabetic kidney complication: Secondary | ICD-10-CM | POA: Diagnosis not present

## 2015-03-29 DIAGNOSIS — D509 Iron deficiency anemia, unspecified: Secondary | ICD-10-CM | POA: Diagnosis not present

## 2015-03-29 DIAGNOSIS — N2581 Secondary hyperparathyroidism of renal origin: Secondary | ICD-10-CM | POA: Diagnosis not present

## 2015-03-29 DIAGNOSIS — D631 Anemia in chronic kidney disease: Secondary | ICD-10-CM | POA: Diagnosis not present

## 2015-03-29 DIAGNOSIS — N186 End stage renal disease: Secondary | ICD-10-CM | POA: Diagnosis not present

## 2015-03-31 DIAGNOSIS — N186 End stage renal disease: Secondary | ICD-10-CM | POA: Diagnosis not present

## 2015-03-31 DIAGNOSIS — D631 Anemia in chronic kidney disease: Secondary | ICD-10-CM | POA: Diagnosis not present

## 2015-03-31 DIAGNOSIS — N2581 Secondary hyperparathyroidism of renal origin: Secondary | ICD-10-CM | POA: Diagnosis not present

## 2015-03-31 DIAGNOSIS — D509 Iron deficiency anemia, unspecified: Secondary | ICD-10-CM | POA: Diagnosis not present

## 2015-03-31 DIAGNOSIS — E1129 Type 2 diabetes mellitus with other diabetic kidney complication: Secondary | ICD-10-CM | POA: Diagnosis not present

## 2015-04-02 DIAGNOSIS — D631 Anemia in chronic kidney disease: Secondary | ICD-10-CM | POA: Diagnosis not present

## 2015-04-02 DIAGNOSIS — N2581 Secondary hyperparathyroidism of renal origin: Secondary | ICD-10-CM | POA: Diagnosis not present

## 2015-04-02 DIAGNOSIS — D509 Iron deficiency anemia, unspecified: Secondary | ICD-10-CM | POA: Diagnosis not present

## 2015-04-02 DIAGNOSIS — N186 End stage renal disease: Secondary | ICD-10-CM | POA: Diagnosis not present

## 2015-04-02 DIAGNOSIS — E1129 Type 2 diabetes mellitus with other diabetic kidney complication: Secondary | ICD-10-CM | POA: Diagnosis not present

## 2015-04-03 ENCOUNTER — Other Ambulatory Visit: Payer: Self-pay | Admitting: Family Medicine

## 2015-04-04 ENCOUNTER — Encounter: Payer: Self-pay | Admitting: General Practice

## 2015-04-04 NOTE — Telephone Encounter (Signed)
Medication filled to pharmacy as requested.   

## 2015-04-05 DIAGNOSIS — N186 End stage renal disease: Secondary | ICD-10-CM | POA: Diagnosis not present

## 2015-04-05 DIAGNOSIS — D509 Iron deficiency anemia, unspecified: Secondary | ICD-10-CM | POA: Diagnosis not present

## 2015-04-05 DIAGNOSIS — E1129 Type 2 diabetes mellitus with other diabetic kidney complication: Secondary | ICD-10-CM | POA: Diagnosis not present

## 2015-04-05 DIAGNOSIS — D631 Anemia in chronic kidney disease: Secondary | ICD-10-CM | POA: Diagnosis not present

## 2015-04-05 DIAGNOSIS — N2581 Secondary hyperparathyroidism of renal origin: Secondary | ICD-10-CM | POA: Diagnosis not present

## 2015-04-07 DIAGNOSIS — N186 End stage renal disease: Secondary | ICD-10-CM | POA: Diagnosis not present

## 2015-04-07 DIAGNOSIS — E1129 Type 2 diabetes mellitus with other diabetic kidney complication: Secondary | ICD-10-CM | POA: Diagnosis not present

## 2015-04-07 DIAGNOSIS — N2581 Secondary hyperparathyroidism of renal origin: Secondary | ICD-10-CM | POA: Diagnosis not present

## 2015-04-07 DIAGNOSIS — D631 Anemia in chronic kidney disease: Secondary | ICD-10-CM | POA: Diagnosis not present

## 2015-04-07 DIAGNOSIS — D509 Iron deficiency anemia, unspecified: Secondary | ICD-10-CM | POA: Diagnosis not present

## 2015-04-08 DIAGNOSIS — Z992 Dependence on renal dialysis: Secondary | ICD-10-CM | POA: Diagnosis not present

## 2015-04-08 DIAGNOSIS — N186 End stage renal disease: Secondary | ICD-10-CM | POA: Diagnosis not present

## 2015-04-08 DIAGNOSIS — E1129 Type 2 diabetes mellitus with other diabetic kidney complication: Secondary | ICD-10-CM | POA: Diagnosis not present

## 2015-04-09 DIAGNOSIS — N186 End stage renal disease: Secondary | ICD-10-CM | POA: Diagnosis not present

## 2015-04-09 DIAGNOSIS — D631 Anemia in chronic kidney disease: Secondary | ICD-10-CM | POA: Diagnosis not present

## 2015-04-09 DIAGNOSIS — N2581 Secondary hyperparathyroidism of renal origin: Secondary | ICD-10-CM | POA: Diagnosis not present

## 2015-04-09 DIAGNOSIS — D509 Iron deficiency anemia, unspecified: Secondary | ICD-10-CM | POA: Diagnosis not present

## 2015-04-09 DIAGNOSIS — E1129 Type 2 diabetes mellitus with other diabetic kidney complication: Secondary | ICD-10-CM | POA: Diagnosis not present

## 2015-04-13 DIAGNOSIS — N186 End stage renal disease: Secondary | ICD-10-CM | POA: Diagnosis not present

## 2015-04-13 DIAGNOSIS — I871 Compression of vein: Secondary | ICD-10-CM | POA: Diagnosis not present

## 2015-04-13 DIAGNOSIS — T82858D Stenosis of vascular prosthetic devices, implants and grafts, subsequent encounter: Secondary | ICD-10-CM | POA: Diagnosis not present

## 2015-04-13 DIAGNOSIS — Z992 Dependence on renal dialysis: Secondary | ICD-10-CM | POA: Diagnosis not present

## 2015-04-18 ENCOUNTER — Other Ambulatory Visit: Payer: Self-pay | Admitting: Cardiology

## 2015-04-18 NOTE — Telephone Encounter (Signed)
REFILL 

## 2015-04-21 DIAGNOSIS — N2581 Secondary hyperparathyroidism of renal origin: Secondary | ICD-10-CM | POA: Diagnosis not present

## 2015-04-21 DIAGNOSIS — N186 End stage renal disease: Secondary | ICD-10-CM | POA: Diagnosis not present

## 2015-04-23 DIAGNOSIS — N2581 Secondary hyperparathyroidism of renal origin: Secondary | ICD-10-CM | POA: Diagnosis not present

## 2015-04-23 DIAGNOSIS — N186 End stage renal disease: Secondary | ICD-10-CM | POA: Diagnosis not present

## 2015-05-05 DIAGNOSIS — E1129 Type 2 diabetes mellitus with other diabetic kidney complication: Secondary | ICD-10-CM | POA: Diagnosis not present

## 2015-05-08 DIAGNOSIS — N186 End stage renal disease: Secondary | ICD-10-CM | POA: Diagnosis not present

## 2015-05-08 DIAGNOSIS — Z992 Dependence on renal dialysis: Secondary | ICD-10-CM | POA: Diagnosis not present

## 2015-05-08 DIAGNOSIS — E1129 Type 2 diabetes mellitus with other diabetic kidney complication: Secondary | ICD-10-CM | POA: Diagnosis not present

## 2015-05-10 DIAGNOSIS — E1129 Type 2 diabetes mellitus with other diabetic kidney complication: Secondary | ICD-10-CM | POA: Diagnosis not present

## 2015-05-10 DIAGNOSIS — N186 End stage renal disease: Secondary | ICD-10-CM | POA: Diagnosis not present

## 2015-05-10 DIAGNOSIS — D631 Anemia in chronic kidney disease: Secondary | ICD-10-CM | POA: Diagnosis not present

## 2015-05-10 DIAGNOSIS — N2581 Secondary hyperparathyroidism of renal origin: Secondary | ICD-10-CM | POA: Diagnosis not present

## 2015-05-12 DIAGNOSIS — N186 End stage renal disease: Secondary | ICD-10-CM | POA: Diagnosis not present

## 2015-05-12 DIAGNOSIS — D631 Anemia in chronic kidney disease: Secondary | ICD-10-CM | POA: Diagnosis not present

## 2015-05-12 DIAGNOSIS — N2581 Secondary hyperparathyroidism of renal origin: Secondary | ICD-10-CM | POA: Diagnosis not present

## 2015-05-12 DIAGNOSIS — E1129 Type 2 diabetes mellitus with other diabetic kidney complication: Secondary | ICD-10-CM | POA: Diagnosis not present

## 2015-05-14 DIAGNOSIS — E1129 Type 2 diabetes mellitus with other diabetic kidney complication: Secondary | ICD-10-CM | POA: Diagnosis not present

## 2015-05-14 DIAGNOSIS — N2581 Secondary hyperparathyroidism of renal origin: Secondary | ICD-10-CM | POA: Diagnosis not present

## 2015-05-14 DIAGNOSIS — D631 Anemia in chronic kidney disease: Secondary | ICD-10-CM | POA: Diagnosis not present

## 2015-05-14 DIAGNOSIS — N186 End stage renal disease: Secondary | ICD-10-CM | POA: Diagnosis not present

## 2015-05-16 ENCOUNTER — Other Ambulatory Visit: Payer: Self-pay | Admitting: Family Medicine

## 2015-05-16 NOTE — Telephone Encounter (Signed)
Medication filled to pharmacy as requested.   

## 2015-05-17 DIAGNOSIS — N2581 Secondary hyperparathyroidism of renal origin: Secondary | ICD-10-CM | POA: Diagnosis not present

## 2015-05-17 DIAGNOSIS — E1129 Type 2 diabetes mellitus with other diabetic kidney complication: Secondary | ICD-10-CM | POA: Diagnosis not present

## 2015-05-17 DIAGNOSIS — D631 Anemia in chronic kidney disease: Secondary | ICD-10-CM | POA: Diagnosis not present

## 2015-05-17 DIAGNOSIS — N186 End stage renal disease: Secondary | ICD-10-CM | POA: Diagnosis not present

## 2015-05-18 ENCOUNTER — Encounter: Payer: Self-pay | Admitting: Family Medicine

## 2015-05-18 MED ORDER — LEVOTHYROXINE SODIUM 125 MCG PO TABS: ORAL_TABLET | ORAL | Status: DC

## 2015-05-18 NOTE — Telephone Encounter (Signed)
Medication filled to pharmacy as requested.   

## 2015-05-19 DIAGNOSIS — D631 Anemia in chronic kidney disease: Secondary | ICD-10-CM | POA: Diagnosis not present

## 2015-05-19 DIAGNOSIS — N2581 Secondary hyperparathyroidism of renal origin: Secondary | ICD-10-CM | POA: Diagnosis not present

## 2015-05-19 DIAGNOSIS — E1129 Type 2 diabetes mellitus with other diabetic kidney complication: Secondary | ICD-10-CM | POA: Diagnosis not present

## 2015-05-19 DIAGNOSIS — N186 End stage renal disease: Secondary | ICD-10-CM | POA: Diagnosis not present

## 2015-05-21 DIAGNOSIS — N186 End stage renal disease: Secondary | ICD-10-CM | POA: Diagnosis not present

## 2015-05-21 DIAGNOSIS — N2581 Secondary hyperparathyroidism of renal origin: Secondary | ICD-10-CM | POA: Diagnosis not present

## 2015-05-23 ENCOUNTER — Other Ambulatory Visit: Payer: Self-pay | Admitting: Cardiology

## 2015-05-23 NOTE — Telephone Encounter (Signed)
Rx request sent to pharmacy.  

## 2015-05-24 DIAGNOSIS — N186 End stage renal disease: Secondary | ICD-10-CM | POA: Diagnosis not present

## 2015-05-24 DIAGNOSIS — E1129 Type 2 diabetes mellitus with other diabetic kidney complication: Secondary | ICD-10-CM | POA: Diagnosis not present

## 2015-05-24 DIAGNOSIS — N2581 Secondary hyperparathyroidism of renal origin: Secondary | ICD-10-CM | POA: Diagnosis not present

## 2015-05-24 DIAGNOSIS — D631 Anemia in chronic kidney disease: Secondary | ICD-10-CM | POA: Diagnosis not present

## 2015-05-26 DIAGNOSIS — N186 End stage renal disease: Secondary | ICD-10-CM | POA: Diagnosis not present

## 2015-05-26 DIAGNOSIS — N2581 Secondary hyperparathyroidism of renal origin: Secondary | ICD-10-CM | POA: Diagnosis not present

## 2015-05-26 DIAGNOSIS — E1129 Type 2 diabetes mellitus with other diabetic kidney complication: Secondary | ICD-10-CM | POA: Diagnosis not present

## 2015-05-26 DIAGNOSIS — D631 Anemia in chronic kidney disease: Secondary | ICD-10-CM | POA: Diagnosis not present

## 2015-05-28 DIAGNOSIS — N2581 Secondary hyperparathyroidism of renal origin: Secondary | ICD-10-CM | POA: Diagnosis not present

## 2015-05-28 DIAGNOSIS — N186 End stage renal disease: Secondary | ICD-10-CM | POA: Diagnosis not present

## 2015-05-28 DIAGNOSIS — D631 Anemia in chronic kidney disease: Secondary | ICD-10-CM | POA: Diagnosis not present

## 2015-05-28 DIAGNOSIS — E1129 Type 2 diabetes mellitus with other diabetic kidney complication: Secondary | ICD-10-CM | POA: Diagnosis not present

## 2015-05-31 DIAGNOSIS — D631 Anemia in chronic kidney disease: Secondary | ICD-10-CM | POA: Diagnosis not present

## 2015-05-31 DIAGNOSIS — N2581 Secondary hyperparathyroidism of renal origin: Secondary | ICD-10-CM | POA: Diagnosis not present

## 2015-05-31 DIAGNOSIS — E1129 Type 2 diabetes mellitus with other diabetic kidney complication: Secondary | ICD-10-CM | POA: Diagnosis not present

## 2015-05-31 DIAGNOSIS — N186 End stage renal disease: Secondary | ICD-10-CM | POA: Diagnosis not present

## 2015-06-02 DIAGNOSIS — N2581 Secondary hyperparathyroidism of renal origin: Secondary | ICD-10-CM | POA: Diagnosis not present

## 2015-06-02 DIAGNOSIS — N186 End stage renal disease: Secondary | ICD-10-CM | POA: Diagnosis not present

## 2015-06-02 DIAGNOSIS — E1129 Type 2 diabetes mellitus with other diabetic kidney complication: Secondary | ICD-10-CM | POA: Diagnosis not present

## 2015-06-02 DIAGNOSIS — D631 Anemia in chronic kidney disease: Secondary | ICD-10-CM | POA: Diagnosis not present

## 2015-06-04 DIAGNOSIS — D631 Anemia in chronic kidney disease: Secondary | ICD-10-CM | POA: Diagnosis not present

## 2015-06-04 DIAGNOSIS — N2581 Secondary hyperparathyroidism of renal origin: Secondary | ICD-10-CM | POA: Diagnosis not present

## 2015-06-04 DIAGNOSIS — E1129 Type 2 diabetes mellitus with other diabetic kidney complication: Secondary | ICD-10-CM | POA: Diagnosis not present

## 2015-06-04 DIAGNOSIS — N186 End stage renal disease: Secondary | ICD-10-CM | POA: Diagnosis not present

## 2015-06-07 DIAGNOSIS — D631 Anemia in chronic kidney disease: Secondary | ICD-10-CM | POA: Diagnosis not present

## 2015-06-07 DIAGNOSIS — N2581 Secondary hyperparathyroidism of renal origin: Secondary | ICD-10-CM | POA: Diagnosis not present

## 2015-06-07 DIAGNOSIS — E1129 Type 2 diabetes mellitus with other diabetic kidney complication: Secondary | ICD-10-CM | POA: Diagnosis not present

## 2015-06-07 DIAGNOSIS — N186 End stage renal disease: Secondary | ICD-10-CM | POA: Diagnosis not present

## 2015-06-08 DIAGNOSIS — E1129 Type 2 diabetes mellitus with other diabetic kidney complication: Secondary | ICD-10-CM | POA: Diagnosis not present

## 2015-06-08 DIAGNOSIS — N186 End stage renal disease: Secondary | ICD-10-CM | POA: Diagnosis not present

## 2015-06-08 DIAGNOSIS — Z992 Dependence on renal dialysis: Secondary | ICD-10-CM | POA: Diagnosis not present

## 2015-06-09 DIAGNOSIS — D631 Anemia in chronic kidney disease: Secondary | ICD-10-CM | POA: Diagnosis not present

## 2015-06-09 DIAGNOSIS — E1129 Type 2 diabetes mellitus with other diabetic kidney complication: Secondary | ICD-10-CM | POA: Diagnosis not present

## 2015-06-09 DIAGNOSIS — N186 End stage renal disease: Secondary | ICD-10-CM | POA: Diagnosis not present

## 2015-06-09 DIAGNOSIS — N2581 Secondary hyperparathyroidism of renal origin: Secondary | ICD-10-CM | POA: Diagnosis not present

## 2015-06-10 NOTE — Progress Notes (Signed)
PCP: Annye Asa, MD  Clinic Note: Chief Complaint  Patient presents with  . Follow-up    edema; ankles and feet    HPI: [name] [name] is a 75 y.o. male with a PMH below who presents today for six-month follow-up. of CAD- CABG & recurrent PCI of mRCA ISR. PCI on the RCA is quite difficult, requires use of GuideLiner catheter - precludes ability to use Rotational Atherectomy. Has had re-do CABG & based upon referral back to Dr. Prescott Gum - not a re-redo candidate. Last cardiac catheterization/PTCA to the RCA was 08/02/2014.  [name] [name] was last seen on 01/28/2015 - was starting to note having to use nitroglycerin, but not as much. Noted low blood pressures making hemodialysis difficult. He denied any chest pain however with hemodialysis. NO CP with HD - just anxious with it. Started on Klonopin.  He has not been very active, but has been more active than usual.  No PND, orthopnea or edema -- HD removes ~6L   Recent Hospitalizations: None recently  Studies Reviewed: None  Interval History: [name] [name] presents today for routine follow-up, and has a smile on his face. He actually only notes that he has a little swelling in his ankles and feet because of this the day before dialysis. He says that these may be taking 2 or 370 nitroglycerin in the course of a week, but nothing like he usually does. He says his dry weight is probably gradually decreasing at dialysis. With this, he really hasn't had much in the way of any PND, orthopnea, but still has edema on the off days.  She still has stable angina, but has had very minimal episodes that have occurred without some type of exacerbating factor. No resting angina.  No palpitations, lightheadedness, dizziness, weakness or syncope/near syncope. No TIA/amaurosis fugax symptoms. No melena, hematochezia, hematuria, or epstaxis. No claudication.  He still is a very active, but is now trying to do a little bit more. He [place]ach for the weekend and very excited about it. 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  ROS: A comprehensive was performed. Review of Systems  Constitutional: Positive for malaise/fatigue (Less so than previously).  HENT: Negative for nosebleeds.   Respiratory: Negative for cough, shortness of breath and wheezing.   Cardiovascular:       Per history of present illness  Gastrointestinal: Negative for diarrhea and constipation.  Musculoskeletal: Positive for joint pain (Mild arthritis. Hips hurt when walking.). Negative for falls.  Neurological: Positive for dizziness (After dialysis). Negative for focal weakness and headaches.  Endo/Heme/Allergies: Bruises/bleeds easily.  Psychiatric/Behavioral: Negative for memory loss. The patient is nervous/anxious. The patient does not have insomnia.        Overall his mood and affect improved.  All other systems reviewed and are negative.   Past Medical History  Diagnosis Date  . Diabetes mellitus   . Hyperlipidemia   . Hypertension   . Gout   . Hypothyroidism     On supplementation  . LBBB (left bundle branch block)     Chronic  . Anemia     Likely secondary to history of GI bleed  . Stroke ([place]) 1998  . CAD in native artery; and the grafts 1992, 1997, 2002, 2006, 2012    CABG 1992 and redo CABG 2006  . CAD (coronary artery disease) of bypass graft 2006, 2012    In 2006: Occluded SVG-OM noted (  SVG-diagonal was previously occluded); 2012: Severe lesion in redo SVG-OM1 --> BMS PCI   . CAD S/P percutaneous coronary angioplasty October 2012; Feb, Apr & Oct 2015; Feb 2016    a) s/p CABG 1992 and redo 2006. b) 10/12: NSTEMI: PCI to SVG-OM1 Integrity BMS 3.5 x 15 (3.85 mm) c) 2/15: UA - PCI mid RCA Xience Ap DES 2.75 x 15 (3.1 mm). d) 4/15 : UA - focal dRCA Resolute DES 2.25 x 8 (2.5 mm). e) 10/15: PCI to mRCA ISR w/ post stent lesion - Promus P 2.75 x 16 (3.25 mm). f) 2/16: NSTEMI: CBA/PTCA of  mRCA  ISR (3.5 mm post-dilation); g) 7/16 ISR RCA->CBA/PTCA, residual 20%.  . Ischemic cardiomyopathy     a) EF 45-50% by echo 2015. b) EF 50% by cath 04/2014.  Marland Kitchen History of Non-ST elevated myocardial infarction (non-STEMI) 1992, 2006, 2012; 02/2013  . Peptic ulcer disease  2004  . BPH (benign prostatic hyperplasia)   . Chronic low back pain   . Carotid arterial disease ([place])     a) Duplex 05/2012: R BULB/PROX ICA: 50-69%, L BULB/PROX ICA: 0-49%. ;; 04/29/2014: [id number] RICA, [id number] LICA. Patetn vertebrals,  . Colon cancer [facility]) 2003    colectomy for CA. no recurrence . never required  radiation or chem  . End stage renal disease on dialysis [facility])     Dialyzes at [facility]: Tues/Th/Sat - left upper cavity AV fistula brachiocephalic  . Plavix resistance     a) P2Y12 264 in 02/2014 - put on Brilinta but did not tolerate due to SOB. b) cannot be on Effient due to history of stroke. c) Plavix increased to 75mg  BID and Pletal added 04/2014 due to ISR.    Past Surgical History  Procedure Laterality Date  . Appendectomy    . Cholecystectomy  2009  . Cataract extraction w/ intraocular lens  implant, bilateral Bilateral   . Posterior lumbar fusion    . Av fistula repair Left 05/2009  . Colon resection  2003    transverse and proximal descending w/ primar anastomosis  . Doppler echocardiography  01/25/2012    EF 50 to 55%  . Nm myocar perf wall motion  10/12/2011    EF 54%,LV normal ; no signifiant ischemia  . Cardiac catheterization  10/16/2010    patent  LIMA to the LAD , PATENT  svg TO 2nd marginals ,occluded PLA with collaterals and SVG to the OM  had 90% stenosis  was txlge  bare - metal  3.5  Integrity ten  postdilate  36-37  mm    . Cardiac catheterization  03/22/2004    loss of both SVG,severe disease in prox and ostial  circ not amenable to invention. mod diease distal LAD  AFTER BYPASS GRAFT;-CVTS to evaluate   . Coronary angioplasty  04/03/1995    OM and Circ  . Coronary angioplasty   02/01/2000    CIRC  . Carotid doppler  05/23/2012    ABN CAROTID-- RGT BULB/PROX ICA mild to mod 50-60%;lft bulb/prox mild to mod 0-49%;left subclavian abn waveforms consistent with patients lft arm A/V fistula  . Event monitor  01/23/2012-02/06/2012    SINUS ,LBBB,unifocal PVCs  . Percutaneous coronary stent intervention (pci-s)  02/23/2013    mid RCA 80% & 70% - Xience 2.75 mm x 15 mm ( 3.70mm) ; LIMA-LAD patent, SVG-OM patent (stent patent); SVG-Diag patent.  . Transthoracic echocardiogram  02/23/2013    Moderate concentric hypertrophy. EF 4500%. Septal bounce. Grade 2 diastolic  dysfunction (pseudo-normal - severely elevated filling pressures) mild to moderate MR and moderate LA dilation to  . Percutaneous coronary stent intervention (pci-s)  April 2015    dRCA - Integrity Resolute DES   2.25 mm x 13mm (2.5 mm)  . Percutaneous coronary stent intervention (pci-s)  Oct 15 2013    [place] Angina: mRCA ISR with post-stent stenosis -- Cuting PTCA & PCI Promus Premier DES 2.75 mm x 16 mm (3.25 mm)  . Lower extremity arterial dopplers  October 2014    Calcified but non-occlusive peripheral arteries. No evidence of stenosis  . Left heart catheterization with coronary angiogram N/A 02/23/2013    Procedure: LEFT HEART CATHETERIZATION WITH CORONARY ANGIOGRAM;  Surgeon: Lorretta Harp, MD;  Location: [facility] CATH LAB;  Service: Cardiovascular;  Laterality: N/A;  . Left heart catheterization with coronary/graft angiogram N/A 04/30/2013    Procedure: LEFT HEART CATHETERIZATION WITH Beatrix Fetters;  Surgeon: Leonie Man, MD;  Location: [facility] CATH LAB;  Service: Cardiovascular;  Laterality: N/A;  . Left heart catheterization with coronary/graft angiogram N/A 10/15/2013    Procedure: LEFT HEART CATHETERIZATION WITH Beatrix Fetters;  Surgeon: Leonie Man, MD;  Location: [facility] C[facility] LAB;  Service: Cardiovascular;  Laterality: N/A;  . Left heart catheterization with coronary/graft angiogram N/A  02/09/2014    Procedure: LEFT HEART CATHETERIZATION WITH Beatrix Fetters;  Surgeon: Leonie Man, MD;  Location: [facility] CATH LAB;  Service: Cardiovascular;  Laterality: N/A;  . Coronary angioplasty  02/09/14    Cutting Balloon PTCA of mRCA ISR 99% - 3.5 mm  . Colon surgery  2003    "cancer"  . Coronary artery bypass graft  08/22/1990    INITIAL:LIMA to LAD, SVG to  Diagonal, SVG to OM  . Coronary artery bypass graft  2006    SVG to OM1,SVG to OM2 with patent LIMA  to LAD and occluded vein  graft to the diagonal  and occluded vein grft to OM from  prior  surgery  . Back surgery    . Left and right heart catheterization with coronary/graft angiogram N/A 04/20/2014    Procedure: LEFT AND RIGHT HEART CATHETERIZATION WITH Beatrix Fetters;  Surgeon: Sherren Mocha, MD;  Location: [facility] CATH LAB;  Service: Cardiovascular;  Laterality: N/A;  . Cardiac catheterization N/A 08/03/2014    Procedure: Left Heart Cath and Cors/Grafts Angiography;  Surgeon: Leonie Man, MD;  Location: [place] CV LAB;  Service: Cardiovascular;  Laterality: N/A;  . Cardiac catheterization N/A 08/03/2014    Procedure: Coronary Balloon Angioplasty;  Surgeon: Leonie Man, MD;  Location: [place] CV LAB;  Service: Cardiovascular;  Laterality: N/A;    Prior to Admission medications   Medication Sig Start Date End Date Taking? Authorizing Provider  aspirin 81 MG chewable tablet Chew 1 tablet (81 mg total) by mouth daily. Patient taking differently: Chew 81 mg by mouth at bedtime.  04/21/14  Yes Dayna N Dunn, PA-C  atorvastatin (LIPITOR) 40 MG tablet Take 1 tablet (40 mg total) by mouth at bedtime. 07/21/14  Yes Midge Minium, MD  Besifloxacin HCl (BESIVANCE) 0.6 % SUSP Place 1 drop into both eyes See admin instructions. Use eye drops 4 times daily for 2 days following injection by Dr. Zigmund Daniel (every 10 weeks)   Yes Historical Provider, MD  carvedilol (COREG) 25 MG tablet TAKE ONE TABLET BY MOUTH TWICE DAILY  WITH MEALS 04/04/15  Yes Midge Minium, MD  cilostazol (PLETAL) 100 MG tablet TAKE ONE TABLET BY MOUTH TWICE DAILY  05/23/15  Yes Leonie Man, MD  clonazePAM (KLONOPIN) 0.5 MG tablet TAKE ONE-HALF TABLET BY MOUTH DAILY AS NEEDED FOR ANXIETY 02/01/15  Yes Midge Minium, MD  clopidogrel (PLAVIX) 75 MG tablet Take 1 tablet (75 mg total) by mouth 2 (two) times daily. Patient taking differently: Take 75 mg by mouth daily.  04/21/14  Yes Dayna N Dunn, PA-C  glucose blood (TRUE METRIX BLOOD GLUCOSE TEST) test strip 1 each by Other route as needed for other. Use as instructed to test sugars 2-3 times daily. Dx. [id number] 11/05/14  Yes Midge Minium, MD  insulin NPH Human (HUMULIN N,NOVOLIN N) 100 UNIT/ML injection Inject 10-20 Units into the skin 2 (two) times daily as needed (CBG >130). CBG 130-150 inject 10 units, >150 inject 20 units   Yes Historical Provider, MD  isosorbide mononitrate (IMDUR) 120 MG 24 hr tablet Take 1 tablet (120 mg total) by mouth daily. 05/13/14  Yes Leonie Man, MD  levothyroxine (SYNTHROID, LEVOTHROID) 125 MCG tablet TAKE ONE TABLET BY MOUTH ONCE DAILY BEFORE BREAKFAST 05/18/15  Yes Midge Minium, MD  lidocaine-prilocaine (EMLA) cream Apply 1 application topically See admin instructions. Apply prior to dialysis treatments on Tuesday, Thursday and Saturday   Yes Historical Provider, MD  loperamide (IMODIUM A-D) 2 MG tablet Take 4 mg by mouth 2 (two) times daily as needed for diarrhea or loose stools.   Yes Historical Provider, MD  nitroGLYCERIN (NITROSTAT) 0.4 MG SL tablet Place 1 tablet (0.4 mg total) under the tongue every 5 (five) minutes as needed for chest pain. 11/18/13  Yes Midge Minium, MD  NITROSTAT 0.4 MG SL tablet Place 1 tablet (0.4 mg total) under the tongue every 5 (five) minutes as needed for chest pain. Max 3 doses. 03/28/15  Yes Leonie Man, MD  sevelamer carbonate (RENVELA) 800 MG tablet Take 1,600-2,400 mg by mouth See admin instructions.  Take 3 tablets (2400 mg) with meals and 2 tablets (1600 mg) with snacks   Yes Historical Provider, MD     Allergies  Allergen Reactions  . Brilinta [Ticagrelor] Shortness Of Breath  . Shellfish Allergy Other (See Comments)    Causes gout flare-ups   Social History   Social History  . Marital Status: Married    Spouse Name: N/A  . Number of Children: 3  . Years of Education: N/A   Social History Main Topics  . Smoking status: Never Smoker   . Smokeless tobacco: Never Used  . Alcohol Use: No  . Drug Use: No  . Sexual Activity: No   Other Topics Concern  . None   Social History Narrative   Long-term patient of Dr. Rollene Fare.   Married father of 63, grandfather 55.   Never smoked.   Not exercising D2 hip and back pain & now Angina    family history includes Diabetes in his father and mother; Heart attack in his father and mother; Heart disease in his father and mother; Hypertension in his father and mother; Stroke in his paternal aunt and paternal uncle.   Wt Readings from Last 3 Encounters:  06/17/15 153 lb 3.2 oz (69.491 kg)  03/09/15 151 lb 4 oz (68.607 kg)  01/26/15 153 lb (69.4 kg)    PHYSICAL EXAM BP 123/63 mmHg  Pulse 71  Ht 5\' 4"  (1.626 m)  Wt 153 lb 3.2 oz (69.491 kg)  BMI 26.28 kg/m2 General appearance: alert, cooperative, appears stated age, no distress; and Borderline obese. Chronically ill-appearing, pale, well groomed.Marland Kitchen  answers questions appropriately.  Neck: no adenopathy, no carotid bruit, mild JVD; Supple, symmetrical, trachea midline  Lungs: CTAB, normal percussion bilaterally and Nonlabored, good air movement  Heart: normal apical impulse, RRR, S1, S2 normal, S4 present, SEM 2/6, crescendo, decrescendo and harsh at 2nd right intercostal space, no click and no rub  Abdomen: soft, non-tender; bowel sounds normal; no masses, no organomegaly and Mild truncal obesity  Extremities: extremities normal, atraumatic, no cyanosis or edema and fistula in  the left upper arm with good thrill  Pulses: 2+ and symmetric  Neurologic: Grossly normal Pscyh;More up-beat mood & affect   Adult ECG Report Not checked  Other studies Reviewed: Additional studies/ records that were reviewed today include:  Recent Labs:      ASSESSMENT / PLAN: Problem List Items Addressed This Visit    Hyperlipidemia with target LDL less than 70 (Chronic)    Monitored by PCP. On statin.      Relevant Medications   isosorbide mononitrate (IMDUR) 120 MG 24 hr tablet   Essential hypertension (Chronic)    Not really sure. Tends to drop during hemodialysis. Would not further titrate medications at this time.      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 Hopefully this will provide him more sustained benefit.      Coronary artery disease involving native coronary artery of native heart with angina pectoris (HCC) - Primary (Chronic)    Extensive existing coronary disease, but the most symptomatic area has been the extensively stented RCA. We have reduced his Plavix down to once daily. He is on high-dose Imdur as he has been intolerant of Ranexa. He is on stable dose of carvedilol as well as atorvastatin.  No room to titrate up any antihypertensives because of dizziness post dialysis.      Relevant Medications   isosorbide mononitrate (IMDUR) 120 MG 24 hr tablet   CAD S/P percutaneous coronary angioplasty (Chronic)    Multiple episodes of angioplasty to his RCA stented segment. He is evidencing restenosis in the mid RCA requiring PTCA several times, but is now 10 months out from the last episode. I think if that area causes a problem again we made have to consider restenting.      Relevant Medications   isosorbide mononitrate (IMDUR) 120 MG 24 hr tablet   Angina, class II - stable (Chronic)    Continues to  do well now almost 10 months out from his last intervention. We are holding out hope that this will continue. No change to his regimen.      Relevant Medications   isosorbide mononitrate (IMDUR) 120 MG 24 hr tablet      Current medicines are reviewed at length with the patient today. (+/- concerns) None The following changes have been made: None. Okay to stop aspirin Refill sublingual nitroglycerin   Studies Ordered:   No orders of the defined types were placed in this encounter.   ROV 3- 4 MONTHS WITH DR HARDING.   [name], M.D., M.S. Interventional Cardiologist   Pager # [phone] Phone # [phone] [address] [place], [place] [address]

## 2015-06-16 ENCOUNTER — Emergency Department (HOSPITAL_COMMUNITY)
Admission: EM | Admit: 2015-06-16 | Discharge: 2015-06-16 | Disposition: A | Discharge status: Home / Self Care | Payer: Medicare Other | Attending: Emergency Medicine | Admitting: Emergency Medicine

## 2015-06-16 ENCOUNTER — Encounter (HOSPITAL_COMMUNITY): Payer: Self-pay

## 2015-06-16 DIAGNOSIS — Z7982 Long term (current) use of aspirin: Secondary | ICD-10-CM | POA: Insufficient documentation

## 2015-06-16 DIAGNOSIS — E119 Type 2 diabetes mellitus without complications: Secondary | ICD-10-CM | POA: Insufficient documentation

## 2015-06-16 DIAGNOSIS — N186 End stage renal disease: Secondary | ICD-10-CM | POA: Diagnosis not present

## 2015-06-16 DIAGNOSIS — Z951 Presence of aortocoronary bypass graft: Secondary | ICD-10-CM | POA: Diagnosis not present

## 2015-06-16 DIAGNOSIS — I120 Hypertensive chronic kidney disease with stage 5 chronic kidney disease or end stage renal disease: Secondary | ICD-10-CM | POA: Diagnosis not present

## 2015-06-16 DIAGNOSIS — T82838A Hemorrhage of vascular prosthetic devices, implants and grafts, initial encounter: Secondary | ICD-10-CM | POA: Insufficient documentation

## 2015-06-16 DIAGNOSIS — I2510 Atherosclerotic heart disease of native coronary artery without angina pectoris: Secondary | ICD-10-CM | POA: Insufficient documentation

## 2015-06-16 DIAGNOSIS — Z79899 Other long term (current) drug therapy: Secondary | ICD-10-CM | POA: Diagnosis not present

## 2015-06-16 DIAGNOSIS — I252 Old myocardial infarction: Secondary | ICD-10-CM | POA: Diagnosis not present

## 2015-06-16 DIAGNOSIS — Z794 Long term (current) use of insulin: Secondary | ICD-10-CM | POA: Diagnosis not present

## 2015-06-16 DIAGNOSIS — Z85038 Personal history of other malignant neoplasm of large intestine: Secondary | ICD-10-CM | POA: Insufficient documentation

## 2015-06-16 DIAGNOSIS — T82898A Other specified complication of vascular prosthetic devices, implants and grafts, initial encounter: Secondary | ICD-10-CM | POA: Diagnosis not present

## 2015-06-16 DIAGNOSIS — Y812 Prosthetic and other implants, materials and accessory general- and plastic-surgery devices associated with adverse incidents: Secondary | ICD-10-CM | POA: Diagnosis not present

## 2015-06-16 DIAGNOSIS — E785 Hyperlipidemia, unspecified: Secondary | ICD-10-CM | POA: Insufficient documentation

## 2015-06-16 DIAGNOSIS — Z8673 Personal history of transient ischemic attack (TIA), and cerebral infarction without residual deficits: Secondary | ICD-10-CM | POA: Insufficient documentation

## 2015-06-16 DIAGNOSIS — R58 Hemorrhage, not elsewhere classified: Secondary | ICD-10-CM | POA: Diagnosis not present

## 2015-06-16 DIAGNOSIS — S41109A Unspecified open wound of unspecified upper arm, initial encounter: Secondary | ICD-10-CM | POA: Diagnosis not present

## 2015-06-16 MED ORDER — CLONIDINE HCL 0.2 MG PO TABS: 0.2000 mg | ORAL_TABLET | Freq: Once | ORAL | Status: DC

## 2015-06-16 NOTE — ED Provider Notes (Signed)
CSN: [id number]     Arrival date & time 06/16/15  1155 History   First MD Initiated Contact with Patient 06/16/15 1229     Chief Complaint  Patient presents with  . Vascular Access Problem    bleeding fistula     (Consider location/radiation/quality/duration/timing/severity/associated sxs/prior Treatment) HPI Comments: Patient is a 75 year old male with past medical history of end-stage renal disease on hemodialysis. He is also taking Plavix. He presents for evaluation of bleeding from his dialysis fistula. He had his dialysis performed today and when walking out of this session, he began having significant bleeding. The dialysis workers were having a difficult time controlling the bleeding and placed a clamp and sent him here. He has no other complaints.  The history is provided by the patient.    Past Medical History  Diagnosis Date  . Diabetes mellitus   . Hyperlipidemia   . Hypertension   . Gout   . Hypothyroidism     On supplementation  . LBBB (left bundle branch block)     Chronic  . Anemia     Likely secondary to history of GI bleed  . Stroke ([place]) 1998  . CAD in native artery; and the grafts 1992, 1997, 2002, 2006, 2012    CABG 1992 and redo CABG 2006  . CAD (coronary artery disease) of bypass graft 2006, 2012    In 2006: Occluded SVG-OM noted (SVG-diagonal was previously occluded); 2012: Severe lesion in redo SVG-OM1 --> BMS PCI   . CAD S/P percutaneous coronary angioplasty October 2012; Feb, Apr & Oct 2015; Feb 2016    a) s/p CABG 1992 and redo 2006. b) 10/12: NSTEMI: PCI to SVG-OM1 Integrity BMS 3.5 x 15 (3.85 mm) c) 2/15: UA - PCI mid RCA Xience Ap DES 2.75 x 15 (3.1 mm). d) 4/15 : UA - focal dRCA Resolute DES 2.25 x 8 (2.5 mm). e) 10/15: PCI to mRCA ISR w/ post stent lesion - Promus P 2.75 x 16 (3.25 mm). f) 2/16: NSTEMI: CBA/PTCA of  mRCA ISR (3.5 mm post-dilation); g) 7/16 ISR RCA->CBA/PTCA, residual 20%.  . Ischemic cardiomyopathy     a) EF 45-50% by echo 2015. b)  EF 50% by cath 04/2014.  Marland Kitchen History of Non-ST elevated myocardial infarction (non-STEMI) 1992, 2006, 2012; 02/2013  . Peptic ulcer disease  2004  . BPH (benign prostatic hyperplasia)   . Chronic low back pain   . Carotid arterial disease ([place])     a) Duplex 05/2012: R BULB/PROX ICA: 50-69%, L BULB/PROX ICA: 0-49%. ;; 04/29/2014: [id number] RICA, [id number] LICA. Patetn vertebrals,  . Colon cancer [facility]) 2003    colectomy for CA. no recurrence . never required  radiation or chem  . End stage renal disease on dialysis [facility])     Dialyzes at [facility]: Tues/Th/Sat - left upper cavity AV fistula brachiocephalic  . Plavix resistance     a) P2Y12 264 in 02/2014 - put on Brilinta but did not tolerate due to SOB. b) cannot be on Effient due to history of stroke. c) Plavix increased to 75mg  BID and Pletal added 04/2014 due to ISR.   Past Surgical History  Procedure Laterality Date  . Appendectomy    . Cholecystectomy  2009  . Cataract extraction w/ intraocular lens  implant, bilateral Bilateral   . Posterior lumbar fusion    . Av fistula repair Left 05/2009  . Colon resection  2003    transverse and proximal descending w/ primar anastomosis  . Doppler  echocardiography  01/25/2012    EF 50 to 55%  . Nm myocar perf wall motion  10/12/2011    EF 54%,LV normal ; no signifiant ischemia  . Cardiac catheterization  10/16/2010    patent  LIMA to the LAD , PATENT  svg TO 2nd marginals ,occluded PLA with collaterals and SVG to the OM  had 90% stenosis  was txlge  bare - metal  3.5  Integrity ten  postdilate  36-37  mm    . Cardiac catheterization  03/22/2004    loss of both SVG,severe disease in prox and ostial  circ not amenable to invention. mod diease distal LAD  AFTER BYPASS GRAFT;-CVTS to evaluate   . Coronary angioplasty  04/03/1995    OM and Circ  . Coronary angioplasty  02/01/2000    [facility]  . Carotid doppler  05/23/2012    ABN CAROTID-- RGT BULB/PROX ICA mild to mod 50-60%;lft bulb/prox mild to mod  0-49%;left subclavian abn waveforms consistent with patients lft arm A/V fistula  . Event monitor  01/23/2012-02/06/2012    SINUS ,LBBB,unifocal PVCs  . Percutaneous coronary stent intervention (pci-s)  02/23/2013    mid RCA 80% & 70% - Xience 2.75 mm x 15 mm ( 3.17mm) ; LIMA-LAD patent, SVG-OM patent (stent patent); SVG-Diag patent.  . Transthoracic echocardiogram  02/23/2013    Moderate concentric hypertrophy. EF 4500%. Septal bounce. Grade 2 diastolic dysfunction (pseudo-normal - severely elevated filling pressures) mild to moderate MR and moderate LA dilation to  . Percutaneous coronary stent intervention (pci-s)  April 2015    dRCA - Integrity Resolute DES   2.25 mm x 59mm (2.5 mm)  . Percutaneous coronary stent intervention (pci-s)  Oct 15 2013    [place] Angina: mRCA ISR with post-stent stenosis -- Cuting PTCA & PCI Promus Premier DES 2.75 mm x 16 mm (3.25 mm)  . Lower extremity arterial dopplers  October 2014    Calcified but non-occlusive peripheral arteries. No evidence of stenosis  . Left heart catheterization with coronary angiogram N/A 02/23/2013    Procedure: LEFT HEART CATHETERIZATION WITH CORONARY ANGIOGRAM;  Surgeon: Lorretta Harp, MD;  Location: [facility] CATH LAB;  Service: Cardiovascular;  Laterality: N/A;  . Left heart catheterization with coronary/graft angiogram N/A 04/30/2013    Procedure: LEFT HEART CATHETERIZATION WITH Beatrix Fetters;  Surgeon: Leonie Man, MD;  Location: [facility] CATH LAB;  Service: Cardiovascular;  Laterality: N/A;  . Left heart catheterization with coronary/graft angiogram N/A 10/15/2013    Procedure: LEFT HEART CATHETERIZATION WITH Beatrix Fetters;  Surgeon: Leonie Man, MD;  Location: [facility] CATH LAB;  Service: Cardiovascular;  Laterality: N/A;  . Left heart catheterization with coronary/graft angiogram N/A 02/09/2014    Procedure: LEFT HEART CATHETERIZATION WITH Beatrix Fetters;  Surgeon: Leonie Man, MD;  Location: [facility] CATH LAB;   Service: Cardiovascular;  Laterality: N/A;  . Coronary angioplasty  02/09/14    [place] PTCA of mRCA ISR 99% - 3.5 mm  . Colon surgery  2003    "cancer"  . Coronary artery bypass graft  08/22/1990    INITIAL:LIMA to LAD, SVG to  Diagonal, SVG to OM  . Coronary artery bypass graft  2006    SVG to OM1,SVG to OM2 with patent LIMA  to LAD and occluded vein  graft to the diagonal  and occluded vein grft to OM from  prior  surgery  . Back surgery    . Left and right heart catheterization with coronary/graft angiogram N/A 04/20/2014  Procedure: LEFT AND RIGHT HEART CATHETERIZATION WITH Beatrix Fetters;  Surgeon: Sherren Mocha, MD;  Location: [facility] CATH LAB;  Service: Cardiovascular;  Laterality: N/A;  . Cardiac catheterization N/A 08/03/2014    Procedure: Left Heart Cath and Cors/Grafts Angiography;  Surgeon: Leonie Man, MD;  Location: [place] CV LAB;  Service: Cardiovascular;  Laterality: N/A;  . Cardiac catheterization N/A 08/03/2014    Procedure: Coronary Balloon Angioplasty;  Surgeon: Leonie Man, MD;  Location: [place] CV LAB;  Service: Cardiovascular;  Laterality: N/A;   Family History  Problem Relation Age of Onset  . Heart disease Mother   . Hypertension Mother   . Diabetes Mother   . Heart disease Father   . Hypertension Father   . Diabetes Father   . Heart attack Mother   . Heart attack Father   . Stroke Paternal Aunt   . Stroke Paternal Uncle    Social History  Substance Use Topics  . Smoking status: Never Smoker   . Smokeless tobacco: Never Used  . Alcohol Use: No    Review of Systems  All other systems reviewed and are negative.     Allergies  Brilinta and Shellfish allergy  Home Medications   Prior to Admission medications   Medication Sig Start Date End Date Taking? Authorizing Provider  aspirin 81 MG chewable tablet Chew 1 tablet (81 mg total) by mouth daily. Patient taking differently: Chew 81 mg by mouth at bedtime.  04/21/14    Dayna N Dunn, PA-C  atorvastatin (LIPITOR) 40 MG tablet Take 1 tablet (40 mg total) by mouth at bedtime. 07/21/14   Midge Minium, MD  Besifloxacin HCl (BESIVANCE) 0.6 % SUSP Place 1 drop into both eyes See admin instructions. Use eye drops 4 times daily for 2 days following injection by Dr. Zigmund Daniel (every 10 weeks)    Historical Provider, MD  carvedilol (COREG) 25 MG tablet TAKE ONE TABLET BY MOUTH TWICE DAILY WITH MEALS 04/04/15   Midge Minium, MD  cilostazol (PLETAL) 100 MG tablet TAKE ONE TABLET BY MOUTH TWICE DAILY 05/23/15   Leonie Man, MD  clonazePAM (KLONOPIN) 0.5 MG tablet TAKE ONE-HALF TABLET BY MOUTH DAILY AS NEEDED FOR ANXIETY 02/01/15   Midge Minium, MD  clopidogrel (PLAVIX) 75 MG tablet Take 1 tablet (75 mg total) by mouth 2 (two) times daily. Patient taking differently: Take 75 mg by mouth daily.  04/21/14   Dayna N Dunn, PA-C  clopidogrel (PLAVIX) 75 MG tablet Take 1 tablet (75 mg total) by mouth daily. KEEP OV. 04/18/15   Leonie Man, MD  glucose blood (TRUE METRIX BLOOD GLUCOSE TEST) test strip 1 each by Other route as needed for other. Use as instructed to test sugars 2-3 times daily. Dx. E11.40 11/05/14   Midge Minium, MD  insulin NPH Human (HUMULIN N,NOVOLIN N) 100 UNIT/ML injection Inject 10-20 Units into the skin 2 (two) times daily as needed (CBG >130). CBG 130-150 inject 10 units, >150 inject 20 units    Historical Provider, MD  isosorbide mononitrate (IMDUR) 120 MG 24 hr tablet Take 1 tablet (120 mg total) by mouth daily. 05/13/14   Leonie Man, MD  levothyroxine (SYNTHROID, LEVOTHROID) 125 MCG tablet TAKE ONE TABLET BY MOUTH ONCE DAILY BEFORE BREAKFAST 05/18/15   Midge Minium, MD  lidocaine-prilocaine (EMLA) cream Apply 1 application topically See admin instructions. Apply prior to dialysis treatments on Tuesday, Thursday and Saturday    Historical Provider, MD  loperamide (IMODIUM  A-D) 2 MG tablet Take 4 mg by mouth 2 (two) times daily as  needed for diarrhea or loose stools.    Historical Provider, MD  nitroGLYCERIN (NITROSTAT) 0.4 MG SL tablet Place 1 tablet (0.4 mg total) under the tongue every 5 (five) minutes as needed for chest pain. 11/18/13   Midge Minium, MD  NITROSTAT 0.4 MG SL tablet Place 1 tablet (0.4 mg total) under the tongue every 5 (five) minutes as needed for chest pain. Max 3 doses. 03/28/15   Leonie Man, MD  sevelamer carbonate (RENVELA) 800 MG tablet Take 1,600-2,400 mg by mouth See admin instructions. Take 3 tablets (2400 mg) with meals and 2 tablets (1600 mg) with snacks    Historical Provider, MD   SpO2 100% Physical Exam  Constitutional: He is oriented to person, place, and time. He appears well-developed and well-nourished. No distress.  HENT:  Head: Normocephalic and atraumatic.  Neck: Normal range of motion. Neck supple.  Musculoskeletal:  The left upper extremity is noted have a dialysis graft in his left bicep. The clamp and dressing is in place. This was removed and the puncture sites were no longer actively bleeding. A padded 4 x 4 dressing was applied along with an Ace bandage.  Distal capillary refill is brisk. Ulnar and radial pulses are palpable. The arm appears to be well perfused.  Neurological: He is alert and oriented to person, place, and time.  Skin: Skin is warm and dry. He is not diaphoretic.  Nursing note and vitals reviewed.   ED Course  Procedures (including critical care time) Labs Review Labs Reviewed - No data to display  Imaging Review No results found. I have personally reviewed and evaluated these images and lab results as part of my medical decision-making.    MDM   Final diagnoses:  None    Patient presents with bleeding from a dialysis graft in his left bicep area. A clamp was in place when he arrived and there has been no further bleeding. The clamp and underlying dressing were removed and a bulky dressing consisting of 4 x 4's and an Ace bandage  was applied. He has had no further bleeding while in the ER. I discussed this with Dr. Bridgett Larsson as the family expressed some concern that maybe there was a problem. He feels as though the patient is appropriate for discharge. Patient is a follow-up with his regular dialysis. He experiences further problems, he should follow-up with Dr. Kellie Simmering who performed the graft insertion.    Veryl Speak, MD 06/16/15 [phone]

## 2015-06-16 NOTE — ED Notes (Signed)
Pt. Coming from dialysis today via [facility] for bleeding fistula. Pt. Completed his tx today and his RN was unable to stop the bleeding from his access site. Bleeding was controlled by the time EMS arrived with a clamp.The clamp remains on his arm. Pt. Has no complaints at this time.

## 2015-06-16 NOTE — Discharge Instructions (Signed)
Leave the dressing in place for the next 12-24 hours.  Continue dialysis as before.  Return to the emergency department if you experience further bleeding that is not controlled with direct pressure.

## 2015-06-16 NOTE — ED Notes (Signed)
EDP at bedside  

## 2015-06-17 ENCOUNTER — Encounter: Payer: Self-pay | Admitting: Cardiology

## 2015-06-17 ENCOUNTER — Encounter (INDEPENDENT_AMBULATORY_CARE_PROVIDER_SITE_OTHER): Payer: Medicare Other | Admitting: Ophthalmology

## 2015-06-17 ENCOUNTER — Ambulatory Visit: Payer: Medicare Other | Admitting: Cardiology

## 2015-06-17 ENCOUNTER — Ambulatory Visit (INDEPENDENT_AMBULATORY_CARE_PROVIDER_SITE_OTHER): Payer: Medicare Other | Admitting: Cardiology

## 2015-06-17 VITALS — BP 123/63 | HR 71 | Ht 64.0 in | Wt 153.2 lb

## 2015-06-17 DIAGNOSIS — I209 Angina pectoris, unspecified: Secondary | ICD-10-CM | POA: Diagnosis not present

## 2015-06-17 DIAGNOSIS — H43813 Vitreous degeneration, bilateral: Secondary | ICD-10-CM | POA: Diagnosis not present

## 2015-06-17 DIAGNOSIS — I10 Essential (primary) hypertension: Secondary | ICD-10-CM

## 2015-06-17 DIAGNOSIS — I2510 Atherosclerotic heart disease of native coronary artery without angina pectoris: Secondary | ICD-10-CM | POA: Diagnosis not present

## 2015-06-17 DIAGNOSIS — T82857D Stenosis of cardiac prosthetic devices, implants and grafts, subsequent encounter: Secondary | ICD-10-CM

## 2015-06-17 DIAGNOSIS — E113391 Type 2 diabetes mellitus with moderate nonproliferative diabetic retinopathy without macular edema, right eye: Secondary | ICD-10-CM | POA: Diagnosis not present

## 2015-06-17 DIAGNOSIS — E785 Hyperlipidemia, unspecified: Secondary | ICD-10-CM | POA: Diagnosis not present

## 2015-06-17 DIAGNOSIS — T82855D Stenosis of coronary artery stent, subsequent encounter: Secondary | ICD-10-CM

## 2015-06-17 DIAGNOSIS — H35033 Hypertensive retinopathy, bilateral: Secondary | ICD-10-CM | POA: Diagnosis not present

## 2015-06-17 DIAGNOSIS — E113312 Type 2 diabetes mellitus with moderate nonproliferative diabetic retinopathy with macular edema, left eye: Secondary | ICD-10-CM

## 2015-06-17 DIAGNOSIS — I25119 Atherosclerotic heart disease of native coronary artery with unspecified angina pectoris: Secondary | ICD-10-CM | POA: Diagnosis not present

## 2015-06-17 DIAGNOSIS — E11311 Type 2 diabetes mellitus with unspecified diabetic retinopathy with macular edema: Secondary | ICD-10-CM | POA: Diagnosis not present

## 2015-06-17 DIAGNOSIS — Z9861 Coronary angioplasty status: Secondary | ICD-10-CM

## 2015-06-17 MED ORDER — ISOSORBIDE MONONITRATE ER 120 MG PO TB24: 120.0000 mg | ORAL_TABLET | Freq: Every day | ORAL | Status: DC

## 2015-06-17 MED ORDER — CLOPIDOGREL BISULFATE 75 MG PO TABS: 75.0000 mg | ORAL_TABLET | Freq: Every day | ORAL | Status: DC

## 2015-06-17 NOTE — Patient Instructions (Signed)
STOP ASPIRIN    CONTINUE WITH ALL  OTHER CURRENT MEDICATIONS   Your physician recommends that you schedule a follow-up appointment in 3- [place].  If you need a refill on your cardiac medications before your next appointment, please call your pharmacy.

## 2015-06-20 NOTE — Assessment & Plan Note (Signed)
Continues to do well now almost 10 months out from his last intervention. We are holding out hope that this will continue. No change to his regimen.

## 2015-06-20 NOTE — Assessment & Plan Note (Signed)
Extensive existing coronary disease, but the most symptomatic area has been the extensively stented RCA. We have reduced his Plavix down to once daily. He is on high-dose Imdur as he has been intolerant of Ranexa. He is on stable dose of carvedilol as well as atorvastatin.  No room to titrate up any antihypertensives because of dizziness post dialysis.

## 2015-06-20 NOTE — Assessment & Plan Note (Signed)
He has done very well following this most recent angioplasty. Hopefully this will provide him more sustained benefit.

## 2015-06-20 NOTE — Assessment & Plan Note (Signed)
Multiple episodes of angioplasty to his RCA stented segment. He is evidencing restenosis in the mid RCA requiring PTCA several times, but is now 10 months out from the last episode. I think if that area causes a problem again we made have to consider restenting.

## 2015-06-20 NOTE — Assessment & Plan Note (Signed)
Monitored by PCP. On statin.

## 2015-06-20 NOTE — Assessment & Plan Note (Signed)
Not really sure. Tends to drop during hemodialysis. Would not further titrate medications at this time.

## 2015-06-25 DIAGNOSIS — N186 End stage renal disease: Secondary | ICD-10-CM | POA: Diagnosis not present

## 2015-06-27 DIAGNOSIS — I871 Compression of vein: Secondary | ICD-10-CM | POA: Diagnosis not present

## 2015-06-27 DIAGNOSIS — N186 End stage renal disease: Secondary | ICD-10-CM | POA: Diagnosis not present

## 2015-06-27 DIAGNOSIS — Z992 Dependence on renal dialysis: Secondary | ICD-10-CM | POA: Diagnosis not present

## 2015-06-27 DIAGNOSIS — T82868D Thrombosis of vascular prosthetic devices, implants and grafts, subsequent encounter: Secondary | ICD-10-CM | POA: Diagnosis not present

## 2015-06-29 DIAGNOSIS — L570 Actinic keratosis: Secondary | ICD-10-CM | POA: Diagnosis not present

## 2015-06-29 DIAGNOSIS — D225 Melanocytic nevi of trunk: Secondary | ICD-10-CM | POA: Diagnosis not present

## 2015-06-29 DIAGNOSIS — D2272 Melanocytic nevi of left lower limb, including hip: Secondary | ICD-10-CM | POA: Diagnosis not present

## 2015-06-29 DIAGNOSIS — L639 Alopecia areata, unspecified: Secondary | ICD-10-CM | POA: Diagnosis not present

## 2015-06-29 DIAGNOSIS — Z808 Family history of malignant neoplasm of other organs or systems: Secondary | ICD-10-CM | POA: Diagnosis not present

## 2015-06-29 DIAGNOSIS — D485 Neoplasm of uncertain behavior of skin: Secondary | ICD-10-CM | POA: Diagnosis not present

## 2015-06-29 DIAGNOSIS — C44311 Basal cell carcinoma of skin of nose: Secondary | ICD-10-CM | POA: Diagnosis not present

## 2015-07-08 DIAGNOSIS — N186 End stage renal disease: Secondary | ICD-10-CM | POA: Diagnosis not present

## 2015-07-08 DIAGNOSIS — Z992 Dependence on renal dialysis: Secondary | ICD-10-CM | POA: Diagnosis not present

## 2015-07-08 DIAGNOSIS — E1129 Type 2 diabetes mellitus with other diabetic kidney complication: Secondary | ICD-10-CM | POA: Diagnosis not present

## 2015-07-09 DIAGNOSIS — N186 End stage renal disease: Secondary | ICD-10-CM | POA: Diagnosis not present

## 2015-07-09 DIAGNOSIS — N2581 Secondary hyperparathyroidism of renal origin: Secondary | ICD-10-CM | POA: Diagnosis not present

## 2015-07-11 DIAGNOSIS — N186 End stage renal disease: Secondary | ICD-10-CM | POA: Diagnosis not present

## 2015-07-11 DIAGNOSIS — N2581 Secondary hyperparathyroidism of renal origin: Secondary | ICD-10-CM | POA: Diagnosis not present

## 2015-07-13 DIAGNOSIS — N2581 Secondary hyperparathyroidism of renal origin: Secondary | ICD-10-CM | POA: Diagnosis not present

## 2015-07-13 DIAGNOSIS — N186 End stage renal disease: Secondary | ICD-10-CM | POA: Diagnosis not present

## 2015-07-16 DIAGNOSIS — N186 End stage renal disease: Secondary | ICD-10-CM | POA: Diagnosis not present

## 2015-07-16 DIAGNOSIS — N2581 Secondary hyperparathyroidism of renal origin: Secondary | ICD-10-CM | POA: Diagnosis not present

## 2015-07-16 DIAGNOSIS — E1129 Type 2 diabetes mellitus with other diabetic kidney complication: Secondary | ICD-10-CM | POA: Diagnosis not present

## 2015-07-16 DIAGNOSIS — E876 Hypokalemia: Secondary | ICD-10-CM | POA: Diagnosis not present

## 2015-07-16 DIAGNOSIS — D631 Anemia in chronic kidney disease: Secondary | ICD-10-CM | POA: Diagnosis not present

## 2015-07-18 ENCOUNTER — Other Ambulatory Visit: Payer: Self-pay | Admitting: Cardiology

## 2015-07-18 NOTE — Telephone Encounter (Signed)
atorvastatin (LIPITOR) 40 MG tablet  Medication   Date: 07/21/2014  Department: [place] [facility]  Ordering/Authorizing: Midge Minium, MD      Order Providers    Prescribing Provider Encounter Provider   Midge Minium, MD Midge Minium, MD    Medication Detail      Disp Refills Start End     atorvastatin (LIPITOR) 40 MG tablet 90 tablet 1 07/21/2014     Sig - Route: Take 1 tablet (40 mg total) by mouth at bedtime. - Oral    E-Prescribing Status: Receipt confirmed by pharmacy (07/21/2014 9:41 AM EDT)     Pharmacy    [facility] [place] ([address] [place] - [place]

## 2015-07-19 DIAGNOSIS — E1129 Type 2 diabetes mellitus with other diabetic kidney complication: Secondary | ICD-10-CM | POA: Diagnosis not present

## 2015-07-19 DIAGNOSIS — D631 Anemia in chronic kidney disease: Secondary | ICD-10-CM | POA: Diagnosis not present

## 2015-07-19 DIAGNOSIS — N186 End stage renal disease: Secondary | ICD-10-CM | POA: Diagnosis not present

## 2015-07-19 DIAGNOSIS — N2581 Secondary hyperparathyroidism of renal origin: Secondary | ICD-10-CM | POA: Diagnosis not present

## 2015-07-19 DIAGNOSIS — E876 Hypokalemia: Secondary | ICD-10-CM | POA: Diagnosis not present

## 2015-07-20 ENCOUNTER — Other Ambulatory Visit: Payer: Self-pay | Admitting: Cardiology

## 2015-07-21 DIAGNOSIS — E1129 Type 2 diabetes mellitus with other diabetic kidney complication: Secondary | ICD-10-CM | POA: Diagnosis not present

## 2015-07-21 DIAGNOSIS — N2581 Secondary hyperparathyroidism of renal origin: Secondary | ICD-10-CM | POA: Diagnosis not present

## 2015-07-21 DIAGNOSIS — E876 Hypokalemia: Secondary | ICD-10-CM | POA: Diagnosis not present

## 2015-07-21 DIAGNOSIS — N186 End stage renal disease: Secondary | ICD-10-CM | POA: Diagnosis not present

## 2015-07-21 DIAGNOSIS — D631 Anemia in chronic kidney disease: Secondary | ICD-10-CM | POA: Diagnosis not present

## 2015-07-23 DIAGNOSIS — N2581 Secondary hyperparathyroidism of renal origin: Secondary | ICD-10-CM | POA: Diagnosis not present

## 2015-07-23 DIAGNOSIS — E1129 Type 2 diabetes mellitus with other diabetic kidney complication: Secondary | ICD-10-CM | POA: Diagnosis not present

## 2015-07-23 DIAGNOSIS — N186 End stage renal disease: Secondary | ICD-10-CM | POA: Diagnosis not present

## 2015-07-23 DIAGNOSIS — E876 Hypokalemia: Secondary | ICD-10-CM | POA: Diagnosis not present

## 2015-07-23 DIAGNOSIS — D631 Anemia in chronic kidney disease: Secondary | ICD-10-CM | POA: Diagnosis not present

## 2015-07-26 DIAGNOSIS — E876 Hypokalemia: Secondary | ICD-10-CM | POA: Diagnosis not present

## 2015-07-26 DIAGNOSIS — N2581 Secondary hyperparathyroidism of renal origin: Secondary | ICD-10-CM | POA: Diagnosis not present

## 2015-07-26 DIAGNOSIS — D631 Anemia in chronic kidney disease: Secondary | ICD-10-CM | POA: Diagnosis not present

## 2015-07-26 DIAGNOSIS — E1129 Type 2 diabetes mellitus with other diabetic kidney complication: Secondary | ICD-10-CM | POA: Diagnosis not present

## 2015-07-26 DIAGNOSIS — N186 End stage renal disease: Secondary | ICD-10-CM | POA: Diagnosis not present

## 2015-07-28 ENCOUNTER — Encounter: Payer: Medicare Other | Admitting: Family Medicine

## 2015-07-28 DIAGNOSIS — N2581 Secondary hyperparathyroidism of renal origin: Secondary | ICD-10-CM | POA: Diagnosis not present

## 2015-07-28 DIAGNOSIS — N186 End stage renal disease: Secondary | ICD-10-CM | POA: Diagnosis not present

## 2015-07-28 DIAGNOSIS — E1129 Type 2 diabetes mellitus with other diabetic kidney complication: Secondary | ICD-10-CM | POA: Diagnosis not present

## 2015-07-28 DIAGNOSIS — E876 Hypokalemia: Secondary | ICD-10-CM | POA: Diagnosis not present

## 2015-07-28 DIAGNOSIS — D631 Anemia in chronic kidney disease: Secondary | ICD-10-CM | POA: Diagnosis not present

## 2015-07-30 DIAGNOSIS — N2581 Secondary hyperparathyroidism of renal origin: Secondary | ICD-10-CM | POA: Diagnosis not present

## 2015-07-30 DIAGNOSIS — N186 End stage renal disease: Secondary | ICD-10-CM | POA: Diagnosis not present

## 2015-07-30 DIAGNOSIS — E1129 Type 2 diabetes mellitus with other diabetic kidney complication: Secondary | ICD-10-CM | POA: Diagnosis not present

## 2015-07-30 DIAGNOSIS — D631 Anemia in chronic kidney disease: Secondary | ICD-10-CM | POA: Diagnosis not present

## 2015-07-30 DIAGNOSIS — E876 Hypokalemia: Secondary | ICD-10-CM | POA: Diagnosis not present

## 2015-08-02 DIAGNOSIS — E1129 Type 2 diabetes mellitus with other diabetic kidney complication: Secondary | ICD-10-CM | POA: Diagnosis not present

## 2015-08-02 DIAGNOSIS — E876 Hypokalemia: Secondary | ICD-10-CM | POA: Diagnosis not present

## 2015-08-02 DIAGNOSIS — N186 End stage renal disease: Secondary | ICD-10-CM | POA: Diagnosis not present

## 2015-08-02 DIAGNOSIS — D631 Anemia in chronic kidney disease: Secondary | ICD-10-CM | POA: Diagnosis not present

## 2015-08-02 DIAGNOSIS — N2581 Secondary hyperparathyroidism of renal origin: Secondary | ICD-10-CM | POA: Diagnosis not present

## 2015-08-04 DIAGNOSIS — E1129 Type 2 diabetes mellitus with other diabetic kidney complication: Secondary | ICD-10-CM | POA: Diagnosis not present

## 2015-08-04 DIAGNOSIS — N186 End stage renal disease: Secondary | ICD-10-CM | POA: Diagnosis not present

## 2015-08-04 DIAGNOSIS — N2581 Secondary hyperparathyroidism of renal origin: Secondary | ICD-10-CM | POA: Diagnosis not present

## 2015-08-04 DIAGNOSIS — D631 Anemia in chronic kidney disease: Secondary | ICD-10-CM | POA: Diagnosis not present

## 2015-08-04 DIAGNOSIS — E876 Hypokalemia: Secondary | ICD-10-CM | POA: Diagnosis not present

## 2015-08-06 DIAGNOSIS — E876 Hypokalemia: Secondary | ICD-10-CM | POA: Diagnosis not present

## 2015-08-06 DIAGNOSIS — N2581 Secondary hyperparathyroidism of renal origin: Secondary | ICD-10-CM | POA: Diagnosis not present

## 2015-08-06 DIAGNOSIS — N186 End stage renal disease: Secondary | ICD-10-CM | POA: Diagnosis not present

## 2015-08-06 DIAGNOSIS — D631 Anemia in chronic kidney disease: Secondary | ICD-10-CM | POA: Diagnosis not present

## 2015-08-06 DIAGNOSIS — E1129 Type 2 diabetes mellitus with other diabetic kidney complication: Secondary | ICD-10-CM | POA: Diagnosis not present

## 2015-08-08 DIAGNOSIS — Z992 Dependence on renal dialysis: Secondary | ICD-10-CM | POA: Diagnosis not present

## 2015-08-08 DIAGNOSIS — E1129 Type 2 diabetes mellitus with other diabetic kidney complication: Secondary | ICD-10-CM | POA: Diagnosis not present

## 2015-08-08 DIAGNOSIS — N186 End stage renal disease: Secondary | ICD-10-CM | POA: Diagnosis not present

## 2015-08-09 ENCOUNTER — Encounter: Payer: Medicare Other | Admitting: Family Medicine

## 2015-08-09 DIAGNOSIS — N186 End stage renal disease: Secondary | ICD-10-CM | POA: Diagnosis not present

## 2015-08-09 DIAGNOSIS — D631 Anemia in chronic kidney disease: Secondary | ICD-10-CM | POA: Diagnosis not present

## 2015-08-09 DIAGNOSIS — E1129 Type 2 diabetes mellitus with other diabetic kidney complication: Secondary | ICD-10-CM | POA: Diagnosis not present

## 2015-08-09 DIAGNOSIS — N2581 Secondary hyperparathyroidism of renal origin: Secondary | ICD-10-CM | POA: Diagnosis not present

## 2015-08-09 DIAGNOSIS — E876 Hypokalemia: Secondary | ICD-10-CM | POA: Diagnosis not present

## 2015-08-10 ENCOUNTER — Ambulatory Visit (INDEPENDENT_AMBULATORY_CARE_PROVIDER_SITE_OTHER): Payer: Medicare Other | Admitting: Family Medicine

## 2015-08-10 ENCOUNTER — Encounter: Payer: Self-pay | Admitting: Family Medicine

## 2015-08-10 VITALS — BP 122/68 | HR 69 | Temp 97.9°F | Resp 17 | Ht 64.0 in | Wt 148.4 lb

## 2015-08-10 DIAGNOSIS — Z125 Encounter for screening for malignant neoplasm of prostate: Secondary | ICD-10-CM

## 2015-08-10 DIAGNOSIS — I25119 Atherosclerotic heart disease of native coronary artery with unspecified angina pectoris: Secondary | ICD-10-CM | POA: Diagnosis not present

## 2015-08-10 DIAGNOSIS — Z794 Long term (current) use of insulin: Secondary | ICD-10-CM

## 2015-08-10 DIAGNOSIS — E038 Other specified hypothyroidism: Secondary | ICD-10-CM | POA: Diagnosis not present

## 2015-08-10 DIAGNOSIS — I10 Essential (primary) hypertension: Secondary | ICD-10-CM | POA: Diagnosis not present

## 2015-08-10 DIAGNOSIS — I5042 Chronic combined systolic (congestive) and diastolic (congestive) heart failure: Secondary | ICD-10-CM

## 2015-08-10 DIAGNOSIS — I209 Angina pectoris, unspecified: Secondary | ICD-10-CM | POA: Diagnosis not present

## 2015-08-10 DIAGNOSIS — Z0000 Encounter for general adult medical examination without abnormal findings: Secondary | ICD-10-CM

## 2015-08-10 DIAGNOSIS — E0840 Diabetes mellitus due to underlying condition with diabetic neuropathy, unspecified: Secondary | ICD-10-CM | POA: Diagnosis not present

## 2015-08-10 DIAGNOSIS — N186 End stage renal disease: Secondary | ICD-10-CM | POA: Diagnosis not present

## 2015-08-10 DIAGNOSIS — E785 Hyperlipidemia, unspecified: Secondary | ICD-10-CM | POA: Diagnosis not present

## 2015-08-10 LAB — TSH: TSH: 0.51 u[IU]/mL (ref 0.35–4.50)

## 2015-08-10 LAB — HEPATIC FUNCTION PANEL
ALT: 10 U/L (ref 0–53)
AST: 13 U/L (ref 0–37)
Albumin: 4 g/dL (ref 3.5–5.2)
Alkaline Phosphatase: 92 U/L (ref 39–117)
BILIRUBIN DIRECT: 0.1 mg/dL (ref 0.0–0.3)
BILIRUBIN TOTAL: 0.5 mg/dL (ref 0.2–1.2)
TOTAL PROTEIN: 6.7 g/dL (ref 6.0–8.3)

## 2015-08-10 LAB — BASIC METABOLIC PANEL
BUN: 28 mg/dL — ABNORMAL HIGH (ref 6–23)
CALCIUM: 10.1 mg/dL (ref 8.4–10.5)
CHLORIDE: 99 meq/L (ref 96–112)
CO2: 30 meq/L (ref 19–32)
CREATININE: 4.78 mg/dL — AB (ref 0.40–1.50)
GFR: 12.76 mL/min — CL (ref >60.00)
GLUCOSE: 122 mg/dL — AB (ref 70–99)
Potassium: 4.3 mEq/L (ref 3.5–5.1)
Sodium: 139 mEq/L (ref 135–145)

## 2015-08-10 LAB — HEMOGLOBIN A1C: HEMOGLOBIN A1C: 5.3 % (ref 4.6–6.5)

## 2015-08-10 LAB — CBC WITH DIFFERENTIAL/PLATELET
BASOS ABS: 0.1 10*3/uL (ref 0.0–0.1)
BASOS PCT: 0.6 % (ref 0.0–3.0)
EOS ABS: 0.2 10*3/uL (ref 0.0–0.7)
Eosinophils Relative: 2.3 % (ref 0.0–5.0)
HCT: 34.5 % — ABNORMAL LOW (ref 39.0–52.0)
Hemoglobin: 11.5 g/dL — ABNORMAL LOW (ref 13.0–17.0)
Lymphocytes Relative: 6.5 % — ABNORMAL LOW (ref 12.0–46.0)
Lymphs Abs: 0.5 10*3/uL — ABNORMAL LOW (ref 0.7–4.0)
MCHC: 33.3 g/dL (ref 30.0–36.0)
MCV: 92.2 fl (ref 78.0–100.0)
MONO ABS: 0.6 10*3/uL (ref 0.1–1.0)
Monocytes Relative: 7.2 % (ref 3.0–12.0)
NEUTROS ABS: 7.1 10*3/uL (ref 1.4–7.7)
Neutrophils Relative %: 83.4 % — ABNORMAL HIGH (ref 43.0–77.0)
PLATELETS: 274 10*3/uL (ref 150.0–400.0)
RBC: 3.74 Mil/uL — ABNORMAL LOW (ref 4.22–5.81)
RDW: 16.2 % — AB (ref 11.5–15.5)
WBC: 8.5 10*3/uL (ref 4.0–10.5)

## 2015-08-10 LAB — LIPID PANEL
CHOLESTEROL: 107 mg/dL (ref 0–200)
HDL: 28.8 mg/dL — AB (ref >39.00)
LDL Cholesterol: 56 mg/dL (ref 0–99)
NONHDL: 78.39
Total CHOL/HDL Ratio: 4
Triglycerides: 113 mg/dL (ref 0.0–149.0)
VLDL: 22.6 mg/dL (ref 0.0–40.0)

## 2015-08-10 LAB — PSA, MEDICARE: PSA: 0.97 ng/mL (ref 0.10–4.00)

## 2015-08-10 NOTE — Progress Notes (Signed)
Subjective:    Patient ID: [name] [name], male    DOB: Jun 05, 1940, 75 y.o.   MRN: [id number]  HPI Here today for CPE.  Risk Factors: DM- chronic problem, home CBGs ranging 88-120s.  On SSI NPH.  UTD on eye exam, sees podiatry for foot exam- due for appt (pt plans to schedule).  Denies symptomatic lows.  + chronic neuropathy. HTN- chronic problem, w/ good control.  On Coreg.  Had to stop all other BP meds due to hypotension after HD.  Chronic CP, denies SOB, HAs, visual changes. Hyperlipidemia- chronic problem, on Lipitor.  Denies abd pain, N/V.  + chronic myalgias. Hypothyroid- chronic problem, on Levothyroxine 149mcg daily.  + fatigue.  Denies changes to skin, hair, nails. CAD- chronic problem, following w/ Dr Ellyn Hack.  On Coreg, Plavix, Imdur.  Continues to have CP but not as severe as previously. ESRD- chronic problem, on HD 3x/week.  'i just don't feel good most of the time'.  + fatigue, body aches. Physical Activity: limited due to cardiac issues Fall Risk: elevated Depression: denies Hearing: decreased hearing ADL's: independent Cognitive: normal linear thought process, memory and attention intact Home Safety: safe at home, lives w/ wife Height, Weight, BMI, Visual Acuity: see vitals, vision corrected to 20/20 w/ glasses Counseling: not able to have colonoscopy due to cardiac issues- open to idea of cologuard.  UTD on eye exam.  UTD on immunizations.  Due for foot exam- chronic neuropathy, pt plans to schedule appt. Care team reviewed and updated Labs Ordered: See A&P Care Plan: See A&P    Review of Systems Patient reports no vision/hearing changes, anorexia, fever ,adenopathy, persistant/recurrent hoarseness, swallowing issues, palpitations, edema, persistant/recurrent cough, hemoptysis, dyspnea (rest,exertional, paroxysmal nocturnal), gastrointestinal  bleeding (melena, rectal bleeding), abdominal pain, excessive heart burn, GU symptoms (dysuria, hematuria,  voiding/incontinence issues) syncope, focal weakness, memory loss, skin/hair/nail changes, depression, anxiety, abnormal bruising/bleeding.     Objective:   Physical Exam General Appearance:    Alert, cooperative, no distress, appears stated age  Head:    Normocephalic, without obvious abnormality, atraumatic  Eyes:    PERRL, conjunctiva/corneas clear, EOM's intact, fundi    benign, both eyes       Ears:    Normal TM's and external ear canals, both ears  Nose:   Nares normal, septum midline, mucosa normal, no drainage   or sinus tenderness  Throat:   Lips, mucosa, and tongue normal; teeth and gums normal  Neck:   Supple, symmetrical, trachea midline, no adenopathy;       thyroid:  No enlargement/tenderness/nodules  Back:     Symmetric, no curvature, ROM normal, no CVA tenderness  Lungs:     Clear to auscultation bilaterally, respirations unlabored  Chest wall:    No tenderness or deformity  Heart:    Regular rate and rhythm, S1 and S2 normal, III/VI blowing SEM over RUSB  Abdomen:     Soft, non-tender, bowel sounds active all four quadrants,    no masses, no organomegaly  Genitalia:    Deferred at pt's request  Rectal:    Extremities:   Extremities normal, atraumatic, no cyanosis or edema.  AV fistula in L upper arm  Pulses:   2+ and symmetric all extremities  Skin:   Skin color, texture, turgor normal, no rashes or lesions  Lymph nodes:   Cervical, supraclavicular, and axillary nodes normal  Neurologic:   CNII-XII intact. Normal strength          Assessment & Plan:

## 2015-08-10 NOTE — Patient Instructions (Signed)
Follow up in 3-4 months to recheck diabetes We'll notify you of your lab results and make any changes if needed Continue to work on healthy diet- you look great! Call and schedule your foot exam w/ Dr Fritzi Mandes Please complete the cologuard as directed You are up to date on your immunizations- yay! (but flu shot is coming!) Call with any questions or concerns Enjoy the rest of your summer!!!

## 2015-08-10 NOTE — Progress Notes (Signed)
Pre visit review using our clinic review tool, if applicable. No additional management support is needed unless otherwise documented below in the visit note. 

## 2015-08-11 DIAGNOSIS — N2581 Secondary hyperparathyroidism of renal origin: Secondary | ICD-10-CM | POA: Diagnosis not present

## 2015-08-11 DIAGNOSIS — E1129 Type 2 diabetes mellitus with other diabetic kidney complication: Secondary | ICD-10-CM | POA: Diagnosis not present

## 2015-08-11 DIAGNOSIS — E876 Hypokalemia: Secondary | ICD-10-CM | POA: Diagnosis not present

## 2015-08-11 DIAGNOSIS — D631 Anemia in chronic kidney disease: Secondary | ICD-10-CM | POA: Diagnosis not present

## 2015-08-11 DIAGNOSIS — N186 End stage renal disease: Secondary | ICD-10-CM | POA: Diagnosis not present

## 2015-08-13 DIAGNOSIS — D631 Anemia in chronic kidney disease: Secondary | ICD-10-CM | POA: Diagnosis not present

## 2015-08-13 DIAGNOSIS — N186 End stage renal disease: Secondary | ICD-10-CM | POA: Diagnosis not present

## 2015-08-13 DIAGNOSIS — E876 Hypokalemia: Secondary | ICD-10-CM | POA: Diagnosis not present

## 2015-08-13 DIAGNOSIS — E1129 Type 2 diabetes mellitus with other diabetic kidney complication: Secondary | ICD-10-CM | POA: Diagnosis not present

## 2015-08-13 DIAGNOSIS — N2581 Secondary hyperparathyroidism of renal origin: Secondary | ICD-10-CM | POA: Diagnosis not present

## 2015-08-15 DIAGNOSIS — Z1212 Encounter for screening for malignant neoplasm of rectum: Secondary | ICD-10-CM | POA: Diagnosis not present

## 2015-08-15 DIAGNOSIS — Z1211 Encounter for screening for malignant neoplasm of colon: Secondary | ICD-10-CM | POA: Diagnosis not present

## 2015-08-16 DIAGNOSIS — E876 Hypokalemia: Secondary | ICD-10-CM | POA: Diagnosis not present

## 2015-08-16 DIAGNOSIS — N186 End stage renal disease: Secondary | ICD-10-CM | POA: Diagnosis not present

## 2015-08-16 DIAGNOSIS — E1129 Type 2 diabetes mellitus with other diabetic kidney complication: Secondary | ICD-10-CM | POA: Diagnosis not present

## 2015-08-16 DIAGNOSIS — N2581 Secondary hyperparathyroidism of renal origin: Secondary | ICD-10-CM | POA: Diagnosis not present

## 2015-08-16 DIAGNOSIS — D631 Anemia in chronic kidney disease: Secondary | ICD-10-CM | POA: Diagnosis not present

## 2015-08-16 LAB — COLOGUARD: Cologuard: NEGATIVE

## 2015-08-16 NOTE — Assessment & Plan Note (Signed)
Chronic problem.  Tolerating statin w/o difficulty.  Check labs.  Adjust meds prn  

## 2015-08-16 NOTE — Assessment & Plan Note (Signed)
Chronic problem.  Adequate control.  Pt tends to deal w/ hypotension after HD txs.  No med changes at this time.  Will follow.

## 2015-08-16 NOTE — Assessment & Plan Note (Signed)
Chronic problem.  Following w/ cards.  Currently euvolemic- volume status is controlled by HD.  Will follow along and assist as able

## 2015-08-16 NOTE — Assessment & Plan Note (Signed)
Chronic problem.  Continues to have angina although not as severe as previously.  On statin, beta blocker, Plavix, and Imdur.  Following w/ cardiology regularly.  Will follow along.

## 2015-08-16 NOTE — Assessment & Plan Note (Signed)
Chronic problem.  On very little insulin at this time based on sliding scale.  UTD on eye exam, due for foot exam- he plans to schedule.  No need for microalbumin as pt is on HD.  Check labs.  Adjust meds prn

## 2015-08-16 NOTE — Assessment & Plan Note (Signed)
Chronic problem.  Fistula is working but since having stent placed he is having pain.  Encouraged him to discuss this w/ nephrology or vascular.  Pt expressed understanding and is in agreement w/ plan.

## 2015-08-16 NOTE — Assessment & Plan Note (Signed)
PE unchanged.  Not able to have colonoscopy due to cardiac risk.  UTD on immunizations.  Due for foot exam.  Written screening schedule updated and given to pt.  Check labs.  Anticipatory guidance provided.

## 2015-08-16 NOTE — Assessment & Plan Note (Signed)
Chronic problem.  Continues to struggle w/ fatigue but this could be due to HD.  Denies other thyroid sxs.  Check labs.  Adjust meds prn

## 2015-08-18 DIAGNOSIS — D631 Anemia in chronic kidney disease: Secondary | ICD-10-CM | POA: Diagnosis not present

## 2015-08-18 DIAGNOSIS — N186 End stage renal disease: Secondary | ICD-10-CM | POA: Diagnosis not present

## 2015-08-18 DIAGNOSIS — N2581 Secondary hyperparathyroidism of renal origin: Secondary | ICD-10-CM | POA: Diagnosis not present

## 2015-08-18 DIAGNOSIS — E876 Hypokalemia: Secondary | ICD-10-CM | POA: Diagnosis not present

## 2015-08-18 DIAGNOSIS — E1129 Type 2 diabetes mellitus with other diabetic kidney complication: Secondary | ICD-10-CM | POA: Diagnosis not present

## 2015-08-20 DIAGNOSIS — E876 Hypokalemia: Secondary | ICD-10-CM | POA: Diagnosis not present

## 2015-08-20 DIAGNOSIS — N186 End stage renal disease: Secondary | ICD-10-CM | POA: Diagnosis not present

## 2015-08-20 DIAGNOSIS — N2581 Secondary hyperparathyroidism of renal origin: Secondary | ICD-10-CM | POA: Diagnosis not present

## 2015-08-20 DIAGNOSIS — E1129 Type 2 diabetes mellitus with other diabetic kidney complication: Secondary | ICD-10-CM | POA: Diagnosis not present

## 2015-08-20 DIAGNOSIS — D631 Anemia in chronic kidney disease: Secondary | ICD-10-CM | POA: Diagnosis not present

## 2015-08-23 DIAGNOSIS — E876 Hypokalemia: Secondary | ICD-10-CM | POA: Diagnosis not present

## 2015-08-23 DIAGNOSIS — N186 End stage renal disease: Secondary | ICD-10-CM | POA: Diagnosis not present

## 2015-08-23 DIAGNOSIS — N2581 Secondary hyperparathyroidism of renal origin: Secondary | ICD-10-CM | POA: Diagnosis not present

## 2015-08-23 DIAGNOSIS — E1129 Type 2 diabetes mellitus with other diabetic kidney complication: Secondary | ICD-10-CM | POA: Diagnosis not present

## 2015-08-23 DIAGNOSIS — D631 Anemia in chronic kidney disease: Secondary | ICD-10-CM | POA: Diagnosis not present

## 2015-08-25 DIAGNOSIS — N2581 Secondary hyperparathyroidism of renal origin: Secondary | ICD-10-CM | POA: Diagnosis not present

## 2015-08-25 DIAGNOSIS — E876 Hypokalemia: Secondary | ICD-10-CM | POA: Diagnosis not present

## 2015-08-25 DIAGNOSIS — D631 Anemia in chronic kidney disease: Secondary | ICD-10-CM | POA: Diagnosis not present

## 2015-08-25 DIAGNOSIS — N186 End stage renal disease: Secondary | ICD-10-CM | POA: Diagnosis not present

## 2015-08-25 DIAGNOSIS — E1129 Type 2 diabetes mellitus with other diabetic kidney complication: Secondary | ICD-10-CM | POA: Diagnosis not present

## 2015-08-26 ENCOUNTER — Encounter: Payer: Self-pay | Admitting: General Practice

## 2015-08-27 DIAGNOSIS — N2581 Secondary hyperparathyroidism of renal origin: Secondary | ICD-10-CM | POA: Diagnosis not present

## 2015-08-27 DIAGNOSIS — D631 Anemia in chronic kidney disease: Secondary | ICD-10-CM | POA: Diagnosis not present

## 2015-08-27 DIAGNOSIS — E1129 Type 2 diabetes mellitus with other diabetic kidney complication: Secondary | ICD-10-CM | POA: Diagnosis not present

## 2015-08-27 DIAGNOSIS — N186 End stage renal disease: Secondary | ICD-10-CM | POA: Diagnosis not present

## 2015-08-27 DIAGNOSIS — E876 Hypokalemia: Secondary | ICD-10-CM | POA: Diagnosis not present

## 2015-08-30 DIAGNOSIS — E1129 Type 2 diabetes mellitus with other diabetic kidney complication: Secondary | ICD-10-CM | POA: Diagnosis not present

## 2015-08-30 DIAGNOSIS — N2581 Secondary hyperparathyroidism of renal origin: Secondary | ICD-10-CM | POA: Diagnosis not present

## 2015-08-30 DIAGNOSIS — E876 Hypokalemia: Secondary | ICD-10-CM | POA: Diagnosis not present

## 2015-08-30 DIAGNOSIS — N186 End stage renal disease: Secondary | ICD-10-CM | POA: Diagnosis not present

## 2015-08-30 DIAGNOSIS — D631 Anemia in chronic kidney disease: Secondary | ICD-10-CM | POA: Diagnosis not present

## 2015-09-01 DIAGNOSIS — E1129 Type 2 diabetes mellitus with other diabetic kidney complication: Secondary | ICD-10-CM | POA: Diagnosis not present

## 2015-09-01 DIAGNOSIS — N2581 Secondary hyperparathyroidism of renal origin: Secondary | ICD-10-CM | POA: Diagnosis not present

## 2015-09-01 DIAGNOSIS — E876 Hypokalemia: Secondary | ICD-10-CM | POA: Diagnosis not present

## 2015-09-01 DIAGNOSIS — N186 End stage renal disease: Secondary | ICD-10-CM | POA: Diagnosis not present

## 2015-09-01 DIAGNOSIS — D631 Anemia in chronic kidney disease: Secondary | ICD-10-CM | POA: Diagnosis not present

## 2015-09-03 DIAGNOSIS — N2581 Secondary hyperparathyroidism of renal origin: Secondary | ICD-10-CM | POA: Diagnosis not present

## 2015-09-03 DIAGNOSIS — D631 Anemia in chronic kidney disease: Secondary | ICD-10-CM | POA: Diagnosis not present

## 2015-09-03 DIAGNOSIS — E876 Hypokalemia: Secondary | ICD-10-CM | POA: Diagnosis not present

## 2015-09-03 DIAGNOSIS — N186 End stage renal disease: Secondary | ICD-10-CM | POA: Diagnosis not present

## 2015-09-03 DIAGNOSIS — E1129 Type 2 diabetes mellitus with other diabetic kidney complication: Secondary | ICD-10-CM | POA: Diagnosis not present

## 2015-09-06 DIAGNOSIS — E1129 Type 2 diabetes mellitus with other diabetic kidney complication: Secondary | ICD-10-CM | POA: Diagnosis not present

## 2015-09-06 DIAGNOSIS — N2581 Secondary hyperparathyroidism of renal origin: Secondary | ICD-10-CM | POA: Diagnosis not present

## 2015-09-06 DIAGNOSIS — E876 Hypokalemia: Secondary | ICD-10-CM | POA: Diagnosis not present

## 2015-09-06 DIAGNOSIS — D631 Anemia in chronic kidney disease: Secondary | ICD-10-CM | POA: Diagnosis not present

## 2015-09-06 DIAGNOSIS — N186 End stage renal disease: Secondary | ICD-10-CM | POA: Diagnosis not present

## 2015-09-08 DIAGNOSIS — E876 Hypokalemia: Secondary | ICD-10-CM | POA: Diagnosis not present

## 2015-09-08 DIAGNOSIS — N2581 Secondary hyperparathyroidism of renal origin: Secondary | ICD-10-CM | POA: Diagnosis not present

## 2015-09-08 DIAGNOSIS — Z992 Dependence on renal dialysis: Secondary | ICD-10-CM | POA: Diagnosis not present

## 2015-09-08 DIAGNOSIS — D631 Anemia in chronic kidney disease: Secondary | ICD-10-CM | POA: Diagnosis not present

## 2015-09-08 DIAGNOSIS — N186 End stage renal disease: Secondary | ICD-10-CM | POA: Diagnosis not present

## 2015-09-08 DIAGNOSIS — E1129 Type 2 diabetes mellitus with other diabetic kidney complication: Secondary | ICD-10-CM | POA: Diagnosis not present

## 2015-09-10 DIAGNOSIS — N186 End stage renal disease: Secondary | ICD-10-CM | POA: Diagnosis not present

## 2015-09-13 DIAGNOSIS — N186 End stage renal disease: Secondary | ICD-10-CM | POA: Diagnosis not present

## 2015-09-15 DIAGNOSIS — Z23 Encounter for immunization: Secondary | ICD-10-CM | POA: Diagnosis not present

## 2015-09-15 DIAGNOSIS — E876 Hypokalemia: Secondary | ICD-10-CM | POA: Diagnosis not present

## 2015-09-15 DIAGNOSIS — N186 End stage renal disease: Secondary | ICD-10-CM | POA: Diagnosis not present

## 2015-09-15 DIAGNOSIS — D631 Anemia in chronic kidney disease: Secondary | ICD-10-CM | POA: Diagnosis not present

## 2015-09-15 DIAGNOSIS — N2581 Secondary hyperparathyroidism of renal origin: Secondary | ICD-10-CM | POA: Diagnosis not present

## 2015-09-15 DIAGNOSIS — E1129 Type 2 diabetes mellitus with other diabetic kidney complication: Secondary | ICD-10-CM | POA: Diagnosis not present

## 2015-09-17 DIAGNOSIS — N2581 Secondary hyperparathyroidism of renal origin: Secondary | ICD-10-CM | POA: Diagnosis not present

## 2015-09-17 DIAGNOSIS — D631 Anemia in chronic kidney disease: Secondary | ICD-10-CM | POA: Diagnosis not present

## 2015-09-17 DIAGNOSIS — E1129 Type 2 diabetes mellitus with other diabetic kidney complication: Secondary | ICD-10-CM | POA: Diagnosis not present

## 2015-09-17 DIAGNOSIS — Z23 Encounter for immunization: Secondary | ICD-10-CM | POA: Diagnosis not present

## 2015-09-17 DIAGNOSIS — N186 End stage renal disease: Secondary | ICD-10-CM | POA: Diagnosis not present

## 2015-09-17 DIAGNOSIS — E876 Hypokalemia: Secondary | ICD-10-CM | POA: Diagnosis not present

## 2015-09-20 DIAGNOSIS — Z23 Encounter for immunization: Secondary | ICD-10-CM | POA: Diagnosis not present

## 2015-09-20 DIAGNOSIS — E876 Hypokalemia: Secondary | ICD-10-CM | POA: Diagnosis not present

## 2015-09-20 DIAGNOSIS — E1129 Type 2 diabetes mellitus with other diabetic kidney complication: Secondary | ICD-10-CM | POA: Diagnosis not present

## 2015-09-20 DIAGNOSIS — N2581 Secondary hyperparathyroidism of renal origin: Secondary | ICD-10-CM | POA: Diagnosis not present

## 2015-09-20 DIAGNOSIS — N186 End stage renal disease: Secondary | ICD-10-CM | POA: Diagnosis not present

## 2015-09-20 DIAGNOSIS — D631 Anemia in chronic kidney disease: Secondary | ICD-10-CM | POA: Diagnosis not present

## 2015-09-22 DIAGNOSIS — N2581 Secondary hyperparathyroidism of renal origin: Secondary | ICD-10-CM | POA: Diagnosis not present

## 2015-09-22 DIAGNOSIS — Z23 Encounter for immunization: Secondary | ICD-10-CM | POA: Diagnosis not present

## 2015-09-22 DIAGNOSIS — D631 Anemia in chronic kidney disease: Secondary | ICD-10-CM | POA: Diagnosis not present

## 2015-09-22 DIAGNOSIS — E1129 Type 2 diabetes mellitus with other diabetic kidney complication: Secondary | ICD-10-CM | POA: Diagnosis not present

## 2015-09-22 DIAGNOSIS — E876 Hypokalemia: Secondary | ICD-10-CM | POA: Diagnosis not present

## 2015-09-22 DIAGNOSIS — N186 End stage renal disease: Secondary | ICD-10-CM | POA: Diagnosis not present

## 2015-09-24 DIAGNOSIS — N186 End stage renal disease: Secondary | ICD-10-CM | POA: Diagnosis not present

## 2015-09-24 DIAGNOSIS — E1129 Type 2 diabetes mellitus with other diabetic kidney complication: Secondary | ICD-10-CM | POA: Diagnosis not present

## 2015-09-24 DIAGNOSIS — N2581 Secondary hyperparathyroidism of renal origin: Secondary | ICD-10-CM | POA: Diagnosis not present

## 2015-09-24 DIAGNOSIS — D631 Anemia in chronic kidney disease: Secondary | ICD-10-CM | POA: Diagnosis not present

## 2015-09-24 DIAGNOSIS — Z23 Encounter for immunization: Secondary | ICD-10-CM | POA: Diagnosis not present

## 2015-09-24 DIAGNOSIS — E876 Hypokalemia: Secondary | ICD-10-CM | POA: Diagnosis not present

## 2015-09-27 DIAGNOSIS — D631 Anemia in chronic kidney disease: Secondary | ICD-10-CM | POA: Diagnosis not present

## 2015-09-27 DIAGNOSIS — N186 End stage renal disease: Secondary | ICD-10-CM | POA: Diagnosis not present

## 2015-09-27 DIAGNOSIS — E1129 Type 2 diabetes mellitus with other diabetic kidney complication: Secondary | ICD-10-CM | POA: Diagnosis not present

## 2015-09-27 DIAGNOSIS — N2581 Secondary hyperparathyroidism of renal origin: Secondary | ICD-10-CM | POA: Diagnosis not present

## 2015-09-27 DIAGNOSIS — Z23 Encounter for immunization: Secondary | ICD-10-CM | POA: Diagnosis not present

## 2015-09-27 DIAGNOSIS — E876 Hypokalemia: Secondary | ICD-10-CM | POA: Diagnosis not present

## 2015-09-29 DIAGNOSIS — Z23 Encounter for immunization: Secondary | ICD-10-CM | POA: Diagnosis not present

## 2015-09-29 DIAGNOSIS — N2581 Secondary hyperparathyroidism of renal origin: Secondary | ICD-10-CM | POA: Diagnosis not present

## 2015-09-29 DIAGNOSIS — D631 Anemia in chronic kidney disease: Secondary | ICD-10-CM | POA: Diagnosis not present

## 2015-09-29 DIAGNOSIS — E1129 Type 2 diabetes mellitus with other diabetic kidney complication: Secondary | ICD-10-CM | POA: Diagnosis not present

## 2015-09-29 DIAGNOSIS — N186 End stage renal disease: Secondary | ICD-10-CM | POA: Diagnosis not present

## 2015-09-29 DIAGNOSIS — E876 Hypokalemia: Secondary | ICD-10-CM | POA: Diagnosis not present

## 2015-09-30 ENCOUNTER — Ambulatory Visit (INDEPENDENT_AMBULATORY_CARE_PROVIDER_SITE_OTHER): Payer: Medicare Other | Admitting: Cardiology

## 2015-09-30 ENCOUNTER — Encounter: Payer: Self-pay | Admitting: Cardiology

## 2015-09-30 VITALS — BP 153/64 | HR 66 | Ht 64.0 in | Wt 147.4 lb

## 2015-09-30 DIAGNOSIS — I208 Other forms of angina pectoris: Secondary | ICD-10-CM

## 2015-09-30 DIAGNOSIS — Z9861 Coronary angioplasty status: Secondary | ICD-10-CM

## 2015-09-30 DIAGNOSIS — I25708 Atherosclerosis of coronary artery bypass graft(s), unspecified, with other forms of angina pectoris: Secondary | ICD-10-CM

## 2015-09-30 DIAGNOSIS — I2510 Atherosclerotic heart disease of native coronary artery without angina pectoris: Secondary | ICD-10-CM

## 2015-09-30 DIAGNOSIS — I5042 Chronic combined systolic (congestive) and diastolic (congestive) heart failure: Secondary | ICD-10-CM

## 2015-09-30 DIAGNOSIS — I10 Essential (primary) hypertension: Secondary | ICD-10-CM

## 2015-09-30 DIAGNOSIS — E785 Hyperlipidemia, unspecified: Secondary | ICD-10-CM

## 2015-09-30 DIAGNOSIS — I2089 Other forms of angina pectoris: Secondary | ICD-10-CM

## 2015-09-30 DIAGNOSIS — I209 Angina pectoris, unspecified: Secondary | ICD-10-CM

## 2015-09-30 DIAGNOSIS — I25119 Atherosclerotic heart disease of native coronary artery with unspecified angina pectoris: Secondary | ICD-10-CM

## 2015-09-30 DIAGNOSIS — T82857D Stenosis of cardiac prosthetic devices, implants and grafts, subsequent encounter: Secondary | ICD-10-CM

## 2015-09-30 DIAGNOSIS — I953 Hypotension of hemodialysis: Secondary | ICD-10-CM

## 2015-09-30 DIAGNOSIS — T82855D Stenosis of coronary artery stent, subsequent encounter: Secondary | ICD-10-CM

## 2015-09-30 DIAGNOSIS — Z789 Other specified health status: Secondary | ICD-10-CM

## 2015-09-30 MED ORDER — AMLODIPINE BESYLATE 5 MG PO TABS: ORAL_TABLET | ORAL | 3 refills | Status: DC

## 2015-09-30 MED ORDER — ATORVASTATIN CALCIUM 20 MG PO TABS
20.0000 mg | ORAL_TABLET | Freq: Every day | ORAL | 3 refills | Status: DC
Start: 2015-09-30 — End: 2015-12-28

## 2015-09-30 NOTE — Patient Instructions (Addendum)
Restart  ATORVASTATIN 20 MG  AT BEDTIME DAILY  ON NON DIALYSIS  DAYS TAKE AMLODIPINE 5 MG  ONE TABLET   NO OTHER CHANGES   Your physician recommends that you schedule a follow-up appointment in:3 [place] -30 MIN   If you need a refill on your cardiac medications before your next appointment, please call your pharmacy.

## 2015-09-30 NOTE — Progress Notes (Signed)
PCP: Annye Asa, MD  Clinic Note: Chief Complaint  Patient presents with  . Follow-up    CAD -- see problem list  . Medication Refill    Pt has not taking Liptor in 2 months; has called the office for a refill and did not received a refill.    HPI: [name] [name] is a 75 y.o. male with a PMH below who presents today for six-month follow-up. of CAD- CABG & recurrent PCI of mRCA ISR. PCI on the RCA is quite difficult, requires use of GuideLiner catheter - precludes ability to use Rotational Atherectomy. Has had re-do CABG & based upon referral back to Dr. Prescott Gum - not a re-redo candidate. Last cardiac catheterization/PTCA to the RCA was 08/02/2014. 1. Chronic stable angina ([place])   2. CAD S/P percutaneous coronary angioplasty   3. Coronary artery disease involving native coronary artery of native heart with angina pectoris ([place])   4. Chronic combined systolic and diastolic congestive heart failure, NYHA class 2 ([place])   5. Hyperlipidemia with target LDL less than 70   6. Essential hypertension   7. Hypotension of hemodialysis   8. Plavix resistance   9. Coronary artery disease involving coronary bypass graft with other forms of angina pectoris ([place])   10. 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 Noted low blood pressures making hemodialysis difficult. He denied any chest pain however with hemodialysis. NO CP with HD - just anxious with it. Started on Klonopin.  He has not been very active, but has been more active than usual.  No PND, orthopnea or edema -- HD removes ~6L   Recent Hospitalizations: None recently  Studies Reviewed: None  Interval History: [name] [name] presents today for routine follow-up, and has a smile on his face, but does now note that he's been using nitroglycerin about quite a bit more. This week, he is not had used  nitroglycerin very much at all, but last week and the week before, he was using nitroglycerin several times a day for the first time in several months. He says that they changed his dry weight in dialysis down another pound, and since doing that, he is not having to use nitroglycerin as much. He still really doesn't do much of anything as far as more than just walk around the house. Limited in that respect and that if he were to do anything of significant exertion he probably would have angina. He is trying to get out and about some, and can walk in the grocery store with a cart to a certain extent which is more than he had been able to do. Since changing his dry weight, he really has not had any notable edema, PND or orthopnea. He still notes that his symptoms are worse on auscultation dialysis. He is less noticing the positional dizziness that he had noted before notes his blood pressures are actually started being higher.  She still has stable angina, but has had very minimal episodes that have occurred without some type of exacerbating factor. No resting angina.  No palpitations, lightheadedness, dizziness, weakness or syncope/near syncope. No TIA/amaurosis fugax symptoms. No claudication.  He still is a active, but is now trying to do a little bit more. He is going to the beach for the weekend and very excited about it. An active issue now is aneurysmal  dilation of his left upper extremity fistula.  ROS: A comprehensive was performed. Review of Systems  Constitutional: Positive for malaise/fatigue (Less so than previously).  HENT: Negative for nosebleeds.   Respiratory: Negative for cough, shortness of breath and wheezing.   Cardiovascular:       Per history of present illness  Gastrointestinal: Negative for constipation and diarrhea.  Genitourinary: Negative.   Musculoskeletal: Positive for joint pain (Mild arthritis. Hips hurt when walking.). Negative for falls.  Neurological:  Positive for dizziness (After dialysis). Negative for focal weakness and headaches.  Endo/Heme/Allergies: Bruises/bleeds easily.  Psychiatric/Behavioral: Negative for memory loss. The patient is nervous/anxious. The patient does not have insomnia.        Overall his mood and affect improved.  All other systems reviewed and are negative.   Past Medical History:  Diagnosis Date  . Anemia    Likely secondary to history of GI bleed  . BPH (benign prostatic hyperplasia)   . CAD (coronary artery disease) of bypass graft 2006, 2012   In 2006: Occluded SVG-OM noted (SVG-diagonal was previously occluded); 2012: Severe lesion in redo SVG-OM1 --> BMS PCI   . CAD in native artery; and the grafts 1992, 1997, 2002, 2006, 2012   CABG 1992 and redo CABG 2006  . CAD S/P percutaneous coronary angioplasty October 2012; Feb, Apr & Oct 2015; Feb 2016   a) s/p CABG 1992 and redo 2006. b) 10/12: NSTEMI: PCI to SVG-OM1 Integrity BMS 3.5 x 15 (3.85 mm) c) 2/15: UA - PCI mid RCA Xience Ap DES 2.75 x 15 (3.1 mm). d) 4/15 : UA - focal dRCA Resolute DES 2.25 x 8 (2.5 mm). e) 10/15: PCI to mRCA ISR w/ post stent lesion - Promus P 2.75 x 16 (3.25 mm). f) 2/16: NSTEMI: CBA/PTCA of  mRCA ISR (3.5 mm post-dilation); g) 7/16 ISR RCA->CBA/PTCA, residual 20%.  . Carotid arterial disease ([place])    a) Duplex 05/2012: R BULB/PROX ICA: 50-69%, L BULB/PROX ICA: 0-49%. ;; 04/29/2014: 34-19% RICA, 37-90% LICA. Patetn vertebrals,  . Chronic low back pain   . Colon cancer [facility]) 2003   colectomy for CA. no recurrence . never required  radiation or chem  . Diabetes mellitus   . End stage renal disease on dialysis [facility])    Dialyzes at [facility]: Tues/Th/Sat - left upper cavity AV fistula brachiocephalic  . Gout   . History of Non-ST elevated myocardial infarction (non-STEMI) 1992, 2006, 2012; 02/2013  . Hyperlipidemia   . Hypertension   . Hypothyroidism    On supplementation  . Ischemic cardiomyopathy    a) EF 45-50% by echo  2015. b) EF 50% by cath 04/2014.  Marland Kitchen LBBB (left bundle branch block)    Chronic  . Peptic ulcer disease  2004  . Plavix resistance    a) P2Y12 264 in 02/2014 - put on Brilinta but did not tolerate due to SOB. b) cannot be on Effient due to history of stroke. c) Plavix increased to 75mg  BID and Pletal added 04/2014 due to ISR.  Marland Kitchen Skin cancer   . Stroke [facility]) 1998    Past Surgical History:  Procedure Laterality Date  . APPENDECTOMY    . AV FISTULA REPAIR Left 05/2009  . BACK SURGERY    . CARDIAC CATHETERIZATION  10/16/2010   patent  LIMA to the LAD , PATENT  svg TO 2nd marginals ,occluded PLA with collaterals and SVG to the OM  had 90% stenosis  was txlge  bare - metal  3.5  Integrity ten  postdilate  36-37  mm    . CARDIAC CATHETERIZATION  03/22/2004   loss of both SVG,severe disease in prox and ostial  circ not amenable to invention. mod diease distal LAD  AFTER BYPASS GRAFT;-CVTS to evaluate   . CARDIAC CATHETERIZATION N/A 08/03/2014   Procedure: Left Heart Cath and Cors/Grafts Angiography;  Surgeon: Leonie Man, MD;  Location: [place] CV LAB;  Service: Cardiovascular;  Laterality: N/A;  . CARDIAC CATHETERIZATION N/A 08/03/2014   Procedure: Coronary Balloon Angioplasty;  Surgeon: Leonie Man, MD;  Location: [place] CV LAB;  Service: Cardiovascular;: Aggressive Cutting Balloon - [place] Balloon PTCA of ISR site in mRCA (3 stent layer)  . CAROTID DOPPLER  05/23/2012   ABN CAROTID-- RGT BULB/PROX ICA mild to mod 50-60%;lft bulb/prox mild to mod 0-49%;left subclavian abn waveforms consistent with patients lft arm A/V fistula  . CATARACT EXTRACTION W/ INTRAOCULAR LENS  IMPLANT, BILATERAL Bilateral   . CHOLECYSTECTOMY  2009  . COLON RESECTION  2003   transverse and proximal descending w/ primar anastomosis  . COLON SURGERY  2003   "cancer"  . CORONARY ANGIOPLASTY  04/03/1995   OM and Circ  . CORONARY ANGIOPLASTY  02/01/2000   CIRC  . CORONARY ANGIOPLASTY  02/09/14   Cutting Balloon  PTCA of mRCA ISR 99% - 3.5 mm  . CORONARY ARTERY BYPASS GRAFT  08/22/1990   INITIAL:LIMA to LAD, SVG to  Diagonal, SVG to OM  . CORONARY ARTERY BYPASS GRAFT  2006   SVG to OM1,SVG to OM2 with patent LIMA  to LAD and occluded vein  graft to the diagonal  and occluded vein grft to OM from  prior  surgery  . DOPPLER ECHOCARDIOGRAPHY  01/25/2012   EF 50 to 55%  . EVENT MONITOR  01/23/2012-02/06/2012   SINUS ,LBBB,unifocal PVCs  . LEFT AND RIGHT HEART CATHETERIZATION WITH CORONARY/GRAFT ANGIOGRAM N/A 04/20/2014   Procedure: LEFT AND RIGHT HEART CATHETERIZATION WITH Beatrix Fetters;  Surgeon: Sherren Mocha, MD;  Location: [facility] CATH LAB;  Service: Cardiovascular;  Laterality: N/A;  . LEFT HEART CATHETERIZATION WITH CORONARY ANGIOGRAM N/A 02/23/2013   Procedure: LEFT HEART CATHETERIZATION WITH CORONARY ANGIOGRAM;  Surgeon: Lorretta Harp, MD;  Location: [facility] CATH LAB;  Service: Cardiovascular;  Laterality: N/A;  . LEFT HEART CATHETERIZATION WITH CORONARY/GRAFT ANGIOGRAM N/A 04/30/2013   Procedure: LEFT HEART CATHETERIZATION WITH Beatrix Fetters;  Surgeon: Leonie Man, MD;  Location: [facility] CATH LAB;  Service: Cardiovascular;  Laterality: N/A;  . LEFT HEART CATHETERIZATION WITH CORONARY/GRAFT ANGIOGRAM N/A 10/15/2013   Procedure: LEFT HEART CATHETERIZATION WITH Beatrix Fetters;  Surgeon: Leonie Man, MD;  Location: [facility] CATH LAB;  Service: Cardiovascular;  Laterality: N/A;  . LEFT HEART CATHETERIZATION WITH CORONARY/GRAFT ANGIOGRAM N/A 02/09/2014   Procedure: LEFT HEART CATHETERIZATION WITH Beatrix Fetters;  Surgeon: Leonie Man, MD;  Location: [facility] CATH LAB;  Service: Cardiovascular;  Laterality: N/A;  . Lower Extremity Arterial Dopplers  October 2014   Calcified but non-occlusive peripheral arteries. No evidence of stenosis  . NM MYOCAR PERF WALL MOTION  10/12/2011   EF 54%,LV normal ; no signifiant ischemia  . PERCUTANEOUS CORONARY STENT INTERVENTION (PCI-S)  02/23/2013     mid RCA 80% & 70% - Xience 2.75 mm x 15 mm ( 3.46mm) ; LIMA-LAD patent, SVG-OM patent (stent patent); SVG-Diag patent.  Marland Kitchen PERCUTANEOUS CORONARY STENT INTERVENTION (PCI-S)  April 2015   dRCA - Integrity Resolute DES   2.25 mm x 26mm (2.5 mm)  .  PERCUTANEOUS CORONARY STENT INTERVENTION (PCI-S)  Oct 15 2013   [place] Angina: mRCA ISR with post-stent stenosis -- [place] PTCA & PCI Promus Premier DES 2.75 mm x 16 mm (3.25 mm)  . POSTERIOR LUMBAR FUSION    . TRANSTHORACIC ECHOCARDIOGRAM  02/23/2013   Moderate concentric hypertrophy. EF 4500%. Septal bounce. Grade 2 diastolic dysfunction (pseudo-normal - severely elevated filling pressures) mild to moderate MR and moderate LA dilation to    Prior to Admission medications   Medication Sig Start Date End Date Taking? Authorizing Provider  Besifloxacin HCl (BESIVANCE) 0.6 % SUSP Place 1 drop into both eyes See admin instructions. Use eye drops 4 times daily for 2 days following injection by Dr. Zigmund Daniel (every 10 weeks)   Yes Historical Provider, MD  carvedilol (COREG) 25 MG tablet TAKE ONE TABLET BY MOUTH TWICE DAILY WITH MEALS 04/04/15  Yes Midge Minium, MD  cilostazol (PLETAL) 100 MG tablet TAKE ONE TABLET BY MOUTH TWICE DAILY 05/23/15  Yes Leonie Man, MD  clonazePAM (KLONOPIN) 0.5 MG tablet TAKE ONE-HALF TABLET BY MOUTH DAILY AS NEEDED FOR ANXIETY 02/01/15  Yes Midge Minium, MD  clopidogrel (PLAVIX) 75 MG tablet Take 1 tablet (75 mg total) by mouth daily. 06/17/15  Yes Leonie Man, MD  glucose blood (TRUE METRIX BLOOD GLUCOSE TEST) test strip 1 each by Other route as needed for other. Use as instructed to test sugars 2-3 times daily. Dx. X38.18 11/05/14  Yes Midge Minium, MD  insulin NPH Human (HUMULIN N,NOVOLIN N) 100 UNIT/ML injection Inject 10-20 Units into the skin 2 (two) times daily as needed (CBG >130). CBG 130-150 inject 10 units, >150 inject 20 units   Yes Historical Provider, MD  isosorbide mononitrate (IMDUR) 120 MG 24  hr tablet Take 1 tablet (120 mg total) by mouth daily. 06/17/15  Yes Leonie Man, MD  levothyroxine (SYNTHROID, LEVOTHROID) 125 MCG tablet TAKE ONE TABLET BY MOUTH ONCE DAILY BEFORE BREAKFAST 05/18/15  Yes Midge Minium, MD  lidocaine-prilocaine (EMLA) cream Apply 1 application topically See admin instructions. Apply prior to dialysis treatments on Tuesday, Thursday and Saturday   Yes Historical Provider, MD  loperamide (IMODIUM A-D) 2 MG tablet Take 4 mg by mouth 2 (two) times daily as needed for diarrhea or loose stools.   Yes Historical Provider, MD  NITROSTAT 0.4 MG SL tablet Place 1 tablet (0.4 mg total) under the tongue every 5 (five) minutes as needed for chest pain. Max 3 doses. 03/28/15  Yes Leonie Man, MD  sevelamer carbonate (RENVELA) 800 MG tablet Take 1,600-2,400 mg by mouth See admin instructions. Take 3 tablets (2400 mg) with meals and 2 tablets (1600 mg) with snacks   Yes Historical Provider, MD  atorvastatin (LIPITOR) 40 MG tablet Take 1 tablet (40 mg total) by mouth at bedtime. Patient not taking: Reported on 09/30/2015 07/21/14   Midge Minium, MD      Allergies  Allergen Reactions  . Brilinta [Ticagrelor] Shortness Of Breath  . Shellfish Allergy Other (See Comments)    Causes gout flare-ups   Social History   Social History  . Marital status: Married    Spouse name: N/A  . Number of children: 3  . Years of education: N/A   Social History Main Topics  . Smoking status: Never Smoker  . Smokeless tobacco: Never Used  . Alcohol use No  . Drug use: No  . Sexual activity: No   Other Topics Concern  . None  Social History Narrative   Long-term patient of Dr. Rollene Fare.   Married father of 79, grandfather 73.   Never smoked.   Not exercising D2 hip and back pain & now Angina    family history includes Diabetes in his father and mother; Heart attack in his father and mother; Heart disease in his father and mother; Hypertension in his father and mother;  Stroke in his paternal aunt and paternal uncle.   Wt Readings from Last 3 Encounters:  09/30/15 66.9 kg (147 lb 6.4 oz)  08/10/15 67.3 kg (148 lb 6 oz)  06/17/15 69.5 kg (153 lb 3.2 oz)    PHYSICAL EXAM BP (!) 153/64   Pulse 66   Ht 5\' 4"  (1.626 m)   Wt 66.9 kg (147 lb 6.4 oz)   BMI 25.30 kg/m  General appearance: alert, cooperative, appears stated age, no distress; and Borderline obese. Chronically ill-appearing, pale, well groomed.Marland Kitchen answers questions appropriately.  Neck: no adenopathy, no carotid bruit, mild JVD; Supple, symmetrical, trachea midline  Lungs: CTAB, normal percussion bilaterally and Nonlabored, good air movement  Heart: normal apical impulse, RRR, S1, S2 normal, S4 present, SEM 2/6, crescendo, decrescendo and harsh at 2nd right intercostal space, no click and no rub  Abdomen: soft, non-tender; bowel sounds normal; no masses, no organomegaly and Mild truncal obesity  Extremities: extremities normal, atraumatic, no cyanosis or edema and fistula in the left upper arm with good thrill - very aneurysmal Pulses: 2+ and symmetric  Neurologic: Grossly normal Pscyh;More up-beat mood & affect   Adult ECG Report Not checked  Other studies Reviewed: Additional studies/ records that were reviewed today include:  Recent Labs:    Lab Results  Component Value Date   CHOL 107 08/10/2015   HDL 28.80 (L) 08/10/2015   LDLCALC 56 08/10/2015   LDLDIRECT 33.5 12/22/2012   TRIG 113.0 08/10/2015   CHOLHDL 4 08/10/2015    ASSESSMENT / PLAN: Problem List Items Addressed This Visit    Plavix resistance (Chronic)    Back down once daily Plavix. Also on Pletal.      Hypotension of hemodialysis (Chronic)    His blood pressures actually had a day. Well-nourished gout is treat on off days. He may even need to take a reduced dose of carvedilol on his mornings of dialysis.      Relevant Medications   atorvastatin (LIPITOR) 20 MG tablet   amLODipine (NORVASC) 5 MG tablet    Hyperlipidemia with target LDL less than 70 (Chronic)    Cholesterol levels showed very low overall levels. I think we can reduce his atorvastatin level to 20 mg daily.      Relevant Medications   atorvastatin (LIPITOR) 20 MG tablet   amLODipine (NORVASC) 5 MG tablet   Other Relevant Orders   EKG 12-Lead   Essential hypertension (Chronic)    Usually tends to be low at dialysis but otherwise seems elevated today. --> Adding amlodipine on off days.      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 Overall angina has improved and has not had a heart catheterization in over a year -> aggressive post-dilation evidence of restenosis segment in the RCA.   Plan: He  has been on stable dose of statin, beta blocker, Plavix and Imdur.   Add amlodipine 5 mg daily on off dialysis days.  Agree with reduced dry weight. He is losing muscle mass and therefore his dry weight is gradually being lowered.      Relevant Medications   atorvastatin (LIPITOR) 20 MG tablet   amLODipine (NORVASC) 5 MG tablet   Chronic stable angina (HCC) - Primary (Chronic)    He intermittently has worsening episodes of angina, but for the last year has relatively been stable. I wonder if his new dry weight is reasonable I he no longer having to take the nitroglycerin that he was taken to weeks ago. He may be having some symptoms related to increased wall stress from diastolic dysfunction. His blood pressures are higher than usual. He is already on max dose beta blocker and Imdur along with Pletal.  Plan: Add amlodipine 5 mg daily on off days from dialysis.      Relevant Medications   atorvastatin (LIPITOR) 20 MG tablet     amLODipine (NORVASC) 5 MG tablet   Chronic combined systolic and diastolic congestive heart failure, NYHA class 2 (HCC) (Chronic)    No real heart failure symptoms, but I think this may be contributing to his angina. Volume control by dialysis with reducing dry weight is symptoms have improved. Plan: Adding amlodipine on off days from dialysis      Relevant Medications   atorvastatin (LIPITOR) 20 MG tablet   amLODipine (NORVASC) 5 MG tablet   Other Relevant Orders   EKG 12-Lead   CAD S/P percutaneous coronary angioplasty (Chronic)    Extensive stents in the RCA with essentially stem to stern RCA stents. Several is a restenosis post dilations. Not a lot of options with multiple overlapping stents and stent with in-stent.  We are now over one year since his last PTCA. Are pretty stable regimen now with cast channel blocker being added for antianginal effect.      Relevant Medications   atorvastatin (LIPITOR) 20 MG tablet   amLODipine (NORVASC) 5 MG tablet   Other Relevant Orders   EKG 12-Lead    Other Visit Diagnoses    Coronary artery disease involving coronary bypass graft with other forms of angina pectoris (HCC)  (Chronic)      Relevant Medications   atorvastatin (LIPITOR) 20 MG tablet   amLODipine (NORVASC) 5 MG tablet      Current medicines are reviewed at length with the patient today. (+/- concerns) None The following changes have been made: None.   Refill sublingual nitroglycerin  Add amlodipine 5 mg on off days from dialysis.  Reduce atorvastatin to 20 g daily at bedtime.   Studies Ordered:   Orders Placed This Encounter  Procedures  . EKG 12-Lead   ROV 3  MONTHS WITH DR Jorrell Kuster.   [name], M.D., M.S. Interventional Cardiologist   Pager # [phone] Phone # [phone] [address] [place], [place] [address]

## 2015-10-01 DIAGNOSIS — N2581 Secondary hyperparathyroidism of renal origin: Secondary | ICD-10-CM | POA: Diagnosis not present

## 2015-10-01 DIAGNOSIS — N186 End stage renal disease: Secondary | ICD-10-CM | POA: Diagnosis not present

## 2015-10-01 DIAGNOSIS — E876 Hypokalemia: Secondary | ICD-10-CM | POA: Diagnosis not present

## 2015-10-01 DIAGNOSIS — D631 Anemia in chronic kidney disease: Secondary | ICD-10-CM | POA: Diagnosis not present

## 2015-10-01 DIAGNOSIS — E1129 Type 2 diabetes mellitus with other diabetic kidney complication: Secondary | ICD-10-CM | POA: Diagnosis not present

## 2015-10-01 DIAGNOSIS — Z23 Encounter for immunization: Secondary | ICD-10-CM | POA: Diagnosis not present

## 2015-10-02 ENCOUNTER — Encounter: Payer: Self-pay | Admitting: Cardiology

## 2015-10-02 DIAGNOSIS — I208 Other forms of angina pectoris: Secondary | ICD-10-CM | POA: Insufficient documentation

## 2015-10-02 NOTE — Assessment & Plan Note (Signed)
Cholesterol levels showed very low overall levels. I think we can reduce his atorvastatin level to 20 mg daily.

## 2015-10-02 NOTE — Assessment & Plan Note (Addendum)
He intermittently has worsening episodes of angina, but for the last year has relatively been stable. I wonder if his new dry weight is reasonable I he no longer having to take the nitroglycerin that he was taken to weeks ago. He may be having some symptoms related to increased wall stress from diastolic dysfunction. His blood pressures are higher than usual. He is already on max dose beta blocker and Imdur along with Pletal.  Plan: Add amlodipine 5 mg daily on off days from dialysis.

## 2015-10-02 NOTE — Assessment & Plan Note (Signed)
Back down once daily Plavix. Also on Pletal.

## 2015-10-02 NOTE — Assessment & Plan Note (Signed)
Usually tends to be low at dialysis but otherwise seems elevated today. --> Adding amlodipine on off days.

## 2015-10-02 NOTE — Assessment & Plan Note (Signed)
His blood pressures actually had a day. Well-nourished gout is treat on off days. He may even need to take a reduced dose of carvedilol on his mornings of dialysis.

## 2015-10-02 NOTE — Assessment & Plan Note (Signed)
No real heart failure symptoms, but I think this may be contributing to his angina. Volume control by dialysis with reducing dry weight is symptoms have improved. Plan: Adding amlodipine on off days from dialysis

## 2015-10-02 NOTE — Assessment & Plan Note (Signed)
Extensive stents in the RCA with essentially stem to stern RCA stents. Several is a restenosis post dilations. Not a lot of options with multiple overlapping stents and stent with in-stent.  We are now over one year since his last PTCA. Are pretty stable regimen now with cast channel blocker being added for antianginal effect.

## 2015-10-02 NOTE — Assessment & Plan Note (Signed)
Chronic problem with chronic stable angina. Overall angina has improved and has not had a heart catheterization in over a year -> aggressive post-dilation evidence of restenosis segment in the RCA.   Plan: He has been on stable dose of statin, beta blocker, Plavix and Imdur.   Add amlodipine 5 mg daily on off dialysis days.  Agree with reduced dry weight. He is losing muscle mass and therefore his dry weight is gradually being lowered.

## 2015-10-03 DIAGNOSIS — C44311 Basal cell carcinoma of skin of nose: Secondary | ICD-10-CM | POA: Diagnosis not present

## 2015-10-04 DIAGNOSIS — N186 End stage renal disease: Secondary | ICD-10-CM | POA: Diagnosis not present

## 2015-10-04 DIAGNOSIS — N2581 Secondary hyperparathyroidism of renal origin: Secondary | ICD-10-CM | POA: Diagnosis not present

## 2015-10-04 DIAGNOSIS — D631 Anemia in chronic kidney disease: Secondary | ICD-10-CM | POA: Diagnosis not present

## 2015-10-04 DIAGNOSIS — E876 Hypokalemia: Secondary | ICD-10-CM | POA: Diagnosis not present

## 2015-10-04 DIAGNOSIS — Z23 Encounter for immunization: Secondary | ICD-10-CM | POA: Diagnosis not present

## 2015-10-04 DIAGNOSIS — E1129 Type 2 diabetes mellitus with other diabetic kidney complication: Secondary | ICD-10-CM | POA: Diagnosis not present

## 2015-10-05 ENCOUNTER — Encounter: Payer: Self-pay | Admitting: Vascular Surgery

## 2015-10-06 ENCOUNTER — Other Ambulatory Visit: Payer: Self-pay | Admitting: *Deleted

## 2015-10-06 DIAGNOSIS — N2581 Secondary hyperparathyroidism of renal origin: Secondary | ICD-10-CM | POA: Diagnosis not present

## 2015-10-06 DIAGNOSIS — T82510A Breakdown (mechanical) of surgically created arteriovenous fistula, initial encounter: Secondary | ICD-10-CM

## 2015-10-06 DIAGNOSIS — Z23 Encounter for immunization: Secondary | ICD-10-CM | POA: Diagnosis not present

## 2015-10-06 DIAGNOSIS — E1129 Type 2 diabetes mellitus with other diabetic kidney complication: Secondary | ICD-10-CM | POA: Diagnosis not present

## 2015-10-06 DIAGNOSIS — D631 Anemia in chronic kidney disease: Secondary | ICD-10-CM | POA: Diagnosis not present

## 2015-10-06 DIAGNOSIS — N186 End stage renal disease: Secondary | ICD-10-CM | POA: Diagnosis not present

## 2015-10-06 DIAGNOSIS — E876 Hypokalemia: Secondary | ICD-10-CM | POA: Diagnosis not present

## 2015-10-08 DIAGNOSIS — Z992 Dependence on renal dialysis: Secondary | ICD-10-CM | POA: Diagnosis not present

## 2015-10-08 DIAGNOSIS — Z23 Encounter for immunization: Secondary | ICD-10-CM | POA: Diagnosis not present

## 2015-10-08 DIAGNOSIS — N186 End stage renal disease: Secondary | ICD-10-CM | POA: Diagnosis not present

## 2015-10-08 DIAGNOSIS — E1129 Type 2 diabetes mellitus with other diabetic kidney complication: Secondary | ICD-10-CM | POA: Diagnosis not present

## 2015-10-08 DIAGNOSIS — D631 Anemia in chronic kidney disease: Secondary | ICD-10-CM | POA: Diagnosis not present

## 2015-10-08 DIAGNOSIS — N2581 Secondary hyperparathyroidism of renal origin: Secondary | ICD-10-CM | POA: Diagnosis not present

## 2015-10-08 DIAGNOSIS — E876 Hypokalemia: Secondary | ICD-10-CM | POA: Diagnosis not present

## 2015-10-11 DIAGNOSIS — E1129 Type 2 diabetes mellitus with other diabetic kidney complication: Secondary | ICD-10-CM | POA: Diagnosis not present

## 2015-10-11 DIAGNOSIS — N2581 Secondary hyperparathyroidism of renal origin: Secondary | ICD-10-CM | POA: Diagnosis not present

## 2015-10-11 DIAGNOSIS — N186 End stage renal disease: Secondary | ICD-10-CM | POA: Diagnosis not present

## 2015-10-11 DIAGNOSIS — E876 Hypokalemia: Secondary | ICD-10-CM | POA: Diagnosis not present

## 2015-10-11 DIAGNOSIS — D631 Anemia in chronic kidney disease: Secondary | ICD-10-CM | POA: Diagnosis not present

## 2015-10-12 ENCOUNTER — Ambulatory Visit (HOSPITAL_COMMUNITY)
Admission: RE | Admit: 2015-10-12 | Discharge: 2015-10-12 | Disposition: A | Discharge status: Home / Self Care | Payer: Medicare Other | Source: Ambulatory Visit | Attending: Vascular Surgery | Admitting: Vascular Surgery

## 2015-10-12 ENCOUNTER — Encounter: Payer: Self-pay | Admitting: Vascular Surgery

## 2015-10-12 ENCOUNTER — Ambulatory Visit (INDEPENDENT_AMBULATORY_CARE_PROVIDER_SITE_OTHER): Payer: Medicare Other | Admitting: Vascular Surgery

## 2015-10-12 VITALS — BP 124/80 | HR 62 | Temp 98.6°F | Resp 16 | Ht 64.0 in | Wt 148.0 lb

## 2015-10-12 DIAGNOSIS — T82510A Breakdown (mechanical) of surgically created arteriovenous fistula, initial encounter: Secondary | ICD-10-CM | POA: Insufficient documentation

## 2015-10-12 DIAGNOSIS — N186 End stage renal disease: Secondary | ICD-10-CM

## 2015-10-12 DIAGNOSIS — I209 Angina pectoris, unspecified: Secondary | ICD-10-CM | POA: Diagnosis not present

## 2015-10-12 DIAGNOSIS — Z992 Dependence on renal dialysis: Secondary | ICD-10-CM

## 2015-10-12 DIAGNOSIS — Y832 Surgical operation with anastomosis, bypass or graft as the cause of abnormal reaction of the patient, or of later complication, without mention of misadventure at the time of the procedure: Secondary | ICD-10-CM | POA: Diagnosis not present

## 2015-10-12 NOTE — Progress Notes (Signed)
Patient name: [name] [name] MRN: [id number] DOB: 1940-10-08 Sex: male  REASON FOR CONSULT: Aneurysm of AV fistula left arm. Referred by Dr. Marval Regal.   HPI: [name] [name] is a 75 y.o. male, who was referred for evaluation of a large aneurysm in his left upper arm fistula. He has had this fistula for some time and has not had any problems with it during dialysis. However he has developed a large aneurysm in the central portion of the fistula. He dialyzes on Tuesdays Thursdays and Saturdays.  He denies paresthesias or pain in the left upper extremity.  Past Medical History:  Diagnosis Date  . Anemia    Likely secondary to history of GI bleed  . BPH (benign prostatic hyperplasia)   . CAD (coronary artery disease) of bypass graft 2006, 2012   In 2006: Occluded SVG-OM noted (SVG-diagonal was previously occluded); 2012: Severe lesion in redo SVG-OM1 --> BMS PCI   . CAD in native artery; and the grafts 1992, 1997, 2002, 2006, 2012   CABG 1992 and redo CABG 2006  . CAD S/P percutaneous coronary angioplasty October 2012; Feb, Apr & Oct 2015; Feb 2016   a) s/p CABG 1992 and redo 2006. b) 10/12: NSTEMI: PCI to SVG-OM1 Integrity BMS 3.5 x 15 (3.85 mm) c) 2/15: UA - PCI mid RCA Xience Ap DES 2.75 x 15 (3.1 mm). d) 4/15 : UA - focal dRCA Resolute DES 2.25 x 8 (2.5 mm). e) 10/15: PCI to mRCA ISR w/ post stent lesion - Promus P 2.75 x 16 (3.25 mm). f) 2/16: NSTEMI: CBA/PTCA of  mRCA ISR (3.5 mm post-dilation); g) 7/16 ISR RCA->CBA/PTCA, residual 20%.  . Carotid arterial disease ([place])    a) Duplex 05/2012: R BULB/PROX ICA: 50-69%, L BULB/PROX ICA: 0-49%. ;; 04/29/2014: 73-22% RICA, 02-54% LICA. Patetn vertebrals,  . Chronic low back pain   . Colon cancer [facility]) 2003   colectomy for CA. no recurrence . never required  radiation or chem  . Diabetes mellitus   . End stage renal disease on dialysis [facility])    Dialyzes at [facility]: Tues/Th/Sat - left upper cavity AV fistula brachiocephalic  . Gout     . History of Non-ST elevated myocardial infarction (non-STEMI) 1992, 2006, 2012; 02/2013  . Hyperlipidemia   . Hypertension   . Hypothyroidism    On supplementation  . Ischemic cardiomyopathy    a) EF 45-50% by echo 2015. b) EF 50% by cath 04/2014.  Marland Kitchen LBBB (left bundle branch block)    Chronic  . Peptic ulcer disease  2004  . Plavix resistance    a) P2Y12 264 in 02/2014 - put on Brilinta but did not tolerate due to SOB. b) cannot be on Effient due to history of stroke. c) Plavix increased to 75mg  BID and Pletal added 04/2014 due to ISR.  Marland Kitchen Skin cancer   . Stroke [facility]) 1998    Family History  Problem Relation Age of Onset  . Heart disease Mother   . Hypertension Mother   . Diabetes Mother   . Heart attack Mother   . Heart disease Father   . Hypertension Father   . Diabetes Father   . Heart attack Father   . Stroke Paternal Aunt   . Stroke Paternal Uncle     SOCIAL HISTORY: Social History   Social History  . Marital status: Married    Spouse name: N/A  . Number of children: 3  . Years of education: N/A  Occupational History  . Not on file.   Social History Main Topics  . Smoking status: Never Smoker  . Smokeless tobacco: Never Used  . Alcohol use No  . Drug use: No  . Sexual activity: No   Other Topics Concern  . Not on file   Social History Narrative   Long-term patient of Dr. Rollene Fare.   Married father of 26, grandfather 28.   Never smoked.   Not exercising D2 hip and back pain & now Angina    Allergies  Allergen Reactions  . Brilinta [Ticagrelor] Shortness Of Breath  . Shellfish Allergy Other (See Comments)    Causes gout flare-ups    Current Outpatient Prescriptions  Medication Sig Dispense Refill  . amLODipine (NORVASC) 5 MG tablet Take one tablet on non dialysis day  By mouth. 90 tablet 3  . atorvastatin (LIPITOR) 20 MG tablet Take 1 tablet (20 mg total) by mouth daily. 90 tablet 3  . Besifloxacin HCl (BESIVANCE) 0.6 % SUSP Place 1 drop into  both eyes See admin instructions. Use eye drops 4 times daily for 2 days following injection by Dr. Zigmund Daniel (every 10 weeks)    . carvedilol (COREG) 25 MG tablet TAKE ONE TABLET BY MOUTH TWICE DAILY WITH MEALS 180 tablet 1  . cilostazol (PLETAL) 100 MG tablet TAKE ONE TABLET BY MOUTH TWICE DAILY 180 tablet 1  . clonazePAM (KLONOPIN) 0.5 MG tablet TAKE ONE-HALF TABLET BY MOUTH DAILY AS NEEDED FOR ANXIETY 30 tablet 0  . clopidogrel (PLAVIX) 75 MG tablet Take 1 tablet (75 mg total) by mouth daily. 90 tablet 3  . glucose blood (TRUE METRIX BLOOD GLUCOSE TEST) test strip 1 each by Other route as needed for other. Use as instructed to test sugars 2-3 times daily. Dx. E11.40 100 each 12  . insulin NPH Human (HUMULIN N,NOVOLIN N) 100 UNIT/ML injection Inject 10-20 Units into the skin 2 (two) times daily as needed (CBG >130). CBG 130-150 inject 10 units, >150 inject 20 units    . isosorbide mononitrate (IMDUR) 120 MG 24 hr tablet Take 1 tablet (120 mg total) by mouth daily. 90 tablet 3  . levothyroxine (SYNTHROID, LEVOTHROID) 125 MCG tablet TAKE ONE TABLET BY MOUTH ONCE DAILY BEFORE BREAKFAST 90 tablet 1  . lidocaine-prilocaine (EMLA) cream Apply 1 application topically See admin instructions. Apply prior to dialysis treatments on Tuesday, Thursday and Saturday    . loperamide (IMODIUM A-D) 2 MG tablet Take 4 mg by mouth 2 (two) times daily as needed for diarrhea or loose stools.    Marland Kitchen NITROSTAT 0.4 MG SL tablet Place 1 tablet (0.4 mg total) under the tongue every 5 (five) minutes as needed for chest pain. Max 3 doses. 25 tablet 3  . sevelamer carbonate (RENVELA) 800 MG tablet Take 1,600-2,400 mg by mouth See admin instructions. Take 3 tablets (2400 mg) with meals and 2 tablets (1600 mg) with snacks     No current facility-administered medications for this visit.     REVIEW OF SYSTEMS:  [X]  denotes positive finding, [ ]  denotes negative finding Cardiac  Comments:  Chest pain or chest pressure: X    Shortness of breath upon exertion: X   Short of breath when lying flat: X   Irregular heart rhythm:        Vascular    Pain in calf, thigh, or hip brought on by ambulation:    Pain in feet at night that wakes you up from your sleep:     Blood  clot in your veins:    Leg swelling:         Pulmonary    Oxygen at home:    Productive cough:     Wheezing:         Neurologic    Sudden weakness in arms or legs:     Sudden numbness in arms or legs:     Sudden onset of difficulty speaking or slurred speech:    Temporary loss of vision in one eye:     Problems with dizziness:         Gastrointestinal    Blood in stool:     Vomited blood:         Genitourinary    Burning when urinating:     Blood in urine:        Psychiatric    Major depression:         Hematologic    Bleeding problems:    Problems with blood clotting too easily:        Skin    Rashes or ulcers:        Constitutional    Fever or chills:      PHYSICAL EXAM: Vitals:   10/12/15 1552  BP: 124/80  Pulse: 62  Resp: 16  Temp: 98.6 F (37 C)  TempSrc: Oral  SpO2: 95%  Weight: 148 lb (67.1 kg)  Height: 5\' 4"  (1.626 m)    GENERAL: The patient is a well-nourished male, in no acute distress. The vital signs are documented above. CARDIAC: There is a regular rate and rhythm.  VASCULAR: He has a good thrill in his left upper arm fistula. There is a large aneurysm in the central portion of the fistula. He has a palpable left radial pulse. PULMONARY: There is good air exchange bilaterally without wheezing or rales. NEUROLOGIC: No focal weakness or paresthesias are detected. SKIN: There are no ulcers or rashes noted. PSYCHIATRIC: The patient has a normal affect.  DATA:   DUPLEX OF LEFT UPPER ARM FISTULA: No significant narrowing is noted in his left upper arm fistula. He has the large aneurysmal area as noted.  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 Hopefully we will be able to plicate this but more than likely we would have to place an interposition graft in this area. I think he still will have enough area to cannulate the fistula above and below the revision. This has been scheduled for 10/28/2015 which is a nondialysis day. I have discussed the procedure with potential complications with the patient and he is agreeable to proceed.   Deitra Mayo [facility] [place] [phone]

## 2015-10-13 ENCOUNTER — Other Ambulatory Visit: Payer: Self-pay

## 2015-10-15 DIAGNOSIS — N2581 Secondary hyperparathyroidism of renal origin: Secondary | ICD-10-CM | POA: Diagnosis not present

## 2015-10-15 DIAGNOSIS — N186 End stage renal disease: Secondary | ICD-10-CM | POA: Diagnosis not present

## 2015-10-21 ENCOUNTER — Encounter (INDEPENDENT_AMBULATORY_CARE_PROVIDER_SITE_OTHER): Payer: Medicare Other | Admitting: Ophthalmology

## 2015-10-21 DIAGNOSIS — E11311 Type 2 diabetes mellitus with unspecified diabetic retinopathy with macular edema: Secondary | ICD-10-CM | POA: Diagnosis not present

## 2015-10-21 DIAGNOSIS — I10 Essential (primary) hypertension: Secondary | ICD-10-CM

## 2015-10-21 DIAGNOSIS — E113313 Type 2 diabetes mellitus with moderate nonproliferative diabetic retinopathy with macular edema, bilateral: Secondary | ICD-10-CM | POA: Diagnosis not present

## 2015-10-21 DIAGNOSIS — H35033 Hypertensive retinopathy, bilateral: Secondary | ICD-10-CM | POA: Diagnosis not present

## 2015-10-21 DIAGNOSIS — H43813 Vitreous degeneration, bilateral: Secondary | ICD-10-CM

## 2015-10-27 ENCOUNTER — Encounter (HOSPITAL_COMMUNITY): Payer: Self-pay | Admitting: *Deleted

## 2015-10-27 NOTE — Progress Notes (Signed)
Mr Delcastillo has a history of CAD and chronic stable angina.  Patient's cardiologist is Dr Ellyn Hack.  Patient was seen by Dr Ellyn Hack on 09/30/15.  Mr Quinto states that he took NTG twice last week, but has not taken it this week.  Mr Mudry has a history of diabetes but is not on medication any longer. Patient continues to check CBGs and will check it in the am.  I instructed patient to check CBG to check CBG and if it is less than 70 to treat it with Glucose Gel, Glucose tablets or 1/2 cup of clear juice like apple juice or cranberry juice, or 1/2 cup of regular soda. (not cream soda). I instructed patient to recheck CBG in 15 minutes and if CBG is not greater than 70, to  Call [phone] (pre- op). If it is before pre-op opens to retreat as before and recheck CBG in 15 minutes. I told patient to make note of time that liquid is taken and amount, that surgical time may have to be adjusted.

## 2015-10-27 NOTE — Progress Notes (Signed)
Anesthesia Chart Review:  Pt is a same day work up.   Pt is is 75 year old male scheduled for excision of AV fistula aneurysm on 10/28/2015 with Curt Jews, MD.   - Cardiologist is [name], MD, last office visit 09/30/15 - PCP is Annye Asa, MD, last office visit 08/16/15  PMH includes:  CAD (s/p CABG 1992, s/p redo CABG 2006; NSTEMI, BMS to SVG-OM1 2012; DES to mid RCA 02/2013; DES to distal RCA 04/2013; DES mid RCA 10/2013; CBA/PTCA mid RCA ISR 02/2014; CBA/PTCA RCA ISR 07/2014), ischemic cardiomyopathy, LBBB, HTN, stroke, DM, hyperlipidemia, ESRD on dialysis, resistance to plavix, carotid stenosis, hypothyroidism, anemia, colon cancer. Never smoker. BMI 25.5  Medications include: Amlodipine, Lipitor, carvedilol, Pletal, Plavix, Imdur, levothyroxine. Pt to continue plavix perioperatively  Labs will be obtained DOS  EKG 09/30/15: NSR. Rightward axis. LBBB  Carotid duplex 11/03/14: 40-59% bilateral ICA stenoses.  Cardiac cath 08/03/14:  1. Mid RCA stented segment has a focal 99% in-stent restenosis at the previous PTCA site. This was treated with balloon angioplasty on reducing 99% to 20% stenosis. 2. Mild-Moderate ostial and proximal RCA disease prior to the stented segment. Distal RCA stent is patent with roughly 5% ISR. Also noted RPDA lesion, 60% stenosed. 3. Occluded LAD after D1 is diffusely diseased. Widely patent LIMA-LAD. Mid LAD to Dist LAD lesion, 80% stenosed limiting retrograde flow from the LIMA LAD back up to a proximal diagonal branch. 4. Ost Cx to Dist Cx lesion, 100% stenosed. 5. Dist LAD lesion, 40% stenosed. 6. LIMA was injected is large, and is anatomically normal. IMA catheter   Severe recurrent in-stent restenosis of the overlapping stents in the mid RCA requiring complex PCI using guide liner to advance balloons to the lesion. Somewhat successful PTCA only/12 of the in-stent restenosis reducing 99% to 20%. Widely patent SVG-OM and LIMA-LAD. There is moderate  disease in both the OM and LAD beyond the graft segments and more severe disease upstream of the LAD from retrograde flow.  Echo 02/23/13:  - Left ventricle: There is false tendon in the apex. There is paradoxical septal motion. The cavity size was normal. There was moderate concentric hypertrophy. Systolic function was mildly reduced. The estimated ejectionfraction was in the range of 45% to 50%. Features areconsistent with a pseudonormal left ventricular fillingpattern, with concomitant abnormal relaxation andincreased filling pressure (grade 2 diastolicdysfunction). Doppler parameters are consistent with severely elevated ventricular end-diastolic fillingpressure. - Ventricular septum: Septal motion showed paradox. - Aortic valve: Mild regurgitation. - Mitral valve: Mild to moderate regurgitation. - Left atrium: The atrium was moderately dilated. - Right ventricle: Systolic function was normal. - Tricuspid valve: Mild regurgitation.  If labs acceptable DOS, I anticipate pt can proceed as scheduled.   Willeen Cass, FNP-BC [facility]/Anesthesiology Phone: [phone] 10/27/2015 1:36 PM

## 2015-10-28 ENCOUNTER — Ambulatory Visit (HOSPITAL_COMMUNITY)
Admission: RE | Admit: 2015-10-28 | Discharge: 2015-10-28 | Disposition: A | Discharge status: Home / Self Care | Payer: Medicare Other | Source: Ambulatory Visit | Attending: Vascular Surgery | Admitting: Vascular Surgery

## 2015-10-28 ENCOUNTER — Ambulatory Visit (HOSPITAL_COMMUNITY): Payer: Medicare Other | Admitting: Emergency Medicine

## 2015-10-28 ENCOUNTER — Encounter (HOSPITAL_COMMUNITY): Payer: Self-pay | Admitting: *Deleted

## 2015-10-28 ENCOUNTER — Encounter (HOSPITAL_COMMUNITY)
Admission: RE | Disposition: A | Discharge status: Home / Self Care | Payer: Self-pay | Source: Ambulatory Visit | Attending: Vascular Surgery

## 2015-10-28 DIAGNOSIS — Z888 Allergy status to other drugs, medicaments and biological substances status: Secondary | ICD-10-CM | POA: Insufficient documentation

## 2015-10-28 DIAGNOSIS — E785 Hyperlipidemia, unspecified: Secondary | ICD-10-CM | POA: Insufficient documentation

## 2015-10-28 DIAGNOSIS — Z9889 Other specified postprocedural states: Secondary | ICD-10-CM | POA: Diagnosis not present

## 2015-10-28 DIAGNOSIS — Z8711 Personal history of peptic ulcer disease: Secondary | ICD-10-CM | POA: Insufficient documentation

## 2015-10-28 DIAGNOSIS — Z85828 Personal history of other malignant neoplasm of skin: Secondary | ICD-10-CM | POA: Diagnosis not present

## 2015-10-28 DIAGNOSIS — Z7902 Long term (current) use of antithrombotics/antiplatelets: Secondary | ICD-10-CM | POA: Diagnosis not present

## 2015-10-28 DIAGNOSIS — G8929 Other chronic pain: Secondary | ICD-10-CM | POA: Insufficient documentation

## 2015-10-28 DIAGNOSIS — I25810 Atherosclerosis of coronary artery bypass graft(s) without angina pectoris: Secondary | ICD-10-CM | POA: Insufficient documentation

## 2015-10-28 DIAGNOSIS — Z955 Presence of coronary angioplasty implant and graft: Secondary | ICD-10-CM | POA: Diagnosis not present

## 2015-10-28 DIAGNOSIS — Z91013 Allergy to seafood: Secondary | ICD-10-CM | POA: Insufficient documentation

## 2015-10-28 DIAGNOSIS — I509 Heart failure, unspecified: Secondary | ICD-10-CM | POA: Insufficient documentation

## 2015-10-28 DIAGNOSIS — Z85038 Personal history of other malignant neoplasm of large intestine: Secondary | ICD-10-CM | POA: Diagnosis not present

## 2015-10-28 DIAGNOSIS — M545 Low back pain: Secondary | ICD-10-CM | POA: Diagnosis not present

## 2015-10-28 DIAGNOSIS — E1122 Type 2 diabetes mellitus with diabetic chronic kidney disease: Secondary | ICD-10-CM | POA: Diagnosis not present

## 2015-10-28 DIAGNOSIS — N186 End stage renal disease: Secondary | ICD-10-CM | POA: Insufficient documentation

## 2015-10-28 DIAGNOSIS — T82898A Other specified complication of vascular prosthetic devices, implants and grafts, initial encounter: Secondary | ICD-10-CM | POA: Diagnosis not present

## 2015-10-28 DIAGNOSIS — I252 Old myocardial infarction: Secondary | ICD-10-CM | POA: Diagnosis not present

## 2015-10-28 DIAGNOSIS — I130 Hypertensive heart and chronic kidney disease with heart failure and stage 1 through stage 4 chronic kidney disease, or unspecified chronic kidney disease: Secondary | ICD-10-CM | POA: Insufficient documentation

## 2015-10-28 DIAGNOSIS — E039 Hypothyroidism, unspecified: Secondary | ICD-10-CM | POA: Insufficient documentation

## 2015-10-28 DIAGNOSIS — Z833 Family history of diabetes mellitus: Secondary | ICD-10-CM | POA: Insufficient documentation

## 2015-10-28 DIAGNOSIS — I120 Hypertensive chronic kidney disease with stage 5 chronic kidney disease or end stage renal disease: Secondary | ICD-10-CM | POA: Diagnosis not present

## 2015-10-28 DIAGNOSIS — Y828 Other medical devices associated with adverse incidents: Secondary | ICD-10-CM | POA: Insufficient documentation

## 2015-10-28 DIAGNOSIS — Z79899 Other long term (current) drug therapy: Secondary | ICD-10-CM | POA: Insufficient documentation

## 2015-10-28 DIAGNOSIS — Z8249 Family history of ischemic heart disease and other diseases of the circulatory system: Secondary | ICD-10-CM | POA: Insufficient documentation

## 2015-10-28 DIAGNOSIS — Z794 Long term (current) use of insulin: Secondary | ICD-10-CM | POA: Diagnosis not present

## 2015-10-28 DIAGNOSIS — Z823 Family history of stroke: Secondary | ICD-10-CM | POA: Insufficient documentation

## 2015-10-28 DIAGNOSIS — I447 Left bundle-branch block, unspecified: Secondary | ICD-10-CM | POA: Diagnosis not present

## 2015-10-28 DIAGNOSIS — I2510 Atherosclerotic heart disease of native coronary artery without angina pectoris: Secondary | ICD-10-CM | POA: Diagnosis not present

## 2015-10-28 DIAGNOSIS — I255 Ischemic cardiomyopathy: Secondary | ICD-10-CM | POA: Diagnosis not present

## 2015-10-28 DIAGNOSIS — Z8673 Personal history of transient ischemic attack (TIA), and cerebral infarction without residual deficits: Secondary | ICD-10-CM | POA: Insufficient documentation

## 2015-10-28 DIAGNOSIS — I729 Aneurysm of unspecified site: Secondary | ICD-10-CM | POA: Diagnosis not present

## 2015-10-28 DIAGNOSIS — Z992 Dependence on renal dialysis: Secondary | ICD-10-CM | POA: Diagnosis not present

## 2015-10-28 DIAGNOSIS — N400 Enlarged prostate without lower urinary tract symptoms: Secondary | ICD-10-CM | POA: Diagnosis not present

## 2015-10-28 DIAGNOSIS — M109 Gout, unspecified: Secondary | ICD-10-CM | POA: Diagnosis not present

## 2015-10-28 HISTORY — DX: Angina pectoris, unspecified: I20.9

## 2015-10-28 HISTORY — DX: Pneumonia, unspecified organism: J18.9

## 2015-10-28 HISTORY — PX: REVISON OF ARTERIOVENOUS FISTULA: SHX6074

## 2015-10-28 HISTORY — DX: Dyspnea, unspecified: R06.00

## 2015-10-28 HISTORY — DX: Cardiac murmur, unspecified: R01.1

## 2015-10-28 HISTORY — DX: Anxiety disorder, unspecified: F41.9

## 2015-10-28 HISTORY — DX: Unspecified osteoarthritis, unspecified site: M19.90

## 2015-10-28 LAB — GLUCOSE, CAPILLARY
GLUCOSE-CAPILLARY: 124 mg/dL — AB (ref 65–99)
GLUCOSE-CAPILLARY: 107 mg/dL — AB (ref 65–99)

## 2015-10-28 LAB — POCT I-STAT 4, (NA,K, GLUC, HGB,HCT)
Glucose, Bld: 100 mg/dL — ABNORMAL HIGH (ref 65–99)
HEMATOCRIT: 38 % — AB (ref 39.0–52.0)
Hemoglobin: 12.9 g/dL — ABNORMAL LOW (ref 13.0–17.0)
Potassium: 4.8 mmol/L (ref 3.5–5.1)
SODIUM: 136 mmol/L (ref 135–145)

## 2015-10-28 SURGERY — REVISON OF ARTERIOVENOUS FISTULA
Anesthesia: Monitor Anesthesia Care | Site: Arm Upper | Laterality: Left

## 2015-10-28 MED ORDER — CHLORHEXIDINE GLUCONATE CLOTH 2 % EX PADS: 6.0000 | MEDICATED_PAD | Freq: Once | CUTANEOUS | Status: DC

## 2015-10-28 MED ORDER — FENTANYL CITRATE (PF) 100 MCG/2ML IJ SOLN
25.0000 ug | INTRAMUSCULAR | Status: DC | PRN
  Administered 2015-10-28: 50 ug via INTRAVENOUS

## 2015-10-28 MED ORDER — SODIUM CHLORIDE 0.9 % IV SOLN
INTRAVENOUS | Status: DC | PRN
  Administered 2015-10-28: 09:00:00

## 2015-10-28 MED ORDER — SODIUM CHLORIDE 0.9 % IR SOLN
Status: DC | PRN
  Administered 2015-10-28: 1000 mL

## 2015-10-28 MED ORDER — DEXTROSE 5 % IV SOLN: 1.5000 g | INTRAVENOUS | Status: DC

## 2015-10-28 MED ORDER — PHENYLEPHRINE HCL 10 MG/ML IJ SOLN
INTRAMUSCULAR | Status: DC | PRN
  Administered 2015-10-28: 80 ug via INTRAVENOUS

## 2015-10-28 MED ORDER — MIDAZOLAM HCL 2 MG/2ML IJ SOLN
INTRAMUSCULAR | Status: AC
  Filled 2015-10-28: qty 2

## 2015-10-28 MED ORDER — HEPARIN SODIUM (PORCINE) 1000 UNIT/ML IJ SOLN
INTRAMUSCULAR | Status: DC | PRN
  Administered 2015-10-28: 5000 [IU] via INTRAVENOUS

## 2015-10-28 MED ORDER — ONDANSETRON HCL 4 MG/2ML IJ SOLN: 4.0000 mg | Freq: Once | INTRAMUSCULAR | Status: DC | PRN

## 2015-10-28 MED ORDER — OXYCODONE-ACETAMINOPHEN 5-325 MG PO TABS: 1.0000 | ORAL_TABLET | Freq: Four times a day (QID) | ORAL | 0 refills | Status: DC | PRN

## 2015-10-28 MED ORDER — LIDOCAINE HCL (CARDIAC) 20 MG/ML IV SOLN
INTRAVENOUS | Status: DC | PRN
  Administered 2015-10-28: 30 mg via INTRAVENOUS

## 2015-10-28 MED ORDER — FENTANYL CITRATE (PF) 100 MCG/2ML IJ SOLN
INTRAMUSCULAR | Status: AC
  Filled 2015-10-28: qty 2

## 2015-10-28 MED ORDER — LIDOCAINE-EPINEPHRINE (PF) 1 %-1:200000 IJ SOLN
INTRAMUSCULAR | Status: AC
Start: 2015-10-28 — End: 2015-10-28
  Filled 2015-10-28: qty 30

## 2015-10-28 MED ORDER — SODIUM CHLORIDE 0.9 % IV SOLN
INTRAVENOUS | Status: DC
  Administered 2015-10-28 (×2): via INTRAVENOUS

## 2015-10-28 MED ORDER — LIDOCAINE-EPINEPHRINE 0.5 %-1:200000 IJ SOLN
INTRAMUSCULAR | Status: DC | PRN
  Administered 2015-10-28: 30 mL

## 2015-10-28 MED ORDER — FENTANYL CITRATE (PF) 100 MCG/2ML IJ SOLN
INTRAMUSCULAR | Status: DC | PRN
  Administered 2015-10-28: 50 ug via INTRAVENOUS
  Administered 2015-10-28 (×2): 25 ug via INTRAVENOUS

## 2015-10-28 MED ORDER — PROPOFOL 500 MG/50ML IV EMUL
INTRAVENOUS | Status: DC | PRN
  Administered 2015-10-28: 75 ug/kg/min via INTRAVENOUS

## 2015-10-28 SURGICAL SUPPLY — 39 items
APL SKNCLS STERI-STRIP NONHPOA (GAUZE/BANDAGES/DRESSINGS) ×1
ARMBAND PINK RESTRICT EXTREMIT (MISCELLANEOUS) ×3 IMPLANT
BENZOIN TINCTURE PRP APPL 2/3 (GAUZE/BANDAGES/DRESSINGS) ×3 IMPLANT
CANISTER SUCTION 2500CC (MISCELLANEOUS) ×3 IMPLANT
CANNULA VESSEL 3MM 2 BLNT TIP (CANNULA) IMPLANT
CLIP LIGATING EXTRA MED SLVR (CLIP) ×3 IMPLANT
CLIP LIGATING EXTRA SM BLUE (MISCELLANEOUS) ×3 IMPLANT
CLOSURE WOUND 1/2 X4 (GAUZE/BANDAGES/DRESSINGS) ×1
COVER PROBE W GEL 5X96 (DRAPES) ×3 IMPLANT
DECANTER SPIKE VIAL GLASS SM (MISCELLANEOUS) ×3 IMPLANT
ELECT REM PT RETURN 9FT ADLT (ELECTROSURGICAL) ×3
ELECTRODE REM PT RTRN 9FT ADLT (ELECTROSURGICAL) ×1 IMPLANT
GAUZE SPONGE 2X2 8PLY STRL LF (GAUZE/BANDAGES/DRESSINGS) IMPLANT
GAUZE SPONGE 4X4 12PLY STRL (GAUZE/BANDAGES/DRESSINGS) ×3 IMPLANT
GEL ULTRASOUND 20GR AQUASONIC (MISCELLANEOUS) IMPLANT
GLOVE BIO SURGEON STRL SZ7 (GLOVE) ×6 IMPLANT
GLOVE BIOGEL PI IND STRL 7.0 (GLOVE) ×1 IMPLANT
GLOVE BIOGEL PI IND STRL 7.5 (GLOVE) ×1 IMPLANT
GLOVE BIOGEL PI INDICATOR 7.0 (GLOVE) ×2
GLOVE BIOGEL PI INDICATOR 7.5 (GLOVE) ×2
GLOVE ECLIPSE 7.0 STRL STRAW (GLOVE) ×2 IMPLANT
GLOVE SS BIOGEL STRL SZ 7.5 (GLOVE) ×1 IMPLANT
GLOVE SUPERSENSE BIOGEL SZ 7.5 (GLOVE) ×2
GOWN STRL REUS W/ TWL LRG LVL3 (GOWN DISPOSABLE) ×3 IMPLANT
GOWN STRL REUS W/TWL LRG LVL3 (GOWN DISPOSABLE) ×9
KIT BASIN OR (CUSTOM PROCEDURE TRAY) ×3 IMPLANT
KIT ROOM TURNOVER OR (KITS) ×3 IMPLANT
NS IRRIG 1000ML POUR BTL (IV SOLUTION) ×3 IMPLANT
PACK CV ACCESS (CUSTOM PROCEDURE TRAY) ×3 IMPLANT
PAD ARMBOARD 7.5X6 YLW CONV (MISCELLANEOUS) ×6 IMPLANT
SPONGE GAUZE 2X2 STER 10/PKG (GAUZE/BANDAGES/DRESSINGS) ×2
STRIP CLOSURE SKIN 1/2X4 (GAUZE/BANDAGES/DRESSINGS) ×2 IMPLANT
SUT PROLENE 5 0 C 1 24 (SUTURE) ×2 IMPLANT
SUT PROLENE 6 0 CC (SUTURE) ×3 IMPLANT
SUT VIC AB 3-0 SH 27 (SUTURE) ×3
SUT VIC AB 3-0 SH 27X BRD (SUTURE) ×1 IMPLANT
TAPE CLOTH SURG 4X10 WHT LF (GAUZE/BANDAGES/DRESSINGS) ×2 IMPLANT
UNDERPAD 30X30 (UNDERPADS AND DIAPERS) ×3 IMPLANT
WATER STERILE IRR 1000ML POUR (IV SOLUTION) ×3 IMPLANT

## 2015-10-28 NOTE — H&P (View-Only) (Signed)
Patient name: [name] [name] MRN: [id number] DOB: 08-30-1940 Sex: male  REASON FOR CONSULT: Aneurysm of AV fistula left arm. Referred by Dr. Marval Regal.   HPI: [name] [name] is a 75 y.o. male, who was referred for evaluation of a large aneurysm in his left upper arm fistula. He has had this fistula for some time and has not had any problems with it during dialysis. However he has developed a large aneurysm in the central portion of the fistula. He dialyzes on Tuesdays Thursdays and Saturdays.  He denies paresthesias or pain in the left upper extremity.  Past Medical History:  Diagnosis Date  . Anemia    Likely secondary to history of GI bleed  . BPH (benign prostatic hyperplasia)   . CAD (coronary artery disease) of bypass graft 2006, 2012   In 2006: Occluded SVG-OM noted (SVG-diagonal was previously occluded); 2012: Severe lesion in redo SVG-OM1 --> BMS PCI   . CAD in native artery; and the grafts 1992, 1997, 2002, 2006, 2012   CABG 1992 and redo CABG 2006  . CAD S/P percutaneous coronary angioplasty October 2012; Feb, Apr & Oct 2015; Feb 2016   a) s/p CABG 1992 and redo 2006. b) 10/12: NSTEMI: PCI to SVG-OM1 Integrity BMS 3.5 x 15 (3.85 mm) c) 2/15: UA - PCI mid RCA Xience Ap DES 2.75 x 15 (3.1 mm). d) 4/15 : UA - focal dRCA Resolute DES 2.25 x 8 (2.5 mm). e) 10/15: PCI to mRCA ISR w/ post stent lesion - Promus P 2.75 x 16 (3.25 mm). f) 2/16: NSTEMI: CBA/PTCA of  mRCA ISR (3.5 mm post-dilation); g) 7/16 ISR RCA->CBA/PTCA, residual 20%.  . Carotid arterial disease ([place])    a) Duplex 05/2012: R BULB/PROX ICA: 50-69%, L BULB/PROX ICA: 0-49%. ;; 04/29/2014: 38-18% RICA, 29-93% LICA. Patetn vertebrals,  . Chronic low back pain   . Colon cancer [facility]) 2003   colectomy for CA. no recurrence . never required  radiation or chem  . Diabetes mellitus   . End stage renal disease on dialysis [facility])    Dialyzes at [facility]: Tues/Th/Sat - left upper cavity AV fistula brachiocephalic  . Gout     . History of Non-ST elevated myocardial infarction (non-STEMI) 1992, 2006, 2012; 02/2013  . Hyperlipidemia   . Hypertension   . Hypothyroidism    On supplementation  . Ischemic cardiomyopathy    a) EF 45-50% by echo 2015. b) EF 50% by cath 04/2014.  Marland Kitchen LBBB (left bundle branch block)    Chronic  . Peptic ulcer disease  2004  . Plavix resistance    a) P2Y12 264 in 02/2014 - put on Brilinta but did not tolerate due to SOB. b) cannot be on Effient due to history of stroke. c) Plavix increased to 75mg  BID and Pletal added 04/2014 due to ISR.  Marland Kitchen Skin cancer   . Stroke [facility]) 1998    Family History  Problem Relation Age of Onset  . Heart disease Mother   . Hypertension Mother   . Diabetes Mother   . Heart attack Mother   . Heart disease Father   . Hypertension Father   . Diabetes Father   . Heart attack Father   . Stroke Paternal Aunt   . Stroke Paternal Uncle     SOCIAL HISTORY: Social History   Social History  . Marital status: Married    Spouse name: N/A  . Number of children: 3  . Years of education: N/A  Occupational History  . Not on file.   Social History Main Topics  . Smoking status: Never Smoker  . Smokeless tobacco: Never Used  . Alcohol use No  . Drug use: No  . Sexual activity: No   Other Topics Concern  . Not on file   Social History Narrative   Long-term patient of Dr. Rollene Fare.   Married father of 51, grandfather 10.   Never smoked.   Not exercising D2 hip and back pain & now Angina    Allergies  Allergen Reactions  . Brilinta [Ticagrelor] Shortness Of Breath  . Shellfish Allergy Other (See Comments)    Causes gout flare-ups    Current Outpatient Prescriptions  Medication Sig Dispense Refill  . amLODipine (NORVASC) 5 MG tablet Take one tablet on non dialysis day  By mouth. 90 tablet 3  . atorvastatin (LIPITOR) 20 MG tablet Take 1 tablet (20 mg total) by mouth daily. 90 tablet 3  . Besifloxacin HCl (BESIVANCE) 0.6 % SUSP Place 1 drop into  both eyes See admin instructions. Use eye drops 4 times daily for 2 days following injection by Dr. Zigmund Daniel (every 10 weeks)    . carvedilol (COREG) 25 MG tablet TAKE ONE TABLET BY MOUTH TWICE DAILY WITH MEALS 180 tablet 1  . cilostazol (PLETAL) 100 MG tablet TAKE ONE TABLET BY MOUTH TWICE DAILY 180 tablet 1  . clonazePAM (KLONOPIN) 0.5 MG tablet TAKE ONE-HALF TABLET BY MOUTH DAILY AS NEEDED FOR ANXIETY 30 tablet 0  . clopidogrel (PLAVIX) 75 MG tablet Take 1 tablet (75 mg total) by mouth daily. 90 tablet 3  . glucose blood (TRUE METRIX BLOOD GLUCOSE TEST) test strip 1 each by Other route as needed for other. Use as instructed to test sugars 2-3 times daily. Dx. E11.40 100 each 12  . insulin NPH Human (HUMULIN N,NOVOLIN N) 100 UNIT/ML injection Inject 10-20 Units into the skin 2 (two) times daily as needed (CBG >130). CBG 130-150 inject 10 units, >150 inject 20 units    . isosorbide mononitrate (IMDUR) 120 MG 24 hr tablet Take 1 tablet (120 mg total) by mouth daily. 90 tablet 3  . levothyroxine (SYNTHROID, LEVOTHROID) 125 MCG tablet TAKE ONE TABLET BY MOUTH ONCE DAILY BEFORE BREAKFAST 90 tablet 1  . lidocaine-prilocaine (EMLA) cream Apply 1 application topically See admin instructions. Apply prior to dialysis treatments on Tuesday, Thursday and Saturday    . loperamide (IMODIUM A-D) 2 MG tablet Take 4 mg by mouth 2 (two) times daily as needed for diarrhea or loose stools.    Marland Kitchen NITROSTAT 0.4 MG SL tablet Place 1 tablet (0.4 mg total) under the tongue every 5 (five) minutes as needed for chest pain. Max 3 doses. 25 tablet 3  . sevelamer carbonate (RENVELA) 800 MG tablet Take 1,600-2,400 mg by mouth See admin instructions. Take 3 tablets (2400 mg) with meals and 2 tablets (1600 mg) with snacks     No current facility-administered medications for this visit.     REVIEW OF SYSTEMS:  [X]  denotes positive finding, [ ]  denotes negative finding Cardiac  Comments:  Chest pain or chest pressure: X    Shortness of breath upon exertion: X   Short of breath when lying flat: X   Irregular heart rhythm:        Vascular    Pain in calf, thigh, or hip brought on by ambulation:    Pain in feet at night that wakes you up from your sleep:     Blood  clot in your veins:    Leg swelling:         Pulmonary    Oxygen at home:    Productive cough:     Wheezing:         Neurologic    Sudden weakness in arms or legs:     Sudden numbness in arms or legs:     Sudden onset of difficulty speaking or slurred speech:    Temporary loss of vision in one eye:     Problems with dizziness:         Gastrointestinal    Blood in stool:     Vomited blood:         Genitourinary    Burning when urinating:     Blood in urine:        Psychiatric    Major depression:         Hematologic    Bleeding problems:    Problems with blood clotting too easily:        Skin    Rashes or ulcers:        Constitutional    Fever or chills:      PHYSICAL EXAM: Vitals:   10/12/15 1552  BP: 124/80  Pulse: 62  Resp: 16  Temp: 98.6 F (37 C)  TempSrc: Oral  SpO2: 95%  Weight: 148 lb (67.1 kg)  Height: 5\' 4"  (1.626 m)    GENERAL: The patient is a well-nourished male, in no acute distress. The vital signs are documented above. CARDIAC: There is a regular rate and rhythm.  VASCULAR: He has a good thrill in his left upper arm fistula. There is a large aneurysm in the central portion of the fistula. He has a palpable left radial pulse. PULMONARY: There is good air exchange bilaterally without wheezing or rales. NEUROLOGIC: No focal weakness or paresthesias are detected. SKIN: There are no ulcers or rashes noted. PSYCHIATRIC: The patient has a normal affect.  DATA:   DUPLEX OF LEFT UPPER ARM FISTULA: No significant narrowing is noted in his left upper arm fistula. He has the large aneurysmal area as noted.  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 Hopefully we will be able to plicate this but more than likely we would have to place an interposition graft in this area. I think he still will have enough area to cannulate the fistula above and below the revision. This has been scheduled for 10/28/2015 which is a nondialysis day. I have discussed the procedure with potential complications with the patient and he is agreeable to proceed.   Deitra Mayo [facility] [place] [facility] [phone]

## 2015-10-28 NOTE — Transfer of Care (Signed)
Immediate Anesthesia Transfer of Care Note  Patient: [name] [name]  Procedure(s) Performed: Procedure(s): PLICATION OF LEFT UPPER ARM  ARTERIOVENOUS FISTULA  ANEURYSM (Left)  Patient Location: PACU  Anesthesia Type:MAC  Level of Consciousness: awake, alert  and oriented  Airway & Oxygen Therapy: Patient Spontanous Breathing and Patient connected to nasal cannula oxygen  Post-op Assessment: Report given to RN and Post -op Vital signs reviewed and stable  Post vital signs: Reviewed and stable  Last Vitals:  Vitals:   10/28/15 0801  BP: (!) 120/56  Pulse: 65  Resp: 18  Temp: 36.7 C    Last Pain:  Vitals:   10/28/15 0801  TempSrc: Oral         Complications: No apparent anesthesia complications

## 2015-10-28 NOTE — Anesthesia Postprocedure Evaluation (Signed)
Anesthesia Post Note  Patient: [name] [name]  Procedure(s) Performed: Procedure(s) (LRB): PLICATION OF LEFT UPPER ARM  ARTERIOVENOUS FISTULA  ANEURYSM (Left)  Patient location during evaluation: PACU Anesthesia Type: MAC Level of consciousness: awake and alert Pain management: pain level controlled Vital Signs Assessment: post-procedure vital signs reviewed and stable Respiratory status: spontaneous breathing, nonlabored ventilation, respiratory function stable and patient connected to nasal cannula oxygen Cardiovascular status: stable and blood pressure returned to baseline Anesthetic complications: no    Last Vitals:  Vitals:   10/28/15 1034 10/28/15 1049  BP: (!) 104/51 (!) 100/56  Pulse: 64 63  Resp: 16 13  Temp: 36.4 C     Last Pain:  Vitals:   10/28/15 1050  TempSrc:   PainSc: 5                  Zenaida Deed

## 2015-10-28 NOTE — Interval H&P Note (Signed)
History and Physical Interval Note:  10/28/2015 8:57 AM  [name] [name]  has presented today for surgery, with the diagnosis of End Stage Renal Disease N18.6; Left arm arteriovenous fistula aneurysm I72.9  The various methods of treatment have been discussed with the patient and family. After consideration of risks, benefits and other options for treatment, the patient has consented to  Procedure(s): EXCISION OF ARTERIOVENOUS FISTULA ANEURYSM (Left) as a surgical intervention .  The patient's history has been reviewed, patient examined, no change in status, stable for surgery.  I have reviewed the patient's chart and labs.  Questions were answered to the patient's satisfaction.     Curt Jews

## 2015-10-28 NOTE — Anesthesia Preprocedure Evaluation (Addendum)
Anesthesia Evaluation  Patient identified by MRN, date of birth, ID band Patient awake    Reviewed: Allergy & Precautions, H&P , NPO status , Patient's Chart, lab work & pertinent test results  Airway Mallampati: II   Neck ROM: full  Mouth opening: Limited Mouth Opening  Dental  (+) Teeth Intact, Dental Advidsory Given   Pulmonary    breath sounds clear to auscultation       Cardiovascular hypertension, On Home Beta Blockers + CAD, + Past MI, + CABG, + Peripheral Vascular Disease and +CHF  + dysrhythmias + Valvular Problems/Murmurs MR  Rhythm:Regular Rate:Normal  Echo 2016: EF is 41%, RV systolic fx preserved, mild-mod MR  Last PTCA done in 2016 in cath lab, has extensive CAD with complex stent history, will remain on plavix even through procedures given risk of instent restenosis   Neuro/Psych Anxiety  Neuromuscular disease CVA, No Residual Symptoms    GI/Hepatic   Endo/Other  diabetes, Well Controlled, Type 1Hypothyroidism   Renal/GU ESRF and DialysisRenal disease     Musculoskeletal  (+) Arthritis ,   Abdominal   Peds  Hematology   Anesthesia Other Findings   Reproductive/Obstetrics                            Anesthesia Physical  Anesthesia Plan  ASA: III  Anesthesia Plan: MAC   Post-op Pain Management:    Induction: Intravenous  Airway Management Planned:   Additional Equipment:   Intra-op Plan:   Post-operative Plan:   Informed Consent: I have reviewed the patients History and Physical, chart, labs and discussed the procedure including the risks, benefits and alternatives for the proposed anesthesia with the patient or authorized representative who has indicated his/her understanding and acceptance.   Dental advisory given  Plan Discussed with: Anesthesiologist and CRNA  Anesthesia Plan Comments:         Anesthesia Quick Evaluation

## 2015-10-28 NOTE — Anesthesia Procedure Notes (Signed)
Procedure Name: MAC Date/Time: 10/28/2015 9:25 AM Performed by: Eligha Bridegroom Pre-anesthesia Checklist: Patient identified, Emergency Drugs available, Suction available, Patient being monitored and Timeout performed Patient Re-evaluated:Patient Re-evaluated prior to inductionOxygen Delivery Method: Nasal cannula Preoxygenation: Pre-oxygenation with 100% oxygen Intubation Type: IV induction

## 2015-10-28 NOTE — Op Note (Signed)
    OPERATIVE REPORT  DATE OF SURGERY: 10/28/2015  PATIENT: [name] [name], 75 y.o. 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  PHYSICIAN ASSISTANT: Collins  ANESTHESIA:  Local with sedation  EBL: Minimal ml  Total I/O In: 450 [I.V.:450] Out: 0   BLOOD ADMINISTERED: None  DRAINS: None  SPECIMEN: None  COUNTS CORRECT:  YES  PLAN OF CARE: PACU   PATIENT DISPOSITION:  PACU - hemodynamically stable  PROCEDURE DETAILS: The patient was taken to the operating placed supine position where the area of the abdomen was prepped and draped in usual sterile fashion. The patient had a brachiocephalic AV fistula. The mid to distal portion of this had a large aneurysm. The remaining portion of the fistula was appropriately dilated. Decision made to treat this with either interposition grafting or plication. Using local anesthesia an incision was made on the medial aspect of this aneurysm. The aneurysmal segment was mobilized circumferentially and the normal segment of the fistula was also exposed proximal and distal to this. There was not adequate room for resecting this and an anastomosis. It was felt best option would be to plicate this aneurysm to continue to have no prosthetic material present. A 5-0 Prolene suture was used in 2 layers to leave approximately 8 mm lumen by plicating the remaining portion of the aneurysm. The patient was given 5000 units of intravenous heparin prior to occlusion of the aneurysm. After the plication the clamps removed and there was a good thrill present good flow throughout the fistula. This did appear to be the appropriate diameter compared to the vein above and below the former aneurysm. Wound was irrigated with saline and hemostasis tablet  cautery. The wound was closed with several layers of Vicryl in the subcutaneous pain used tissue and 3-0 Vicryl in the subcuticular tissue. Benzoin and Steri-Strips were applied. Sterile dressing was applied the patient was transferred to the recovery room in stable condition    Rosetta Posner, M.D., [facility] 10/28/2015 10:56 AM

## 2015-10-28 NOTE — Addendum Note (Signed)
Addendum  created 10/28/15 1920 by Eligha Bridegroom, CRNA   Anesthesia Event edited

## 2015-10-29 ENCOUNTER — Encounter (HOSPITAL_COMMUNITY): Payer: Self-pay | Admitting: Vascular Surgery

## 2015-10-30 ENCOUNTER — Other Ambulatory Visit: Payer: Self-pay | Admitting: Family Medicine

## 2015-11-02 ENCOUNTER — Encounter (HOSPITAL_COMMUNITY): Payer: Self-pay | Admitting: Vascular Surgery

## 2015-11-02 NOTE — Addendum Note (Signed)
Addendum  created 11/02/15 1429 by Josephine Igo, CRNA   Anesthesia Event edited

## 2015-11-05 DIAGNOSIS — E1129 Type 2 diabetes mellitus with other diabetic kidney complication: Secondary | ICD-10-CM | POA: Diagnosis not present

## 2015-11-08 DIAGNOSIS — Z992 Dependence on renal dialysis: Secondary | ICD-10-CM | POA: Diagnosis not present

## 2015-11-08 DIAGNOSIS — E1129 Type 2 diabetes mellitus with other diabetic kidney complication: Secondary | ICD-10-CM | POA: Diagnosis not present

## 2015-11-08 DIAGNOSIS — N186 End stage renal disease: Secondary | ICD-10-CM | POA: Diagnosis not present

## 2015-11-10 DIAGNOSIS — E876 Hypokalemia: Secondary | ICD-10-CM | POA: Diagnosis not present

## 2015-11-10 DIAGNOSIS — N2581 Secondary hyperparathyroidism of renal origin: Secondary | ICD-10-CM | POA: Diagnosis not present

## 2015-11-10 DIAGNOSIS — N186 End stage renal disease: Secondary | ICD-10-CM | POA: Diagnosis not present

## 2015-11-10 DIAGNOSIS — D509 Iron deficiency anemia, unspecified: Secondary | ICD-10-CM | POA: Diagnosis not present

## 2015-11-10 DIAGNOSIS — E1129 Type 2 diabetes mellitus with other diabetic kidney complication: Secondary | ICD-10-CM | POA: Diagnosis not present

## 2015-11-10 DIAGNOSIS — D631 Anemia in chronic kidney disease: Secondary | ICD-10-CM | POA: Diagnosis not present

## 2015-11-19 DIAGNOSIS — N186 End stage renal disease: Secondary | ICD-10-CM | POA: Diagnosis not present

## 2015-11-21 DIAGNOSIS — R3912 Poor urinary stream: Secondary | ICD-10-CM | POA: Diagnosis not present

## 2015-11-21 DIAGNOSIS — N401 Enlarged prostate with lower urinary tract symptoms: Secondary | ICD-10-CM | POA: Diagnosis not present

## 2015-12-08 DIAGNOSIS — E1129 Type 2 diabetes mellitus with other diabetic kidney complication: Secondary | ICD-10-CM | POA: Diagnosis not present

## 2015-12-08 DIAGNOSIS — N186 End stage renal disease: Secondary | ICD-10-CM | POA: Diagnosis not present

## 2015-12-08 DIAGNOSIS — Z992 Dependence on renal dialysis: Secondary | ICD-10-CM | POA: Diagnosis not present

## 2015-12-10 DIAGNOSIS — E876 Hypokalemia: Secondary | ICD-10-CM | POA: Diagnosis not present

## 2015-12-10 DIAGNOSIS — D631 Anemia in chronic kidney disease: Secondary | ICD-10-CM | POA: Diagnosis not present

## 2015-12-10 DIAGNOSIS — N186 End stage renal disease: Secondary | ICD-10-CM | POA: Diagnosis not present

## 2015-12-10 DIAGNOSIS — N2581 Secondary hyperparathyroidism of renal origin: Secondary | ICD-10-CM | POA: Diagnosis not present

## 2015-12-10 DIAGNOSIS — E1129 Type 2 diabetes mellitus with other diabetic kidney complication: Secondary | ICD-10-CM | POA: Diagnosis not present

## 2015-12-10 DIAGNOSIS — D509 Iron deficiency anemia, unspecified: Secondary | ICD-10-CM | POA: Diagnosis not present

## 2015-12-20 ENCOUNTER — Other Ambulatory Visit: Payer: Self-pay | Admitting: Cardiovascular Disease

## 2015-12-20 DIAGNOSIS — I6523 Occlusion and stenosis of bilateral carotid arteries: Secondary | ICD-10-CM

## 2015-12-21 ENCOUNTER — Encounter: Payer: Self-pay | Admitting: Family Medicine

## 2015-12-21 ENCOUNTER — Ambulatory Visit (INDEPENDENT_AMBULATORY_CARE_PROVIDER_SITE_OTHER): Payer: Medicare Other | Admitting: Family Medicine

## 2015-12-21 VITALS — BP 110/67 | HR 59 | Temp 97.9°F | Resp 16 | Ht 64.0 in | Wt 144.4 lb

## 2015-12-21 DIAGNOSIS — I209 Angina pectoris, unspecified: Secondary | ICD-10-CM | POA: Diagnosis not present

## 2015-12-21 DIAGNOSIS — E0840 Diabetes mellitus due to underlying condition with diabetic neuropathy, unspecified: Secondary | ICD-10-CM | POA: Diagnosis not present

## 2015-12-21 LAB — BASIC METABOLIC PANEL
BUN: 25 mg/dL — ABNORMAL HIGH (ref 6–23)
CALCIUM: 9.9 mg/dL (ref 8.4–10.5)
CO2: 32 mEq/L (ref 19–32)
CREATININE: 4.64 mg/dL — AB (ref 0.40–1.50)
Chloride: 100 mEq/L (ref 96–112)
GFR: 13.19 mL/min — AB (ref >60.00)
Glucose, Bld: 131 mg/dL — ABNORMAL HIGH (ref 70–99)
Potassium: 4.4 mEq/L (ref 3.5–5.1)
SODIUM: 139 meq/L (ref 135–145)

## 2015-12-21 LAB — HEMOGLOBIN A1C: HEMOGLOBIN A1C: 5.3 % (ref 4.6–6.5)

## 2015-12-21 NOTE — Patient Instructions (Signed)
Schedule your complete physical after Aug 2nd [facility] notify you of your lab results and make any changes if needed Keep up the good work- you look great! Call with any questions or concerns Happy Holidays!!!

## 2015-12-21 NOTE — Assessment & Plan Note (Signed)
Chronic problem.  No longer requiring medication.  UTD on eye exam, foot exam done today.  No need for microalbumin due to HD.  Check labs.  Adjust meds prn

## 2015-12-21 NOTE — Progress Notes (Signed)
Pre visit review using our clinic review tool, if applicable. No additional management support is needed unless otherwise documented below in the visit note. 

## 2015-12-21 NOTE — Progress Notes (Signed)
   Subjective:    Patient ID: [name] [name], male    DOB: 12-12-1940, 75 y.o.   MRN: [id number]  HPI DM- chronic problem, previously on SSI but no longer requiring meds.  Last A1C 5.3  UTD on eye exam (+ retinopathy), due for foot exam.  No need for microablumin due to HD.  Pt has lost 4 lbs since last visit.  BP is well controlled today but he reports this is labile.  Continues to have CP (cardiology is aware), SOB.  Denies HAs, visual changes, abd pain (but continues to have diarrhea).  Continues to have numbness/tingling of hands/feet.   Review of Systems For ROS see HPI     Objective:   Physical Exam  Constitutional: He is oriented to person, place, and time. He appears well-developed and well-nourished. No distress.  HENT:  Head: Normocephalic and atraumatic.  Eyes: Conjunctivae and EOM are normal. Pupils are equal, round, and reactive to light.  Neck: Normal range of motion. Neck supple. No thyromegaly present.  Cardiovascular: Normal rate, regular rhythm and intact distal pulses.   Murmur (I-II/VI SEM) heard. Pulmonary/Chest: Effort normal and breath sounds normal. No respiratory distress.  Abdominal: Soft. Bowel sounds are normal. He exhibits no distension.  Musculoskeletal: He exhibits no edema.  Lymphadenopathy:    He has no cervical adenopathy.  Neurological: He is alert and oriented to person, place, and time. No cranial nerve deficit.  Skin: Skin is warm and dry.  Psychiatric: He has a normal mood and affect. His behavior is normal.  Vitals reviewed.         Assessment & Plan:

## 2015-12-28 ENCOUNTER — Ambulatory Visit (INDEPENDENT_AMBULATORY_CARE_PROVIDER_SITE_OTHER): Payer: Medicare Other | Admitting: Cardiology

## 2015-12-28 ENCOUNTER — Encounter: Payer: Self-pay | Admitting: Cardiology

## 2015-12-28 ENCOUNTER — Ambulatory Visit (HOSPITAL_COMMUNITY)
Admission: RE | Admit: 2015-12-28 | Discharge: 2015-12-28 | Disposition: A | Discharge status: Home / Self Care | Payer: Medicare Other | Source: Ambulatory Visit | Attending: Cardiovascular Disease | Admitting: Cardiovascular Disease

## 2015-12-28 VITALS — BP 114/60 | HR 61 | Ht 65.0 in | Wt 144.8 lb

## 2015-12-28 DIAGNOSIS — E785 Hyperlipidemia, unspecified: Secondary | ICD-10-CM

## 2015-12-28 DIAGNOSIS — L309 Dermatitis, unspecified: Secondary | ICD-10-CM | POA: Diagnosis not present

## 2015-12-28 DIAGNOSIS — I10 Essential (primary) hypertension: Secondary | ICD-10-CM

## 2015-12-28 DIAGNOSIS — I2089 Other forms of angina pectoris: Secondary | ICD-10-CM

## 2015-12-28 DIAGNOSIS — I25119 Atherosclerotic heart disease of native coronary artery with unspecified angina pectoris: Secondary | ICD-10-CM

## 2015-12-28 DIAGNOSIS — I779 Disorder of arteries and arterioles, unspecified: Secondary | ICD-10-CM

## 2015-12-28 DIAGNOSIS — D2261 Melanocytic nevi of right upper limb, including shoulder: Secondary | ICD-10-CM | POA: Diagnosis not present

## 2015-12-28 DIAGNOSIS — I209 Angina pectoris, unspecified: Secondary | ICD-10-CM

## 2015-12-28 DIAGNOSIS — I5042 Chronic combined systolic (congestive) and diastolic (congestive) heart failure: Secondary | ICD-10-CM | POA: Diagnosis not present

## 2015-12-28 DIAGNOSIS — Z23 Encounter for immunization: Secondary | ICD-10-CM | POA: Diagnosis not present

## 2015-12-28 DIAGNOSIS — I6523 Occlusion and stenosis of bilateral carotid arteries: Secondary | ICD-10-CM | POA: Insufficient documentation

## 2015-12-28 DIAGNOSIS — I208 Other forms of angina pectoris: Secondary | ICD-10-CM

## 2015-12-28 DIAGNOSIS — D2272 Melanocytic nevi of left lower limb, including hip: Secondary | ICD-10-CM | POA: Diagnosis not present

## 2015-12-28 DIAGNOSIS — L821 Other seborrheic keratosis: Secondary | ICD-10-CM | POA: Diagnosis not present

## 2015-12-28 DIAGNOSIS — Z808 Family history of malignant neoplasm of other organs or systems: Secondary | ICD-10-CM | POA: Diagnosis not present

## 2015-12-28 DIAGNOSIS — I739 Peripheral vascular disease, unspecified: Secondary | ICD-10-CM

## 2015-12-28 DIAGNOSIS — I25708 Atherosclerosis of coronary artery bypass graft(s), unspecified, with other forms of angina pectoris: Secondary | ICD-10-CM

## 2015-12-28 MED ORDER — ATORVASTATIN CALCIUM 20 MG PO TABS: 10.0000 mg | ORAL_TABLET | Freq: Every day | ORAL | 3 refills | Status: DC

## 2015-12-28 NOTE — Assessment & Plan Note (Addendum)
Overall doing well. 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 He is only taking 3-4 doses of nitroglycerin week which is a significant reduction in his previous amount. We added the amlodipine which really seems to have helped. Where now over a year and a half from his last PCI  He is on Plavix, low-dose atorvastatin, carvedilol along with amlodipine, cilostazol and Imdur for antianginal effect

## 2015-12-28 NOTE — Assessment & Plan Note (Signed)
He just had his carotid Dopplers done today. Will follow-up.

## 2015-12-28 NOTE — Assessment & Plan Note (Signed)
Almost more notably angina decubitus, but he still has his chronic exertional angina if he overdoes it which for him as not much. He is however able to get out and do more walking and yard work and he had in the past.  Not a lot more options for anti-anginal medications beyond what he is artery on. With him being on dialysis, Ranexa is unfortunately not a good option and he did not do well with it in the past.

## 2015-12-28 NOTE — Progress Notes (Signed)
PCP: Annye Asa, MD  Clinic Note: Chief Complaint  Patient presents with  . Follow-up    patient reports some chest pain (none today) but uses NTG 1-2 times weekly  . Coronary Artery Disease    HPI: [name] [name] is a 75 y.o. male with a PMH below who presents today for 3 month follow-up for his chronic CAD-CABG-multiple PCIS of the RCA. He has recurrent anginal symptoms with in-stent restenosis in the RCA. The RCA is very difficult to do PCI requiring Guideliner catheter. He has end-stage renal disease on hemodialysis. He is artery had redo CABG, and is not a re-redo Candidate Last catheterization and PTCA to RCA was in July 2016.[name] was last seen in September 2017 - he was still doing relatively well at that time. Had noticed slight increase in his blistering use, but not extensively. They changed his dry weight at dialysis.  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 He is relatively stable as far as his angina goes. Usually has more CP @ night when going to bed -- that is when he uses NTG. Less noticeable when taking Amlodipine (he takes on PM after HD).  Using NTG ~3-4 x per week. Also if he overdoes it with walking.  He tries to keep walking - just not fast. More out & about than he could be -- after HD is tired. Otherwise on good days he is out doing yard work and try to do more activities than he had been before. No PND, orthopnea or edema.  No palpitations, - he does have some orthostatic lightheadedness, dizziness, but no generalized weakness, or syncope/near syncope. No TIA/amaurosis fugax symptoms. No melena, hematochezia, hematuria, or epstaxis. No claudication.  ROS: A comprehensive was performed.  Was noted to be anemic on his recent lab work from dialysis. Has been intermittently on iron. Review of  Systems  Constitutional: Negative for malaise/fatigue (bettter energy).  HENT: Negative for congestion, nosebleeds and sinus pain.   Eyes: Negative.  Negative for blurred vision.  Respiratory: Negative for cough, shortness of breath and wheezing.   Gastrointestinal: Positive for diarrhea (constant loos stools since colectomy). Negative for blood in stool and melena.  Genitourinary: Positive for dysuria. Negative for hematuria.       Seeing Urologist - unable to urinate (painful)  - had UTI. completed Abx  Musculoskeletal: Positive for back pain and joint pain (knees, hips & ankles).  Skin:       S/p skin Ca removal on nose - now healing  Neurological: Positive for dizziness (in AM when wakes up) and headaches. Negative for tingling, focal weakness and weakness.  Psychiatric/Behavioral: Positive for memory loss. Negative for depression. The patient is not nervous/anxious and does not have insomnia (occasionally uses Melatonin).   All other systems reviewed and are negative.   Past Medical History:  Diagnosis Date  . Anemia    Likely secondary to history of GI bleed  . Anginal pain (HCC)    Chronic stable  . Anxiety   . Arthritis   . BPH (benign prostatic hyperplasia)   . CAD (coronary artery disease) of bypass graft 2006, 2012   In 2006: Occluded SVG-OM noted (SVG-diagonal was previously occluded); 2012: Severe lesion in redo SVG-OM1 --> BMS PCI   . CAD in native artery; and the grafts 1992, 1997, 2002, 2006,  2012   CABG 1992 and redo CABG 2006  . CAD S/P percutaneous coronary angioplasty October 2012; Feb, Apr & Oct 2015; Feb 2016   a) s/p CABG 1992 and redo 2006. b) 10/12: NSTEMI: PCI to SVG-OM1 Integrity BMS 3.5 x 15 (3.85 mm) c) 2/15: UA - PCI mid RCA Xience Ap DES 2.75 x 15 (3.1 mm). d) 4/15 : UA - focal dRCA Resolute DES 2.25 x 8 (2.5 mm). e) 10/15: PCI to mRCA ISR w/ post stent lesion - Promus P 2.75 x 16 (3.25 mm). f) 2/16: NSTEMI: CBA/PTCA of  mRCA ISR (3.5 mm post-dilation); g)  7/16 ISR RCA->CBA/PTCA, residual 20%.  . Carotid arterial disease ([place])    a) Duplex 05/2012: R BULB/PROX ICA: 50-69%, L BULB/PROX ICA: 0-49%. ;; 04/29/2014: 27-74% RICA, 12-87% LICA. Patetn vertebrals,  . Chronic low back pain   . Colon cancer [facility]) 2003   colectomy for CA. no recurrence . never required  radiation or chem  . Diabetes mellitus    type -II  . Dyspnea    "when I get too much Fluid"  . End stage renal disease on dialysis [facility])    Dialyzes at [facility]: Tues/Th/Sat - left upper cavity AV fistula brachiocephalic  . Gout   . Heart murmur   . History of Non-ST elevated myocardial infarction (non-STEMI) 1992, 2006, 2012; 02/2013  . Hyperlipidemia   . Hypertension   . Hypothyroidism    On supplementation  . Ischemic cardiomyopathy    a) EF 45-50% by echo 2015. b) EF 50% by cath 04/2014.  Marland Kitchen LBBB (left bundle branch block)    Chronic  . Peptic ulcer disease  2004  . Plavix resistance    a) P2Y12 264 in 02/2014 - put on Brilinta but did not tolerate due to SOB. b) cannot be on Effient due to history of stroke. c) Plavix increased to 75mg  BID and Pletal added 04/2014 due to ISR.  Marland Kitchen Pneumonia   . Skin cancer 02/1999   nose  . Stroke [facility]) 1998    Past Surgical History:  Procedure Laterality Date  . APPENDECTOMY    . AV FISTULA REPAIR Left 05/2009  . BACK SURGERY    . CARDIAC CATHETERIZATION  10/16/2010   patent  LIMA to the LAD , PATENT  svg TO 2nd marginals ,occluded PLA with collaterals and SVG to the OM  had 90% stenosis  was txlge  bare - metal  3.5  Integrity ten  postdilate  36-37  mm    . CARDIAC CATHETERIZATION  03/22/2004   loss of both SVG,severe disease in prox and ostial  circ not amenable to invention. mod diease distal LAD  AFTER BYPASS GRAFT;-CVTS to evaluate   . CARDIAC CATHETERIZATION N/A 08/03/2014   Procedure: Left Heart Cath and Cors/Grafts Angiography;  Surgeon: Leonie Man, MD;  Location: [place] CV LAB;  Service: Cardiovascular;  Laterality:  N/A;  . CARDIAC CATHETERIZATION N/A 08/03/2014   Procedure: Coronary Balloon Angioplasty;  Surgeon: Leonie Man, MD;  Location: [place] CV LAB;  Service: Cardiovascular;: Aggressive Cutting Balloon -  Balloon PTCA of ISR site in mRCA (3 stent layer)  . CAROTID DOPPLER  05/23/2012   ABN CAROTID-- RGT BULB/PROX ICA mild to mod 50-60%;lft bulb/prox mild to mod 0-49%;left subclavian abn waveforms consistent with patients lft arm A/V fistula  . CATARACT EXTRACTION W/ INTRAOCULAR LENS  IMPLANT, BILATERAL Bilateral   . CHOLECYSTECTOMY  2009  . COLON RESECTION  2003   transverse  and proximal descending w/ primar anastomosis  . COLON SURGERY  2003   "cancer"  . COLONOSCOPY    . CORONARY ANGIOPLASTY  04/03/1995   OM and Circ  . CORONARY ANGIOPLASTY  02/01/2000   CIRC  . CORONARY ANGIOPLASTY  02/09/14   Cutting Balloon PTCA of mRCA ISR 99% - 3.5 mm  . CORONARY ARTERY BYPASS GRAFT  08/22/1990   INITIAL:LIMA to LAD, SVG to  Diagonal, SVG to OM  . CORONARY ARTERY BYPASS GRAFT  2006   SVG to OM1,SVG to OM2 with patent LIMA  to LAD and occluded vein  graft to the diagonal  and occluded vein grft to OM from  prior  surgery  . DOPPLER ECHOCARDIOGRAPHY  01/25/2012   EF 50 to 55%  . EVENT MONITOR  01/23/2012-02/06/2012   SINUS ,LBBB,unifocal PVCs  . LEFT AND RIGHT HEART CATHETERIZATION WITH CORONARY/GRAFT ANGIOGRAM N/A 04/20/2014   Procedure: LEFT AND RIGHT HEART CATHETERIZATION WITH Beatrix Fetters;  Surgeon: Sherren Mocha, MD;  Location: [facility] CATH LAB;  Service: Cardiovascular;  Laterality: N/A;  . LEFT HEART CATHETERIZATION WITH CORONARY ANGIOGRAM N/A 02/23/2013   Procedure: LEFT HEART CATHETERIZATION WITH CORONARY ANGIOGRAM;  Surgeon: Lorretta Harp, MD;  Location: [facility] CATH LAB;  Service: Cardiovascular;  Laterality: N/A;  . LEFT HEART CATHETERIZATION WITH CORONARY/GRAFT ANGIOGRAM N/A 04/30/2013   Procedure: LEFT HEART CATHETERIZATION WITH Beatrix Fetters;  Surgeon: Leonie Man, MD;  Location: [facility] CATH LAB;  Service: Cardiovascular;  Laterality: N/A;  . LEFT HEART CATHETERIZATION WITH CORONARY/GRAFT ANGIOGRAM N/A 10/15/2013   Procedure: LEFT HEART CATHETERIZATION WITH Beatrix Fetters;  Surgeon: Leonie Man, MD;  Location: [facility] CATH LAB;  Service: Cardiovascular;  Laterality: N/A;  . LEFT HEART CATHETERIZATION WITH CORONARY/GRAFT ANGIOGRAM N/A 02/09/2014   Procedure: LEFT HEART CATHETERIZATION WITH Beatrix Fetters;  Surgeon: Leonie Man, MD;  Location: [facility] CATH LAB;  Service: Cardiovascular;  Laterality: N/A;  . Lower Extremity Arterial Dopplers  October 2014   Calcified but non-occlusive peripheral arteries. No evidence of stenosis  . NM MYOCAR PERF WALL MOTION  10/12/2011   EF 54%,LV normal ; no signifiant ischemia  . PERCUTANEOUS CORONARY STENT INTERVENTION (PCI-S)  02/23/2013   mid RCA 80% & 70% - Xience 2.75 mm x 15 mm ( 3.86mm) ; LIMA-LAD patent, SVG-OM patent (stent patent); SVG-Diag patent.  Marland Kitchen PERCUTANEOUS CORONARY STENT INTERVENTION (PCI-S)  April 2015   dRCA - Integrity Resolute DES   2.25 mm x 93mm (2.5 mm)  . PERCUTANEOUS CORONARY STENT INTERVENTION (PCI-S)  Oct 15 2013   [place] Angina: mRCA ISR with post-stent stenosis -- Cuting PTCA & PCI Promus Premier DES 2.75 mm x 16 mm (3.25 mm)  . POSTERIOR LUMBAR FUSION    . REVISON OF ARTERIOVENOUS FISTULA Left 88/82/8003   Procedure: PLICATION OF LEFT UPPER ARM  ARTERIOVENOUS FISTULA  ANEURYSM;  Surgeon: Rosetta Posner, MD;  Location: [place];  Service: Vascular;  Laterality: Left;  . TRANSTHORACIC ECHOCARDIOGRAM  02/23/2013   Moderate concentric hypertrophy. EF 4500%. Septal bounce. Grade 2 diastolic dysfunction (pseudo-normal - severely elevated filling pressures) mild to moderate MR and moderate LA dilation to    Current Meds  Medication Sig  . acetaminophen (TYLENOL) 500 MG tablet Take 1,000 mg by mouth every 6 (six) hours as needed for mild pain.  Marland Kitchen amLODipine (NORVASC) 5 MG tablet  Take one tablet on non dialysis day  By mouth.  Marland Kitchen atorvastatin (LIPITOR) 20 MG tablet Take 0.5 tablets (10 mg total) by mouth daily.  Marland Kitchen  Besifloxacin HCl (BESIVANCE) 0.6 % SUSP Place 1 drop into both eyes See admin instructions. Use eye drops 4 times daily for 2 days following injection by Dr. Zigmund Daniel (every 10 weeks) Next injection due 02-2016  . carvedilol (COREG) 25 MG tablet TAKE ONE TABLET BY MOUTH TWICE DAILY WITH MEALS  . cilostazol (PLETAL) 100 MG tablet TAKE ONE TABLET BY MOUTH TWICE DAILY  . clopidogrel (PLAVIX) 75 MG tablet Take 1 tablet (75 mg total) by mouth daily.  Marland Kitchen glucose blood (TRUE METRIX BLOOD GLUCOSE TEST) test strip 1 each by Other route as needed for other. Use as instructed to test sugars 2-3 times daily. Dx. E11.40  . isosorbide mononitrate (IMDUR) 120 MG 24 hr tablet Take 1 tablet (120 mg total) by mouth daily.  Marland Kitchen levothyroxine (SYNTHROID, LEVOTHROID) 125 MCG tablet TAKE ONE TABLET BY MOUTH ONCE DAILY BEFORE BREAKFAST (Patient taking differently: Take 125 mcg by mouth daily before breakfast. )  . lidocaine-prilocaine (EMLA) cream Apply 1 application topically See admin instructions. Apply prior to dialysis treatments on Tuesday, Thursday and Saturday  . loperamide (IMODIUM A-D) 2 MG tablet Take 4 mg by mouth 2 (two) times daily as needed for diarrhea or loose stools.  Marland Kitchen NITROSTAT 0.4 MG SL tablet Place 1 tablet (0.4 mg total) under the tongue every 5 (five) minutes as needed for chest pain. Max 3 doses.  . sevelamer carbonate (RENVELA) 800 MG tablet Take 2,400-3,200 mg by mouth See admin instructions. Take 4 tablets (3200 mg) with meals and 3 tablets (2400 mg) with snacks  . [DISCONTINUED] atorvastatin (LIPITOR) 20 MG tablet Take 1 tablet (20 mg total) by mouth daily.  --  takes amlodipine on the night of his dialysis so it lasts all day on the off day  Allergies  Allergen Reactions  . Brilinta [Ticagrelor] Shortness Of Breath  . Shellfish Allergy Other (See Comments)     Causes gout flare-ups    Social History   Social History  . Marital status: Married    Spouse name: N/A  . Number of children: 3  . Years of education: N/A   Social History Main Topics  . Smoking status: Never Smoker  . Smokeless tobacco: Never Used  . Alcohol use No  . Drug use: No  . Sexual activity: No   Other Topics Concern  . None   Social History Narrative   Long-term patient of Dr. Rollene Fare.   Married father of 54, grandfather 43.   Never smoked.   Not exercising D2 hip and back pain & now Angina    family history includes Diabetes in his father and mother; Heart attack in his father and mother; Heart disease in his father and mother; Hypertension in his father and mother; Stroke in his paternal aunt and paternal uncle.  Wt Readings from Last 3 Encounters:  12/28/15 65.7 kg (144 lb 12.8 oz)  12/21/15 65.5 kg (144 lb 6 oz)  10/28/15 67.1 kg (148 lb)    PHYSICAL EXAM BP 114/60   Pulse 61   Ht 5\' 5"  (1.651 m)   Wt 65.7 kg (144 lb 12.8 oz)   BMI 24.10 kg/m  General appearance: alert, cooperative, appears stated age, no distress; and Borderline obese. Chronically ill-appearing, pale, well groomed.Marland Kitchen answers questions appropriately.  Neck: no adenopathy, no carotid bruit, mild JVD; Supple, symmetrical, trachea midline  Lungs: CTAB, normal percussion bilaterally and Nonlabored, good air movement  Heart: normal apical impulse, RRR, S1, S2 normal, S4 present, SEM 2/6, crescendo, decrescendo and harsh  at 2nd right intercostal space, no click and no rub  Abdomen: soft, non-tender; bowel sounds normal; no masses, no organomegaly and Mild truncal obesity  Extremities: extremities normal, atraumatic, no cyanosis or edema and fistula in the left upper arm with good thrill - very aneurysmal Pulses: 2+ and symmetric - lower extremity. Reduced radial pulses secondary to left AV fistula and right chronically reduced Neurologic: Grossly normal; overwhelming spirits.  Smiling.    Adult ECG Report  Rate: 61 ;  Rhythm: normal sinus rhythm and LBBB with stable QTc ~511; otw normal axis & intervals.;   Narrative Interpretation: stable EKG   Other studies Reviewed: Additional studies/ records that were reviewed today include:  Recent Labs:   Lab Results  Component Value Date   CHOL 107 08/10/2015   HDL 28.80 (L) 08/10/2015   LDLCALC 56 08/10/2015   LDLDIRECT 33.5 12/22/2012   TRIG 113.0 08/10/2015   CHOLHDL 4 08/10/2015    ASSESSMENT / PLAN: Problem List Items Addressed This Visit    CAD (coronary artery disease) of bypass graft - PCI to SVG-OM with BMS; PCI-mid RCA with a Xience DES (Chronic)   Relevant Medications   atorvastatin (LIPITOR) 20 MG tablet   Coronary artery disease involving native coronary artery of native heart with angina pectoris (HCC) - Primary (Chronic)    Overall doing well. 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 He is only taking 3-4 doses of nitroglycerin week which is a significant reduction in his previous amount. We added the amlodipine which really seems to have helped. Where now over a year and a half from his last PCI  He is on Plavix, low-dose atorvastatin, carvedilol along with amlodipine, cilostazol and Imdur for antianginal effect      Relevant Medications   atorvastatin (LIPITOR) 20 MG tablet   Other Relevant Orders   EKG 12-Lead (Completed)   Chronic stable angina (HCC) (Chronic)    Almost more notably angina decubitus, but he still has his chronic exertional angina if he overdoes it which for him as not much. He is however able to get out and do more walking and yard work and he had in the past.  Not a lot more options for anti-anginal medications beyond what he is artery on. With him being on dialysis, Ranexa is unfortunately not a good option and he did not do well with it in the past.      Relevant Medications   atorvastatin (LIPITOR) 20 MG tablet   Other Relevant Orders   EKG  12-Lead (Completed)   Hyperlipidemia with target LDL less than 70 (Chronic)    His total cholesterol and LDL levels look great as do his triglyceride levels. His HDL is chronically low. Partly due to his borderline malnutrition, his cholesterol levels are trending below. I think we can probably reduce atorvastatin 10 mg a day. He's been having some memory loss issues.  Talk about the importance of increasing his dietary protein and omega-3 fats as opposed to carbohydrates.      Relevant Medications   atorvastatin (LIPITOR) 20 MG tablet   Other Relevant Orders   EKG 12-Lead (Completed)   Essential hypertension (Chronic)    Well-controlled. Does still dropped during postdialysis times has orthostatic dizziness after taking amlodipine at night. However it re-equilibrates relatively quickly.      Relevant Medications   atorvastatin (LIPITOR) 20 MG tablet   Other Relevant Orders   EKG 12-Lead (Completed)   Chronic combined systolic and diastolic congestive heart  failure, NYHA class 2 (HCC) (Chronic)   Relevant Medications   atorvastatin (LIPITOR) 20 MG tablet   Other Relevant Orders   EKG 12-Lead (Completed)   Carotid arterial disease ([place])    He just had his carotid Dopplers done today. Will follow-up.      Relevant Medications   atorvastatin (LIPITOR) 20 MG tablet   Other Relevant Orders   EKG 12-Lead (Completed)      Current medicines are reviewed at length with the patient today. (+/- concerns) None The following changes have been made: Reduce atorvastatin to 10 mg daily.  Patient Instructions  DECREASE ATORVASTATIN TO 10 MG ( 1/2 TABLET OF 20 MG)  NO OTHER CHANGES    Your physician recommends that you schedule a follow-up appointment in [place] - 30 MIN   If you need a refill on your cardiac medications before your next appointment, please call your pharmacy.    Studies Ordered:   Orders Placed This Encounter  Procedures  . EKG 12-Lead       [name], M.D., M.S. Interventional Cardiologist   Pager # [phone] Phone # [phone] [address] [place], [place] [address]

## 2015-12-28 NOTE — Patient Instructions (Signed)
DECREASE ATORVASTATIN TO 10 MG ( 1/2 TABLET OF 20 MG)  NO OTHER CHANGES    Your physician recommends that you schedule a follow-up appointment in [place] - 30 MIN   If you need a refill on your cardiac medications before your next appointment, please call your pharmacy.

## 2015-12-28 NOTE — Assessment & Plan Note (Signed)
His total cholesterol and LDL levels look great as do his triglyceride levels. His HDL is chronically low. Partly due to his borderline malnutrition, his cholesterol levels are trending below. I think we can probably reduce atorvastatin 10 mg a day. He's been having some memory loss issues.  Talk about the importance of increasing his dietary protein and omega-3 fats as opposed to carbohydrates.

## 2015-12-28 NOTE — Assessment & Plan Note (Signed)
Well-controlled. Does still dropped during postdialysis times has orthostatic dizziness after taking amlodipine at night. However it re-equilibrates relatively quickly.

## 2015-12-29 ENCOUNTER — Encounter: Payer: Self-pay | Admitting: Cardiology

## 2016-01-08 DIAGNOSIS — E1129 Type 2 diabetes mellitus with other diabetic kidney complication: Secondary | ICD-10-CM | POA: Diagnosis not present

## 2016-01-08 DIAGNOSIS — Z992 Dependence on renal dialysis: Secondary | ICD-10-CM | POA: Diagnosis not present

## 2016-01-08 DIAGNOSIS — N186 End stage renal disease: Secondary | ICD-10-CM | POA: Diagnosis not present

## 2016-01-10 ENCOUNTER — Other Ambulatory Visit: Payer: Self-pay | Admitting: Cardiovascular Disease

## 2016-01-10 DIAGNOSIS — I739 Peripheral vascular disease, unspecified: Principal | ICD-10-CM

## 2016-01-10 DIAGNOSIS — I779 Disorder of arteries and arterioles, unspecified: Secondary | ICD-10-CM

## 2016-01-10 DIAGNOSIS — D631 Anemia in chronic kidney disease: Secondary | ICD-10-CM | POA: Diagnosis not present

## 2016-01-10 DIAGNOSIS — E876 Hypokalemia: Secondary | ICD-10-CM | POA: Diagnosis not present

## 2016-01-10 DIAGNOSIS — E1129 Type 2 diabetes mellitus with other diabetic kidney complication: Secondary | ICD-10-CM | POA: Diagnosis not present

## 2016-01-10 DIAGNOSIS — D509 Iron deficiency anemia, unspecified: Secondary | ICD-10-CM | POA: Diagnosis not present

## 2016-01-10 DIAGNOSIS — N2581 Secondary hyperparathyroidism of renal origin: Secondary | ICD-10-CM | POA: Diagnosis not present

## 2016-01-10 DIAGNOSIS — N186 End stage renal disease: Secondary | ICD-10-CM | POA: Diagnosis not present

## 2016-01-13 ENCOUNTER — Other Ambulatory Visit: Payer: Self-pay | Admitting: Cardiology

## 2016-02-02 ENCOUNTER — Encounter: Payer: Self-pay | Admitting: Family Medicine

## 2016-02-02 ENCOUNTER — Ambulatory Visit (INDEPENDENT_AMBULATORY_CARE_PROVIDER_SITE_OTHER): Payer: Medicare Other | Admitting: Family Medicine

## 2016-02-02 VITALS — BP 116/62 | HR 66 | Temp 97.8°F | Resp 18 | Ht 65.0 in | Wt 141.5 lb

## 2016-02-02 DIAGNOSIS — F411 Generalized anxiety disorder: Secondary | ICD-10-CM

## 2016-02-02 DIAGNOSIS — R197 Diarrhea, unspecified: Secondary | ICD-10-CM

## 2016-02-02 DIAGNOSIS — R1013 Epigastric pain: Secondary | ICD-10-CM

## 2016-02-02 DIAGNOSIS — E1129 Type 2 diabetes mellitus with other diabetic kidney complication: Secondary | ICD-10-CM | POA: Diagnosis not present

## 2016-02-02 LAB — CBC WITH DIFFERENTIAL/PLATELET
Basophils Absolute: 91 cells/uL (ref 0–200)
Basophils Relative: 1 %
EOS ABS: 364 {cells}/uL (ref 15–500)
EOS PCT: 4 %
HCT: 39.3 % (ref 38.5–50.0)
Hemoglobin: 12.6 g/dL — ABNORMAL LOW (ref 13.2–17.1)
Lymphocytes Relative: 9 %
Lymphs Abs: 819 cells/uL — ABNORMAL LOW (ref 850–3900)
MCH: 29.9 pg (ref 27.0–33.0)
MCHC: 32.1 g/dL (ref 32.0–36.0)
MCV: 93.3 fL (ref 80.0–100.0)
MONOS PCT: 10 %
MPV: 8.9 fL (ref 7.5–12.5)
Monocytes Absolute: 910 cells/uL (ref 200–950)
Neutro Abs: 6916 cells/uL (ref 1500–7800)
Neutrophils Relative %: 76 %
PLATELETS: 241 10*3/uL (ref 140–400)
RBC: 4.21 MIL/uL (ref 4.20–5.80)
RDW: 15 % (ref 11.0–15.0)
WBC: 9.1 10*3/uL (ref 3.8–10.8)

## 2016-02-02 MED ORDER — SUCRALFATE 1 G PO TABS: 1.0000 g | ORAL_TABLET | Freq: Three times a day (TID) | ORAL | 1 refills | Status: DC

## 2016-02-02 MED ORDER — CLONAZEPAM 0.5 MG PO TABS
0.5000 mg | ORAL_TABLET | Freq: Two times a day (BID) | ORAL | 1 refills | Status: DC | PRN
Start: 2016-02-02 — End: 2016-04-10

## 2016-02-02 MED ORDER — PANTOPRAZOLE SODIUM 20 MG PO TBEC: 20.0000 mg | DELAYED_RELEASE_TABLET | Freq: Every day | ORAL | 3 refills | Status: DC

## 2016-02-02 NOTE — Progress Notes (Signed)
Pre visit review using our clinic review tool, if applicable. No additional management support is needed unless otherwise documented below in the visit note. 

## 2016-02-02 NOTE — Patient Instructions (Signed)
Follow up as scheduled- sooner if needed We'll notify you of your lab results and make any changes if needed Start the Sulcrafate 3x/day w/ meals and before bed to protect the stomach Start the pantoprozole daily to decrease acid production We'll call you with your GI appt Use the Clonazepam for anxiety as needed Make sure you are eating and drinking regularly Call with any questions or concerns Hang in there!!!

## 2016-02-02 NOTE — Progress Notes (Signed)
   Subjective:    Patient ID: [name] [name], male    DOB: 1940-02-03, 76 y.o.   MRN: [id number]  HPI Abd cramping- 'feels like something's gnawing away in there'.  sxs started ~3 weeks ago.  Mild, intermittent nausea.  sxs are epigastric.  Denies GERD.  Has taken a few of wife's acid pills w/o relief.  Pt is down 3 lbs since 12/20.  S/p colon resection in 2003 for colon cancer.  Pain is not worse w/ eating but he is eating less.  Anxiety- pt is having difficulty when sitting in HD.  'i just want to get up and move'.  Will occasionally have sxs at home but 'i can get up and go do something'.     Review of Systems For ROS see HPI     Objective:   Physical Exam  Constitutional: He is oriented to person, place, and time. He appears well-developed and well-nourished. No distress.  HENT:  Head: Normocephalic and atraumatic.  Eyes: Conjunctivae and EOM are normal. Pupils are equal, round, and reactive to light.  Neck: Normal range of motion. Neck supple. No thyromegaly present.  Cardiovascular: Normal rate, regular rhythm, normal heart sounds and intact distal pulses.   No murmur heard. Pulmonary/Chest: Effort normal and breath sounds normal. No respiratory distress.  Abdominal: Soft. Bowel sounds are normal. He exhibits no distension. There is tenderness (TTP over epigastrum).  Musculoskeletal: He exhibits no edema.  Lymphadenopathy:    He has no cervical adenopathy.  Neurological: He is alert and oriented to person, place, and time. No cranial nerve deficit.  Skin: Skin is warm and dry.  Psychiatric: He has a normal mood and affect. His behavior is normal.  Vitals reviewed.         Assessment & Plan:  Epigastric pain- new.  Given pt's description of gnawing pain w/ intermittent nausea, I suspect he has gastritis.  Start PPI to reduce acid production.  Check labs to r/o H pylori or other causes.  If no improvement, will need GI referral.  Reviewed supportive care and red flags that  should prompt return.  Pt expressed understanding and is in agreement w/ plan.   Anxiety- deteriorated.  Pt has hx of this and was on Clonopin previously but stopped when sxs improved.  He is now having severe anxiety at HD.  Will restart Clonazepam to use PRN.  Will follow closely.

## 2016-02-03 LAB — HEPATIC FUNCTION PANEL
ALK PHOS: 113 U/L (ref 40–115)
ALT: 10 U/L (ref 9–46)
AST: 12 U/L (ref 10–35)
Albumin: 4.1 g/dL (ref 3.6–5.1)
BILIRUBIN DIRECT: 0.1 mg/dL (ref <=0.2)
BILIRUBIN INDIRECT: 0.3 mg/dL (ref 0.2–1.2)
BILIRUBIN TOTAL: 0.4 mg/dL (ref 0.2–1.2)
Total Protein: 7 g/dL (ref 6.1–8.1)

## 2016-02-03 LAB — H. PYLORI ANTIBODY, IGG: H Pylori IgG: NEGATIVE

## 2016-02-03 LAB — AMYLASE: Amylase: 77 U/L (ref 0–105)

## 2016-02-03 LAB — LIPASE: Lipase: 49 U/L (ref 7–60)

## 2016-02-05 DIAGNOSIS — F411 Generalized anxiety disorder: Secondary | ICD-10-CM | POA: Insufficient documentation

## 2016-02-08 DIAGNOSIS — E1129 Type 2 diabetes mellitus with other diabetic kidney complication: Secondary | ICD-10-CM | POA: Diagnosis not present

## 2016-02-08 DIAGNOSIS — Z992 Dependence on renal dialysis: Secondary | ICD-10-CM | POA: Diagnosis not present

## 2016-02-08 DIAGNOSIS — N186 End stage renal disease: Secondary | ICD-10-CM | POA: Diagnosis not present

## 2016-02-09 DIAGNOSIS — N2581 Secondary hyperparathyroidism of renal origin: Secondary | ICD-10-CM | POA: Diagnosis not present

## 2016-02-09 DIAGNOSIS — D631 Anemia in chronic kidney disease: Secondary | ICD-10-CM | POA: Diagnosis not present

## 2016-02-09 DIAGNOSIS — E1129 Type 2 diabetes mellitus with other diabetic kidney complication: Secondary | ICD-10-CM | POA: Diagnosis not present

## 2016-02-09 DIAGNOSIS — Z283 Underimmunization status: Secondary | ICD-10-CM | POA: Diagnosis not present

## 2016-02-09 DIAGNOSIS — Z23 Encounter for immunization: Secondary | ICD-10-CM | POA: Diagnosis not present

## 2016-02-09 DIAGNOSIS — E876 Hypokalemia: Secondary | ICD-10-CM | POA: Diagnosis not present

## 2016-02-09 DIAGNOSIS — N186 End stage renal disease: Secondary | ICD-10-CM | POA: Diagnosis not present

## 2016-02-09 DIAGNOSIS — D509 Iron deficiency anemia, unspecified: Secondary | ICD-10-CM | POA: Diagnosis not present

## 2016-02-10 ENCOUNTER — Encounter (INDEPENDENT_AMBULATORY_CARE_PROVIDER_SITE_OTHER): Payer: Medicare Other | Admitting: Ophthalmology

## 2016-02-10 DIAGNOSIS — E113313 Type 2 diabetes mellitus with moderate nonproliferative diabetic retinopathy with macular edema, bilateral: Secondary | ICD-10-CM | POA: Diagnosis not present

## 2016-02-10 DIAGNOSIS — H35033 Hypertensive retinopathy, bilateral: Secondary | ICD-10-CM

## 2016-02-10 DIAGNOSIS — H43813 Vitreous degeneration, bilateral: Secondary | ICD-10-CM | POA: Diagnosis not present

## 2016-02-10 DIAGNOSIS — E11311 Type 2 diabetes mellitus with unspecified diabetic retinopathy with macular edema: Secondary | ICD-10-CM

## 2016-02-10 DIAGNOSIS — I10 Essential (primary) hypertension: Secondary | ICD-10-CM | POA: Diagnosis not present

## 2016-02-11 DIAGNOSIS — E1129 Type 2 diabetes mellitus with other diabetic kidney complication: Secondary | ICD-10-CM | POA: Diagnosis not present

## 2016-02-11 DIAGNOSIS — N186 End stage renal disease: Secondary | ICD-10-CM | POA: Diagnosis not present

## 2016-02-11 DIAGNOSIS — D631 Anemia in chronic kidney disease: Secondary | ICD-10-CM | POA: Diagnosis not present

## 2016-02-11 DIAGNOSIS — Z283 Underimmunization status: Secondary | ICD-10-CM | POA: Diagnosis not present

## 2016-02-11 DIAGNOSIS — D509 Iron deficiency anemia, unspecified: Secondary | ICD-10-CM | POA: Diagnosis not present

## 2016-02-11 DIAGNOSIS — N2581 Secondary hyperparathyroidism of renal origin: Secondary | ICD-10-CM | POA: Diagnosis not present

## 2016-02-13 ENCOUNTER — Encounter: Payer: Self-pay | Admitting: Physician Assistant

## 2016-02-13 ENCOUNTER — Telehealth: Payer: Self-pay | Admitting: *Deleted

## 2016-02-13 ENCOUNTER — Ambulatory Visit (INDEPENDENT_AMBULATORY_CARE_PROVIDER_SITE_OTHER): Payer: Medicare Other | Admitting: Physician Assistant

## 2016-02-13 VITALS — BP 104/52 | HR 68 | Ht 64.0 in | Wt 147.8 lb

## 2016-02-13 DIAGNOSIS — R1319 Other dysphagia: Secondary | ICD-10-CM | POA: Diagnosis not present

## 2016-02-13 DIAGNOSIS — Z85038 Personal history of other malignant neoplasm of large intestine: Secondary | ICD-10-CM

## 2016-02-13 DIAGNOSIS — R634 Abnormal weight loss: Secondary | ICD-10-CM

## 2016-02-13 DIAGNOSIS — R1013 Epigastric pain: Secondary | ICD-10-CM

## 2016-02-13 DIAGNOSIS — R197 Diarrhea, unspecified: Secondary | ICD-10-CM

## 2016-02-13 MED ORDER — METRONIDAZOLE 250 MG PO TABS: 250.0000 mg | ORAL_TABLET | Freq: Four times a day (QID) | ORAL | 0 refills | Status: DC

## 2016-02-13 NOTE — Progress Notes (Addendum)
Subjective:    Patient ID: [name] [name], male    DOB: 1940-03-26, 76 y.o.   MRN: [id number]  HPI [name] [name] is a pleasant 76 year old white male, former patient of Dr. Sydell Axon Brodie's who comes in today with complaints of gnawing upper abdominal pain. He was seen by Dr. Birdie Riddle on 02/02/2016 and started on Protonix 20 mg by mouth daily and Carafate 1 g 4 times daily. He says medications have helped and he feels better. He is still concerned because he has had a significant weight loss over the past several years. He says he has lost 100 pounds since he had his colon resection in 2003 and is probably lost 20 pounds over the past year or so. He says he eats but just not as much as he used to. He denies any nausea. He has had some solid food dysphagia as well as pill dysphagia. Says this is been going on for some time. He has to stop eating and allow the food to pass. He has not been having any regurgitation. He denies any melena or hematochezia but does get occasional bouts of diarrhea at least once per week. He uses Imodium. We had given him a course of Flagyl in 2016 and said that worked very well and would like to have another course. 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 His last colonoscopy was done in 2006 per Dr. Lajoyce Corners and was negative. EGD also done in 2006 showed a distal esophagitis. Recent labs done 02/02/2016 H. pylori antibody negative CBC and see met and lipase all unremarkable. Patient has significant comorbidities including hypertension, congestive heart failure with EF of 45-50%, coronary artery disease status post stents, history of prior MI, chronic kidney disease for which he is on dialysis. He is maintained on Plavix and Pletal and followed by Dr. Harding/cardiology.  Review of Systems Pertinent positive and negative review of systems were noted in the above HPI section.  All other review of  systems was otherwise negative.  Outpatient Encounter Prescriptions as of 02/13/2016  Medication Sig  . acetaminophen (TYLENOL) 500 MG tablet Take 1,000 mg by mouth every 6 (six) hours as needed for mild pain.  Marland Kitchen amLODipine (NORVASC) 5 MG tablet Take one tablet on non dialysis day  By mouth.  Marland Kitchen atorvastatin (LIPITOR) 20 MG tablet Take 0.5 tablets (10 mg total) by mouth daily.  Marland Kitchen Besifloxacin HCl (BESIVANCE) 0.6 % SUSP Place 1 drop into both eyes See admin instructions. Use eye drops 4 times daily for 2 days following injection by Dr. Zigmund Daniel (every 10 weeks) Next injection due 02-2016  . carvedilol (COREG) 25 MG tablet TAKE ONE TABLET BY MOUTH TWICE DAILY WITH MEALS  . cilostazol (PLETAL) 100 MG tablet TAKE ONE TABLET BY MOUTH TWICE DAILY  . clonazePAM (KLONOPIN) 0.5 MG tablet Take 1 tablet (0.5 mg total) by mouth 2 (two) times daily as needed for anxiety.  . clopidogrel (PLAVIX) 75 MG tablet Take 1 tablet (75 mg total) by mouth daily.  Marland Kitchen glucose blood (TRUE METRIX BLOOD GLUCOSE TEST) test strip 1 each by Other route as needed for other. Use as instructed to test sugars 2-3 times daily. Dx. E11.40  . isosorbide mononitrate (IMDUR) 120 MG 24 hr tablet Take 1 tablet (120 mg total) by mouth daily.  Marland Kitchen levothyroxine (SYNTHROID, LEVOTHROID) 125 MCG tablet TAKE ONE TABLET BY MOUTH ONCE DAILY BEFORE BREAKFAST (Patient taking differently: Take 125  mcg by mouth daily before breakfast. )  . lidocaine-prilocaine (EMLA) cream Apply 1 application topically See admin instructions. Apply prior to dialysis treatments on Tuesday, Thursday and Saturday  . loperamide (IMODIUM A-D) 2 MG tablet Take 4 mg by mouth 2 (two) times daily as needed for diarrhea or loose stools.  Marland Kitchen NITROSTAT 0.4 MG SL tablet PLACE 1 TABLET UNDER THE TONGUE EVERY 5 MINUTES AS NEEDED FOR CHEST PAIN (MAX 3 DOSES PER DAY)  . pantoprazole (PROTONIX) 20 MG tablet Take 1 tablet (20 mg total) by mouth daily.  . sevelamer carbonate (RENVELA) 800 MG  tablet Take 2,400-3,200 mg by mouth See admin instructions. Take 4 tablets (3200 mg) with meals and 3 tablets (2400 mg) with snacks  . sucralfate (CARAFATE) 1 g tablet Take 1 tablet (1 g total) by mouth 4 (four) times daily -  with meals and at bedtime.  . metroNIDAZOLE (FLAGYL) 250 MG tablet Take 1 tablet (250 mg total) by mouth 4 (four) times daily.   No facility-administered encounter medications on file as of 02/13/2016.    Allergies  Allergen Reactions  . Brilinta [Ticagrelor] Shortness Of Breath  . Shellfish Allergy Other (See Comments)    Causes gout flare-ups   Patient Active Problem List   Diagnosis Date Noted  . Anxiety state 02/05/2016  . Chronic stable angina ([place]) 10/02/2015  . Left shoulder pain 03/09/2015  . Hypotension of hemodialysis 01/28/2015  . Coronary stent restenosis due to scar tissue 01/28/2015  . Watery diarrhea 07/01/2014  . Pain in the chest   . Plavix resistance   . LBBB (left bundle branch block) 04/19/2014  . Carotid arterial disease ([place])   . Coronary artery disease involving native coronary artery of native heart with angina pectoris ([place])   . Presence of drug coated stent in right coronary artery 04/30/2013  . Cough 04/13/2013  . OM (otitis media) 01/21/2013  . History of Non-ST elevated myocardial infarction (non-STEMI)   . Orthopnea 09/29/2012  . CAD (coronary artery disease) of bypass graft - PCI to SVG-OM with BMS; PCI-mid RCA with a Xience DES 09/29/2012  . Routine general medical examination at a health care facility 06/20/2012  . Decreased radial pulse 03/17/2012  . Medication management 12/17/2011  . Breast lump 07/25/2011  . Neoplasm of uncertain behavior of skin 06/18/2011  . 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 CAD S/P percutaneous coronary angioplasty 10/09/2010  . DIARRHEA, CHRONIC 08/17/2009  . HIATAL HERNIA 08/03/2009  . Personal history of other diseases of digestive system 08/03/2009  . RASH-NONVESICULAR  04/14/2009  . ANEMIA, IRON DEFICIENCY 02/18/2009  . RINGWORM 02/15/2009  . Chronic combined systolic and diastolic congestive heart failure, NYHA class 2 ([place]) 02/15/2009  . Hypothyroidism 12/08/2007  . Diabetes mellitus with neuropathy ([place]) 12/08/2007  . Hyperlipidemia with target LDL less than 70 12/08/2007  . Gout 12/08/2007  . Essential hypertension 12/08/2007  . RENAL INSUFFICIENCY, CHRONIC 12/08/2007  . LOW BACK PAIN 12/08/2007  . COLON CANCER, HX OF 12/08/2007  . CEREBROVASCULAR ACCIDENT, HX OF 12/08/2007   Social History   Social History  . Marital status: Married    Spouse name: N/A  . Number of children: 3  . Years of education: N/A   Occupational History  . Not on file.   Social History Main Topics  . Smoking status: Never Smoker  . Smokeless tobacco: Never Used  . Alcohol use No  . Drug use: No  . Sexual activity: No   Other  Topics Concern  . Not on file   Social History Narrative   Long-term patient of Dr. Rollene Fare.   Married father of 60, grandfather 30.   Never smoked.   Not exercising D2 hip and back pain & now Angina    Mr. Domagalski family history includes Diabetes in his father and mother; Heart attack in his father and mother; Heart disease in his father and mother; Hypertension in his father and mother; Stroke in his paternal aunt and paternal uncle.      Objective:    Vitals:   02/13/16 0856  BP: (!) 104/52  Pulse: 68    Physical Exam  Well-developed elderly white male in no acute distress, pleasant, blood pressure 104/52 pulse 68, BMI 25.3. HEENT; nontraumatic normocephalic EOMI PERRLA sclera anicteric, Cardiovascular; regular rate and rhythm with S1-S2 no murmur rub or gallop, Pulmonary ;clear bilaterally, Abdomen ;soft, no focal tenderness, no palpable mass or hepatosplenomegaly bowel sounds are present, Rectal; exam not done, Extremities; no clubbing cyanosis or edema skin warm and dry, Neuropsych; mood and affect appropriate         Assessment & Plan:   #41 76 year old white male with recent gnawing epigastric pain improved with PPI and Carafate, symptoms most consistent with gastritis or peptic ulcer disease #2 long term continuous weight loss #3 end-stage renal disease on dialysis #4 congestive heart failure EF 45-50% #5 coronary artery disease status post stents #6 chronic antiplatelet therapy on Plavix and Pletal #7 remote history of colon cancer 2003, last colonoscopy 2006 negative per Dr. Jaymes Graff #8 intermittent diarrhea long-standing-prior response to empiric course of metronidazole,, suggesting probable component of bacterial overgrowth #9 solid food dysphagia-rule out stricture  Plan; Continue Protonix 20 mg by mouth every morning Continue Carafate 1 g by mouth 4 times a day 1 month Give a empiric course of metronidazole 250 mg by mouth 4 times a day 14 days Patient will be scheduled for EGD with possible dilation and colonoscopy with Dr. Carlean Purl. Procedures discussed in detail with patient including risks and benefits and he is agreeable to proceed. This will need to be scheduled on a nondialysis day. Pt  will need to hold Plavix and Pletal for 5 days prior to procedures. We will communicate with his cardiologist Dr. Ellyn Hack who is sure that this is reasonable for this patient.     Emelly Wurtz Genia Harold PA-C 02/13/2016  Agree with Ms. Genia Harold assessment and plan. Gatha Mayer, MD, [facility]   Cc: Midge Minium, MD

## 2016-02-13 NOTE — Patient Instructions (Signed)
We will call you when we hear from Dr. Ellyn Hack regarding the Pletal medication instructions.  Continue the Protonix  Take 1 tab every morning.  Continue Carafate 1 gram 4 times daily. Between meals and at bedtime.  We sent a prescription to [facility] for the Flagyl * Metronidazole 250 mg.  You have been scheduled for an endoscopy and colonoscopy. Please follow the written instructions given to you at your visit today. Please pick up your prep supplies at the pharmacy within the next 1-3 days. If you use inhalers (even only as needed), please bring them with you on the day of your procedure.

## 2016-02-13 NOTE — Telephone Encounter (Signed)
02/13/2016   RE: [name] [name] DOB: 1940-09-30 MRN: [id number]   Dear Dr. [name],    We have scheduled the above patient for an endoscopic procedure. Our records show that he is on anticoagulation therapy.   Please advise as to how long the patient may come off his therapy of Pletal and Plavix prior to the procedure, which is scheduled for 02-22-2016.  Please  route the Pletal and Plavix clearance instructions to [place] .  Sincerely,    Amy Esterwood PA-C

## 2016-02-14 DIAGNOSIS — E1129 Type 2 diabetes mellitus with other diabetic kidney complication: Secondary | ICD-10-CM | POA: Diagnosis not present

## 2016-02-14 DIAGNOSIS — D509 Iron deficiency anemia, unspecified: Secondary | ICD-10-CM | POA: Diagnosis not present

## 2016-02-14 DIAGNOSIS — N186 End stage renal disease: Secondary | ICD-10-CM | POA: Diagnosis not present

## 2016-02-14 DIAGNOSIS — Z283 Underimmunization status: Secondary | ICD-10-CM | POA: Diagnosis not present

## 2016-02-14 DIAGNOSIS — N2581 Secondary hyperparathyroidism of renal origin: Secondary | ICD-10-CM | POA: Diagnosis not present

## 2016-02-14 DIAGNOSIS — D631 Anemia in chronic kidney disease: Secondary | ICD-10-CM | POA: Diagnosis not present

## 2016-02-16 DIAGNOSIS — Z283 Underimmunization status: Secondary | ICD-10-CM | POA: Diagnosis not present

## 2016-02-16 DIAGNOSIS — N186 End stage renal disease: Secondary | ICD-10-CM | POA: Diagnosis not present

## 2016-02-16 DIAGNOSIS — D509 Iron deficiency anemia, unspecified: Secondary | ICD-10-CM | POA: Diagnosis not present

## 2016-02-16 DIAGNOSIS — E1129 Type 2 diabetes mellitus with other diabetic kidney complication: Secondary | ICD-10-CM | POA: Diagnosis not present

## 2016-02-16 DIAGNOSIS — N2581 Secondary hyperparathyroidism of renal origin: Secondary | ICD-10-CM | POA: Diagnosis not present

## 2016-02-16 DIAGNOSIS — D631 Anemia in chronic kidney disease: Secondary | ICD-10-CM | POA: Diagnosis not present

## 2016-02-16 NOTE — Telephone Encounter (Signed)
He is fine to undergo endoscopic procedure. Okay to hold Pletal and Plavix 5 days preprocedure and restart next day post procedure.  [name], MD

## 2016-02-17 NOTE — Telephone Encounter (Signed)
Advised the patient that Dr. Ellyn Hack gave instructions for the patient to be off the Plavix and Pletal starting on 02-17-2016 through and including the 02-22-2016.  He can resume both medicaitons on 02-23-2016.  The patient verbalized understanding the instructions.

## 2016-02-18 DIAGNOSIS — D509 Iron deficiency anemia, unspecified: Secondary | ICD-10-CM | POA: Diagnosis not present

## 2016-02-18 DIAGNOSIS — Z283 Underimmunization status: Secondary | ICD-10-CM | POA: Diagnosis not present

## 2016-02-18 DIAGNOSIS — D631 Anemia in chronic kidney disease: Secondary | ICD-10-CM | POA: Diagnosis not present

## 2016-02-18 DIAGNOSIS — N2581 Secondary hyperparathyroidism of renal origin: Secondary | ICD-10-CM | POA: Diagnosis not present

## 2016-02-18 DIAGNOSIS — E1129 Type 2 diabetes mellitus with other diabetic kidney complication: Secondary | ICD-10-CM | POA: Diagnosis not present

## 2016-02-18 DIAGNOSIS — N186 End stage renal disease: Secondary | ICD-10-CM | POA: Diagnosis not present

## 2016-02-21 DIAGNOSIS — D631 Anemia in chronic kidney disease: Secondary | ICD-10-CM | POA: Diagnosis not present

## 2016-02-21 DIAGNOSIS — Z283 Underimmunization status: Secondary | ICD-10-CM | POA: Diagnosis not present

## 2016-02-21 DIAGNOSIS — N186 End stage renal disease: Secondary | ICD-10-CM | POA: Diagnosis not present

## 2016-02-21 DIAGNOSIS — E1129 Type 2 diabetes mellitus with other diabetic kidney complication: Secondary | ICD-10-CM | POA: Diagnosis not present

## 2016-02-21 DIAGNOSIS — N2581 Secondary hyperparathyroidism of renal origin: Secondary | ICD-10-CM | POA: Diagnosis not present

## 2016-02-21 DIAGNOSIS — D509 Iron deficiency anemia, unspecified: Secondary | ICD-10-CM | POA: Diagnosis not present

## 2016-02-22 ENCOUNTER — Encounter: Payer: Self-pay | Admitting: Internal Medicine

## 2016-02-22 ENCOUNTER — Ambulatory Visit (AMBULATORY_SURGERY_CENTER): Payer: Medicare Other | Admitting: Internal Medicine

## 2016-02-22 VITALS — BP 128/63 | HR 71 | Temp 97.1°F | Resp 9 | Ht 64.0 in | Wt 147.0 lb

## 2016-02-22 DIAGNOSIS — Z951 Presence of aortocoronary bypass graft: Secondary | ICD-10-CM | POA: Diagnosis not present

## 2016-02-22 DIAGNOSIS — K2950 Unspecified chronic gastritis without bleeding: Secondary | ICD-10-CM | POA: Diagnosis not present

## 2016-02-22 DIAGNOSIS — R1310 Dysphagia, unspecified: Secondary | ICD-10-CM | POA: Diagnosis not present

## 2016-02-22 DIAGNOSIS — K269 Duodenal ulcer, unspecified as acute or chronic, without hemorrhage or perforation: Secondary | ICD-10-CM | POA: Diagnosis not present

## 2016-02-22 DIAGNOSIS — R1013 Epigastric pain: Secondary | ICD-10-CM | POA: Diagnosis not present

## 2016-02-22 DIAGNOSIS — N186 End stage renal disease: Secondary | ICD-10-CM | POA: Diagnosis not present

## 2016-02-22 DIAGNOSIS — R197 Diarrhea, unspecified: Secondary | ICD-10-CM | POA: Diagnosis not present

## 2016-02-22 DIAGNOSIS — Z85038 Personal history of other malignant neoplasm of large intestine: Secondary | ICD-10-CM | POA: Diagnosis not present

## 2016-02-22 DIAGNOSIS — R634 Abnormal weight loss: Secondary | ICD-10-CM | POA: Diagnosis not present

## 2016-02-22 DIAGNOSIS — Z992 Dependence on renal dialysis: Secondary | ICD-10-CM | POA: Diagnosis not present

## 2016-02-22 DIAGNOSIS — K2980 Duodenitis without bleeding: Secondary | ICD-10-CM | POA: Diagnosis not present

## 2016-02-22 DIAGNOSIS — I2510 Atherosclerotic heart disease of native coronary artery without angina pectoris: Secondary | ICD-10-CM | POA: Diagnosis not present

## 2016-02-22 DIAGNOSIS — R1319 Other dysphagia: Secondary | ICD-10-CM

## 2016-02-22 MED ORDER — SODIUM CHLORIDE 0.9 % IV SOLN: 500.0000 mL | INTRAVENOUS | Status: DC

## 2016-02-22 NOTE — Patient Instructions (Addendum)
There is inflammation in the top of the intestine (duodenum) and stomach. I dilated the esophagus - it looked a bit narrow - I hope you will swallow better. I took biopsies and will let you know results and plans.  Colonoscopy is ok.  I appreciate the opportunity to care for you. Gatha Mayer, MD, FACG  YOU HAD AN ENDOSCOPIC PROCEDURE TODAY AT [facility]:   Refer to the procedure report that was given to you for any specific questions about what was found during the examination.  If the procedure report does not answer your questions, please call your gastroenterologist to clarify.  If you requested that your care partner not be given the details of your procedure findings, then the procedure report has been included in a sealed envelope for you to review at your convenience later.  YOU SHOULD EXPECT: Some feelings of bloating in the abdomen. Passage of more gas than usual.  Walking can help get rid of the air that was put into your GI tract during the procedure and reduce the bloating. If you had a lower endoscopy (such as a colonoscopy or flexible sigmoidoscopy) you may notice spotting of blood in your stool or on the toilet paper. If you underwent a bowel prep for your procedure, you may not have a normal bowel movement for a few days.  Please Note:  You might notice some irritation and congestion in your nose or some drainage.  This is from the oxygen used during your procedure.  There is no need for concern and it should clear up in a day or so.  SYMPTOMS TO REPORT IMMEDIATELY:   Following lower endoscopy (colonoscopy or flexible sigmoidoscopy):  Excessive amounts of blood in the stool  Significant tenderness or worsening of abdominal pains  Swelling of the abdomen that is new, acute  Fever of 100F or higher   Following upper endoscopy (EGD)  Vomiting of blood or coffee ground material  New chest pain or pain under the shoulder blades  Painful or  persistently difficult swallowing  New shortness of breath  Fever of 100F or higher  Black, tarry-looking stools  For urgent or emergent issues, a gastroenterologist can be reached at any hour by calling [phone].   DIET:  We do recommend a small meal at first, but then you may proceed to your regular diet.  Drink plenty of fluids but you should avoid alcoholic beverages for 24 hours.  ACTIVITY:  You should plan to take it easy for the rest of today and you should NOT DRIVE or use heavy machinery until tomorrow (because of the sedation medicines used during the test).    FOLLOW UP: Our staff will call the number listed on your records the next business day following your procedure to check on you and address any questions or concerns that you may have regarding the information given to you following your procedure. If we do not reach you, we will leave a message.  However, if you are feeling well and you are not experiencing any problems, there is no need to return our call.  We will assume that you have returned to your regular daily activities without incident.  If any biopsies were taken you will be contacted by phone or by letter within the next 1-3 weeks.  Please call us at [phone] if you have not heard about the biopsies in 3 weeks.    SIGNATURES/CONFIDENTIALITY: You and/or your care partner have signed paperwork  which will be entered into your electronic medical record.  These signatures attest to the fact that that the information above on your After Visit Summary has been reviewed and is understood.  Full responsibility of the confidentiality of this discharge information lies with you and/or your care-partner.  After dilation diet given with instructions. Gastritis hand-out given Resume Plavix and pletal today at prior doses. No recall colonoscopy.

## 2016-02-22 NOTE — Progress Notes (Signed)
A/ox3, pleased with MAC, report to RN 

## 2016-02-22 NOTE — Op Note (Signed)
[facility] Patient Name: [name] [name] Procedure Date: 02/22/2016 2:08 PM MRN: [id number] Endoscopist: Gatha Mayer , MD Age: 76 Referring MD:  Date of Birth: 02-26-1940 Gender: Male Account #: [id number] Procedure:                Upper GI endoscopy Indications:              Epigastric abdominal pain, Dysphagia Medicines:                Propofol per Anesthesia, Monitored Anesthesia Care Procedure:                Pre-Anesthesia Assessment:                           - Prior to the procedure, a History and Physical                            was performed, and patient medications and                            allergies were reviewed. The patient's tolerance of                            previous anesthesia was also reviewed. The risks                            and benefits of the procedure and the sedation                            options and risks were discussed with the patient.                            All questions were answered, and informed consent                            was obtained. Prior Anticoagulants: The patient                            last took Plavix (clopidogrel) 5 days and Pletal                            (cilostazol) 5 days prior to the procedure. ASA                            Grade Assessment: III - A patient with severe                            systemic disease. After reviewing the risks and                            benefits, the patient was deemed in satisfactory                            condition to undergo the procedure.  After obtaining informed consent, the endoscope was                            passed under direct vision. Throughout the                            procedure, the patient's blood pressure, pulse, and                            oxygen saturations were monitored continuously. The                            Model GIF-[id number]) scope was introduced                            through  the mouth, and advanced to the second part                            of duodenum. The upper GI endoscopy was                            accomplished without difficulty. The patient                            tolerated the procedure well. Scope In: Scope Out: Findings:                 One mild benign-appearing, intrinsic stenosis was                            found at the gastroesophageal junction. And was                            traversed. The scope was withdrawn. Dilation was                            performed with a Maloney dilator with mild                            resistance at 75 Fr.                           Diffuse mild inflammation was found in the gastric                            antrum. Biopsies were taken with a cold forceps for                            histology. Verification of patient identification                            for the specimen was done. Estimated blood loss was                            minimal.  Multiple diffuse erosions without bleeding were                            found in the duodenal bulb and in the second                            portion of the duodenum. Biopsies were taken with a                            cold forceps for histology. Verification of patient                            identification for the specimen was done. Estimated                            blood loss was minimal. Complications:            No immediate complications. Estimated Blood Loss:     Estimated blood loss was minimal. Impression:               - Benign-appearing esophageal stenosis. Dilated.                           - Gastritis. Biopsied.                           - Duodenal erosions without bleeding. Biopsied. Recommendation:           - Patient has a contact number available for                            emergencies. The signs and symptoms of potential                            delayed complications were discussed with the                             patient. Return to normal activities tomorrow.                            Written discharge instructions were provided to the                            patient.                           - Resume previous diet.                           - Continue present medications.                           - Resume Plavix (clopidogrel) today and Pletal                            (cilostazol) today at prior doses.                           -  Await pathology results.                           - ? if he could have some ischemia - await                            pathology for medicatiobn changes may need higher                            dose PPI Gatha Mayer, MD 02/22/2016 2:50:52 PM This report has been signed electronically.

## 2016-02-22 NOTE — Progress Notes (Signed)
Called to room to assist during endoscopic procedure.  Patient ID and intended procedure confirmed with present staff. Received instructions for my participation in the procedure from the performing physician.  

## 2016-02-22 NOTE — Op Note (Signed)
[facility] Patient Name: [name] [name] Procedure Date: 02/22/2016 2:07 PM MRN: [id number] Endoscopist: Gatha Mayer , MD Age: 76 Referring MD:  Date of Birth: 12/12/1940 Gender: Male Account #: [id number] Procedure:                Colonoscopy Indications:              High risk colon cancer surveillance: Personal                            history of colon cancer Medicines:                Propofol per Anesthesia, Monitored Anesthesia Care Procedure:                Pre-Anesthesia Assessment:                           - Prior to the procedure, a History and Physical                            was performed, and patient medications and                            allergies were reviewed. The patient's tolerance of                            previous anesthesia was also reviewed. The risks                            and benefits of the procedure and the sedation                            options and risks were discussed with the patient.                            All questions were answered, and informed consent                            was obtained. Prior Anticoagulants: The patient                            last took Plavix (clopidogrel) 5 days and Pletal                            (cilostazol) 5 days prior to the procedure. ASA                            Grade Assessment: III - A patient with severe                            systemic disease. After reviewing the risks and                            benefits, the patient was deemed in satisfactory  condition to undergo the procedure.                           After obtaining informed consent, the colonoscope                            was passed under direct vision. Throughout the                            procedure, the patient's blood pressure, pulse, and                            oxygen saturations were monitored continuously. The                            Model CF-[id number]  scope was introduced                            through the anus and advanced to the the cecum,                            identified by appendiceal orifice and ileocecal                            valve. The colonoscopy was performed without                            difficulty. The patient tolerated the procedure                            well. The quality of the bowel preparation was                            excellent. The ileocecal valve, appendiceal                            orifice, and rectum were photographed. The bowel                            preparation used was Miralax. Scope In: 2:29:51 PM Scope Out: 2:39:19 PM Scope Withdrawal Time: 0 hours 6 minutes 47 seconds  Total Procedure Duration: 0 hours 9 minutes 28 seconds  Findings:                 The perianal and digital rectal examinations were                            normal.                           The entire examined colon appeared normal on direct                            and retroflexion views. Complications:            No immediate complications. Estimated Blood  Loss:     Estimated blood loss: none. Impression:               - The entire examined colon is normal on direct and                            retroflexion views.                           - No specimens collected. Recommendation:           - Patient has a contact number available for                            emergencies. The signs and symptoms of potential                            delayed complications were discussed with the                            patient. Return to normal activities tomorrow.                            Written discharge instructions were provided to the                            patient.                           - Resume previous diet.                           - Continue present medications.                           - Resume Plavix (clopidogrel) today and Pletal                            (cilostazol) today at prior  doses.                           - No repeat colonoscopy due to age. Gatha Mayer, MD 02/22/2016 2:52:37 PM This report has been signed electronically.

## 2016-02-22 NOTE — Progress Notes (Signed)
Pt's states no medical or surgical changes since previsit or office visit. 

## 2016-02-23 ENCOUNTER — Telehealth: Payer: Self-pay | Admitting: *Deleted

## 2016-02-23 DIAGNOSIS — D509 Iron deficiency anemia, unspecified: Secondary | ICD-10-CM | POA: Diagnosis not present

## 2016-02-23 DIAGNOSIS — D631 Anemia in chronic kidney disease: Secondary | ICD-10-CM | POA: Diagnosis not present

## 2016-02-23 DIAGNOSIS — E1129 Type 2 diabetes mellitus with other diabetic kidney complication: Secondary | ICD-10-CM | POA: Diagnosis not present

## 2016-02-23 DIAGNOSIS — Z283 Underimmunization status: Secondary | ICD-10-CM | POA: Diagnosis not present

## 2016-02-23 DIAGNOSIS — N2581 Secondary hyperparathyroidism of renal origin: Secondary | ICD-10-CM | POA: Diagnosis not present

## 2016-02-23 DIAGNOSIS — N186 End stage renal disease: Secondary | ICD-10-CM | POA: Diagnosis not present

## 2016-02-23 NOTE — Telephone Encounter (Signed)
  Follow up Call-  Call back number 02/22/2016  Post procedure Call Back phone  # [phone]  Permission to leave phone message Yes  Some recent data might be hidden    Spoke with wife Patient questions:  Do you have a fever, pain , or abdominal swelling? No. Pain Score  0 *  Have you tolerated food without any problems? Yes.    Have you been able to return to your normal activities? Yes.    Do you have any questions about your discharge instructions: Diet   No. Medications  No. Follow up visit  No.  Do you have questions or concerns about your Care? No.  Actions: * If pain score is 4 or above: No action needed, pain <4.

## 2016-02-27 DIAGNOSIS — I871 Compression of vein: Secondary | ICD-10-CM | POA: Diagnosis not present

## 2016-02-27 DIAGNOSIS — T82868A Thrombosis of vascular prosthetic devices, implants and grafts, initial encounter: Secondary | ICD-10-CM | POA: Diagnosis not present

## 2016-02-27 DIAGNOSIS — Z992 Dependence on renal dialysis: Secondary | ICD-10-CM | POA: Diagnosis not present

## 2016-02-27 DIAGNOSIS — N186 End stage renal disease: Secondary | ICD-10-CM | POA: Diagnosis not present

## 2016-02-27 DIAGNOSIS — D551 Anemia due to other disorders of glutathione metabolism: Secondary | ICD-10-CM | POA: Diagnosis not present

## 2016-02-28 DIAGNOSIS — N2581 Secondary hyperparathyroidism of renal origin: Secondary | ICD-10-CM | POA: Diagnosis not present

## 2016-02-28 DIAGNOSIS — D631 Anemia in chronic kidney disease: Secondary | ICD-10-CM | POA: Diagnosis not present

## 2016-02-28 DIAGNOSIS — Z283 Underimmunization status: Secondary | ICD-10-CM | POA: Diagnosis not present

## 2016-02-28 DIAGNOSIS — N186 End stage renal disease: Secondary | ICD-10-CM | POA: Diagnosis not present

## 2016-02-28 DIAGNOSIS — E1129 Type 2 diabetes mellitus with other diabetic kidney complication: Secondary | ICD-10-CM | POA: Diagnosis not present

## 2016-02-28 DIAGNOSIS — D509 Iron deficiency anemia, unspecified: Secondary | ICD-10-CM | POA: Diagnosis not present

## 2016-03-01 ENCOUNTER — Ambulatory Visit (INDEPENDENT_AMBULATORY_CARE_PROVIDER_SITE_OTHER): Payer: Medicare Other | Admitting: Family Medicine

## 2016-03-01 ENCOUNTER — Encounter: Payer: Self-pay | Admitting: Family Medicine

## 2016-03-01 ENCOUNTER — Telehealth: Payer: Self-pay | Admitting: *Deleted

## 2016-03-01 VITALS — BP 128/51 | HR 69 | Temp 97.8°F | Ht 65.0 in | Wt 141.4 lb

## 2016-03-01 DIAGNOSIS — G8929 Other chronic pain: Secondary | ICD-10-CM

## 2016-03-01 DIAGNOSIS — Z283 Underimmunization status: Secondary | ICD-10-CM | POA: Diagnosis not present

## 2016-03-01 DIAGNOSIS — R51 Headache: Secondary | ICD-10-CM

## 2016-03-01 DIAGNOSIS — N186 End stage renal disease: Secondary | ICD-10-CM | POA: Diagnosis not present

## 2016-03-01 DIAGNOSIS — D509 Iron deficiency anemia, unspecified: Secondary | ICD-10-CM | POA: Diagnosis not present

## 2016-03-01 DIAGNOSIS — N2581 Secondary hyperparathyroidism of renal origin: Secondary | ICD-10-CM | POA: Diagnosis not present

## 2016-03-01 DIAGNOSIS — E1129 Type 2 diabetes mellitus with other diabetic kidney complication: Secondary | ICD-10-CM | POA: Diagnosis not present

## 2016-03-01 DIAGNOSIS — D631 Anemia in chronic kidney disease: Secondary | ICD-10-CM | POA: Diagnosis not present

## 2016-03-01 NOTE — Progress Notes (Signed)
Let him know that the biopsies show inflammation - I think he needs stronger acid blocking - so change pantoprazole to 40 mg daily before breakfast - # 30 or 90 and 25yr refills  If he is not totally better in 2 mos call back if he is just stay on new dose PPI  No letter/recall from [facility]

## 2016-03-01 NOTE — Patient Instructions (Signed)
Seek immediate care if you start experiencing vision changes, dizziness, one sided weakness/numbness/tingling, trouble swallowing, balance issues, or difficulty with speech.

## 2016-03-01 NOTE — Progress Notes (Signed)
Chief Complaint  Patient presents with  . Headache    Pt reports having headaches x4 days and comes and goes. Pt takes tylenol and does not give relief     [name] [name] is a 76 y.o. male here for evaluation of headache.  Duration: 4 days, he has been getting recurrent headaches for years though.  Palliation: Tylenol helps a little Provocation: Nothing Severity: 5 Quality: dull and achy Associated symptoms: no The headaches do wake him up at night. Denies: nausea, vomiting, sonophobia, photophobia, scotomata, vertigo, ataxia, lacrimation, rhinorrhea, weakness, difficulty swallowing, difficulty with speech. Currently with headache? Yes Failed therapies: None  Past Medical History:  Diagnosis Date  . Anemia    Likely secondary to history of GI bleed  . Anginal pain (HCC)    Chronic stable  . Anxiety   . Arthritis   . BPH (benign prostatic hyperplasia)   . CAD (coronary artery disease) of bypass graft 2006, 2012   In 2006: Occluded SVG-OM noted (SVG-diagonal was previously occluded); 2012: Severe lesion in redo SVG-OM1 --> BMS PCI   . CAD in native artery; and the grafts 1992, 1997, 2002, 2006, 2012   CABG 1992 and redo CABG 2006  . CAD S/P percutaneous coronary angioplasty October 2012; Feb, Apr & Oct 2015; Feb 2016   a) s/p CABG 1992 and redo 2006. b) 10/12: NSTEMI: PCI to SVG-OM1 Integrity BMS 3.5 x 15 (3.85 mm) c) 2/15: UA - PCI mid RCA Xience Ap DES 2.75 x 15 (3.1 mm). d) 4/15 : UA - focal dRCA Resolute DES 2.25 x 8 (2.5 mm). e) 10/15: PCI to mRCA ISR w/ post stent lesion - Promus P 2.75 x 16 (3.25 mm). f) 2/16: NSTEMI: CBA/PTCA of  mRCA ISR (3.5 mm post-dilation); g) 7/16 ISR RCA->CBA/PTCA, residual 20%.  . Carotid arterial disease ([place])    a) Duplex 05/2012: R BULB/PROX ICA: 50-69%, L BULB/PROX ICA: 0-49%. ;; 04/29/2014: 70-17% RICA, 79-39% LICA. Patetn vertebrals,  . Chronic low back pain   . Colon cancer [facility]) 2003   colectomy for CA. no recurrence . never required   radiation or chem  . Diabetes mellitus    type -II  . Dyspnea    "when I get too much Fluid"  . End stage renal disease on dialysis [facility])    Dialyzes at [facility]: Tues/Th/Sat - left upper cavity AV fistula brachiocephalic  . Gout   . Heart murmur   . History of Non-ST elevated myocardial infarction (non-STEMI) 1992, 2006, 2012; 02/2013  . Hyperlipidemia   . Hypertension   . Hypothyroidism    On supplementation  . Ischemic cardiomyopathy    a) EF 45-50% by echo 2015. b) EF 50% by cath 04/2014.  Marland Kitchen LBBB (left bundle branch block)    Chronic  . Peptic ulcer disease  2004  . Plavix resistance    a) P2Y12 264 in 02/2014 - put on Brilinta but did not tolerate due to SOB. b) cannot be on Effient due to history of stroke. c) Plavix increased to 75mg  BID and Pletal added 04/2014 due to ISR.  Marland Kitchen Pneumonia   . Skin cancer 02/1999   nose  . Stroke [facility]) 1998   Family History  Problem Relation Age of Onset  . Heart disease Mother   . Hypertension Mother   . Diabetes Mother   . Heart attack Mother   . Heart disease Father   . Hypertension Father   . Diabetes Father   . Heart attack  Father   . Stroke Paternal Aunt   . Stroke Paternal Uncle   . Colon cancer Neg Hx   . Esophageal cancer Neg Hx    Current Meds  Medication Sig  . acetaminophen (TYLENOL) 500 MG tablet Take 1,000 mg by mouth every 6 (six) hours as needed for mild pain.  Marland Kitchen amLODipine (NORVASC) 5 MG tablet Take one tablet on non dialysis day  By mouth.  Marland Kitchen atorvastatin (LIPITOR) 20 MG tablet Take 0.5 tablets (10 mg total) by mouth daily.  Marland Kitchen Besifloxacin HCl (BESIVANCE) 0.6 % SUSP Place 1 drop into both eyes See admin instructions. Use eye drops 4 times daily for 2 days following injection by Dr. Zigmund Daniel (every 10 weeks) Next injection due 02-2016  . carvedilol (COREG) 25 MG tablet TAKE ONE TABLET BY MOUTH TWICE DAILY WITH MEALS  . cilostazol (PLETAL) 100 MG tablet TAKE ONE TABLET BY MOUTH TWICE DAILY  . clonazePAM  (KLONOPIN) 0.5 MG tablet Take 1 tablet (0.5 mg total) by mouth 2 (two) times daily as needed for anxiety.  . clopidogrel (PLAVIX) 75 MG tablet Take 1 tablet (75 mg total) by mouth daily.  Marland Kitchen glucose blood (TRUE METRIX BLOOD GLUCOSE TEST) test strip 1 each by Other route as needed for other. Use as instructed to test sugars 2-3 times daily. Dx. E11.40  . isosorbide mononitrate (IMDUR) 120 MG 24 hr tablet Take 1 tablet (120 mg total) by mouth daily.  Marland Kitchen levothyroxine (SYNTHROID, LEVOTHROID) 125 MCG tablet TAKE ONE TABLET BY MOUTH ONCE DAILY BEFORE BREAKFAST (Patient taking differently: Take 125 mcg by mouth daily before breakfast. )  . lidocaine-prilocaine (EMLA) cream Apply 1 application topically See admin instructions. Apply prior to dialysis treatments on Tuesday, Thursday and Saturday  . loperamide (IMODIUM A-D) 2 MG tablet Take 4 mg by mouth 2 (two) times daily as needed for diarrhea or loose stools.  Marland Kitchen NITROSTAT 0.4 MG SL tablet PLACE 1 TABLET UNDER THE TONGUE EVERY 5 MINUTES AS NEEDED FOR CHEST PAIN (MAX 3 DOSES PER DAY)  . oxyCODONE-acetaminophen (PERCOCET/ROXICET) 5-325 MG tablet Take 1 tablet by mouth as needed.  . pantoprazole (PROTONIX) 20 MG tablet Take 1 tablet (20 mg total) by mouth daily.  . sevelamer carbonate (RENVELA) 800 MG tablet Take 2,400-3,200 mg by mouth See admin instructions. Take 4 tablets (3200 mg) with meals and 3 tablets (2400 mg) with snacks  . sucralfate (CARAFATE) 1 g tablet Take 1 tablet (1 g total) by mouth 4 (four) times daily -  with meals and at bedtime.    BP (!) 128/51 (BP Location: Right Arm, Patient Position: Sitting, Cuff Size: Small)   Pulse 69   Temp 97.8 F (36.6 C) (Oral)   Ht 5\' 5"  (1.651 m)   Wt 141 lb 6.4 oz (64.1 kg)   SpO2 97%   BMI 23.53 kg/m  General: awake, alert, appearing stated age Eyes: PERRLA, EOMi Heart: RRR, no murmurs, no bruits Lungs: CTAB, no accessory muscle use Neuro: CN 2-12 intact, no cerebellar signs, DTR's equal and  symmetry, no clonus MSK: 5/5 strength throughout, normal gait, no TTP over posterior cervical triangle or paraspinal cervical musculature Psych: Age appropriate judgment and insight, mood and affect normal  Chronic nonintractable headache, unspecified headache type - Plan: MR Brain Wo Contrast  Orders as above. Given nighttime awakenings, will order imaging to r/o anything sinister.  Warning signs/symptoms on when to seek emergent care discussed with patient and wife.  Follow up pending above. The patient and his wife  voiced understanding and agreement to the plan.  [place], [place] 3:09 PM 03/01/16

## 2016-03-01 NOTE — Progress Notes (Signed)
Pre visit review using our clinic review tool, if applicable. No additional management support is needed unless otherwise documented below in the visit note. 

## 2016-03-01 NOTE — Telephone Encounter (Signed)
I agree, pt needs evaluation rather than just ordering imaging as he has a very complicated cardiac/vascular/renal history.  I don't want him waiting until tomorrow but unfortunately I'm out of the office this afternoon- hence the appt w/ Dr Nani Ravens.

## 2016-03-01 NOTE — Telephone Encounter (Signed)
Spoke with pt he advised that he has been having headaches on and off for the past few months. He has been having a consistent headache for the past 4 days. Pt was given percocet from vascular provider earlier this week. This is taking the edge off, but not making the headache go away completely.   Pt denying: SOB, dizziness, or any numbness in extremities.   Pt is not looking for pain med he is wondering if he needs imaging to verify a cause of these headaches. Due to PCP not having any afternoon appointments, pt was scheduled with Dr. Nani Ravens at [facility].

## 2016-03-01 NOTE — Telephone Encounter (Signed)
Patient called and asked to speak to [place].  I asked if there was something I could help him with and he said no he wanted to talk to her.   I asked if I could send her a message and he said "tell her to call me"    Asked if I could give her more details and he would only tell me that he had a headache.   Asked if he had taken anything and he said "tylenol" - asked again if I could just have [place] call him because he wants medicine or a scan.

## 2016-03-02 ENCOUNTER — Other Ambulatory Visit: Payer: Self-pay

## 2016-03-02 DIAGNOSIS — R3912 Poor urinary stream: Secondary | ICD-10-CM | POA: Diagnosis not present

## 2016-03-02 MED ORDER — PANTOPRAZOLE SODIUM 40 MG PO TBEC: 40.0000 mg | DELAYED_RELEASE_TABLET | Freq: Every day | ORAL | 3 refills | Status: DC

## 2016-03-03 ENCOUNTER — Ambulatory Visit (HOSPITAL_BASED_OUTPATIENT_CLINIC_OR_DEPARTMENT_OTHER)
Admission: RE | Admit: 2016-03-03 | Discharge: 2016-03-03 | Disposition: A | Discharge status: Home / Self Care | Payer: Medicare Other | Source: Ambulatory Visit | Attending: Family Medicine | Admitting: Family Medicine

## 2016-03-03 DIAGNOSIS — I6782 Cerebral ischemia: Secondary | ICD-10-CM | POA: Insufficient documentation

## 2016-03-03 DIAGNOSIS — N186 End stage renal disease: Secondary | ICD-10-CM | POA: Diagnosis not present

## 2016-03-03 DIAGNOSIS — R519 Headache, unspecified: Secondary | ICD-10-CM

## 2016-03-03 DIAGNOSIS — N2581 Secondary hyperparathyroidism of renal origin: Secondary | ICD-10-CM | POA: Diagnosis not present

## 2016-03-03 DIAGNOSIS — Z283 Underimmunization status: Secondary | ICD-10-CM | POA: Diagnosis not present

## 2016-03-03 DIAGNOSIS — J323 Chronic sphenoidal sinusitis: Secondary | ICD-10-CM | POA: Diagnosis not present

## 2016-03-03 DIAGNOSIS — D631 Anemia in chronic kidney disease: Secondary | ICD-10-CM | POA: Diagnosis not present

## 2016-03-03 DIAGNOSIS — D509 Iron deficiency anemia, unspecified: Secondary | ICD-10-CM | POA: Diagnosis not present

## 2016-03-03 DIAGNOSIS — R51 Headache: Secondary | ICD-10-CM | POA: Diagnosis not present

## 2016-03-03 DIAGNOSIS — E1129 Type 2 diabetes mellitus with other diabetic kidney complication: Secondary | ICD-10-CM | POA: Diagnosis not present

## 2016-03-06 DIAGNOSIS — E1129 Type 2 diabetes mellitus with other diabetic kidney complication: Secondary | ICD-10-CM | POA: Diagnosis not present

## 2016-03-06 DIAGNOSIS — N2581 Secondary hyperparathyroidism of renal origin: Secondary | ICD-10-CM | POA: Diagnosis not present

## 2016-03-06 DIAGNOSIS — D509 Iron deficiency anemia, unspecified: Secondary | ICD-10-CM | POA: Diagnosis not present

## 2016-03-06 DIAGNOSIS — N186 End stage renal disease: Secondary | ICD-10-CM | POA: Diagnosis not present

## 2016-03-06 DIAGNOSIS — Z283 Underimmunization status: Secondary | ICD-10-CM | POA: Diagnosis not present

## 2016-03-06 DIAGNOSIS — D631 Anemia in chronic kidney disease: Secondary | ICD-10-CM | POA: Diagnosis not present

## 2016-03-07 DIAGNOSIS — N186 End stage renal disease: Secondary | ICD-10-CM | POA: Diagnosis not present

## 2016-03-07 DIAGNOSIS — Z992 Dependence on renal dialysis: Secondary | ICD-10-CM | POA: Diagnosis not present

## 2016-03-07 DIAGNOSIS — E1129 Type 2 diabetes mellitus with other diabetic kidney complication: Secondary | ICD-10-CM | POA: Diagnosis not present

## 2016-03-08 DIAGNOSIS — D509 Iron deficiency anemia, unspecified: Secondary | ICD-10-CM | POA: Diagnosis not present

## 2016-03-08 DIAGNOSIS — D631 Anemia in chronic kidney disease: Secondary | ICD-10-CM | POA: Diagnosis not present

## 2016-03-08 DIAGNOSIS — N186 End stage renal disease: Secondary | ICD-10-CM | POA: Diagnosis not present

## 2016-03-08 DIAGNOSIS — E876 Hypokalemia: Secondary | ICD-10-CM | POA: Diagnosis not present

## 2016-03-08 DIAGNOSIS — E1129 Type 2 diabetes mellitus with other diabetic kidney complication: Secondary | ICD-10-CM | POA: Diagnosis not present

## 2016-03-08 DIAGNOSIS — N2581 Secondary hyperparathyroidism of renal origin: Secondary | ICD-10-CM | POA: Diagnosis not present

## 2016-03-10 DIAGNOSIS — D631 Anemia in chronic kidney disease: Secondary | ICD-10-CM | POA: Diagnosis not present

## 2016-03-10 DIAGNOSIS — E876 Hypokalemia: Secondary | ICD-10-CM | POA: Diagnosis not present

## 2016-03-10 DIAGNOSIS — N186 End stage renal disease: Secondary | ICD-10-CM | POA: Diagnosis not present

## 2016-03-10 DIAGNOSIS — N2581 Secondary hyperparathyroidism of renal origin: Secondary | ICD-10-CM | POA: Diagnosis not present

## 2016-03-10 DIAGNOSIS — E1129 Type 2 diabetes mellitus with other diabetic kidney complication: Secondary | ICD-10-CM | POA: Diagnosis not present

## 2016-03-10 DIAGNOSIS — D509 Iron deficiency anemia, unspecified: Secondary | ICD-10-CM | POA: Diagnosis not present

## 2016-03-13 DIAGNOSIS — E876 Hypokalemia: Secondary | ICD-10-CM | POA: Diagnosis not present

## 2016-03-13 DIAGNOSIS — D631 Anemia in chronic kidney disease: Secondary | ICD-10-CM | POA: Diagnosis not present

## 2016-03-13 DIAGNOSIS — E1129 Type 2 diabetes mellitus with other diabetic kidney complication: Secondary | ICD-10-CM | POA: Diagnosis not present

## 2016-03-13 DIAGNOSIS — N186 End stage renal disease: Secondary | ICD-10-CM | POA: Diagnosis not present

## 2016-03-13 DIAGNOSIS — D509 Iron deficiency anemia, unspecified: Secondary | ICD-10-CM | POA: Diagnosis not present

## 2016-03-13 DIAGNOSIS — N2581 Secondary hyperparathyroidism of renal origin: Secondary | ICD-10-CM | POA: Diagnosis not present

## 2016-03-15 DIAGNOSIS — E1129 Type 2 diabetes mellitus with other diabetic kidney complication: Secondary | ICD-10-CM | POA: Diagnosis not present

## 2016-03-15 DIAGNOSIS — D509 Iron deficiency anemia, unspecified: Secondary | ICD-10-CM | POA: Diagnosis not present

## 2016-03-15 DIAGNOSIS — N186 End stage renal disease: Secondary | ICD-10-CM | POA: Diagnosis not present

## 2016-03-15 DIAGNOSIS — E876 Hypokalemia: Secondary | ICD-10-CM | POA: Diagnosis not present

## 2016-03-15 DIAGNOSIS — N2581 Secondary hyperparathyroidism of renal origin: Secondary | ICD-10-CM | POA: Diagnosis not present

## 2016-03-15 DIAGNOSIS — D631 Anemia in chronic kidney disease: Secondary | ICD-10-CM | POA: Diagnosis not present

## 2016-03-17 DIAGNOSIS — D631 Anemia in chronic kidney disease: Secondary | ICD-10-CM | POA: Diagnosis not present

## 2016-03-17 DIAGNOSIS — N186 End stage renal disease: Secondary | ICD-10-CM | POA: Diagnosis not present

## 2016-03-17 DIAGNOSIS — E1129 Type 2 diabetes mellitus with other diabetic kidney complication: Secondary | ICD-10-CM | POA: Diagnosis not present

## 2016-03-17 DIAGNOSIS — D509 Iron deficiency anemia, unspecified: Secondary | ICD-10-CM | POA: Diagnosis not present

## 2016-03-17 DIAGNOSIS — E876 Hypokalemia: Secondary | ICD-10-CM | POA: Diagnosis not present

## 2016-03-17 DIAGNOSIS — N2581 Secondary hyperparathyroidism of renal origin: Secondary | ICD-10-CM | POA: Diagnosis not present

## 2016-03-20 ENCOUNTER — Telehealth: Payer: Self-pay | Admitting: Cardiology

## 2016-03-20 DIAGNOSIS — E876 Hypokalemia: Secondary | ICD-10-CM | POA: Diagnosis not present

## 2016-03-20 DIAGNOSIS — N2581 Secondary hyperparathyroidism of renal origin: Secondary | ICD-10-CM | POA: Diagnosis not present

## 2016-03-20 DIAGNOSIS — D509 Iron deficiency anemia, unspecified: Secondary | ICD-10-CM | POA: Diagnosis not present

## 2016-03-20 DIAGNOSIS — E1129 Type 2 diabetes mellitus with other diabetic kidney complication: Secondary | ICD-10-CM | POA: Diagnosis not present

## 2016-03-20 DIAGNOSIS — D631 Anemia in chronic kidney disease: Secondary | ICD-10-CM | POA: Diagnosis not present

## 2016-03-20 DIAGNOSIS — N186 End stage renal disease: Secondary | ICD-10-CM | POA: Diagnosis not present

## 2016-03-20 NOTE — Telephone Encounter (Signed)
Pt of Dr. Ellyn Hack f/u on 3/21  Spoke to patient, who identifies concern that his BP is dropping during dialysis. systolic dropping to 16O-37G on tue, thurs, sat, during dialysis (will start at normal range, around 902-111 systolic)  Runs "on the low side" on non-dialysis days. 110/systolic  Patient informs me he held amlodipine last night, takes this med in the evening and has basically gone to EOD 4 days a week administration, skipping the nights before dialysis.  I told him due to the way the amlodipine works, we may just want to strategize with a lower dose to take once daily rather than doing alternating doses.  Will seek physician recommendation and call him back. Patient voiced understanding and thanks.

## 2016-03-20 NOTE — Telephone Encounter (Signed)
[name] [name] is calling to speak with someone about his blood pressure medication . His blood pressure is running low .Marland Kitchen Please call   Thanks

## 2016-03-21 ENCOUNTER — Other Ambulatory Visit: Payer: Self-pay | Admitting: Vascular Surgery

## 2016-03-21 DIAGNOSIS — T82510S Breakdown (mechanical) of surgically created arteriovenous fistula, sequela: Secondary | ICD-10-CM

## 2016-03-21 MED ORDER — AMLODIPINE BESYLATE 5 MG PO TABS: ORAL_TABLET | ORAL | 3 refills | Status: DC

## 2016-03-21 NOTE — Telephone Encounter (Signed)
What we should do is just go to lower standing dose of amlodipine which would be 2.5 mg. If he still has low blood pressures during dialysis, then I would simply just stop it. The problem is that I think is helping his angina.  [name], MD

## 2016-03-21 NOTE — Telephone Encounter (Signed)
Spoke to patient. Information give to take 0.5 tablet ( 1/2 of 5 mg) on non-dialysis days. Continue blood pressure reading. Keep appointment for next week. Verbalized understanding.

## 2016-03-22 ENCOUNTER — Encounter: Payer: Self-pay | Admitting: Vascular Surgery

## 2016-03-22 DIAGNOSIS — N2581 Secondary hyperparathyroidism of renal origin: Secondary | ICD-10-CM | POA: Diagnosis not present

## 2016-03-22 DIAGNOSIS — N186 End stage renal disease: Secondary | ICD-10-CM | POA: Diagnosis not present

## 2016-03-22 DIAGNOSIS — D509 Iron deficiency anemia, unspecified: Secondary | ICD-10-CM | POA: Diagnosis not present

## 2016-03-22 DIAGNOSIS — D631 Anemia in chronic kidney disease: Secondary | ICD-10-CM | POA: Diagnosis not present

## 2016-03-22 DIAGNOSIS — E1129 Type 2 diabetes mellitus with other diabetic kidney complication: Secondary | ICD-10-CM | POA: Diagnosis not present

## 2016-03-22 DIAGNOSIS — E876 Hypokalemia: Secondary | ICD-10-CM | POA: Diagnosis not present

## 2016-03-24 DIAGNOSIS — N2581 Secondary hyperparathyroidism of renal origin: Secondary | ICD-10-CM | POA: Diagnosis not present

## 2016-03-24 DIAGNOSIS — N186 End stage renal disease: Secondary | ICD-10-CM | POA: Diagnosis not present

## 2016-03-26 ENCOUNTER — Ambulatory Visit (HOSPITAL_COMMUNITY)
Admission: RE | Admit: 2016-03-26 | Discharge: 2016-03-26 | Disposition: A | Discharge status: Home / Self Care | Payer: Medicare Other | Source: Ambulatory Visit | Attending: Surgery | Admitting: Surgery

## 2016-03-26 DIAGNOSIS — Y832 Surgical operation with anastomosis, bypass or graft as the cause of abnormal reaction of the patient, or of later complication, without mention of misadventure at the time of the procedure: Secondary | ICD-10-CM | POA: Diagnosis not present

## 2016-03-26 DIAGNOSIS — M79642 Pain in left hand: Secondary | ICD-10-CM | POA: Insufficient documentation

## 2016-03-26 DIAGNOSIS — T82510S Breakdown (mechanical) of surgically created arteriovenous fistula, sequela: Secondary | ICD-10-CM

## 2016-03-27 DIAGNOSIS — E876 Hypokalemia: Secondary | ICD-10-CM | POA: Diagnosis not present

## 2016-03-27 DIAGNOSIS — E1129 Type 2 diabetes mellitus with other diabetic kidney complication: Secondary | ICD-10-CM | POA: Diagnosis not present

## 2016-03-27 DIAGNOSIS — N186 End stage renal disease: Secondary | ICD-10-CM | POA: Diagnosis not present

## 2016-03-27 DIAGNOSIS — D509 Iron deficiency anemia, unspecified: Secondary | ICD-10-CM | POA: Diagnosis not present

## 2016-03-27 DIAGNOSIS — N2581 Secondary hyperparathyroidism of renal origin: Secondary | ICD-10-CM | POA: Diagnosis not present

## 2016-03-27 DIAGNOSIS — D631 Anemia in chronic kidney disease: Secondary | ICD-10-CM | POA: Diagnosis not present

## 2016-03-28 ENCOUNTER — Ambulatory Visit (INDEPENDENT_AMBULATORY_CARE_PROVIDER_SITE_OTHER): Payer: Medicare Other | Admitting: Cardiology

## 2016-03-28 ENCOUNTER — Encounter: Payer: Self-pay | Admitting: Cardiology

## 2016-03-28 VITALS — BP 92/54 | HR 70 | Ht 65.0 in | Wt 145.0 lb

## 2016-03-28 DIAGNOSIS — I208 Other forms of angina pectoris: Secondary | ICD-10-CM | POA: Diagnosis not present

## 2016-03-28 DIAGNOSIS — T82855D Stenosis of coronary artery stent, subsequent encounter: Secondary | ICD-10-CM

## 2016-03-28 DIAGNOSIS — I25119 Atherosclerotic heart disease of native coronary artery with unspecified angina pectoris: Secondary | ICD-10-CM | POA: Diagnosis not present

## 2016-03-28 DIAGNOSIS — E785 Hyperlipidemia, unspecified: Secondary | ICD-10-CM

## 2016-03-28 DIAGNOSIS — I214 Non-ST elevation (NSTEMI) myocardial infarction: Secondary | ICD-10-CM | POA: Diagnosis not present

## 2016-03-28 DIAGNOSIS — I5042 Chronic combined systolic (congestive) and diastolic (congestive) heart failure: Secondary | ICD-10-CM | POA: Diagnosis not present

## 2016-03-28 DIAGNOSIS — I953 Hypotension of hemodialysis: Secondary | ICD-10-CM

## 2016-03-28 MED ORDER — CARVEDILOL 25 MG PO TABS: 12.5000 mg | ORAL_TABLET | Freq: Two times a day (BID) | ORAL | 1 refills | Status: DC

## 2016-03-28 NOTE — Assessment & Plan Note (Signed)
Hypotensive today. At this point we really don't have much more room since amlodipine has helped his angina. We'll then need to just reduce the dose of beta blocker. Plan: Reduce Coreg  To 12.5 mg twice a day - if he remains hypotensive on dialysis days we can either have him hold the dose on the night before dialysis versus take a half a dose.

## 2016-03-28 NOTE — Assessment & Plan Note (Signed)
No true heart failure symptoms of PND and orthopnea but when he has not had dialysis he does have some orthopnea with angina decubitus. 2 mm limited by his hypotension. Only on 25 twice a day of carvedilol plus low-dose amlodipine for antianginal effect along with Imdur. With his hypotension I'm actually reducing the beta blocker dose.  - He has not had evaluation of his EF in quite a while. We may want to recheck if his aortic murmur becomes ladder.

## 2016-03-28 NOTE — Assessment & Plan Note (Addendum)
Multiple non-ST elevation MIs in the past but has maintained relatively preserved EF with minor reduction.

## 2016-03-28 NOTE — Assessment & Plan Note (Signed)
Status post pretty aggressive angioplasty - with the last case being July 2016 he has not had that level of angina since that procedure. He did have some significant in-stent restenosis, therefore I would be reluctant to stop his Plavix which he has been somewhat resistant to. We are only using 75 mg a day because he had some bleeding issues, therefore he is on Pletal as well.

## 2016-03-28 NOTE — Patient Instructions (Signed)
Medication Instructions:  DECREASE Coreg to 12.5mg  Take half tab of 25mg  twice a day IF BLOOD PRESSURE IS STILL LOW ON DIALYSIS DAYS DO NOT TAKE NIGHT TIME DOSE  Labwork: None   Testing/Procedures: None   Follow-Up: Your physician recommends that you schedule a follow-up appointment in: 3-4 MONTHS WITH DR HARDING.  Any Other Special Instructions Will Be Listed Below (If Applicable).  If you need a refill on your cardiac medications before your next appointment, please call your pharmacy.

## 2016-03-28 NOTE — Assessment & Plan Note (Signed)
Last lipid panel showed fairly well controlled levels on his current dose of atorvastatin (10 mg day). With his weight loss, we have been reducing the dose. He should be due to have labs checked, but as well as a been controlled, we can really recheck levels in August when he sees his PCP.

## 2016-03-28 NOTE — Progress Notes (Signed)
PCP: Annye Asa, MD  Clinic Note: Chief Complaint  Patient presents with  . Follow-up    3 MONTHS -CAD with chronic angina  . Shortness of Breath  . Headache    HPI: MCKAY TEGTMEYER is a 76 y.o. male with a PMH below who presents today for 3 month follow-up for his chronic CAD-CABG-multiple PCIS of the RCA. He has recurrent anginal symptoms with in-stent restenosis in the RCA. The RCA is very difficult to do PCI requiring Guideliner catheter. He has end-stage renal disease on hemodialysis. He is artery had redo CABG, and is not a re-redo Candidate Last catheterization and PTCA to RCA was in July 2016.[name] was last seen in December 2017. Was still doing relatively well from a nitroglycerin standpoint using maybe 3 or 4 tablets a week.  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 The most notable thing for him recently has been hypotension exacerbated by dialysis. We have backed off on his amlodipine dose and only taking it on the evening after dialysis. Despite this, it for the last 2 or 3 episodes of dialysis he has not been out of a full volume removal because of hypotension. As such, he tends to have a little bit more angina decubitus if symptoms when the volume status is such that he is not at his dry weight. Despite that, he still seems to have pretty well-controlled angina at this point may be taking 1 or 2 nitroglycerin tablets a week as opposed to a day previously. Although his angina is well controlled, his functional capacity has diminished greatly. He really is limited to shuffling around the house, but not able to do much deformity of significant activity. A lot of that has to do with significant pain in his right hip and other joints, but it is also just related to just  generalized weakness. His overall weight is significantly reduced indicative of some protein calorie malnutrition from chronic illness. He did notice that being on amlodipine made a significant difference to his angina and therefore would like to stay on the amlodipine. He is trying to do some walking, but as mentioned is limited by hip pain.  No PND - buy + orthopnea (with angina decubitus). No edema. No palpitations, - he does have some orthostatic lightheadedness, dizziness, but no generalized weakness, or syncope/near syncope. No TIA/amaurosis fugax symptoms. No melena, hematochezia, hematuria, or epstaxis. No claudication.  ROS: A comprehensive was performed.  Was noted to be anemic on his recent lab work from dialysis. Has been intermittently on iron. Review of Systems  Constitutional: Negative for malaise/fatigue (bettter energy).  HENT: Negative for congestion, nosebleeds and sinus pain.   Eyes: Negative.  Negative for blurred vision.  Respiratory: Negative for cough, shortness of breath and wheezing.   Cardiovascular:       Per HPI  Gastrointestinal: Positive for diarrhea (constant loos stools since colectomy). Negative for blood in stool and melena.  Genitourinary: Positive for dysuria. Negative for hematuria.       Seeing Urologist - unable to urinate (painful)  - had UTI. completed Abx  Musculoskeletal: Positive for back pain and joint pain (R hip & knees / ankles).  Skin:       S/p skin Ca removal on nose - now healing  Neurological: Positive for dizziness (  in AM when wakes up) and headaches. Negative for tingling, focal weakness and weakness.  Psychiatric/Behavioral: Positive for memory loss. Negative for depression. The patient is not nervous/anxious and does not have insomnia (occasionally uses Melatonin).   All other systems reviewed and are negative.   Past Medical History:  Diagnosis Date  . Anemia    Likely secondary to history of GI bleed  . Anginal pain (HCC)      Chronic stable  . Anxiety   . Arthritis   . BPH (benign prostatic hyperplasia)   . CAD (coronary artery disease) of bypass graft 2006, 2012   In 2006: Occluded SVG-OM noted (SVG-diagonal was previously occluded); 2012: Severe lesion in redo SVG-OM1 --> BMS PCI   . CAD in native artery; and the grafts 1992, 1997, 2002, 2006, 2012   CABG 1992 and redo CABG 2006  . CAD S/P percutaneous coronary angioplasty October 2012; Feb, Apr & Oct 2015; Feb 2016   a) s/p CABG 1992 and redo 2006. b) 10/12: NSTEMI: PCI to SVG-OM1 Integrity BMS 3.5 x 15 (3.85 mm) c) 2/15: UA - PCI mid RCA Xience Ap DES 2.75 x 15 (3.1 mm). d) 4/15 : UA - focal dRCA Resolute DES 2.25 x 8 (2.5 mm). e) 10/15: PCI to mRCA ISR w/ post stent lesion - Promus P 2.75 x 16 (3.25 mm). f) 2/16: NSTEMI: CBA/PTCA of  mRCA ISR (3.5 mm post-dilation); g) 7/16 ISR RCA->CBA/PTCA, residual 20%.  . Carotid arterial disease ([place])    a) Duplex 05/2012: R BULB/PROX ICA: 50-69%, L BULB/PROX ICA: 0-49%. ;; 04/29/2014: 22-97% RICA, 98-92% LICA. Patetn vertebrals,  . Chronic low back pain   . Colon cancer [facility]) 2003   colectomy for CA. no recurrence . never required  radiation or chem  . Diabetes mellitus    type -II  . Dyspnea    "when I get too much Fluid"  . End stage renal disease on dialysis [facility])    Dialyzes at [facility]: Tues/Th/Sat - left upper cavity AV fistula brachiocephalic  . Gout   . Heart murmur   . History of Non-ST elevated myocardial infarction (non-STEMI) 1992, 2006, 2012; 02/2013  . Hyperlipidemia   . Hypertension   . Hypothyroidism    On supplementation  . Ischemic cardiomyopathy    a) EF 45-50% by echo 2015. b) EF 50% by cath 04/2014.  Marland Kitchen LBBB (left bundle branch block)    Chronic  . Peptic ulcer disease  2004  . Plavix resistance    a) P2Y12 264 in 02/2014 - put on Brilinta but did not tolerate due to SOB. b) cannot be on Effient due to history of stroke. c) Plavix increased to 75mg  BID and Pletal added 04/2014 due to  ISR.  Marland Kitchen Pneumonia   . Skin cancer 02/1999   nose  . Stroke [facility]) 1998    Past Surgical History:  Procedure Laterality Date  . APPENDECTOMY    . AV FISTULA REPAIR Left 05/2009  . CARDIAC CATHETERIZATION  10/16/2010   patent  LIMA to the LAD , PATENT  svg TO 2nd marginals ,occluded PLA with collaterals and SVG to the OM  had 90% stenosis  was txlge  bare - metal  3.5  Integrity ten  postdilate  36-37  mm    . CARDIAC CATHETERIZATION  03/22/2004   loss of both SVG,severe disease in prox and ostial  circ not amenable to invention. mod diease distal LAD  AFTER BYPASS GRAFT;-CVTS to evaluate   .  CARDIAC CATHETERIZATION N/A 08/03/2014   Procedure: Left Heart Cath and Cors/Grafts Angiography;  Surgeon: Leonie Man, MD;  Location: [place] CV LAB;  Service: Cardiovascular;  Laterality: N/A;  . CARDIAC CATHETERIZATION N/A 08/03/2014   Procedure: Coronary Balloon Angioplasty;  Surgeon: Leonie Man, MD;  Location: [place] CV LAB;  Service: Cardiovascular;: Aggressive Cutting Balloon - [place] Balloon PTCA of ISR site in mRCA (3 stent layer)  . CAROTID DOPPLER  05/23/2012   ABN CAROTID-- RGT BULB/PROX ICA mild to mod 50-60%;lft bulb/prox mild to mod 0-49%;left subclavian abn waveforms consistent with patients lft arm A/V fistula  . CATARACT EXTRACTION W/ INTRAOCULAR LENS  IMPLANT, BILATERAL Bilateral   . CHOLECYSTECTOMY  2009  . COLON RESECTION  2003   transverse and proximal descending w/ primar anastomosis  . COLON SURGERY  2003   "cancer"  . COLONOSCOPY    . CORONARY ANGIOPLASTY  04/03/1995   OM and Circ  . CORONARY ANGIOPLASTY  02/01/2000   CIRC  . CORONARY ANGIOPLASTY  02/09/14   Cutting Balloon PTCA of mRCA ISR 99% - 3.5 mm  . CORONARY ARTERY BYPASS GRAFT  08/22/1990   INITIAL:LIMA to LAD, SVG to  Diagonal, SVG to OM  . CORONARY ARTERY BYPASS GRAFT  2006   SVG to OM1,SVG to OM2 with patent LIMA  to LAD and occluded vein  graft to the diagonal  and occluded vein grft to OM from   prior  surgery  . DOPPLER ECHOCARDIOGRAPHY  01/25/2012   EF 50 to 55%  . EVENT MONITOR  01/23/2012-02/06/2012   SINUS ,LBBB,unifocal PVCs  . LEFT AND RIGHT HEART CATHETERIZATION WITH CORONARY/GRAFT ANGIOGRAM N/A 04/20/2014   Procedure: LEFT AND RIGHT HEART CATHETERIZATION WITH Beatrix Fetters;  Surgeon: Sherren Mocha, MD;  Location: [facility] CATH LAB;  Service: Cardiovascular;  Laterality: N/A;  . LEFT HEART CATHETERIZATION WITH CORONARY ANGIOGRAM N/A 02/23/2013   Procedure: LEFT HEART CATHETERIZATION WITH CORONARY ANGIOGRAM;  Surgeon: Lorretta Harp, MD;  Location: [facility] CATH LAB;  Service: Cardiovascular;  Laterality: N/A;  . LEFT HEART CATHETERIZATION WITH CORONARY/GRAFT ANGIOGRAM N/A 04/30/2013   Procedure: LEFT HEART CATHETERIZATION WITH Beatrix Fetters;  Surgeon: Leonie Man, MD;  Location: [facility] CATH LAB;  Service: Cardiovascular;  Laterality: N/A;  . LEFT HEART CATHETERIZATION WITH CORONARY/GRAFT ANGIOGRAM N/A 10/15/2013   Procedure: LEFT HEART CATHETERIZATION WITH Beatrix Fetters;  Surgeon: Leonie Man, MD;  Location: [facility] CATH LAB;  Service: Cardiovascular;  Laterality: N/A;  . LEFT HEART CATHETERIZATION WITH CORONARY/GRAFT ANGIOGRAM N/A 02/09/2014   Procedure: LEFT HEART CATHETERIZATION WITH Beatrix Fetters;  Surgeon: Leonie Man, MD;  Location[facility] CATH LAB;  Service: Cardiovascular;  Laterality: N/A;  . Lower Extremity Arterial Dopplers  October 2014   Calcified but non-occlusive peripheral arteries. No evidence of stenosis  . NM MYOCAR PERF WALL MOTION  10/12/2011   EF 54%,LV normal ; no signifiant ischemia  . PERCUTANEOUS CORONARY STENT INTERVENTION (PCI-S)  02/23/2013   mid RCA 80% & 70% - Xience 2.75 mm x 15 mm ( 3.71mm) ; LIMA-LAD patent, SVG-OM patent (stent patent); SVG-Diag patent.  Marland Kitchen PERCUTANEOUS CORONARY STENT INTERVENTION (PCI-S)  April 2015   dRCA - Integrity Resolute DES   2.25 mm x 64mm (2.5 mm)  . PERCUTANEOUS CORONARY STENT INTERVENTION  (PCI-S)  Oct 15 2013   [place] Angina: mRCA ISR with post-stent stenosis -- [place] PTCA & PCI Promus Premier DES 2.75 mm x 16 mm (3.25 mm)  . POSTERIOR LUMBAR FUSION    . REVISON  OF ARTERIOVENOUS FISTULA Left 99/24/2683   Procedure: PLICATION OF LEFT UPPER ARM  ARTERIOVENOUS FISTULA  ANEURYSM;  Surgeon: Rosetta Posner, MD;  Location: [place];  Service: Vascular;  Laterality: Left;  . TRANSTHORACIC ECHOCARDIOGRAM  02/23/2013   Moderate concentric hypertrophy. EF 4500%. Septal bounce. Grade 2 diastolic dysfunction (pseudo-normal - severely elevated filling pressures) mild to moderate MR and moderate LA dilation to    Current Meds  Medication Sig  . acetaminophen (TYLENOL) 500 MG tablet Take 1,000 mg by mouth every 6 (six) hours as needed for mild pain.  Marland Kitchen amLODipine (NORVASC) 5 MG tablet Take 0.5 tablet on non dialysis day  By mouth.  . Besifloxacin HCl (BESIVANCE) 0.6 % SUSP Place 1 drop into both eyes See admin instructions. Use eye drops 4 times daily for 2 days following injection by Dr. Zigmund Daniel (every 10 weeks) Next injection due 02-2016  . carvedilol (COREG) 25 MG tablet Take 0.5 tablets (12.5 mg total) by mouth 2 (two) times daily with a meal.  . cilostazol (PLETAL) 100 MG tablet TAKE ONE TABLET BY MOUTH TWICE DAILY  . clonazePAM (KLONOPIN) 0.5 MG tablet Take 1 tablet (0.5 mg total) by mouth 2 (two) times daily as needed for anxiety.  . clopidogrel (PLAVIX) 75 MG tablet Take 1 tablet (75 mg total) by mouth daily.  Marland Kitchen glucose blood (TRUE METRIX BLOOD GLUCOSE TEST) test strip 1 each by Other route as needed for other. Use as instructed to test sugars 2-3 times daily. Dx. E11.40  . isosorbide mononitrate (IMDUR) 120 MG 24 hr tablet Take 1 tablet (120 mg total) by mouth daily.  Marland Kitchen levothyroxine (SYNTHROID, LEVOTHROID) 125 MCG tablet TAKE ONE TABLET BY MOUTH ONCE DAILY BEFORE BREAKFAST (Patient taking differently: Take 125 mcg by mouth daily before breakfast. )  . lidocaine-prilocaine (EMLA) cream  Apply 1 application topically See admin instructions. Apply prior to dialysis treatments on Tuesday, Thursday and Saturday  . loperamide (IMODIUM A-D) 2 MG tablet Take 4 mg by mouth 2 (two) times daily as needed for diarrhea or loose stools.  Marland Kitchen NITROSTAT 0.4 MG SL tablet PLACE 1 TABLET UNDER THE TONGUE EVERY 5 MINUTES AS NEEDED FOR CHEST PAIN (MAX 3 DOSES PER DAY)  . oxyCODONE-acetaminophen (PERCOCET/ROXICET) 5-325 MG tablet Take 1 tablet by mouth as needed.  . pantoprazole (PROTONIX) 40 MG tablet Take 1 tablet (40 mg total) by mouth daily.  . sevelamer carbonate (RENVELA) 800 MG tablet Take 2,400-3,200 mg by mouth See admin instructions. Take 4 tablets (3200 mg) with meals and 3 tablets (2400 mg) with snacks  . sucralfate (CARAFATE) 1 g tablet Take 1 tablet (1 g total) by mouth 4 (four) times daily -  with meals and at bedtime.  . [DISCONTINUED] carvedilol (COREG) 25 MG tablet TAKE ONE TABLET BY MOUTH TWICE DAILY WITH MEALS   Current Facility-Administered Medications for the 03/28/16 encounter (Office Visit) with Leonie Man, MD  Medication  . 0.9 %  sodium chloride infusion  --  takes amlodipine on the night of his dialysis so it lasts all day on the off day  Allergies  Allergen Reactions  . Brilinta [Ticagrelor] Shortness Of Breath  . Shellfish Allergy Other (See Comments)    Causes gout flare-ups    Social History   Social History  . Marital status: Married    Spouse name: N/A  . Number of children: 3  . Years of education: N/A   Social History Main Topics  . Smoking status: Never Smoker  . Smokeless  tobacco: Never Used  . Alcohol use No  . Drug use: No  . Sexual activity: No   Other Topics Concern  . None   Social History Narrative   Long-term patient of Dr. Rollene Fare.   Married father of 14, grandfather 53.   Never smoked.   Not exercising D2 hip and back pain & now Angina    family history includes Diabetes in his father and mother; Heart attack in his father and  mother; Heart disease in his father and mother; Hypertension in his father and mother; Stroke in his paternal aunt and paternal uncle.  Wt Readings from Last 3 Encounters:  03/28/16 65.8 kg (145 lb)  03/01/16 64.1 kg (141 lb 6.4 oz)  02/22/16 66.7 kg (147 lb)    PHYSICAL EXAM BP (!) 92/54   Pulse 70   Ht 5\' 5"  (1.651 m)   Wt 65.8 kg (145 lb)   BMI 24.13 kg/m  General appearance: alert, cooperative, appears stated age, no distress; and increasingly more frail. Chronically ill-appearing, pale, well groomed.Marland Kitchen answers questions appropriately.  Neck: no adenopathy, no carotid bruit, mild JVD VD; Supple, symmetrical, trachea midline  Lungs: CTAB, normal percussion bilaterally and Nonlabored, good air movement  Heart: normal apical impulse, RRR, S1, S2 normal, S4 present, harsh 2/6,c-d SEM @ 2nd right intercostal space, no click and no rub  Abdomen: soft, non-tender; bowel sounds normal; no masses, no organomegaly and Mild truncal obesity  Extremities: extremities normal, atraumatic, no cyanosis or edema and fistula in the left upper arm with good thrill - very aneurysmal Pulses: 2+ and symmetric - lower extremity. Reduced radial pulses secondary to left AV fistula and right chronically reduced Neurologic: Grossly normal; overwhelming spirits. Smiling.   Adult ECG Report n/a  Other studies Reviewed: Additional studies/ records that were reviewed today include:  Recent Labs:   Lab Results  Component Value Date   CHOL 107 08/10/2015   HDL 28.80 (L) 08/10/2015   LDLCALC 56 08/10/2015   LDLDIRECT 33.5 12/22/2012   TRIG 113.0 08/10/2015   CHOLHDL 4 08/10/2015   - needs recheck - can do in Aug  ASSESSMENT / PLAN: Problem List Items Addressed This Visit    Chronic combined systolic and diastolic congestive heart failure, NYHA class 2 (HCC) (Chronic)    No true heart failure symptoms of PND and orthopnea but when he has not had dialysis he does have some orthopnea with angina  decubitus. 2 mm limited by his hypotension. Only on 25 twice a day of carvedilol plus low-dose amlodipine for antianginal effect along with Imdur. With his hypotension I'm actually reducing the beta blocker dose.  - He has not had evaluation of his EF in quite a while. We may want to recheck if his aortic murmur becomes ladder.      Relevant Medications   carvedilol (COREG) 25 MG tablet   Chronic stable angina (HCC) (Chronic)   Relevant Medications   carvedilol (COREG) 25 MG tablet   Coronary artery disease involving native coronary artery of native heart with angina pectoris (HCC) - Primary (Chronic)    Thankfully, he has not had the intractable angina that he had prior to his last PTCA. For about a year he was requiring angioplasty every 3-6 months. We are now over a year and a half out from his last angioplasty and doing fairly well. He is on a pretty high dose of Imdur and also some low dose of amlodipine for antianginal effect. We are having to back off  on his carvedilol for blood pressure. The majority of his angina is actually angina decubitus related to increased volume on predialysis days. He is noted that his weight is up a bit now because of inability to get him back on his dry weight because of hypotension. With that he is noted a little bit more angina with lying down at night. Still only having to take 1-2 tablets of nitroglycerin per week now. He is on Plavix plus Pletal that were held for his colonoscopy and endoscopy recently. -- He may need to have been held for redo vascular surgery on his fistula. - For now we'll continue current dose of Imdur and amlodipine for antianginal benefit.      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 He did have some significant in-stent  restenosis, therefore I would be reluctant to stop his Plavix which he has been somewhat resistant to. We are only using 75 mg a day because he had some bleeding issues, therefore he is on Pletal as well.      History of Non-ST elevated myocardial infarction (non-STEMI) (Chronic)    Multiple non-ST elevation MIs in the past but has maintained relatively preserved EF with minor reduction.       Relevant Medications   carvedilol (COREG) 25 MG tablet   Hyperlipidemia with target LDL less than 70 (Chronic)    Last lipid panel showed fairly well controlled levels on his current dose of atorvastatin (10 mg day). With his weight loss, we have been reducing the dose. He should be due to have labs checked, but as well as a been controlled, we can really recheck levels in August when he sees his PCP.      Relevant Medications   carvedilol (COREG) 25 MG tablet   Hypotension of hemodialysis (Chronic)    Hypotensive today. At this point we really don't have much more room since amlodipine has helped his angina. We'll then need to just reduce the dose of beta blocker. Plan: Reduce Coreg  To 12.5 mg twice a day - if he remains hypotensive on dialysis days we can either have him hold the dose on the night before dialysis versus take a half a dose.      Relevant Medications   carvedilol (COREG) 25 MG tablet      Current medicines are reviewed at length with the patient today. (+/- concerns) None The following changes have been made:  Patient Instructions  Medication Instructions:  DECREASE Coreg to 12.5mg  Take half tab of 25mg  twice a day IF BLOOD PRESSURE IS STILL LOW ON DIALYSIS DAYS DO NOT TAKE NIGHT TIME DOSE  Labwork: None   Testing/Procedures: None   Follow-Up: Your physician recommends that you schedule a follow-up appointment in: 3-4 MONTHS WITH DR Yiannis Tulloch.  Any Other Special Instructions Will Be Listed Below (If Applicable).  If you need a refill on your cardiac medications  before your next appointment, please call your pharmacy.    Studies Ordered:   No orders of the defined types were placed in this encounter.     [name], M.D., M.S. Interventional Cardiologist   Pager # [phone] Phone # [phone] [address] [place], [place] [address]

## 2016-03-28 NOTE — Assessment & Plan Note (Signed)
Thankfully, he has not had the intractable angina that he had prior to his last PTCA. For about a year he was requiring angioplasty every 3-6 months. We are now over a year and a half out from his last angioplasty and doing fairly well. He is on a pretty high dose of Imdur and also some low dose of amlodipine for antianginal effect. We are having to back off on his carvedilol for blood pressure. The majority of his angina is actually angina decubitus related to increased volume on predialysis days. He is noted that his weight is up a bit now because of inability to get him back on his dry weight because of hypotension. With that he is noted a little bit more angina with lying down at night. Still only having to take 1-2 tablets of nitroglycerin per week now. He is on Plavix plus Pletal that were held for his colonoscopy and endoscopy recently. -- He may need to have been held for redo vascular surgery on his fistula. - For now we'll continue current dose of Imdur and amlodipine for antianginal benefit.

## 2016-03-29 DIAGNOSIS — E1129 Type 2 diabetes mellitus with other diabetic kidney complication: Secondary | ICD-10-CM | POA: Diagnosis not present

## 2016-03-29 DIAGNOSIS — D509 Iron deficiency anemia, unspecified: Secondary | ICD-10-CM | POA: Diagnosis not present

## 2016-03-29 DIAGNOSIS — E876 Hypokalemia: Secondary | ICD-10-CM | POA: Diagnosis not present

## 2016-03-29 DIAGNOSIS — N186 End stage renal disease: Secondary | ICD-10-CM | POA: Diagnosis not present

## 2016-03-29 DIAGNOSIS — N2581 Secondary hyperparathyroidism of renal origin: Secondary | ICD-10-CM | POA: Diagnosis not present

## 2016-03-29 DIAGNOSIS — D631 Anemia in chronic kidney disease: Secondary | ICD-10-CM | POA: Diagnosis not present

## 2016-03-31 DIAGNOSIS — N2581 Secondary hyperparathyroidism of renal origin: Secondary | ICD-10-CM | POA: Diagnosis not present

## 2016-03-31 DIAGNOSIS — D631 Anemia in chronic kidney disease: Secondary | ICD-10-CM | POA: Diagnosis not present

## 2016-03-31 DIAGNOSIS — N186 End stage renal disease: Secondary | ICD-10-CM | POA: Diagnosis not present

## 2016-03-31 DIAGNOSIS — E876 Hypokalemia: Secondary | ICD-10-CM | POA: Diagnosis not present

## 2016-03-31 DIAGNOSIS — E1129 Type 2 diabetes mellitus with other diabetic kidney complication: Secondary | ICD-10-CM | POA: Diagnosis not present

## 2016-03-31 DIAGNOSIS — D509 Iron deficiency anemia, unspecified: Secondary | ICD-10-CM | POA: Diagnosis not present

## 2016-04-03 DIAGNOSIS — E876 Hypokalemia: Secondary | ICD-10-CM | POA: Diagnosis not present

## 2016-04-03 DIAGNOSIS — N186 End stage renal disease: Secondary | ICD-10-CM | POA: Diagnosis not present

## 2016-04-03 DIAGNOSIS — D631 Anemia in chronic kidney disease: Secondary | ICD-10-CM | POA: Diagnosis not present

## 2016-04-03 DIAGNOSIS — N2581 Secondary hyperparathyroidism of renal origin: Secondary | ICD-10-CM | POA: Diagnosis not present

## 2016-04-03 DIAGNOSIS — D509 Iron deficiency anemia, unspecified: Secondary | ICD-10-CM | POA: Diagnosis not present

## 2016-04-03 DIAGNOSIS — E1129 Type 2 diabetes mellitus with other diabetic kidney complication: Secondary | ICD-10-CM | POA: Diagnosis not present

## 2016-04-04 ENCOUNTER — Encounter: Payer: Self-pay | Admitting: Vascular Surgery

## 2016-04-04 ENCOUNTER — Ambulatory Visit (INDEPENDENT_AMBULATORY_CARE_PROVIDER_SITE_OTHER): Payer: Medicare Other | Admitting: Vascular Surgery

## 2016-04-04 VITALS — BP 95/54 | HR 70 | Temp 96.5°F | Resp 18 | Ht 65.0 in | Wt 142.0 lb

## 2016-04-04 DIAGNOSIS — N186 End stage renal disease: Secondary | ICD-10-CM | POA: Diagnosis not present

## 2016-04-04 DIAGNOSIS — Z992 Dependence on renal dialysis: Secondary | ICD-10-CM

## 2016-04-04 NOTE — Progress Notes (Signed)
Patient ID: [name] [name], male   DOB: [birth year], 76 y.o.   MRN: [id number]  Reason for Consult: Re-evaluation (AVF steal symptoms)   Referred by Donato Heinz, MD  Subjective:     HPI:  [name] [name] is a 76 y.o. male is here for evaluation of left hand with possible steal syndrome. 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 Augustin Coupe for thromboses. Over the past several months he is developing worsened left hand pain that is exacerbated with dialysis. He states that at all times his hand feels different than his contralateral side but with dialysis it frankly hurts. His hand also feels cold. He has very minimal alleviating factors. Pain does not radiate is mostly constant and located in the left hand although occasionally left arm as well. He is not having constitutional symptoms and continues to dialyze as regularly scheduled.  Past Medical History:  Diagnosis Date  . Anemia    Likely secondary to history of GI bleed  . Anginal pain (HCC)    Chronic stable  . Anxiety   . Arthritis   . BPH (benign prostatic hyperplasia)   . CAD (coronary artery disease) of bypass graft 2006, 2012   In 2006: Occluded SVG-OM noted (SVG-diagonal was previously occluded); 2012: Severe lesion in redo SVG-OM1 --> BMS PCI   . CAD in native artery; and the grafts 1992, 1997, 2002, 2006, 2012   CABG 1992 and redo CABG 2006  . CAD S/P percutaneous coronary angioplasty October 2012; Feb, Apr & Oct 2015; Feb 2016   a) s/p CABG 1992 and redo 2006. b) 10/12: NSTEMI: PCI to SVG-OM1 Integrity BMS 3.5 x 15 (3.85 mm) c) 2/15: UA - PCI mid RCA Xience Ap DES 2.75 x 15 (3.1 mm). d) 4/15 : UA - focal dRCA Resolute DES 2.25 x 8 (2.5 mm). e) 10/15: PCI to mRCA ISR w/ post stent lesion - Promus P 2.75 x 16 (3.25 mm). f) 2/16: NSTEMI: CBA/PTCA of  mRCA ISR (3.5 mm post-dilation); g) 7/16 ISR RCA->CBA/PTCA, residual 20%.  . Carotid arterial disease  ([place])    a) Duplex 05/2012: R BULB/PROX ICA: 50-69%, L BULB/PROX ICA: 0-49%. ;; 04/29/2014: 47-82% RICA, 95-62% LICA. Patetn vertebrals,  . Chronic low back pain   . Colon cancer [facility]) 2003   colectomy for CA. no recurrence . never required  radiation or chem  . Diabetes mellitus    type -II  . Dyspnea    "when I get too much Fluid"  . End stage renal disease on dialysis [facility])    Dialyzes at [facility]: Tues/Th/Sat - left upper cavity AV fistula brachiocephalic  . Gout   . Heart murmur   . History of Non-ST elevated myocardial infarction (non-STEMI) 1992, 2006, 2012; 02/2013  . Hyperlipidemia   . Hypertension   . Hypothyroidism    On supplementation  . Ischemic cardiomyopathy    a) EF 45-50% by echo 2015. b) EF 50% by cath 04/2014.  Marland Kitchen LBBB (left bundle branch block)    Chronic  . Peptic ulcer disease  2004  . Plavix resistance    a) P2Y12 264 in 02/2014 - put on Brilinta but did not tolerate due to SOB. b) cannot be on Effient due to history of stroke. c) Plavix increased to 75mg  BID and Pletal added 04/2014 due to ISR.  Marland Kitchen Pneumonia   . Skin cancer 02/1999   nose  .  Stroke [facility]) 1998   Family History  Problem Relation Age of Onset  . Heart disease Mother   . Hypertension Mother   . Diabetes Mother   . Heart attack Mother   . Heart disease Father   . Hypertension Father   . Diabetes Father   . Heart attack Father   . Stroke Paternal Aunt   . Stroke Paternal Uncle   . Colon cancer Neg Hx   . Esophageal cancer Neg Hx    Past Surgical History:  Procedure Laterality Date  . APPENDECTOMY    . AV FISTULA REPAIR Left 05/2009  . CARDIAC CATHETERIZATION  10/16/2010   patent  LIMA to the LAD , PATENT  svg TO 2nd marginals ,occluded PLA with collaterals and SVG to the OM  had 90% stenosis  was txlge  bare - metal  3.5  Integrity ten  postdilate  36-37  mm    . CARDIAC CATHETERIZATION  03/22/2004   loss of both SVG,severe disease in prox and ostial  circ not amenable to  invention. mod diease distal LAD  AFTER BYPASS GRAFT;-CVTS to evaluate   . CARDIAC CATHETERIZATION N/A 08/03/2014   Procedure: Left Heart Cath and Cors/Grafts Angiography;  Surgeon: Leonie Man, MD;  Location: [place] [facility];  Service: Cardiovascular;  Laterality: N/A;  . CARDIAC CATHETERIZATION N/A 08/03/2014   Procedure: Coronary Balloon Angioplasty;  Surgeon: Leonie Man, MD;  Location: [place] [facility];  Service: Cardiovascular;: Aggressive [place] Balloon - [place] Balloon PTCA of ISR site in mRCA (3 stent layer)  . CAROTID DOPPLER  05/23/2012   ABN CAROTID-- RGT BULB/PROX ICA mild to mod 50-60%;lft bulb/prox mild to mod 0-49%;left subclavian abn waveforms consistent with patients lft arm A/V fistula  . CATARACT EXTRACTION W/ INTRAOCULAR LENS  IMPLANT, BILATERAL Bilateral   . CHOLECYSTECTOMY  2009  . COLON RESECTION  2003   transverse and proximal descending w/ primar anastomosis  . COLON SURGERY  2003   "cancer"  . COLONOSCOPY    . CORONARY ANGIOPLASTY  04/03/1995   OM and Circ  . CORONARY ANGIOPLASTY  02/01/2000   CIRC  . CORONARY ANGIOPLASTY  02/09/14   Cutting Balloon PTCA of mRCA ISR 99% - 3.5 mm  . CORONARY ARTERY BYPASS GRAFT  08/22/1990   INITIAL:LIMA to LAD, SVG to  Diagonal, SVG to OM  . CORONARY ARTERY BYPASS GRAFT  2006   SVG to OM1,SVG to OM2 with patent LIMA  to LAD and occluded vein  graft to the diagonal  and occluded vein grft to OM from  prior  surgery  . DOPPLER ECHOCARDIOGRAPHY  01/25/2012   EF 50 to 55%  . EVENT MONITOR  01/23/2012-02/06/2012   SINUS ,LBBB,unifocal PVCs  . LEFT AND RIGHT HEART CATHETERIZATION WITH CORONARY/GRAFT ANGIOGRAM N/A 04/20/2014   Procedure: LEFT AND RIGHT HEART CATHETERIZATION WITH Beatrix Fetters;  Surgeon: Sherren Mocha, MD;  Location: [facility] CATH LAB;  Service: Cardiovascular;  Laterality: N/A;  . LEFT HEART CATHETERIZATION WITH CORONARY ANGIOGRAM N/A 02/23/2013   Procedure: LEFT HEART CATHETERIZATION WITH CORONARY  ANGIOGRAM;  Surgeon: Lorretta Harp, MD;  Location: [facility] CATH LAB;  Service: Cardiovascular;  Laterality: N/A;  . LEFT HEART CATHETERIZATION WITH CORONARY/GRAFT ANGIOGRAM N/A 04/30/2013   Procedure: LEFT HEART CATHETERIZATION WITH Beatrix Fetters;  Surgeon: Leonie Man, MD;  Location: [facility] CATH LAB;  Service: Cardiovascular;  Laterality: N/A;  . LEFT HEART CATHETERIZATION WITH CORONARY/GRAFT ANGIOGRAM N/A 10/15/2013   Procedure: LEFT HEART CATHETERIZATION WITH CORONARY/GRAFT ANGIOGRAM;  Surgeon: Leonie Man, MD;  Location: [facility] CATH LAB;  Service: Cardiovascular;  Laterality: N/A;  . LEFT HEART CATHETERIZATION WITH CORONARY/GRAFT ANGIOGRAM N/A 02/09/2014   Procedure: LEFT HEART CATHETERIZATION WITH Beatrix Fetters;  Surgeon: Leonie Man, MD;  Location: [facility] CATH LAB;  Service: Cardiovascular;  Laterality: N/A;  . Lower Extremity Arterial Dopplers  October 2014   Calcified but non-occlusive peripheral arteries. No evidence of stenosis  . NM MYOCAR PERF WALL MOTION  10/12/2011   EF 54%,LV normal ; no signifiant ischemia  . PERCUTANEOUS CORONARY STENT INTERVENTION (PCI-S)  02/23/2013   mid RCA 80% & 70% - Xience 2.75 mm x 15 mm ( 3.73mm) ; LIMA-LAD patent, SVG-OM patent (stent patent); SVG-Diag patent.  Marland Kitchen PERCUTANEOUS CORONARY STENT INTERVENTION (PCI-S)  April 2015   dRCA - Integrity Resolute DES   2.25 mm x 53mm (2.5 mm)  . PERCUTANEOUS CORONARY STENT INTERVENTION (PCI-S)  Oct 15 2013   [place] Angina: mRCA ISR with post-stent stenosis -- [place] PTCA & PCI Promus Premier DES 2.75 mm x 16 mm (3.25 mm)  . POSTERIOR LUMBAR FUSION    . REVISON OF ARTERIOVENOUS FISTULA Left 32/67/1245   Procedure: PLICATION OF LEFT UPPER ARM  ARTERIOVENOUS FISTULA  ANEURYSM;  Surgeon: Rosetta Posner, MD;  Location: [place];  Service: Vascular;  Laterality: Left;  . TRANSTHORACIC ECHOCARDIOGRAM  02/23/2013   Moderate concentric hypertrophy. EF 4500%. Septal bounce. Grade 2 diastolic dysfunction  (pseudo-normal - severely elevated filling pressures) mild to moderate MR and moderate LA dilation to    Short Social History:  Social History  Substance Use Topics  . Smoking status: Never Smoker  . Smokeless tobacco: Never Used  . Alcohol use No    Allergies  Allergen Reactions  . Brilinta [Ticagrelor] Shortness Of Breath  . Shellfish Allergy Other (See Comments)    Causes gout flare-ups    Current Outpatient Prescriptions  Medication Sig Dispense Refill  . acetaminophen (TYLENOL) 500 MG tablet Take 1,000 mg by mouth every 6 (six) hours as needed for mild pain.    Marland Kitchen amLODipine (NORVASC) 5 MG tablet Take 0.5 tablet on non dialysis day  By mouth. 90 tablet 3  . Besifloxacin HCl (BESIVANCE) 0.6 % SUSP Place 1 drop into both eyes See admin instructions. Use eye drops 4 times daily for 2 days following injection by Dr. Zigmund Daniel (every 10 weeks) Next injection due 02-2016    . carvedilol (COREG) 25 MG tablet Take 0.5 tablets (12.5 mg total) by mouth 2 (two) times daily with a meal. 180 tablet 1  . cilostazol (PLETAL) 100 MG tablet TAKE ONE TABLET BY MOUTH TWICE DAILY 180 tablet 1  . clonazePAM (KLONOPIN) 0.5 MG tablet Take 1 tablet (0.5 mg total) by mouth 2 (two) times daily as needed for anxiety. 30 tablet 1  . clopidogrel (PLAVIX) 75 MG tablet Take 1 tablet (75 mg total) by mouth daily. 90 tablet 3  . glucose blood (TRUE METRIX BLOOD GLUCOSE TEST) test strip 1 each by Other route as needed for other. Use as instructed to test sugars 2-3 times daily. Dx. E11.40 100 each 12  . isosorbide mononitrate (IMDUR) 120 MG 24 hr tablet Take 1 tablet (120 mg total) by mouth daily. 90 tablet 3  . levothyroxine (SYNTHROID, LEVOTHROID) 125 MCG tablet TAKE ONE TABLET BY MOUTH ONCE DAILY BEFORE BREAKFAST (Patient taking differently: Take 125 mcg by mouth daily before breakfast. ) 90 tablet 1  . lidocaine-prilocaine (EMLA) cream Apply 1 application topically See admin  instructions. Apply prior to dialysis  treatments on Tuesday, Thursday and Saturday    . loperamide (IMODIUM A-D) 2 MG tablet Take 4 mg by mouth 2 (two) times daily as needed for diarrhea or loose stools.    Marland Kitchen NITROSTAT 0.4 MG SL tablet PLACE 1 TABLET UNDER THE TONGUE EVERY 5 MINUTES AS NEEDED FOR CHEST PAIN (MAX 3 DOSES PER DAY) 25 tablet 3  . oxyCODONE-acetaminophen (PERCOCET/ROXICET) 5-325 MG tablet Take 1 tablet by mouth as needed.    . pantoprazole (PROTONIX) 40 MG tablet Take 1 tablet (40 mg total) by mouth daily. 90 tablet 3  . sevelamer carbonate (RENVELA) 800 MG tablet Take 2,400-3,200 mg by mouth See admin instructions. Take 4 tablets (3200 mg) with meals and 3 tablets (2400 mg) with snacks    . sucralfate (CARAFATE) 1 g tablet Take 1 tablet (1 g total) by mouth 4 (four) times daily -  with meals and at bedtime. 90 tablet 1  . atorvastatin (LIPITOR) 20 MG tablet Take 0.5 tablets (10 mg total) by mouth daily. 90 tablet 3   Current Facility-Administered Medications  Medication Dose Route Frequency Provider Last Rate Last Dose  . 0.9 %  sodium chloride infusion  500 mL Intravenous Continuous Gatha Mayer, MD        Review of Systems  Constitutional:  Constitutional negative. HENT: HENT negative.  Eyes: Eyes negative.  Respiratory: Respiratory negative.  Cardiovascular: Cardiovascular negative.  GI: Gastrointestinal negative.  Musculoskeletal:       Hand pain as above Neurological: Neurological negative. Hematologic: Hematologic/lymphatic negative.  Psychiatric: Psychiatric negative.        Objective:  Objective   Vitals:   04/04/16 1249  BP: (!) 95/54  Pulse: 70  Resp: 18  Temp: (!) 96.5 F (35.8 C)  SpO2: 95%  Weight: 142 lb (64.4 kg)  Height: 5\' 5"  (1.651 m)   Body mass index is 23.63 kg/m.  Physical Exam  Constitutional: He is oriented to person, place, and time. He appears well-developed.  HENT:  Head: Normocephalic.  Eyes: Pupils are equal, round, and reactive to light.  Neck: Normal range  of motion.  Cardiovascular:  No palpable left radial pulse, signal at radial is monophasic and absent at palmar arch. Both signals augment with compression of the graft  Abdominal: Soft. He exhibits no mass.  Musculoskeletal: Normal range of motion. He exhibits no edema.  Neurological: He is alert and oriented to person, place, and time.  Skin: Skin is warm and dry.  Psychiatric: He has a normal mood and affect. His behavior is normal. Judgment and thought content normal.    Data: I reviewed his steal study which demonstrated a noncompressible radial artery consistent with either steal or intrinsic vascular disease.     Assessment/Plan:     76 year old male with end-stage renal disease on dialysis via left upper extremity AV fistula and likely steal causing significant hand pain. The ultrasound study was equivocal but on exam his radial artery and palmar arch signals significantly augment with compression of his fistula. Given this I have offered him to do nothing versus to ligate the fistula and place a catheter with consideration of fistula and right arm versus proceeding with left upper extremity angiogram for possible arterial intervention versus planning of further procedures which would be either banding or distal revascularization with interval ligation which I would think would be preferable in him. He states that both saphenous veins have been harvested in the past but on physical  exam he does have a large forearm cephalic vein that looks like would be suitable for DRIL. At this time the he wishes to not have any intervention and to continue the current course with dialysis as scheduled to his left upper extremity. I told him that the natural course would be continued or worsening pain and possible ischemic ulceration of his fingers. If he changes his mind or has worsening he will call the office at which time we can set him up for aortogram with left upper extremity angiogram for possible  treatment of intrinsic arterial disease and/or planning of more invasive intervention. He demonstrates good understanding in the presence of his wife.     Waynetta Sandy MD [facility]

## 2016-04-05 DIAGNOSIS — D509 Iron deficiency anemia, unspecified: Secondary | ICD-10-CM | POA: Diagnosis not present

## 2016-04-05 DIAGNOSIS — N186 End stage renal disease: Secondary | ICD-10-CM | POA: Diagnosis not present

## 2016-04-05 DIAGNOSIS — E1129 Type 2 diabetes mellitus with other diabetic kidney complication: Secondary | ICD-10-CM | POA: Diagnosis not present

## 2016-04-05 DIAGNOSIS — E876 Hypokalemia: Secondary | ICD-10-CM | POA: Diagnosis not present

## 2016-04-05 DIAGNOSIS — D631 Anemia in chronic kidney disease: Secondary | ICD-10-CM | POA: Diagnosis not present

## 2016-04-05 DIAGNOSIS — N2581 Secondary hyperparathyroidism of renal origin: Secondary | ICD-10-CM | POA: Diagnosis not present

## 2016-04-07 DIAGNOSIS — Z992 Dependence on renal dialysis: Secondary | ICD-10-CM | POA: Diagnosis not present

## 2016-04-07 DIAGNOSIS — E876 Hypokalemia: Secondary | ICD-10-CM | POA: Diagnosis not present

## 2016-04-07 DIAGNOSIS — N2581 Secondary hyperparathyroidism of renal origin: Secondary | ICD-10-CM | POA: Diagnosis not present

## 2016-04-07 DIAGNOSIS — D631 Anemia in chronic kidney disease: Secondary | ICD-10-CM | POA: Diagnosis not present

## 2016-04-07 DIAGNOSIS — N186 End stage renal disease: Secondary | ICD-10-CM | POA: Diagnosis not present

## 2016-04-07 DIAGNOSIS — D509 Iron deficiency anemia, unspecified: Secondary | ICD-10-CM | POA: Diagnosis not present

## 2016-04-07 DIAGNOSIS — E1129 Type 2 diabetes mellitus with other diabetic kidney complication: Secondary | ICD-10-CM | POA: Diagnosis not present

## 2016-04-10 ENCOUNTER — Other Ambulatory Visit: Payer: Self-pay | Admitting: Family Medicine

## 2016-04-10 DIAGNOSIS — N2581 Secondary hyperparathyroidism of renal origin: Secondary | ICD-10-CM | POA: Diagnosis not present

## 2016-04-10 DIAGNOSIS — E1129 Type 2 diabetes mellitus with other diabetic kidney complication: Secondary | ICD-10-CM | POA: Diagnosis not present

## 2016-04-10 DIAGNOSIS — E876 Hypokalemia: Secondary | ICD-10-CM | POA: Diagnosis not present

## 2016-04-10 DIAGNOSIS — N186 End stage renal disease: Secondary | ICD-10-CM | POA: Diagnosis not present

## 2016-04-10 DIAGNOSIS — D631 Anemia in chronic kidney disease: Secondary | ICD-10-CM | POA: Diagnosis not present

## 2016-04-11 NOTE — Telephone Encounter (Signed)
Last OV 02/02/16 Clonazepam last filled 02/02/16 #30 with 1

## 2016-04-11 NOTE — Telephone Encounter (Signed)
Will allow 1 fill in PCP absence.  Rx printed and signed.

## 2016-04-12 ENCOUNTER — Other Ambulatory Visit: Payer: Self-pay | Admitting: Physician Assistant

## 2016-04-12 DIAGNOSIS — N186 End stage renal disease: Secondary | ICD-10-CM | POA: Diagnosis not present

## 2016-04-12 DIAGNOSIS — D631 Anemia in chronic kidney disease: Secondary | ICD-10-CM | POA: Diagnosis not present

## 2016-04-12 DIAGNOSIS — E876 Hypokalemia: Secondary | ICD-10-CM | POA: Diagnosis not present

## 2016-04-12 DIAGNOSIS — N2581 Secondary hyperparathyroidism of renal origin: Secondary | ICD-10-CM | POA: Diagnosis not present

## 2016-04-12 DIAGNOSIS — E1129 Type 2 diabetes mellitus with other diabetic kidney complication: Secondary | ICD-10-CM | POA: Diagnosis not present

## 2016-04-13 NOTE — Telephone Encounter (Signed)
OK to do it  I would suggest 500 mg tid x 10 d instead of 250 qid but if he wants the latter ok

## 2016-04-13 NOTE — Telephone Encounter (Signed)
Patient said let's do the generic flagyl 500mg  tid for 10 days.  He will call back if not better after taking the medicine.

## 2016-04-13 NOTE — Telephone Encounter (Signed)
Spoke with [name] [name] and he is having diarrhea 5-6 times a day, no fever, no blood in the stool, no abdominal pain.  This is why he is requesting a refill on the flagyl.  Please advise.

## 2016-04-14 DIAGNOSIS — E876 Hypokalemia: Secondary | ICD-10-CM | POA: Diagnosis not present

## 2016-04-14 DIAGNOSIS — D631 Anemia in chronic kidney disease: Secondary | ICD-10-CM | POA: Diagnosis not present

## 2016-04-14 DIAGNOSIS — N186 End stage renal disease: Secondary | ICD-10-CM | POA: Diagnosis not present

## 2016-04-14 DIAGNOSIS — N2581 Secondary hyperparathyroidism of renal origin: Secondary | ICD-10-CM | POA: Diagnosis not present

## 2016-04-14 DIAGNOSIS — E1129 Type 2 diabetes mellitus with other diabetic kidney complication: Secondary | ICD-10-CM | POA: Diagnosis not present

## 2016-04-17 DIAGNOSIS — N186 End stage renal disease: Secondary | ICD-10-CM | POA: Diagnosis not present

## 2016-04-17 DIAGNOSIS — E1129 Type 2 diabetes mellitus with other diabetic kidney complication: Secondary | ICD-10-CM | POA: Diagnosis not present

## 2016-04-17 DIAGNOSIS — D631 Anemia in chronic kidney disease: Secondary | ICD-10-CM | POA: Diagnosis not present

## 2016-04-17 DIAGNOSIS — E876 Hypokalemia: Secondary | ICD-10-CM | POA: Diagnosis not present

## 2016-04-17 DIAGNOSIS — N2581 Secondary hyperparathyroidism of renal origin: Secondary | ICD-10-CM | POA: Diagnosis not present

## 2016-04-19 DIAGNOSIS — E876 Hypokalemia: Secondary | ICD-10-CM | POA: Diagnosis not present

## 2016-04-19 DIAGNOSIS — D631 Anemia in chronic kidney disease: Secondary | ICD-10-CM | POA: Diagnosis not present

## 2016-04-19 DIAGNOSIS — N186 End stage renal disease: Secondary | ICD-10-CM | POA: Diagnosis not present

## 2016-04-19 DIAGNOSIS — E1129 Type 2 diabetes mellitus with other diabetic kidney complication: Secondary | ICD-10-CM | POA: Diagnosis not present

## 2016-04-19 DIAGNOSIS — N2581 Secondary hyperparathyroidism of renal origin: Secondary | ICD-10-CM | POA: Diagnosis not present

## 2016-04-21 DIAGNOSIS — E876 Hypokalemia: Secondary | ICD-10-CM | POA: Diagnosis not present

## 2016-04-21 DIAGNOSIS — E1129 Type 2 diabetes mellitus with other diabetic kidney complication: Secondary | ICD-10-CM | POA: Diagnosis not present

## 2016-04-21 DIAGNOSIS — N186 End stage renal disease: Secondary | ICD-10-CM | POA: Diagnosis not present

## 2016-04-21 DIAGNOSIS — N2581 Secondary hyperparathyroidism of renal origin: Secondary | ICD-10-CM | POA: Diagnosis not present

## 2016-04-21 DIAGNOSIS — D631 Anemia in chronic kidney disease: Secondary | ICD-10-CM | POA: Diagnosis not present

## 2016-04-23 ENCOUNTER — Telehealth: Payer: Self-pay | Admitting: Cardiology

## 2016-04-23 NOTE — Telephone Encounter (Signed)
Spoke to patient Patient states blood pressure is staying at 90/50 or less all the time even on dialysis days it is low.   dialysis days tues,thurs., sat.patient states he is constantly fatigue.patient states he is not taking carvedilol the evening dose prior to dialysis days.    Also is having chest pain  More frequent.  Patient states  Dr Donzetta Matters  Will redo his fistula and will need clearance.  RN offered appointment for today. Patient requested to see Dr Ellyn Hack only- appointment schedule for 04/25/16 at 1:40 PM.  RN instructed patient not to take carvedilol today will defer to Dr Ellyn Hack for further instructions.

## 2016-04-23 NOTE — Telephone Encounter (Signed)
New message    Pt c/o BP issue: STAT if pt c/o blurred vision, one-sided weakness or slurred speech  1. What are your last 5 BP readings? 90/50  2. Are you having any other symptoms (ex. Dizziness, headache, blurred vision, passed out)? Dizziness, headache, blurred vision  3. What is your BP issue? Pt states his bp is low

## 2016-04-24 DIAGNOSIS — D631 Anemia in chronic kidney disease: Secondary | ICD-10-CM | POA: Diagnosis not present

## 2016-04-24 DIAGNOSIS — E1129 Type 2 diabetes mellitus with other diabetic kidney complication: Secondary | ICD-10-CM | POA: Diagnosis not present

## 2016-04-24 DIAGNOSIS — N2581 Secondary hyperparathyroidism of renal origin: Secondary | ICD-10-CM | POA: Diagnosis not present

## 2016-04-24 DIAGNOSIS — N186 End stage renal disease: Secondary | ICD-10-CM | POA: Diagnosis not present

## 2016-04-24 DIAGNOSIS — E876 Hypokalemia: Secondary | ICD-10-CM | POA: Diagnosis not present

## 2016-04-25 ENCOUNTER — Encounter: Payer: Self-pay | Admitting: Cardiology

## 2016-04-25 ENCOUNTER — Ambulatory Visit (INDEPENDENT_AMBULATORY_CARE_PROVIDER_SITE_OTHER): Payer: Medicare Other | Admitting: Cardiology

## 2016-04-25 VITALS — BP 98/60 | HR 71 | Ht 65.0 in | Wt 145.0 lb

## 2016-04-25 DIAGNOSIS — I25708 Atherosclerosis of coronary artery bypass graft(s), unspecified, with other forms of angina pectoris: Secondary | ICD-10-CM | POA: Diagnosis not present

## 2016-04-25 DIAGNOSIS — I5042 Chronic combined systolic (congestive) and diastolic (congestive) heart failure: Secondary | ICD-10-CM | POA: Diagnosis not present

## 2016-04-25 DIAGNOSIS — I25119 Atherosclerotic heart disease of native coronary artery with unspecified angina pectoris: Secondary | ICD-10-CM

## 2016-04-25 DIAGNOSIS — T82855D Stenosis of coronary artery stent, subsequent encounter: Secondary | ICD-10-CM

## 2016-04-25 DIAGNOSIS — E785 Hyperlipidemia, unspecified: Secondary | ICD-10-CM

## 2016-04-25 DIAGNOSIS — I953 Hypotension of hemodialysis: Secondary | ICD-10-CM | POA: Diagnosis not present

## 2016-04-25 DIAGNOSIS — I208 Other forms of angina pectoris: Secondary | ICD-10-CM

## 2016-04-25 MED ORDER — METOPROLOL SUCCINATE ER 50 MG PO TB24: 50.0000 mg | ORAL_TABLET | Freq: Every day | ORAL | 3 refills | Status: DC

## 2016-04-25 NOTE — Patient Instructions (Signed)
Medication change  -Stop carvedilol - start  toprol xl ( metoprolol succinate) 25 mg  at bedtime   If you are dizzy Or blood pressure is low - hold  Amlodipine 2.5 mg  The night before dialysis  Take imdur at dinner  Time each day.   Your physician recommends that you schedule a follow-up appointment in keep appointment for June 2018

## 2016-04-25 NOTE — Progress Notes (Signed)
PCP: Annye Asa, MD  Clinic Note: Chief Complaint  Patient presents with  . Follow-up    Hypotension  . Headache  . Chest Pain    CAD  . Shortness of Breath    HPI: [name] [name] is a 75 y.o. male with a PMH below who presents today for early follow-up due to complaints of persistent hypotension and angina. 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 He's had several redo interventions on the RCA was recently in July 2016. He is on hemodialysis is not a candidate for redo CABG.  [name] [name] was last seen on 03/28/2016. He is actually doing fairly well at that time. Maybe he noted was that he was hypotensive around dialysis. We have backed off on his amlodipine dose, and finally he stopped that altogether. Unfortunately, amlodipine had helped his angina.  Recent Hospitalizations: n/a  Studies Reviewed: n/a  Interval History: [name] [name] presents today still feeling poorly. He is still notes his blood pressures too low. He just simply cannot function. He pretty much has no energy to do anything. On postdialysis days, he basically goes to bed. Part of the problem is that with his blood pressure being low, they're not able to pull off enough fluid and he therefore started have more of his angina decubitus chest pain. He started taking more nitroglycerin, up to 3 tablets this week. He says in dialysis his blood pressure continued and the 70s and quite often is starting in the 27C systolic. He is not having any palpitations or rapid irregular heartbeats. Certainly not having any heart failure symptoms of PND, orthopnea or edema, however he does have angina decubitus/orthopnea type symptoms on his predialysis days. He is not having anginal type symptoms during the day when he is up and about, however at night he is having more prominent angina decubitus symptoms. When his blood pressures down low enough, he has had some episodes  where he is almost borderline syncopal. He is never lost consciousness, but does get quite "out of it." He denies any TIA or amaurosis fugax symptoms, and no true syncope. No claudication. Unfortunately he does walk around to note any claudication.   ROS: A comprehensive was performed. Review of Systems  Constitutional: Positive for malaise/fatigue and weight loss.  HENT: Negative for congestion and nosebleeds.   Respiratory: Positive for shortness of breath and wheezing. Negative for cough.   Cardiovascular: Positive for chest pain (Worse when lying down at night consistent with angina decubitus.).  Gastrointestinal: Negative for blood in stool, diarrhea (Chronic loose stool since his bowel surgery) and melena.  Musculoskeletal: Positive for back pain and joint pain (Hips and knees).  Neurological: Positive for dizziness (With hypotension) and weakness. Negative for focal weakness and loss of consciousness.  Psychiatric/Behavioral: Positive for depression and memory loss. The patient has insomnia (Occasionally uses melatonin).   All other systems reviewed and are negative.   Past Medical History:  Diagnosis Date  . Anemia    Likely secondary to history of GI bleed  . Anginal pain (HCC)    Chronic stable  . Anxiety   . Arthritis   . BPH (benign prostatic hyperplasia)   . CAD (coronary artery disease) of bypass graft 2006, 2012   In 2006: Occluded SVG-OM noted (SVG-diagonal was previously occluded); 2012: Severe lesion in redo SVG-OM1 --> BMS PCI   . CAD in native artery; and the grafts 1992, 1997, 2002, 2006, 2012  CABG 1992 and redo CABG 2006  . CAD S/P percutaneous coronary angioplasty October 2012; Feb, Apr & Oct 2015; Feb 2016   a) s/p CABG 1992 and redo 2006. b) 10/12: NSTEMI: PCI to SVG-OM1 Integrity BMS 3.5 x 15 (3.85 mm) c) 2/15: UA - PCI mid RCA Xience Ap DES 2.75 x 15 (3.1 mm). d) 4/15 : UA - focal dRCA Resolute DES 2.25 x 8 (2.5 mm). e) 10/15: PCI to mRCA ISR w/ post stent  lesion - Promus P 2.75 x 16 (3.25 mm). f) 2/16: NSTEMI: CBA/PTCA of  mRCA ISR (3.5 mm post-dilation); g) 7/16 ISR RCA->CBA/PTCA, residual 20%.  . Carotid arterial disease ([place])    a) Duplex 05/2012: R BULB/PROX ICA: 50-69%, L BULB/PROX ICA: 0-49%. ;; 04/29/2014: 45-40% RICA, 98-11% LICA. Patetn vertebrals,  . Chronic low back pain   . Colon cancer [facility]) 2003   colectomy for CA. no recurrence . never required  radiation or chem  . Diabetes mellitus    type -II  . Dyspnea    "when I get too much Fluid"  . End stage renal disease on dialysis [facility])    Dialyzes at [facility]: Tues/Th/Sat - left upper cavity AV fistula brachiocephalic  . Gout   . Heart murmur   . History of Non-ST elevated myocardial infarction (non-STEMI) 1992, 2006, 2012; 02/2013  . Hyperlipidemia   . Hypertension   . Hypothyroidism    On supplementation  . Ischemic cardiomyopathy    a) EF 45-50% by echo 2015. b) EF 50% by cath 04/2014.  Marland Kitchen LBBB (left bundle branch block)    Chronic  . Peptic ulcer disease  2004  . Plavix resistance    a) P2Y12 264 in 02/2014 - put on Brilinta but did not tolerate due to SOB. b) cannot be on Effient due to history of stroke. c) Plavix increased to 75mg  BID and Pletal added 04/2014 due to ISR.  Marland Kitchen Pneumonia   . Skin cancer 02/1999   nose  . Stroke [facility]) 1998    Past Surgical History:  Procedure Laterality Date  . APPENDECTOMY    . AV FISTULA REPAIR Left 05/2009  . CARDIAC CATHETERIZATION  10/16/2010   patent  LIMA to the LAD , PATENT  svg TO 2nd marginals ,occluded PLA with collaterals and SVG to the OM  had 90% stenosis  was txlge  bare - metal  3.5  Integrity ten  postdilate  36-37  mm    . CARDIAC CATHETERIZATION  03/22/2004   loss of both SVG,severe disease in prox and ostial  circ not amenable to invention. mod diease distal LAD  AFTER BYPASS GRAFT;-CVTS to evaluate   . CARDIAC CATHETERIZATION N/A 08/03/2014   Procedure: Left Heart Cath and Cors/Grafts Angiography;  Surgeon:  Leonie Man, MD;  Location: [place] CV LAB;  Service: Cardiovascular;  Laterality: N/A;  . CARDIAC CATHETERIZATION N/A 08/03/2014   Procedure: Coronary Balloon Angioplasty;  Surgeon: Leonie Man, MD;  Location: [place] CV LAB;  Service: Cardiovascular;: Aggressive Cutting Balloon - [place] Balloon PTCA of ISR site in mRCA (3 stent layer)  . CAROTID DOPPLER  05/23/2012   ABN CAROTID-- RGT BULB/PROX ICA mild to mod 50-60%;lft bulb/prox mild to mod 0-49%;left subclavian abn waveforms consistent with patients lft arm A/V fistula  . CATARACT EXTRACTION W/ INTRAOCULAR LENS  IMPLANT, BILATERAL Bilateral   . CHOLECYSTECTOMY  2009  . COLON RESECTION  2003   transverse and proximal descending w/ primar anastomosis  . COLON  SURGERY  2003   "cancer"  . COLONOSCOPY    . CORONARY ANGIOPLASTY  04/03/1995   OM and Circ  . CORONARY ANGIOPLASTY  02/01/2000   CIRC  . CORONARY ANGIOPLASTY  02/09/14   Cutting Balloon PTCA of mRCA ISR 99% - 3.5 mm  . CORONARY ARTERY BYPASS GRAFT  08/22/1990   INITIAL:LIMA to LAD, SVG to  Diagonal, SVG to OM  . CORONARY ARTERY BYPASS GRAFT  2006   SVG to OM1,SVG to OM2 with patent LIMA  to LAD and occluded vein  graft to the diagonal  and occluded vein grft to OM from  prior  surgery  . DOPPLER ECHOCARDIOGRAPHY  01/25/2012   EF 50 to 55%  . EVENT MONITOR  01/23/2012-02/06/2012   SINUS ,LBBB,unifocal PVCs  . LEFT AND RIGHT HEART CATHETERIZATION WITH CORONARY/GRAFT ANGIOGRAM N/A 04/20/2014   Procedure: LEFT AND RIGHT HEART CATHETERIZATION WITH Beatrix Fetters;  Surgeon: Sherren Mocha, MD;  Location: [facility] CATH LAB;  Service: Cardiovascular;  Laterality: N/A;  . LEFT HEART CATHETERIZATION WITH CORONARY ANGIOGRAM N/A 02/23/2013   Procedure: LEFT HEART CATHETERIZATION WITH CORONARY ANGIOGRAM;  Surgeon: Lorretta Harp, MD;  Location: [facility] CATH LAB;  Service: Cardiovascular;  Laterality: N/A;  . LEFT HEART CATHETERIZATION WITH CORONARY/GRAFT ANGIOGRAM N/A 04/30/2013    Procedure: LEFT HEART CATHETERIZATION WITH Beatrix Fetters;  Surgeon: Leonie Man, MD;  Location: [facility] CATH LAB;  Service: Cardiovascular;  Laterality: N/A;  . LEFT HEART CATHETERIZATION WITH CORONARY/GRAFT ANGIOGRAM N/A 10/15/2013   Procedure: LEFT HEART CATHETERIZATION WITH Beatrix Fetters;  Surgeon: Leonie Man, MD;  Location: [facility] CATH LAB;  Service: Cardiovascular;  Laterality: N/A;  . LEFT HEART CATHETERIZATION WITH CORONARY/GRAFT ANGIOGRAM N/A 02/09/2014   Procedure: LEFT HEART CATHETERIZATION WITH Beatrix Fetters;  Surgeon: Leonie Man, MD;  Location: [facility] CATH LAB;  Service: Cardiovascular;  Laterality: N/A;  . Lower Extremity Arterial Dopplers  October 2014   Calcified but non-occlusive peripheral arteries. No evidence of stenosis  . NM MYOCAR PERF WALL MOTION  10/12/2011   EF 54%,LV normal ; no signifiant ischemia  . PERCUTANEOUS CORONARY STENT INTERVENTION (PCI-S)  02/23/2013   mid RCA 80% & 70% - Xience 2.75 mm x 15 mm ( 3.72mm) ; LIMA-LAD patent, SVG-OM patent (stent patent); SVG-Diag patent.  Marland Kitchen PERCUTANEOUS CORONARY STENT INTERVENTION (PCI-S)  April 2015   dRCA - Integrity Resolute DES   2.25 mm x 58mm (2.5 mm)  . PERCUTANEOUS CORONARY STENT INTERVENTION (PCI-S)  Oct 15 2013   [place] Angina: mRCA ISR with post-stent stenosis -- [place] PTCA & PCI Promus Premier DES 2.75 mm x 16 mm (3.25 mm)  . POSTERIOR LUMBAR FUSION    . REVISON OF ARTERIOVENOUS FISTULA Left 93/79/0240   Procedure: PLICATION OF LEFT UPPER ARM  ARTERIOVENOUS FISTULA  ANEURYSM;  Surgeon: Rosetta Posner, MD;  Location: [place];  Service: Vascular;  Laterality: Left;  . TRANSTHORACIC ECHOCARDIOGRAM  02/23/2013   Moderate concentric hypertrophy. EF 4500%. Septal bounce. Grade 2 diastolic dysfunction (pseudo-normal - severely elevated filling pressures) mild to moderate MR and moderate LA dilation to    Current Meds  Medication Sig  . acetaminophen (TYLENOL) 500 MG tablet Take 1,000 mg by  mouth every 6 (six) hours as needed for mild pain.  Marland Kitchen amLODipine (NORVASC) 5 MG tablet Take 0.5 tablet on non dialysis day  By mouth.  . Besifloxacin HCl (BESIVANCE) 0.6 % SUSP Place 1 drop into both eyes See admin instructions. Use eye drops 4 times daily for 2 days  following injection by Dr. Zigmund Daniel (every 10 weeks) Next injection due 02-2016  . cilostazol (PLETAL) 100 MG tablet TAKE ONE TABLET BY MOUTH TWICE DAILY  . clonazePAM (KLONOPIN) 0.5 MG tablet TAKE ONE TABLET BY MOUTH TWICE DAILY AS NEEDED FOR ANXIETY  . clopidogrel (PLAVIX) 75 MG tablet Take 1 tablet (75 mg total) by mouth daily.  Marland Kitchen glucose blood (TRUE METRIX BLOOD GLUCOSE TEST) test strip 1 each by Other route as needed for other. Use as instructed to test sugars 2-3 times daily. Dx. E11.40  . isosorbide mononitrate (IMDUR) 120 MG 24 hr tablet Take 1 tablet (120 mg total) by mouth daily.  Marland Kitchen levothyroxine (SYNTHROID, LEVOTHROID) 125 MCG tablet TAKE ONE TABLET BY MOUTH ONCE DAILY BEFORE BREAKFAST (Patient taking differently: Take 125 mcg by mouth daily before breakfast. )  . lidocaine-prilocaine (EMLA) cream Apply 1 application topically See admin instructions. Apply prior to dialysis treatments on Tuesday, Thursday and Saturday  . loperamide (IMODIUM A-D) 2 MG tablet Take 4 mg by mouth 2 (two) times daily as needed for diarrhea or loose stools.  Marland Kitchen NITROSTAT 0.4 MG SL tablet PLACE 1 TABLET UNDER THE TONGUE EVERY 5 MINUTES AS NEEDED FOR CHEST PAIN (MAX 3 DOSES PER DAY)  . oxyCODONE-acetaminophen (PERCOCET/ROXICET) 5-325 MG tablet Take 1 tablet by mouth as needed.  . pantoprazole (PROTONIX) 40 MG tablet Take 1 tablet (40 mg total) by mouth daily.  . sevelamer carbonate (RENVELA) 800 MG tablet Take 2,400-3,200 mg by mouth See admin instructions. Take 4 tablets (3200 mg) with meals and 3 tablets (2400 mg) with snacks  . sucralfate (CARAFATE) 1 g tablet Take 1 tablet (1 g total) by mouth 4 (four) times daily -  with meals and at bedtime.  .  [DISCONTINUED] carvedilol (COREG) 25 MG tablet Take 0.5 tablets (12.5 mg total) by mouth 2 (two) times daily with a meal.   Current Facility-Administered Medications for the 04/25/16 encounter (Office Visit) with Leonie Man, MD  Medication  . 0.9 %  sodium chloride infusion    Allergies  Allergen Reactions  . Brilinta [Ticagrelor] Shortness Of Breath  . Shellfish Allergy Other (See Comments)    Causes gout flare-ups    Social History   Social History  . Marital status: Married    Spouse name: N/A  . Number of children: 3  . Years of education: N/A   Social History Main Topics  . Smoking status: Never Smoker  . Smokeless tobacco: Never Used  . Alcohol use No  . Drug use: No  . Sexual activity: No   Other Topics Concern  . None   Social History Narrative   Long-term patient of Dr. Rollene Fare.   Married father of 40, grandfather 38.   Never smoked.   Not exercising D2 hip and back pain & now Angina    family history includes Diabetes in his father and mother; Heart attack in his father and mother; Heart disease in his father and mother; Hypertension in his father and mother; Stroke in his paternal aunt and paternal uncle.  Wt Readings from Last 3 Encounters:  04/25/16 145 lb (65.8 kg)  04/04/16 142 lb (64.4 kg)  03/28/16 145 lb (65.8 kg)    PHYSICAL EXAM BP 98/60   Pulse 71   Ht 5\' 5"  (1.651 m)   Wt 145 lb (65.8 kg)   BMI 24.13 kg/m  General appearance: Awake and alert. Increasingly more frail appearing. Chronically ill-appearing and he is well-groomed. Does answer questions appropriately. No acute  distress. HEENT: [place]/AT, EOMI, MMM, anicteric sclera Neck: no adenopathy, no carotid bruit and no JVD Lungs: CTA B, percussion and nonlabored. Heart: RRR with normal S1 and S2. He does have an S4 is a harsh 2/6 mid peaking C-D SEM in 2nd R intercostal space without radiation. Nondisplaced PMI. Abdomen: soft, non-tender; bowel sounds normal; no masses,  no  organomegaly; mild truncal obesity Extremities: extremities normal, atraumatic, no cyanosis, or edema. 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 -- His left hand is borderline cold, and there is questionable concern that he needs to have something done to the fistula and oriented to preserve his hand. Neurologic: Mental status: Alert, oriented, thought content appropriate    Adult ECG Report  Rate: 71 ;  Rhythm: normal sinus rhythm and LBBB. Otherwise no significant change.;   Narrative Interpretation: Stable EKG   Other studies Reviewed: Additional studies/ records that were reviewed today include:  Recent Labs:  Lab Results  Component Value Date   CHOL 107 08/10/2015   HDL 28.80 (L) 08/10/2015   LDLCALC 56 08/10/2015   LDLDIRECT 33.5 12/22/2012   TRIG 113.0 08/10/2015   CHOLHDL 4 08/10/2015   ASSESSMENT / PLAN: Problem List Items Addressed This Visit    CAD (coronary artery disease) of bypass graft - PCI to SVG-OM with BMS; PCI-mid RCA with a Xience DES - Primary (Chronic)   Relevant Medications   metoprolol succinate (TOPROL-XL) 50 MG 24 hr tablet   Other Relevant Orders   EKG 12-Lead (Completed)   Chronic combined systolic and diastolic congestive heart failure, NYHA class 2 (HCC) (Chronic)   Relevant Medications   metoprolol succinate (TOPROL-XL) 50 MG 24 hr tablet   Other Relevant Orders   EKG 12-Lead (Completed)   Chronic stable angina (HCC) (Chronic)    Really does elevated decubitus. Seems every of the whole little bit and it makes me feel like he is probably related to decreased volume removal because of low blood pressures to begin with. He is on max dose of Imdur. We will switch from carvedilol Toprol. This would be view of the reason for using his amlodipine.      Relevant Medications   metoprolol succinate (TOPROL-XL) 50 MG 24 hr tablet   Other Relevant Orders   EKG 12-Lead (Completed)   Coronary artery  disease involving native coronary artery of native heart with angina pectoris ([place]) (Chronic)    He is not having intractable angina at this time like he was in the past, he is having angina decubitus which is probably as much related to volume shifts from inadequate dialysis than anything else. Plan would be for him to take his Imdur more at dinnertime to allow for therapeutic effect when he gets to bed. This would hopefully help reduce how much when necessary nitroglycerin he requires. Unfortunately these are at peak dose of Imdur which remain in order to allow for tachyphylaxis to resolve, we would have to hold it for a while. Continue his Ranexa.      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 I would be reluctant to stop Plavix and Pletal combination because it seems to be helping. He is on stable dose of statin.      Hyperlipidemia with target LDL less than 70 (  Chronic)    Last lipid panel looks pretty good. With all his weight loss, I think we aren't doing quite well with the 10 mg Lipitor.      Relevant Medications   metoprolol succinate (TOPROL-XL) 50 MG 24 hr tablet   Hypotension of hemodialysis (Chronic)    Unfortunately, he now is to the point where he is so hypotensive that we will need to cut medications simply for him to get his full dialysis. I ordered cut his carvedilol dose now, but I think we need to go with less blood pressure response to medication. I do want continue the amlodipine for its antianginal effect. We first started that helped his angina quite a bit.  Plan will be to discontinue carvedilol and start Toprol 25 mg. I wanted to take the Toprol at bedtime or at dinner. This way the peak effect is not present during dialysis. He will continue to only take amlodipine on the post  dialysis evening, not pre. He is taking only one half of the amlodipine tablet. - If that doesn't work, I think we may have to cut the Toprol down to 12.5mg .      Relevant Medications   metoprolol succinate (TOPROL-XL) 50 MG 24 hr tablet   Other Relevant Orders   EKG 12-Lead (Completed)      Current medicines are reviewed at length with the patient today. (+/- concerns) Blood pressures too low with current medications The following changes have been made: see below  Patient Instructions   Medication change  -Stop carvedilol - start  toprol xl ( metoprolol succinate) 25 mg  at bedtime   If you are dizzy Or blood pressure is low - hold  Amlodipine 2.5 mg  The night before dialysis  Take imdur at dinner  Time each day.   Your physician recommends that you schedule a follow-up appointment in keep appointment for June 2018    Studies Ordered:   Orders Placed This Encounter  Procedures  . EKG 12-Lead      [name], M.D., M.S. Interventional Cardiologist   Pager # [phone] Phone # [phone] [address] [place], [place] [address]

## 2016-04-26 DIAGNOSIS — E876 Hypokalemia: Secondary | ICD-10-CM | POA: Diagnosis not present

## 2016-04-26 DIAGNOSIS — N186 End stage renal disease: Secondary | ICD-10-CM | POA: Diagnosis not present

## 2016-04-26 DIAGNOSIS — N2581 Secondary hyperparathyroidism of renal origin: Secondary | ICD-10-CM | POA: Diagnosis not present

## 2016-04-26 DIAGNOSIS — E1129 Type 2 diabetes mellitus with other diabetic kidney complication: Secondary | ICD-10-CM | POA: Diagnosis not present

## 2016-04-26 DIAGNOSIS — D631 Anemia in chronic kidney disease: Secondary | ICD-10-CM | POA: Diagnosis not present

## 2016-04-27 ENCOUNTER — Encounter: Payer: Self-pay | Admitting: Vascular Surgery

## 2016-04-27 ENCOUNTER — Encounter: Payer: Self-pay | Admitting: Cardiology

## 2016-04-27 NOTE — Assessment & Plan Note (Signed)
Continues to be doing very well now just about 2 years out from his last aggressive angioplasty. I would be reluctant to stop Plavix and Pletal combination because it seems to be helping. He is on stable dose of statin.

## 2016-04-27 NOTE — Assessment & Plan Note (Signed)
He is not having intractable angina at this time like he was in the past, he is having angina decubitus which is probably as much related to volume shifts from inadequate dialysis than anything else. Plan would be for him to take his Imdur more at dinnertime to allow for therapeutic effect when he gets to bed. This would hopefully help reduce how much when necessary nitroglycerin he requires. Unfortunately these are at peak dose of Imdur which remain in order to allow for tachyphylaxis to resolve, we would have to hold it for a while. Continue his Ranexa.

## 2016-04-27 NOTE — Assessment & Plan Note (Signed)
Unfortunately, he now is to the point where he is so hypotensive that we will need to cut medications simply for him to get his full dialysis. I ordered cut his carvedilol dose now, but I think we need to go with less blood pressure response to medication. I do want continue the amlodipine for its antianginal effect. We first started that helped his angina quite a bit.  Plan will be to discontinue carvedilol and start Toprol 25 mg. I wanted to take the Toprol at bedtime or at dinner. This way the peak effect is not present during dialysis. He will continue to only take amlodipine on the post dialysis evening, not pre. He is taking only one half of the amlodipine tablet. - If that doesn't work, I think we may have to cut the Toprol down to 12.5mg .

## 2016-04-27 NOTE — Assessment & Plan Note (Signed)
Really does elevated decubitus. Seems every of the whole little bit and it makes me feel like he is probably related to decreased volume removal because of low blood pressures to begin with. He is on max dose of Imdur. We will switch from carvedilol Toprol. This would be view of the reason for using his amlodipine.

## 2016-04-27 NOTE — Assessment & Plan Note (Signed)
Last lipid panel looks pretty good. With all his weight loss, I think we aren't doing quite well with the 10 mg Lipitor.

## 2016-04-28 DIAGNOSIS — E876 Hypokalemia: Secondary | ICD-10-CM | POA: Diagnosis not present

## 2016-04-28 DIAGNOSIS — N2581 Secondary hyperparathyroidism of renal origin: Secondary | ICD-10-CM | POA: Diagnosis not present

## 2016-04-28 DIAGNOSIS — E1129 Type 2 diabetes mellitus with other diabetic kidney complication: Secondary | ICD-10-CM | POA: Diagnosis not present

## 2016-04-28 DIAGNOSIS — D631 Anemia in chronic kidney disease: Secondary | ICD-10-CM | POA: Diagnosis not present

## 2016-04-28 DIAGNOSIS — N186 End stage renal disease: Secondary | ICD-10-CM | POA: Diagnosis not present

## 2016-05-01 DIAGNOSIS — D631 Anemia in chronic kidney disease: Secondary | ICD-10-CM | POA: Diagnosis not present

## 2016-05-01 DIAGNOSIS — N2581 Secondary hyperparathyroidism of renal origin: Secondary | ICD-10-CM | POA: Diagnosis not present

## 2016-05-01 DIAGNOSIS — E876 Hypokalemia: Secondary | ICD-10-CM | POA: Diagnosis not present

## 2016-05-01 DIAGNOSIS — E1129 Type 2 diabetes mellitus with other diabetic kidney complication: Secondary | ICD-10-CM | POA: Diagnosis not present

## 2016-05-01 DIAGNOSIS — N186 End stage renal disease: Secondary | ICD-10-CM | POA: Diagnosis not present

## 2016-05-03 DIAGNOSIS — N186 End stage renal disease: Secondary | ICD-10-CM | POA: Diagnosis not present

## 2016-05-03 DIAGNOSIS — E876 Hypokalemia: Secondary | ICD-10-CM | POA: Diagnosis not present

## 2016-05-03 DIAGNOSIS — E1129 Type 2 diabetes mellitus with other diabetic kidney complication: Secondary | ICD-10-CM | POA: Diagnosis not present

## 2016-05-03 DIAGNOSIS — D631 Anemia in chronic kidney disease: Secondary | ICD-10-CM | POA: Diagnosis not present

## 2016-05-03 DIAGNOSIS — N2581 Secondary hyperparathyroidism of renal origin: Secondary | ICD-10-CM | POA: Diagnosis not present

## 2016-05-04 ENCOUNTER — Other Ambulatory Visit: Payer: Self-pay | Admitting: *Deleted

## 2016-05-04 ENCOUNTER — Encounter: Payer: Self-pay | Admitting: *Deleted

## 2016-05-04 ENCOUNTER — Ambulatory Visit (INDEPENDENT_AMBULATORY_CARE_PROVIDER_SITE_OTHER): Payer: Medicare Other | Admitting: Vascular Surgery

## 2016-05-04 VITALS — BP 135/63 | HR 72 | Temp 97.3°F | Resp 16 | Ht 65.0 in | Wt 145.0 lb

## 2016-05-04 DIAGNOSIS — N186 End stage renal disease: Secondary | ICD-10-CM | POA: Diagnosis not present

## 2016-05-04 DIAGNOSIS — Z992 Dependence on renal dialysis: Secondary | ICD-10-CM

## 2016-05-04 NOTE — Progress Notes (Signed)
Patient ID: [name] [name], male   DOB: 12-17-1940, 76 y.o.   MRN: [id number]  Reason for Consult: Re-evaluation (Wants to discuss surgical options. )   Referred by Midge Minium, MD  Subjective:     HPI:  [name] [name] is a 76 y.o. male follows up from pain in his left hand that is constant and nonradiating. It is exacerbated with dialysis very little alleviating factors. He states it is taking his quality of life in all aspects and that his hand feels weaker than his right and is always numb. He has not had any constitutional symptoms. He does take Plavix and Pletal. At last visit he refused any further intervention but now wants to discuss proceeding with revascularization of his left hand.  Past Medical History:  Diagnosis Date  . Anemia    Likely secondary to history of GI bleed  . Anginal pain (HCC)    Chronic stable  . Anxiety   . Arthritis   . BPH (benign prostatic hyperplasia)   . CAD (coronary artery disease) of bypass graft 2006, 2012   In 2006: Occluded SVG-OM noted (SVG-diagonal was previously occluded); 2012: Severe lesion in redo SVG-OM1 --> BMS PCI   . CAD in native artery; and the grafts 1992, 1997, 2002, 2006, 2012   CABG 1992 and redo CABG 2006  . CAD S/P percutaneous coronary angioplasty October 2012; Feb, Apr & Oct 2015; Feb 2016   a) s/p CABG 1992 and redo 2006. b) 10/12: NSTEMI: PCI to SVG-OM1 Integrity BMS 3.5 x 15 (3.85 mm) c) 2/15: UA - PCI mid RCA Xience Ap DES 2.75 x 15 (3.1 mm). d) 4/15 : UA - focal dRCA Resolute DES 2.25 x 8 (2.5 mm). e) 10/15: PCI to mRCA ISR w/ post stent lesion - Promus P 2.75 x 16 (3.25 mm). f) 2/16: NSTEMI: CBA/PTCA of  mRCA ISR (3.5 mm post-dilation); g) 7/16 ISR RCA->CBA/PTCA, residual 20%.  . Carotid arterial disease ([place])    a) Duplex 05/2012: R BULB/PROX ICA: 50-69%, L BULB/PROX ICA: 0-49%. ;; 04/29/2014: 35-57% RICA, 32-20% LICA. Patetn vertebrals,  . Chronic low back pain   . Colon cancer [facility]) 2003   colectomy for CA.  no recurrence . never required  radiation or chem  . Diabetes mellitus    type -II  . Dyspnea    "when I get too much Fluid"  . End stage renal disease on dialysis [facility])    Dialyzes at [facility]: Tues/Th/Sat - left upper cavity AV fistula brachiocephalic  . Gout   . Heart murmur   . History of Non-ST elevated myocardial infarction (non-STEMI) 1992, 2006, 2012; 02/2013  . Hyperlipidemia   . Hypertension   . Hypothyroidism    On supplementation  . Ischemic cardiomyopathy    a) EF 45-50% by echo 2015. b) EF 50% by cath 04/2014.  Marland Kitchen LBBB (left bundle branch block)    Chronic  . Peptic ulcer disease  2004  . Plavix resistance    a) P2Y12 264 in 02/2014 - put on Brilinta but did not tolerate due to SOB. b) cannot be on Effient due to history of stroke. c) Plavix increased to 75mg  BID and Pletal added 04/2014 due to ISR.  Marland Kitchen Pneumonia   . Skin cancer 02/1999   nose  . Stroke [facility]) 1998   Family History  Problem Relation Age of Onset  . Heart disease Mother   . Hypertension Mother   . Diabetes Mother   .  Heart attack Mother   . Heart disease Father   . Hypertension Father   . Diabetes Father   . Heart attack Father   . Stroke Paternal Aunt   . Stroke Paternal Uncle   . Colon cancer Neg Hx   . Esophageal cancer Neg Hx    Past Surgical History:  Procedure Laterality Date  . APPENDECTOMY    . AV FISTULA REPAIR Left 05/2009  . CARDIAC CATHETERIZATION  10/16/2010   patent  LIMA to the LAD , PATENT  svg TO 2nd marginals ,occluded PLA with collaterals and SVG to the OM  had 90% stenosis  was txlge  bare - metal  3.5  Integrity ten  postdilate  36-37  mm    . CARDIAC CATHETERIZATION  03/22/2004   loss of both SVG,severe disease in prox and ostial  circ not amenable to invention. mod diease distal LAD  AFTER BYPASS GRAFT;-CVTS to evaluate   . CARDIAC CATHETERIZATION N/A 08/03/2014   Procedure: Left Heart Cath and Cors/Grafts Angiography;  Surgeon: Leonie Man, MD;  Location: [place] CV [facility];  Service: Cardiovascular;  Laterality: N/A;  . CARDIAC CATHETERIZATION N/A 08/03/2014   Procedure: Coronary Balloon Angioplasty;  Surgeon: Leonie Man, MD;  Location: [place] [facility];  Service: Cardiovascular;: Aggressive Cutting Balloon - [place] Balloon PTCA of ISR site in mRCA (3 stent layer)  . CAROTID DOPPLER  05/23/2012   ABN CAROTID-- RGT BULB/PROX ICA mild to mod 50-60%;lft bulb/prox mild to mod 0-49%;left subclavian abn waveforms consistent with patients lft arm A/V fistula  . CATARACT EXTRACTION W/ INTRAOCULAR LENS  IMPLANT, BILATERAL Bilateral   . CHOLECYSTECTOMY  2009  . COLON RESECTION  2003   transverse and proximal descending w/ primar anastomosis  . COLON SURGERY  2003   "cancer"  . COLONOSCOPY    . CORONARY ANGIOPLASTY  04/03/1995   OM and Circ  . CORONARY ANGIOPLASTY  02/01/2000   CIRC  . CORONARY ANGIOPLASTY  02/09/14   Cutting Balloon PTCA of mRCA ISR 99% - 3.5 mm  . CORONARY ARTERY BYPASS GRAFT  08/22/1990   INITIAL:LIMA to LAD, SVG to  Diagonal, SVG to OM  . CORONARY ARTERY BYPASS GRAFT  2006   SVG to OM1,SVG to OM2 with patent LIMA  to LAD and occluded vein  graft to the diagonal  and occluded vein grft to OM from  prior  surgery  . DOPPLER ECHOCARDIOGRAPHY  01/25/2012   EF 50 to 55%  . EVENT MONITOR  01/23/2012-02/06/2012   SINUS ,LBBB,unifocal PVCs  . LEFT AND RIGHT HEART CATHETERIZATION WITH CORONARY/GRAFT ANGIOGRAM N/A 04/20/2014   Procedure: LEFT AND RIGHT HEART CATHETERIZATION WITH Beatrix Fetters;  Surgeon: Sherren Mocha, MD;  Location: [facility] CATH LAB;  Service: Cardiovascular;  Laterality: N/A;  . LEFT HEART CATHETERIZATION WITH CORONARY ANGIOGRAM N/A 02/23/2013   Procedure: LEFT HEART CATHETERIZATION WITH CORONARY ANGIOGRAM;  Surgeon: Lorretta Harp, MD;  Location: [facility] CATH LAB;  Service: Cardiovascular;  Laterality: N/A;  . LEFT HEART CATHETERIZATION WITH CORONARY/GRAFT ANGIOGRAM N/A 04/30/2013   Procedure: LEFT HEART CATHETERIZATION  WITH Beatrix Fetters;  Surgeon: Leonie Man, MD;  Location: [facility] C[facility] LAB;  Service: Cardiovascular;  Laterality: N/A;  . LEFT HEART CATHETERIZATION WITH CORONARY/GRAFT ANGIOGRAM N/A 10/15/2013   Procedure: LEFT HEART CATHETERIZATION WITH Beatrix Fetters;  Surgeon: Leonie Man, MD;  Location: [facility] CATH LAB;  Service: Cardiovascular;  Laterality: N/A;  . LEFT HEART CATHETERIZATION WITH CORONARY/GRAFT ANGIOGRAM N/A 02/09/2014   Procedure: LEFT HEART  CATHETERIZATION WITH Beatrix Fetters;  Surgeon: Leonie Man, MD;  Location: [facility] CATH LAB;  Service: Cardiovascular;  Laterality: N/A;  . Lower Extremity Arterial Dopplers  October 2014   Calcified but non-occlusive peripheral arteries. No evidence of stenosis  . NM MYOCAR PERF WALL MOTION  10/12/2011   EF 54%,LV normal ; no signifiant ischemia  . PERCUTANEOUS CORONARY STENT INTERVENTION (PCI-S)  02/23/2013   mid RCA 80% & 70% - Xience 2.75 mm x 15 mm ( 3.85mm) ; LIMA-LAD patent, SVG-OM patent (stent patent); SVG-Diag patent.  Marland Kitchen PERCUTANEOUS CORONARY STENT INTERVENTION (PCI-S)  April 2015   dRCA - Integrity Resolute DES   2.25 mm x 1mm (2.5 mm)  . PERCUTANEOUS CORONARY STENT INTERVENTION (PCI-S)  Oct 15 2013   [place] Angina: mRCA ISR with post-stent stenosis -- Cuting PTCA & PCI Promus Premier DES 2.75 mm x 16 mm (3.25 mm)  . POSTERIOR LUMBAR FUSION    . REVISON OF ARTERIOVENOUS FISTULA Left 89/38/1017   Procedure: PLICATION OF LEFT UPPER ARM  ARTERIOVENOUS FISTULA  ANEURYSM;  Surgeon: Rosetta Posner, MD;  Location: [place];  Service: Vascular;  Laterality: Left;  . TRANSTHORACIC ECHOCARDIOGRAM  02/23/2013   Moderate concentric hypertrophy. EF 4500%. Septal bounce. Grade 2 diastolic dysfunction (pseudo-normal - severely elevated filling pressures) mild to moderate MR and moderate LA dilation to    Short Social History:  Social History  Substance Use Topics  . Smoking status: Never Smoker  . Smokeless tobacco: Never Used    . Alcohol use No    Allergies  Allergen Reactions  . Brilinta [Ticagrelor] Shortness Of Breath  . Shellfish Allergy Other (See Comments)    Causes gout flare-ups    Current Outpatient Prescriptions  Medication Sig Dispense Refill  . acetaminophen (TYLENOL) 500 MG tablet Take 1,000 mg by mouth every 6 (six) hours as needed for mild pain.    Marland Kitchen amLODipine (NORVASC) 5 MG tablet Take 0.5 tablet on non dialysis day  By mouth. 90 tablet 3  . Besifloxacin HCl (BESIVANCE) 0.6 % SUSP Place 1 drop into both eyes See admin instructions. Use eye drops 4 times daily for 2 days following injection by Dr. Zigmund Daniel (every 10 weeks) Next injection due 02-2016    . cilostazol (PLETAL) 100 MG tablet TAKE ONE TABLET BY MOUTH TWICE DAILY 180 tablet 1  . clonazePAM (KLONOPIN) 0.5 MG tablet TAKE ONE TABLET BY MOUTH TWICE DAILY AS NEEDED FOR ANXIETY 30 tablet 0  . clopidogrel (PLAVIX) 75 MG tablet Take 1 tablet (75 mg total) by mouth daily. 90 tablet 3  . ferric citrate (AURYXIA) 1 GM 210 MG(Fe) tablet Take 420 mg by mouth 3 (three) times daily with meals.    Marland Kitchen glucose blood (TRUE METRIX BLOOD GLUCOSE TEST) test strip 1 each by Other route as needed for other. Use as instructed to test sugars 2-3 times daily. Dx. E11.40 100 each 12  . isosorbide mononitrate (IMDUR) 120 MG 24 hr tablet Take 1 tablet (120 mg total) by mouth daily. 90 tablet 3  . levothyroxine (SYNTHROID, LEVOTHROID) 125 MCG tablet TAKE ONE TABLET BY MOUTH ONCE DAILY BEFORE BREAKFAST (Patient taking differently: Take 125 mcg by mouth daily before breakfast. ) 90 tablet 1  . lidocaine-prilocaine (EMLA) cream Apply 1 application topically See admin instructions. Apply prior to dialysis treatments on Tuesday, Thursday and Saturday    . loperamide (IMODIUM A-D) 2 MG tablet Take 4 mg by mouth 2 (two) times daily as needed for diarrhea or loose stools.    Marland Kitchen  metoprolol succinate (TOPROL-XL) 50 MG 24 hr tablet Take 1 tablet (50 mg total) by mouth at bedtime.  Take with or immediately following a meal. 90 tablet 3  . NITROSTAT 0.4 MG SL tablet PLACE 1 TABLET UNDER THE TONGUE EVERY 5 MINUTES AS NEEDED FOR CHEST PAIN (MAX 3 DOSES PER DAY) 25 tablet 3  . oxyCODONE-acetaminophen (PERCOCET/ROXICET) 5-325 MG tablet Take 1 tablet by mouth as needed.    . pantoprazole (PROTONIX) 40 MG tablet Take 1 tablet (40 mg total) by mouth daily. 90 tablet 3  . sevelamer carbonate (RENVELA) 800 MG tablet Take 2,400-3,200 mg by mouth See admin instructions. Take 4 tablets (3200 mg) with meals and 3 tablets (2400 mg) with snacks    . atorvastatin (LIPITOR) 20 MG tablet Take 0.5 tablets (10 mg total) by mouth daily. 90 tablet 3   Current Facility-Administered Medications  Medication Dose Route Frequency Provider Last Rate Last Dose  . 0.9 %  sodium chloride infusion  500 mL Intravenous Continuous Gatha Mayer, MD        Review of Systems  Musculoskeletal:       Left hand weakness and numbness       Objective:  Objective   Vitals:   05/04/16 1057  BP: 135/63  Pulse: 72  Resp: 16  Temp: 97.3 F (36.3 C)  TempSrc: Oral  SpO2: 98%  Weight: 145 lb (65.8 kg)  Height: 5\' 5"  (1.651 m)   Body mass index is 24.13 kg/m.  Physical Exam  Constitutional: He is oriented to person, place, and time. He appears well-developed.  Eyes: Pupils are equal, round, and reactive to light.  Neck: Normal range of motion.  Cardiovascular: Normal rate.   Left hand radial and palmar arch significantly augment with compression of avf  Pulmonary/Chest: Effort normal.  Abdominal: Soft.  Musculoskeletal: Normal range of motion. He exhibits no edema.  Neurological: He is alert and oriented to person, place, and time.  Skin: Skin is warm and dry.    Data: Last meal study could not compress radial artery.     Assessment/Plan:     76yo male with significant pain in his left hand with left upper arm AV fistula. Although he did not have a positive stools that he does have  significant augmentation of the signals at his wrist and hand with compression of his fistula. Given this we discussed options for ligation of fistula versus planning intervention to save the fistula and revascularize the hand. We will start with aortogram of the arch followed by left upper extremity angiogram to plan possible intervention. Patient can continue his Plavix. He demonstrates good understanding. He has had both of his saphenous veins harvested but does have a forearm cephalic vein that looks like would be suitable for DRIL if needed.     Waynetta Sandy MD [facility]

## 2016-05-05 DIAGNOSIS — N2581 Secondary hyperparathyroidism of renal origin: Secondary | ICD-10-CM | POA: Diagnosis not present

## 2016-05-05 DIAGNOSIS — N186 End stage renal disease: Secondary | ICD-10-CM | POA: Diagnosis not present

## 2016-05-07 DIAGNOSIS — N186 End stage renal disease: Secondary | ICD-10-CM | POA: Diagnosis not present

## 2016-05-07 DIAGNOSIS — Z992 Dependence on renal dialysis: Secondary | ICD-10-CM | POA: Diagnosis not present

## 2016-05-07 DIAGNOSIS — E1129 Type 2 diabetes mellitus with other diabetic kidney complication: Secondary | ICD-10-CM | POA: Diagnosis not present

## 2016-05-08 DIAGNOSIS — E1129 Type 2 diabetes mellitus with other diabetic kidney complication: Secondary | ICD-10-CM | POA: Diagnosis not present

## 2016-05-08 DIAGNOSIS — D631 Anemia in chronic kidney disease: Secondary | ICD-10-CM | POA: Diagnosis not present

## 2016-05-08 DIAGNOSIS — N186 End stage renal disease: Secondary | ICD-10-CM | POA: Diagnosis not present

## 2016-05-08 DIAGNOSIS — E876 Hypokalemia: Secondary | ICD-10-CM | POA: Diagnosis not present

## 2016-05-08 DIAGNOSIS — N2581 Secondary hyperparathyroidism of renal origin: Secondary | ICD-10-CM | POA: Diagnosis not present

## 2016-05-09 ENCOUNTER — Encounter (HOSPITAL_COMMUNITY)
Admission: RE | Disposition: A | Discharge status: Home / Self Care | Payer: Self-pay | Source: Ambulatory Visit | Attending: Vascular Surgery

## 2016-05-09 ENCOUNTER — Ambulatory Visit (HOSPITAL_COMMUNITY)
Admission: RE | Admit: 2016-05-09 | Discharge: 2016-05-09 | Disposition: A | Discharge status: Home / Self Care | Payer: Medicare Other | Source: Ambulatory Visit | Attending: Vascular Surgery | Admitting: Vascular Surgery

## 2016-05-09 ENCOUNTER — Encounter (HOSPITAL_COMMUNITY): Payer: Self-pay | Admitting: Vascular Surgery

## 2016-05-09 DIAGNOSIS — Q2549 Other congenital malformations of aorta: Secondary | ICD-10-CM | POA: Insufficient documentation

## 2016-05-09 DIAGNOSIS — N186 End stage renal disease: Secondary | ICD-10-CM | POA: Insufficient documentation

## 2016-05-09 DIAGNOSIS — Y832 Surgical operation with anastomosis, bypass or graft as the cause of abnormal reaction of the patient, or of later complication, without mention of misadventure at the time of the procedure: Secondary | ICD-10-CM | POA: Insufficient documentation

## 2016-05-09 DIAGNOSIS — Z992 Dependence on renal dialysis: Secondary | ICD-10-CM | POA: Insufficient documentation

## 2016-05-09 DIAGNOSIS — T82898A Other specified complication of vascular prosthetic devices, implants and grafts, initial encounter: Secondary | ICD-10-CM | POA: Diagnosis present

## 2016-05-09 HISTORY — PX: UPPER EXTREMITY ANGIOGRAPHY: CATH118270

## 2016-05-09 HISTORY — PX: AORTIC ARCH ANGIOGRAPHY: CATH118224

## 2016-05-09 LAB — POCT I-STAT, CHEM 8
BUN: 33 mg/dL — AB (ref 6–20)
CALCIUM ION: 1.18 mmol/L (ref 1.15–1.40)
CHLORIDE: 97 mmol/L — AB (ref 101–111)
CREATININE: 4.6 mg/dL — AB (ref 0.61–1.24)
GLUCOSE: 106 mg/dL — AB (ref 65–99)
HCT: 36 % — ABNORMAL LOW (ref 39.0–52.0)
Hemoglobin: 12.2 g/dL — ABNORMAL LOW (ref 13.0–17.0)
Potassium: 4.2 mmol/L (ref 3.5–5.1)
Sodium: 140 mmol/L (ref 135–145)
TCO2: 35 mmol/L (ref 0–100)

## 2016-05-09 LAB — POCT ACTIVATED CLOTTING TIME: Activated Clotting Time: 153 seconds

## 2016-05-09 LAB — GLUCOSE, CAPILLARY: GLUCOSE-CAPILLARY: 100 mg/dL — AB (ref 65–99)

## 2016-05-09 SURGERY — AORTIC ARCH ANGIOGRAPHY
Anesthesia: LOCAL

## 2016-05-09 MED ORDER — OXYCODONE-ACETAMINOPHEN 5-325 MG PO TABS: 1.0000 | ORAL_TABLET | ORAL | Status: DC | PRN

## 2016-05-09 MED ORDER — HEPARIN SODIUM (PORCINE) 1000 UNIT/ML IJ SOLN
INTRAMUSCULAR | Status: AC
  Filled 2016-05-09: qty 1

## 2016-05-09 MED ORDER — SODIUM CHLORIDE 0.9 % IV SOLN: 250.0000 mL | INTRAVENOUS | Status: DC | PRN

## 2016-05-09 MED ORDER — SODIUM CHLORIDE 0.9% FLUSH: 3.0000 mL | INTRAVENOUS | Status: DC | PRN

## 2016-05-09 MED ORDER — HEPARIN (PORCINE) IN NACL 2-0.9 UNIT/ML-% IJ SOLN
INTRAMUSCULAR | Status: DC | PRN
  Administered 2016-05-09: 1000 mL

## 2016-05-09 MED ORDER — SODIUM CHLORIDE 0.9% FLUSH: 3.0000 mL | Freq: Two times a day (BID) | INTRAVENOUS | Status: DC

## 2016-05-09 MED ORDER — HEPARIN SODIUM (PORCINE) 1000 UNIT/ML IJ SOLN
INTRAMUSCULAR | Status: DC | PRN
  Administered 2016-05-09: 3000 [IU] via INTRAVENOUS

## 2016-05-09 MED ORDER — IODIXANOL 320 MG/ML IV SOLN
INTRAVENOUS | Status: DC | PRN
  Administered 2016-05-09: 90 mL via INTRA_ARTERIAL

## 2016-05-09 MED ORDER — HEPARIN (PORCINE) IN NACL 2-0.9 UNIT/ML-% IJ SOLN
INTRAMUSCULAR | Status: AC
  Filled 2016-05-09: qty 1000

## 2016-05-09 MED ORDER — LIDOCAINE HCL (PF) 1 % IJ SOLN
INTRAMUSCULAR | Status: DC | PRN
  Administered 2016-05-09: 20 mL

## 2016-05-09 MED ORDER — LIDOCAINE HCL 1 % IJ SOLN
INTRAMUSCULAR | Status: AC
  Filled 2016-05-09: qty 20

## 2016-05-09 SURGICAL SUPPLY — 12 items
CATH ANGIO 5F BER2 100CM (CATHETERS) ×1 IMPLANT
CATH ANGIO 5F PIGTAIL 100CM (CATHETERS) ×1 IMPLANT
CATH QUICKCROSS .035X135CM (MICROCATHETER) ×1 IMPLANT
DEVICE TORQUE .025-.038 (MISCELLANEOUS) ×2 IMPLANT
GUIDEWIRE ANGLED .035X260CM (WIRE) ×2 IMPLANT
KIT MICROINTRODUCER STIFF 5F (SHEATH) ×1 IMPLANT
KIT PV (KITS) ×3 IMPLANT
SHEATH PINNACLE 5F 10CM (SHEATH) ×1 IMPLANT
SYR MEDRAD MARK V 150ML (SYRINGE) ×3 IMPLANT
TRANSDUCER W/STOPCOCK (MISCELLANEOUS) ×3 IMPLANT
TRAY PV CATH (CUSTOM PROCEDURE TRAY) ×3 IMPLANT
WIRE BENTSON .035X145CM (WIRE) ×2 IMPLANT

## 2016-05-09 NOTE — Progress Notes (Addendum)
Site area: RFA Site Prior to Removal:  Level 0 Pressure Applied For:67min Manual:  yes  Patient Status During Pull:  stable Post Pull Site:  Level 0 Post Pull Instructions Given: yes  Post Pull Pulses Present: palpable Dressing Applied: tegaderm  Bedrest begins @ 1010 till 1410 Comments:

## 2016-05-09 NOTE — Discharge Instructions (Signed)
Femoral Site Care Refer to this sheet in the next few weeks. These instructions provide you with information about caring for yourself after your procedure. Your health care provider may also give you more specific instructions. Your treatment has been planned according to current medical practices, but problems sometimes occur. Call your health care provider if you have any problems or questions after your procedure. What can I expect after the procedure? After your procedure, it is typical to have the following:  Bruising at the site that usually fades within 1-2 weeks.  Blood collecting in the tissue (hematoma) that may be painful to the touch. It should usually decrease in size and tenderness within 1-2 weeks. Follow these instructions at home:  Take medicines only as directed by your health care provider.  You may shower 24-48 hours after the procedure or as directed by your health care provider. Remove the bandage (dressing) and gently wash the site with plain soap and water. Pat the area dry with a clean towel. Do not rub the site, because this may cause bleeding.  Do not take baths, swim, or use a hot tub until your health care provider approves.  Check your insertion site every day for redness, swelling, or drainage.  Do not apply powder or lotion to the site.  Limit use of stairs to twice a day for the first 2-3 days or as directed by your health care provider.  Do not squat for the first 2-3 days or as directed by your health care provider.  Do not lift over 10 lb (4.5 kg) for 5 days after your procedure or as directed by your health care provider.  Ask your health care provider when it is okay to:  Return to work or school.  Resume usual physical activities or sports.  Resume sexual activity.  Do not drive home if you are discharged the same day as the procedure. Have someone else drive you.  You may drive 24 hours after the procedure unless otherwise instructed by  your health care provider.  Do not operate machinery or power tools for 24 hours after the procedure or as directed by your health care provider.  If your procedure was done as an outpatient procedure, which means that you went home the same day as your procedure, a responsible adult should be with you for the first 24 hours after you arrive home.  Keep all follow-up visits as directed by your health care provider. This is important. Contact a health care provider if:  You have a fever.  You have chills.  You have increased bleeding from the site. Hold pressure on the site. Get help right away if:  You have unusual pain at the site.  You have redness, warmth, or swelling at the site.  You have drainage (other than a small amount of blood on the dressing) from the site.  The site is bleeding, and the bleeding does not stop after 30 minutes of holding steady pressure on the site.  Your leg or foot becomes pale, cool, tingly, or numb. This information is not intended to replace advice given to you by your health care provider. Make sure you discuss any questions you have with your health care provider. Document Released: 08/28/2013 Document Revised: 06/02/2015 Document Reviewed: 07/14/2013 Elsevier Interactive Patient Education  2017 [employer] After This sheet gives you information about how to care for yourself after your procedure. Your doctor may also give you more specific instructions. If you have  problems or questions, contact your doctor. Follow these instructions at home: Insertion site care   Follow instructions from your doctor about how to take care of your long, thin tube (catheter) insertion area. Make sure you:  Wash your hands with soap and water before you change your bandage (dressing). If you cannot use soap and water, use hand sanitizer.  Change your bandage as told by your doctor.  Leave stitches (sutures), skin glue, or skin tape  (adhesive) strips in place. They may need to stay in place for 2 weeks or longer. If tape strips get loose and curl up, you may trim the loose edges. Do not remove tape strips completely unless your doctor says it is okay.  Do not take baths, swim, or use a hot tub until your doctor says it is okay.  You may shower 24-48 hours after the procedure or as told by your doctor.  Gently wash the area with plain soap and water.  Pat the area dry with a clean towel.  Do not rub the area. This may cause bleeding.  Do not apply powder or lotion to the area. Keep the area clean and dry.  Check your insertion area every day for signs of infection. Check for:  More redness, swelling, or pain.  Fluid or blood.  Warmth.  Pus or a bad smell. Activity   Rest as told by your doctor, usually for 1-2 days.  Do not lift anything that is heavier than 10 lbs. (4.5 kg) or as told by your doctor.  Do not drive for 24 hours if you were given a medicine to help you relax (sedative).  Do not drive or use heavy machinery while taking prescription pain medicine. General instructions   Go back to your normal activities as told by your doctor, usually in about a week. Ask your doctor what activities are safe for you.  If the insertion area starts to bleed, lie flat and put pressure on the area. If the bleeding does not stop, get help right away. This is an emergency.  Drink enough fluid to keep your pee (urine) clear or pale yellow.  Take over-the-counter and prescription medicines only as told by your doctor.  Keep all follow-up visits as told by your doctor. This is important. Contact a doctor if:  You have a fever.  You have chills.  You have more redness, swelling, or pain around your insertion area.  You have fluid or blood coming from your insertion area.  The insertion area feels warm to the touch.  You have pus or a bad smell coming from your insertion area.  You have more bruising  around the insertion area.  Blood collects in the tissue around the insertion area (hematoma) that may be painful to the touch. Get help right away if:  You have a lot of pain in the insertion area.  The insertion area swells very fast.  The insertion area is bleeding, and the bleeding does not stop after holding steady pressure on the area.  The area near or just beyond the insertion area becomes pale, cool, tingly, or numb. These symptoms may be an emergency. Do not wait to see if the symptoms will go away. Get medical help right away. Call your local emergency services (911 in the [place] Do not drive yourself to the hospital. Summary  After the procedure, it is common to have bruising and tenderness at the long, thin tube insertion area.  After the procedure, it is  important to rest and drink plenty of fluids.  Do not take baths, swim, or use a hot tub until your doctor says it is okay to do so. You may shower 24-48 hours after the procedure or as told by your doctor.  If the insertion area starts to bleed, lie flat and put pressure on the area. If the bleeding does not stop, get help right away. This is an emergency. This information is not intended to replace advice given to you by your health care provider. Make sure you discuss any questions you have with your health care provider. Document Released: 03/23/2008 Document Revised: 12/20/2015 Document Reviewed: 12/20/2015 Elsevier Interactive Patient Education  2017 [employer].

## 2016-05-09 NOTE — H&P (Signed)
   History and Physical Update  The patient was interviewed and re-examined.  The patient's previous History and Physical has been reviewed and is unchanged from office visit. Left upper extremity angiogram.   Rudolph Dobler C. Donzetta Matters, MD [facility] [place] Office: [phone] Pager: [phone]   05/09/2016, 8:13 AM

## 2016-05-09 NOTE — Op Note (Signed)
    Patient name: [name] [name] MRN: [id number] DOB: 07/18/1940 Sex: male  05/09/2016 Pre-operative Diagnosis: left upper extremity steal syndrome, esrd Post-operative diagnosis:  Same Surgeon:  Eda Paschal. Donzetta Matters, MD Procedure Performed: 1.  US guided cannulation of right common femoral artery 2.  Arch aortogram 3.  Left upper extremity angiogram 4.  Selective cannulation of left brachial artery 5.  [place] evaluation of right greater saphenous vein  Indications:  76 year old male history end-stage renal disease on dialysis on Tuesday Thursday and Saturday via left upper extremity AV fistula. He now has a cold and painful and exacerbated on dialysis but at all times and he has no palpable pulses at his wrist only signal augmented with compression of the avf. He is indicated for angiogram possible intervention and possible planning of further operative procedures.  Findings: Patient is a bovine arch and subclavian axillary artery are patent. Brachial artery is patent with brisk fistula flow with multiple pseudoaneurysms. He has runoff to his hand. His oral artery with normal trifurcation just below the antecubitum. He also has a sizable vein in his right groin for bypass procedure measuring 0.35 cm.   Procedure:  The patient was identified in the holding area and taken to room 8.  The patient was then placed supine on the table and prepped and draped in the usual sterile fashion.  A time out was called.  Ultrasound was used to evaluate the right common femoral artery.  It was patent .  A digital ultrasound image was acquired.  A micropuncture needle was used to access the right common femoral artery under ultrasound guidance.  An 018 wire was advanced without resistance and a micropuncture sheath was placed.  The 018 wire was removed and a benson wire was placed.  The micropuncture sheath was exchanged for a 5 french sheath.  An Britta Mccreedy wire was placed into the ascending aorta and placed pigtail catheter  performed arch angiography. We then used a pair catheter to select the left subclavian artery for left upper extremity angiogram with the above findings. We then selected the brachial artery on the left using quick cross catheter and Glidewire performed angiography demonstrating runoff to the hand only via the ulnar artery with quick reflux in the AV fistula. I then removed the catheter wire and used ultrasound to evaluate his greater saphenous vein his right groin which was greater than 0.3 cm for approximately 20 cm length. Procedure was then terminated we will plan for left upper extremity distal revascularization with interval ligation in the near future.  Contrast: 90cc  Isobella Ascher C. Donzetta Matters, MD [facility] [place] Office: [phone] Pager: [phone]

## 2016-05-10 ENCOUNTER — Telehealth: Payer: Self-pay

## 2016-05-10 DIAGNOSIS — E876 Hypokalemia: Secondary | ICD-10-CM | POA: Diagnosis not present

## 2016-05-10 DIAGNOSIS — D631 Anemia in chronic kidney disease: Secondary | ICD-10-CM | POA: Diagnosis not present

## 2016-05-10 DIAGNOSIS — N186 End stage renal disease: Secondary | ICD-10-CM | POA: Diagnosis not present

## 2016-05-10 DIAGNOSIS — N2581 Secondary hyperparathyroidism of renal origin: Secondary | ICD-10-CM | POA: Diagnosis not present

## 2016-05-10 DIAGNOSIS — E1129 Type 2 diabetes mellitus with other diabetic kidney complication: Secondary | ICD-10-CM | POA: Diagnosis not present

## 2016-05-10 NOTE — Telephone Encounter (Signed)
Pt. Advised to continue to take his Plavix and Pletal leading up to his surgery on 05/14/16.  Verb. Understanding.

## 2016-05-10 NOTE — Telephone Encounter (Signed)
-----   Message from Waynetta Sandy, MD sent at 05/10/2016  2:37 PM EDT ----- Regarding: RE: On Plavix and Pletal I suppose we should continue.   bc  ----- Message ----- From: Denman George, RN Sent: 05/10/2016  12:37 PM To: Waynetta Sandy, MD Subject: On Plavix and Pletal                           I have scheduled his DRIL procedure for Monday, 5/7; he is on Plavix and Pletal; should he continue these, preop?  Pt. Has hx of multiple PCI's with most recent on 07/2014; (hx. of DES in past also)

## 2016-05-11 ENCOUNTER — Other Ambulatory Visit: Payer: Self-pay

## 2016-05-11 ENCOUNTER — Encounter (HOSPITAL_COMMUNITY): Payer: Self-pay | Admitting: *Deleted

## 2016-05-11 NOTE — Progress Notes (Signed)
Anesthesia chart review: Same day workup.  See anesthesia note by Willeen Cass, FNP-BC from 10/27/2015. He is now for distal revascularization and interval ligation (DRIL) LUE, vein harvest RLE GSV on 05/14/16 by Dr. Donzetta Matters. He is on dialysis TTS. [facility] has instructed him to continue Pletal and Plavix pre-operatively. His last cardiology visit was with Dr. [name] on 04/25/16. Patient with known CAD (CABG; multiple PCI RCA, last 07/2014; not a candidate for redo CABG), ischemic CM, and chronic stable angina. Medication adjustments required (including some antianginals) at that visit due to significant hypotension with hemodialysis. No new testing ordered.  New results since 10/27/15 note included:  - EKG 05/05/16: NSR, left BBB (old).  - Carotid U/S 12/10/15: Stable 40-59% bilateral ICA stenosis. Greater than 50% right ECA stenosis. Patent vertebral arteries with antegrade flow. Elevated left subclavian artery velocities. Normal right subclavian arteries velocities.  Patient will get an ISTAT4 on arrival. Further evaluation by his anesthesiologist on the day of surgery.  [name] [name] [facility]/Anesthesiology Phone [phone] 05/11/2016 11:45 AM

## 2016-05-11 NOTE — Progress Notes (Signed)
Pt made aware to stop taking Melatonin. Pt verbalized understanding.

## 2016-05-11 NOTE — Progress Notes (Signed)
Pt denies any acute cardiopulmonary issues. Pt under the care of Dr. Ellyn Hack, Cardiology. Pt denies having a chest x ray within the last year. Pt made aware to stop taking vitamins, fish oil and herbal medications. Do not take any NSAIDs ie: Ibuprofen, Advil, Naproxen BC and Goody Powder. Pt made aware to check BG every 2 hours prior to arrival to hospital DOS, treat a BG< 70 with 4 glucose tabs, wait 15 minutes after taking tabs to recheck BG, if BG remains < 70 call SS. Pt verbalized understanding of all pre-op instructions.

## 2016-05-12 DIAGNOSIS — N2581 Secondary hyperparathyroidism of renal origin: Secondary | ICD-10-CM | POA: Diagnosis not present

## 2016-05-12 DIAGNOSIS — N186 End stage renal disease: Secondary | ICD-10-CM | POA: Diagnosis not present

## 2016-05-12 DIAGNOSIS — E876 Hypokalemia: Secondary | ICD-10-CM | POA: Diagnosis not present

## 2016-05-12 DIAGNOSIS — E1129 Type 2 diabetes mellitus with other diabetic kidney complication: Secondary | ICD-10-CM | POA: Diagnosis not present

## 2016-05-12 DIAGNOSIS — D631 Anemia in chronic kidney disease: Secondary | ICD-10-CM | POA: Diagnosis not present

## 2016-05-14 ENCOUNTER — Ambulatory Visit (HOSPITAL_COMMUNITY): Payer: Medicare Other | Admitting: Vascular Surgery

## 2016-05-14 ENCOUNTER — Inpatient Hospital Stay (HOSPITAL_COMMUNITY)
Admission: RE | Admit: 2016-05-14 | Discharge: 2016-05-15 | DRG: 252 | Disposition: A | Discharge status: Home / Self Care | Payer: Medicare Other | Source: Ambulatory Visit | Attending: Vascular Surgery | Admitting: Vascular Surgery

## 2016-05-14 ENCOUNTER — Encounter (HOSPITAL_COMMUNITY): Payer: Self-pay | Admitting: *Deleted

## 2016-05-14 ENCOUNTER — Encounter (HOSPITAL_COMMUNITY)
Admission: RE | Disposition: A | Discharge status: Home / Self Care | Payer: Self-pay | Source: Ambulatory Visit | Attending: Vascular Surgery

## 2016-05-14 DIAGNOSIS — E8889 Other specified metabolic disorders: Secondary | ICD-10-CM | POA: Diagnosis present

## 2016-05-14 DIAGNOSIS — Z9049 Acquired absence of other specified parts of digestive tract: Secondary | ICD-10-CM

## 2016-05-14 DIAGNOSIS — Z9841 Cataract extraction status, right eye: Secondary | ICD-10-CM

## 2016-05-14 DIAGNOSIS — Z85038 Personal history of other malignant neoplasm of large intestine: Secondary | ICD-10-CM | POA: Diagnosis not present

## 2016-05-14 DIAGNOSIS — Z951 Presence of aortocoronary bypass graft: Secondary | ICD-10-CM

## 2016-05-14 DIAGNOSIS — Z955 Presence of coronary angioplasty implant and graft: Secondary | ICD-10-CM | POA: Diagnosis not present

## 2016-05-14 DIAGNOSIS — Z823 Family history of stroke: Secondary | ICD-10-CM

## 2016-05-14 DIAGNOSIS — D631 Anemia in chronic kidney disease: Secondary | ICD-10-CM | POA: Diagnosis present

## 2016-05-14 DIAGNOSIS — Z9842 Cataract extraction status, left eye: Secondary | ICD-10-CM | POA: Diagnosis not present

## 2016-05-14 DIAGNOSIS — Z888 Allergy status to other drugs, medicaments and biological substances status: Secondary | ICD-10-CM

## 2016-05-14 DIAGNOSIS — Y838 Other surgical procedures as the cause of abnormal reaction of the patient, or of later complication, without mention of misadventure at the time of the procedure: Secondary | ICD-10-CM | POA: Diagnosis present

## 2016-05-14 DIAGNOSIS — I255 Ischemic cardiomyopathy: Secondary | ICD-10-CM | POA: Diagnosis present

## 2016-05-14 DIAGNOSIS — Z961 Presence of intraocular lens: Secondary | ICD-10-CM | POA: Diagnosis present

## 2016-05-14 DIAGNOSIS — E785 Hyperlipidemia, unspecified: Secondary | ICD-10-CM | POA: Diagnosis present

## 2016-05-14 DIAGNOSIS — N185 Chronic kidney disease, stage 5: Secondary | ICD-10-CM | POA: Diagnosis not present

## 2016-05-14 DIAGNOSIS — I252 Old myocardial infarction: Secondary | ICD-10-CM | POA: Diagnosis not present

## 2016-05-14 DIAGNOSIS — R1013 Epigastric pain: Secondary | ICD-10-CM

## 2016-05-14 DIAGNOSIS — Z8249 Family history of ischemic heart disease and other diseases of the circulatory system: Secondary | ICD-10-CM

## 2016-05-14 DIAGNOSIS — M79642 Pain in left hand: Secondary | ICD-10-CM | POA: Diagnosis not present

## 2016-05-14 DIAGNOSIS — E039 Hypothyroidism, unspecified: Secondary | ICD-10-CM | POA: Diagnosis present

## 2016-05-14 DIAGNOSIS — Z91013 Allergy to seafood: Secondary | ICD-10-CM

## 2016-05-14 DIAGNOSIS — T82510A Breakdown (mechanical) of surgically created arteriovenous fistula, initial encounter: Secondary | ICD-10-CM | POA: Diagnosis present

## 2016-05-14 DIAGNOSIS — I5042 Chronic combined systolic (congestive) and diastolic (congestive) heart failure: Secondary | ICD-10-CM | POA: Diagnosis not present

## 2016-05-14 DIAGNOSIS — M545 Low back pain: Secondary | ICD-10-CM | POA: Diagnosis present

## 2016-05-14 DIAGNOSIS — M109 Gout, unspecified: Secondary | ICD-10-CM | POA: Diagnosis present

## 2016-05-14 DIAGNOSIS — Z8673 Personal history of transient ischemic attack (TIA), and cerebral infarction without residual deficits: Secondary | ICD-10-CM

## 2016-05-14 DIAGNOSIS — I447 Left bundle-branch block, unspecified: Secondary | ICD-10-CM | POA: Diagnosis present

## 2016-05-14 DIAGNOSIS — I25119 Atherosclerotic heart disease of native coronary artery with unspecified angina pectoris: Secondary | ICD-10-CM | POA: Diagnosis present

## 2016-05-14 DIAGNOSIS — N400 Enlarged prostate without lower urinary tract symptoms: Secondary | ICD-10-CM | POA: Diagnosis present

## 2016-05-14 DIAGNOSIS — I120 Hypertensive chronic kidney disease with stage 5 chronic kidney disease or end stage renal disease: Secondary | ICD-10-CM | POA: Diagnosis present

## 2016-05-14 DIAGNOSIS — Z85828 Personal history of other malignant neoplasm of skin: Secondary | ICD-10-CM

## 2016-05-14 DIAGNOSIS — N2581 Secondary hyperparathyroidism of renal origin: Secondary | ICD-10-CM | POA: Diagnosis not present

## 2016-05-14 DIAGNOSIS — Z8711 Personal history of peptic ulcer disease: Secondary | ICD-10-CM

## 2016-05-14 DIAGNOSIS — N186 End stage renal disease: Secondary | ICD-10-CM | POA: Diagnosis present

## 2016-05-14 DIAGNOSIS — Z992 Dependence on renal dialysis: Secondary | ICD-10-CM | POA: Diagnosis not present

## 2016-05-14 DIAGNOSIS — F419 Anxiety disorder, unspecified: Secondary | ICD-10-CM | POA: Diagnosis present

## 2016-05-14 DIAGNOSIS — T82898A Other specified complication of vascular prosthetic devices, implants and grafts, initial encounter: Secondary | ICD-10-CM | POA: Diagnosis not present

## 2016-05-14 DIAGNOSIS — Z833 Family history of diabetes mellitus: Secondary | ICD-10-CM

## 2016-05-14 DIAGNOSIS — G8929 Other chronic pain: Secondary | ICD-10-CM | POA: Diagnosis present

## 2016-05-14 HISTORY — DX: Personal history of other diseases of the digestive system: Z87.19

## 2016-05-14 HISTORY — PX: DISTAL REVASCULARIZATION AND INTERVAL LIGATION (DRIL): SHX6669

## 2016-05-14 LAB — POCT I-STAT 4, (NA,K, GLUC, HGB,HCT)
Glucose, Bld: 131 mg/dL — ABNORMAL HIGH (ref 65–99)
HEMATOCRIT: 31 % — AB (ref 39.0–52.0)
HEMOGLOBIN: 10.5 g/dL — AB (ref 13.0–17.0)
Potassium: 4.2 mmol/L (ref 3.5–5.1)
Sodium: 140 mmol/L (ref 135–145)

## 2016-05-14 LAB — CBC
HCT: 31.9 % — ABNORMAL LOW (ref 39.0–52.0)
HEMOGLOBIN: 10.2 g/dL — AB (ref 13.0–17.0)
MCH: 30.4 pg (ref 26.0–34.0)
MCHC: 32 g/dL (ref 30.0–36.0)
MCV: 94.9 fL (ref 78.0–100.0)
PLATELETS: 226 10*3/uL (ref 150–400)
RBC: 3.36 MIL/uL — AB (ref 4.22–5.81)
RDW: 14.4 % (ref 11.5–15.5)
WBC: 11.3 10*3/uL — ABNORMAL HIGH (ref 4.0–10.5)

## 2016-05-14 LAB — BASIC METABOLIC PANEL
Anion gap: 9 (ref 5–15)
BUN: 46 mg/dL — AB (ref 6–20)
CHLORIDE: 105 mmol/L (ref 101–111)
CO2: 25 mmol/L (ref 22–32)
Calcium: 9.1 mg/dL (ref 8.9–10.3)
Creatinine, Ser: 5.62 mg/dL — ABNORMAL HIGH (ref 0.61–1.24)
GFR calc Af Amer: 10 mL/min — ABNORMAL LOW (ref >60)
GFR calc non Af Amer: 9 mL/min — ABNORMAL LOW (ref >60)
GLUCOSE: 153 mg/dL — AB (ref 65–99)
POTASSIUM: 4.3 mmol/L (ref 3.5–5.1)
Sodium: 139 mmol/L (ref 135–145)

## 2016-05-14 LAB — GLUCOSE, CAPILLARY: Glucose-Capillary: 119 mg/dL — ABNORMAL HIGH (ref 65–99)

## 2016-05-14 SURGERY — DISTAL REVASCULARIZATION AND INTERVAL LIGATION PROCEDURE
Anesthesia: General | Site: Arm Upper | Laterality: Left

## 2016-05-14 MED ORDER — HYDROMORPHONE HCL 1 MG/ML IJ SOLN
0.5000 mg | INTRAMUSCULAR | Status: DC | PRN
  Administered 2016-05-14: 1 mg via INTRAVENOUS
  Filled 2016-05-14: qty 1

## 2016-05-14 MED ORDER — FENTANYL CITRATE (PF) 100 MCG/2ML IJ SOLN
INTRAMUSCULAR | Status: AC
  Administered 2016-05-14: 50 ug via INTRAVENOUS
  Filled 2016-05-14: qty 2

## 2016-05-14 MED ORDER — SODIUM CHLORIDE 0.9% FLUSH
3.0000 mL | Freq: Two times a day (BID) | INTRAVENOUS | Status: DC
  Administered 2016-05-14 – 2016-05-15 (×2): 3 mL via INTRAVENOUS

## 2016-05-14 MED ORDER — CEFUROXIME SODIUM 1.5 G IJ SOLR
1.5000 g | INTRAMUSCULAR | Status: AC
  Administered 2016-05-14: 1.5 g via INTRAVENOUS

## 2016-05-14 MED ORDER — NITROGLYCERIN 0.4 MG SL SUBL: 0.4000 mg | SUBLINGUAL_TABLET | SUBLINGUAL | Status: DC | PRN

## 2016-05-14 MED ORDER — LEVOTHYROXINE SODIUM 25 MCG PO TABS
125.0000 ug | ORAL_TABLET | Freq: Every day | ORAL | Status: DC
  Administered 2016-05-15: 125 ug via ORAL
  Filled 2016-05-14: qty 1

## 2016-05-14 MED ORDER — SODIUM CHLORIDE 0.9% FLUSH
3.0000 mL | INTRAVENOUS | Status: DC | PRN
Start: 2016-05-14 — End: 2016-05-15

## 2016-05-14 MED ORDER — ATORVASTATIN CALCIUM 10 MG PO TABS
10.0000 mg | ORAL_TABLET | Freq: Every day | ORAL | Status: DC
  Administered 2016-05-14 – 2016-05-15 (×2): 10 mg via ORAL
  Filled 2016-05-14 (×2): qty 1

## 2016-05-14 MED ORDER — METOPROLOL SUCCINATE ER 50 MG PO TB24
50.0000 mg | ORAL_TABLET | Freq: Every day | ORAL | Status: DC
  Administered 2016-05-14: 50 mg via ORAL
  Filled 2016-05-14: qty 1

## 2016-05-14 MED ORDER — PANTOPRAZOLE SODIUM 40 MG PO TBEC
40.0000 mg | DELAYED_RELEASE_TABLET | Freq: Every day | ORAL | Status: DC
  Administered 2016-05-15: 40 mg via ORAL
  Filled 2016-05-14: qty 1

## 2016-05-14 MED ORDER — ONDANSETRON HCL 4 MG/2ML IJ SOLN: 4.0000 mg | Freq: Four times a day (QID) | INTRAMUSCULAR | Status: DC | PRN

## 2016-05-14 MED ORDER — OXYCODONE-ACETAMINOPHEN 5-325 MG PO TABS
1.0000 | ORAL_TABLET | ORAL | Status: DC | PRN
  Administered 2016-05-14: 1 via ORAL
  Administered 2016-05-14 – 2016-05-15 (×2): 2 via ORAL
  Administered 2016-05-15: 1 via ORAL
  Administered 2016-05-15: 2 via ORAL
  Filled 2016-05-14 (×3): qty 2

## 2016-05-14 MED ORDER — SODIUM CHLORIDE 0.9 % IV SOLN
INTRAVENOUS | Status: DC | PRN
  Administered 2016-05-14: 11:00:00

## 2016-05-14 MED ORDER — DOCUSATE SODIUM 100 MG PO CAPS
100.0000 mg | ORAL_CAPSULE | Freq: Two times a day (BID) | ORAL | Status: DC
  Administered 2016-05-14: 100 mg via ORAL
  Filled 2016-05-14: qty 1

## 2016-05-14 MED ORDER — LABETALOL HCL 5 MG/ML IV SOLN: 10.0000 mg | INTRAVENOUS | Status: DC | PRN

## 2016-05-14 MED ORDER — SODIUM CHLORIDE 0.9 % IV SOLN: INTRAVENOUS | Status: DC

## 2016-05-14 MED ORDER — SENNOSIDES-DOCUSATE SODIUM 8.6-50 MG PO TABS: 1.0000 | ORAL_TABLET | Freq: Every evening | ORAL | Status: DC | PRN

## 2016-05-14 MED ORDER — POTASSIUM CHLORIDE CRYS ER 20 MEQ PO TBCR: 20.0000 meq | EXTENDED_RELEASE_TABLET | Freq: Once | ORAL | Status: DC

## 2016-05-14 MED ORDER — PHENOL 1.4 % MT LIQD: 1.0000 | OROMUCOSAL | Status: DC | PRN

## 2016-05-14 MED ORDER — DEXTROSE 5 % IV SOLN
INTRAVENOUS | Status: AC
  Filled 2016-05-14: qty 1.5

## 2016-05-14 MED ORDER — LOPERAMIDE HCL 2 MG PO CAPS: 4.0000 mg | ORAL_CAPSULE | Freq: Two times a day (BID) | ORAL | Status: DC | PRN

## 2016-05-14 MED ORDER — LIDOCAINE-PRILOCAINE 2.5-2.5 % EX CREA: 1.0000 "application " | TOPICAL_CREAM | CUTANEOUS | Status: DC

## 2016-05-14 MED ORDER — PROPOFOL 10 MG/ML IV BOLUS
INTRAVENOUS | Status: DC | PRN
  Administered 2016-05-14: 160 mg via INTRAVENOUS

## 2016-05-14 MED ORDER — FERRIC CITRATE 1 GM 210 MG(FE) PO TABS
420.0000 mg | ORAL_TABLET | Freq: Three times a day (TID) | ORAL | Status: DC
  Administered 2016-05-14 – 2016-05-15 (×3): 420 mg via ORAL
  Filled 2016-05-14 (×4): qty 2

## 2016-05-14 MED ORDER — SODIUM CHLORIDE 0.9 % IV SOLN: 500.0000 mL | INTRAVENOUS | Status: DC

## 2016-05-14 MED ORDER — GUAIFENESIN-DM 100-10 MG/5ML PO SYRP
15.0000 mL | ORAL_SOLUTION | ORAL | Status: DC | PRN
Start: 2016-05-14 — End: 2016-05-15

## 2016-05-14 MED ORDER — HEPARIN SODIUM (PORCINE) 1000 UNIT/ML IJ SOLN
INTRAMUSCULAR | Status: DC | PRN
  Administered 2016-05-14: 7000 [IU] via INTRAVENOUS

## 2016-05-14 MED ORDER — ISOSORBIDE MONONITRATE ER 60 MG PO TB24
120.0000 mg | ORAL_TABLET | Freq: Every day | ORAL | Status: DC
  Administered 2016-05-14 – 2016-05-15 (×2): 120 mg via ORAL
  Filled 2016-05-14 (×2): qty 2

## 2016-05-14 MED ORDER — FENTANYL CITRATE (PF) 250 MCG/5ML IJ SOLN
INTRAMUSCULAR | Status: AC
  Filled 2016-05-14: qty 5

## 2016-05-14 MED ORDER — CILOSTAZOL 100 MG PO TABS
100.0000 mg | ORAL_TABLET | Freq: Two times a day (BID) | ORAL | Status: DC
  Administered 2016-05-14 – 2016-05-15 (×2): 100 mg via ORAL
  Filled 2016-05-14 (×3): qty 1

## 2016-05-14 MED ORDER — SODIUM CHLORIDE 0.9 % IV SOLN
INTRAVENOUS | Status: DC
  Administered 2016-05-14 (×2): via INTRAVENOUS

## 2016-05-14 MED ORDER — CLOPIDOGREL BISULFATE 75 MG PO TABS
75.0000 mg | ORAL_TABLET | Freq: Every day | ORAL | Status: DC
  Administered 2016-05-15: 75 mg via ORAL
  Filled 2016-05-14: qty 1

## 2016-05-14 MED ORDER — HEMOSTATIC AGENTS (NO CHARGE) OPTIME
TOPICAL | Status: DC | PRN
  Administered 2016-05-14: 1 via TOPICAL

## 2016-05-14 MED ORDER — ENOXAPARIN SODIUM 30 MG/0.3ML ~~LOC~~ SOLN
30.0000 mg | SUBCUTANEOUS | Status: DC
  Administered 2016-05-15: 30 mg via SUBCUTANEOUS
  Filled 2016-05-14: qty 0.3

## 2016-05-14 MED ORDER — ONDANSETRON HCL 4 MG/2ML IJ SOLN
INTRAMUSCULAR | Status: DC | PRN
  Administered 2016-05-14: 4 mg via INTRAVENOUS

## 2016-05-14 MED ORDER — 0.9 % SODIUM CHLORIDE (POUR BTL) OPTIME
TOPICAL | Status: DC | PRN
  Administered 2016-05-14: 1000 mL

## 2016-05-14 MED ORDER — EPHEDRINE SULFATE 50 MG/ML IJ SOLN
INTRAMUSCULAR | Status: DC | PRN
  Administered 2016-05-14 (×2): 5 mg via INTRAVENOUS
  Administered 2016-05-14: 10 mg via INTRAVENOUS
  Administered 2016-05-14: 5 mg via INTRAVENOUS

## 2016-05-14 MED ORDER — METOPROLOL TARTRATE 5 MG/5ML IV SOLN: 2.0000 mg | INTRAVENOUS | Status: DC | PRN

## 2016-05-14 MED ORDER — HEPARIN SODIUM (PORCINE) 1000 UNIT/ML IJ SOLN
INTRAMUSCULAR | Status: AC
  Filled 2016-05-14: qty 1

## 2016-05-14 MED ORDER — FENTANYL CITRATE (PF) 100 MCG/2ML IJ SOLN
25.0000 ug | INTRAMUSCULAR | Status: DC | PRN
Start: 2016-05-14 — End: 2016-05-14
  Administered 2016-05-14: 25 ug via INTRAVENOUS
  Administered 2016-05-14: 50 ug via INTRAVENOUS
  Administered 2016-05-14: 25 ug via INTRAVENOUS

## 2016-05-14 MED ORDER — DEXTROSE 5 % IV SOLN
1.5000 g | Freq: Two times a day (BID) | INTRAVENOUS | Status: AC
  Administered 2016-05-14: 1.5 g via INTRAVENOUS
  Filled 2016-05-14: qty 1.5

## 2016-05-14 MED ORDER — PROTAMINE SULFATE 10 MG/ML IV SOLN
INTRAVENOUS | Status: DC | PRN
  Administered 2016-05-14: 25 mg via INTRAVENOUS

## 2016-05-14 MED ORDER — ALUM & MAG HYDROXIDE-SIMETH 200-200-20 MG/5ML PO SUSP: 15.0000 mL | ORAL | Status: DC | PRN

## 2016-05-14 MED ORDER — CHLORHEXIDINE GLUCONATE CLOTH 2 % EX PADS: 6.0000 | MEDICATED_PAD | Freq: Once | CUTANEOUS | Status: DC

## 2016-05-14 MED ORDER — HYDRALAZINE HCL 20 MG/ML IJ SOLN: 5.0000 mg | INTRAMUSCULAR | Status: DC | PRN

## 2016-05-14 MED ORDER — GATIFLOXACIN 0.5 % OP SOLN: 1.0000 [drp] | Freq: Four times a day (QID) | OPHTHALMIC | Status: DC

## 2016-05-14 MED ORDER — PHENYLEPHRINE HCL 10 MG/ML IJ SOLN
INTRAVENOUS | Status: DC | PRN
  Administered 2016-05-14: 30 ug/min via INTRAVENOUS

## 2016-05-14 MED ORDER — ACETAMINOPHEN 500 MG PO TABS
1000.0000 mg | ORAL_TABLET | Freq: Two times a day (BID) | ORAL | Status: DC | PRN
  Administered 2016-05-15: 650 mg via ORAL

## 2016-05-14 MED ORDER — AMLODIPINE BESYLATE 5 MG PO TABS
5.0000 mg | ORAL_TABLET | Freq: Every day | ORAL | Status: DC
  Administered 2016-05-14 – 2016-05-15 (×2): 5 mg via ORAL
  Filled 2016-05-14 (×2): qty 1

## 2016-05-14 MED ORDER — CLONAZEPAM 0.5 MG PO TABS: 0.5000 mg | ORAL_TABLET | Freq: Two times a day (BID) | ORAL | Status: DC | PRN

## 2016-05-14 MED ORDER — SODIUM CHLORIDE 0.9 % IV SOLN: 250.0000 mL | INTRAVENOUS | Status: DC | PRN

## 2016-05-14 MED ORDER — LIDOCAINE HCL (CARDIAC) 20 MG/ML IV SOLN
INTRAVENOUS | Status: DC | PRN
  Administered 2016-05-14: 100 mg via INTRAVENOUS

## 2016-05-14 SURGICAL SUPPLY — 70 items
ADH SKN CLS APL DERMABOND .7 (GAUZE/BANDAGES/DRESSINGS) ×4
AGENT HMST SPONGE THK3/8 (HEMOSTASIS) ×2
ARMBAND PINK RESTRICT EXTREMIT (MISCELLANEOUS) ×4 IMPLANT
BANDAGE ACE 4X5 VEL STRL LF (GAUZE/BANDAGES/DRESSINGS) IMPLANT
BANDAGE ESMARK 6X9 LF (GAUZE/BANDAGES/DRESSINGS) IMPLANT
BNDG CMPR 9X6 STRL LF SNTH (GAUZE/BANDAGES/DRESSINGS)
BNDG ESMARK 6X9 LF (GAUZE/BANDAGES/DRESSINGS)
CANISTER SUCT 3000ML PPV (MISCELLANEOUS) ×4 IMPLANT
CANNULA VESSEL 3MM 2 BLNT TIP (CANNULA) IMPLANT
CLIP TI MEDIUM 24 (CLIP) ×4 IMPLANT
CLIP TI MEDIUM 6 (CLIP) ×4 IMPLANT
CLIP TI WIDE RED SMALL 24 (CLIP) ×4 IMPLANT
CLIP TI WIDE RED SMALL 6 (CLIP) ×1 IMPLANT
COVER PROBE W GEL 5X96 (DRAPES) ×3 IMPLANT
COVER SURGICAL LIGHT HANDLE (MISCELLANEOUS) ×3 IMPLANT
CUFF TOURNIQUET SINGLE 24IN (TOURNIQUET CUFF) IMPLANT
CUFF TOURNIQUET SINGLE 34IN LL (TOURNIQUET CUFF) IMPLANT
CUFF TOURNIQUET SINGLE 44IN (TOURNIQUET CUFF) IMPLANT
DERMABOND ADVANCED (GAUZE/BANDAGES/DRESSINGS) ×4
DERMABOND ADVANCED .7 DNX12 (GAUZE/BANDAGES/DRESSINGS) ×4 IMPLANT
DRAIN CHANNEL 15F RND FF W/TCR (WOUND CARE) IMPLANT
DRAPE HALF SHEET 40X57 (DRAPES) ×3 IMPLANT
DRAPE X-RAY CASS 24X20 (DRAPES) IMPLANT
ELECT REM PT RETURN 9FT ADLT (ELECTROSURGICAL) ×4
ELECTRODE REM PT RTRN 9FT ADLT (ELECTROSURGICAL) ×2 IMPLANT
EVACUATOR SILICONE 100CC (DRAIN) IMPLANT
GLOVE BIO SURGEON STRL SZ7.5 (GLOVE) ×4 IMPLANT
GLOVE BIOGEL PI IND STRL 6.5 (GLOVE) ×2 IMPLANT
GLOVE BIOGEL PI IND STRL 7.0 (GLOVE) ×2 IMPLANT
GLOVE BIOGEL PI IND STRL 7.5 (GLOVE) ×2 IMPLANT
GLOVE BIOGEL PI INDICATOR 6.5 (GLOVE) ×4
GLOVE BIOGEL PI INDICATOR 7.0 (GLOVE) ×4
GLOVE BIOGEL PI INDICATOR 7.5 (GLOVE) ×2
GLOVE ECLIPSE 6.5 STRL STRAW (GLOVE) ×6 IMPLANT
GLOVE ECLIPSE 7.0 STRL STRAW (GLOVE) ×4 IMPLANT
GLOVE SURG SS PI 6.5 STRL IVOR (GLOVE) ×3 IMPLANT
GOWN STRL REUS W/ TWL LRG LVL3 (GOWN DISPOSABLE) ×7 IMPLANT
GOWN STRL REUS W/ TWL XL LVL3 (GOWN DISPOSABLE) ×2 IMPLANT
GOWN STRL REUS W/TWL LRG LVL3 (GOWN DISPOSABLE) ×20
GOWN STRL REUS W/TWL XL LVL3 (GOWN DISPOSABLE) ×4
HEMOSTAT SPONGE AVITENE ULTRA (HEMOSTASIS) ×3 IMPLANT
INSERT FOGARTY SM (MISCELLANEOUS) IMPLANT
KIT BASIN OR (CUSTOM PROCEDURE TRAY) ×4 IMPLANT
KIT ROOM TURNOVER OR (KITS) ×4 IMPLANT
MARKER GRAFT CORONARY BYPASS (MISCELLANEOUS) IMPLANT
NS IRRIG 1000ML POUR BTL (IV SOLUTION) ×5 IMPLANT
PACK CV ACCESS (CUSTOM PROCEDURE TRAY) IMPLANT
PACK PERIPHERAL VASCULAR (CUSTOM PROCEDURE TRAY) ×4 IMPLANT
PAD ARMBOARD 7.5X6 YLW CONV (MISCELLANEOUS) ×8 IMPLANT
SET COLLECT BLD 21X3/4 12 (NEEDLE) IMPLANT
STOPCOCK 4 WAY LG BORE MALE ST (IV SETS) IMPLANT
SUT ETHILON 3 0 PS 1 (SUTURE) IMPLANT
SUT GORETEX 6.0 TT13 (SUTURE) IMPLANT
SUT GORETEX 6.0 TT9 (SUTURE) IMPLANT
SUT MNCRL AB 4-0 PS2 18 (SUTURE) ×8 IMPLANT
SUT PROLENE 5 0 C 1 24 (SUTURE) ×4 IMPLANT
SUT PROLENE 6 0 BV (SUTURE) ×10 IMPLANT
SUT PROLENE 7 0 BV 1 (SUTURE) IMPLANT
SUT SILK 2 0 SH (SUTURE) ×4 IMPLANT
SUT SILK 3 0 (SUTURE) ×4
SUT SILK 3-0 18XBRD TIE 12 (SUTURE) ×1 IMPLANT
SUT VIC AB 2-0 CT1 27 (SUTURE) ×8
SUT VIC AB 2-0 CT1 TAPERPNT 27 (SUTURE) ×4 IMPLANT
SUT VIC AB 3-0 SH 27 (SUTURE) ×12
SUT VIC AB 3-0 SH 27X BRD (SUTURE) ×5 IMPLANT
SYR 5ML LL (SYRINGE) ×3 IMPLANT
TRAY FOLEY W/METER SILVER 16FR (SET/KITS/TRAYS/PACK) ×1 IMPLANT
TUBING EXTENTION W/L.L. (IV SETS) IMPLANT
UNDERPAD 30X30 (UNDERPADS AND DIAPERS) ×4 IMPLANT
WATER STERILE IRR 1000ML POUR (IV SOLUTION) ×4 IMPLANT

## 2016-05-14 NOTE — Transfer of Care (Signed)
Immediate Anesthesia Transfer of Care Note  Patient: [name] [name]  Procedure(s) Performed: Procedure(s): DISTAL REVASCULARIZATION AND INTERVAL LIGATION (DRIL) LEFT ARM (Left)  Patient Location: PACU  Anesthesia Type:General  Level of Consciousness: awake, alert , patient cooperative and responds to stimulation  Airway & Oxygen Therapy: Patient Spontanous Breathing and Patient connected to face mask oxygen  Post-op Assessment: Report given to RN, Post -op Vital signs reviewed and stable and Patient moving all extremities X 4  Post vital signs: Reviewed and stable  Last Vitals:  Vitals:   05/14/16 1351 05/14/16 1354  BP:  (!) (P) 151/51  Pulse:  (P) 74  Resp: (!) 0 (P) 12  Temp:  (P) 36.6 C    Last Pain:  Vitals:   05/14/16 0720  TempSrc: Oral         Complications: No apparent anesthesia complications

## 2016-05-14 NOTE — Op Note (Signed)
Patient name: [name] [name] MRN: [id number] DOB: December 11, 1940 Sex: male  05/14/2016 Pre-operative Diagnosis: left upper extremity steal syndrome, esrd Post-operative diagnosis:  Same Surgeon:  Eda Paschal. Donzetta Matters, MD Assistant: Gerri Lins, PA Procedure Performed: 1.  Left upper extremity distal revascularization with interval ligation of the brachial artery 2.  Harvest of left arm cephalic vein  Indications:  76 year old male with history of end-stage renal disease on dialysis via a left upper arm AV fistula. He has clinical signs of steal as well as angiographic evidence. Adequate vein was identified in the right groin prior to this procedure and at the time of procedure the left arm. He is now indicated for the above procedure.  Findings: the brachial artery is diffusely diseased. Cephalic vein in the forearm was noted to be 0.6 cm in diameter and compressible throughout. At completion there was a biphasic signal at the radial ulnar arteries at the level of the wrist that preoperatively was monophasic.   Procedure:  The patient was identified in the holding area and taken to the operating room where he was placed supine on the operating table general anesthesia was induced he was given antibiotics and sterilely prepped and draped in the right leg and left upper extremity. Timeout was called. We began by making a longitudinal incision below the antecubitum dissecting down to the AV fistula anastomosis and brachial artery just distal to that. A vessel loop was placed around this. We then measured 12 cm more proximal on the arm made a longitudinal incision dissected out of the brachial artery noted this to be also diffusely diseased and placed a vessel loop around this. I then used ultrasound to identify our cephalic vein of the arm which was grossly identifiable. He was noted to be compressible and appeared patent throughout its course. I measured on ultrasound at 0.6 cm. We then extended our  incision down over the cephalic vein dissected out dividing branches between clips and ties. 2 additional counterincisions were made to the level of the wrist to harvest the entire vein it was transected near the wrist tied off with 3-0 silk suture. I then traced back cephalad to the incision at the antecubitum and again clamped it and divided tied off the end with 3-0 silk. A tunneler was passed between the 2 incisions exposing the brachial artery and the patient was given 7000 units of heparin. The vein was then prepared and flushed. The brachial artery proximally was then clamped distally and proximally opened longitudinally. The vein was then sewn end to side with 6-0 Prolene suture in a reversed fashion. Prior to leaving anastomosis we allowed flushing techniques. After releasing the clamps we then had bleeding through our vein bypass. There were no areas of injury to the vein was then tunneled maintaining orientation. The brachial artery at the antecubitum was clamped distally and proximally opened longitudinally the vein trimmed to size and sewn end-to-side with 6-0 Prolene suture. Again we allowed flushing maneuvers. We then used Doppler to identify the signals at the wrist with test clamping the native brachial artery. There were much improved with the brachial artery clamped and so the brachial artery was then tied off with 2-0 silk suture just distal to the AV fistula anastomosis. Signals were then biphasic at the wrist in both the ulnar and radial arteries and there was a strong palmar arch signal. The patient's hand was warm. He was given 25 mg of protamine hemostasis obtained in all the wounds were closed in layers  with 3-0 Vicryl followed by 4-0 Monocryl and Dermabond placed above that. Patient tolerated procedure well without immediate complication. All counts were correct at completion.      Kha Hari C. Donzetta Matters, MD [facility] [place] Office: [phone] Pager:  [phone]

## 2016-05-14 NOTE — Plan of Care (Signed)
Problem: Physical Regulation: Goal: Ability to maintain clinical measurements within normal limits will improve Outcome: Progressing Positive pulses left radial, extremity warm, good color.

## 2016-05-14 NOTE — Anesthesia Preprocedure Evaluation (Addendum)
Anesthesia Evaluation  Patient identified by MRN, date of birth, ID band Patient awake    Reviewed: Allergy & Precautions, NPO status , Patient's Chart, lab work & pertinent test results  Airway Mallampati: II  TM Distance: >3 FB     Dental   Pulmonary shortness of breath, pneumonia,    breath sounds clear to auscultation       Cardiovascular hypertension, + angina with exertion + CAD, + Past MI, + Peripheral Vascular Disease, +CHF and + Orthopnea  + dysrhythmias + Valvular Problems/Murmurs  Rhythm:Regular Rate:Normal     Neuro/Psych Anxiety  Neuromuscular disease CVA    GI/Hepatic Neg liver ROS, hiatal hernia, PUD,   Endo/Other  diabetesHypothyroidism   Renal/GU Renal disease     Musculoskeletal  (+) Arthritis ,   Abdominal   Peds  Hematology  (+) anemia ,   Anesthesia Other Findings   Reproductive/Obstetrics                             Anesthesia Physical Anesthesia Plan  ASA: IV  Anesthesia Plan: General   Post-op Pain Management:    Induction: Intravenous  Airway Management Planned: LMA  Additional Equipment:   Intra-op Plan:   Post-operative Plan: Extubation in OR  Informed Consent: I have reviewed the patients History and Physical, chart, labs and discussed the procedure including the risks, benefits and alternatives for the proposed anesthesia with the patient or authorized representative who has indicated his/her understanding and acceptance.   Dental advisory given  Plan Discussed with: CRNA and Anesthesiologist  Anesthesia Plan Comments:         Anesthesia Quick Evaluation

## 2016-05-14 NOTE — H&P (Signed)
   History and Physical Update  The patient was interviewed and re-examined.  The patient's previous History and Physical has been reviewed and is unchanged from previous. Plan for left arm DRIL with right gsv.   Devoiry Corriher C. Donzetta Matters, MD [facility] [place] Office: [phone] Pager: [phone]   05/14/2016, 7:24 AM

## 2016-05-14 NOTE — Anesthesia Procedure Notes (Signed)
Procedure Name: LMA Insertion Date/Time: 05/14/2016 10:57 AM Performed by: Tressia Miners LEFFEW Pre-anesthesia Checklist: Patient identified, Emergency Drugs available, Suction available and Patient being monitored Patient Re-evaluated:Patient Re-evaluated prior to inductionOxygen Delivery Method: Circle system utilized Preoxygenation: Pre-oxygenation with 100% oxygen Intubation Type: IV induction LMA: LMA inserted LMA Size: 4.0 Tube type: Oral Number of attempts: 1 Placement Confirmation: positive ETCO2 and breath sounds checked- equal and bilateral Tube secured with: Tape Dental Injury: Teeth and Oropharynx as per pre-operative assessment

## 2016-05-14 NOTE — Care Management Note (Signed)
Case Management Note  Patient Details  Name: [name] [name] MRN: [id number] Date of Birth: Dec 21, 1940  Subjective/Objective:    Left upper extremity steal syndrome, ESRD, s/p  Left DRIL            Action/Plan: Discharge Planning: NCM spoke to pt and wife, Zigmund Daniel at bedside. Pt states he was independent at home prior to hospital stay. Still drives to his appts. Will continue to follow for dc needs.   PCP Midge Minium   Expected Discharge Date:                  Expected Discharge Plan:  Home/Self Care  In-House Referral:  NA  Discharge planning Services  CM Consult  Post Acute Care Choice:    Choice offered to:     DME Arranged:    DME Agency:     HH Arranged:    HH Agency:     Status of Service:  In process, will continue to follow  If discussed at Long Length of Stay Meetings, dates discussed:    Additional Comments:  Erenest Rasher, RN 05/14/2016, 4:21 PM

## 2016-05-15 ENCOUNTER — Telehealth: Payer: Self-pay | Admitting: Vascular Surgery

## 2016-05-15 ENCOUNTER — Encounter (HOSPITAL_COMMUNITY): Payer: Self-pay | Admitting: Vascular Surgery

## 2016-05-15 MED ORDER — OXYCODONE-ACETAMINOPHEN 5-325 MG PO TABS: 1.0000 | ORAL_TABLET | Freq: Four times a day (QID) | ORAL | 0 refills | Status: DC | PRN

## 2016-05-15 MED ORDER — ACETAMINOPHEN 325 MG PO TABS
ORAL_TABLET | ORAL | Status: AC
  Administered 2016-05-15: 650 mg via ORAL
  Filled 2016-05-15: qty 2

## 2016-05-15 MED ORDER — OXYCODONE-ACETAMINOPHEN 5-325 MG PO TABS
ORAL_TABLET | ORAL | Status: AC
  Administered 2016-05-15: 2 via ORAL
  Filled 2016-05-15: qty 2

## 2016-05-15 NOTE — Progress Notes (Signed)
Patient arrived to unit per bed.  Reviewed treatment plan and this RN agrees.  Report received from bedside RN, Tiffany.  Consent obtained.  Patient A & O X 4. Lung sounds clear to ausculation in all fields. Generalized non pitting LUE edema. Cardiac: NSR.  Prepped LUAVF with alcohol and cannulated with two 15 gauge needles.  Pulsation of blood noted.  Flushed access well with saline per protocol.  Connected and secured lines and initiated tx at 0941.  UF goal of 3000 mL and net fluid removal of 2500 mL.  Will continue to monitor.

## 2016-05-15 NOTE — Telephone Encounter (Signed)
spoke to spouse agreed to appt date and time, mailed letter for 06/01/16 appt

## 2016-05-15 NOTE — Progress Notes (Addendum)
  Postoperative hemodialysis access     Date of Surgery:  05/14/16 Surgeon: Donzetta Matters  Subjective:  "My hand feels much better"  PHYSICAL EXAMINATION:  Vitals:   05/14/16 2104 05/15/16 0417  BP: (!) 126/43 (!) 108/40  Pulse: 66 (!) 57  Resp: 18 18  Temp: 97.8 F (36.6 C) 97.6 F (36.4 C)    Incisions are clean and dry  Sensation in digits is intact;  There is  Thrill  There is bruit. The graft/fistula is palpable    ASSESSMENT/PLAN:  [name] [name] is a 76 y.o. year old male who is s/p  1.  Left upper extremity distal revascularization with interval ligation of the brachial artery 2.  Harvest of left arm cephalic vein.  -graft/fistula is patent -pt does not have evidence of steal sx-his steal sx are much improved.  Left hand is warm and sensation is in tact. -f/u with Dr. Donzetta Matters in a couple of weeks to check incisions -home after dialysis-spoke with HD unit and he is on the schedule for this morning.   Leontine Locket, PA-C [facility] [phone]  I have independently interviewed patient and agree with PA assessment and plan above. Left hand is much improved and fistula has a strong thrill. D/c following hd.   Lakely Elmendorf C. Donzetta Matters, MD [facility] [place] Office: [phone] Pager: [phone]

## 2016-05-15 NOTE — Discharge Summary (Signed)
Discharge Summary    [name] [name] 05-Mar-1940 76 y.o. male  [id number]  Admission Date: 05/14/2016  Discharge Date: 05/15/16  Physician: Thomes Lolling*  Admission Diagnosis: End Stage Renal Disease N18.6 Left Upper Extremity Arteriovenous Fistula Steal Syndrome T82.510 A   HPI:   This is a 76 y.o. male follows up from pain in his left hand that is constant and nonradiating. It is exacerbated with dialysis very little alleviating factors. He states it is taking his quality of life in all aspects and that his hand feels weaker than his right and is always numb. He has not had any constitutional symptoms. He does take Plavix and Pletal. At last visit he refused any further intervention but now wants to discuss proceeding with revascularization of his left hand.  Hospital Course:  The patient was admitted to the hospital and taken to the operating room on 05/14/2016 and underwent: 1.  Left upper extremity distal revascularization with interval ligation of the brachial artery 2.  Harvest of left arm cephalic vein    Intraoperative findings:  the brachial artery is diffusely diseased. Cephalic vein in the forearm was noted to be 0.6 cm in diameter and compressible throughout. At completion there was a biphasic signal at the radial ulnar arteries at the level of the wrist that preoperatively was monophasic.  The pt tolerated the procedure well and was transported to the PACU in good condition.   By POD 1, the pt states his pain was much improved.  He states his sensation is improved.  He is scheduled for dialysis prior to discharge.   The remainder of the hospital course consisted of increasing mobilization and increasing intake of solids without difficulty.  CBC    Component Value Date/Time   WBC 11.3 (H) 05/14/2016 1635   RBC 3.36 (L) 05/14/2016 1635   HGB 10.2 (L) 05/14/2016 1635   HCT 31.9 (L) 05/14/2016 1635   PLT 226 05/14/2016 1635   MCV 94.9 05/14/2016 1635   MCH 30.4 05/14/2016 1635   MCHC 32.0 05/14/2016 1635   RDW 14.4 05/14/2016 1635   LYMPHSABS 819 (L) 02/02/2016 1641   MONOABS 910 02/02/2016 1641   EOSABS 364 02/02/2016 1641   BASOSABS 91 02/02/2016 1641    BMET    Component Value Date/Time   NA 139 05/14/2016 1635   K 4.3 05/14/2016 1635   CL 105 05/14/2016 1635   CO2 25 05/14/2016 1635   GLUCOSE 153 (H) 05/14/2016 1635   BUN 46 (H) 05/14/2016 1635   CREATININE 5.62 (H) 05/14/2016 1635   CREATININE 4.81 (H) 08/02/2014 1421   CALCIUM 9.1 05/14/2016 1635   GFRNONAA 9 (L) 05/14/2016 1635   GFRAA 10 (L) 05/14/2016 1635      Discharge Instructions    Call MD for:  redness, tenderness, or signs of infection (pain, swelling, bleeding, redness, odor or green/yellow discharge around incision site)    Complete by:  As directed    Call MD for:  severe or increased pain, loss or decreased feeling  in affected limb(s)    Complete by:  As directed    Call MD for:  temperature >100.5    Complete by:  As directed    Discharge wound care:    Complete by:  As directed    Shower daily with soap and water starting 05/16/16   Driving Restrictions    Complete by:  As directed    Do not drive for 24 hours and while taking pain medication.  Lifting restrictions    Complete by:  As directed    No lifting for 3 weeks   Resume previous diet    Complete by:  As directed       Discharge Diagnosis:  End Stage Renal Disease N18.6 Left Upper Extremity Arteriovenous Fistula Steal Syndrome T82.510 A  Secondary Diagnosis: Patient Active Problem List   Diagnosis Date Noted  . ESRD on dialysis ([place]) 05/14/2016  . Anxiety state 02/05/2016  . Chronic stable angina ([place]) 10/02/2015  . Left shoulder pain 03/09/2015  . Hypotension of hemodialysis 01/28/2015  . Coronary stent restenosis due to scar tissue 01/28/2015  . Watery diarrhea 07/01/2014  . Pain in the chest   . Plavix resistance   . LBBB (left bundle branch block) 04/19/2014  .  Carotid arterial disease ([place])   . Coronary artery disease involving native coronary artery of native heart with angina pectoris ([place])   . Presence of drug coated stent in right coronary artery 04/30/2013  . Cough 04/13/2013  . OM (otitis media) 01/21/2013  . History of Non-ST elevated myocardial infarction (non-STEMI)   . Orthopnea 09/29/2012  . CAD (coronary artery disease) of bypass graft - PCI to SVG-OM with BMS; PCI-mid RCA with a Xience DES 09/29/2012  . Routine general medical examination at a health care facility 06/20/2012  . Decreased radial pulse 03/17/2012  . Medication management 12/17/2011  . Breast lump 07/25/2011  . Neoplasm of uncertain behavior of skin 06/18/2011  . 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 CAD S/P percutaneous coronary angioplasty 10/09/2010  . DIARRHEA, CHRONIC 08/17/2009  . HIATAL HERNIA 08/03/2009  . Personal history of other diseases of digestive system 08/03/2009  . RASH-NONVESICULAR 04/14/2009  . ANEMIA, IRON DEFICIENCY 02/18/2009  . RINGWORM 02/15/2009  . Chronic combined systolic and diastolic congestive heart failure, NYHA class 2 ([place]) 02/15/2009  . Hypothyroidism 12/08/2007  . Diabetes mellitus with neuropathy ([place]) 12/08/2007  . Hyperlipidemia with target LDL less than 70 12/08/2007  . Gout 12/08/2007  . Essential hypertension 12/08/2007  . RENAL INSUFFICIENCY, CHRONIC 12/08/2007  . LOW BACK PAIN 12/08/2007  . COLON CANCER, HX OF 12/08/2007  . CEREBROVASCULAR ACCIDENT, HX OF 12/08/2007   Past Medical History:  Diagnosis Date  . Anemia    Likely secondary to history of GI bleed  . Anginal pain ([facility])    Chronic stable  . Anxiety   . Arthritis   . BPH (benign prostatic hyperplasia)   . CAD (coronary artery disease) of bypass graft 2006, 2012   In 2006: Occluded SVG-OM noted (SVG-diagonal was previously occluded); 2012: Severe lesion in redo SVG-OM1 --> BMS PCI   . CAD in native artery; and the grafts 1992, 1997,  2002, 2006, 2012   CABG 1992 and redo CABG 2006  . CAD S/P percutaneous coronary angioplasty October 2012; Feb, Apr & Oct 2015; Feb 2016   a) s/p CABG 1992 and redo 2006. b) 10/12: NSTEMI: PCI to SVG-OM1 Integrity BMS 3.5 x 15 (3.85 mm) c) 2/15: UA - PCI mid RCA Xience Ap DES 2.75 x 15 (3.1 mm). d) 4/15 : UA - focal dRCA Resolute DES 2.25 x 8 (2.5 mm). e) 10/15: PCI to mRCA ISR w/ post stent lesion - Promus P 2.75 x 16 (3.25 mm). f) 2/16: NSTEMI: CBA/PTCA of  mRCA ISR (3.5 mm post-dilation); g) 7/16 ISR RCA->CBA/PTCA, residual 20%.  . Carotid arterial disease ([place])    a) Duplex 05/2012: R BULB/PROX ICA: 50-69%, L BULB/PROX ICA: 0-49%. ;;  04/29/2014: 34-19% RICA, 37-90% LICA. Patetn vertebrals,  . Chronic low back pain   . Colon cancer [facility]) 2003   colectomy for CA. no recurrence . never required  radiation or chem  . Diabetes mellitus    type -II  . Dyspnea    "when I get too much Fluid"  . End stage renal disease on dialysis [facility])    Dialyzes at [facility]: Tues/Th/Sat - left upper cavity AV fistula brachiocephalic  . Gout   . Heart murmur   . History of hiatal hernia   . History of Non-ST elevated myocardial infarction (non-STEMI) 1992, 2006, 2012; 02/2013  . Hyperlipidemia   . Hypertension   . Hypothyroidism    On supplementation  . Ischemic cardiomyopathy    a) EF 45-50% by echo 2015. b) EF 50% by cath 04/2014.  Marland Kitchen LBBB (left bundle branch block)    Chronic  . Peptic ulcer disease  2004  . Plavix resistance    a) P2Y12 264 in 02/2014 - put on Brilinta but did not tolerate due to SOB. b) cannot be on Effient due to history of stroke. c) Plavix increased to 75mg  BID and Pletal added 04/2014 due to ISR.  Marland Kitchen Pneumonia   . Skin cancer 02/1999   nose  . Stroke [facility]) 1998     Allergies as of 05/15/2016      Reactions   Brilinta [ticagrelor] Shortness Of Breath   Shellfish Allergy Other (See Comments)   Causes gout flare-ups      Medication List    TAKE these medications     acetaminophen 500 MG tablet Commonly known as:  TYLENOL Take 1,000 mg by mouth 2 (two) times daily as needed for mild pain.   amLODipine 5 MG tablet Commonly known as:  NORVASC Take 0.5 tablet on non dialysis day  By mouth. What changed:  additional instructions   atorvastatin 20 MG tablet Commonly known as:  LIPITOR Take 0.5 tablets (10 mg total) by mouth daily. What changed:  when to take this   BESIVANCE 0.6 % Susp Generic drug:  Besifloxacin HCl Place 1 drop into both eyes See admin instructions. Use eye drops 2 times daily for 2 days following injection by Dr. Zigmund Daniel (every 10 weeks)   cilostazol 100 MG tablet Commonly known as:  PLETAL TAKE ONE TABLET BY MOUTH TWICE DAILY   clonazePAM 0.5 MG tablet Commonly known as:  KLONOPIN TAKE ONE TABLET BY MOUTH TWICE DAILY AS NEEDED FOR ANXIETY   clopidogrel 75 MG tablet Commonly known as:  PLAVIX Take 1 tablet (75 mg total) by mouth daily.   ferric citrate 1 GM 210 MG(Fe) tablet Commonly known as:  AURYXIA Take 420 mg by mouth 3 (three) times daily with meals.   glucose blood test strip Commonly known as:  TRUE METRIX BLOOD GLUCOSE TEST 1 each by Other route as needed for other. Use as instructed to test sugars 2-3 times daily. Dx. E11.40   isosorbide mononitrate 120 MG 24 hr tablet Commonly known as:  IMDUR Take 1 tablet (120 mg total) by mouth daily.   levothyroxine 125 MCG tablet Commonly known as:  SYNTHROID, LEVOTHROID TAKE ONE TABLET BY MOUTH ONCE DAILY BEFORE BREAKFAST What changed:  how much to take  how to take this  when to take this  additional instructions   lidocaine-prilocaine cream Commonly known as:  EMLA Apply 1 application topically See admin instructions. Apply prior to dialysis treatments on Tuesday, Thursday and Saturday   loperamide  2 MG tablet Commonly known as:  IMODIUM A-D Take 4 mg by mouth 2 (two) times daily as needed for diarrhea or loose stools.   MELATONIN PO Take 1  tablet by mouth at bedtime as needed (sleep).   metoprolol succinate 50 MG 24 hr tablet Commonly known as:  TOPROL-XL Take 1 tablet (50 mg total) by mouth at bedtime. Take with or immediately following a meal. What changed:  how much to take  additional instructions   NITROSTAT 0.4 MG SL tablet Generic drug:  nitroGLYCERIN PLACE 1 TABLET UNDER THE TONGUE EVERY 5 MINUTES AS NEEDED FOR CHEST PAIN (MAX 3 DOSES PER DAY)   oxyCODONE-acetaminophen 5-325 MG tablet Commonly known as:  ROXICET Take 1 tablet by mouth every 6 (six) hours as needed for severe pain.   pantoprazole 40 MG tablet Commonly known as:  PROTONIX Take 1 tablet (40 mg total) by mouth daily.       Prescriptions given: Roxicet #12 No Refill  Instructions: 1.  No driving for 24 hours and while taking pain medication 2.  No heavy lifting x 3 weeks 3.  Shower daily starting 05/16/16  Disposition: home  Patient's condition: is Good  Follow up: 1. Dr. Donzetta Matters in 2-3 weeks   Leontine Locket, PA-C [facility] [phone] 05/15/2016  7:35 AM

## 2016-05-15 NOTE — Consult Note (Signed)
Reason for Consult: To manage dialysis and dialysis related needs Referring Physician: Dr. [name] is an 76 y.o. male.  HPI: Pt is a [name] with ESRD on HD TTS, HTN, HLD, CAD s/p CABG, and a h/o skin cancer who is now seen in consultation at the request of Dr. Donzetta Matters for management of ESRD and provision of HD.    Pt has had LUE steal syndrome related to his dialysis access and underwent a DRIL procedure yesterday.  He reports expected postoperative soreness but is otherwise without complaint.  He notes that his hand is much more well perfused than prior.  Dialyzes at [facility] EDW 64.5 kg. HD 4 hours Bath 3K 2.25 Ca Dialyzer F180, Access LUE AVF, heparin 3000 u bolus Mircera 30 mcg q 4 weeks Hectorol 2 mcg q treatment  Past Medical History:  Diagnosis Date  . Anemia    Likely secondary to history of GI bleed  . Anginal pain (HCC)    Chronic stable  . Anxiety   . Arthritis   . BPH (benign prostatic hyperplasia)   . CAD (coronary artery disease) of bypass graft 2006, 2012   In 2006: Occluded SVG-OM noted (SVG-diagonal was previously occluded); 2012: Severe lesion in redo SVG-OM1 --> BMS PCI   . CAD in native artery; and the grafts 1992, 1997, 2002, 2006, 2012   CABG 1992 and redo CABG 2006  . CAD S/P percutaneous coronary angioplasty October 2012; Feb, Apr & Oct 2015; Feb 2016   a) s/p CABG 1992 and redo 2006. b) 10/12: NSTEMI: PCI to SVG-OM1 Integrity BMS 3.5 x 15 (3.85 mm) c) 2/15: UA - PCI mid RCA Xience Ap DES 2.75 x 15 (3.1 mm). d) 4/15 : UA - focal dRCA Resolute DES 2.25 x 8 (2.5 mm). e) 10/15: PCI to mRCA ISR w/ post stent lesion - Promus P 2.75 x 16 (3.25 mm). f) 2/16: NSTEMI: CBA/PTCA of  mRCA ISR (3.5 mm post-dilation); g) 7/16 ISR RCA->CBA/PTCA, residual 20%.  . Carotid arterial disease ([place])    a) Duplex 05/2012: R BULB/PROX ICA: 50-69%, L BULB/PROX ICA: 0-49%. ;; 04/29/2014: 09-81% RICA, 19-14% LICA. Patetn vertebrals,  . Chronic low back pain   . Colon cancer [facility])  2003   colectomy for CA. no recurrence . never required  radiation or chem  . Diabetes mellitus    type -II  . Dyspnea    "when I get too much Fluid"  . End stage renal disease on dialysis [facility])    Dialyzes at [facility]: Tues/Th/Sat - left upper cavity AV fistula brachiocephalic  . Gout   . Heart murmur   . History of hiatal hernia   . History of Non-ST elevated myocardial infarction (non-STEMI) 1992, 2006, 2012; 02/2013  . Hyperlipidemia   . Hypertension   . Hypothyroidism    On supplementation  . Ischemic cardiomyopathy    a) EF 45-50% by echo 2015. b) EF 50% by cath 04/2014.  Marland Kitchen LBBB (left bundle branch block)    Chronic  . Peptic ulcer disease  2004  . Plavix resistance    a) P2Y12 264 in 02/2014 - put on Brilinta but did not tolerate due to SOB. b) cannot be on Effient due to history of stroke. c) Plavix increased to 37m BID and Pletal added 04/2014 due to ISR.  .Marland KitchenPneumonia   . Skin cancer 02/1999   nose  . Stroke ([facility] 1998    Past Surgical History:  Procedure Laterality Date  .  AORTIC ARCH ANGIOGRAPHY N/A 05/09/2016   Procedure: Aortic Arch Angiography;  Surgeon: Waynetta Sandy, MD;  Location: [place] [facility];  Service: Cardiovascular;  Laterality: N/A;  . APPENDECTOMY    . AV FISTULA REPAIR Left 05/2009  . CARDIAC CATHETERIZATION  10/16/2010   patent  LIMA to the LAD , PATENT  svg TO 2nd marginals ,occluded PLA with collaterals and SVG to the OM  had 90% stenosis  was txlge  bare - metal  3.5  Integrity ten  postdilate  36-37  mm    . CARDIAC CATHETERIZATION  03/22/2004   loss of both SVG,severe disease in prox and ostial  circ not amenable to invention. mod diease distal LAD  AFTER BYPASS GRAFT;-CVTS to evaluate   . CARDIAC CATHETERIZATION N/A 08/03/2014   Procedure: Left Heart Cath and Cors/Grafts Angiography;  Surgeon: Leonie Man, MD;  Location: [place] [facility];  Service: Cardiovascular;  Laterality: N/A;  . CARDIAC CATHETERIZATION N/A 08/03/2014    Procedure: Coronary Balloon Angioplasty;  Surgeon: Leonie Man, MD;  Location: [place] [facility];  Service: Cardiovascular;: Aggressive Cutting Balloon - [place]ouris Balloon PTCA of ISR site in mRCA (3 stent layer)  . CAROTID DOPPLER  05/23/2012   ABN CAROTID-- RGT BULB/PROX ICA mild to mod 50-60%;lft bulb/prox mild to mod 0-49%;left subclavian abn waveforms consistent with patients lft arm A/V fistula  . CATARACT EXTRACTION W/ INTRAOCULAR LENS  IMPLANT, BILATERAL Bilateral   . CHOLECYSTECTOMY  2009  . COLON RESECTION  2003   transverse and proximal descending w/ primar anastomosis  . COLON SURGERY  2003   "cancer"  . COLONOSCOPY    . CORONARY ANGIOPLASTY  04/03/1995   OM and Circ  . CORONARY ANGIOPLASTY  02/01/2000   CIRC  . CORONARY ANGIOPLASTY  02/09/14   Cutting Balloon PTCA of mRCA ISR 99% - 3.5 mm  . CORONARY ARTERY BYPASS GRAFT  08/22/1990   INITIAL:LIMA to LAD, SVG to  Diagonal, SVG to OM  . CORONARY ARTERY BYPASS GRAFT  2006   SVG to OM1,SVG to OM2 with patent LIMA  to LAD and occluded vein  graft to the diagonal  and occluded vein grft to OM from  prior  surgery  . DOPPLER ECHOCARDIOGRAPHY  01/25/2012   EF 50 to 55%  . EVENT MONITOR  01/23/2012-02/06/2012   SINUS ,LBBB,unifocal PVCs  . LEFT AND RIGHT HEART CATHETERIZATION WITH CORONARY/GRAFT ANGIOGRAM N/A 04/20/2014   Procedure: LEFT AND RIGHT HEART CATHETERIZATION WITH Beatrix Fetters;  Surgeon: Sherren Mocha, MD;  Location: [facility] CATH LAB;  Service: Cardiovascular;  Laterality: N/A;  . LEFT HEART CATHETERIZATION WITH CORONARY ANGIOGRAM N/A 02/23/2013   Procedure: LEFT HEART CATHETERIZATION WITH CORONARY ANGIOGRAM;  Surgeon: Lorretta Harp, MD;  Location: [facility] CATH LAB;  Service: Cardiovascular;  Laterality: N/A;  . LEFT HEART CATHETERIZATION WITH CORONARY/GRAFT ANGIOGRAM N/A 04/30/2013   Procedure: LEFT HEART CATHETERIZATION WITH Beatrix Fetters;  Surgeon: Leonie Man, MD;  Location: [facility] CATH LAB;  Service:  Cardiovascular;  Laterality: N/A;  . LEFT HEART CATHETERIZATION WITH CORONARY/GRAFT ANGIOGRAM N/A 10/15/2013   Procedure: LEFT HEART CATHETERIZATION WITH Beatrix Fetters;  Surgeon: Leonie Man, MD;  Location: [facility] CATH LAB;  Service: Cardiovascular;  Laterality: N/A;  . LEFT HEART CATHETERIZATION WITH CORONARY/GRAFT ANGIOGRAM N/A 02/09/2014   Procedure: LEFT HEART CATHETERIZATION WITH Beatrix Fetters;  Surgeon: Leonie Man, MD;  Location: [facility] CATH LAB;  Service: Cardiovascular;  Laterality: N/A;  . Lower Extremity Arterial Dopplers  October 2014  Calcified but non-occlusive peripheral arteries. No evidence of stenosis  . NM MYOCAR PERF WALL MOTION  10/12/2011   EF 54%,LV normal ; no signifiant ischemia  . PERCUTANEOUS CORONARY STENT INTERVENTION (PCI-S)  02/23/2013   mid RCA 80% & 70% - Xience 2.75 mm x 15 mm ( 3.84m) ; LIMA-LAD patent, SVG-OM patent (stent patent); SVG-Diag patent.  .Marland KitchenPERCUTANEOUS CORONARY STENT INTERVENTION (PCI-S)  April 2015   dRCA - Integrity Resolute DES   2.25 mm x 825m(2.5 mm)  . PERCUTANEOUS CORONARY STENT INTERVENTION (PCI-S)  Oct 15 2013   [place] Angina: mRCA ISR with post-stent stenosis -- Cuting PTCA & PCI Promus Premier DES 2.75 mm x 16 mm (3.25 mm)  . POSTERIOR LUMBAR FUSION    . REVISON OF ARTERIOVENOUS FISTULA Left 1040/97/3532 Procedure: PLICATION OF LEFT UPPER ARM  ARTERIOVENOUS FISTULA  ANEURYSM;  Surgeon: ToRosetta PosnerMD;  [place];  Laterality: Left;  . TRANSTHORACIC ECHOCARDIOGRAM  02/23/2013   Moderate concentric hypertrophy. EF 4500%. Septal bounce. Grade 2 diastolic dysfunction (pseudo-normal - severely elevated filling pressures) mild to moderate MR and moderate LA dilation to  . UPPER EXTREMITY ANGIOGRAPHY Left 05/09/2016   Procedure: Upper Extremity Angiography;  Surgeon: BrWaynetta SandyMD;  [facility];  Service: Cardiovascular;  Laterality: Left;    Family History   Problem Relation Age of Onset  . Heart disease Mother   . Hypertension Mother   . Diabetes Mother   . Heart attack Mother   . Heart disease Father   . Hypertension Father   . Diabetes Father   . Heart attack Father   . Stroke Paternal Aunt   . Stroke Paternal Uncle   . Colon cancer Neg Hx   . Esophageal cancer Neg Hx     Social History:  reports that he has never smoked. He has never used smokeless tobacco. He reports that he does not drink alcohol or use drugs.  Allergies:  Allergies  Allergen Reactions  . Brilinta [Ticagrelor] Shortness Of Breath  . Shellfish Allergy Other (See Comments)    Causes gout flare-ups    Medications: I have reviewed the patient's current medications.   Results for orders placed or performed during the hospital encounter of 05/14/16 (from the past 48 hour(s))  I-STAT 4, (NA,K, GLUC, HGB,HCT)     Status: Abnormal   Collection Time: 05/14/16  7:43 AM  Result Value Ref Range   Sodium 140 135 - 145 mmol/L   Potassium 4.2 3.5 - 5.1 mmol/L   Glucose, Bld 131 (H) 65 - 99 mg/dL   HCT 31.0 (L) 39.0 - 52.0 %   Hemoglobin 10.5 (L) 13.0 - 17.0 g/dL  Glucose, capillary     Status: Abnormal   Collection Time: 05/14/16  1:58 PM  Result Value Ref Range   Glucose-Capillary 119 (H) 65 - 99 mg/dL   Comment 1 Notify RN   CBC     Status: Abnormal   Collection Time: 05/14/16  4:35 PM  Result Value Ref Range   WBC 11.3 (H) 4.0 - 10.5 K/uL   RBC 3.36 (L) 4.22 - 5.81 MIL/uL   Hemoglobin 10.2 (L) 13.0 - 17.0 g/dL   HCT 31.9 (L) 39.0 - 52.0 %   MCV 94.9 78.0 - 100.0 fL   MCH 30.4 26.0 - 34.0 pg   MCHC 32.0 30.0 - 36.0 g/dL   RDW 14.4 11.5 - 15.5 %   Platelets 226 150 -  400 K/uL  Basic metabolic panel     Status: Abnormal   Collection Time: 05/14/16  4:35 PM  Result Value Ref Range   Sodium 139 135 - 145 mmol/L   Potassium 4.3 3.5 - 5.1 mmol/L   Chloride 105 101 - 111 mmol/L   CO2 25 22 - 32 mmol/L   Glucose, Bld 153 (H) 65 - 99 mg/dL   BUN 46 (H) 6 -  20 mg/dL   Creatinine, Ser 5.62 (H) 0.61 - 1.24 mg/dL   Calcium 9.1 8.9 - 10.3 mg/dL   GFR calc non Af Amer 9 (L) >60 mL/min   GFR calc Af Amer 10 (L) >60 mL/min    Comment: (NOTE) The eGFR has been calculated using the CKD EPI equation. This calculation has not been validated in all clinical situations. eGFR's persistently <60 mL/min signify possible Chronic Kidney Disease.    Anion gap 9 5 - 15    No results found.  ROS: all other systems reviewed and are negative except as per HPI Blood pressure (!) 108/40, pulse (!) 57, temperature 97.6 F (36.4 C), temperature source Oral, resp. rate 18, height 5' 5" (1.651 m), weight 64 kg (141 lb 1.5 oz), SpO2 99 %. Marland Kitchen GEN well-appearing, NAD HEENT EOMI, PERRL, MMM NECK no JVD PULM clear bilaterally CV RRR no m/r/g ABD nondistended nontender NABS EXT no LE edema, LUE AVF with good thrill and bruit.  Surgical incisions well-approximated.  L hand warm and well perfused, nail beds no longer blue NEURO AAO x 3  Assessment/Plan: 1 LUE steal syndrome: s/p DRIL procedure.  Hand appears much improved.  Will provide HD today  2 ESRD: TTS, provide regular schedule.  3K bath 3 Hypertension: controlled 4. Anemia of ESRD: on mircera, no venofer, Hgb 11.3 as an outpatient 5. Metabolic Bone Disease: Continue hectorol, no sensipar 6.  Dispo: home after dialysis  Madelon Lips 05/15/2016, 8:33 AM

## 2016-05-15 NOTE — Procedures (Signed)
Patient seen and examined on Hemodialysis. QB 350, UF goal 2.5L.  L hand warm and well-perfused with good cap refill.    Pt reports no numbness or tingling of hand.  Treatment adjusted as needed.  Madelon Lips MD [facility] 1:21 PM

## 2016-05-15 NOTE — Telephone Encounter (Signed)
-----   Message from Mena Goes, RN sent at 05/15/2016  8:50 AM EDT ----- Regarding: 2-3 weeks    ----- Message ----- From: Natividad Brood Sent: 05/15/2016   7:34 AM To: Vvs Charge Pool  s/p  1.  Left upper extremity distal revascularization with interval ligation of the brachial artery 2.  Harvest of left arm cephalic vein  f/u with Dr. Donzetta Matters in 2-3 weeks.  Thanks, Aldona Bar

## 2016-05-15 NOTE — Progress Notes (Signed)
Dialysis treatment completed.  3000 mL ultrafiltrated and net fluid removal 2500 mL.    Patient status unchanged. Lung sounds diminished to ausculation in all fields. No edema. Cardiac: NSR.  Disconnected lines and removed needles.  Pressure held for 10 minutes and band aid/gauze dressing applied.  Report given to bedside RN, Tiffany.

## 2016-05-15 NOTE — Progress Notes (Signed)
Pt discharged per plan after dialysis. Iv removed, tele d.c, ccmd notified. AVS went over with patient including medications and follow up appointments. Script given to patient. Patient to home with spouse via car.  Cyndia Bent RN

## 2016-05-17 DIAGNOSIS — E1129 Type 2 diabetes mellitus with other diabetic kidney complication: Secondary | ICD-10-CM | POA: Diagnosis not present

## 2016-05-17 DIAGNOSIS — D631 Anemia in chronic kidney disease: Secondary | ICD-10-CM | POA: Diagnosis not present

## 2016-05-17 DIAGNOSIS — N186 End stage renal disease: Secondary | ICD-10-CM | POA: Diagnosis not present

## 2016-05-17 DIAGNOSIS — N2581 Secondary hyperparathyroidism of renal origin: Secondary | ICD-10-CM | POA: Diagnosis not present

## 2016-05-17 DIAGNOSIS — E876 Hypokalemia: Secondary | ICD-10-CM | POA: Diagnosis not present

## 2016-05-18 ENCOUNTER — Emergency Department (HOSPITAL_COMMUNITY): Payer: Medicare Other

## 2016-05-18 ENCOUNTER — Encounter (HOSPITAL_COMMUNITY): Payer: Self-pay | Admitting: Emergency Medicine

## 2016-05-18 ENCOUNTER — Inpatient Hospital Stay (HOSPITAL_COMMUNITY)
Admission: EM | Admit: 2016-05-18 | Discharge: 2016-05-22 | DRG: 246 | Disposition: A | Discharge status: Home / Self Care | Payer: Medicare Other | Attending: Internal Medicine | Admitting: Internal Medicine

## 2016-05-18 DIAGNOSIS — Z951 Presence of aortocoronary bypass graft: Secondary | ICD-10-CM

## 2016-05-18 DIAGNOSIS — Z9842 Cataract extraction status, left eye: Secondary | ICD-10-CM

## 2016-05-18 DIAGNOSIS — Z9841 Cataract extraction status, right eye: Secondary | ICD-10-CM

## 2016-05-18 DIAGNOSIS — Y831 Surgical operation with implant of artificial internal device as the cause of abnormal reaction of the patient, or of later complication, without mention of misadventure at the time of the procedure: Secondary | ICD-10-CM | POA: Diagnosis present

## 2016-05-18 DIAGNOSIS — Z79899 Other long term (current) drug therapy: Secondary | ICD-10-CM

## 2016-05-18 DIAGNOSIS — I447 Left bundle-branch block, unspecified: Secondary | ICD-10-CM | POA: Diagnosis present

## 2016-05-18 DIAGNOSIS — R0602 Shortness of breath: Secondary | ICD-10-CM | POA: Diagnosis not present

## 2016-05-18 DIAGNOSIS — M109 Gout, unspecified: Secondary | ICD-10-CM | POA: Diagnosis present

## 2016-05-18 DIAGNOSIS — R079 Chest pain, unspecified: Secondary | ICD-10-CM | POA: Diagnosis not present

## 2016-05-18 DIAGNOSIS — I10 Essential (primary) hypertension: Secondary | ICD-10-CM | POA: Diagnosis present

## 2016-05-18 DIAGNOSIS — I25810 Atherosclerosis of coronary artery bypass graft(s) without angina pectoris: Secondary | ICD-10-CM | POA: Diagnosis present

## 2016-05-18 DIAGNOSIS — I25110 Atherosclerotic heart disease of native coronary artery with unstable angina pectoris: Secondary | ICD-10-CM | POA: Diagnosis present

## 2016-05-18 DIAGNOSIS — I214 Non-ST elevation (NSTEMI) myocardial infarction: Secondary | ICD-10-CM | POA: Diagnosis present

## 2016-05-18 DIAGNOSIS — T82855A Stenosis of coronary artery stent, initial encounter: Secondary | ICD-10-CM | POA: Diagnosis not present

## 2016-05-18 DIAGNOSIS — Z9861 Coronary angioplasty status: Secondary | ICD-10-CM

## 2016-05-18 DIAGNOSIS — Z85038 Personal history of other malignant neoplasm of large intestine: Secondary | ICD-10-CM

## 2016-05-18 DIAGNOSIS — N2581 Secondary hyperparathyroidism of renal origin: Secondary | ICD-10-CM | POA: Diagnosis present

## 2016-05-18 DIAGNOSIS — I2583 Coronary atherosclerosis due to lipid rich plaque: Secondary | ICD-10-CM | POA: Diagnosis present

## 2016-05-18 DIAGNOSIS — N186 End stage renal disease: Secondary | ICD-10-CM | POA: Diagnosis present

## 2016-05-18 DIAGNOSIS — I208 Other forms of angina pectoris: Secondary | ICD-10-CM | POA: Diagnosis present

## 2016-05-18 DIAGNOSIS — E1122 Type 2 diabetes mellitus with diabetic chronic kidney disease: Secondary | ICD-10-CM | POA: Diagnosis present

## 2016-05-18 DIAGNOSIS — Z7902 Long term (current) use of antithrombotics/antiplatelets: Secondary | ICD-10-CM

## 2016-05-18 DIAGNOSIS — I255 Ischemic cardiomyopathy: Secondary | ICD-10-CM | POA: Diagnosis present

## 2016-05-18 DIAGNOSIS — D509 Iron deficiency anemia, unspecified: Secondary | ICD-10-CM | POA: Diagnosis present

## 2016-05-18 DIAGNOSIS — I200 Unstable angina: Secondary | ICD-10-CM | POA: Diagnosis not present

## 2016-05-18 DIAGNOSIS — Z8711 Personal history of peptic ulcer disease: Secondary | ICD-10-CM

## 2016-05-18 DIAGNOSIS — F419 Anxiety disorder, unspecified: Secondary | ICD-10-CM | POA: Diagnosis present

## 2016-05-18 DIAGNOSIS — I252 Old myocardial infarction: Secondary | ICD-10-CM

## 2016-05-18 DIAGNOSIS — I120 Hypertensive chronic kidney disease with stage 5 chronic kidney disease or end stage renal disease: Secondary | ICD-10-CM | POA: Diagnosis present

## 2016-05-18 DIAGNOSIS — Z992 Dependence on renal dialysis: Secondary | ICD-10-CM

## 2016-05-18 DIAGNOSIS — Z85828 Personal history of other malignant neoplasm of skin: Secondary | ICD-10-CM

## 2016-05-18 DIAGNOSIS — E039 Hypothyroidism, unspecified: Secondary | ICD-10-CM | POA: Diagnosis present

## 2016-05-18 DIAGNOSIS — M1990 Unspecified osteoarthritis, unspecified site: Secondary | ICD-10-CM | POA: Diagnosis present

## 2016-05-18 DIAGNOSIS — E785 Hyperlipidemia, unspecified: Secondary | ICD-10-CM | POA: Diagnosis present

## 2016-05-18 DIAGNOSIS — Z7982 Long term (current) use of aspirin: Secondary | ICD-10-CM

## 2016-05-18 DIAGNOSIS — D631 Anemia in chronic kidney disease: Secondary | ICD-10-CM | POA: Diagnosis present

## 2016-05-18 DIAGNOSIS — Z961 Presence of intraocular lens: Secondary | ICD-10-CM | POA: Diagnosis present

## 2016-05-18 DIAGNOSIS — N400 Enlarged prostate without lower urinary tract symptoms: Secondary | ICD-10-CM | POA: Diagnosis present

## 2016-05-18 DIAGNOSIS — Z8673 Personal history of transient ischemic attack (TIA), and cerebral infarction without residual deficits: Secondary | ICD-10-CM

## 2016-05-18 HISTORY — DX: Type 2 diabetes mellitus without complications: E11.9

## 2016-05-18 LAB — CBC
HCT: 31.2 % — ABNORMAL LOW (ref 39.0–52.0)
Hemoglobin: 10 g/dL — ABNORMAL LOW (ref 13.0–17.0)
MCH: 30.2 pg (ref 26.0–34.0)
MCHC: 32.1 g/dL (ref 30.0–36.0)
MCV: 94.3 fL (ref 78.0–100.0)
Platelets: 231 10*3/uL (ref 150–400)
RBC: 3.31 MIL/uL — ABNORMAL LOW (ref 4.22–5.81)
RDW: 14.2 % (ref 11.5–15.5)
WBC: 10.1 10*3/uL (ref 4.0–10.5)

## 2016-05-18 LAB — I-STAT TROPONIN, ED: TROPONIN I, POC: 0.01 ng/mL (ref 0.00–0.08)

## 2016-05-18 LAB — BASIC METABOLIC PANEL
Anion gap: 13 (ref 5–15)
BUN: 40 mg/dL — AB (ref 6–20)
CALCIUM: 9.8 mg/dL (ref 8.9–10.3)
CHLORIDE: 96 mmol/L — AB (ref 101–111)
CO2: 25 mmol/L (ref 22–32)
Creatinine, Ser: 4.83 mg/dL — ABNORMAL HIGH (ref 0.61–1.24)
GFR calc Af Amer: 12 mL/min — ABNORMAL LOW (ref >60)
GFR calc non Af Amer: 11 mL/min — ABNORMAL LOW (ref >60)
GLUCOSE: 176 mg/dL — AB (ref 65–99)
Potassium: 4.3 mmol/L (ref 3.5–5.1)
Sodium: 134 mmol/L — ABNORMAL LOW (ref 135–145)

## 2016-05-18 MED ORDER — HEPARIN (PORCINE) IN NACL 100-0.45 UNIT/ML-% IJ SOLN
1050.0000 [IU]/h | INTRAMUSCULAR | Status: DC
  Administered 2016-05-18 (×2): 800 [IU]/h via INTRAVENOUS
  Administered 2016-05-19: 850 [IU]/h via INTRAVENOUS
  Administered 2016-05-21: 1050 [IU]/h via INTRAVENOUS
  Filled 2016-05-18 (×3): qty 250

## 2016-05-18 MED ORDER — NITROGLYCERIN IN D5W 200-5 MCG/ML-% IV SOLN
0.0000 ug/min | Freq: Once | INTRAVENOUS | Status: AC
  Administered 2016-05-18: 5 ug/min via INTRAVENOUS
  Filled 2016-05-18: qty 250

## 2016-05-18 MED ORDER — HEPARIN BOLUS VIA INFUSION
4000.0000 [IU] | Freq: Once | INTRAVENOUS | Status: AC
  Administered 2016-05-18: 4000 [IU] via INTRAVENOUS
  Filled 2016-05-18: qty 4000

## 2016-05-18 MED ORDER — NITROGLYCERIN 2 % TD OINT
1.0000 [in_us] | TOPICAL_OINTMENT | Freq: Once | TRANSDERMAL | Status: AC
  Administered 2016-05-18: 1 [in_us] via TOPICAL
  Filled 2016-05-18: qty 1

## 2016-05-18 NOTE — ED Triage Notes (Signed)
Per EMS pt from home CP intermittent for 1 week, increased in severity 2 hours ago, upper chest, cardiac h/s, non radiating pressure, 7 nitro (3 with EMS), decreased pain every time, denies SOB/N/dizziness, L old fistula (dialysis Tues/Thurs/Sat), has not missed dialysis, L bundle branch block, 324 mg aspirin

## 2016-05-18 NOTE — ED Provider Notes (Signed)
[facility] DEPT Provider Note   CSN: [id number] Arrival date & time: 05/18/16  2213     History   Chief Complaint Chief Complaint  Patient presents with  . Chest Pain    HPI [name] [name] is a 76 y.o. male.  76 year old male with extensive past medical history including CABG x2, T2DM, ESRD on HD T/TH/Sat who p/w chest pain. The patient reports 1 week of intermittent chest pain that he describes as central chest squeezing/pressure. The pain is nonradiating and improves with nitroglycerin. Pain is worse with exertion and associated with mild shortness of breath when the pain is severe. This pain feels exactly like previous MIs. His pain became much worse over the past 2 hours. He has had 7 doses and nitroglycerin, each time the pain improves but then returns. He denies any associated nausea, vomiting, or dizziness. No sudden ripping or tearing chest pain, no radiation to his back, no fever or cough/cold symptoms. He is compliant with his medications and has not missed any dialysis recently.   The history is provided by the patient.    Past Medical History:  Diagnosis Date  . Anemia    Likely secondary to history of GI bleed  . Anginal pain (HCC)    Chronic stable  . Anxiety   . Arthritis   . BPH (benign prostatic hyperplasia)   . CAD (coronary artery disease) of bypass graft 2006, 2012   In 2006: Occluded SVG-OM noted (SVG-diagonal was previously occluded); 2012: Severe lesion in redo SVG-OM1 --> BMS PCI   . CAD in native artery; and the grafts 1992, 1997, 2002, 2006, 2012   CABG 1992 and redo CABG 2006  . CAD S/P percutaneous coronary angioplasty October 2012; Feb, Apr & Oct 2015; Feb 2016   a) s/p CABG 1992 and redo 2006. b) 10/12: NSTEMI: PCI to SVG-OM1 Integrity BMS 3.5 x 15 (3.85 mm) c) 2/15: UA - PCI mid RCA Xience Ap DES 2.75 x 15 (3.1 mm). d) 4/15 : UA - focal dRCA Resolute DES 2.25 x 8 (2.5 mm). e) 10/15: PCI to mRCA ISR w/ post stent lesion - Promus P 2.75 x 16  (3.25 mm). f) 2/16: NSTEMI: CBA/PTCA of  mRCA ISR (3.5 mm post-dilation); g) 7/16 ISR RCA->CBA/PTCA, residual 20%.  . Carotid arterial disease ([place])    a) Duplex 05/2012: R BULB/PROX ICA: 50-69%, L BULB/PROX ICA: 0-49%. ;; 04/29/2014: 61-95% RICA, 09-32% LICA. Patetn vertebrals,  . Chronic low back pain   . Colon cancer [facility]) 2003   colectomy for CA. no recurrence . never required  radiation or chem  . Diabetes mellitus    type -II  . Dyspnea    "when I get too much Fluid"  . End stage renal disease on dialysis [facility])    Dialyzes at [facility]: Tues/Th/Sat - left upper cavity AV fistula brachiocephalic  . Gout   . Heart murmur   . History of hiatal hernia   . History of Non-ST elevated myocardial infarction (non-STEMI) 1992, 2006, 2012; 02/2013  . Hyperlipidemia   . Hypertension   . Hypothyroidism    On supplementation  . Ischemic cardiomyopathy    a) EF 45-50% by echo 2015. b) EF 50% by cath 04/2014.  Marland Kitchen LBBB (left bundle branch block)    Chronic  . Peptic ulcer disease  2004  . Plavix resistance    a) P2Y12 264 in 02/2014 - put on Brilinta but did not tolerate due to SOB. b) cannot be on Effient  due to history of stroke. c) Plavix increased to 75mg  BID and Pletal added 04/2014 due to ISR.  Marland Kitchen Pneumonia   . Skin cancer 02/1999   nose  . Stroke [facility]) 1998    Patient Active Problem List   Diagnosis Date Noted  . ESRD on dialysis ([place]) 05/14/2016  . Anxiety state 02/05/2016  . Chronic stable angina ([place]) 10/02/2015  . Left shoulder pain 03/09/2015  . Hypotension of hemodialysis 01/28/2015  . Coronary stent restenosis due to scar tissue 01/28/2015  . Watery diarrhea 07/01/2014  . Pain in the chest   . Plavix resistance   . LBBB (left bundle branch block) 04/19/2014  . Carotid arterial disease ([place])   . Coronary artery disease involving native coronary artery of native heart with angina pectoris ([place])   . Presence of drug coated stent in right coronary artery 04/30/2013  .  Cough 04/13/2013  . OM (otitis media) 01/21/2013  . History of Non-ST elevated myocardial infarction (non-STEMI)   . Orthopnea 09/29/2012  . CAD (coronary artery disease) of bypass graft - PCI to SVG-OM with BMS; PCI-mid RCA with a Xience DES 09/29/2012  . Routine general medical examination at a health care facility 06/20/2012  . Decreased radial pulse 03/17/2012  . Medication management 12/17/2011  . Breast lump 07/25/2011  . Neoplasm of uncertain behavior of skin 06/18/2011  . 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 CAD S/P percutaneous coronary angioplasty 10/09/2010  . DIARRHEA, CHRONIC 08/17/2009  . HIATAL HERNIA 08/03/2009  . Personal history of other diseases of digestive system 08/03/2009  . RASH-NONVESICULAR 04/14/2009  . ANEMIA, IRON DEFICIENCY 02/18/2009  . RINGWORM 02/15/2009  . Chronic combined systolic and diastolic congestive heart failure, NYHA class 2 ([place]) 02/15/2009  . Hypothyroidism 12/08/2007  . Diabetes mellitus with neuropathy ([place]) 12/08/2007  . Hyperlipidemia with target LDL less than 70 12/08/2007  . Gout 12/08/2007  . Essential hypertension 12/08/2007  . RENAL INSUFFICIENCY, CHRONIC 12/08/2007  . LOW BACK PAIN 12/08/2007  . COLON CANCER, HX OF 12/08/2007  . CEREBROVASCULAR ACCIDENT, HX OF 12/08/2007    Past Surgical History:  Procedure Laterality Date  . AORTIC ARCH ANGIOGRAPHY N/A 05/09/2016   Procedure: Aortic Arch Angiography;  Surgeon: Waynetta Sandy, MD;  Location: [place] CV LAB;  Service: Cardiovascular;  Laterality: N/A;  . APPENDECTOMY    . AV FISTULA REPAIR Left 05/2009  . CARDIAC CATHETERIZATION  10/16/2010   patent  LIMA to the LAD , PATENT  svg TO 2nd marginals ,occluded PLA with collaterals and SVG to the OM  had 90% stenosis  was txlge  bare - metal  3.5  Integrity ten  postdilate  36-37  mm    . CARDIAC CATHETERIZATION  03/22/2004   loss of both SVG,severe disease in prox and ostial  circ not amenable to  invention. mod diease distal LAD  AFTER BYPASS GRAFT;-CVTS to evaluate   . CARDIAC CATHETERIZATION N/A 08/03/2014   Procedure: Left Heart Cath and Cors/Grafts Angiography;  Surgeon: Leonie Man, MD;  Location: [place] CV LAB;  Service: Cardiovascular;  Laterality: N/A;  . CARDIAC CATHETERIZATION N/A 08/03/2014   Procedure: Coronary Balloon Angioplasty;  Surgeon: Leonie Man, MD;  Location: [place] CV LAB;  Service: Cardiovascular;: Aggressive [place] Ball[place] - [place] Balloon PTCA of ISR site in mRCA (3 stent layer)  . CAROTID DOPPLER  05/23/2012   ABN CAROTID-- RGT BULB/PROX ICA mild to mod 50-60%;lft bulb/prox mild to mod 0-49%;left subclavian abn waveforms  consistent with patients lft arm A/V fistula  . CATARACT EXTRACTION W/ INTRAOCULAR LENS  IMPLANT, BILATERAL Bilateral   . CHOLECYSTECTOMY  2009  . COLON RESECTION  2003   transverse and proximal descending w/ primar anastomosis  . COLON SURGERY  2003   "cancer"  . COLONOSCOPY    . CORONARY ANGIOPLASTY  04/03/1995   OM and Circ  . CORONARY ANGIOPLASTY  02/01/2000   CIRC  . CORONARY ANGIOPLASTY  02/09/14   [place] Ball[place] PTCA of mRCA ISR 99% - 3.5 mm  . CORONARY ARTERY BYPASS GRAFT  08/22/1990   INITIAL:LIMA to LAD, SVG to  Diagonal, SVG to OM  . CORONARY ARTERY BYPASS GRAFT  2006   SVG to OM1,SVG to OM2 with patent LIMA  to LAD and occluded vein  graft to the diagonal  and occluded vein grft to OM from  prior  surgery  . DISTAL REVASCULARIZATION AND INTERVAL LIGATION (DRIL) Left 05/14/2016   Procedure: DISTAL REVASCULARIZATION AND INTERVAL LIGATION (DRIL) LEFT ARM;  Surgeon: Waynetta Sandy, MD;  Location: [place];  Service: Vascular;  Laterality: Left;  . DOPPLER ECHOCARDIOGRAPHY  01/25/2012   EF 50 to 55%  . EVENT MONITOR  01/23/2012-02/06/2012   SINUS ,LBBB,unifocal PVCs  . LEFT AND RIGHT HEART CATHETERIZATION WITH CORONARY/GRAFT ANGIOGRAM N/A 04/20/2014   Procedure: LEFT AND RIGHT HEART CATHETERIZATION WITH  Beatrix Fetters;  Surgeon: Sherren Mocha, MD;  Location: [facility] CATH LAB;  Service: Cardiovascular;  Laterality: N/A;  . LEFT HEART CATHETERIZATION WITH CORONARY ANGIOGRAM N/A 02/23/2013   Procedure: LEFT HEART CATHETERIZATION WITH CORONARY ANGIOGRAM;  Surgeon: Lorretta Harp, MD;  Location: [facility] CATH LAB;  Service: Cardiovascular;  Laterality: N/A;  . LEFT HEART CATHETERIZATION WITH CORONARY/GRAFT ANGIOGRAM N/A 04/30/2013   Procedure: LEFT HEART CATHETERIZATION WITH Beatrix Fetters;  Surgeon: Leonie Man, MD;  Location: [facility] LAB;  Service: Cardiovascular;  Laterality: N/A;  . LEFT HEART CATHETERIZATION WITH CORONARY/GRAFT ANGIOGRAM N/A 10/15/2013   Procedure: LEFT HEART CATHETERIZATION WITH Beatrix Fetters;  Surgeon: Leonie Man, MD;  Location: [facility] CATH LAB;  Service: Cardiovascular;  Laterality: N/A;  . LEFT HEART CATHETERIZATION WITH CORONARY/GRAFT ANGIOGRAM N/A 02/09/2014   Procedure: LEFT HEART CATHETERIZATION WITH Beatrix Fetters;  Surgeon: Leonie Man, MD;  Location: [facility] CATH LAB;  Service: Cardiovascular;  Laterality: N/A;  . Lower Extremity Arterial Dopplers  October 2014   Calcified but non-occlusive peripheral arteries. No evidence of stenosis  . NM MYOCAR PERF WALL MOTION  10/12/2011   EF 54%,LV normal ; no signifiant ischemia  . PERCUTANEOUS CORONARY STENT INTERVENTION (PCI-S)  02/23/2013   mid RCA 80% & 70% - Xience 2.75 mm x 15 mm ( 3.2mm) ; LIMA-LAD patent, SVG-OM patent (stent patent); SVG-Diag patent.  Marland Kitchen PERCUTANEOUS CORONARY STENT INTERVENTION (PCI-S)  April 2015   dRCA - Integrity Resolute DES   2.25 mm x 82mm (2.5 mm)  . PERCUTANEOUS CORONARY STENT INTERVENTION (PCI-S)  Oct 15 2013   [place] Angina: mRCA ISR with post-stent stenosis -- [place] PTCA & PCI Promus Premier DES 2.75 mm x 16 mm (3.25 mm)  . POSTERIOR LUMBAR FUSION    . REVISON OF ARTERIOVENOUS FISTULA Left 14/97/0263   Procedure: PLICATION OF LEFT UPPER ARM  ARTERIOVENOUS  FISTULA  ANEURYSM;  Surgeon: Rosetta Posner, MD;  Location: [place];  Service: Vascular;  Laterality: Left;  . TRANSTHORACIC ECHOCARDIOGRAM  02/23/2013   Moderate concentric hypertrophy. EF 4500%. Septal bounce. Grade 2 diastolic dysfunction (pseudo-normal - severely elevated filling pressures) mild to  moderate MR and moderate LA dilation to  . UPPER EXTREMITY ANGIOGRAPHY Left 05/09/2016   Procedure: Upper Extremity Angiography;  Surgeon: Waynetta Sandy, MD;  Location: [place] CV LAB;  Service: Cardiovascular;  Laterality: Left;       Home Medications    Prior to Admission medications   Medication Sig Start Date End Date Taking? Authorizing Provider  acetaminophen (TYLENOL) 500 MG tablet Take 1,000 mg by mouth 2 (two) times daily as needed for mild pain.     [provider]  amLODipine (NORVASC) 5 MG tablet Take 0.5 tablet on non dialysis day  By mouth. Patient taking differently: Take 0.5 tablet on non dialysis day 03/21/16   Leonie Man, MD  atorvastatin (LIPITOR) 20 MG tablet Take 0.5 tablets (10 mg total) by mouth daily. Patient taking differently: Take 10 mg by mouth at bedtime.  12/28/15 05/08/16  Leonie Man, MD  Besifloxacin HCl (BESIVANCE) 0.6 % SUSP Place 1 drop into both eyes See admin instructions. Use eye drops 2 times daily for 2 days following injection by Dr. Zigmund Daniel (every 10 weeks)    [provider]  cilostazol (PLETAL) 100 MG tablet TAKE ONE TABLET BY MOUTH TWICE DAILY 01/13/16   Leonie Man, MD  clonazePAM (KLONOPIN) 0.5 MG tablet TAKE ONE TABLET BY MOUTH TWICE DAILY AS NEEDED FOR ANXIETY 04/11/16   Brunetta Jeans, PA-C  clopidogrel (PLAVIX) 75 MG tablet Take 1 tablet (75 mg total) by mouth daily. 06/17/15   Leonie Man, MD  ferric citrate (AURYXIA) 1 GM 210 MG(Fe) tablet Take 420 mg by mouth 3 (three) times daily with meals.    [provider]  glucose blood (TRUE METRIX BLOOD GLUCOSE TEST) test strip 1 each by Other  route as needed for other. Use as instructed to test sugars 2-3 times daily. Dx. E11.40 11/05/14   Midge Minium, MD  isosorbide mononitrate (IMDUR) 120 MG 24 hr tablet Take 1 tablet (120 mg total) by mouth daily. 06/17/15   Leonie Man, MD  levothyroxine (SYNTHROID, LEVOTHROID) 125 MCG tablet TAKE ONE TABLET BY MOUTH ONCE DAILY BEFORE BREAKFAST Patient taking differently: Take 125 mcg by mouth daily before breakfast.  05/18/15   Midge Minium, MD  lidocaine-prilocaine (EMLA) cream Apply 1 application topically See admin instructions. Apply prior to dialysis treatments on Tuesday, Thursday and Saturday    [provider]  loperamide (IMODIUM A-D) 2 MG tablet Take 4 mg by mouth 2 (two) times daily as needed for diarrhea or loose stools.    [provider]  MELATONIN PO Take 1 tablet by mouth at bedtime as needed (sleep).    [provider]  metoprolol succinate (TOPROL-XL) 50 MG 24 hr tablet Take 1 tablet (50 mg total) by mouth at bedtime. Take with or immediately following a meal. Patient taking differently: Take 25 mg by mouth at bedtime. Take with or immediately following a meal. 04/25/16 07/24/16  Leonie Man, MD  NITROSTAT 0.4 MG SL tablet PLACE 1 TABLET UNDER THE TONGUE EVERY 5 MINUTES AS NEEDED FOR CHEST PAIN (MAX 3 DOSES PER DAY) 01/13/16   Leonie Man, MD  oxyCODONE-acetaminophen (ROXICET) 5-325 MG tablet Take 1 tablet by mouth every 6 (six) hours as needed for severe pain. 05/15/16   Rhyne, Hulen Shouts, PA-C  pantoprazole (PROTONIX) 40 MG tablet Take 1 tablet (40 mg total) by mouth daily. 03/02/16   Gatha Mayer, MD    Family History Family History  Problem Relation Age of Onset  . Heart disease Mother   . Hypertension Mother   . Diabetes Mother   . Heart attack Mother   . Heart disease Father   . Hypertension Father   . Diabetes Father   . Heart attack Father   . Stroke Paternal Aunt   . Stroke Paternal Uncle   . Colon cancer Neg Hx     . Esophageal cancer Neg Hx     Social History Social History  Substance Use Topics  . Smoking status: Never Smoker  . Smokeless tobacco: Never Used  . Alcohol use No     Allergies   Brilinta [ticagrelor] and Shellfish allergy   Review of Systems Review of Systems All other systems reviewed and are negative except that which was mentioned in HPI  Physical Exam Updated Vital Signs BP (!) 134/59   Pulse 81   Temp 97.4 F (36.3 C) (Oral)   Resp 18   Ht 5\' 5"  (1.651 m)   Wt 142 lb (64.4 kg)   SpO2 100%   BMI 23.63 kg/m   Physical Exam  Constitutional: He is oriented to person, place, and time. He appears well-developed and well-nourished. He appears distressed.  In mild distress due to pain  HENT:  Head: Normocephalic and atraumatic.  Moist mucous membranes  Eyes: Conjunctivae are normal. Pupils are equal, round, and reactive to light.  Neck: Neck supple.  Cardiovascular: Normal rate and regular rhythm.   Murmur heard. Pulmonary/Chest: Effort normal and breath sounds normal.  Abdominal: Soft. Bowel sounds are normal. He exhibits no distension. There is no tenderness.  Musculoskeletal: He exhibits no edema.  Fistula L arm with no erythema  Neurological: He is alert and oriented to person, place, and time.  Fluent speech  Skin: Skin is warm and dry.  Psychiatric: He has a normal mood and affect. Judgment normal.  Nursing note and vitals reviewed.    ED Treatments / Results  Labs (all labs ordered are listed, but only abnormal results are displayed) Labs Reviewed  BASIC METABOLIC PANEL - Abnormal; Notable for the following:       Result Value   Sodium 134 (*)    Chloride 96 (*)    Glucose, Bld 176 (*)    BUN 40 (*)    Creatinine, Ser 4.83 (*)    GFR calc non Af Amer 11 (*)    GFR calc Af Amer 12 (*)    All other components within normal limits  CBC - Abnormal; Notable for the following:    RBC 3.31 (*)    Hemoglobin 10.0 (*)    HCT 31.2 (*)    All  other components within normal limits  I-STAT TROPOININ, ED    EKG  EKG Interpretation  Date/Time:  Friday May 18 2016 22:19:48 EDT Ventricular Rate:  82 PR Interval:    QRS Duration: 159 QT Interval:  435 QTC Calculation: 509 R Axis:   43 Text Interpretation:  Sinus rhythm Probable left atrial enlargement IVCD, consider atypical LBBB similar to previous Confirmed by Theotis Burrow [phone]) on 05/18/2016 10:25:27 PM       Radiology Dg Chest Portable 1 View  Result Date: 05/18/2016 CLINICAL DATA:  Chest pain and shortness of breath. History of cancer, diabetes, hypertension. EXAM: PORTABLE CHEST 1 VIEW COMPARISON:  04/27/2014 FINDINGS: Postoperative changes in the mediastinum. Vascular grafts in the left axillary region. Shallow inspiration with linear atelectasis in the lung bases. No focal airspace disease or consolidation. No  blunting of costophrenic angles. No pneumothorax. Calcified aorta. IMPRESSION: Shallow inspiration with linear atelectasis in the lung bases. No consolidation or edema. Electronically Signed   By: Lucienne Capers M.D.   On: 05/18/2016 22:52    Procedures .Critical Care Performed by: Sharlett Iles Authorized by: Sharlett Iles   Critical care provider statement:    Critical care time (minutes):  30   Critical care time was exclusive of:  Separately billable procedures and treating other patients   Critical care was necessary to treat or prevent imminent or life-threatening deterioration of the following conditions:  Cardiac failure   Critical care was time spent personally by me on the following activities:  Development of treatment plan with patient or surrogate, discussions with consultants, evaluation of patient's response to treatment, obtaining history from patient or surrogate, ordering and performing treatments and interventions, ordering and review of laboratory studies, ordering and review of radiographic studies, re-evaluation of  patient's condition and review of old charts   (including critical care time)  Medications Ordered in ED Medications  heparin ADULT infusion 100 units/mL (25000 units/274mL sodium chloride 0.45%) (800 Units/hr Intravenous New Bag/Given 05/18/16 2309)  nitroGLYCERIN (NITROGLYN) 2 % ointment 1 inch (1 inch Topical Given 05/18/16 2235)  heparin bolus via infusion 4,000 Units (4,000 Units Intravenous Bolus from Bag 05/18/16 2310)  nitroGLYCERIN 50 mg in dextrose 5 % 250 mL (0.2 mg/mL) infusion (50 mcg/min Intravenous Rate/Dose Change 05/18/16 2348)     Initial Impression / Assessment and Plan / ED Course  I have reviewed the triage vital signs and the nursing notes.  Pertinent labs & imaging results that were available during my care of the patient were reviewed by me and considered in my medical decision making (see chart for details).    PT w/ extensive cardiac history presents with intermittent chest pain for the past week that has worsened tonight, it really improved with nitroglycerin but always returns. He states this feels exactly like previous MI. On exam he was in mild distress due to pain. He had stable vital signs with BP 162/67. EKG is not significantly changed from previous. Obtained above lab work as well as chest x-ray. No sudden ripping or tearing chest pain and no radiation to back to suggest aortic dissection. Gave nitroglycerin paste and initiated heparin drip due to concern for unstable angina/ACS. Eventually initiated nitro drip for ongoing pain.   On reexamination, his pain is improved to 1/10 on nitro drip. Labs show negative initial troponin, normal potassium, normal WBC, hemoglobin 10. Chest x-ray negative acute. Discussed with cardiology, Dr. Koleen Nimrod and pt admitted for further work up.   Final Clinical Impressions(s) / ED Diagnoses   Final diagnoses:  Chest pain, unspecified type  Unstable angina [facility])    New Prescriptions New Prescriptions   No medications on  file     Sameerah Nachtigal, Wenda Overland, MD 05/19/16 0004

## 2016-05-19 ENCOUNTER — Encounter (HOSPITAL_COMMUNITY): Payer: Self-pay | Admitting: Internal Medicine

## 2016-05-19 DIAGNOSIS — I25810 Atherosclerosis of coronary artery bypass graft(s) without angina pectoris: Secondary | ICD-10-CM | POA: Diagnosis not present

## 2016-05-19 DIAGNOSIS — Y831 Surgical operation with implant of artificial internal device as the cause of abnormal reaction of the patient, or of later complication, without mention of misadventure at the time of the procedure: Secondary | ICD-10-CM | POA: Diagnosis present

## 2016-05-19 DIAGNOSIS — I255 Ischemic cardiomyopathy: Secondary | ICD-10-CM | POA: Diagnosis present

## 2016-05-19 DIAGNOSIS — Z992 Dependence on renal dialysis: Secondary | ICD-10-CM

## 2016-05-19 DIAGNOSIS — Z85828 Personal history of other malignant neoplasm of skin: Secondary | ICD-10-CM | POA: Diagnosis not present

## 2016-05-19 DIAGNOSIS — F419 Anxiety disorder, unspecified: Secondary | ICD-10-CM | POA: Diagnosis present

## 2016-05-19 DIAGNOSIS — I120 Hypertensive chronic kidney disease with stage 5 chronic kidney disease or end stage renal disease: Secondary | ICD-10-CM | POA: Diagnosis not present

## 2016-05-19 DIAGNOSIS — I200 Unstable angina: Secondary | ICD-10-CM | POA: Diagnosis not present

## 2016-05-19 DIAGNOSIS — Z951 Presence of aortocoronary bypass graft: Secondary | ICD-10-CM | POA: Diagnosis not present

## 2016-05-19 DIAGNOSIS — I25110 Atherosclerotic heart disease of native coronary artery with unstable angina pectoris: Secondary | ICD-10-CM | POA: Diagnosis not present

## 2016-05-19 DIAGNOSIS — I2510 Atherosclerotic heart disease of native coronary artery without angina pectoris: Secondary | ICD-10-CM | POA: Diagnosis not present

## 2016-05-19 DIAGNOSIS — E039 Hypothyroidism, unspecified: Secondary | ICD-10-CM | POA: Diagnosis not present

## 2016-05-19 DIAGNOSIS — I10 Essential (primary) hypertension: Secondary | ICD-10-CM | POA: Diagnosis not present

## 2016-05-19 DIAGNOSIS — D508 Other iron deficiency anemias: Secondary | ICD-10-CM | POA: Diagnosis not present

## 2016-05-19 DIAGNOSIS — Z9842 Cataract extraction status, left eye: Secondary | ICD-10-CM | POA: Diagnosis not present

## 2016-05-19 DIAGNOSIS — N186 End stage renal disease: Secondary | ICD-10-CM | POA: Diagnosis not present

## 2016-05-19 DIAGNOSIS — E785 Hyperlipidemia, unspecified: Secondary | ICD-10-CM | POA: Diagnosis present

## 2016-05-19 DIAGNOSIS — I208 Other forms of angina pectoris: Secondary | ICD-10-CM | POA: Diagnosis not present

## 2016-05-19 DIAGNOSIS — Z8673 Personal history of transient ischemic attack (TIA), and cerebral infarction without residual deficits: Secondary | ICD-10-CM | POA: Diagnosis not present

## 2016-05-19 DIAGNOSIS — Z8711 Personal history of peptic ulcer disease: Secondary | ICD-10-CM | POA: Diagnosis not present

## 2016-05-19 DIAGNOSIS — E1122 Type 2 diabetes mellitus with diabetic chronic kidney disease: Secondary | ICD-10-CM | POA: Diagnosis not present

## 2016-05-19 DIAGNOSIS — I252 Old myocardial infarction: Secondary | ICD-10-CM | POA: Diagnosis not present

## 2016-05-19 DIAGNOSIS — Z85038 Personal history of other malignant neoplasm of large intestine: Secondary | ICD-10-CM | POA: Diagnosis not present

## 2016-05-19 DIAGNOSIS — I447 Left bundle-branch block, unspecified: Secondary | ICD-10-CM | POA: Diagnosis present

## 2016-05-19 DIAGNOSIS — Z9861 Coronary angioplasty status: Secondary | ICD-10-CM | POA: Diagnosis not present

## 2016-05-19 DIAGNOSIS — I214 Non-ST elevation (NSTEMI) myocardial infarction: Secondary | ICD-10-CM | POA: Diagnosis not present

## 2016-05-19 DIAGNOSIS — N2581 Secondary hyperparathyroidism of renal origin: Secondary | ICD-10-CM | POA: Diagnosis not present

## 2016-05-19 DIAGNOSIS — N400 Enlarged prostate without lower urinary tract symptoms: Secondary | ICD-10-CM | POA: Diagnosis present

## 2016-05-19 DIAGNOSIS — T82855A Stenosis of coronary artery stent, initial encounter: Secondary | ICD-10-CM | POA: Diagnosis not present

## 2016-05-19 DIAGNOSIS — M1990 Unspecified osteoarthritis, unspecified site: Secondary | ICD-10-CM | POA: Diagnosis present

## 2016-05-19 DIAGNOSIS — M109 Gout, unspecified: Secondary | ICD-10-CM | POA: Diagnosis present

## 2016-05-19 LAB — GLUCOSE, CAPILLARY
GLUCOSE-CAPILLARY: 109 mg/dL — AB (ref 65–99)
Glucose-Capillary: 108 mg/dL — ABNORMAL HIGH (ref 65–99)
Glucose-Capillary: 200 mg/dL — ABNORMAL HIGH (ref 65–99)

## 2016-05-19 LAB — BASIC METABOLIC PANEL
ANION GAP: 10 (ref 5–15)
BUN: 45 mg/dL — AB (ref 6–20)
CO2: 26 mmol/L (ref 22–32)
Calcium: 9.4 mg/dL (ref 8.9–10.3)
Chloride: 98 mmol/L — ABNORMAL LOW (ref 101–111)
Creatinine, Ser: 5.06 mg/dL — ABNORMAL HIGH (ref 0.61–1.24)
GFR calc Af Amer: 12 mL/min — ABNORMAL LOW (ref >60)
GFR, EST NON AFRICAN AMERICAN: 10 mL/min — AB (ref >60)
GLUCOSE: 110 mg/dL — AB (ref 65–99)
Potassium: 4.6 mmol/L (ref 3.5–5.1)
SODIUM: 134 mmol/L — AB (ref 135–145)

## 2016-05-19 LAB — CBC
HEMATOCRIT: 29.2 % — AB (ref 39.0–52.0)
HEMOGLOBIN: 9.4 g/dL — AB (ref 13.0–17.0)
MCH: 30.5 pg (ref 26.0–34.0)
MCHC: 32.2 g/dL (ref 30.0–36.0)
MCV: 94.8 fL (ref 78.0–100.0)
Platelets: 232 10*3/uL (ref 150–400)
RBC: 3.08 MIL/uL — ABNORMAL LOW (ref 4.22–5.81)
RDW: 14.3 % (ref 11.5–15.5)
WBC: 7.9 10*3/uL (ref 4.0–10.5)

## 2016-05-19 LAB — MRSA PCR SCREENING: MRSA by PCR: NEGATIVE

## 2016-05-19 LAB — TROPONIN I
TROPONIN I: 5.75 ng/mL — AB (ref <0.03)
Troponin I: 0.89 ng/mL (ref <0.03)
Troponin I: 3.13 ng/mL (ref <0.03)

## 2016-05-19 LAB — HEPARIN LEVEL (UNFRACTIONATED): Heparin Unfractionated: 0.31 IU/mL (ref 0.30–0.70)

## 2016-05-19 LAB — PHOSPHORUS: Phosphorus: 1.5 mg/dL — ABNORMAL LOW (ref 2.5–4.6)

## 2016-05-19 MED ORDER — METOPROLOL SUCCINATE ER 25 MG PO TB24
25.0000 mg | ORAL_TABLET | Freq: Every day | ORAL | Status: DC
  Administered 2016-05-19 – 2016-05-21 (×3): 25 mg via ORAL
  Filled 2016-05-19 (×3): qty 1

## 2016-05-19 MED ORDER — MORPHINE SULFATE (PF) 4 MG/ML IV SOLN
2.0000 mg | Freq: Once | INTRAVENOUS | Status: AC
  Administered 2016-05-19: 2 mg via INTRAVENOUS
  Filled 2016-05-19: qty 1

## 2016-05-19 MED ORDER — MORPHINE SULFATE (PF) 2 MG/ML IV SOLN
2.0000 mg | INTRAVENOUS | Status: DC | PRN
Start: 2016-05-19 — End: 2016-05-21

## 2016-05-19 MED ORDER — PENTAFLUOROPROP-TETRAFLUOROETH EX AERO: 1.0000 "application " | INHALATION_SPRAY | CUTANEOUS | Status: DC | PRN

## 2016-05-19 MED ORDER — CLONAZEPAM 0.5 MG PO TABS
0.5000 mg | ORAL_TABLET | Freq: Two times a day (BID) | ORAL | Status: DC | PRN
  Administered 2016-05-19 – 2016-05-21 (×4): 0.5 mg via ORAL
  Filled 2016-05-19 (×4): qty 1

## 2016-05-19 MED ORDER — LEVOTHYROXINE SODIUM 25 MCG PO TABS: 125.0000 ug | ORAL_TABLET | Freq: Every day | ORAL | Status: DC

## 2016-05-19 MED ORDER — HEPARIN SODIUM (PORCINE) 1000 UNIT/ML DIALYSIS: 1000.0000 [IU] | INTRAMUSCULAR | Status: DC | PRN

## 2016-05-19 MED ORDER — ACETAMINOPHEN 500 MG PO TABS
1000.0000 mg | ORAL_TABLET | Freq: Two times a day (BID) | ORAL | Status: DC | PRN
  Administered 2016-05-19 – 2016-05-20 (×2): 1000 mg via ORAL
  Filled 2016-05-19 (×2): qty 2

## 2016-05-19 MED ORDER — FERRIC CITRATE 1 GM 210 MG(FE) PO TABS
420.0000 mg | ORAL_TABLET | Freq: Three times a day (TID) | ORAL | Status: DC
  Administered 2016-05-19 – 2016-05-22 (×6): 420 mg via ORAL
  Filled 2016-05-19 (×12): qty 2

## 2016-05-19 MED ORDER — NITROGLYCERIN IN D5W 200-5 MCG/ML-% IV SOLN: 0.0000 ug/min | INTRAVENOUS | Status: DC

## 2016-05-19 MED ORDER — DOXERCALCIFEROL 4 MCG/2ML IV SOLN
2.0000 ug | INTRAVENOUS | Status: DC
  Administered 2016-05-19 – 2016-05-22 (×2): 2 ug via INTRAVENOUS
  Filled 2016-05-19 (×2): qty 2

## 2016-05-19 MED ORDER — DOXERCALCIFEROL 4 MCG/2ML IV SOLN
INTRAVENOUS | Status: AC
  Filled 2016-05-19: qty 2

## 2016-05-19 MED ORDER — AMLODIPINE BESYLATE 2.5 MG PO TABS
2.5000 mg | ORAL_TABLET | ORAL | Status: DC
  Administered 2016-05-20 – 2016-05-21 (×2): 2.5 mg via ORAL
  Filled 2016-05-19: qty 1

## 2016-05-19 MED ORDER — CLOPIDOGREL BISULFATE 75 MG PO TABS
75.0000 mg | ORAL_TABLET | Freq: Every day | ORAL | Status: DC
  Administered 2016-05-19 – 2016-05-22 (×4): 75 mg via ORAL
  Filled 2016-05-19 (×4): qty 1

## 2016-05-19 MED ORDER — LIDOCAINE-PRILOCAINE 2.5-2.5 % EX CREA: 1.0000 "application " | TOPICAL_CREAM | CUTANEOUS | Status: DC

## 2016-05-19 MED ORDER — PANTOPRAZOLE SODIUM 40 MG PO TBEC
40.0000 mg | DELAYED_RELEASE_TABLET | Freq: Every day | ORAL | Status: DC
  Administered 2016-05-19 – 2016-05-22 (×4): 40 mg via ORAL
  Filled 2016-05-19 (×4): qty 1

## 2016-05-19 MED ORDER — SODIUM CHLORIDE 0.9 % IV SOLN: 100.0000 mL | INTRAVENOUS | Status: DC | PRN

## 2016-05-19 MED ORDER — ATORVASTATIN CALCIUM 10 MG PO TABS
10.0000 mg | ORAL_TABLET | Freq: Every day | ORAL | Status: DC
Start: 2016-05-19 — End: 2016-05-22
  Administered 2016-05-19 – 2016-05-21 (×3): 10 mg via ORAL
  Filled 2016-05-19 (×3): qty 1

## 2016-05-19 MED ORDER — ISOSORBIDE MONONITRATE ER 60 MG PO TB24: 120.0000 mg | ORAL_TABLET | Freq: Every day | ORAL | Status: DC

## 2016-05-19 MED ORDER — RENA-VITE PO TABS
1.0000 | ORAL_TABLET | Freq: Every day | ORAL | Status: DC
  Administered 2016-05-19 – 2016-05-21 (×3): 1 via ORAL
  Filled 2016-05-19 (×3): qty 1

## 2016-05-19 MED ORDER — LIDOCAINE-PRILOCAINE 2.5-2.5 % EX CREA: 1.0000 "application " | TOPICAL_CREAM | CUTANEOUS | Status: DC | PRN

## 2016-05-19 MED ORDER — MELATONIN 3 MG PO TABS
1.0000 | ORAL_TABLET | Freq: Every evening | ORAL | Status: DC | PRN
  Administered 2016-05-19: 3 mg via ORAL
  Filled 2016-05-19 (×3): qty 1

## 2016-05-19 MED ORDER — CILOSTAZOL 100 MG PO TABS
100.0000 mg | ORAL_TABLET | Freq: Two times a day (BID) | ORAL | Status: DC
  Administered 2016-05-19 – 2016-05-21 (×6): 100 mg via ORAL
  Filled 2016-05-19 (×6): qty 1

## 2016-05-19 MED ORDER — ONDANSETRON HCL 4 MG/2ML IJ SOLN: 4.0000 mg | Freq: Four times a day (QID) | INTRAMUSCULAR | Status: DC | PRN

## 2016-05-19 MED ORDER — NITROGLYCERIN 0.4 MG SL SUBL: 0.4000 mg | SUBLINGUAL_TABLET | SUBLINGUAL | Status: DC | PRN

## 2016-05-19 MED ORDER — NITROGLYCERIN IN D5W 200-5 MCG/ML-% IV SOLN
2.0000 ug/min | INTRAVENOUS | Status: DC
  Administered 2016-05-19: 55 ug/min via INTRAVENOUS
  Administered 2016-05-19: 60 ug/min via INTRAVENOUS
  Filled 2016-05-19 (×3): qty 250

## 2016-05-19 MED ORDER — LIDOCAINE HCL (PF) 1 % IJ SOLN: 5.0000 mL | INTRAMUSCULAR | Status: DC | PRN

## 2016-05-19 MED ORDER — LEVOTHYROXINE SODIUM 100 MCG PO TABS
125.0000 ug | ORAL_TABLET | Freq: Every day | ORAL | Status: DC
  Administered 2016-05-19 – 2016-05-22 (×4): 125 ug via ORAL
  Filled 2016-05-19 (×5): qty 1

## 2016-05-19 NOTE — Progress Notes (Signed)
ANTICOAGULATION CONSULT NOTE - Initial Consult  Pharmacy Consult for Heparin  Indication: chest pain/ACS  Allergies  Allergen Reactions  . Brilinta [Ticagrelor] Shortness Of Breath  . Shellfish Allergy Other (See Comments)    Causes gout flare-ups    Patient Measurements: Height: 5\' 5"  (165.1 cm) Weight: 142 lb (64.4 kg) IBW/kg (Calculated) : 61.5  Vital Signs: Temp: 97.4 F (36.3 C) (05/11 2220) Temp Source: Oral (05/11 2220) BP: 134/59 (05/11 2345) Pulse Rate: 81 (05/11 2345)  Labs:  Recent Labs  05/18/16 2222  HGB 10.0*  HCT 31.2*  PLT 231  CREATININE 4.83*    Estimated Creatinine Clearance: 11.7 mL/min (A) (by C-G formula based on SCr of 4.83 mg/dL (H)).   Medical History: Past Medical History:  Diagnosis Date  . Anemia    Likely secondary to history of GI bleed  . Anginal pain (HCC)    Chronic stable  . Anxiety   . Arthritis   . BPH (benign prostatic hyperplasia)   . CAD (coronary artery disease) of bypass graft 2006, 2012   In 2006: Occluded SVG-OM noted (SVG-diagonal was previously occluded); 2012: Severe lesion in redo SVG-OM1 --> BMS PCI   . CAD in native artery; and the grafts 1992, 1997, 2002, 2006, 2012   CABG 1992 and redo CABG 2006  . CAD S/P percutaneous coronary angioplasty October 2012; Feb, Apr & Oct 2015; Feb 2016   a) s/p CABG 1992 and redo 2006. b) 10/12: NSTEMI: PCI to SVG-OM1 Integrity BMS 3.5 x 15 (3.85 mm) c) 2/15: UA - PCI mid RCA Xience Ap DES 2.75 x 15 (3.1 mm). d) 4/15 : UA - focal dRCA Resolute DES 2.25 x 8 (2.5 mm). e) 10/15: PCI to mRCA ISR w/ post stent lesion - Promus P 2.75 x 16 (3.25 mm). f) 2/16: NSTEMI: CBA/PTCA of  mRCA ISR (3.5 mm post-dilation); g) 7/16 ISR RCA->CBA/PTCA, residual 20%.  . Carotid arterial disease ([place])    a) Duplex 05/2012: R BULB/PROX ICA: 50-69%, L BULB/PROX ICA: 0-49%. ;; 04/29/2014: 89-37% RICA, 34-28% LICA. Patetn vertebrals,  . Chronic low back pain   . Colon cancer [facility]) 2003   colectomy for CA.  no recurrence . never required  radiation or chem  . Diabetes mellitus    type -II  . Dyspnea    "when I get too much Fluid"  . End stage renal disease on dialysis [facility])    Dialyzes at [facility]: Tues/Th/Sat - left upper cavity AV fistula brachiocephalic  . Gout   . Heart murmur   . History of hiatal hernia   . History of Non-ST elevated myocardial infarction (non-STEMI) 1992, 2006, 2012; 02/2013  . Hyperlipidemia   . Hypertension   . Hypothyroidism    On supplementation  . Ischemic cardiomyopathy    a) EF 45-50% by echo 2015. b) EF 50% by cath 04/2014.  Marland Kitchen LBBB (left bundle branch block)    Chronic  . Peptic ulcer disease  2004  . Plavix resistance    a) P2Y12 264 in 02/2014 - put on Brilinta but did not tolerate due to SOB. b) cannot be on Effient due to history of stroke. c) Plavix increased to 75mg  BID and Pletal added 04/2014 due to ISR.  Marland Kitchen Pneumonia   . Skin cancer 02/1999   nose  . Stroke [facility]) 1998    Assessment: 76 y/o M presents to the ED with CP, starting heparin per pharmacy, Hgb 10, ESRD on HD, PTA meds reviewed.   Goal  of Therapy:  Heparin level 0.3-0.7 units/ml Monitor platelets by anticoagulation protocol: Yes   Plan:  Heparin 4000 units BOLUS Start heparin drip at 800 units/hr 0800 HL Daily CBC/HL Monitor for bleeding   Narda Bonds 05/19/2016,12:02 AM

## 2016-05-19 NOTE — H&P (Signed)
History & Physical    Patient ID: [name] [name] MRN: [id number], DOB/AGE: [id number]   Admit date: 05/18/2016   Primary Physician: Midge Minium, MD Primary Cardiologist: [name], MD   History of Present Illness    [name] [name] is a 76 y.o. 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  [name] [name] says at around 7 pm today while laying in the bed, he started having chest pain.  The pain was located at his upper center chest and went into his neck and both shoulders.  He says the pain feels like pressure and achy at the same time.    The pain has been constant and was relieved some with nitroglycerin taken at home but came back.  His initial pain was a 1/10 in intensity but constant.  Patient denies other related symptoms such as shortness of breath, nausea, lightheadedness, dizziness, or palpitations.  He continues to have pain at this time and rates it a 4/10 in intensity.  He says he has similar chest pain almost twice a week but it is usually relieved within minutes of taking his nitroglycerin.  Pain is similar to the pain he had with his prior heart attacks.  [name] [name] does not report having swelling in his legs, waking up in the middle of the night due to shortness of breath, or problems with laying flat due to shortness of breath.  He reports taking all of his medications as prescribed and has not missed his dialysis.  He is due for dialysis in the morning.    Past Medical History    Past Medical History:  Diagnosis Date  . Anemia    Likely secondary to history of GI bleed  . Anginal pain (HCC)    Chronic stable  . Anxiety   . Arthritis   .  BPH (benign prostatic hyperplasia)   . CAD (coronary artery disease) of bypass graft 2006, 2012   In 2006: Occluded SVG-OM noted (SVG-diagonal was previously occluded); 2012: Severe lesion in redo SVG-OM1 --> BMS PCI   . CAD in native artery; and the grafts 1992, 1997, 2002, 2006, 2012   CABG 1992 and redo CABG 2006  . CAD S/P percutaneous coronary angioplasty October 2012; Feb, Apr & Oct 2015; Feb 2016   a) s/p CABG 1992 and redo 2006. b) 10/12: NSTEMI: PCI to SVG-OM1 Integrity BMS 3.5 x 15 (3.85 mm) c) 2/15: UA - PCI mid RCA Xience Ap DES 2.75 x 15 (3.1 mm). d) 4/15 : UA - focal dRCA Resolute DES 2.25 x 8 (2.5 mm). e) 10/15: PCI to mRCA ISR w/ post stent lesion - Promus P 2.75 x 16 (3.25 mm). f) 2/16: NSTEMI: CBA/PTCA of  mRCA ISR (3.5 mm post-dilation); g) 7/16 ISR RCA->CBA/PTCA, residual 20%.  . Carotid arterial disease ([place])    a) Duplex 05/2012: R BULB/PROX ICA: 50-69%, L BULB/PROX ICA: 0-49%. ;; 04/29/2014: 40-98% RICA, 11-91% LICA. Patetn vertebrals,  . Chronic low back pain   . Colon cancer [facility]) 2003   colectomy for CA. no recurrence . never required  radiation or chem  . Dyspnea    "when I get too much Fluid"  . End stage renal disease on dialysis [facility])  Dialyzes at [facility]: Tues/Th/Sat - left upper cavity AV fistula brachiocephalic  . Gout   . Heart murmur   . History of hiatal hernia   . History of Non-ST elevated myocardial infarction (non-STEMI) 1992, 2006, 2012; 02/2013  . Hyperlipidemia   . Hypertension   . Hypothyroidism    On supplementation  . Ischemic cardiomyopathy    a) EF 45-50% by echo 2015. b) EF 50% by cath 04/2014.  Marland Kitchen LBBB (left bundle branch block)    Chronic  . Peptic ulcer disease  2004  . Plavix resistance    a) P2Y12 264 in 02/2014 - put on Brilinta but did not tolerate due to SOB. b) cannot be on Effient due to history of stroke. c) Plavix increased to 75mg  BID and Pletal added 04/2014 due to ISR.  Marland Kitchen Pneumonia   . Skin cancer 02/1999   nose  .  Stroke [facility]) 1998    Past Surgical History:  Procedure Laterality Date  . AORTIC ARCH ANGIOGRAPHY N/A 05/09/2016   Procedure: Aortic Arch Angiography;  Surgeon: Waynetta Sandy, MD;  Location: [place] CV LAB;  Service: Cardiovascular;  Laterality: N/A;  . APPENDECTOMY    . AV FISTULA REPAIR Left 05/2009  . CARDIAC CATHETERIZATION  10/16/2010   patent  LIMA to the LAD , PATENT  svg TO 2nd marginals ,occluded PLA with collaterals and SVG to the OM  had 90% stenosis  was txlge  bare - metal  3.5  Integrity ten  postdilate  36-37  mm    . CARDIAC CATHETERIZATION  03/22/2004   loss of both SVG,severe disease in prox and ostial  circ not amenable to invention. mod diease distal LAD  AFTER BYPASS GRAFT;-CVTS to evaluate   . CARDIAC CATHETERIZATION N/A 08/03/2014   Procedure: Left Heart Cath and Cors/Grafts Angiography;  Surgeon: Leonie Man, MD;  Location: [place] CV LAB;  Service: Cardiovascular;  Laterality: N/A;  . CARDIAC CATHETERIZATION N/A 08/03/2014   Procedure: Coronary Balloon Angioplasty;  Surgeon: Leonie Man, MD;  Location: [place] CV LAB;  Service: Cardiovascular;: Aggressive Cutting Balloon - [place] Balloon PTCA of ISR site in mRCA (3 stent layer)  . CAROTID DOPPLER  05/23/2012   ABN CAROTID-- RGT BULB/PROX ICA mild to mod 50-60%;lft bulb/prox mild to mod 0-49%;left subclavian abn waveforms consistent with patients lft arm A/V fistula  . CATARACT EXTRACTION W/ INTRAOCULAR LENS  IMPLANT, BILATERAL Bilateral   . CHOLECYSTECTOMY  2009  . COLON RESECTION  2003   transverse and proximal descending w/ primar anastomosis  . COLON SURGERY  2003   "cancer"  . COLONOSCOPY    . CORONARY ANGIOPLASTY  04/03/1995   OM and Circ  . CORONARY ANGIOPLASTY  02/01/2000   CIRC  . CORONARY ANGIOPLASTY  02/09/14   Cutting Balloon PTCA of mRCA ISR 99% - 3.5 mm  . CORONARY ARTERY BYPASS GRAFT  08/22/1990   INITIAL:LIMA to LAD, SVG to  Diagonal, SVG to OM  . CORONARY ARTERY BYPASS GRAFT   2006   SVG to OM1,SVG to OM2 with patent LIMA  to LAD and occluded vein  graft to the diagonal  and occluded vein grft to OM from  prior  surgery  . DISTAL REVASCULARIZATION AND INTERVAL LIGATION (DRIL) Left 05/14/2016   Procedure: DISTAL REVASCULARIZATION AND INTERVAL LIGATION (DRIL) LEFT ARM;  Surgeon: Waynetta Sandy, MD;  Location: [place];  Service: Vascular;  Laterality: Left;  . DOPPLER ECHOCARDIOGRAPHY  01/25/2012   EF 50 to  55%  . EVENT MONITOR  01/23/2012-02/06/2012   SINUS ,LBBB,unifocal PVCs  . LEFT AND RIGHT HEART CATHETERIZATION WITH CORONARY/GRAFT ANGIOGRAM N/A 04/20/2014   Procedure: LEFT AND RIGHT HEART CATHETERIZATION WITH Beatrix Fetters;  Surgeon: Sherren Mocha, MD;  Location[facility] C[facility] LAB;  Service: Cardiovascular;  Laterality: N/A;  . LEFT HEART CATHETERIZATION WITH CORONARY ANGIOGRAM N/A 02/23/2013   Procedure: LEFT HEART CATHETERIZATION WITH CORONARY ANGIOGRAM;  Surgeon: Lorretta Harp, MD;  Location: [facility] CATH LAB;  Service: Cardiovascular;  Laterality: N/A;  . LEFT HEART CATHETERIZATION WITH CORONARY/GRAFT ANGIOGRAM N/A 04/30/2013   Procedure: LEFT HEART CATHETERIZATION WITH Beatrix Fetters;  Surgeon: Leonie Man, MD;  Location: [facility] CATH LAB;  Service: Cardiovascular;  Laterality: N/A;  . LEFT HEART CATHETERIZATION WITH CORONARY/GRAFT ANGIOGRAM N/A 10/15/2013   Procedure: LEFT HEART CATHETERIZATION WITH Beatrix Fetters;  Surgeon: Leonie Man, MD;  Location: [facility] CATH LAB;  Service: Cardiovascular;  Laterality: N/A;  . LEFT HEART CATHETERIZATION WITH CORONARY/GRAFT ANGIOGRAM N/A 02/09/2014   Procedure: LEFT HEART CATHETERIZATION WITH Beatrix Fetters;  Surgeon: Leonie Man, MD;  Location: [facility] CATH LAB;  Service: Cardiovascular;  Laterality: N/A;  . Lower Extremity Arterial Dopplers  October 2014   Calcified but non-occlusive peripheral arteries. No evidence of stenosis  . NM MYOCAR PERF WALL MOTION  10/12/2011   EF 54%,LV normal  ; no signifiant ischemia  . PERCUTANEOUS CORONARY STENT INTERVENTION (PCI-S)  02/23/2013   mid RCA 80% & 70% - Xience 2.75 mm x 15 mm ( 3.55mm) ; LIMA-LAD patent, SVG-OM patent (stent patent); SVG-Diag patent.  Marland Kitchen PERCUTANEOUS CORONARY STENT INTERVENTION (PCI-S)  April 2015   dRCA - Integrity Resolute DES   2.25 mm x 69mm (2.5 mm)  . PERCUTANEOUS CORONARY STENT INTERVENTION (PCI-S)  Oct 15 2013   [place] Angina: mRCA ISR with post-stent stenosis -- Cuting PTCA & PCI Promus Premier DES 2.75 mm x 16 mm (3.25 mm)  . POSTERIOR LUMBAR FUSION    . REVISON OF ARTERIOVENOUS FISTULA Left 02/54/2706   Procedure: PLICATION OF LEFT UPPER ARM  ARTERIOVENOUS FISTULA  ANEURYSM;  Surgeon: Rosetta Posner, MD;  Location: [place];  Service: Vascular;  Laterality: Left;  . TRANSTHORACIC ECHOCARDIOGRAM  02/23/2013   Moderate concentric hypertrophy. EF 4500%. Septal bounce. Grade 2 diastolic dysfunction (pseudo-normal - severely elevated filling pressures) mild to moderate MR and moderate LA dilation to  . UPPER EXTREMITY ANGIOGRAPHY Left 05/09/2016   Procedure: Upper Extremity Angiography;  Surgeon: Waynetta Sandy, MD;  Location: [place] CV LAB;  Service: Cardiovascular;  Laterality: Left;     Allergies  Allergies  Allergen Reactions  . Brilinta [Ticagrelor] Shortness Of Breath  . Shellfish Allergy Other (See Comments)    Causes gout flare-ups     Home Medications    Prior to Admission medications   Medication Sig Start Date End Date Taking? Authorizing Provider  acetaminophen (TYLENOL) 500 MG tablet Take 1,000 mg by mouth 2 (two) times daily as needed for mild pain.    Yes [provider]  amLODipine (NORVASC) 5 MG tablet Take 0.5 tablet on non dialysis day  By mouth. Patient taking differently: Take 0.5 tablet on non dialysis day 03/21/16  Yes Leonie Man, MD  atorvastatin (LIPITOR) 20 MG tablet Take 0.5 tablets (10 mg total) by mouth daily. Patient taking differently: Take 10 mg by  mouth at bedtime.  12/28/15 05/19/16 Yes Leonie Man, MD  Besifloxacin HCl (BESIVANCE) 0.6 % SUSP Place 1 drop into both eyes See admin  instructions. Use eye drops 2 times daily for 2 days following injection by Dr. Zigmund Daniel (every 10 weeks)   Yes [provider]  cilostazol (PLETAL) 100 MG tablet TAKE ONE TABLET BY MOUTH TWICE DAILY 01/13/16  Yes Leonie Man, MD  clonazePAM (KLONOPIN) 0.5 MG tablet TAKE ONE TABLET BY MOUTH TWICE DAILY AS NEEDED FOR ANXIETY 04/11/16  Yes Brunetta Jeans, PA-C  clopidogrel (PLAVIX) 75 MG tablet Take 1 tablet (75 mg total) by mouth daily. 06/17/15  Yes Leonie Man, MD  ferric citrate (AURYXIA) 1 GM 210 MG(Fe) tablet Take 420 mg by mouth 3 (three) times daily with meals.   Yes [provider]  glucose blood (TRUE METRIX BLOOD GLUCOSE TEST) test strip 1 each by Other route as needed for other. Use as instructed to test sugars 2-3 times daily. Dx. E11.40 11/05/14  Yes Midge Minium, MD  isosorbide mononitrate (IMDUR) 120 MG 24 hr tablet Take 1 tablet (120 mg total) by mouth daily. 06/17/15  Yes Leonie Man, MD  levothyroxine (SYNTHROID, LEVOTHROID) 125 MCG tablet TAKE ONE TABLET BY MOUTH ONCE DAILY BEFORE BREAKFAST 05/18/15  Yes Midge Minium, MD  lidocaine-prilocaine (EMLA) cream Apply 1 application topically See admin instructions. Apply prior to dialysis treatments on Tuesday, Thursday and Saturday   Yes [provider]  loperamide (IMODIUM A-D) 2 MG tablet Take 4 mg by mouth 2 (two) times daily as needed for diarrhea or loose stools.   Yes [provider]  MELATONIN PO Take 1 tablet by mouth at bedtime as needed (sleep).   Yes [provider]  metoprolol succinate (TOPROL-XL) 50 MG 24 hr tablet Take 1 tablet (50 mg total) by mouth at bedtime. Take with or immediately following a meal. Patient taking differently: Take 25 mg by mouth at bedtime. Take with or immediately following a meal. 04/25/16 07/24/16  Yes Leonie Man, MD  NITROSTAT 0.4 MG SL tablet PLACE 1 TABLET UNDER THE TONGUE EVERY 5 MINUTES AS NEEDED FOR CHEST PAIN (MAX 3 DOSES PER DAY) 01/13/16  Yes Leonie Man, MD  oxyCODONE-acetaminophen (ROXICET) 5-325 MG tablet Take 1 tablet by mouth every 6 (six) hours as needed for severe pain. 05/15/16  Yes Rhyne, Samantha J, PA-C  pantoprazole (PROTONIX) 40 MG tablet Take 1 tablet (40 mg total) by mouth daily. 03/02/16  Yes Gatha Mayer, MD    Family History    Family History  Problem Relation Age of Onset  . Heart disease Mother   . Hypertension Mother   . Diabetes Mother   . Heart attack Mother   . Heart disease Father   . Hypertension Father   . Diabetes Father   . Heart attack Father   . Stroke Paternal Aunt   . Stroke Paternal Uncle   . Colon cancer Neg Hx   . Esophageal cancer Neg Hx     Social History    Social History   Social History  . Marital status: Married    Spouse name: N/A  . Number of children: 3  . Years of education: N/A   Occupational History  . Not on file.   Social History Main Topics  . Smoking status: Never Smoker  . Smokeless tobacco: Never Used  . Alcohol use No  . Drug use: No  . Sexual activity: No   Other Topics Concern  . Not on file   Social History Narrative   Long-term patient of Dr. Rollene Fare.   Married father of  3, grandfather 5.   Never smoked.   Not exercising D2 hip and back pain & now Angina     Review of Systems     All other systems reviewed and are otherwise negative except as noted above.  Physical Exam    Blood pressure (!) 145/64, pulse 89, temperature 97.4 F (36.3 C), temperature source Oral, resp. rate 19, height 5\' 5"  (1.651 m), weight 64.4 kg (142 lb), SpO2 99 %.  General: Well developed, well nourished,male in no acute distress. Head: Normocephalic, atraumatic, sclera non-icteric, no xanthomas, nares are without discharge. Dentition:  Neck: No carotid bruits. JVD not elevated.  Lungs:  Respirations regular and unlabored, without wheezes or rales.  Heart: Regular rate and rhythm. No S3 or S4. Soft S1/S2.  2/6 crescendo murmur heard best at the LSB.  No rubs or gallops appreciated. Abdomen: Soft, non-tender, non-distended with normoactive bowel sounds. No hepatomegaly. No rebound/guarding. No obvious abdominal masses. Msk:  Strength and tone appear normal for age. No joint deformities or effusions. Extremities: No clubbing or cyanosis. No edema.  Distal pedal pulses are 2+ bilaterally. Neuro: Alert and oriented X 3. Moves all extremities spontaneously. No focal deficits noted. Psych:  Responds to questions appropriately with a normal affect. Skin: No rashes or lesions noted  Labs    Troponin (Point of Care Test)  Recent Labs  05/18/16 2249  TROPIPOC 0.01   No results for input(s): CKTOTAL, CKMB, TROPONINI in the last 72 hours. Lab Results  Component Value Date   WBC 10.1 05/18/2016   HGB 10.0 (L) 05/18/2016   HCT 31.2 (L) 05/18/2016   MCV 94.3 05/18/2016   PLT 231 05/18/2016    Recent Labs Lab 05/18/16 2222  NA 134*  K 4.3  CL 96*  CO2 25  BUN 40*  CREATININE 4.83*  CALCIUM 9.8  GLUCOSE 176*   Lab Results  Component Value Date   CHOL 107 08/10/2015   HDL 28.80 (L) 08/10/2015   LDLCALC 56 08/10/2015   TRIG 113.0 08/10/2015   No results found for: DDIMER  No results found for: BNP Pro B Natriuretic peptide (BNP)  Date/Time Value Ref Range Status  02/22/2013 08:50 PM 3,970.0 (H) 0 - 125 pg/mL Final  10/15/2010 05:20 AM 2517.0 (H) 0 - 125 pg/mL Final   No results for input(s): INR in the last 72 hours.    Radiology Studies    Dg Chest Portable 1 View  Result Date: 05/18/2016 CLINICAL DATA:  Chest pain and shortness of breath. History of cancer, diabetes, hypertension. EXAM: PORTABLE CHEST 1 VIEW COMPARISON:  04/27/2014 FINDINGS: Postoperative changes in the mediastinum. Vascular grafts in the left axillary region. Shallow inspiration with  linear atelectasis in the lung bases. No focal airspace disease or consolidation. No blunting of costophrenic angles. No pneumothorax. Calcified aorta. IMPRESSION: Shallow inspiration with linear atelectasis in the lung bases. No consolidation or edema. Electronically Signed   By: Lucienne Capers M.D.   On: 05/18/2016 22:52    EKG & Cardiac Imaging    EKG: 05/18/16 (23:13):  NSR; LBBB, non-specific ST and T-wave changes   ECHOCARDIOGRAM:  Assessment & Plan    Active Problems:   ANEMIA, IRON DEFICIENCY   Essential hypertension   Unstable angina (HCC)   Chest pain   Chronic stable angina (HCC)   ESRD on dialysis [facility])  # Chest pain Patient with active chest pain concerning for angina in the setting of known multivessel coronary artery disease but negative troponin at this time.  Patient could be suffering from unstable angina.  He does have a history of hiatal hernia, but chest pain characterization is less concerning for gastrointestinal related chest pain.  Low suspicion for non-coronary related chest pain at this time such as pulmonary embolism.  Another consideration is coronary artery steal syndrome in the setting of known left arm steal syndrome prior and prior bypass surgery.   His ECG is unchanged from prior without any new signs that would be concerning for ischemia/infarction in the setting of known LBBB.  Troponin levels are normal at this time.  Patient has been started on heparin drip and given aspirin in the emergency department.  Nitroglycerin drip was also started in the emergency department for his chest pain.  He continues to have active chest pain.   - Continue heparin and Plavix at this time for potential NSTEMI-ACS. - Consider left heart catheterization for further evaluation during this hospitalization. - Continue with nitrolgycerin drip for now. - Morphine as needed.    # Hypertension Patient with known essential hypertension with documentation of hypotension during  dialysis.  Patient with renal complications. Blood pressure is above goal at this time but unlikely causing his chest pain symptoms. - Restart home amlodipine. - Continue to monitor.    # End-stage renal disease Patient with end-stage renal disease potentially due to essential hypertension and prior diabetes.  His electrolytes are within normal range.  He receives hemodialysis through a left AV fistula Tue/Thu/Sat.  Patient takes ferric citrate. - Will need to consult nephrology in the morning to arrange for dialysis.    # Hypothyroidism Patient with known history of hypothyroidism.  He currently takes levothyroxine at home. - Continue home levothyroxine.    # Anemia Patient with normocytic anemia due to renal failure.  Hemoglobin is closer to his most recent check.  Anemia unlikely to be causing his chest pain symptoms and patient is asymptomatic. - No additional management.    # Prophylaxis - Heparin drip  Signed, Jon Billings, MD 05/19/2016, 1:04 AM

## 2016-05-19 NOTE — Progress Notes (Signed)
Subjective:  Patient has been admitted with recurrent chest discomfort.  Complicated history with previous redo bypass grafting and multiple stenting procedures of saphenous vein graft disease.  Had prolonged recurrent chest discomfort and has significant abnormal baseline EKG.  He is pain-free this morning on heparin and IV nitroglycerin.  Troponin is mildly elevated.  Pain-free at the present time.  Objective:  Vital Signs in the last 24 hours: BP (!) 151/61 (BP Location: Right Arm)   Pulse 71   Temp 98.5 F (36.9 C) (Oral)   Resp 13   Ht 5\' 5"  (1.651 m)   Wt 66.4 kg (146 lb 4.8 oz)   SpO2 100%   BMI 24.35 kg/m   Physical Exam: Send pleasant male in no acute distress Lungs:  Clear Cardiac:  Regular rhythm, normal S1 and S2, no S3 Abdomen:  Soft, nontender, no masses Extremities: Dialysis fistula present in the left upper extremity  Intake/Output from previous day: 05/11 0701 - 05/12 0700 In: 109.6 [I.V.:109.6] Out: 0   Weight Filed Weights   05/18/16 2220 05/19/16 0258  Weight: 64.4 kg (142 lb) 66.4 kg (146 lb 4.8 oz)    Lab Results: Basic Metabolic Panel:  Recent Labs  05/18/16 2222  NA 134*  K 4.3  CL 96*  CO2 25  GLUCOSE 176*  BUN 40*  CREATININE 4.83*   CBC:  Recent Labs  05/18/16 2222  WBC 10.1  HGB 10.0*  HCT 31.2*  MCV 94.3  PLT 231   Cardiac Enzymes: Troponin (Point of Care Test)  Recent Labs  05/18/16 2249  TROPIPOC 0.01   Cardiac Panel (last 3 results)  Recent Labs  05/19/16 0359  TROPONINI 0.89*    Telemetry: Sinus rhythm, personally reviewed by me  Assessment/Plan:  1.  Prolonged chest discomfort with abnormal troponin and possible non-STEMI 2.  Severe CAD with previous redo bypass grafting and multiple stenting procedures and PCI of the vein graft disease and native coronary artery 3.  End-stage renal disease on dialysis  Recommendations:  Consultation to renal for dialysis resumption.  Likely will need repeat  coronary evaluation on Monday.  Continue heparin and IV nitroglycerin until then.     Kerry Hough  MD [facility]  05/19/2016, 8:29 AM

## 2016-05-19 NOTE — Procedures (Signed)
Pt seen on HD.  Ap 120 Vp 150 BFR 350.  Tolerating HD well so far

## 2016-05-19 NOTE — Progress Notes (Signed)
Patient has a Troponin of 3.13 during his morning labs. Cardiology MD was paged to review labs in case this was missed.

## 2016-05-19 NOTE — Consult Note (Addendum)
[facility] Renal Consultation Note    Indication for Consultation:  Management of ESRD/hemodialysis; anemia, hypertension/volume and secondary hyperparathyroidism  HPI: [name] [name] is a 75 y.o. male with ESRD on HD, HTN, CAD s/p CABG. 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   Admitted from ED overnight with ongoing chest pain concerning fo NSTEMI/ACS with his history. CXR unremarkable. Baseline ECG abnormal and troponin elevated this am. Currently on IV nitro/heparin. Seen by cardiology and recommending repeat coronary evaluation.  Seen in bed currently. Sternal chest pain started last night around 7pm and he says "I knew what it was". Denies chest pain a present and appears comfortable in bed. Denies dyspnea, nausea, vomiting, diarrhea, abdominal pain.  Dialyzes at [place]. Last HD was 5/10 AVF was cannulated and able to complete full treatment meeting target weight.   Past Medical History:  Diagnosis Date  . Anemia    Likely secondary to history of GI bleed  . Anginal pain (HCC)    Chronic stable  . Anxiety   . Arthritis   . BPH (benign prostatic hyperplasia)   . CAD (coronary artery disease) of bypass graft 2006, 2012   In 2006: Occluded SVG-OM noted (SVG-diagonal was previously occluded); 2012: Severe lesion in redo SVG-OM1 --> BMS PCI   . CAD in native artery; and the grafts 1992, 1997, 2002, 2006, 2012   CABG 1992 and redo CABG 2006  . CAD S/P percutaneous coronary angioplasty October 2012; Feb, Apr & Oct 2015; Feb 2016   a) s/p CABG 1992 and redo 2006. b) 10/12: NSTEMI: PCI to SVG-OM1 Integrity BMS 3.5 x 15 (3.85 mm) c) 2/15: UA - PCI mid RCA Xience Ap DES 2.75 x 15 (3.1 mm). d) 4/15 : UA - focal dRCA Resolute DES 2.25 x 8 (2.5 mm). e) 10/15: PCI to mRCA ISR w/ post stent lesion - Promus P 2.75 x 16 (3.25 mm). f) 2/16: NSTEMI: CBA/PTCA of  mRCA ISR (3.5 mm post-dilation);  g) 7/16 ISR RCA->CBA/PTCA, residual 20%.  . Carotid arterial disease ([place])    a) Duplex 05/2012: R BULB/PROX ICA: 50-69%, L BULB/PROX ICA: 0-49%. ;; 04/29/2014: 23-76% RICA, 28-31% LICA. Patetn vertebrals,  . Chronic low back pain   . Colon cancer [facility]) 2003   colectomy for CA. no recurrence . never required  radiation or chem  . Dyspnea    "when I get too much Fluid"  . End stage renal disease on dialysis [facility])    Dialyzes at [facility]: Tues/Th/Sat - left upper cavity AV fistula brachiocephalic  . Gout   . Heart murmur   . History of hiatal hernia   . History of Non-ST elevated myocardial infarction (non-STEMI) 1992, 2006, 2012; 02/2013  . Hyperlipidemia   . Hypertension   . Hypothyroidism    On supplementation  . Ischemic cardiomyopathy    a) EF 45-50% by echo 2015. b) EF 50% by cath 04/2014.  Marland Kitchen LBBB (left bundle branch block)    Chronic  . Peptic ulcer disease  2004  . Plavix resistance    a) P2Y12 264 in 02/2014 - put on Brilinta but did not tolerate due to SOB. b) cannot be on Effient due to history of stroke. c) Plavix increased to 75mg  BID and Pletal added 04/2014 due to ISR.  Marland Kitchen Pneumonia   . Skin cancer 02/1999   nose  . Stroke [facility]) 1998   Past Surgical  History:  Procedure Laterality Date  . AORTIC ARCH ANGIOGRAPHY N/A 05/09/2016   Procedure: Aortic Arch Angiography;  Surgeon: Waynetta Sandy, MD;  Location: [place] CV LAB;  Service: Cardiovascular;  Laterality: N/A;  . APPENDECTOMY    . AV FISTULA REPAIR Left 05/2009  . CARDIAC CATHETERIZATION  10/16/2010   patent  LIMA to the LAD , PATENT  svg TO 2nd marginals ,occluded PLA with collaterals and SVG to the OM  had 90% stenosis  was txlge  bare - metal  3.5  Integrity ten  postdilate  36-37  mm    . CARDIAC CATHETERIZATION  03/22/2004   loss of both SVG,severe disease in prox and ostial  circ not amenable to invention. mod diease distal LAD  AFTER BYPASS GRAFT;-CVTS to evaluate   . CARDIAC CATHETERIZATION  N/A 08/03/2014   Procedure: Left Heart Cath and Cors/Grafts Angiography;  Surgeon: Leonie Man, MD;  Location: [place] CV LAB;  Service: Cardiovascular;  Laterality: N/A;  . CARDIAC CATHETERIZATION N/A 08/03/2014   Procedure: Coronary Balloon Angioplasty;  Surgeon: Leonie Man, MD;  Location: [place] CV LAB;  Service: Cardiovascular;: Aggressive Cutting Balloon - [place] Balloon PTCA of ISR site in mRCA (3 stent layer)  . CAROTID DOPPLER  05/23/2012   ABN CAROTID-- RGT BULB/PROX ICA mild to mod 50-60%;lft bulb/prox mild to mod 0-49%;left subclavian abn waveforms consistent with patients lft arm A/V fistula  . CATARACT EXTRACTION W/ INTRAOCULAR LENS  IMPLANT, BILATERAL Bilateral   . CHOLECYSTECTOMY  2009  . COLON RESECTION  2003   transverse and proximal descending w/ primar anastomosis  . COLON SURGERY  2003   "cancer"  . COLONOSCOPY    . CORONARY ANGIOPLASTY  04/03/1995   OM and Circ  . CORONARY ANGIOPLASTY  02/01/2000   CIRC  . CORONARY ANGIOPLASTY  02/09/14   Cutting Balloon PTCA of mRCA ISR 99% - 3.5 mm  . CORONARY ARTERY BYPASS GRAFT  08/22/1990   INITIAL:LIMA to LAD, SVG to  Diagonal, SVG to OM  . CORONARY ARTERY BYPASS GRAFT  2006   SVG to OM1,SVG to OM2 with patent LIMA  to LAD and occluded vein  graft to the diagonal  and occluded vein grft to OM from  prior  surgery  . DISTAL REVASCULARIZATION AND INTERVAL LIGATION (DRIL) Left 05/14/2016   Procedure: DISTAL REVASCULARIZATION AND INTERVAL LIGATION (DRIL) LEFT ARM;  Surgeon: Waynetta Sandy, MD;  Location: [place];  Service: Vascular;  Laterality: Left;  . DOPPLER ECHOCARDIOGRAPHY  01/25/2012   EF 50 to 55%  . EVENT MONITOR  01/23/2012-02/06/2012   SINUS ,LBBB,unifocal PVCs  . LEFT AND RIGHT HEART CATHETERIZATION WITH CORONARY/GRAFT ANGIOGRAM N/A 04/20/2014   Procedure: LEFT AND RIGHT HEART CATHETERIZATION WITH Beatrix Fetters;  Surgeon: Sherren Mocha, MD;  Location: [facility] CATH LAB;  Service: Cardiovascular;   Laterality: N/A;  . LEFT HEART CATHETERIZATION WITH CORONARY ANGIOGRAM N/A 02/23/2013   Procedure: LEFT HEART CATHETERIZATION WITH CORONARY ANGIOGRAM;  Surgeon: Lorretta Harp, MD;  Location: [facility] CATH LAB;  Service: Cardiovascular;  Laterality: N/A;  . LEFT HEART CATHETERIZATION WITH CORONARY/GRAFT ANGIOGRAM N/A 04/30/2013   Procedure: LEFT HEART CATHETERIZATION WITH Beatrix Fetters;  Surgeon: Leonie Man, MD;  Location: [facility] CATH LAB;  Service: Cardiovascular;  Laterality: N/A;  . LEFT HEART CATHETERIZATION WITH CORONARY/GRAFT ANGIOGRAM N/A 10/15/2013   Procedure: LEFT HEART CATHETERIZATION WITH Beatrix Fetters;  Surgeon: Leonie Man, MD;  Location: [facility] CATH LAB;  Service: Cardiovascular;  Laterality: N/A;  . LEFT  HEART CATHETERIZATION WITH CORONARY/GRAFT ANGIOGRAM N/A 02/09/2014   Procedure: LEFT HEART CATHETERIZATION WITH Beatrix Fetters;  Surgeon: Leonie Man, MD;  Location: [facility] CATH LAB;  Service: Cardiovascular;  Laterality: N/A;  . Lower Extremity Arterial Dopplers  October 2014   Calcified but non-occlusive peripheral arteries. No evidence of stenosis  . NM MYOCAR PERF WALL MOTION  10/12/2011   EF 54%,LV normal ; no signifiant ischemia  . PERCUTANEOUS CORONARY STENT INTERVENTION (PCI-S)  02/23/2013   mid RCA 80% & 70% - Xience 2.75 mm x 15 mm ( 3.32mm) ; LIMA-LAD patent, SVG-OM patent (stent patent); SVG-Diag patent.  Marland Kitchen PERCUTANEOUS CORONARY STENT INTERVENTION (PCI-S)  April 2015   dRCA - Integrity Resolute DES   2.25 mm x 43mm (2.5 mm)  . PERCUTANEOUS CORONARY STENT INTERVENTION (PCI-S)  Oct 15 2013   [place] Angina: mRCA ISR with post-stent stenosis -- C[place] PTCA & PCI Promus Premier DES 2.75 mm x 16 mm (3.25 mm)  . POSTERIOR LUMBAR FUSION    . REVISON OF ARTERIOVENOUS FISTULA Left 17/61/6073   Procedure: PLICATION OF LEFT UPPER ARM  ARTERIOVENOUS FISTULA  ANEURYSM;  Surgeon: Rosetta Posner, MD;  Location: [place];  Service: Vascular;  Laterality: Left;  .  TRANSTHORACIC ECHOCARDIOGRAM  02/23/2013   Moderate concentric hypertrophy. EF 4500%. Septal bounce. Grade 2 diastolic dysfunction (pseudo-normal - severely elevated filling pressures) mild to moderate MR and moderate LA dilation to  . UPPER EXTREMITY ANGIOGRAPHY Left 05/09/2016   Procedure: Upper Extremity Angiography;  Surgeon: Waynetta Sandy, MD;  Location: [place] CV LAB;  Service: Cardiovascular;  Laterality: Left;   Family History  Problem Relation Age of Onset  . Heart disease Mother   . Hypertension Mother   . Diabetes Mother   . Heart attack Mother   . Heart disease Father   . Hypertension Father   . Diabetes Father   . Heart attack Father   . Stroke Paternal Aunt   . Stroke Paternal Uncle   . Colon cancer Neg Hx   . Esophageal cancer Neg Hx    Social History:  reports that he has never smoked. He has never used smokeless tobacco. He reports that he does not drink alcohol or use drugs. Allergies  Allergen Reactions  . Brilinta [Ticagrelor] Shortness Of Breath  . Shellfish Allergy Other (See Comments)    Causes gout flare-ups   Prior to Admission medications   Medication Sig Start Date End Date Taking? Authorizing Provider  acetaminophen (TYLENOL) 500 MG tablet Take 1,000 mg by mouth 2 (two) times daily as needed for mild pain.    Yes [provider]  amLODipine (NORVASC) 5 MG tablet Take 0.5 tablet on non dialysis day  By mouth. Patient taking differently: Take 0.5 tablet on non dialysis day 03/21/16  Yes Leonie Man, MD  atorvastatin (LIPITOR) 20 MG tablet Take 0.5 tablets (10 mg total) by mouth daily. Patient taking differently: Take 10 mg by mouth at bedtime.  12/28/15 05/19/16 Yes Leonie Man, MD  Besifloxacin HCl (BESIVANCE) 0.6 % SUSP Place 1 drop into both eyes See admin instructions. Use eye drops 2 times daily for 2 days following injection by Dr. Zigmund Daniel (every 10 weeks)   Yes [provider]  cilostazol (PLETAL) 100 MG  tablet TAKE ONE TABLET BY MOUTH TWICE DAILY 01/13/16  Yes Leonie Man, MD  clonazePAM (KLONOPIN) 0.5 MG tablet TAKE ONE TABLET BY MOUTH TWICE DAILY AS NEEDED FOR ANXIETY 04/11/16  Yes Brunetta Jeans,  PA-C  clopidogrel (PLAVIX) 75 MG tablet Take 1 tablet (75 mg total) by mouth daily. 06/17/15  Yes Leonie Man, MD  ferric citrate (AURYXIA) 1 GM 210 MG(Fe) tablet Take 420 mg by mouth 3 (three) times daily with meals.   Yes [provider]  glucose blood (TRUE METRIX BLOOD GLUCOSE TEST) test strip 1 each by Other route as needed for other. Use as instructed to test sugars 2-3 times daily. Dx. E11.40 11/05/14  Yes Midge Minium, MD  isosorbide mononitrate (IMDUR) 120 MG 24 hr tablet Take 1 tablet (120 mg total) by mouth daily. 06/17/15  Yes Leonie Man, MD  levothyroxine (SYNTHROID, LEVOTHROID) 125 MCG tablet TAKE ONE TABLET BY MOUTH ONCE DAILY BEFORE BREAKFAST 05/18/15  Yes Midge Minium, MD  lidocaine-prilocaine (EMLA) cream Apply 1 application topically See admin instructions. Apply prior to dialysis treatments on Tuesday, Thursday and Saturday   Yes [provider]  loperamide (IMODIUM A-D) 2 MG tablet Take 4 mg by mouth 2 (two) times daily as needed for diarrhea or loose stools.   Yes [provider]  MELATONIN PO Take 1 tablet by mouth at bedtime as needed (sleep).   Yes [provider]  metoprolol succinate (TOPROL-XL) 50 MG 24 hr tablet Take 1 tablet (50 mg total) by mouth at bedtime. Take with or immediately following a meal. Patient taking differently: Take 25 mg by mouth at bedtime. Take with or immediately following a meal. 04/25/16 07/24/16 Yes Leonie Man, MD  NITROSTAT 0.4 MG SL tablet PLACE 1 TABLET UNDER THE TONGUE EVERY 5 MINUTES AS NEEDED FOR CHEST PAIN (MAX 3 DOSES PER DAY) 01/13/16  Yes Leonie Man, MD  oxyCODONE-acetaminophen (ROXICET) 5-325 MG tablet Take 1 tablet by mouth every 6 (six) hours as needed for severe pain.  05/15/16  Yes Rhyne, Samantha J, PA-C  pantoprazole (PROTONIX) 40 MG tablet Take 1 tablet (40 mg total) by mouth daily. 03/02/16  Yes Gatha Mayer, MD   Current Facility-Administered Medications  Medication Dose Route Frequency Provider Last Rate Last Dose  . 0.9 %  sodium chloride infusion  100 mL Intravenous PRN Loren Racer, PA-C      . 0.9 %  sodium chloride infusion  100 mL Intravenous PRN Loren Racer, PA-C      . acetaminophen (TYLENOL) tablet 1,000 mg  1,000 mg Oral BID PRN Susanne Greenhouse, MD   1,000 mg at 05/19/16 1146  . [START ON 05/20/2016] amLODipine (NORVASC) tablet 2.5 mg  2.5 mg Oral Once per day on Sun Mon Wed Fri Susanne Greenhouse, MD      . atorvastatin (LIPITOR) tablet 10 mg  10 mg Oral QHS Liborio Nixon H, MD      . cilostazol (PLETAL) tablet 100 mg  100 mg Oral BID Susanne Greenhouse, MD   100 mg at 05/19/16 0856  . clonazePAM (KLONOPIN) tablet 0.5 mg  0.5 mg Oral BID PRN Susanne Greenhouse, MD      . clopidogrel (PLAVIX) tablet 75 mg  75 mg Oral Daily Susanne Greenhouse, MD   75 mg at 05/19/16 3710  . ferric citrate (AURYXIA) tablet 420 mg  420 mg Oral TID WC Susanne Greenhouse, MD   420 mg at 05/19/16 0931  . heparin ADULT infusion 100 units/mL (25000 units/234mL sodium chloride 0.45%)  850 Units/hr Intravenous Continuous Honor Loh, RPH 8.5 mL/hr at 05/19/16 1128 850 Units/hr at 05/19/16 1128  . levothyroxine (SYNTHROID, LEVOTHROID)  tablet 125 mcg  125 mcg Oral QAC breakfast Susanne Greenhouse, MD   125 mcg at 05/19/16 0093  . Melatonin TABS 3 mg  1 tablet Oral QHS PRN Susanne Greenhouse, MD      . metoprolol succinate (TOPROL-XL) 24 hr tablet 25 mg  25 mg Oral QHS Liborio Nixon H, MD      . morphine 2 MG/ML injection 2 mg  2 mg Intravenous Q4H PRN Susanne Greenhouse, MD      . nitroGLYCERIN (NITROSTAT) SL tablet 0.4 mg  0.4 mg Sublingual Q5 Min x 3 PRN Liborio Nixon H, MD      . nitroGLYCERIN 50 mg in dextrose 5 % 250 mL (0.2 mg/mL)  infusion  2-200 mcg/min Intravenous Titrated Susanne Greenhouse, MD      . ondansetron [facility]) injection 4 mg  4 mg Intravenous Q6H PRN Susanne Greenhouse, MD      . pantoprazole (PROTONIX) EC tablet 40 mg  40 mg Oral Daily Susanne Greenhouse, MD   40 mg at 05/19/16 0927    ROS: As per HPI otherwise negative.  Physical Exam: Vitals:   05/19/16 0150 05/19/16 0200 05/19/16 0241 05/19/16 0258  BP: (!) 129/59 (!) 145/65  (!) 151/61  Pulse: 79 80  71  Resp: 12 12  13   Temp:   98.5 F (36.9 C) 98.5 F (36.9 C)  TempSrc:   Oral Oral  SpO2: 97% 99%  100%  Weight:    66.4 kg (146 lb 4.8 oz)  Height:    5\' 5"  (1.651 m)     General: WDWN elderly WM NAD Head: NCAT sclera not icteric MMM Neck: Supple. No JVD No masses Lungs: CTAB. Breathing is unlabored. Heart: RRR Abdomen: soft NT + BS Lower extremities:without edema or ischemic changes, no open wounds  Neuro: A & O  X 3. Moves all extremities spontaneously. Psych:  Responds to questions appropriately with a normal affect. Dialysis Access: L UE AVF +bruit healing incisions from DRIL procedure   Labs: Basic Metabolic Panel:  Recent Labs Lab 05/14/16 1635 05/18/16 2222 05/19/16 0801  NA 139 134* 134*  K 4.3 4.3 4.6  CL 105 96* 98*  CO2 25 25 26   GLUCOSE 153* 176* 110*  BUN 46* 40* 45*  CREATININE 5.62* 4.83* 5.06*  CALCIUM 9.1 9.8 9.4   Liver Function Tests: No results for input(s): AST, ALT, ALKPHOS, BILITOT, PROT, ALBUMIN in the last 168 hours. No results for input(s): LIPASE, AMYLASE in the last 168 hours. No results for input(s): AMMONIA in the last 168 hours. CBC:  Recent Labs Lab 05/14/16 1635 05/18/16 2222 05/19/16 0801  WBC 11.3* 10.1 7.9  HGB 10.2* 10.0* 9.4*  HCT 31.9* 31.2* 29.2*  MCV 94.9 94.3 94.8  PLT 226 231 232   Cardiac Enzymes:  Recent Labs Lab 05/19/16 0359 05/19/16 0801  TROPONINI 0.89* 3.13*   CBG:  Recent Labs Lab 05/14/16 1358 05/19/16 0759 05/19/16 1125  GLUCAP 119* 108* 109*    Iron Studies: No results for input(s): IRON, TIBC, TRANSFERRIN, FERRITIN in the last 72 hours. Studies/Results: Dg Chest Portable 1 View  Result Date: 05/18/2016 CLINICAL DATA:  Chest pain and shortness of breath. History of cancer, diabetes, hypertension. EXAM: PORTABLE CHEST 1 VIEW COMPARISON:  04/27/2014 FINDINGS: Postoperative changes in the mediastinum. Vascular grafts in the left axillary region. Shallow inspiration with linear atelectasis in the lung bases. No focal airspace disease or consolidation. No blunting of costophrenic angles. No pneumothorax. Calcified aorta.  IMPRESSION: Shallow inspiration with linear atelectasis in the lung bases. No consolidation or edema. Electronically Signed   By: Lucienne Capers M.D.   On: 05/18/2016 22:52    Dialysis Orders:  [place] EDW 64.5 kg. HD 4 hours Bath 3K 2.25 Ca Dialyzer F180, Access LUE AVF, heparin 3000 u bolus, BFR 350 Mircera 30 mcg q 4 weeks (last dose 5/10)  Hectorol 2 mcg q treatment  Assessment/Plan: 1. Chest pain  h/o CAD s/p CABG and multiple stenting procedures - per primary evaluated by cards and planing repeat coronary evaluation per notes  2.  ESRD -  TTS For HD today on schedule K 4.6  3.  Hypertension/volume  - BP controlled continue home meds/ volume stable UF to EDW  4.  Anemia  - Hgb 9.4 Follow esa dosed 5/10  5.  Metabolic bone disease -  Cont VDRA/Aurxyia binder  6.  Nutrition - Renal diet/vitamins   Lynnda Child PA-C [facility] Pager [phone] 05/19/2016, 11:57 AM  I have seen and examined this patient and agree with plan per Larina Earthly.  77yo with ESRD on HD [facility] TTS with last tx thurs.  Admitted now with CP and NSTEMI with plans for Ht cath on Mon.  Denies CP at present.  Add PO4 to labs.  Plan HD today and will pull to DW 64.5kg. Mahira Petras T,MD 05/19/2016 12:54 PM

## 2016-05-19 NOTE — ED Notes (Signed)
Pt states CP is coming back, titrating nitro

## 2016-05-19 NOTE — Progress Notes (Signed)
ANTICOAGULATION CONSULT NOTE - Initial Consult  Pharmacy Consult for Heparin  Indication: chest pain/ACS  Allergies  Allergen Reactions  . Brilinta [Ticagrelor] Shortness Of Breath  . Shellfish Allergy Other (See Comments)    Causes gout flare-ups    Patient Measurements: Height: 5\' 5"  (165.1 cm) Weight: 146 lb 4.8 oz (66.4 kg) IBW/kg (Calculated) : 61.5  Vital Signs: Temp: 98.5 F (36.9 C) (05/12 0258) Temp Source: Oral (05/12 0258) BP: 151/61 (05/12 0258) Pulse Rate: 71 (05/12 0258)  Labs:  Recent Labs  05/18/16 2222 05/19/16 0359 05/19/16 0801 05/19/16 1026  HGB 10.0*  --  9.4*  --   HCT 31.2*  --  29.2*  --   PLT 231  --  232  --   HEPARINUNFRC  --   --   --  0.31  CREATININE 4.83*  --  5.06*  --   TROPONINI  --  0.89* 3.13*  --     Estimated Creatinine Clearance: 11.1 mL/min (A) (by C-G formula based on SCr of 5.06 mg/dL (H)).   Medical History: Past Medical History:  Diagnosis Date  . Anemia    Likely secondary to history of GI bleed  . Anginal pain (HCC)    Chronic stable  . Anxiety   . Arthritis   . BPH (benign prostatic hyperplasia)   . CAD (coronary artery disease) of bypass graft 2006, 2012   In 2006: Occluded SVG-OM noted (SVG-diagonal was previously occluded); 2012: Severe lesion in redo SVG-OM1 --> BMS PCI   . CAD in native artery; and the grafts 1992, 1997, 2002, 2006, 2012   CABG 1992 and redo CABG 2006  . CAD S/P percutaneous coronary angioplasty October 2012; Feb, Apr & Oct 2015; Feb 2016   a) s/p CABG 1992 and redo 2006. b) 10/12: NSTEMI: PCI to SVG-OM1 Integrity BMS 3.5 x 15 (3.85 mm) c) 2/15: UA - PCI mid RCA Xience Ap DES 2.75 x 15 (3.1 mm). d) 4/15 : UA - focal dRCA Resolute DES 2.25 x 8 (2.5 mm). e) 10/15: PCI to mRCA ISR w/ post stent lesion - Promus P 2.75 x 16 (3.25 mm). f) 2/16: NSTEMI: CBA/PTCA of  mRCA ISR (3.5 mm post-dilation); g) 7/16 ISR RCA->CBA/PTCA, residual 20%.  . Carotid arterial disease ([place])    a) Duplex 05/2012: R  BULB/PROX ICA: 50-69%, L BULB/PROX ICA: 0-49%. ;; 04/29/2014: 78-29% RICA, 56-21% LICA. Patetn vertebrals,  . Chronic low back pain   . Colon cancer [facility]) 2003   colectomy for CA. no recurrence . never required  radiation or chem  . Dyspnea    "when I get too much Fluid"  . End stage renal disease on dialysis [facility])    Dialyzes at [facility]: Tues/Th/Sat - left upper cavity AV fistula brachiocephalic  . Gout   . Heart murmur   . History of hiatal hernia   . History of Non-ST elevated myocardial infarction (non-STEMI) 1992, 2006, 2012; 02/2013  . Hyperlipidemia   . Hypertension   . Hypothyroidism    On supplementation  . Ischemic cardiomyopathy    a) EF 45-50% by echo 2015. b) EF 50% by cath 04/2014.  Marland Kitchen LBBB (left bundle branch block)    Chronic  . Peptic ulcer disease  2004  . Plavix resistance    a) P2Y12 264 in 02/2014 - put on Brilinta but did not tolerate due to SOB. b) cannot be on Effient due to history of stroke. c) Plavix increased to 75mg  BID and Pletal added  04/2014 due to ISR.  Marland Kitchen Pneumonia   . Skin cancer 02/1999   nose  . Stroke [facility]) 1998    Assessment: 76 y/o M presents to the ED with CP, starting heparin per pharmacy. Of note, patient is ESRD on HD. Heparin level therapeutic at 0.31. Will dose increase slightly, to keep in goal range. CBC low/stable.   Goal of Therapy:  Heparin level 0.3-0.7 units/ml Monitor platelets by anticoagulation protocol: Yes   Plan:  Increase heparin drip to 850 units/hr Daily CBC/HL Monitor for bleeding F/u cards plan   Argie Ramming, PharmD Pharmacy Resident  Pager [phone] 05/19/16 11:18 AM

## 2016-05-20 LAB — CBC
HCT: 31.7 % — ABNORMAL LOW (ref 39.0–52.0)
Hemoglobin: 10.7 g/dL — ABNORMAL LOW (ref 13.0–17.0)
MCH: 31.8 pg (ref 26.0–34.0)
MCHC: 33.8 g/dL (ref 30.0–36.0)
MCV: 94.1 fL (ref 78.0–100.0)
Platelets: 235 10*3/uL (ref 150–400)
RBC: 3.37 MIL/uL — ABNORMAL LOW (ref 4.22–5.81)
RDW: 14.7 % (ref 11.5–15.5)
WBC: 7.1 10*3/uL (ref 4.0–10.5)

## 2016-05-20 LAB — HEPARIN LEVEL (UNFRACTIONATED)
HEPARIN UNFRACTIONATED: 0.25 [IU]/mL — AB (ref 0.30–0.70)
Heparin Unfractionated: 0.25 IU/mL — ABNORMAL LOW (ref 0.30–0.70)

## 2016-05-20 LAB — GLUCOSE, CAPILLARY
GLUCOSE-CAPILLARY: 108 mg/dL — AB (ref 65–99)
Glucose-Capillary: 126 mg/dL — ABNORMAL HIGH (ref 65–99)
Glucose-Capillary: 124 mg/dL — ABNORMAL HIGH (ref 65–99)

## 2016-05-20 MED ORDER — ACETAMINOPHEN 500 MG PO TABS
1000.0000 mg | ORAL_TABLET | Freq: Four times a day (QID) | ORAL | Status: DC | PRN
  Administered 2016-05-20 – 2016-05-21 (×2): 1000 mg via ORAL
  Filled 2016-05-20 (×2): qty 2

## 2016-05-20 NOTE — Progress Notes (Signed)
[place] for Heparin  Indication: chest pain/ACS  Allergies  Allergen Reactions  . Brilinta [Ticagrelor] Shortness Of Breath  . Shellfish Allergy Other (See Comments)    Causes gout flare-ups    Patient Measurements: Height: 5\' 5"  (165.1 cm) Weight: 141 lb 12.8 oz (64.3 kg) IBW/kg (Calculated) : 61.5  Vital Signs: Temp: 98.1 F (36.7 C) (05/13 1450) Temp Source: Oral (05/13 1450) BP: 106/62 (05/13 1450) Pulse Rate: 72 (05/13 1450)  Labs:  Recent Labs  05/18/16 2222 05/19/16 0359 05/19/16 0801 05/19/16 1026 05/19/16 2059 05/20/16 0912 05/20/16 1802  HGB 10.0*  --  9.4*  --   --  10.7*  --   HCT 31.2*  --  29.2*  --   --  31.7*  --   PLT 231  --  232  --   --  235  --   HEPARINUNFRC  --   --   --  0.31  --  0.25* 0.25*  CREATININE 4.83*  --  5.06*  --   --   --   --   TROPONINI  --  0.89* 3.13*  --  5.75*  --   --     Estimated Creatinine Clearance: 11.1 mL/min (A) (by C-G formula based on SCr of 5.06 mg/dL (H)).   Assessment: 76 y/o M presents to the ED with CP, starting heparin per pharmacy. Of note, patient is ESRD on HD. Heparin level remains subtherapeutic and unchanged at 0.25units/mL. Confirmed with RN Gwen that heparin has been running at ordered rate of 950 units/hr and no issues with line. No bleeding noted.  Goal of Therapy:  Heparin level 0.3-0.7 units/ml Monitor platelets by anticoagulation protocol: Yes   Plan:  Increase heparin drip to 1050 units/hr Daily CBC and heparin level  Monitor for bleeding F/u cards plan   Hasini Peachey D. Antaeus Karel, PharmD, BCPS Clinical Pharmacist Pager: [phone] 05/20/2016 6:33 PM

## 2016-05-20 NOTE — Progress Notes (Signed)
Subjective:  Tolerated dialysis well yesterday without issues.  No recurrence of chest pain today.  Troponin has continued to climb.  Objective:  Vital Signs in the last 24 hours: BP 112/61   Pulse 74   Temp 98.2 F (36.8 C)   Resp 15   Ht 5\' 5"  (1.651 m)   Wt 64.3 kg (141 lb 12.8 oz)   SpO2 97%   BMI 23.60 kg/m   Physical Exam: Pleasant male in no acute distress Lungs:  Clear Cardiac:  Regular rhythm, normal S1 and S2, no S3 Abdomen:  Soft, nontender, no masses Extremities: Dialysis fistula present in the left upper extremity  Intake/Output from previous day: 05/12 0701 - 05/13 0700 In: 486 [P.O.:240; I.V.:246] Out: 2700 [Urine:400]  Weight Filed Weights   05/19/16 1436 05/19/16 1839 05/20/16 0353  Weight: 66.8 kg (147 lb 4.3 oz) 64.9 kg (143 lb 1.3 oz) 64.3 kg (141 lb 12.8 oz)    Lab Results: Basic Metabolic Panel:  Recent Labs  05/18/16 2222 05/19/16 0801  NA 134* 134*  K 4.3 4.6  CL 96* 98*  CO2 25 26  GLUCOSE 176* 110*  BUN 40* 45*  CREATININE 4.83* 5.06*   CBC:  Recent Labs  05/19/16 0801 05/20/16 0912  WBC 7.9 7.1  HGB 9.4* 10.7*  HCT 29.2* 31.7*  MCV 94.8 94.1  PLT 232 235   Cardiac Enzymes: Troponin (Point of Care Test)  Recent Labs  05/18/16 2249  TROPIPOC 0.01   Cardiac Panel (last 3 results)  Recent Labs  05/19/16 0359 05/19/16 0801 05/19/16 2059  TROPONINI 0.89* 3.13* 5.75*    Telemetry: Sinus rhythm, personally reviewed by me  Assessment/Plan:  1.  Non-STEMI 2.  Severe CAD with previous redo bypass grafting and multiple stenting procedures and PCI of the vein graft disease and native coronary artery 3.  End-stage renal disease on dialysis  Recommendations:  Cardiac catheterization was discussed with the patient fully including risks of myocardial infarction, death, stroke, bleeding, arrhythmia, dye allergy, renal insufficiency or bleeding.  The patient understands and is willing to proceed.  Possibility of  intervention at the same time also discussed with patient and they understand and are agreeable to proceed      W. Doristine Church  MD [facility]  05/20/2016, 10:59 AM

## 2016-05-20 NOTE — Progress Notes (Signed)
Report received via Technical brewer format, reviewed tests, labs, VS, meds, PMH and patient's general condition, will assume care of patient when he returns from Dialysis.

## 2016-05-20 NOTE — Progress Notes (Signed)
  [facility] Progress Note   Assessment/ Plan:   1. Chest pain  h/o CAD s/p CABG and multiple stenting procedures - Management per primary. Per notes, likely repeat coronary evaluation tomorrow. Continuing heparin and IV nitro in meantime.  2.  ESRD -  TTS. Last HD yesterday. K WNL at 4.6 (05/12). Phos decreased at 1.5 (05/12).  2.3L UF yesterday with additional 460mL urine output. Net -2.1L since admission.  3.  Hypertension/volume  - At EDW today. Slightly hypotensive overnight,with lowest BP 89/52.  4.  Anemia  - Hgb 9.4 (05/12) following ESA dosed 5/10  5.  Metabolic bone disease -  Cont VDRA/Aurxyia binder  6.  Nutrition - Renal diet/vitamins   Subjective:   Patient reports chest pain is under control this AM. Says he is feeling well and has no complaints.    Objective:   BP 112/61   Pulse 74   Temp 98.2 F (36.8 C)   Resp 15   Ht 5\' 5"  (1.651 m)   Wt 64.3 kg (141 lb 12.8 oz)   SpO2 97%   BMI 23.60 kg/m   Physical Exam: GDJ:MEQASTMH gentleman, lying in bed in NAD CVS:RRR, no murmurs appreciated Resp:CTAB, normal WOB on RA, no wheezes or crackles DQQ:IWLN, non-tender, non-distended, +BS Ext:No LE edema Dialysis Access: LUE AVF, bruit noted, well-healing incisions from recent DRIL procedure without active bleeding or signs of infection  Dialysis Orders:  [place] [facility] 64.5 kg. HD 4 hoursBath 3K 2.25 CaDialyzer F180, Access LUE AVF, heparin 3000U bolus, BFR 350 Mircera 30 mcg q 4 weeks (last dose 5/10)  Hectorol 2 mcg q treatment  Labs: BMET  Recent Labs Lab 05/14/16 0743 05/14/16 1635 05/18/16 2222 05/19/16 0801 05/19/16 2059  NA 140 139 134* 134*  --   K 4.2 4.3 4.3 4.6  --   CL  --  105 96* 98*  --   CO2  --  25 25 26   --   GLUCOSE 131* 153* 176* 110*  --   BUN  --  46* 40* 45*  --   CREATININE  --  5.62* 4.83* 5.06*  --   CALCIUM  --  9.1 9.8 9.4  --   PHOS  --   --   --   --  1.5*   CBC  Recent Labs Lab 05/14/16 1635  05/18/16 2222 05/19/16 0801 05/20/16 0912  WBC 11.3* 10.1 7.9 7.1  HGB 10.2* 10.0* 9.4* 10.7*  HCT 31.9* 31.2* 29.2* 31.7*  MCV 94.9 94.3 94.8 94.1  PLT 226 231 232 235    Medications:    . amLODipine  2.5 mg Oral Once per day on Sun Mon Wed Fri  . atorvastatin  10 mg Oral QHS  . cilostazol  100 mg Oral BID  . clopidogrel  75 mg Oral Daily  . doxercalciferol  2 mcg Intravenous Q T,Th,Sa-HD  . ferric citrate  420 mg Oral TID WC  . levothyroxine  125 mcg Oral QAC breakfast  . metoprolol succinate  25 mg Oral QHS  . multivitamin  1 tablet Oral QHS  . pantoprazole  40 mg Oral Daily    Adin Hector, MD PGY-2 05/20/2016, 9:57 AM

## 2016-05-20 NOTE — Progress Notes (Signed)
ANTICOAGULATION CONSULT NOTE - Initial Consult  Pharmacy Consult for Heparin  Indication: chest pain/ACS  Allergies  Allergen Reactions  . Brilinta [Ticagrelor] Shortness Of Breath  . Shellfish Allergy Other (See Comments)    Causes gout flare-ups    Patient Measurements: Height: 5\' 5"  (165.1 cm) Weight: 141 lb 12.8 oz (64.3 kg) IBW/kg (Calculated) : 61.5  Vital Signs: Temp: 98.2 F (36.8 C) (05/13 0353) BP: 112/61 (05/13 0643) Pulse Rate: 74 (05/13 0643)  Labs:  Recent Labs  05/18/16 2222 05/19/16 0359 05/19/16 0801 05/19/16 1026 05/19/16 2059 05/20/16 0912  HGB 10.0*  --  9.4*  --   --  10.7*  HCT 31.2*  --  29.2*  --   --  31.7*  PLT 231  --  232  --   --  235  HEPARINUNFRC  --   --   --  0.31  --  0.25*  CREATININE 4.83*  --  5.06*  --   --   --   TROPONINI  --  0.89* 3.13*  --  5.75*  --     Estimated Creatinine Clearance: 11.1 mL/min (A) (by C-G formula based on SCr of 5.06 mg/dL (H)).   Medical History: Past Medical History:  Diagnosis Date  . Anemia    Likely secondary to history of GI bleed  . Anginal pain (HCC)    Chronic stable  . Anxiety   . Arthritis   . BPH (benign prostatic hyperplasia)   . CAD (coronary artery disease) of bypass graft 2006, 2012   In 2006: Occluded SVG-OM noted (SVG-diagonal was previously occluded); 2012: Severe lesion in redo SVG-OM1 --> BMS PCI   . CAD in native artery; and the grafts 1992, 1997, 2002, 2006, 2012   CABG 1992 and redo CABG 2006  . CAD S/P percutaneous coronary angioplasty October 2012; Feb, Apr & Oct 2015; Feb 2016   a) s/p CABG 1992 and redo 2006. b) 10/12: NSTEMI: PCI to SVG-OM1 Integrity BMS 3.5 x 15 (3.85 mm) c) 2/15: UA - PCI mid RCA Xience Ap DES 2.75 x 15 (3.1 mm). d) 4/15 : UA - focal dRCA Resolute DES 2.25 x 8 (2.5 mm). e) 10/15: PCI to mRCA ISR w/ post stent lesion - Promus P 2.75 x 16 (3.25 mm). f) 2/16: NSTEMI: CBA/PTCA of  mRCA ISR (3.5 mm post-dilation); g) 7/16 ISR RCA->CBA/PTCA, residual  20%.  . Carotid arterial disease ([place])    a) Duplex 05/2012: R BULB/PROX ICA: 50-69%, L BULB/PROX ICA: 0-49%. ;; 04/29/2014: 42-70% RICA, 62-37% LICA. Patetn vertebrals,  . Chronic low back pain   . Colon cancer [facility]) 2003   colectomy for CA. no recurrence . never required  radiation or chem  . Dyspnea    "when I get too much Fluid"  . End stage renal disease on dialysis [facility])    D[facility]yzes at [facility]: Tues/Th/Sat - left upper cavity AV fistula brachiocephalic  . Gout   . Heart murmur   . History of hiatal hernia   . History of Non-ST elevated myocardial infarction (non-STEMI) 1992, 2006, 2012; 02/2013  . Hyperlipidemia   . Hypertension   . Hypothyroidism    On supplementation  . Ischemic cardiomyopathy    a) EF 45-50% by echo 2015. b) EF 50% by cath 04/2014.  Marland Kitchen LBBB (left bundle branch block)    Chronic  . Peptic ulcer disease  2004  . Plavix resistance    a) P2Y12 264 in 02/2014 - put on Brilinta but  did not tolerate due to SOB. b) cannot be on Effient due to history of stroke. c) Plavix increased to 75mg  BID and Pletal added 04/2014 due to ISR.  Marland Kitchen Pneumonia   . Skin cancer 02/1999   nose  . Stroke [facility]) 1998    Assessment: 76 y/o M presents to the ED with CP, starting heparin per pharmacy. Of note, patient is ESRD on HD. Heparin level subtherapeutic at 0.25. CBC low/stable.   Goal of Therapy:  Heparin level 0.3-0.7 units/ml Monitor platelets by anticoagulation protocol: Yes   Plan:  Increase heparin drip to 950 units/hr Heparin level in 8 hours Daily CBC and heparin level  Monitor for bleeding F/u cards plan   Argie Ramming, PharmD Pharmacy Resident  Pager [phone] 05/20/16 9:53 AM

## 2016-05-20 NOTE — Progress Notes (Signed)
Pt c/o 5/10 chest pain, nitro rate increased to 10 ml/hr, with partial relief, BP WNL, will continue to monitor closely.  Edward Qualia RN

## 2016-05-20 NOTE — Plan of Care (Signed)
Problem: Safety: Goal: Ability to remain free from injury will improve Outcome: Completed/Met Date Met: 05/20/16 Patient is able to use the call light appropriately and calls on the phone if needed ASAP. All personal items are within reach on his bedside table.

## 2016-05-20 NOTE — Progress Notes (Signed)
Patient is on NTG drip at 60 mcgs for chest pain and had 2.3 liters of fluid pulled from left upper arm fistula without complications but will try to titrate NTG drip down since B/P is now on the low side and patient is chest pain free, will titrate slowly as tolerated by the patient and will continue to monitor.

## 2016-05-21 ENCOUNTER — Encounter (HOSPITAL_COMMUNITY)
Admission: EM | Disposition: A | Discharge status: Home / Self Care | Payer: Self-pay | Source: Home / Self Care | Attending: Internal Medicine

## 2016-05-21 ENCOUNTER — Encounter (HOSPITAL_COMMUNITY): Payer: Self-pay | Admitting: General Practice

## 2016-05-21 DIAGNOSIS — I25110 Atherosclerotic heart disease of native coronary artery with unstable angina pectoris: Secondary | ICD-10-CM

## 2016-05-21 DIAGNOSIS — I214 Non-ST elevation (NSTEMI) myocardial infarction: Secondary | ICD-10-CM

## 2016-05-21 DIAGNOSIS — I208 Other forms of angina pectoris: Secondary | ICD-10-CM

## 2016-05-21 DIAGNOSIS — I2510 Atherosclerotic heart disease of native coronary artery without angina pectoris: Secondary | ICD-10-CM

## 2016-05-21 HISTORY — PX: CORONARY BALLOON ANGIOPLASTY: CATH118233

## 2016-05-21 HISTORY — PX: LEFT HEART CATH AND CORS/GRAFTS ANGIOGRAPHY: CATH118250

## 2016-05-21 HISTORY — PX: CORONARY ATHERECTOMY: CATH118238

## 2016-05-21 LAB — BASIC METABOLIC PANEL
Anion gap: 11 (ref 5–15)
BUN: 37 mg/dL — AB (ref 6–20)
CALCIUM: 9.9 mg/dL (ref 8.9–10.3)
CO2: 27 mmol/L (ref 22–32)
CREATININE: 4.75 mg/dL — AB (ref 0.61–1.24)
Chloride: 95 mmol/L — ABNORMAL LOW (ref 101–111)
GFR calc Af Amer: 13 mL/min — ABNORMAL LOW (ref >60)
GFR calc non Af Amer: 11 mL/min — ABNORMAL LOW (ref >60)
GLUCOSE: 110 mg/dL — AB (ref 65–99)
POTASSIUM: 4.4 mmol/L (ref 3.5–5.1)
SODIUM: 133 mmol/L — AB (ref 135–145)

## 2016-05-21 LAB — CBC
HCT: 31.5 % — ABNORMAL LOW (ref 39.0–52.0)
Hemoglobin: 10.5 g/dL — ABNORMAL LOW (ref 13.0–17.0)
MCH: 31.3 pg (ref 26.0–34.0)
MCHC: 33.3 g/dL (ref 30.0–36.0)
MCV: 94 fL (ref 78.0–100.0)
PLATELETS: 245 10*3/uL (ref 150–400)
RBC: 3.35 MIL/uL — AB (ref 4.22–5.81)
RDW: 14.7 % (ref 11.5–15.5)
WBC: 6.2 10*3/uL (ref 4.0–10.5)

## 2016-05-21 LAB — POCT ACTIVATED CLOTTING TIME
ACTIVATED CLOTTING TIME: 318 s
Activated Clotting Time: 158 seconds

## 2016-05-21 LAB — PROTIME-INR
INR: 0.96
PROTHROMBIN TIME: 12.8 s (ref 11.4–15.2)

## 2016-05-21 LAB — HEPARIN LEVEL (UNFRACTIONATED)
Heparin Unfractionated: 0.38 IU/mL (ref 0.30–0.70)
Heparin Unfractionated: 0.32 IU/mL (ref 0.30–0.70)

## 2016-05-21 SURGERY — LEFT HEART CATH AND CORS/GRAFTS ANGIOGRAPHY
Anesthesia: LOCAL

## 2016-05-21 MED ORDER — SODIUM CHLORIDE 0.9% FLUSH: 3.0000 mL | INTRAVENOUS | Status: DC | PRN

## 2016-05-21 MED ORDER — IOPAMIDOL (ISOVUE-370) INJECTION 76%
INTRAVENOUS | Status: DC | PRN
  Administered 2016-05-21: 150 mL via INTRA_ARTERIAL

## 2016-05-21 MED ORDER — MIDAZOLAM HCL 2 MG/2ML IJ SOLN
INTRAMUSCULAR | Status: DC | PRN
  Administered 2016-05-21: 1 mg via INTRAVENOUS

## 2016-05-21 MED ORDER — HEPARIN (PORCINE) IN NACL 2-0.9 UNIT/ML-% IJ SOLN
INTRAMUSCULAR | Status: AC
  Filled 2016-05-21: qty 1000

## 2016-05-21 MED ORDER — BIVALIRUDIN TRIFLUOROACETATE 250 MG IV SOLR
INTRAVENOUS | Status: AC
  Filled 2016-05-21: qty 250

## 2016-05-21 MED ORDER — MIDAZOLAM HCL 2 MG/2ML IJ SOLN
INTRAMUSCULAR | Status: AC
  Filled 2016-05-21: qty 2

## 2016-05-21 MED ORDER — FENTANYL CITRATE (PF) 100 MCG/2ML IJ SOLN
INTRAMUSCULAR | Status: DC | PRN
  Administered 2016-05-21 (×2): 25 ug via INTRAVENOUS

## 2016-05-21 MED ORDER — HYDRALAZINE HCL 20 MG/ML IJ SOLN: 5.0000 mg | INTRAMUSCULAR | Status: AC | PRN

## 2016-05-21 MED ORDER — IOPAMIDOL (ISOVUE-370) INJECTION 76%
INTRAVENOUS | Status: AC
  Filled 2016-05-21: qty 100

## 2016-05-21 MED ORDER — ASPIRIN 81 MG PO CHEW: 81.0000 mg | CHEWABLE_TABLET | ORAL | Status: DC

## 2016-05-21 MED ORDER — SODIUM CHLORIDE 0.9% FLUSH: 3.0000 mL | Freq: Two times a day (BID) | INTRAVENOUS | Status: DC

## 2016-05-21 MED ORDER — ONDANSETRON HCL 4 MG/2ML IJ SOLN: 4.0000 mg | Freq: Four times a day (QID) | INTRAMUSCULAR | Status: DC | PRN

## 2016-05-21 MED ORDER — ASPIRIN 81 MG PO CHEW
81.0000 mg | CHEWABLE_TABLET | Freq: Every day | ORAL | Status: DC
  Administered 2016-05-22: 16:00:00 81 mg via ORAL
  Filled 2016-05-21: qty 1

## 2016-05-21 MED ORDER — IOPAMIDOL (ISOVUE-370) INJECTION 76%
INTRAVENOUS | Status: AC
  Filled 2016-05-21: qty 125

## 2016-05-21 MED ORDER — SODIUM CHLORIDE 0.9% FLUSH
3.0000 mL | Freq: Two times a day (BID) | INTRAVENOUS | Status: DC
  Administered 2016-05-21: 3 mL via INTRAVENOUS

## 2016-05-21 MED ORDER — LABETALOL HCL 5 MG/ML IV SOLN: 10.0000 mg | INTRAVENOUS | Status: AC | PRN

## 2016-05-21 MED ORDER — HEPARIN (PORCINE) IN NACL 2-0.9 UNIT/ML-% IJ SOLN
INTRAMUSCULAR | Status: AC | PRN
  Administered 2016-05-21: 1000 mL via INTRA_ARTERIAL

## 2016-05-21 MED ORDER — MORPHINE SULFATE (PF) 4 MG/ML IV SOLN: 2.0000 mg | INTRAVENOUS | Status: DC | PRN

## 2016-05-21 MED ORDER — SODIUM CHLORIDE 0.9 % IV SOLN: INTRAVENOUS | Status: DC

## 2016-05-21 MED ORDER — FENTANYL CITRATE (PF) 100 MCG/2ML IJ SOLN
INTRAMUSCULAR | Status: AC
  Filled 2016-05-21: qty 2

## 2016-05-21 MED ORDER — ACETAMINOPHEN 325 MG PO TABS
650.0000 mg | ORAL_TABLET | ORAL | Status: DC | PRN
  Filled 2016-05-21: qty 2

## 2016-05-21 MED ORDER — HYDRALAZINE HCL 20 MG/ML IJ SOLN: 10.0000 mg | INTRAMUSCULAR | Status: DC | PRN

## 2016-05-21 MED ORDER — SODIUM CHLORIDE 0.9 % IV SOLN: 250.0000 mL | INTRAVENOUS | Status: DC | PRN

## 2016-05-21 MED ORDER — BIVALIRUDIN BOLUS VIA INFUSION - CUPID
INTRAVENOUS | Status: DC | PRN
  Administered 2016-05-21: 48.75 mg via INTRAVENOUS

## 2016-05-21 MED ORDER — SODIUM CHLORIDE 0.9 % IV SOLN
INTRAVENOUS | Status: DC | PRN
  Administered 2016-05-21: 0.25 mg/kg/h via INTRAVENOUS

## 2016-05-21 MED ORDER — CLOPIDOGREL BISULFATE 75 MG PO TABS
75.0000 mg | ORAL_TABLET | Freq: Every day | ORAL | Status: DC
Start: 2016-05-22 — End: 2016-05-21

## 2016-05-21 MED ORDER — LIDOCAINE HCL (PF) 1 % IJ SOLN
INTRAMUSCULAR | Status: AC
  Filled 2016-05-21: qty 30

## 2016-05-21 MED ORDER — LIDOCAINE HCL (PF) 1 % IJ SOLN
INTRAMUSCULAR | Status: DC | PRN
  Administered 2016-05-21: 20 mL

## 2016-05-21 MED ORDER — ASPIRIN 81 MG PO CHEW
81.0000 mg | CHEWABLE_TABLET | ORAL | Status: AC
  Administered 2016-05-21: 81 mg via ORAL
  Filled 2016-05-21: qty 1

## 2016-05-21 SURGICAL SUPPLY — 20 items
BALLN ANGIOSCULPT RX 3.5X10 (BALLOONS) ×2
BALLN EMERGE MR 2.5X12 (BALLOONS) ×2
BALLOON ANGIOSCULPT RX 3.5X10 (BALLOONS) IMPLANT
BALLOON EMERGE MR 2.5X12 (BALLOONS) IMPLANT
CATH INFINITI 5 FR IM (CATHETERS) ×1 IMPLANT
CATH INFINITI 5FR ANG PIGTAIL (CATHETERS) ×2 IMPLANT
CATH INFINITI 5FR JL4 (CATHETERS) ×1 IMPLANT
CATH INFINITI JR4 5F (CATHETERS) ×1 IMPLANT
GUIDELINER 6F (CATHETERS) ×1 IMPLANT
KIT ENCORE 26 ADVANTAGE (KITS) ×1 IMPLANT
KIT HEART LEFT (KITS) ×2 IMPLANT
PACK CARDIAC CATHETERIZATION (CUSTOM PROCEDURE TRAY) ×2 IMPLANT
SHEATH PINNACLE 5F 10CM (SHEATH) ×1 IMPLANT
SHEATH PINNACLE 6F 10CM (SHEATH) ×1 IMPLANT
SYR MEDRAD MARK V 150ML (SYRINGE) ×2 IMPLANT
TRANSDUCER W/STOPCOCK (MISCELLANEOUS) ×2 IMPLANT
TUBING CIL FLEX 10 FLL-RA (TUBING) ×2 IMPLANT
WIRE ASAHI PROWATER 180CM (WIRE) ×1 IMPLANT
WIRE EMERALD 3MM-J .035X150CM (WIRE) ×1 IMPLANT
WIRE EMERALD 3MM-J .035X260CM (WIRE) ×1 IMPLANT

## 2016-05-21 NOTE — Progress Notes (Signed)
ANTICOAGULATION CONSULT NOTE - Follow Up Consult  Pharmacy Consult for Heparin  Indication: chest pain/ACS  Allergies  Allergen Reactions  . Brilinta [Ticagrelor] Shortness Of Breath  . Shellfish Allergy Other (See Comments)    Causes gout flare-ups    Patient Measurements: Height: 5\' 5"  (165.1 cm) Weight: 143 lb 3.2 oz (65 kg) IBW/kg (Calculated) : 61.5  Vital Signs: Temp: 98.7 F (37.1 C) (05/14 0520) BP: 107/52 (05/14 1120) Pulse Rate: 63 (05/14 1120)  Labs:  Recent Labs  05/18/16 2222 05/19/16 0359 05/19/16 0801  05/19/16 2059 05/20/16 0912 05/20/16 1802 05/21/16 0537 05/21/16 1123  HGB 10.0*  --  9.4*  --   --  10.7*  --  10.5*  --   HCT 31.2*  --  29.2*  --   --  31.7*  --  31.5*  --   PLT 231  --  232  --   --  235  --  245  --   LABPROT  --   --   --   --   --   --   --   --  12.8  INR  --   --   --   --   --   --   --   --  0.96  HEPARINUNFRC  --   --   --   < >  --  0.25* 0.25* 0.38 0.32  CREATININE 4.83*  --  5.06*  --   --   --   --   --  4.75*  TROPONINI  --  0.89* 3.13*  --  5.75*  --   --   --   --   < > = values in this interval not displayed.  Estimated Creatinine Clearance: 11.9 mL/min (A) (by C-G formula based on SCr of 4.75 mg/dL (H)).  Assessment: 76 y/o M presents to the ED with CP and was started on heparin per pharmacy. Of note, patient is ESRD on HD. Plan is for cath today.  Heparin level is therapeutic at 0.32 on 1050 units/hr. No bleeding noted, CBC is low but stable.  Goal of Therapy:  Heparin level 0.3-0.7 units/ml Monitor platelets by anticoagulation protocol: Yes   Plan:  - Continue heparin drip at 1050 units/hr - Daily heparin level and CBC - Monitor for s/sx of bleeding - F/u after cath   Renold Genta, PharmD, BCPS Clinical Pharmacist Phone for today - [place] - [phone] 05/21/2016 1:42 PM

## 2016-05-21 NOTE — Progress Notes (Signed)
  [facility] Progress Note   Subjective: no c/o, awaiting heart cath proc  Vitals:   05/21/16 0315 05/21/16 0445 05/21/16 0515 05/21/16 0520  BP: (!) 123/54 (!) 139/58 (!) 127/54 (!) 120/41  Pulse: 69 71 70 68  Resp: 14 10 13 13   Temp:    98.7 F (37.1 C)  TempSrc:      SpO2: 97% 100% 97% 96%  Weight:    65 kg (143 lb 3.2 oz)  Height:        Inpatient medications: . amLODipine  2.5 mg Oral Once per day on Sun Mon Wed Fri  . atorvastatin  10 mg Oral QHS  . cilostazol  100 mg Oral BID  . clopidogrel  75 mg Oral Daily  . doxercalciferol  2 mcg Intravenous Q T,Th,Sa-HD  . ferric citrate  420 mg Oral TID WC  . levothyroxine  125 mcg Oral QAC breakfast  . metoprolol succinate  25 mg Oral QHS  . multivitamin  1 tablet Oral QHS  . pantoprazole  40 mg Oral Daily  . sodium chloride flush  3 mL Intravenous Q12H   . sodium chloride    . sodium chloride    . heparin 1,050 Units/hr (05/21/16 0448)  . nitroGLYCERIN 30 mcg/min (05/20/16 2100)   sodium chloride, acetaminophen, clonazePAM, Melatonin, morphine injection, nitroGLYCERIN, ondansetron (ZOFRAN) IV, sodium chloride flush  Exam: Alert, calm Chest clear bilat RRR no mrg No LE edema L arm wounds healing, +bruit LUA AVF NF, Ox3  Dialysis: [place] TTS 4h   64.5kg  3K/2.25 bath  LUA AVF  Hep 3000 - Mircera 30 every 4 wks last 5/10 - Hect 2 ug tiw      Assessment: 1  Chest pain, hx CABG and mult stenting procedures - for cath today 2  SP recent L arm DRIL surgery - for steal syndrome 3  ESRD tts HD 4  Vol is at dry wt 5  HTN cont meds  Plan - HD Tuesday, cath today   Kelly Splinter MD [facility] pager [phone]   05/21/2016, 10:37 AM    Recent Labs Lab 05/14/16 1635 05/18/16 2222 05/19/16 0801 05/19/16 2059  NA 139 134* 134*  --   K 4.3 4.3 4.6  --   CL 105 96* 98*  --   CO2 25 25 26   --   GLUCOSE 153* 176* 110*  --   BUN 46* 40* 45*  --   CREATININE 5.62* 4.83* 5.06*  --    CALCIUM 9.1 9.8 9.4  --   PHOS  --   --   --  1.5*   No results for input(s): AST, ALT, ALKPHOS, BILITOT, PROT, ALBUMIN in the last 168 hours.  Recent Labs Lab 05/19/16 0801 05/20/16 0912 05/21/16 0537  WBC 7.9 7.1 6.2  HGB 9.4* 10.7* 10.5*  HCT 29.2* 31.7* 31.5*  MCV 94.8 94.1 94.0  PLT 232 235 245   Iron/TIBC/Ferritin/ %Sat    Component Value Date/Time   IRON 15 (L) 10/13/2010 1500   TIBC 213 (L) 10/13/2010 1500   FERRITIN 116 10/13/2010 1500   IRONPCTSAT 7 (L) 10/13/2010 1500

## 2016-05-21 NOTE — Progress Notes (Signed)
Progress Note  Patient Name: [name] [name] Date of Encounter: 05/21/2016  Primary Cardiologist: Ellyn Hack  Subjective   No chest pain this morning. - "as long as the NTG gtt is running"  Inpatient Medications    Scheduled Meds: . amLODipine  2.5 mg Oral Once per day on Sun Mon Wed Fri  . atorvastatin  10 mg Oral QHS  . cilostazol  100 mg Oral BID  . clopidogrel  75 mg Oral Daily  . doxercalciferol  2 mcg Intravenous Q T,Th,Sa-HD  . ferric citrate  420 mg Oral TID WC  . levothyroxine  125 mcg Oral QAC breakfast  . metoprolol succinate  25 mg Oral QHS  . multivitamin  1 tablet Oral QHS  . pantoprazole  40 mg Oral Daily  . sodium chloride flush  3 mL Intravenous Q12H   Continuous Infusions: . sodium chloride    . sodium chloride    . heparin 1,050 Units/hr (05/21/16 0448)  . nitroGLYCERIN 30 mcg/min (05/20/16 2100)   PRN Meds: sodium chloride, acetaminophen, clonazePAM, Melatonin, morphine injection, nitroGLYCERIN, ondansetron (ZOFRAN) IV, sodium chloride flush   Vital Signs    Vitals:   05/21/16 0315 05/21/16 0445 05/21/16 0515 05/21/16 0520  BP: (!) 123/54 (!) 139/58 (!) 127/54 (!) 120/41  Pulse: 69 71 70 68  Resp: 14 10 13 13   Temp:    98.7 F (37.1 C)  TempSrc:      SpO2: 97% 100% 97% 96%  Weight:    143 lb 3.2 oz (65 kg)  Height:        Intake/Output Summary (Last 24 hours) at 05/21/16 1044 Last data filed at 05/21/16 0936  Gross per 24 hour  Intake           879.92 ml  Output              975 ml  Net           -95.08 ml   Filed Weights   05/19/16 1839 05/20/16 0353 05/21/16 0520  Weight: 143 lb 1.3 oz (64.9 kg) 141 lb 12.8 oz (64.3 kg) 143 lb 3.2 oz (65 kg)    Telemetry    SR - Personally Reviewed  ECG    SR with LBBB - Personally Reviewed  Physical Exam   General: Well developed, well nourished, male appearing in no acute distress. Head: Normocephalic, atraumatic.  Neck: Supple without bruits, JVD. Lungs:  Resp regular and unlabored,  CTA. Heart: RRR, S1, S2, no S3, S4, or murmur; no rub. Abdomen: Soft, non-tender, non-distended with normoactive bowel sounds. No hepatomegaly. No rebound/guarding. No obvious abdominal masses. Extremities: No clubbing, cyanosis, edema. Distal pedal pulses are 2+ bilaterally. LUE fistula.  Neuro: Alert and oriented X 3. Moves all extremities spontaneously. Psych: Normal affect.  Labs    Chemistry Recent Labs Lab 05/14/16 1635 05/18/16 2222 05/19/16 0801  NA 139 134* 134*  K 4.3 4.3 4.6  CL 105 96* 98*  CO2 25 25 26   GLUCOSE 153* 176* 110*  BUN 46* 40* 45*  CREATININE 5.62* 4.83* 5.06*  CALCIUM 9.1 9.8 9.4  GFRNONAA 9* 11* 10*  GFRAA 10* 12* 12*  ANIONGAP 9 13 10      Hematology Recent Labs Lab 05/19/16 0801 05/20/16 0912 05/21/16 0537  WBC 7.9 7.1 6.2  RBC 3.08* 3.37* 3.35*  HGB 9.4* 10.7* 10.5*  HCT 29.2* 31.7* 31.5*  MCV 94.8 94.1 94.0  MCH 30.5 31.8 31.3  MCHC 32.2 33.8 33.3  RDW 14.3 14.7  14.7  PLT 232 235 245    Cardiac Enzymes Recent Labs Lab 05/19/16 0359 05/19/16 0801 05/19/16 2059  TROPONINI 0.89* 3.13* 5.75*    Recent Labs Lab 05/18/16 2249  TROPIPOC 0.01     BNPNo results for input(s): BNP, PROBNP in the last 168 hours.   DDimer No results for input(s): DDIMER in the last 168 hours.    Radiology    No results found.  Cardiac Studies   N/A  Patient Profile     76 y.o. 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    Assessment & Plan    1. Chest pain: Trop peaked at 5.75. Remains on IV nitro/heparin. No chest pain at this time. Planned for cardiac cath today. EKG on admission showed SR with known LBBB with new T wave inversions compared to previous tracings.  -- on plavix, BB. Statin  He pretty much knows his symptoms and has been having progressively worsening angina requiring more frequent  nitroglycerin, however on Friday night nitroglycerin did not relieve his pain as it usually has. He therefore came to the emergency room.  Very difficult situation, I do agree he needs cardiac catheterization.  He is on chronic aspirin and Plavix as well as Pletal  On atorvastatin 20 mg which is as much as he can tolerate.  2. HTN: Stable with current therapy - he has persistent hypotension, and has had to have his carvedilol dose reduced in order to use low-dose amlodipine for an anginal effect. We have actually converted him to Toprol 25 mg +2.5 of amlodipine. I would not change his home regimen on discharge.  3. ESRD on HD: Nephrology following this admission. Plan for HD Tuesday.   Signed, Reino Bellis, NP  05/21/2016, 10:44 AM    I have seen, examined and evaluated the patient this PM along with Reino Bellis, NP.  After reviewing all the available data and chart, we discussed the patients laboratory, study & physical findings as well as symptoms in detail. I agree with her findings, examination as well as impression recommendations as per our discussion.   My input noted in [place] has been doing relatively well awaken as long as he remains on his nitroglycerin drip. When it was reduced or titrated down his pain went back up to 5 out of 10. He is currently resting comfortably. At baseline he has intermittent angina decubitus on his predialysis days but has been doing relatively well up until the past month or so. When I last saw him he was started using more nitroglycerin. The main reason why saw him was because of his hypotension however. I made adjustments and would continue his home medication on discharge.  His planned for cardiac catheter today, and I reviewed historian films with Dr. Gwenlyn Found will be performing the procedure. With a troponin elevation of > 5, I wonder if this is to be more visit restenosis of the RCA versus graft disease.  More plans per cath  report.    [name], M.D., M.S. Interventional Cardiologist   Pager # [phone] Phone # [phone] [address] [place], [place] [address]

## 2016-05-21 NOTE — Progress Notes (Signed)
ANTICOAGULATION CONSULT NOTE - Follow Up Consult  Pharmacy Consult for Heparin  Indication: chest pain/ACS  Allergies  Allergen Reactions  . Brilinta [Ticagrelor] Shortness Of Breath  . Shellfish Allergy Other (See Comments)    Causes gout flare-ups    Patient Measurements: Height: 5\' 5"  (165.1 cm) Weight: 143 lb 3.2 oz (65 kg) IBW/kg (Calculated) : 61.5  Vital Signs: Temp: 98.7 F (37.1 C) (05/14 0520) BP: 120/41 (05/14 0520) Pulse Rate: 68 (05/14 0520)  Labs:  Recent Labs  05/18/16 2222 05/19/16 0359 05/19/16 0801  05/19/16 2059 05/20/16 0912 05/20/16 1802 05/21/16 0537  HGB 10.0*  --  9.4*  --   --  10.7*  --  10.5*  HCT 31.2*  --  29.2*  --   --  31.7*  --  31.5*  PLT 231  --  232  --   --  235  --  245  HEPARINUNFRC  --   --   --   < >  --  0.25* 0.25* 0.38  CREATININE 4.83*  --  5.06*  --   --   --   --   --   TROPONINI  --  0.89* 3.13*  --  5.75*  --   --   --   < > = values in this interval not displayed.  Estimated Creatinine Clearance: 11.1 mL/min (A) (by C-G formula based on SCr of 5.06 mg/dL (H)).    Assessment: Heparin for CP, plans for cath, heparin level therapeutic after rate increase  Goal of Therapy:  Heparin level 0.3-0.7 units/ml Monitor platelets by anticoagulation protocol: Yes   Plan:  -Cont heparin 1050 units/hr -1300 HL  Narda Bonds 05/21/2016,7:27 AM

## 2016-05-21 NOTE — Interval H&P Note (Signed)
Cath Lab Visit (complete for each Cath Lab visit)  Clinical Evaluation Leading to the Procedure:   ACS: Yes.    Non-ACS:    Anginal Classification: CCS III  Anti-ischemic medical therapy: Minimal Therapy (1 class of medications)  Non-Invasive Test Results: No non-invasive testing performed  Prior CABG: Previous CABG      History and Physical Interval Note:  05/21/2016 3:35 PM  [name] [name]  has presented today for surgery, with the diagnosis of NSTEMI  The various methods of treatment have been discussed with the patient and family. After consideration of risks, benefits and other options for treatment, the patient has consented to  Procedure(s): Left Heart Cath and Coronary Angiography (N/A) as a surgical intervention .  The patient's history has been reviewed, patient examined, no change in status, stable for surgery.  I have reviewed the patient's chart and labs.  Questions were answered to the patient's satisfaction.     Quay Burow

## 2016-05-21 NOTE — H&P (View-Only) (Signed)
Subjective:  Tolerated dialysis well yesterday without issues.  No recurrence of chest pain today.  Troponin has continued to climb.  Objective:  Vital Signs in the last 24 hours: BP 112/61   Pulse 74   Temp 98.2 F (36.8 C)   Resp 15   Ht 5\' 5"  (1.651 m)   Wt 64.3 kg (141 lb 12.8 oz)   SpO2 97%   BMI 23.60 kg/m   Physical Exam: Pleasant male in no acute distress Lungs:  Clear Cardiac:  Regular rhythm, normal S1 and S2, no S3 Abdomen:  Soft, nontender, no masses Extremities: Dialysis fistula present in the left upper extremity  Intake/Output from previous day: 05/12 0701 - 05/13 0700 In: 486 [P.O.:240; I.V.:246] Out: 2700 [Urine:400]  Weight Filed Weights   05/19/16 1436 05/19/16 1839 05/20/16 0353  Weight: 66.8 kg (147 lb 4.3 oz) 64.9 kg (143 lb 1.3 oz) 64.3 kg (141 lb 12.8 oz)    Lab Results: Basic Metabolic Panel:  Recent Labs  05/18/16 2222 05/19/16 0801  NA 134* 134*  K 4.3 4.6  CL 96* 98*  CO2 25 26  GLUCOSE 176* 110*  BUN 40* 45*  CREATININE 4.83* 5.06*   CBC:  Recent Labs  05/19/16 0801 05/20/16 0912  WBC 7.9 7.1  HGB 9.4* 10.7*  HCT 29.2* 31.7*  MCV 94.8 94.1  PLT 232 235   Cardiac Enzymes: Troponin (Point of Care Test)  Recent Labs  05/18/16 2249  TROPIPOC 0.01   Cardiac Panel (last 3 results)  Recent Labs  05/19/16 0359 05/19/16 0801 05/19/16 2059  TROPONINI 0.89* 3.13* 5.75*    Telemetry: Sinus rhythm, personally reviewed by me  Assessment/Plan:  1.  Non-STEMI 2.  Severe CAD with previous redo bypass grafting and multiple stenting procedures and PCI of the vein graft disease and native coronary artery 3.  End-stage renal disease on dialysis  Recommendations:  Cardiac catheterization was discussed with the patient fully including risks of myocardial infarction, death, stroke, bleeding, arrhythmia, dye allergy, renal insufficiency or bleeding.  The patient understands and is willing to proceed.  Possibility of  intervention at the same time also discussed with patient and they understand and are agreeable to proceed      W. Doristine Church  MD [facility]  05/20/2016, 10:59 AM

## 2016-05-22 ENCOUNTER — Encounter (HOSPITAL_COMMUNITY): Payer: Self-pay | Admitting: Cardiovascular Disease

## 2016-05-22 ENCOUNTER — Telehealth: Payer: Self-pay | Admitting: Physician Assistant

## 2016-05-22 DIAGNOSIS — Z9861 Coronary angioplasty status: Secondary | ICD-10-CM

## 2016-05-22 LAB — BASIC METABOLIC PANEL
ANION GAP: 10 (ref 5–15)
BUN: 41 mg/dL — ABNORMAL HIGH (ref 6–20)
CHLORIDE: 96 mmol/L — AB (ref 101–111)
CO2: 28 mmol/L (ref 22–32)
Calcium: 9.6 mg/dL (ref 8.9–10.3)
Creatinine, Ser: 5.19 mg/dL — ABNORMAL HIGH (ref 0.61–1.24)
GFR calc Af Amer: 11 mL/min — ABNORMAL LOW (ref >60)
GFR, EST NON AFRICAN AMERICAN: 10 mL/min — AB (ref >60)
Glucose, Bld: 120 mg/dL — ABNORMAL HIGH (ref 65–99)
POTASSIUM: 3.9 mmol/L (ref 3.5–5.1)
Sodium: 134 mmol/L — ABNORMAL LOW (ref 135–145)

## 2016-05-22 LAB — CBC
HEMATOCRIT: 31 % — AB (ref 39.0–52.0)
HEMOGLOBIN: 10 g/dL — AB (ref 13.0–17.0)
MCH: 30.4 pg (ref 26.0–34.0)
MCHC: 32.3 g/dL (ref 30.0–36.0)
MCV: 94.2 fL (ref 78.0–100.0)
PLATELETS: 277 10*3/uL (ref 150–400)
RBC: 3.29 MIL/uL — AB (ref 4.22–5.81)
RDW: 14.5 % (ref 11.5–15.5)
WBC: 7.6 10*3/uL (ref 4.0–10.5)

## 2016-05-22 MED ORDER — SODIUM CHLORIDE 0.9 % IV SOLN: 100.0000 mL | INTRAVENOUS | Status: DC | PRN

## 2016-05-22 MED ORDER — DOXERCALCIFEROL 4 MCG/2ML IV SOLN
INTRAVENOUS | Status: AC
  Administered 2016-05-22: 2 ug via INTRAVENOUS
  Filled 2016-05-22: qty 2

## 2016-05-22 MED ORDER — LIDOCAINE HCL (PF) 1 % IJ SOLN
5.0000 mL | INTRAMUSCULAR | Status: DC | PRN
Start: 2016-05-22 — End: 2016-05-22

## 2016-05-22 MED ORDER — ALTEPLASE 2 MG IJ SOLR: 2.0000 mg | Freq: Once | INTRAMUSCULAR | Status: DC | PRN

## 2016-05-22 MED ORDER — HEPARIN SODIUM (PORCINE) 1000 UNIT/ML DIALYSIS: 20.0000 [IU]/kg | INTRAMUSCULAR | Status: DC | PRN

## 2016-05-22 MED ORDER — HEPARIN SODIUM (PORCINE) 1000 UNIT/ML DIALYSIS: 1000.0000 [IU] | INTRAMUSCULAR | Status: DC | PRN

## 2016-05-22 MED ORDER — PENTAFLUOROPROP-TETRAFLUOROETH EX AERO: 1.0000 "application " | INHALATION_SPRAY | CUTANEOUS | Status: DC | PRN

## 2016-05-22 MED ORDER — ANGIOPLASTY BOOK
Freq: Once | Status: DC
  Filled 2016-05-22: qty 1

## 2016-05-22 MED ORDER — ASPIRIN 81 MG PO CHEW: 81.0000 mg | CHEWABLE_TABLET | Freq: Every day | ORAL | Status: DC

## 2016-05-22 MED ORDER — METOPROLOL SUCCINATE ER 25 MG PO TB24: 25.0000 mg | ORAL_TABLET | Freq: Every day | ORAL | 3 refills | Status: DC

## 2016-05-22 MED ORDER — LIDOCAINE-PRILOCAINE 2.5-2.5 % EX CREA: 1.0000 "application " | TOPICAL_CREAM | CUTANEOUS | Status: DC | PRN

## 2016-05-22 MED ORDER — HEPARIN SODIUM (PORCINE) 1000 UNIT/ML DIALYSIS: 3000.0000 [IU] | Freq: Once | INTRAMUSCULAR | Status: DC

## 2016-05-22 MED ORDER — LIDOCAINE HCL (PF) 1 % IJ SOLN: 5.0000 mL | INTRAMUSCULAR | Status: DC | PRN

## 2016-05-22 NOTE — Discharge Summary (Signed)
Discharge Summary    Patient ID: [name] [name],  MRN: [id number], DOB/AGE: 07-04-1940 76 y.o.  Admit date: 05/18/2016 Discharge date: 05/22/2016  Primary Care Provider: Midge Minium Primary Cardiologist: Ellyn Hack  Discharge Diagnoses    Active Problems:   ANEMIA, IRON DEFICIENCY   Essential hypertension   Unstable angina (HCC)   Chest pain   Chronic stable angina ([place])   ESRD on dialysis ([place])   Allergies Allergies  Allergen Reactions  . Brilinta [Ticagrelor] Shortness Of Breath  . Shellfish Allergy Other (See Comments)    Causes gout flare-ups    Diagnostic Studies/Procedures    LHC: 05/21/16  Conclusion     Prox LAD to Mid LAD lesion, 100 %stenosed.  Ost Cx to Mid Cx lesion, 100 %stenosed.  Ramus lesion, 60 %stenosed.  Ost RCA to Prox RCA lesion, 50 %stenosed.  Prox RCA to Mid RCA lesion, 95 %stenosed.  Post intervention, there is a 20% residual stenosis.  SVG.  2nd Mrg lesion, 75 %stenosed.  Dist LAD-2 lesion, 75 %stenosed.  Dist LAD-1 lesion, 60 %stenosed.  The left ventricular systolic function is normal.  LV end diastolic pressure is normal.  The left ventricular ejection fraction is 55-65% by visual estimate.   IMPRESSION: Successful PTCA and cutting balloon atherectomy of mid dominant RCA "in-stent restenosis of the lesion that had been intervened on close to 2 years ago with an excellent result. The sheath will be removed and pressure held. Patient will undergo hemodialysis tomorrow most likely be discharged home after that. Dr. [name], the attending physician, has reviewed the angiographic results and plan of treatment.  Quay Burow. MD, [facility] _____________   History of Present Illness     [name] [name] is a 76 y.o. 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  Mr. Cavanagh says at around 7 pm on the day pf admission while laying in the bed, he started having chest pain.  The pain was located at his upper center chest and went into his neck and both shoulders.  He said the pain felt like pressure and achy at the same time. The pain was constant and was relieved some with nitroglycerin taken at home but came back.  His initial pain was a 1/10 in intensity but constant.  Patient denied other related symptoms such as shortness of breath, nausea, lightheadedness, dizziness, or palpitations.  He continued to have pain while in the ED and rated it a 4/10 in intensity.  He said he had similar chest pain almost twice a week but it is usually relieved within minutes of taking his nitroglycerin.  Pain was similar to the pain he had with his prior heart attacks.  Mr. Wolke did not report having swelling in his legs, waking up in the middle of the night due to shortness of breath, or problems with laying flat due to shortness of breath.  He reported taking all of his medications as prescribed and has not missed his dialysis. He was started on IV heparin and nitro drip. Planned for cardiac cath. Nephrology was consulted for his HD needs this admission.   Hospital Course     Consultants: Nephrology   Trop peaked at 5.75. Underwent routine dialysis this admission.  Remained pain free while on IV nitro. Underwent cardiac cath with Dr. Gwenlyn Found on 05/21/16 noted above with successful PTCA and cutting balloon atherectomy of mRCA in stent restenosis. Plan to continue DAPT with ASA and Plavix. Felt well post cath. Morning labs were stable. Underwent HD on the day of discharge. Will plan to dc ASA 81mg  after 30 days, and continue pletal and plavix.   He was seen by Dr. Ellyn Hack and determined stable for discharge home.  Follow up in the office has been arranged. Medications are listed below.  _____________  Discharge Vitals Blood pressure (!) 109/51, pulse 74, temperature 97.5 F (36.4 C), temperature source Oral, resp. rate 17, height 5\' 5"  (1.651 m), weight 144 lb 6.4 oz (65.5 kg), SpO2 99 %.  Filed Weights   05/20/16 0353 05/21/16 0520 05/22/16 0825  Weight: 141 lb 12.8 oz (64.3 kg) 143 lb 3.2 oz (65 kg) 144 lb 6.4 oz (65.5 kg)   General appearance: alert, cooperative, NAD - Appearing HEENT: /AT, EOMI, MMM, anicteric sclera Neck: no adenopathy, no carotid bruit and no JVD Lungs: clear to auscultation bilaterally, normal percussion bilaterally and non-labored Heart: RRR. Normal S1 and S2. + S4. Harsh 2/6 mid peaking, C-D SEM at RUSB--> carotids. Nondisplaced PMI (all orbital bruit from hemodialysis as his exam is on dialysis) Abdomen: soft, non-tender; bowel sounds normal; no masses,  no organomegaly;  Extremities: extremities normal, atraumatic, no cyanosis, and edema  Pulses: 2+ and symmetric;  Neurologic: Mental status: Alert & oriented x 3, thought content appropriate; non-focal exam.  Pleasant mood & affect.   Labs & Radiologic Studies    CBC  Recent Labs  05/21/16 0537 05/22/16 0115  WBC 6.2 7.6  HGB 10.5* 10.0*  HCT 31.5* 31.0*  MCV 94.0 94.2  PLT 245 292   Basic Metabolic Panel  Recent Labs  05/19/16 2059 05/21/16 1123 05/22/16 0115  NA  --  133* 134*  K  --  4.4 3.9  CL  --  95* 96*  CO2  --  27 28  GLUCOSE  --  110* 120*  BUN  --  37* 41*  CREATININE  --  4.75* 5.19*  CALCIUM  --  9.9 9.6  PHOS 1.5*  --   --    Liver Function Tests No results for input(s): AST, ALT, ALKPHOS, BILITOT, PROT, ALBUMIN in the last 72 hours. No results for input(s): LIPASE, AMYLASE in the last 72 hours. Cardiac Enzymes  Recent Labs  05/19/16 2059  TROPONINI 5.75*   BNP Invalid input(s): POCBNP D-Dimer No results for input(s): DDIMER in the last 72 hours. Hemoglobin A1C No  results for input(s): HGBA1C in the last 72 hours. Fasting Lipid Panel No results for input(s): CHOL, HDL, LDLCALC, TRIG, CHOLHDL, LDLDIRECT in the last 72 hours. Thyroid Function Tests No results for input(s): TSH, T4TOTAL, T3FREE, THYROIDAB in the last 72 hours.  Invalid input(s): FREET3 _____________  Dg Chest Portable 1 View  Result Date: 05/18/2016 CLINICAL DATA:  Chest pain and shortness of breath. History of cancer, diabetes, hypertension. EXAM: PORTABLE CHEST 1 VIEW COMPARISON:  04/27/2014 FINDINGS: Postoperative changes in the mediastinum. Vascular grafts in the left axillary region. Shallow inspiration with linear atelectasis in the lung bases. No focal airspace disease or consolidation. No blunting of costophrenic angles. No pneumothorax. Calcified aorta. IMPRESSION: Shallow inspiration with linear atelectasis in the lung bases. No consolidation or edema. Electronically Signed   By: Lucienne Capers M.D.   On: 05/18/2016 22:52   Disposition  Pt is being discharged home today in good condition.  Follow-up Plans & Appointments    Follow-up Information    [facility]myra Deforest, [place] Follow up on 06/01/2016.   Specialties:  Cardiology, Radiology Why:  at 11:30am for your hospital follow up appt.  Contact information: [address] [place] [address] [phone]          Discharge Instructions    Amb Referral to Cardiac Rehabilitation    Complete by:  As directed    Diagnosis:  PTCA   Call MD for:  redness, tenderness, or signs of infection (pain, swelling, redness, odor or green/yellow discharge around incision site)    Complete by:  As directed    Diet - low sodium heart healthy    Complete by:  As directed    Discharge instructions    Complete by:  As directed    Groin Site Care Refer to this sheet in the next few weeks. These instructions provide you with information on caring for yourself after your procedure. Your caregiver may also give you more specific  instructions. Your treatment has been planned according to current medical practices, but problems sometimes occur. Call your caregiver if you have any problems or questions after your procedure. HOME CARE INSTRUCTIONS You may shower 24 hours after the procedure. Remove the bandage (dressing) and gently wash the site with plain soap and water. Gently pat the site dry.  Do not apply powder or lotion to the site.  Do not sit in a bathtub, swimming pool, or whirlpool for 5 to 7 days.  No bending, squatting, or lifting anything over 10 pounds (4.5 kg) as directed by your caregiver.  Inspect the site at least twice daily.  Do not drive home if you are discharged the same day of the procedure. Have someone else drive you.  You may drive 24 hours after the procedure unless otherwise instructed by your caregiver.  What to expect: Any bruising will usually fade within 1 to 2 weeks.  Blood that collects in the tissue (hematoma) may be painful to the touch. It should usually decrease in size and tenderness within 1 to 2 weeks.  SEEK IMMEDIATE MEDICAL CARE IF: You have unusual pain at the groin site or down the affected leg.  You have redness, warmth, swelling, or pain at the groin site.  You have drainage (other than a small amount of blood on the dressing).  You have chills.  You have a fever or persistent symptoms for more than 72 hours.  You have a fever and your symptoms suddenly get worse.  Your leg becomes pale, cool, tingly, or numb.  You have heavy bleeding from the site. Hold pressure on the site. .   Increase activity slowly    Complete by:  As directed       Discharge Medications   Current Discharge Medication List    START taking these medications   Details  aspirin 81 MG chewable tablet Chew 1 tablet (81 mg total) by mouth daily.      CONTINUE these medications which have CHANGED   Details  metoprolol succinate (TOPROL-XL) 25 MG 24 hr tablet Take 1 tablet (25 mg total) by mouth  at bedtime. Take with or immediately following a meal. Qty: 30 tablet, Refills: 3      CONTINUE these medications which have NOT CHANGED   Details  acetaminophen (TYLENOL) 500 MG tablet Take 1,000 mg by mouth 2 (two) times daily as needed for mild  pain.     amLODipine (NORVASC) 5 MG tablet Take 0.5 tablet on non dialysis day  By mouth. Qty: 90 tablet, Refills: 3    atorvastatin (LIPITOR) 20 MG tablet Take 0.5 tablets (10 mg total) by mouth daily. Qty: 90 tablet, Refills: 3    Besifloxacin HCl (BESIVANCE) 0.6 % SUSP Place 1 drop into both eyes See admin instructions. Use eye drops 2 times daily for 2 days following injection by Dr. Zigmund Daniel (every 10 weeks)    cilostazol (PLETAL) 100 MG tablet TAKE ONE TABLET BY MOUTH TWICE DAILY Qty: 180 tablet, Refills: 1    clonazePAM (KLONOPIN) 0.5 MG tablet TAKE ONE TABLET BY MOUTH TWICE DAILY AS NEEDED FOR ANXIETY Qty: 30 tablet, Refills: 0    clopidogrel (PLAVIX) 75 MG tablet Take 1 tablet (75 mg total) by mouth daily. Qty: 90 tablet, Refills: 3    ferric citrate (AURYXIA) 1 GM 210 MG(Fe) tablet Take 420 mg by mouth 3 (three) times daily with meals.    glucose blood (TRUE METRIX BLOOD GLUCOSE TEST) test strip 1 each by Other route as needed for other. Use as instructed to test sugars 2-3 times daily. Dx. E11.40 Qty: 100 each, Refills: 12    isosorbide mononitrate (IMDUR) 120 MG 24 hr tablet Take 1 tablet (120 mg total) by mouth daily. Qty: 90 tablet, Refills: 3    levothyroxine (SYNTHROID, LEVOTHROID) 125 MCG tablet TAKE ONE TABLET BY MOUTH ONCE DAILY BEFORE BREAKFAST Qty: 90 tablet, Refills: 1    lidocaine-prilocaine (EMLA) cream Apply 1 application topically See admin instructions. Apply prior to dialysis treatments on Tuesday, Thursday and Saturday    MELATONIN PO Take 1 tablet by mouth at bedtime as needed (sleep).    NITROSTAT 0.4 MG SL tablet PLACE 1 TABLET UNDER THE TONGUE EVERY 5 MINUTES AS NEEDED FOR CHEST PAIN (MAX 3 DOSES PER  DAY) Qty: 25 tablet, Refills: 3    oxyCODONE-acetaminophen (ROXICET) 5-325 MG tablet Take 1 tablet by mouth every 6 (six) hours as needed for severe pain. Qty: 12 tablet, Refills: 0    pantoprazole (PROTONIX) 40 MG tablet Take 1 tablet (40 mg total) by mouth daily. Qty: 90 tablet, Refills: 3      STOP taking these medications     loperamide (IMODIUM A-D) 2 MG tablet          Aspirin prescribed at discharge?  Yes High Intensity Statin Prescribed? (Lipitor 40-80mg  or Crestor 20-40mg ): No: Unable to tolerate high dose statin. Primary cardiologist aware Beta Blocker Prescribed? Yes For EF <40%, was ACEI/ARB Prescribed? No: ESRD ADP Receptor Inhibitor Prescribed? (i.e. Plavix etc.-Includes Medically Managed Patients): Yes For EF <40%, Aldosterone Inhibitor Prescribed? No: EF ok Was EF assessed during THIS hospitalization? Yes Was Cardiac Rehab II ordered? (Included Medically managed Patients): Yes   Outstanding Labs/Studies   N/A  Duration of Discharge Encounter   Greater than 30 minutes including physician time.  Signed, Reino Bellis NP-C 05/22/2016, 11:51 AM   Patient seen and examined during dialysis. He then saw him after dialysis and he is doing quite well. Ambulated in the hallway without further angina. He feels well and is ready for discharge. We will simply continue him on his home regimen as he has been otherwise doing well with a blood pressure standpoint since we made last adjustments.  He is already on aspirin and statin plus Pletal. He is on an extensive and angina regimen which is the best we can do with his blood pressure given his history of  dialysis related hypotension.  Ready for discharge home on his birthday.  [name], M.D., M.S. Interventional Cardiologist   Pager # [phone] Phone # [phone] [address] [place], [place] [address]

## 2016-05-22 NOTE — Progress Notes (Addendum)
CARDIAC REHAB PHASE I   PRE:  Rate/Rhythm: 74 SR    BP: sitting 154/45    SaO2:   MODE:  Ambulation: 700 ft   POST:  Rate/Rhythm: 77 SR    BP: sitting 158/77     SaO2:   Pt feeling well today. Able to walk without angina. Sts he feels tired toward the end. Reviewed ed with pt, he is very versed in his CAD. Understands importance of Plavix/Pletal. He tries to be as active as he can. I encouraged walking as tolerated. He is not interested in CRPII as he has done it before and it is also difficult to do with HD. I will send referral to [facility]. [place], ACSM 05/22/2016 8:21 AM

## 2016-05-22 NOTE — Care Management Important Message (Signed)
Important Message  Patient Details  Name: [name] [name] MRN: [id number] Date of Birth: 01-10-1940   Medicare Important Message Given:  Yes    Erenest Rasher, RN 05/22/2016, 2:54 PM

## 2016-05-22 NOTE — Telephone Encounter (Signed)
TCM Phone Call- Please call pt-Pt has an appointment on 06-01-16 with [facility].

## 2016-05-22 NOTE — Procedures (Signed)
Had heart cath yest w/ PCI to an in-stent restenosis.  Stable and for dc today.  Stable on HD.     I was present at this dialysis session, have reviewed the session itself and made  appropriate changes Kelly Splinter MD [place] pager [phone]   05/22/2016, 1:38 PM

## 2016-05-22 NOTE — Progress Notes (Signed)
Site area: R groin  Site Prior to Removal:  Level 0  Pressure Applied For 26 MINUTES    Minutes Beginning at 21:19  Manual:   Yes  Patient Status During Pull:  Stable  Post Pull Groin Site:  Level 1 Bruised  Post Pull Instructions Given:  Yes  Post Pull Pulses Present: Yes  Dressing Applied:  Yes  Comments:  Pt tolerated procedure well. VSS

## 2016-05-22 NOTE — Care Management Note (Signed)
Case Management Note  Patient Details  Name: JAYMIAN BOGART MRN: [id number] Date of Birth: [birth year]  Subjective/Objective:   From home, s/p successful ptca and balloon atherectomy, will be on plavix.  Ambulated 700 feet with cardiac rehab.                 Action/Plan: NCM will follow for dc needs.  Expected Discharge Date:  05/22/16               Expected Discharge Plan:  Home/Self Care  In-House Referral:     Discharge planning Services  CM Consult  Post Acute Care Choice:    Choice offered to:     DME Arranged:    DME Agency:     HH Arranged:    HH Agency:     Status of Service:  Completed, signed off  If discussed at [employer] of Stay Meetings, dates discussed:    Additional Comments:  Zenon Mayo, RN 05/22/2016, 8:48 AM

## 2016-05-23 ENCOUNTER — Other Ambulatory Visit: Payer: Self-pay | Admitting: Family Medicine

## 2016-05-23 NOTE — Telephone Encounter (Signed)
Patient contacted regarding discharge from [facility] 05/22/16.  Patient understands to follow up with provider [facility] on 06/01/16 at 11:30 at [facility]. Patient understands discharge instructions? yes  Patient understands medications and regiment? yes  Patient understands to bring all medications to this visit? Yes     Patient also states he was having skipping heartbeat last night but none today. Continue to monitor

## 2016-05-24 DIAGNOSIS — N2581 Secondary hyperparathyroidism of renal origin: Secondary | ICD-10-CM | POA: Diagnosis not present

## 2016-05-24 DIAGNOSIS — N186 End stage renal disease: Secondary | ICD-10-CM | POA: Diagnosis not present

## 2016-05-24 DIAGNOSIS — D631 Anemia in chronic kidney disease: Secondary | ICD-10-CM | POA: Diagnosis not present

## 2016-05-24 DIAGNOSIS — E1129 Type 2 diabetes mellitus with other diabetic kidney complication: Secondary | ICD-10-CM | POA: Diagnosis not present

## 2016-05-24 DIAGNOSIS — E876 Hypokalemia: Secondary | ICD-10-CM | POA: Diagnosis not present

## 2016-05-25 ENCOUNTER — Encounter: Payer: Self-pay | Admitting: Vascular Surgery

## 2016-05-26 DIAGNOSIS — E876 Hypokalemia: Secondary | ICD-10-CM | POA: Diagnosis not present

## 2016-05-26 DIAGNOSIS — D631 Anemia in chronic kidney disease: Secondary | ICD-10-CM | POA: Diagnosis not present

## 2016-05-26 DIAGNOSIS — E1129 Type 2 diabetes mellitus with other diabetic kidney complication: Secondary | ICD-10-CM | POA: Diagnosis not present

## 2016-05-26 DIAGNOSIS — N186 End stage renal disease: Secondary | ICD-10-CM | POA: Diagnosis not present

## 2016-05-26 DIAGNOSIS — N2581 Secondary hyperparathyroidism of renal origin: Secondary | ICD-10-CM | POA: Diagnosis not present

## 2016-05-29 DIAGNOSIS — E1129 Type 2 diabetes mellitus with other diabetic kidney complication: Secondary | ICD-10-CM | POA: Diagnosis not present

## 2016-05-29 DIAGNOSIS — N2581 Secondary hyperparathyroidism of renal origin: Secondary | ICD-10-CM | POA: Diagnosis not present

## 2016-05-29 DIAGNOSIS — E876 Hypokalemia: Secondary | ICD-10-CM | POA: Diagnosis not present

## 2016-05-29 DIAGNOSIS — N186 End stage renal disease: Secondary | ICD-10-CM | POA: Diagnosis not present

## 2016-05-29 DIAGNOSIS — D631 Anemia in chronic kidney disease: Secondary | ICD-10-CM | POA: Diagnosis not present

## 2016-05-31 DIAGNOSIS — E876 Hypokalemia: Secondary | ICD-10-CM | POA: Diagnosis not present

## 2016-05-31 DIAGNOSIS — N186 End stage renal disease: Secondary | ICD-10-CM | POA: Diagnosis not present

## 2016-05-31 DIAGNOSIS — D631 Anemia in chronic kidney disease: Secondary | ICD-10-CM | POA: Diagnosis not present

## 2016-05-31 DIAGNOSIS — N2581 Secondary hyperparathyroidism of renal origin: Secondary | ICD-10-CM | POA: Diagnosis not present

## 2016-05-31 DIAGNOSIS — E1129 Type 2 diabetes mellitus with other diabetic kidney complication: Secondary | ICD-10-CM | POA: Diagnosis not present

## 2016-06-01 ENCOUNTER — Ambulatory Visit (INDEPENDENT_AMBULATORY_CARE_PROVIDER_SITE_OTHER): Payer: Self-pay | Admitting: Vascular Surgery

## 2016-06-01 ENCOUNTER — Ambulatory Visit (INDEPENDENT_AMBULATORY_CARE_PROVIDER_SITE_OTHER): Payer: Medicare Other | Admitting: Physician Assistant

## 2016-06-01 ENCOUNTER — Encounter: Payer: Self-pay | Admitting: Vascular Surgery

## 2016-06-01 ENCOUNTER — Encounter: Payer: Self-pay | Admitting: Physician Assistant

## 2016-06-01 VITALS — BP 144/52 | HR 68 | Ht 65.0 in | Wt 145.6 lb

## 2016-06-01 VITALS — BP 140/63 | HR 66 | Temp 96.9°F | Resp 20 | Ht 65.0 in | Wt 146.0 lb

## 2016-06-01 DIAGNOSIS — E785 Hyperlipidemia, unspecified: Secondary | ICD-10-CM

## 2016-06-01 DIAGNOSIS — E039 Hypothyroidism, unspecified: Secondary | ICD-10-CM

## 2016-06-01 DIAGNOSIS — N186 End stage renal disease: Secondary | ICD-10-CM

## 2016-06-01 DIAGNOSIS — I200 Unstable angina: Secondary | ICD-10-CM

## 2016-06-01 DIAGNOSIS — Z992 Dependence on renal dialysis: Secondary | ICD-10-CM

## 2016-06-01 DIAGNOSIS — I10 Essential (primary) hypertension: Secondary | ICD-10-CM | POA: Diagnosis not present

## 2016-06-01 DIAGNOSIS — I25708 Atherosclerosis of coronary artery bypass graft(s), unspecified, with other forms of angina pectoris: Secondary | ICD-10-CM | POA: Diagnosis not present

## 2016-06-01 DIAGNOSIS — Z79899 Other long term (current) drug therapy: Secondary | ICD-10-CM

## 2016-06-01 DIAGNOSIS — I209 Angina pectoris, unspecified: Secondary | ICD-10-CM

## 2016-06-01 MED ORDER — AMLODIPINE BESYLATE 5 MG PO TABS: 5.0000 mg | ORAL_TABLET | Freq: Every day | ORAL | 3 refills | Status: DC

## 2016-06-01 NOTE — Progress Notes (Signed)
Cardiology Office Note    Date:  06/02/2016   ID:  Naythan, Douthit 1940-10-08, MRN [id number]  PCP:  Midge Minium, MD  Cardiologist:  Dr. Ellyn Hack  Chief Complaint  Patient presents with  . Follow-up    seen for Dr. Ellyn Hack, recent PCI    History of Present Illness:  JAMES SENN is a 76 y.o. male with PMH of ESRD on HD (TTS), hypertension, anemia of chronic disease, hypothyroidism, CAD s/p CABG, L arm steal syndrome In the setting of AV fistula. He went to the hospital on 05/19/2016 with chest pain. Previous echocardiogram obtained on 02/23/2013 showed EF 18-29%, grade 2 diastolic dysfunction, moderate LVH, paradoxical ventricular septal motion, mild to moderate MR, mild AI, mild TR. Troponin peaked at 5.75. He underwent cardiac catheterization on 05/21/2016 which showed 100% proximal to mid LAD occlusion, 100% ostial to mid left circumflex occlusion, 95% proximal to mid RCA lesion successfully treated with PTCA and cutting balloon atherectomy of mid dominant RCA in-stent restenosis, and LIMA to distal LAD, SVG to OM 2. EF 55-65%.  Patient presents today for follow-up. He no longer has any chest pain at rest after recent PCI, however he continued to have chest pain with exertion. On further questioning, it seems he has been having chest pain with exertion for over a year now, this has not changed recently despite PCI. However he says the current chest pain with exertion feels different from his previous episodes. He is unable to tell tell me how it is different. He is currently on 120 mg daily of Imdur, theoretically, the maximum dose of Imdur is 240 mg daily. He is also on 2.5 mg amlodipine, I recommended him to increase amlodipine to 5 mg daily on nondialysis days to further help with angina. He says he has tried Ranexa before, however was unble to afford it. I think as long as he does not have any chest pain at rest, we don't need another relook cardiac catheterization. I would like  to attempt medical therapy first.   His right forearm is wrapped in dressing after his dog accidentally dug its nail into his skin.   Past Medical History:  Diagnosis Date  . Anemia    Likely secondary to history of GI bleed  . Anginal pain (HCC)    Chronic stable  . Anxiety   . Arthritis   . BPH (benign prostatic hyperplasia)   . CAD (coronary artery disease) of bypass graft 2006, 2012   In 2006: Occluded SVG-OM noted (SVG-diagonal was previously occluded); 2012: Severe lesion in redo SVG-OM1 --> BMS PCI   . CAD in native artery; and the grafts 1992, 1997, 2002, 2006, 2012   CABG 1992 and redo CABG 2006  . CAD S/P percutaneous coronary angioplasty October 2012; Feb, Apr & Oct 2015; Feb 2016   a) s/p CABG 1992 and redo 2006. b) 10/12: NSTEMI: PCI to SVG-OM1 Integrity BMS 3.5 x 15 (3.85 mm) c) 2/15: UA - PCI mid RCA Xience Ap DES 2.75 x 15 (3.1 mm). d) 4/15 : UA - focal dRCA Resolute DES 2.25 x 8 (2.5 mm). e) 10/15: PCI to mRCA ISR w/ post stent lesion - Promus P 2.75 x 16 (3.25 mm). f) 2/16: NSTEMI: CBA/PTCA of  mRCA ISR (3.5 mm post-dilation); g) 7/16 ISR RCA->CBA/PTCA, residual 20%.  . Carotid arterial disease ([place])    a) Duplex 05/2012: R BULB/PROX ICA: 50-69%, L BULB/PROX ICA: 0-49%. ;; 04/29/2014: 93-71% RICA, 69-67% LICA. Patetn vertebrals,  .  Chronic low back pain   . Colon cancer [facility]) 2003   colectomy for CA. no recurrence . never required  radiation or chem  . Diabetes mellitus without complication ([place])   . Dyspnea    "when I get too much Fluid"  . End stage renal disease on dialysis [facility])    Dialyzes at [facility]: Tues/Th/Sat - left upper cavity AV fistula brachiocephalic  . Gout   . Heart murmur   . History of hiatal hernia   . History of Non-ST elevated myocardial infarction (non-STEMI) 1992, 2006, 2012; 02/2013  . Hyperlipidemia   . Hypertension   . Hypothyroidism    On supplementation  . Ischemic cardiomyopathy    a) EF 45-50% by echo 2015. b) EF 50% by cath  04/2014.  Marland Kitchen LBBB (left bundle branch block)    Chronic  . Peptic ulcer disease  2004  . Plavix resistance    a) P2Y12 264 in 02/2014 - put on Brilinta but did not tolerate due to SOB. b) cannot be on Effient due to history of stroke. c) Plavix increased to 75mg  BID and Pletal added 04/2014 due to ISR.  Marland Kitchen Pneumonia   . Skin cancer 02/1999   nose  . Stroke [facility]) 1998    Past Surgical History:  Procedure Laterality Date  . AORTIC ARCH ANGIOGRAPHY N/A 05/09/2016   Procedure: Aortic Arch Angiography;  Surgeon: Waynetta Sandy, MD;  Location: [place] [facility];  Service: Cardiovascular;  Laterality: N/A;  . APPENDECTOMY    . AV FISTULA REPAIR Left 05/2009  . CARDIAC CATHETERIZATION  10/16/2010   patent  LIMA to the LAD , PATENT  svg TO 2nd marginals ,occluded PLA with collaterals and SVG to the OM  had 90% stenosis  was txlge  bare - metal  3.5  Integrity ten  postdilate  36-37  mm    . CARDIAC CATHETERIZATION  03/22/2004   loss of both SVG,severe disease in prox and ostial  circ not amenable to invention. mod diease distal LAD  AFTER BYPASS GRAFT;-CVTS to evaluate   . CARDIAC CATHETERIZATION N/A 08/03/2014   Procedure: Left Heart Cath and Cors/Grafts Angiography;  Surgeon: Leonie Man, MD;  Location: [place] [facility];  Service: Cardiovascular;  Laterality: N/A;  . CARDIAC CATHETERIZATION N/A 08/03/2014   Procedure: Coronary Balloon Angioplasty;  Surgeon: Leonie Man, MD;  Location: [place] [facility];  Service: Cardiovascular;: Aggressive Cutting Balloon - [place] Balloon PTCA of ISR site in mRCA (3 stent layer)  . CAROTID DOPPLER  05/23/2012   ABN CAROTID-- RGT BULB/PROX ICA mild to mod 50-60%;lft bulb/prox mild to mod 0-49%;left subclavian abn waveforms consistent with patients lft arm A/V fistula  . CATARACT EXTRACTION W/ INTRAOCULAR LENS  IMPLANT, BILATERAL Bilateral   . CHOLECYSTECTOMY  2009  . COLON RESECTION  2003   transverse and proximal descending w/ primar anastomosis  .  COLON SURGERY  2003   "cancer"  . COLONOSCOPY    . CORONARY ANGIOPLASTY  04/03/1995   OM and Circ  . CORONARY ANGIOPLASTY  02/01/2000   CIRC  . CORONARY ANGIOPLASTY  02/09/14   Cutting Balloon PTCA of mRCA ISR 99% - 3.5 mm  . CORONARY ARTERY BYPASS GRAFT  08/22/1990   INITIAL:LIMA to LAD, SVG to  Diagonal, SVG to OM  . CORONARY ARTERY BYPASS GRAFT  2006   SVG to OM1,SVG to OM2 with patent LIMA  to LAD and occluded vein  graft to the diagonal  and occluded  vein grft to OM from  prior  surgery  . CORONARY ATHERECTOMY  05/21/2016   Successful PTCA and cutting balloon atherectomy of mid dominant RCA "in-stent restenosis of the lesion that had been intervened on close to 2 years ago with an excellent result.   . CORONARY BALLOON ANGIOPLASTY N/A 05/21/2016   Procedure: Coronary Balloon Angioplasty;  Surgeon: Lorretta Harp, MD;  Location: [place] [facility];  Service: Cardiovascular;  Laterality: N/A;  . DISTAL REVASCULARIZATION AND INTERVAL LIGATION (DRIL) Left 05/14/2016   Procedure: DISTAL REVASCULARIZATION AND INTERVAL LIGATION (DRIL) LEFT ARM;  Surgeon: Waynetta Sandy, MD;  Location: [place];  Service: Vascular;  Laterality: Left;  . DOPPLER ECHOCARDIOGRAPHY  01/25/2012   EF 50 to 55%  . EVENT MONITOR  01/23/2012-02/06/2012   SINUS ,LBBB,unifocal PVCs  . LEFT AND RIGHT HEART CATHETERIZATION WITH CORONARY/GRAFT ANGIOGRAM N/A 04/20/2014   Procedure: LEFT AND RIGHT HEART CATHETERIZATION WITH Beatrix Fetters;  Surgeon: Sherren Mocha, MD;  Location: [facility] CATH LAB;  Service: Cardiovascular;  Laterality: N/A;  . LEFT HEART CATH AND CORS/GRAFTS ANGIOGRAPHY N/A 05/21/2016   Procedure: LEFT HEART CATH AND CORS/GRAFTS ANGIOGRAPHY;  Surgeon: Lorretta Harp, MD;  Location: [place] [facility];  Service: Cardiovascular;  Laterality: N/A;  . LEFT HEART CATHETERIZATION WITH CORONARY ANGIOGRAM N/A 02/23/2013   Procedure: LEFT HEART CATHETERIZATION WITH CORONARY ANGIOGRAM;  Surgeon: Lorretta Harp, MD;  Location: [facility] CATH LAB;  Service: Cardiovascular;  Laterality: N/A;  . LEFT HEART CATHETERIZATION WITH CORONARY/GRAFT ANGIOGRAM N/A 04/30/2013   Procedure: LEFT HEART CATHETERIZATION WITH Beatrix Fetters;  Surgeon: Leonie Man, MD;  Location: [facility] CATH LAB;  Service: Cardiovascular;  Laterality: N/A;  . LEFT HEART CATHETERIZATION WITH CORONARY/GRAFT ANGIOGRAM N/A 10/15/2013   Procedure: LEFT HEART CATHETERIZATION WITH Beatrix Fetters;  Surgeon: Leonie Man, MD;  Location: [facility] CATH LAB;  Service: Cardiovascular;  Laterality: N/A;  . LEFT HEART CATHETERIZATION WITH CORONARY/GRAFT ANGIOGRAM N/A 02/09/2014   Procedure: LEFT HEART CATHETERIZATION WITH Beatrix Fetters;  Surgeon: Leonie Man, MD;  Location: [facility] CATH LAB;  Service: Cardiovascular;  Laterality: N/A;  . Lower Extremity Arterial Dopplers  October 2014   Calcified but non-occlusive peripheral arteries. No evidence of stenosis  . NM MYOCAR PERF WALL MOTION  10/12/2011   EF 54%,LV normal ; no signifiant ischemia  . PERCUTANEOUS CORONARY STENT INTERVENTION (PCI-S)  02/23/2013   mid RCA 80% & 70% - Xience 2.75 mm x 15 mm ( 3.4mm) ; LIMA-LAD patent, SVG-OM patent (stent patent); SVG-Diag patent.  Marland Kitchen PERCUTANEOUS CORONARY STENT INTERVENTION (PCI-S)  April 2015   dRCA - Integrity Resolute DES   2.25 mm x 51mm (2.5 mm)  . PERCUTANEOUS CORONARY STENT INTERVENTION (PCI-S)  Oct 15 2013   [place] Angina: mRCA ISR with post-stent stenosis -- [place] PTCA & PCI Promus Premier DES 2.75 mm x 16 mm (3.25 mm)  . POSTERIOR LUMBAR FUSION    . REVISON OF ARTERIOVENOUS FISTULA Left 42/35/3614   Procedure: PLICATION OF LEFT UPPER ARM  ARTERIOVENOUS FISTULA  ANEURYSM;  Surgeon: Rosetta Posner, MD;  Location: [place];  Service: Vascular;  Laterality: Left;  . TRANSTHORACIC ECHOCARDIOGRAM  02/23/2013   Moderate concentric hypertrophy. EF 4500%. Septal bounce. Grade 2 diastolic dysfunction (pseudo-normal - severely elevated filling  pressures) mild to moderate MR and moderate LA dilation to  . UPPER EXTREMITY ANGIOGRAPHY Left 05/09/2016   Procedure: Upper Extremity Angiography;  Surgeon: Waynetta Sandy, MD;  Location: [place] [facility];  Service: Cardiovascular;  Laterality:  Left;    Current Medications: Outpatient Medications Prior to Visit  Medication Sig Dispense Refill  . acetaminophen (TYLENOL) 500 MG tablet Take 1,000 mg by mouth 2 (two) times daily as needed for mild pain.     Marland Kitchen aspirin 81 MG chewable tablet Chew 1 tablet (81 mg total) by mouth daily.    Marland Kitchen Besifloxacin HCl (BESIVANCE) 0.6 % SUSP Place 1 drop into both eyes See admin instructions. Use eye drops 2 times daily for 2 days following injection by Dr. Zigmund Daniel (every 10 weeks)    . cilostazol (PLETAL) 100 MG tablet TAKE ONE TABLET BY MOUTH TWICE DAILY 180 tablet 1  . clonazePAM (KLONOPIN) 0.5 MG tablet TAKE ONE TABLET BY MOUTH TWICE DAILY AS NEEDED FOR ANXIETY 30 tablet 0  . clopidogrel (PLAVIX) 75 MG tablet Take 1 tablet (75 mg total) by mouth daily. 90 tablet 3  . ferric citrate (AURYXIA) 1 GM 210 MG(Fe) tablet Take 420 mg by mouth 3 (three) times daily with meals.    Marland Kitchen glucose blood (TRUE METRIX BLOOD GLUCOSE TEST) test strip 1 each by Other route as needed for other. Use as instructed to test sugars 2-3 times daily. Dx. E11.40 100 each 12  . isosorbide mononitrate (IMDUR) 120 MG 24 hr tablet Take 1 tablet (120 mg total) by mouth daily. 90 tablet 3  . levothyroxine (SYNTHROID, LEVOTHROID) 125 MCG tablet TAKE ONE TABLET BY MOUTH BEFORE BREAKFAST 90 tablet 1  . lidocaine-prilocaine (EMLA) cream Apply 1 application topically See admin instructions. Apply prior to dialysis treatments on Tuesday, Thursday and Saturday    . MELATONIN PO Take 1 tablet by mouth at bedtime as needed (sleep).    . metoprolol succinate (TOPROL-XL) 25 MG 24 hr tablet Take 1 tablet (25 mg total) by mouth at bedtime. Take with or immediately following a meal. 30 tablet 3  .  NITROSTAT 0.4 MG SL tablet PLACE 1 TABLET UNDER THE TONGUE EVERY 5 MINUTES AS NEEDED FOR CHEST PAIN (MAX 3 DOSES PER DAY) 25 tablet 3  . oxyCODONE-acetaminophen (ROXICET) 5-325 MG tablet Take 1 tablet by mouth every 6 (six) hours as needed for severe pain. 12 tablet 0  . pantoprazole (PROTONIX) 40 MG tablet Take 1 tablet (40 mg total) by mouth daily. 90 tablet 3  . amLODipine (NORVASC) 5 MG tablet Take 0.5 tablet on non dialysis day  By mouth. (Patient taking differently: Take 0.5 tablet on non dialysis day) 90 tablet 3  . atorvastatin (LIPITOR) 20 MG tablet Take 0.5 tablets (10 mg total) by mouth daily. (Patient taking differently: Take 10 mg by mouth at bedtime. ) 90 tablet 3   No facility-administered medications prior to visit.      Allergies:   Brilinta [ticagrelor] and Shellfish allergy   Social History   Social History  . Marital status: Married    Spouse name: N/A  . Number of children: 3  . Years of education: N/A   Social History Main Topics  . Smoking status: Never Smoker  . Smokeless tobacco: Never Used  . Alcohol use No  . Drug use: No  . Sexual activity: No   Other Topics Concern  . None   Social History Narrative   Long-term patient of Dr. Rollene Fare.   Married father of 42, grandfather 57.   Never smoked.   Not exercising D2 hip and back pain & now Angina     Family History:  The patient's family history includes Diabetes in his father and mother; Heart attack  in his father and mother; Heart disease in his father and mother; Hypertension in his father and mother; Stroke in his paternal aunt and paternal uncle.   ROS:   Please see the history of present illness.    ROS All other systems reviewed and are negative.   PHYSICAL EXAM:   VS:  BP (!) 144/52   Pulse 68   Ht 5\' 5"  (1.651 m)   Wt 145 lb 9.6 oz (66 kg)   BMI 24.23 kg/m    GEN: Well nourished, well developed, in no acute distress  HEENT: normal  Neck: no JVD, carotid bruits, or masses Cardiac:  RRR; no murmurs, rubs, or gallops,no edema.  L forearm AVF. R forearm in dressing Respiratory:  clear to auscultation bilaterally, normal work of breathing GI: soft, nontender, nondistended, + BS MS: no deformity or atrophy  Skin: warm and dry, no rash Neuro:  Alert and Oriented x 3, Strength and sensation are intact Psych: euthymic mood, full affect  Wt Readings from Last 3 Encounters:  06/01/16 145 lb 9.6 oz (66 kg)  06/01/16 146 lb (66.2 kg)  05/22/16 141 lb 12.1 oz (64.3 kg)      Studies/Labs Reviewed:   EKG:  EKG is ordered today.  The ekg ordered today demonstrates Normal sinus rhythm with persistent T-wave inversion in lateral leads and left bundle branch block.  Recent Labs: 08/10/2015: TSH 0.51 02/02/2016: ALT 10 05/22/2016: BUN 41; Creatinine, Ser 5.19; Hemoglobin 10.0; Platelets 277; Potassium 3.9; Sodium 134   Lipid Panel    Component Value Date/Time   CHOL 107 08/10/2015 0943   TRIG 113.0 08/10/2015 0943   HDL 28.80 (L) 08/10/2015 0943   CHOLHDL 4 08/10/2015 0943   VLDL 22.6 08/10/2015 0943   LDLCALC 56 08/10/2015 0943   LDLDIRECT 33.5 12/22/2012 0946    Additional studies/ records that were reviewed today include:   Echo 02/23/2013 LV EF: 45% -  50%  - Left ventricle: There is false tendon in the apex. There is paradoxical septal motion. The cavity size was normal. There was moderate concentric hypertrophy. Systolic function was mildly reduced. The estimated ejection fraction was in the range of 45% to 50%. Features are consistent with a pseudonormal left ventricular filling pattern, with concomitant abnormal relaxation and increased filling pressure (grade 2 diastolic dysfunction). Doppler parameters are consistent with severely elevated ventricular end-diastolic filling pressure. - Ventricular septum: Septal motion showed paradox. - Aortic valve: Mild regurgitation. - Mitral valve: Mild to moderate regurgitation. - Left atrium:  The atrium was moderately dilated. - Right ventricle: Systolic function was normal. - Right atrium: The atrium was normal in size. - Tricuspid valve: Mild regurgitation. - Pulmonic valve: No regurgitation. - Pulmonary arteries: Systolic pressure was within the normal range. - Inferior vena cava: The vessel was normal in size. - Pericardium, extracardiac: There was no pericardial effusion.    Cath 05/21/2016 Conclusion     Prox LAD to Mid LAD lesion, 100 %stenosed.  Ost Cx to Mid Cx lesion, 100 %stenosed.  [place] lesion, 60 %stenosed.  Ost RCA to Prox RCA lesion, 50 %stenosed.  Prox RCA to Mid RCA lesion, 95 %stenosed.  Post intervention, there is a 20% residual stenosis.  SVG.  2nd Mrg lesion, 75 %stenosed.  Dist LAD-2 lesion, 75 %stenosed.  Dist LAD-1 lesion, 60 %stenosed.  The left ventricular systolic function is normal.  LV end diastolic pressure is normal.  The left ventricular ejection fraction is 55-65% by visual estimate.  ASSESSMENT:    1. Coronary artery disease of bypass graft of native heart with stable angina pectoris ([place])   2. Hyperlipidemia with target LDL less than 70   3. Medication management   4. Hypothyroidism, unspecified type   5. ESRD on hemodialysis ([place])   6. Essential hypertension      PLAN:  In order of problems listed above:  1. CAD s/p CABG  -  cardiac catheterization on 05/21/2016 which showed 100% proximal to mid LAD occlusion, 100% ostial to mid left circumflex occlusion, 95% proximal to mid RCA lesion successfully treated with PTCA and cutting balloon atherectomy of mid dominant RCA in-stent restenosis, and LIMA to distal LAD, SVG to OM 2. EF 55-65%.  - continue to chest pain with exertion which has been chronic.   2. HTN: Blood pressure mildly elevated today, but he is known to have low BP on dialysis days. Will increase amlodipine on non-dialysis days only  3. HLD: He is on 10 mg daily of Lipitor. Given  recent PCI and the severe coronary artery disease, I think he would benefit from a higher dose of Lipitor. He says he is only on 10 mg daily as he is lipid panel has always been well-controlled. I recommended repeat a fasting lipid panel and LFTs on his next follow-up.  4. Hypothyroidism: Continue Synthroid  5. ESRD on HD: On hemodialysis Tuesday, Thursday and Saturday.    Medication Adjustments/Labs and Tests Ordered: Current medicines are reviewed at length with the patient today.  Concerns regarding medicines are outlined above.  Medication changes, Labs and Tests ordered today are listed in the Patient Instructions below. Patient Instructions  Medication Instructions:  INCREASE- Amlodipine 5 mg daily take only on Non- dialysis days  Labwork: Fasting lipid Liver   Testing/Procedures: None Ordered  Follow-Up: Keep appointment on June 21st @ 10:20 am with Dr Ellyn Hack   Any Other Special Instructions Will Be Listed Below (If Applicable).  If you need a refill on your cardiac medications before your next appointment, please call your pharmacy.      Hilbert Corrigan, [place]  06/02/2016 10:30 AM    [address], [place], [place]  [address] Phone: [phone]; Fax: [phone]

## 2016-06-01 NOTE — Progress Notes (Signed)
Subjective:     Patient ID: [name] [name], male   DOB: 1940/12/20, 76 y.o.   MRN: [id number]  HPI [name] [name] follows up from recent distal revascularization with interval ligation. He now states that his left hand is warm and fully functional and he is able to dialyze via his fistula still. He unfortunately was admitted with myocardial infarction in the near postop period. He is recovered well from that. He does have a hematoma was left upper arm but is not interested in any intervention at this time.  Review of Systems Left hand pain resolved, hematoma of upper arm    Objective:   Physical Exam aaox3 Left radial, ulnar, palmar arch signals Palpable thrill in upper extremity avf Bulge in upper medial arm without pulsatility    Assessment/plan     [name] [name] follows up status post DRI L procedure and is doing well now without hand pain. He does have a bulge on the medial aspect of his upper arm that does not appear to be pulsatile and most consistent with hematoma. He is not interested in any intervention given his recent complications of myocardial infarction requiring additional hospitalization. I told him that should he have further expansion or pain from this he can call and we will explore in the operating room. He can other wise follow-up on a when necessary basis.     Gwyneth Fernandez C. Donzetta Matters, MD [facility] Office: [phone] Pager: [phone]

## 2016-06-01 NOTE — Patient Instructions (Addendum)
Medication Instructions:  INCREASE- Amlodipine 5 mg daily take only on Non- dialysis days  Labwork: Fasting lipid Liver   Testing/Procedures: None Ordered  Follow-Up: Keep appointment on June 21st @ 10:20 am with Dr Ellyn Hack   Any Other Special Instructions Will Be Listed Below (If Applicable).  If you need a refill on your cardiac medications before your next appointment, please call your pharmacy.

## 2016-06-02 DIAGNOSIS — D631 Anemia in chronic kidney disease: Secondary | ICD-10-CM | POA: Diagnosis not present

## 2016-06-02 DIAGNOSIS — E876 Hypokalemia: Secondary | ICD-10-CM | POA: Diagnosis not present

## 2016-06-02 DIAGNOSIS — N186 End stage renal disease: Secondary | ICD-10-CM | POA: Diagnosis not present

## 2016-06-02 DIAGNOSIS — E1129 Type 2 diabetes mellitus with other diabetic kidney complication: Secondary | ICD-10-CM | POA: Diagnosis not present

## 2016-06-02 DIAGNOSIS — N2581 Secondary hyperparathyroidism of renal origin: Secondary | ICD-10-CM | POA: Diagnosis not present

## 2016-06-05 DIAGNOSIS — E1129 Type 2 diabetes mellitus with other diabetic kidney complication: Secondary | ICD-10-CM | POA: Diagnosis not present

## 2016-06-05 DIAGNOSIS — D631 Anemia in chronic kidney disease: Secondary | ICD-10-CM | POA: Diagnosis not present

## 2016-06-05 DIAGNOSIS — N2581 Secondary hyperparathyroidism of renal origin: Secondary | ICD-10-CM | POA: Diagnosis not present

## 2016-06-05 DIAGNOSIS — E876 Hypokalemia: Secondary | ICD-10-CM | POA: Diagnosis not present

## 2016-06-05 DIAGNOSIS — N186 End stage renal disease: Secondary | ICD-10-CM | POA: Diagnosis not present

## 2016-06-06 NOTE — Anesthesia Postprocedure Evaluation (Signed)
Anesthesia Post Note  Patient: [name] [name]  Procedure(s) Performed: Procedure(s) (LRB): DISTAL REVASCULARIZATION AND INTERVAL LIGATION (DRIL) LEFT ARM (Left)  Patient location during evaluation: PACU Anesthesia Type: General Level of consciousness: awake Pain management: pain level controlled Respiratory status: spontaneous breathing Cardiovascular status: stable Anesthetic complications: no       Last Vitals:  Vitals:   05/15/16 1341 05/15/16 1343  BP: (!) 98/55 127/63  Pulse: 69 70  Resp: 12   Temp: 36.6 C     Last Pain:  Vitals:   05/15/16 1341  TempSrc:   PainSc: 4                  Meesha Sek

## 2016-06-07 DIAGNOSIS — E1129 Type 2 diabetes mellitus with other diabetic kidney complication: Secondary | ICD-10-CM | POA: Diagnosis not present

## 2016-06-07 DIAGNOSIS — N186 End stage renal disease: Secondary | ICD-10-CM | POA: Diagnosis not present

## 2016-06-07 DIAGNOSIS — E876 Hypokalemia: Secondary | ICD-10-CM | POA: Diagnosis not present

## 2016-06-07 DIAGNOSIS — D631 Anemia in chronic kidney disease: Secondary | ICD-10-CM | POA: Diagnosis not present

## 2016-06-07 DIAGNOSIS — N2581 Secondary hyperparathyroidism of renal origin: Secondary | ICD-10-CM | POA: Diagnosis not present

## 2016-06-07 DIAGNOSIS — Z992 Dependence on renal dialysis: Secondary | ICD-10-CM | POA: Diagnosis not present

## 2016-06-09 DIAGNOSIS — N186 End stage renal disease: Secondary | ICD-10-CM | POA: Diagnosis not present

## 2016-06-09 DIAGNOSIS — E876 Hypokalemia: Secondary | ICD-10-CM | POA: Diagnosis not present

## 2016-06-09 DIAGNOSIS — E1129 Type 2 diabetes mellitus with other diabetic kidney complication: Secondary | ICD-10-CM | POA: Diagnosis not present

## 2016-06-09 DIAGNOSIS — N2581 Secondary hyperparathyroidism of renal origin: Secondary | ICD-10-CM | POA: Diagnosis not present

## 2016-06-09 DIAGNOSIS — Z992 Dependence on renal dialysis: Secondary | ICD-10-CM | POA: Diagnosis not present

## 2016-06-09 DIAGNOSIS — D631 Anemia in chronic kidney disease: Secondary | ICD-10-CM | POA: Diagnosis not present

## 2016-06-12 DIAGNOSIS — Z992 Dependence on renal dialysis: Secondary | ICD-10-CM | POA: Diagnosis not present

## 2016-06-12 DIAGNOSIS — E1129 Type 2 diabetes mellitus with other diabetic kidney complication: Secondary | ICD-10-CM | POA: Diagnosis not present

## 2016-06-12 DIAGNOSIS — D631 Anemia in chronic kidney disease: Secondary | ICD-10-CM | POA: Diagnosis not present

## 2016-06-12 DIAGNOSIS — E876 Hypokalemia: Secondary | ICD-10-CM | POA: Diagnosis not present

## 2016-06-12 DIAGNOSIS — N186 End stage renal disease: Secondary | ICD-10-CM | POA: Diagnosis not present

## 2016-06-12 DIAGNOSIS — N2581 Secondary hyperparathyroidism of renal origin: Secondary | ICD-10-CM | POA: Diagnosis not present

## 2016-06-13 ENCOUNTER — Other Ambulatory Visit: Payer: Self-pay | Admitting: General Practice

## 2016-06-13 MED ORDER — CLONAZEPAM 0.5 MG PO TABS: 0.5000 mg | ORAL_TABLET | Freq: Two times a day (BID) | ORAL | 1 refills | Status: DC | PRN

## 2016-06-13 NOTE — Telephone Encounter (Signed)
Medication filled to pharmacy as requested.   

## 2016-06-13 NOTE — Telephone Encounter (Signed)
Last OV 02/02/16 Clonazepam last filled 04/11/16 #30 with 0

## 2016-06-14 ENCOUNTER — Encounter (INDEPENDENT_AMBULATORY_CARE_PROVIDER_SITE_OTHER): Payer: Medicare Other | Admitting: Ophthalmology

## 2016-06-14 DIAGNOSIS — E1129 Type 2 diabetes mellitus with other diabetic kidney complication: Secondary | ICD-10-CM | POA: Diagnosis not present

## 2016-06-14 DIAGNOSIS — E876 Hypokalemia: Secondary | ICD-10-CM | POA: Diagnosis not present

## 2016-06-14 DIAGNOSIS — D631 Anemia in chronic kidney disease: Secondary | ICD-10-CM | POA: Diagnosis not present

## 2016-06-14 DIAGNOSIS — N2581 Secondary hyperparathyroidism of renal origin: Secondary | ICD-10-CM | POA: Diagnosis not present

## 2016-06-14 DIAGNOSIS — N186 End stage renal disease: Secondary | ICD-10-CM | POA: Diagnosis not present

## 2016-06-14 DIAGNOSIS — Z992 Dependence on renal dialysis: Secondary | ICD-10-CM | POA: Diagnosis not present

## 2016-06-16 DIAGNOSIS — D631 Anemia in chronic kidney disease: Secondary | ICD-10-CM | POA: Diagnosis not present

## 2016-06-16 DIAGNOSIS — N186 End stage renal disease: Secondary | ICD-10-CM | POA: Diagnosis not present

## 2016-06-16 DIAGNOSIS — Z992 Dependence on renal dialysis: Secondary | ICD-10-CM | POA: Diagnosis not present

## 2016-06-16 DIAGNOSIS — N2581 Secondary hyperparathyroidism of renal origin: Secondary | ICD-10-CM | POA: Diagnosis not present

## 2016-06-16 DIAGNOSIS — E876 Hypokalemia: Secondary | ICD-10-CM | POA: Diagnosis not present

## 2016-06-16 DIAGNOSIS — E1129 Type 2 diabetes mellitus with other diabetic kidney complication: Secondary | ICD-10-CM | POA: Diagnosis not present

## 2016-06-19 ENCOUNTER — Other Ambulatory Visit: Payer: Self-pay | Admitting: Cardiology

## 2016-06-19 DIAGNOSIS — D631 Anemia in chronic kidney disease: Secondary | ICD-10-CM | POA: Diagnosis not present

## 2016-06-19 DIAGNOSIS — E876 Hypokalemia: Secondary | ICD-10-CM | POA: Diagnosis not present

## 2016-06-19 DIAGNOSIS — E1129 Type 2 diabetes mellitus with other diabetic kidney complication: Secondary | ICD-10-CM | POA: Diagnosis not present

## 2016-06-19 DIAGNOSIS — N186 End stage renal disease: Secondary | ICD-10-CM | POA: Diagnosis not present

## 2016-06-19 DIAGNOSIS — N2581 Secondary hyperparathyroidism of renal origin: Secondary | ICD-10-CM | POA: Diagnosis not present

## 2016-06-19 DIAGNOSIS — Z992 Dependence on renal dialysis: Secondary | ICD-10-CM | POA: Diagnosis not present

## 2016-06-21 DIAGNOSIS — N2581 Secondary hyperparathyroidism of renal origin: Secondary | ICD-10-CM | POA: Diagnosis not present

## 2016-06-21 DIAGNOSIS — N186 End stage renal disease: Secondary | ICD-10-CM | POA: Diagnosis not present

## 2016-06-21 DIAGNOSIS — E1129 Type 2 diabetes mellitus with other diabetic kidney complication: Secondary | ICD-10-CM | POA: Diagnosis not present

## 2016-06-21 DIAGNOSIS — Z992 Dependence on renal dialysis: Secondary | ICD-10-CM | POA: Diagnosis not present

## 2016-06-21 DIAGNOSIS — D631 Anemia in chronic kidney disease: Secondary | ICD-10-CM | POA: Diagnosis not present

## 2016-06-21 DIAGNOSIS — E876 Hypokalemia: Secondary | ICD-10-CM | POA: Diagnosis not present

## 2016-06-22 ENCOUNTER — Encounter (INDEPENDENT_AMBULATORY_CARE_PROVIDER_SITE_OTHER): Payer: Medicare Other | Admitting: Ophthalmology

## 2016-06-22 DIAGNOSIS — E113311 Type 2 diabetes mellitus with moderate nonproliferative diabetic retinopathy with macular edema, right eye: Secondary | ICD-10-CM

## 2016-06-22 DIAGNOSIS — E113392 Type 2 diabetes mellitus with moderate nonproliferative diabetic retinopathy without macular edema, left eye: Secondary | ICD-10-CM | POA: Diagnosis not present

## 2016-06-22 DIAGNOSIS — I10 Essential (primary) hypertension: Secondary | ICD-10-CM

## 2016-06-22 DIAGNOSIS — H43813 Vitreous degeneration, bilateral: Secondary | ICD-10-CM | POA: Diagnosis not present

## 2016-06-22 DIAGNOSIS — H35033 Hypertensive retinopathy, bilateral: Secondary | ICD-10-CM | POA: Diagnosis not present

## 2016-06-22 DIAGNOSIS — E11311 Type 2 diabetes mellitus with unspecified diabetic retinopathy with macular edema: Secondary | ICD-10-CM | POA: Diagnosis not present

## 2016-06-23 DIAGNOSIS — E1129 Type 2 diabetes mellitus with other diabetic kidney complication: Secondary | ICD-10-CM | POA: Diagnosis not present

## 2016-06-23 DIAGNOSIS — N2581 Secondary hyperparathyroidism of renal origin: Secondary | ICD-10-CM | POA: Diagnosis not present

## 2016-06-23 DIAGNOSIS — E876 Hypokalemia: Secondary | ICD-10-CM | POA: Diagnosis not present

## 2016-06-23 DIAGNOSIS — D631 Anemia in chronic kidney disease: Secondary | ICD-10-CM | POA: Diagnosis not present

## 2016-06-23 DIAGNOSIS — N186 End stage renal disease: Secondary | ICD-10-CM | POA: Diagnosis not present

## 2016-06-23 DIAGNOSIS — Z992 Dependence on renal dialysis: Secondary | ICD-10-CM | POA: Diagnosis not present

## 2016-06-26 DIAGNOSIS — E1129 Type 2 diabetes mellitus with other diabetic kidney complication: Secondary | ICD-10-CM | POA: Diagnosis not present

## 2016-06-26 DIAGNOSIS — Z992 Dependence on renal dialysis: Secondary | ICD-10-CM | POA: Diagnosis not present

## 2016-06-26 DIAGNOSIS — N186 End stage renal disease: Secondary | ICD-10-CM | POA: Diagnosis not present

## 2016-06-26 DIAGNOSIS — D631 Anemia in chronic kidney disease: Secondary | ICD-10-CM | POA: Diagnosis not present

## 2016-06-26 DIAGNOSIS — N2581 Secondary hyperparathyroidism of renal origin: Secondary | ICD-10-CM | POA: Diagnosis not present

## 2016-06-26 DIAGNOSIS — E876 Hypokalemia: Secondary | ICD-10-CM | POA: Diagnosis not present

## 2016-06-28 ENCOUNTER — Ambulatory Visit: Payer: Medicare Other | Admitting: Cardiology

## 2016-06-28 ENCOUNTER — Encounter: Payer: Self-pay | Admitting: Cardiology

## 2016-06-28 ENCOUNTER — Ambulatory Visit (INDEPENDENT_AMBULATORY_CARE_PROVIDER_SITE_OTHER): Payer: Medicare Other | Admitting: Cardiology

## 2016-06-28 VITALS — BP 110/50 | HR 73 | Ht 65.0 in | Wt 141.0 lb

## 2016-06-28 DIAGNOSIS — I208 Other forms of angina pectoris: Secondary | ICD-10-CM

## 2016-06-28 DIAGNOSIS — I25700 Atherosclerosis of coronary artery bypass graft(s), unspecified, with unstable angina pectoris: Secondary | ICD-10-CM | POA: Diagnosis not present

## 2016-06-28 DIAGNOSIS — Z79899 Other long term (current) drug therapy: Secondary | ICD-10-CM | POA: Diagnosis not present

## 2016-06-28 DIAGNOSIS — E1129 Type 2 diabetes mellitus with other diabetic kidney complication: Secondary | ICD-10-CM | POA: Diagnosis not present

## 2016-06-28 DIAGNOSIS — Z992 Dependence on renal dialysis: Secondary | ICD-10-CM | POA: Diagnosis not present

## 2016-06-28 DIAGNOSIS — I2089 Other forms of angina pectoris: Secondary | ICD-10-CM

## 2016-06-28 DIAGNOSIS — I25119 Atherosclerotic heart disease of native coronary artery with unspecified angina pectoris: Secondary | ICD-10-CM

## 2016-06-28 DIAGNOSIS — I5042 Chronic combined systolic (congestive) and diastolic (congestive) heart failure: Secondary | ICD-10-CM

## 2016-06-28 DIAGNOSIS — I2510 Atherosclerotic heart disease of native coronary artery without angina pectoris: Secondary | ICD-10-CM | POA: Diagnosis not present

## 2016-06-28 DIAGNOSIS — E785 Hyperlipidemia, unspecified: Secondary | ICD-10-CM

## 2016-06-28 DIAGNOSIS — I214 Non-ST elevation (NSTEMI) myocardial infarction: Secondary | ICD-10-CM | POA: Diagnosis not present

## 2016-06-28 DIAGNOSIS — T82855D Stenosis of coronary artery stent, subsequent encounter: Secondary | ICD-10-CM

## 2016-06-28 DIAGNOSIS — N2581 Secondary hyperparathyroidism of renal origin: Secondary | ICD-10-CM | POA: Diagnosis not present

## 2016-06-28 DIAGNOSIS — D631 Anemia in chronic kidney disease: Secondary | ICD-10-CM | POA: Diagnosis not present

## 2016-06-28 DIAGNOSIS — I200 Unstable angina: Secondary | ICD-10-CM

## 2016-06-28 DIAGNOSIS — E876 Hypokalemia: Secondary | ICD-10-CM | POA: Diagnosis not present

## 2016-06-28 DIAGNOSIS — N186 End stage renal disease: Secondary | ICD-10-CM | POA: Diagnosis not present

## 2016-06-28 DIAGNOSIS — Z9861 Coronary angioplasty status: Secondary | ICD-10-CM | POA: Diagnosis not present

## 2016-06-28 MED ORDER — AMLODIPINE BESYLATE 5 MG PO TABS: 2.5000 mg | ORAL_TABLET | Freq: Every day | ORAL | 3 refills | Status: DC

## 2016-06-28 NOTE — Progress Notes (Signed)
PCP: [name] Minium, MD  Clinic Note: Chief Complaint  Patient presents with  . Hospitalization Follow-up    pt c/o DOE and occassional chest pain   . Coronary Artery Disease    recent NSTEMI - PTCA    HPI: [name] [name] is a 76 y.o. male with a PMH below who presents today for hospital follow-up for his known CAD with chronic angina and chronic in-stent restenosis issues on the RCA.[name] [name] was last seen in April prior to his hospitalization, and that time contemplating going forward with cardiac catheterization only to be beaten out by his presentation with NSTEMI .  Recent Hospitalizations:  05/18/2016 for non-STEMI --> cutting balloon PTCA of mRCA ISR  Studies Personally Reviewed - (if available, images/films reviewed: From Epic Chart or Care Everywhere)  Cath-PCI 05/21/2016: Culprit lesion was 95% in-stent restenosis in the mid RCA treated with cutting balloon PTCA. Also noted 100% occlusion of LAD and circumflex with 60% ramus. 50% proximal RCA.  Patent LIMA-LAD and SVG-OM.Distal LAD & OM2 75% after graft insertion  Interval History: [name] [name] returns today still noticing that he every now is having angina. He is may be taken 1 or 2 doses of nitroglycerin this week. Not necessarily associated with anything in particular. He does say that he is having a little bit more energy with higher blood pressure, but has noted more angina and not have the same amount of relief from the last PCI as he has in the past. Pretty much if he gets up to do just about anything, he will have exertional angina. He has no PND, orthopnea or edema. No palpitations or rapid irregular heartbeats. No sicca be/near syncope or TIA/amaurosis fugax.  Overall he continues to be fatigued and pretty much sedentary.  No melena, hematochezia, or epstaxis. No claudication.  ROS: A comprehensive was performed. Review of Systems  Constitutional: Positive for malaise/fatigue.  HENT: Negative for  congestion and nosebleeds.   Respiratory: Negative for cough and wheezing.   Cardiovascular:       Per history of present illness  Musculoskeletal: Positive for joint pain and myalgias.  Neurological: Negative for dizziness (Much less dizzy after dialysis).  Psychiatric/Behavioral: Positive for depression and memory loss. The patient is not nervous/anxious and does not have insomnia.   All other systems reviewed and are negative.   I have reviewed and (if needed) personally updated the patient's problem list, medications, allergies, past medical and surgical history, social and family history.   Past Medical History:  Diagnosis Date  . Anemia    Likely secondary to history of GI bleed  . Anginal pain (HCC)    Chronic stable  . Anxiety   . Arthritis   . BPH (benign prostatic hyperplasia)   . CAD (coronary artery disease) of bypass graft 2006, 2012   In 2006: Occluded SVG-OM noted (SVG-diagonal was previously occluded); 2012: Severe lesion in redo SVG-OM1 --> BMS PCI   . CAD in native artery; and the grafts 1992, 1997, 2002, 2006, 2012   CABG 1992 and redo CABG 2006  . CAD S/P percutaneous coronary angioplasty October 2012; Feb, Apr & Oct 2015; Feb 2016   a) s/p CABG 1992 and redo 2006. b) 10/12: NSTEMI: PCI to SVG-OM1 Integrity BMS 3.5 x 15 (3.85 mm) c) 2/15: UA - PCI mid RCA Xience Ap DES 2.75 x 15 (3.1 mm). d) 4/15 : UA - focal dRCA Resolute DES 2.25 x 8 (2.5 mm). e) 10/15: PCI to [facility]  ISR w/ post stent lesion - Promus P 2.75 x 16 (3.25 mm). f) 2/16: NSTEMI: CBA/PTCA of  mRCA ISR (3.5 mm post-dilation); g) 7/16 ISR RCA->CBA/PTCA, residual 20%.  . Carotid arterial disease ([place])    a) Duplex 05/2012: R BULB/PROX ICA: 50-69%, L BULB/PROX ICA: 0-49%. ;; 04/29/2014: 16-10% RICA, 96-04% LICA. Patetn vertebrals,  . Chronic low back pain   . Colon cancer [facility]) 2003   colectomy for CA. no recurrence . never required  radiation or chem  . Diabetes mellitus without complication ([place])   . Dyspnea     "when I get too much Fluid"  . End stage renal disease on dialysis [facility])    Dialyzes at [facility]: Tues/Th/Sat - left upper cavity AV fistula brachiocephalic  . Gout   . Heart murmur   . History of hiatal hernia   . History of Non-ST elevated myocardial infarction (non-STEMI) 1992, 2006, 2012; 02/2013  . Hyperlipidemia   . Hypertension   . Hypothyroidism    On supplementation  . Ischemic cardiomyopathy    a) EF 45-50% by echo 2015. b) EF 50% by cath 04/2014.  Marland Kitchen LBBB (left bundle branch block)    Chronic  . Peptic ulcer disease  2004  . Plavix resistance    a) P2Y12 264 in 02/2014 - put on Brilinta but did not tolerate due to SOB. b) cannot be on Effient due to history of stroke. c) Plavix increased to 75mg  BID and Pletal added 04/2014 due to ISR.  Marland Kitchen Pneumonia   . Skin cancer 02/1999   nose  . Stroke [facility]) 1998    Past Surgical History:  Procedure Laterality Date  . AORTIC ARCH ANGIOGRAPHY N/A 05/09/2016   Procedure: Aortic Arch Angiography;  Surgeon: Waynetta Sandy, MD;  Location: [place] CV [facility];  Service: Cardiovascular;  Laterality: N/A;  . APPENDECTOMY    . AV FISTULA REPAIR Left 05/2009  . CARDIAC CATHETERIZATION  10/16/2010   patent  LIMA to the LAD , PATENT  svg TO 2nd marginals ,occluded PLA with collaterals and SVG to the OM  had 90% stenosis  was txlge  bare - metal  3.5  Integrity ten  postdilate  36-37  mm    . CARDIAC CATHETERIZATION  03/22/2004   loss of both SVG,severe disease in prox and ostial  circ not amenable to invention. mod diease distal LAD  AFTER BYPASS GRAFT;-CVTS to evaluate   . CARDIAC CATHETERIZATION N/A 08/03/2014   Procedure: Left Heart Cath and Cors/Grafts Angiography;  Surgeon: Leonie Man, MD;  Location: [place] [facility];  Service: Cardiovascular;  Laterality: N/A;  . CARDIAC CATHETERIZATION N/A 08/03/2014   Procedure: Coronary Balloon Angioplasty;  Surgeon: Leonie Man, MD;  Location: [place] [facility] LAB;  Service:  Cardiovascular;: Aggressive Cutting Balloon - [place] Balloon PTCA of ISR site in mRCA (3 stent layer)  . CAROTID DOPPLER  05/23/2012   ABN CAROTID-- RGT BULB/PROX ICA mild to mod 50-60%;lft bulb/prox mild to mod 0-49%;left subclavian abn waveforms consistent with patients lft arm A/V fistula  . CATARACT EXTRACTION W/ INTRAOCULAR LENS  IMPLANT, BILATERAL Bilateral   . CHOLECYSTECTOMY  2009  . COLON RESECTION  2003   transverse and proximal descending w/ primar anastomosis  . COLON SURGERY  2003   "cancer"  . COLONOSCOPY    . CORONARY ANGIOPLASTY  04/03/1995   OM and Circ  . CORONARY ANGIOPLASTY  02/01/2000   CIRC  . CORONARY ANGIOPLASTY  02/09/14   Cutting Balloon  PTCA of mRCA ISR 99% - 3.5 mm  . CORONARY ARTERY BYPASS GRAFT  08/22/1990   INITIAL:LIMA to LAD, SVG to  Diagonal, SVG to OM  . CORONARY ARTERY BYPASS GRAFT  2006   SVG to OM1,SVG to OM2 with patent LIMA  to LAD and occluded vein  graft to the diagonal  and occluded vein grft to OM from  prior  surgery  . CORONARY ATHERECTOMY  05/21/2016   Successful PTCA and cutting balloon atherectomy of mid dominant RCA "in-stent restenosis of the lesion that had been intervened on close to 2 years ago with an excellent result.   . CORONARY BALLOON ANGIOPLASTY N/A 05/21/2016   Procedure: Coronary Balloon Angioplasty;  Surgeon: Lorretta Harp, MD;  Location: [place] [facility] LAB;  Service: Cardiovascular;  Laterality: N/A;  . DISTAL REVASCULARIZATION AND INTERVAL LIGATION (DRIL) Left 05/14/2016   Procedure: DISTAL REVASCULARIZATION AND INTERVAL LIGATION (DRIL) LEFT ARM;  Surgeon: Waynetta Sandy, MD;  Location: [place];  Service: Vascular;  Laterality: Left;  . DOPPLER ECHOCARDIOGRAPHY  01/25/2012   EF 50 to 55%  . EVENT MONITOR  01/23/2012-02/06/2012   SINUS ,LBBB,unifocal PVCs  . LEFT AND RIGHT HEART CATHETERIZATION WITH CORONARY/GRAFT ANGIOGRAM N/A 04/20/2014   Procedure: LEFT AND RIGHT HEART CATHETERIZATION WITH Beatrix Fetters;   Surgeon: Sherren Mocha, MD;  Location: [facility] CATH LAB;  Service: Cardiovascular;  Laterality: N/A;  . LEFT HEART CATH AND CORS/GRAFTS ANGIOGRAPHY N/A 05/21/2016   Procedure: LEFT HEART CATH AND CORS/GRAFTS ANGIOGRAPHY;  Surgeon: Lorretta Harp, MD;  Location: [place] CV LAB;  Service: Cardiovascular;  Laterality: N/A;  . LEFT HEART CATHETERIZATION WITH CORONARY ANGIOGRAM N/A 02/23/2013   Procedure: LEFT HEART CATHETERIZATION WITH CORONARY ANGIOGRAM;  Surgeon: Lorretta Harp, MD;  Location: [facility] CATH LAB;  Service: Cardiovascular;  Laterality: N/A;  . LEFT HEART CATHETERIZATION WITH CORONARY/GRAFT ANGIOGRAM N/A 04/30/2013   Procedure: LEFT HEART CATHETERIZATION WITH Beatrix Fetters;  Surgeon: Leonie Man, MD;  Location: [facility] CATH LAB;  Service: Cardiovascular;  Laterality: N/A;  . LEFT HEART CATHETERIZATION WITH CORONARY/GRAFT ANGIOGRAM N/A 10/15/2013   Procedure: LEFT HEART CATHETERIZATION WITH Beatrix Fetters;  Surgeon: Leonie Man, MD;  Location: [facility] CATH LAB;  Service: Cardiovascular;  Laterality: N/A;  . LEFT HEART CATHETERIZATION WITH CORONARY/GRAFT ANGIOGRAM N/A 02/09/2014   Procedure: LEFT HEART CATHETERIZATION WITH Beatrix Fetters;  Surgeon: Leonie Man, MD;  Location: [facility] CATH LAB;  Service: Cardiovascular;  Laterality: N/A;  . Lower Extremity Arterial Dopplers  October 2014   Calcified but non-occlusive peripheral arteries. No evidence of stenosis  . NM MYOCAR PERF WALL MOTION  10/12/2011   EF 54%,LV normal ; no signifiant ischemia  . PERCUTANEOUS CORONARY STENT INTERVENTION (PCI-S)  02/23/2013   mid RCA 80% & 70% - Xience 2.75 mm x 15 mm ( 3.17mm) ; LIMA-LAD patent, SVG-OM patent (stent patent); SVG-Diag patent.  Marland Kitchen PERCUTANEOUS CORONARY STENT INTERVENTION (PCI-S)  April 2015   dRCA - Integrity Resolute DES   2.25 mm x 97mm (2.5 mm)  . PERCUTANEOUS CORONARY STENT INTERVENTION (PCI-S)  Oct 15 2013   [place] Angina: mRCA ISR with post-stent stenosis --  Cuting PTCA & PCI Promus Premier DES 2.75 mm x 16 mm (3.25 mm)  . POSTERIOR LUMBAR FUSION    . REVISON OF ARTERIOVENOUS FISTULA Left 50/35/4656   Procedure: PLICATION OF LEFT UPPER ARM  ARTERIOVENOUS FISTULA  ANEURYSM;  Surgeon: Rosetta Posner, MD;  Location: [place];  Service: Vascular;  Laterality: Left;  . TRANSTHORACIC ECHOCARDIOGRAM  02/23/2013   Moderate concentric hypertrophy. EF 4500%. Septal bounce. Grade 2 diastolic dysfunction (pseudo-normal - severely elevated filling pressures) mild to moderate MR and moderate LA dilation to  . UPPER EXTREMITY ANGIOGRAPHY Left 05/09/2016   Procedure: Upper Extremity Angiography;  Surgeon: Waynetta Sandy, MD;  Location: [place] [facility];  Service: Cardiovascular;  Laterality: Left;    Current Meds  Medication Sig  . acetaminophen (TYLENOL) 500 MG tablet Take 1,000 mg by mouth 2 (two) times daily as needed for mild pain.   Marland Kitchen amLODipine (NORVASC) 5 MG tablet Take 0.5 tablets (2.5 mg total) by mouth daily. on non dialysis day  . aspirin 81 MG chewable tablet Chew 1 tablet (81 mg total) by mouth daily.  Marland Kitchen Besifloxacin HCl (BESIVANCE) 0.6 % SUSP Place 1 drop into both eyes See admin instructions. Use eye drops 2 times daily for 2 days following injection by Dr. Zigmund Daniel (every 10 weeks)  . cilostazol (PLETAL) 100 MG tablet TAKE ONE TABLET BY MOUTH TWICE DAILY  . clonazePAM (KLONOPIN) 0.5 MG tablet Take 1 tablet (0.5 mg total) by mouth 2 (two) times daily as needed. for anxiety  . clopidogrel (PLAVIX) 75 MG tablet Take 1 tablet (75 mg total) by mouth daily.  . ferric citrate (AURYXIA) 1 GM 210 MG(Fe) tablet Take 420 mg by mouth 3 (three) times daily with meals.  Marland Kitchen glucose blood (TRUE METRIX BLOOD GLUCOSE TEST) test strip 1 each by Other route as needed for other. Use as instructed to test sugars 2-3 times daily. Dx. E11.40  . isosorbide mononitrate (IMDUR) 120 MG 24 hr tablet TAKE ONE TABLET BY MOUTH ONCE DAILY  . levothyroxine (SYNTHROID,  LEVOTHROID) 125 MCG tablet TAKE ONE TABLET BY MOUTH BEFORE BREAKFAST  . lidocaine-prilocaine (EMLA) cream Apply 1 application topically See admin instructions. Apply prior to dialysis treatments on Tuesday, Thursday and Saturday  . MELATONIN PO Take 1 tablet by mouth at bedtime as needed (sleep).  . metoprolol succinate (TOPROL-XL) 25 MG 24 hr tablet Take 1 tablet (25 mg total) by mouth at bedtime. Take with or immediately following a meal.  . NITROSTAT 0.4 MG SL tablet PLACE 1 TABLET UNDER THE TONGUE EVERY 5 MINUTES AS NEEDED FOR CHEST PAIN (MAX 3 DOSES PER DAY)  . pantoprazole (PROTONIX) 40 MG tablet Take 1 tablet (40 mg total) by mouth daily.  . [DISCONTINUED] amLODipine (NORVASC) 5 MG tablet Take 1 tablet (5 mg total) by mouth daily. on non dialysis day    Allergies  Allergen Reactions  . Brilinta [Ticagrelor] Shortness Of Breath  . Shellfish Allergy Other (See Comments)    Causes gout flare-ups    Social History   Social History  . Marital status: Married    Spouse name: N/A  . Number of children: 3  . Years of education: N/A   Social History Main Topics  . Smoking status: Never Smoker  . Smokeless tobacco: Never Used  . Alcohol use No  . Drug use: No  . Sexual activity: No   Other Topics Concern  . None   Social History Narrative   Long-term patient of Dr. Rollene Fare.   Married father of 67, grandfather 12.   Never smoked.   Not exercising D2 hip and back pain & now Angina    family history includes Diabetes in his father and mother; Heart attack in his father and mother; Heart disease in his father and mother; Hypertension in his father and mother; Stroke in his paternal aunt and paternal uncle.  Wt Readings from Last 3 Encounters:  06/28/16 141 lb (64 kg)  06/01/16 145 lb 9.6 oz (66 kg)  06/01/16 146 lb (66.2 kg)    PHYSICAL EXAM BP (!) 110/50 (BP Location: Right Arm, Patient Position: Sitting, Cuff Size: Normal)   Pulse 73   Ht 5\' 5"  (1.651 m)   Wt 141 lb  (64 kg)   SpO2 98%   BMI 23.46 kg/m  General appearance: alert, cooperative, appears stated age, no distress. Thin/ frail. HEENT: /AT, EOMI, MMM, anicteric sclera Neck: no adenopathy, no carotid bruit and no JVD Lungs: clear to auscultation bilaterally, normal percussion bilaterally and non-labored Heart: regular rate and rhythm, S1 &S2 normal, no murmur, click, rub or gallop; non-displaced PMI Abdomen: soft, non-tender; bowel sounds normal; no masses,  no organomegaly; no HJR Extremities: extremities normal, atraumatic, no cyanosis, or edema Pulses: 2+ and symmetric; diminished pedal pulses Skin: normal, mobility and turgor normal and thin skin - pale Neurologic: Mental status: Alert & oriented x 3, thought content appropriate; non-focal exam.  Pleasant mood & affect.   Adult ECG Report n/a  Other studies Reviewed: Additional studies/ records that were reviewed today include:  Recent Labs:   Lab Results  Component Value Date   CHOL 162 06/28/2016   HDL 36 (L) 06/28/2016   LDLCALC 97 06/28/2016   LDLDIRECT 33.5 12/22/2012   TRIG 147 06/28/2016   CHOLHDL 4.5 06/28/2016     ASSESSMENT / PLAN: Problem List Items Addressed This Visit    CAD (coronary artery disease) of bypass graft - PCI to SVG-OM with BMS; PCI-mid RCA with a Xience DES - Primary (Chronic)   Relevant Medications   amLODipine (NORVASC) 5 MG tablet   CAD S/P percutaneous coronary angioplasty (Chronic)    Hopefully he will have a decent amount of time before the RCA lesion re-stenoses again. Unfortunately, the only real option would be redo Graft to the dRCA, but no conduits & he is not a good surgical candidate.  Lifelong DAPT with Pletal. Using CCB for anti-angina. Need to get better control of lipids.      Relevant Medications   amLODipine (NORVASC) 5 MG tablet   Chronic combined systolic and diastolic congestive heart failure, NYHA class 2 (HCC) (Chronic)   Relevant Medications   amLODipine (NORVASC) 5  MG tablet   Chronic stable angina (HCC) (Chronic)   Relevant Medications   amLODipine (NORVASC) 5 MG tablet   Coronary artery disease involving native coronary artery of native heart with angina pectoris (HCC) (Chronic)    With few exceptions, his significant angina events are related to the one RCA ISR location -> LIMA & SVG-Cx are still patent. I do think that we lost some of his anti-angina benefit when we converted from Carvedilol to Toprol, but there really was no good option b/c of baseline hypotension - most notably after HD. Now he is able to complete HD. With slightly better BP - will add back 2.5 mg of amlodipine on Pre HD evenings. Continue ASA/Plavix & Pletal along with High Dose Imdur & low dose Toprol.        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 LDL no longer @ goal.  Will need to increase back to 20 mg statin & recheck after next visit.      Relevant Medications   amLODipine (  NORVASC) 5 MG tablet   Non-ST elevation (NSTEMI) myocardial infarction (HCC) (Chronic)    Yet again another MI from her RCA lesion - with redo PTCA for ISR (this is one of his original BMS stent locations - most likely there is a fractured stent strut from a focal calcific nodule.  No real options besides continued PTCA at this point. .      Relevant Medications   amLODipine (NORVASC) 5 MG tablet     Daelon is very complicated - difficult situation to manage.  A good portion of the visit is allowing him to voice his issues - he is chronically ill with minimal options for care & has begun to feel hopeless.  > 45 min spent with the patient & >75% of the time is with direct counseling & motivation.  Current medicines are reviewed at length with the patient today. (+/- concerns) n/a The following changes have been made:  n/a  Patient Instructions  Medication Take 2.5 mg amlodipine ( 1/2 tablet of 5 mg ) the night before dialysis day    No other changes      Your physician recommends that you schedule a follow-up appointment in 3 months with DR HARDING.    Studies Ordered:   No orders of the defined types were placed in this encounter.     [name], M.D., M.S. Interventional Cardiologist   Pager # [phone] Phone # [phone] [address] [place], [place] [address]

## 2016-06-28 NOTE — Patient Instructions (Signed)
Medication Take 2.5 mg amlodipine ( 1/2 tablet of 5 mg ) the night before dialysis day    No other changes      Your physician recommends that you schedule a follow-up appointment in 3 months with DR HARDING.

## 2016-06-29 LAB — HEPATIC FUNCTION PANEL
ALT: 16 IU/L (ref 0–44)
AST: 14 IU/L (ref 0–40)
Albumin: 4.8 g/dL (ref 3.5–4.8)
Alkaline Phosphatase: 184 IU/L — ABNORMAL HIGH (ref 39–117)
BILIRUBIN TOTAL: 0.6 mg/dL (ref 0.0–1.2)
Bilirubin, Direct: 0.13 mg/dL (ref 0.00–0.40)
Total Protein: 7.6 g/dL (ref 6.0–8.5)

## 2016-06-29 LAB — LIPID PANEL
CHOLESTEROL TOTAL: 162 mg/dL (ref 100–199)
Chol/HDL Ratio: 4.5 ratio (ref 0.0–5.0)
HDL: 36 mg/dL — AB (ref >39)
LDL CALC: 97 mg/dL (ref 0–99)
TRIGLYCERIDES: 147 mg/dL (ref 0–149)
VLDL Cholesterol Cal: 29 mg/dL (ref 5–40)

## 2016-06-30 ENCOUNTER — Encounter: Payer: Self-pay | Admitting: Cardiology

## 2016-06-30 DIAGNOSIS — D631 Anemia in chronic kidney disease: Secondary | ICD-10-CM | POA: Diagnosis not present

## 2016-06-30 DIAGNOSIS — Z992 Dependence on renal dialysis: Secondary | ICD-10-CM | POA: Diagnosis not present

## 2016-06-30 DIAGNOSIS — E876 Hypokalemia: Secondary | ICD-10-CM | POA: Diagnosis not present

## 2016-06-30 DIAGNOSIS — N186 End stage renal disease: Secondary | ICD-10-CM | POA: Diagnosis not present

## 2016-06-30 DIAGNOSIS — E1129 Type 2 diabetes mellitus with other diabetic kidney complication: Secondary | ICD-10-CM | POA: Diagnosis not present

## 2016-06-30 DIAGNOSIS — N2581 Secondary hyperparathyroidism of renal origin: Secondary | ICD-10-CM | POA: Diagnosis not present

## 2016-06-30 NOTE — Assessment & Plan Note (Signed)
Yet again another MI from her RCA lesion - with redo PTCA for ISR (this is one of his original BMS stent locations - most likely there is a fractured stent strut from a focal calcific nodule.  No real options besides continued PTCA at this point. Marland Kitchen

## 2016-06-30 NOTE — Assessment & Plan Note (Signed)
With few exceptions, his significant angina events are related to the one RCA ISR location -> LIMA & SVG-Cx are still patent. I do think that we lost some of his anti-angina benefit when we converted from Carvedilol to Toprol, but there really was no good option b/c of baseline hypotension - most notably after HD. Now he is able to complete HD. With slightly better BP - will add back 2.5 mg of amlodipine on Pre HD evenings. Continue ASA/Plavix & Pletal along with High Dose Imdur & low dose Toprol.

## 2016-06-30 NOTE — Assessment & Plan Note (Signed)
Hopefully he will have a decent amount of time before the RCA lesion re-stenoses again. Unfortunately, the only real option would be redo Graft to the dRCA, but no conduits & he is not a good surgical candidate.  Lifelong DAPT with Pletal. Using CCB for anti-angina. Need to get better control of lipids.

## 2016-06-30 NOTE — Assessment & Plan Note (Signed)
Checked lipids today post-visit. LDL no longer @ goal.  Will need to increase back to 20 mg statin & recheck after next visit.

## 2016-07-03 DIAGNOSIS — N2581 Secondary hyperparathyroidism of renal origin: Secondary | ICD-10-CM | POA: Diagnosis not present

## 2016-07-03 DIAGNOSIS — E876 Hypokalemia: Secondary | ICD-10-CM | POA: Diagnosis not present

## 2016-07-03 DIAGNOSIS — D631 Anemia in chronic kidney disease: Secondary | ICD-10-CM | POA: Diagnosis not present

## 2016-07-03 DIAGNOSIS — Z992 Dependence on renal dialysis: Secondary | ICD-10-CM | POA: Diagnosis not present

## 2016-07-03 DIAGNOSIS — N186 End stage renal disease: Secondary | ICD-10-CM | POA: Diagnosis not present

## 2016-07-03 DIAGNOSIS — E1129 Type 2 diabetes mellitus with other diabetic kidney complication: Secondary | ICD-10-CM | POA: Diagnosis not present

## 2016-07-05 DIAGNOSIS — N186 End stage renal disease: Secondary | ICD-10-CM | POA: Diagnosis not present

## 2016-07-05 DIAGNOSIS — N2581 Secondary hyperparathyroidism of renal origin: Secondary | ICD-10-CM | POA: Diagnosis not present

## 2016-07-05 DIAGNOSIS — D631 Anemia in chronic kidney disease: Secondary | ICD-10-CM | POA: Diagnosis not present

## 2016-07-05 DIAGNOSIS — E876 Hypokalemia: Secondary | ICD-10-CM | POA: Diagnosis not present

## 2016-07-05 DIAGNOSIS — E1129 Type 2 diabetes mellitus with other diabetic kidney complication: Secondary | ICD-10-CM | POA: Diagnosis not present

## 2016-07-05 DIAGNOSIS — Z992 Dependence on renal dialysis: Secondary | ICD-10-CM | POA: Diagnosis not present

## 2016-07-07 DIAGNOSIS — E876 Hypokalemia: Secondary | ICD-10-CM | POA: Diagnosis not present

## 2016-07-07 DIAGNOSIS — E1129 Type 2 diabetes mellitus with other diabetic kidney complication: Secondary | ICD-10-CM | POA: Diagnosis not present

## 2016-07-07 DIAGNOSIS — Z992 Dependence on renal dialysis: Secondary | ICD-10-CM | POA: Diagnosis not present

## 2016-07-07 DIAGNOSIS — N186 End stage renal disease: Secondary | ICD-10-CM | POA: Diagnosis not present

## 2016-07-07 DIAGNOSIS — N2581 Secondary hyperparathyroidism of renal origin: Secondary | ICD-10-CM | POA: Diagnosis not present

## 2016-07-07 DIAGNOSIS — D631 Anemia in chronic kidney disease: Secondary | ICD-10-CM | POA: Diagnosis not present

## 2016-07-10 DIAGNOSIS — N186 End stage renal disease: Secondary | ICD-10-CM | POA: Diagnosis not present

## 2016-07-12 DIAGNOSIS — N186 End stage renal disease: Secondary | ICD-10-CM | POA: Diagnosis not present

## 2016-07-14 DIAGNOSIS — N186 End stage renal disease: Secondary | ICD-10-CM | POA: Diagnosis not present

## 2016-07-17 DIAGNOSIS — D631 Anemia in chronic kidney disease: Secondary | ICD-10-CM | POA: Diagnosis not present

## 2016-07-17 DIAGNOSIS — N2581 Secondary hyperparathyroidism of renal origin: Secondary | ICD-10-CM | POA: Diagnosis not present

## 2016-07-17 DIAGNOSIS — E876 Hypokalemia: Secondary | ICD-10-CM | POA: Diagnosis not present

## 2016-07-17 DIAGNOSIS — E1129 Type 2 diabetes mellitus with other diabetic kidney complication: Secondary | ICD-10-CM | POA: Diagnosis not present

## 2016-07-17 DIAGNOSIS — N186 End stage renal disease: Secondary | ICD-10-CM | POA: Diagnosis not present

## 2016-07-19 DIAGNOSIS — E876 Hypokalemia: Secondary | ICD-10-CM | POA: Diagnosis not present

## 2016-07-19 DIAGNOSIS — N186 End stage renal disease: Secondary | ICD-10-CM | POA: Diagnosis not present

## 2016-07-19 DIAGNOSIS — E1129 Type 2 diabetes mellitus with other diabetic kidney complication: Secondary | ICD-10-CM | POA: Diagnosis not present

## 2016-07-19 DIAGNOSIS — D631 Anemia in chronic kidney disease: Secondary | ICD-10-CM | POA: Diagnosis not present

## 2016-07-19 DIAGNOSIS — N2581 Secondary hyperparathyroidism of renal origin: Secondary | ICD-10-CM | POA: Diagnosis not present

## 2016-07-21 DIAGNOSIS — E1129 Type 2 diabetes mellitus with other diabetic kidney complication: Secondary | ICD-10-CM | POA: Diagnosis not present

## 2016-07-21 DIAGNOSIS — E876 Hypokalemia: Secondary | ICD-10-CM | POA: Diagnosis not present

## 2016-07-21 DIAGNOSIS — N186 End stage renal disease: Secondary | ICD-10-CM | POA: Diagnosis not present

## 2016-07-21 DIAGNOSIS — N2581 Secondary hyperparathyroidism of renal origin: Secondary | ICD-10-CM | POA: Diagnosis not present

## 2016-07-21 DIAGNOSIS — D631 Anemia in chronic kidney disease: Secondary | ICD-10-CM | POA: Diagnosis not present

## 2016-07-24 DIAGNOSIS — N2581 Secondary hyperparathyroidism of renal origin: Secondary | ICD-10-CM | POA: Diagnosis not present

## 2016-07-24 DIAGNOSIS — D631 Anemia in chronic kidney disease: Secondary | ICD-10-CM | POA: Diagnosis not present

## 2016-07-24 DIAGNOSIS — E1129 Type 2 diabetes mellitus with other diabetic kidney complication: Secondary | ICD-10-CM | POA: Diagnosis not present

## 2016-07-24 DIAGNOSIS — E876 Hypokalemia: Secondary | ICD-10-CM | POA: Diagnosis not present

## 2016-07-24 DIAGNOSIS — N186 End stage renal disease: Secondary | ICD-10-CM | POA: Diagnosis not present

## 2016-07-26 DIAGNOSIS — N2581 Secondary hyperparathyroidism of renal origin: Secondary | ICD-10-CM | POA: Diagnosis not present

## 2016-07-26 DIAGNOSIS — N186 End stage renal disease: Secondary | ICD-10-CM | POA: Diagnosis not present

## 2016-07-26 DIAGNOSIS — E876 Hypokalemia: Secondary | ICD-10-CM | POA: Diagnosis not present

## 2016-07-26 DIAGNOSIS — E1129 Type 2 diabetes mellitus with other diabetic kidney complication: Secondary | ICD-10-CM | POA: Diagnosis not present

## 2016-07-26 DIAGNOSIS — D631 Anemia in chronic kidney disease: Secondary | ICD-10-CM | POA: Diagnosis not present

## 2016-07-28 DIAGNOSIS — N2581 Secondary hyperparathyroidism of renal origin: Secondary | ICD-10-CM | POA: Diagnosis not present

## 2016-07-28 DIAGNOSIS — D631 Anemia in chronic kidney disease: Secondary | ICD-10-CM | POA: Diagnosis not present

## 2016-07-28 DIAGNOSIS — E876 Hypokalemia: Secondary | ICD-10-CM | POA: Diagnosis not present

## 2016-07-28 DIAGNOSIS — N186 End stage renal disease: Secondary | ICD-10-CM | POA: Diagnosis not present

## 2016-07-28 DIAGNOSIS — E1129 Type 2 diabetes mellitus with other diabetic kidney complication: Secondary | ICD-10-CM | POA: Diagnosis not present

## 2016-07-30 ENCOUNTER — Other Ambulatory Visit: Payer: Self-pay | Admitting: Cardiology

## 2016-07-30 NOTE — Telephone Encounter (Signed)
REFILL 

## 2016-07-31 DIAGNOSIS — E876 Hypokalemia: Secondary | ICD-10-CM | POA: Diagnosis not present

## 2016-07-31 DIAGNOSIS — N2581 Secondary hyperparathyroidism of renal origin: Secondary | ICD-10-CM | POA: Diagnosis not present

## 2016-07-31 DIAGNOSIS — N186 End stage renal disease: Secondary | ICD-10-CM | POA: Diagnosis not present

## 2016-07-31 DIAGNOSIS — D631 Anemia in chronic kidney disease: Secondary | ICD-10-CM | POA: Diagnosis not present

## 2016-07-31 DIAGNOSIS — E1129 Type 2 diabetes mellitus with other diabetic kidney complication: Secondary | ICD-10-CM | POA: Diagnosis not present

## 2016-08-02 DIAGNOSIS — E876 Hypokalemia: Secondary | ICD-10-CM | POA: Diagnosis not present

## 2016-08-02 DIAGNOSIS — E1129 Type 2 diabetes mellitus with other diabetic kidney complication: Secondary | ICD-10-CM | POA: Diagnosis not present

## 2016-08-02 DIAGNOSIS — N186 End stage renal disease: Secondary | ICD-10-CM | POA: Diagnosis not present

## 2016-08-02 DIAGNOSIS — D631 Anemia in chronic kidney disease: Secondary | ICD-10-CM | POA: Diagnosis not present

## 2016-08-02 DIAGNOSIS — N2581 Secondary hyperparathyroidism of renal origin: Secondary | ICD-10-CM | POA: Diagnosis not present

## 2016-08-04 DIAGNOSIS — E876 Hypokalemia: Secondary | ICD-10-CM | POA: Diagnosis not present

## 2016-08-04 DIAGNOSIS — D631 Anemia in chronic kidney disease: Secondary | ICD-10-CM | POA: Diagnosis not present

## 2016-08-04 DIAGNOSIS — N186 End stage renal disease: Secondary | ICD-10-CM | POA: Diagnosis not present

## 2016-08-04 DIAGNOSIS — E1129 Type 2 diabetes mellitus with other diabetic kidney complication: Secondary | ICD-10-CM | POA: Diagnosis not present

## 2016-08-04 DIAGNOSIS — N2581 Secondary hyperparathyroidism of renal origin: Secondary | ICD-10-CM | POA: Diagnosis not present

## 2016-08-07 DIAGNOSIS — E876 Hypokalemia: Secondary | ICD-10-CM | POA: Diagnosis not present

## 2016-08-07 DIAGNOSIS — Z992 Dependence on renal dialysis: Secondary | ICD-10-CM | POA: Diagnosis not present

## 2016-08-07 DIAGNOSIS — N2581 Secondary hyperparathyroidism of renal origin: Secondary | ICD-10-CM | POA: Diagnosis not present

## 2016-08-07 DIAGNOSIS — E1129 Type 2 diabetes mellitus with other diabetic kidney complication: Secondary | ICD-10-CM | POA: Diagnosis not present

## 2016-08-07 DIAGNOSIS — N186 End stage renal disease: Secondary | ICD-10-CM | POA: Diagnosis not present

## 2016-08-07 DIAGNOSIS — D631 Anemia in chronic kidney disease: Secondary | ICD-10-CM | POA: Diagnosis not present

## 2016-08-09 DIAGNOSIS — N2581 Secondary hyperparathyroidism of renal origin: Secondary | ICD-10-CM | POA: Diagnosis not present

## 2016-08-09 DIAGNOSIS — N186 End stage renal disease: Secondary | ICD-10-CM | POA: Diagnosis not present

## 2016-08-09 DIAGNOSIS — E1129 Type 2 diabetes mellitus with other diabetic kidney complication: Secondary | ICD-10-CM | POA: Diagnosis not present

## 2016-08-09 DIAGNOSIS — E876 Hypokalemia: Secondary | ICD-10-CM | POA: Diagnosis not present

## 2016-08-09 DIAGNOSIS — Z992 Dependence on renal dialysis: Secondary | ICD-10-CM | POA: Diagnosis not present

## 2016-08-09 DIAGNOSIS — D631 Anemia in chronic kidney disease: Secondary | ICD-10-CM | POA: Diagnosis not present

## 2016-08-11 DIAGNOSIS — N2581 Secondary hyperparathyroidism of renal origin: Secondary | ICD-10-CM | POA: Diagnosis not present

## 2016-08-11 DIAGNOSIS — N186 End stage renal disease: Secondary | ICD-10-CM | POA: Diagnosis not present

## 2016-08-11 DIAGNOSIS — D631 Anemia in chronic kidney disease: Secondary | ICD-10-CM | POA: Diagnosis not present

## 2016-08-11 DIAGNOSIS — E1129 Type 2 diabetes mellitus with other diabetic kidney complication: Secondary | ICD-10-CM | POA: Diagnosis not present

## 2016-08-11 DIAGNOSIS — Z992 Dependence on renal dialysis: Secondary | ICD-10-CM | POA: Diagnosis not present

## 2016-08-11 DIAGNOSIS — E876 Hypokalemia: Secondary | ICD-10-CM | POA: Diagnosis not present

## 2016-08-13 ENCOUNTER — Other Ambulatory Visit: Payer: Self-pay | Admitting: Cardiology

## 2016-08-13 ENCOUNTER — Ambulatory Visit: Payer: Medicare Other | Admitting: Family Medicine

## 2016-08-14 DIAGNOSIS — Z992 Dependence on renal dialysis: Secondary | ICD-10-CM | POA: Diagnosis not present

## 2016-08-14 DIAGNOSIS — N2581 Secondary hyperparathyroidism of renal origin: Secondary | ICD-10-CM | POA: Diagnosis not present

## 2016-08-14 DIAGNOSIS — E1129 Type 2 diabetes mellitus with other diabetic kidney complication: Secondary | ICD-10-CM | POA: Diagnosis not present

## 2016-08-14 DIAGNOSIS — N186 End stage renal disease: Secondary | ICD-10-CM | POA: Diagnosis not present

## 2016-08-14 DIAGNOSIS — D631 Anemia in chronic kidney disease: Secondary | ICD-10-CM | POA: Diagnosis not present

## 2016-08-14 DIAGNOSIS — E876 Hypokalemia: Secondary | ICD-10-CM | POA: Diagnosis not present

## 2016-08-16 DIAGNOSIS — Z992 Dependence on renal dialysis: Secondary | ICD-10-CM | POA: Diagnosis not present

## 2016-08-16 DIAGNOSIS — N186 End stage renal disease: Secondary | ICD-10-CM | POA: Diagnosis not present

## 2016-08-16 DIAGNOSIS — N2581 Secondary hyperparathyroidism of renal origin: Secondary | ICD-10-CM | POA: Diagnosis not present

## 2016-08-16 DIAGNOSIS — E1129 Type 2 diabetes mellitus with other diabetic kidney complication: Secondary | ICD-10-CM | POA: Diagnosis not present

## 2016-08-16 DIAGNOSIS — D631 Anemia in chronic kidney disease: Secondary | ICD-10-CM | POA: Diagnosis not present

## 2016-08-16 DIAGNOSIS — E876 Hypokalemia: Secondary | ICD-10-CM | POA: Diagnosis not present

## 2016-08-21 DIAGNOSIS — E1129 Type 2 diabetes mellitus with other diabetic kidney complication: Secondary | ICD-10-CM | POA: Diagnosis not present

## 2016-08-21 DIAGNOSIS — N186 End stage renal disease: Secondary | ICD-10-CM | POA: Diagnosis not present

## 2016-08-21 DIAGNOSIS — Z992 Dependence on renal dialysis: Secondary | ICD-10-CM | POA: Diagnosis not present

## 2016-08-21 DIAGNOSIS — N2581 Secondary hyperparathyroidism of renal origin: Secondary | ICD-10-CM | POA: Diagnosis not present

## 2016-08-21 DIAGNOSIS — D631 Anemia in chronic kidney disease: Secondary | ICD-10-CM | POA: Diagnosis not present

## 2016-08-21 DIAGNOSIS — E876 Hypokalemia: Secondary | ICD-10-CM | POA: Diagnosis not present

## 2016-08-22 ENCOUNTER — Other Ambulatory Visit: Payer: Self-pay | Admitting: Emergency Medicine

## 2016-08-22 MED ORDER — CLONAZEPAM 0.5 MG PO TABS: 0.5000 mg | ORAL_TABLET | Freq: Two times a day (BID) | ORAL | 1 refills | Status: DC | PRN

## 2016-08-22 NOTE — Telephone Encounter (Signed)
Clonazepam last rx 06/13/16 # 60 1 RF [facility]: 03/17/12 UDS:  Last OV: 02/02/16   Please advise

## 2016-08-22 NOTE — Telephone Encounter (Signed)
Rx faxed to the [facility].

## 2016-08-23 DIAGNOSIS — D631 Anemia in chronic kidney disease: Secondary | ICD-10-CM | POA: Diagnosis not present

## 2016-08-23 DIAGNOSIS — N186 End stage renal disease: Secondary | ICD-10-CM | POA: Diagnosis not present

## 2016-08-23 DIAGNOSIS — E1129 Type 2 diabetes mellitus with other diabetic kidney complication: Secondary | ICD-10-CM | POA: Diagnosis not present

## 2016-08-23 DIAGNOSIS — N2581 Secondary hyperparathyroidism of renal origin: Secondary | ICD-10-CM | POA: Diagnosis not present

## 2016-08-23 DIAGNOSIS — E876 Hypokalemia: Secondary | ICD-10-CM | POA: Diagnosis not present

## 2016-08-23 DIAGNOSIS — Z992 Dependence on renal dialysis: Secondary | ICD-10-CM | POA: Diagnosis not present

## 2016-08-25 DIAGNOSIS — E1129 Type 2 diabetes mellitus with other diabetic kidney complication: Secondary | ICD-10-CM | POA: Diagnosis not present

## 2016-08-25 DIAGNOSIS — Z992 Dependence on renal dialysis: Secondary | ICD-10-CM | POA: Diagnosis not present

## 2016-08-25 DIAGNOSIS — D631 Anemia in chronic kidney disease: Secondary | ICD-10-CM | POA: Diagnosis not present

## 2016-08-25 DIAGNOSIS — N186 End stage renal disease: Secondary | ICD-10-CM | POA: Diagnosis not present

## 2016-08-25 DIAGNOSIS — E876 Hypokalemia: Secondary | ICD-10-CM | POA: Diagnosis not present

## 2016-08-25 DIAGNOSIS — N2581 Secondary hyperparathyroidism of renal origin: Secondary | ICD-10-CM | POA: Diagnosis not present

## 2016-08-28 DIAGNOSIS — Z992 Dependence on renal dialysis: Secondary | ICD-10-CM | POA: Diagnosis not present

## 2016-08-28 DIAGNOSIS — E876 Hypokalemia: Secondary | ICD-10-CM | POA: Diagnosis not present

## 2016-08-28 DIAGNOSIS — N186 End stage renal disease: Secondary | ICD-10-CM | POA: Diagnosis not present

## 2016-08-28 DIAGNOSIS — E1129 Type 2 diabetes mellitus with other diabetic kidney complication: Secondary | ICD-10-CM | POA: Diagnosis not present

## 2016-08-28 DIAGNOSIS — D631 Anemia in chronic kidney disease: Secondary | ICD-10-CM | POA: Diagnosis not present

## 2016-08-28 DIAGNOSIS — N2581 Secondary hyperparathyroidism of renal origin: Secondary | ICD-10-CM | POA: Diagnosis not present

## 2016-08-30 DIAGNOSIS — E1129 Type 2 diabetes mellitus with other diabetic kidney complication: Secondary | ICD-10-CM | POA: Diagnosis not present

## 2016-08-30 DIAGNOSIS — D631 Anemia in chronic kidney disease: Secondary | ICD-10-CM | POA: Diagnosis not present

## 2016-08-30 DIAGNOSIS — N2581 Secondary hyperparathyroidism of renal origin: Secondary | ICD-10-CM | POA: Diagnosis not present

## 2016-08-30 DIAGNOSIS — E876 Hypokalemia: Secondary | ICD-10-CM | POA: Diagnosis not present

## 2016-08-30 DIAGNOSIS — N186 End stage renal disease: Secondary | ICD-10-CM | POA: Diagnosis not present

## 2016-08-30 DIAGNOSIS — Z992 Dependence on renal dialysis: Secondary | ICD-10-CM | POA: Diagnosis not present

## 2016-09-01 DIAGNOSIS — N2581 Secondary hyperparathyroidism of renal origin: Secondary | ICD-10-CM | POA: Diagnosis not present

## 2016-09-01 DIAGNOSIS — N186 End stage renal disease: Secondary | ICD-10-CM | POA: Diagnosis not present

## 2016-09-04 DIAGNOSIS — N2581 Secondary hyperparathyroidism of renal origin: Secondary | ICD-10-CM | POA: Diagnosis not present

## 2016-09-04 DIAGNOSIS — E1129 Type 2 diabetes mellitus with other diabetic kidney complication: Secondary | ICD-10-CM | POA: Diagnosis not present

## 2016-09-04 DIAGNOSIS — N186 End stage renal disease: Secondary | ICD-10-CM | POA: Diagnosis not present

## 2016-09-04 DIAGNOSIS — D631 Anemia in chronic kidney disease: Secondary | ICD-10-CM | POA: Diagnosis not present

## 2016-09-04 DIAGNOSIS — Z992 Dependence on renal dialysis: Secondary | ICD-10-CM | POA: Diagnosis not present

## 2016-09-04 DIAGNOSIS — E876 Hypokalemia: Secondary | ICD-10-CM | POA: Diagnosis not present

## 2016-09-06 DIAGNOSIS — Z992 Dependence on renal dialysis: Secondary | ICD-10-CM | POA: Diagnosis not present

## 2016-09-06 DIAGNOSIS — E1129 Type 2 diabetes mellitus with other diabetic kidney complication: Secondary | ICD-10-CM | POA: Diagnosis not present

## 2016-09-06 DIAGNOSIS — E876 Hypokalemia: Secondary | ICD-10-CM | POA: Diagnosis not present

## 2016-09-06 DIAGNOSIS — N2581 Secondary hyperparathyroidism of renal origin: Secondary | ICD-10-CM | POA: Diagnosis not present

## 2016-09-06 DIAGNOSIS — D631 Anemia in chronic kidney disease: Secondary | ICD-10-CM | POA: Diagnosis not present

## 2016-09-06 DIAGNOSIS — N186 End stage renal disease: Secondary | ICD-10-CM | POA: Diagnosis not present

## 2016-09-07 DIAGNOSIS — Z992 Dependence on renal dialysis: Secondary | ICD-10-CM | POA: Diagnosis not present

## 2016-09-07 DIAGNOSIS — N186 End stage renal disease: Secondary | ICD-10-CM | POA: Diagnosis not present

## 2016-09-07 DIAGNOSIS — E1129 Type 2 diabetes mellitus with other diabetic kidney complication: Secondary | ICD-10-CM | POA: Diagnosis not present

## 2016-09-08 DIAGNOSIS — E876 Hypokalemia: Secondary | ICD-10-CM | POA: Diagnosis not present

## 2016-09-08 DIAGNOSIS — Z23 Encounter for immunization: Secondary | ICD-10-CM | POA: Diagnosis not present

## 2016-09-08 DIAGNOSIS — N186 End stage renal disease: Secondary | ICD-10-CM | POA: Diagnosis not present

## 2016-09-08 DIAGNOSIS — E1129 Type 2 diabetes mellitus with other diabetic kidney complication: Secondary | ICD-10-CM | POA: Diagnosis not present

## 2016-09-08 DIAGNOSIS — N2581 Secondary hyperparathyroidism of renal origin: Secondary | ICD-10-CM | POA: Diagnosis not present

## 2016-09-08 DIAGNOSIS — D631 Anemia in chronic kidney disease: Secondary | ICD-10-CM | POA: Diagnosis not present

## 2016-09-11 DIAGNOSIS — E1129 Type 2 diabetes mellitus with other diabetic kidney complication: Secondary | ICD-10-CM | POA: Diagnosis not present

## 2016-09-11 DIAGNOSIS — N186 End stage renal disease: Secondary | ICD-10-CM | POA: Diagnosis not present

## 2016-09-11 DIAGNOSIS — D631 Anemia in chronic kidney disease: Secondary | ICD-10-CM | POA: Diagnosis not present

## 2016-09-11 DIAGNOSIS — E876 Hypokalemia: Secondary | ICD-10-CM | POA: Diagnosis not present

## 2016-09-11 DIAGNOSIS — Z23 Encounter for immunization: Secondary | ICD-10-CM | POA: Diagnosis not present

## 2016-09-11 DIAGNOSIS — N2581 Secondary hyperparathyroidism of renal origin: Secondary | ICD-10-CM | POA: Diagnosis not present

## 2016-09-13 DIAGNOSIS — N186 End stage renal disease: Secondary | ICD-10-CM | POA: Diagnosis not present

## 2016-09-13 DIAGNOSIS — N2581 Secondary hyperparathyroidism of renal origin: Secondary | ICD-10-CM | POA: Diagnosis not present

## 2016-09-15 DIAGNOSIS — N186 End stage renal disease: Secondary | ICD-10-CM | POA: Diagnosis not present

## 2016-09-15 DIAGNOSIS — N2581 Secondary hyperparathyroidism of renal origin: Secondary | ICD-10-CM | POA: Diagnosis not present

## 2016-09-17 ENCOUNTER — Other Ambulatory Visit: Payer: Self-pay | Admitting: Cardiology

## 2016-09-17 NOTE — Telephone Encounter (Signed)
Rx(s) sent to pharmacy electronically.  

## 2016-09-18 DIAGNOSIS — Z23 Encounter for immunization: Secondary | ICD-10-CM | POA: Diagnosis not present

## 2016-09-18 DIAGNOSIS — N186 End stage renal disease: Secondary | ICD-10-CM | POA: Diagnosis not present

## 2016-09-18 DIAGNOSIS — E876 Hypokalemia: Secondary | ICD-10-CM | POA: Diagnosis not present

## 2016-09-18 DIAGNOSIS — D631 Anemia in chronic kidney disease: Secondary | ICD-10-CM | POA: Diagnosis not present

## 2016-09-18 DIAGNOSIS — N2581 Secondary hyperparathyroidism of renal origin: Secondary | ICD-10-CM | POA: Diagnosis not present

## 2016-09-18 DIAGNOSIS — E1129 Type 2 diabetes mellitus with other diabetic kidney complication: Secondary | ICD-10-CM | POA: Diagnosis not present

## 2016-09-20 DIAGNOSIS — N2581 Secondary hyperparathyroidism of renal origin: Secondary | ICD-10-CM | POA: Diagnosis not present

## 2016-09-20 DIAGNOSIS — D631 Anemia in chronic kidney disease: Secondary | ICD-10-CM | POA: Diagnosis not present

## 2016-09-20 DIAGNOSIS — Z23 Encounter for immunization: Secondary | ICD-10-CM | POA: Diagnosis not present

## 2016-09-20 DIAGNOSIS — N186 End stage renal disease: Secondary | ICD-10-CM | POA: Diagnosis not present

## 2016-09-20 DIAGNOSIS — E1129 Type 2 diabetes mellitus with other diabetic kidney complication: Secondary | ICD-10-CM | POA: Diagnosis not present

## 2016-09-20 DIAGNOSIS — E876 Hypokalemia: Secondary | ICD-10-CM | POA: Diagnosis not present

## 2016-09-22 DIAGNOSIS — N186 End stage renal disease: Secondary | ICD-10-CM | POA: Diagnosis not present

## 2016-09-22 DIAGNOSIS — E1129 Type 2 diabetes mellitus with other diabetic kidney complication: Secondary | ICD-10-CM | POA: Diagnosis not present

## 2016-09-22 DIAGNOSIS — E876 Hypokalemia: Secondary | ICD-10-CM | POA: Diagnosis not present

## 2016-09-22 DIAGNOSIS — D631 Anemia in chronic kidney disease: Secondary | ICD-10-CM | POA: Diagnosis not present

## 2016-09-22 DIAGNOSIS — Z23 Encounter for immunization: Secondary | ICD-10-CM | POA: Diagnosis not present

## 2016-09-22 DIAGNOSIS — N2581 Secondary hyperparathyroidism of renal origin: Secondary | ICD-10-CM | POA: Diagnosis not present

## 2016-09-25 DIAGNOSIS — Z23 Encounter for immunization: Secondary | ICD-10-CM | POA: Diagnosis not present

## 2016-09-25 DIAGNOSIS — E876 Hypokalemia: Secondary | ICD-10-CM | POA: Diagnosis not present

## 2016-09-25 DIAGNOSIS — D631 Anemia in chronic kidney disease: Secondary | ICD-10-CM | POA: Diagnosis not present

## 2016-09-25 DIAGNOSIS — N2581 Secondary hyperparathyroidism of renal origin: Secondary | ICD-10-CM | POA: Diagnosis not present

## 2016-09-25 DIAGNOSIS — E1129 Type 2 diabetes mellitus with other diabetic kidney complication: Secondary | ICD-10-CM | POA: Diagnosis not present

## 2016-09-25 DIAGNOSIS — N186 End stage renal disease: Secondary | ICD-10-CM | POA: Diagnosis not present

## 2016-09-27 DIAGNOSIS — E1129 Type 2 diabetes mellitus with other diabetic kidney complication: Secondary | ICD-10-CM | POA: Diagnosis not present

## 2016-09-27 DIAGNOSIS — E876 Hypokalemia: Secondary | ICD-10-CM | POA: Diagnosis not present

## 2016-09-27 DIAGNOSIS — Z23 Encounter for immunization: Secondary | ICD-10-CM | POA: Diagnosis not present

## 2016-09-27 DIAGNOSIS — D631 Anemia in chronic kidney disease: Secondary | ICD-10-CM | POA: Diagnosis not present

## 2016-09-27 DIAGNOSIS — N186 End stage renal disease: Secondary | ICD-10-CM | POA: Diagnosis not present

## 2016-09-27 DIAGNOSIS — N2581 Secondary hyperparathyroidism of renal origin: Secondary | ICD-10-CM | POA: Diagnosis not present

## 2016-09-28 ENCOUNTER — Ambulatory Visit (INDEPENDENT_AMBULATORY_CARE_PROVIDER_SITE_OTHER): Payer: Medicare Other | Admitting: Cardiology

## 2016-09-28 ENCOUNTER — Encounter: Payer: Self-pay | Admitting: Cardiology

## 2016-09-28 VITALS — BP 130/52 | HR 79 | Ht 65.0 in | Wt 146.2 lb

## 2016-09-28 DIAGNOSIS — I953 Hypotension of hemodialysis: Secondary | ICD-10-CM | POA: Diagnosis not present

## 2016-09-28 DIAGNOSIS — Z9861 Coronary angioplasty status: Secondary | ICD-10-CM | POA: Diagnosis not present

## 2016-09-28 DIAGNOSIS — I200 Unstable angina: Secondary | ICD-10-CM | POA: Diagnosis not present

## 2016-09-28 DIAGNOSIS — I25708 Atherosclerosis of coronary artery bypass graft(s), unspecified, with other forms of angina pectoris: Secondary | ICD-10-CM | POA: Diagnosis not present

## 2016-09-28 DIAGNOSIS — I209 Angina pectoris, unspecified: Secondary | ICD-10-CM

## 2016-09-28 DIAGNOSIS — I2510 Atherosclerotic heart disease of native coronary artery without angina pectoris: Secondary | ICD-10-CM

## 2016-09-28 DIAGNOSIS — E785 Hyperlipidemia, unspecified: Secondary | ICD-10-CM | POA: Diagnosis not present

## 2016-09-28 DIAGNOSIS — I208 Other forms of angina pectoris: Secondary | ICD-10-CM

## 2016-09-28 NOTE — Progress Notes (Signed)
PCP: Midge Minium, MD  Clinic Note: Chief Complaint  Patient presents with  . Follow-up  . Coronary Artery Disease    chronic angina    HPI: [name] [name] is a 76 y.o. male with a PMH below who presents today for hospital follow-up for his known CAD with chronic angina and chronic in-stent restenosis issues on the RCA.Marland Kitchen 05/18/2016 for non-STEMI --> cutting balloon PTCA of mRCA ISR  [name] [name] was last seen in June 2018.  still noticing that he every now is having angina.   Recent Hospitalizations:  n/a  Studies Personally Reviewed - (if available, images/films reviewed: From Epic Chart or Care Everywhere)  No new studies  Interval History: [name] [name] seems to be doing better overall from a cardiac standpoint. He has only used NTG 1 time (only) since the last visit (1 time when he probably over did it out in the yard).  He is not having any rest angina or angina decubitus.  He does have more energy related to higher BP, but is still quite sedentary & unable to do much of anything.  He is trying to get out & do some yard work, but can only do things for ~5-10 min.   He still notes that if he gets up to do just about anything, he will have exertional angina. He has no PND, orthopnea or edema. No palpitations or rapid irregular heartbeats. No sicca be/near syncope or TIA/amaurosis fugax.  Overall he continues to be fatigued and is starting to mention getting tired - of HD, of angina, taking so many medications & just not being able to do anything.    Cardiovascular ROS: positive for - palpitations and exertional angina, fatigue negative for - edema, loss of consciousness, orthopnea, paroxysmal nocturnal dyspnea, rapid heart rate or syncope/near syncope or TIA/amaurosis fugax No melena, hematochezia, hematuria or epistaxis.  ++ bruising.  No claudication  ROS: A comprehensive was performed. Pertinent symptoms noted in HPI> Review of Systems  Constitutional: Positive for  malaise/fatigue.  HENT: Negative for congestion and nosebleeds.   Respiratory: Negative for cough and wheezing.   Cardiovascular:       Per history of present illness  Musculoskeletal: Positive for joint pain and myalgias.  Neurological: Negative for dizziness (Much less dizzy after dialysis).  Endo/Heme/Allergies: Bruises/bleeds easily.  Psychiatric/Behavioral: Positive for depression and memory loss. The patient is not nervous/anxious and does not have insomnia.   All other systems reviewed and are negative.   I have reviewed and (if needed) personally updated the patient's problem list, medications, allergies, past medical and surgical history, social and family history.   Past Medical History:  Diagnosis Date  . Anemia    Likely secondary to history of GI bleed  . Anginal pain (HCC)    Chronic stable  . Anxiety   . Arthritis   . BPH (benign prostatic hyperplasia)   . CAD (coronary artery disease) of bypass graft 2006, 2012   In 2006: Occluded SVG-OM noted (SVG-diagonal was previously occluded); 2012: Severe lesion in redo SVG-OM1 --> BMS PCI   . CAD in native artery; and the grafts 1992, 1997, 2002, 2006, 2012   CABG 1992 and redo CABG 2006  . CAD S/P percutaneous coronary angioplasty October 2012; Feb, Apr & Oct 2015; Feb 2016   a) s/p CABG 1992 and redo 2006. b) 10/12: NSTEMI: PCI to SVG-OM1 Integrity BMS 3.5 x 15 (3.85 mm) c) 2/15: UA - PCI mid RCA Xience Ap DES 2.75 x  15 (3.1 mm). d) 4/15 : UA - focal dRCA Resolute DES 2.25 x 8 (2.5 mm). e) 10/15: PCI to mRCA ISR w/ post stent lesion - Promus P 2.75 x 16 (3.25 mm). f) 2/16: NSTEMI: CBA/PTCA of  mRCA ISR (3.5 mm post-dilation); g) 7/16 ISR RCA->CBA/PTCA, residual 20%.  . Carotid arterial disease ([place])    a) Duplex 05/2012: R BULB/PROX ICA: 50-69%, L BULB/PROX ICA: 0-49%. ;; 04/29/2014: 97-67% RICA, 34-19% LICA. Patetn vertebrals,  . Chronic low back pain   . Colon cancer [facility]) 2003   colectomy for CA. no recurrence . never  required  radiation or chem  . Diabetes mellitus without complication ([place])   . Dyspnea    "when I get too much Fluid"  . End stage renal disease on dialysis [facility])    Dialyzes at [facility]: Tues/Th/Sat - left upper cavity AV fistula brachiocephalic  . Gout   . Heart murmur   . History of hiatal hernia   . History of Non-ST elevated myocardial infarction (non-STEMI) 1992, 2006, 2012; 02/2013  . Hyperlipidemia   . Hypertension   . Hypothyroidism    On supplementation  . Ischemic cardiomyopathy    a) EF 45-50% by echo 2015. b) EF 50% by cath 04/2014.  Marland Kitchen LBBB (left bundle branch block)    Chronic  . Peptic ulcer disease  2004  . Plavix resistance    a) P2Y12 264 in 02/2014 - put on Brilinta but did not tolerate due to SOB. b) cannot be on Effient due to history of stroke. c) Plavix increased to 75mg  BID and Pletal added 04/2014 due to ISR.  Marland Kitchen Pneumonia   . Skin cancer 02/1999   nose  . Stroke [facility]) 1998    Past Surgical History:  Procedure Laterality Date  . AORTIC ARCH ANGIOGRAPHY N/A 05/09/2016   Procedure: Aortic Arch Angiography;  Surgeon: Waynetta Sandy, MD;  Location: [place] CV LAB;  Service: Cardiovascular;  Laterality: N/A;  . APPENDECTOMY    . AV FISTULA REPAIR Left 05/2009  . CARDIAC CATHETERIZATION  10/16/2010   patent  LIMA to the LAD , PATENT  svg TO 2nd marginals ,occluded PLA with collaterals and SVG to the OM  had 90% stenosis  was txlge  bare - metal  3.5  Integrity ten  postdilate  36-37  mm    . CARDIAC CATHETERIZATION  03/22/2004   loss of both SVG,severe disease in prox and ostial  circ not amenable to invention. mod diease distal LAD  AFTER BYPASS GRAFT;-CVTS to evaluate   . CARDIAC CATHETERIZATION N/A 08/03/2014   Procedure: Left Heart Cath and Cors/Grafts Angiography;  Surgeon: Leonie Man, MD;  Location: [place] CV LAB;  Service: Cardiovascular;  Laterality: N/A;  . CARDIAC CATHETERIZATION N/A 08/03/2014   Procedure: Coronary Balloon  Angioplasty;  Surgeon: Leonie Man, MD;  Location: [place] CV LAB;  Service: Cardiovascular;: Aggressive Cutting Balloon - [place] Balloon PTCA of ISR site in mRCA (3 stent layer)  . CAROTID DOPPLER  05/23/2012   ABN CAROTID-- RGT BULB/PROX ICA mild to mod 50-60%;lft bulb/prox mild to mod 0-49%;left subclavian abn waveforms consistent with patients lft arm A/V fistula  . CATARACT EXTRACTION W/ INTRAOCULAR LENS  IMPLANT, BILATERAL Bilateral   . CHOLECYSTECTOMY  2009  . COLON RESECTION  2003   transverse and proximal descending w/ primar anastomosis  . COLON SURGERY  2003   "cancer"  . COLONOSCOPY    . CORONARY ANGIOPLASTY  04/03/1995  OM and Circ  . CORONARY ANGIOPLASTY  02/01/2000   CIRC  . CORONARY ANGIOPLASTY  02/09/14   Cutting Balloon PTCA of mRCA ISR 99% - 3.5 mm  . CORONARY ARTERY BYPASS GRAFT  08/22/1990   INITIAL:LIMA to LAD, SVG to  Diagonal, SVG to OM  . CORONARY ARTERY BYPASS GRAFT  2006   SVG to OM1,SVG to OM2 with patent LIMA  to LAD and occluded vein  graft to the diagonal  and occluded vein grft to OM from  prior  surgery  . CORONARY ATHERECTOMY  05/21/2016   Successful PTCA and cutting balloon atherectomy of mid dominant RCA "in-stent restenosis of the lesion that had been intervened on close to 2 years ago with an excellent result.   . CORONARY BALLOON ANGIOPLASTY N/A 05/21/2016   Procedure: Coronary Balloon Angioplasty;  Surgeon: Lorretta Harp, MD;  Location: [place] CV LAB;  Service: Cardiovascular;  Laterality: N/A;  . DISTAL REVASCULARIZATION AND INTERVAL LIGATION (DRIL) Left 05/14/2016   Procedure: DISTAL REVASCULARIZATION AND INTERVAL LIGATION (DRIL) LEFT ARM;  Surgeon: Waynetta Sandy, MD;  Location: [place];  Service: Vascular;  Laterality: Left;  . DOPPLER ECHOCARDIOGRAPHY  01/25/2012   EF 50 to 55%  . EVENT MONITOR  01/23/2012-02/06/2012   SINUS ,LBBB,unifocal PVCs  . LEFT AND RIGHT HEART CATHETERIZATION WITH CORONARY/GRAFT ANGIOGRAM N/A 04/20/2014    Procedure: LEFT AND RIGHT HEART CATHETERIZATION WITH Beatrix Fetters;  Surgeon: Sherren Mocha, MD;  Location: [facility] CATH LAB;  Service: Cardiovascular;  Laterality: N/A;  . LEFT HEART CATH AND CORS/GRAFTS ANGIOGRAPHY N/A 05/21/2016   Procedure: LEFT HEART CATH AND CORS/GRAFTS ANGIOGRAPHY;  Surgeon: Lorretta Harp, MD;  Location: [place] CV LAB;  Service: Cardiovascular;  Laterality: N/A;  . LEFT HEART CATHETERIZATION WITH CORONARY ANGIOGRAM N/A 02/23/2013   Procedure: LEFT HEART CATHETERIZATION WITH CORONARY ANGIOGRAM;  Surgeon: Lorretta Harp, MD;  Location: [facility] CATH LAB;  Service: Cardiovascular;  Laterality: N/A;  . LEFT HEART CATHETERIZATION WITH CORONARY/GRAFT ANGIOGRAM N/A 04/30/2013   Procedure: LEFT HEART CATHETERIZATION WITH Beatrix Fetters;  Surgeon: Leonie Man, MD;  Location: [facility] CATH LAB;  Service: Cardiovascular;  Laterality: N/A;  . LEFT HEART CATHETERIZATION WITH CORONARY/GRAFT ANGIOGRAM N/A 10/15/2013   Procedure: LEFT HEART CATHETERIZATION WITH Beatrix Fetters;  Surgeon: Leonie Man, MD;  Location: [facility] CATH LAB;  Service: Cardiovascular;  Laterality: N/A;  . LEFT HEART CATHETERIZATION WITH CORONARY/GRAFT ANGIOGRAM N/A 02/09/2014   Procedure: LEFT HEART CATHETERIZATION WITH Beatrix Fetters;  Surgeon: Leonie Man, MD;  Location: [facility] CATH LAB;  Service: Cardiovascular;  Laterality: N/A;  . Lower Extremity Arterial Dopplers  October 2014   Calcified but non-occlusive peripheral arteries. No evidence of stenosis  . NM MYOCAR PERF WALL MOTION  10/12/2011   EF 54%,LV normal ; no signifiant ischemia  . PERCUTANEOUS CORONARY STENT INTERVENTION (PCI-S)  02/23/2013   mid RCA 80% & 70% - Xience 2.75 mm x 15 mm ( 3.28mm) ; LIMA-LAD patent, SVG-OM patent (stent patent); SVG-Diag patent.  Marland Kitchen PERCUTANEOUS CORONARY STENT INTERVENTION (PCI-S)  April 2015   dRCA - Integrity Resolute DES   2.25 mm x 23mm (2.5 mm)  . PERCUTANEOUS CORONARY STENT INTERVENTION  (PCI-S)  Oct 15 2013   [place] Angina: mRCA ISR with post-stent stenosis -- Cuting PTCA & PCI Promus Premier DES 2.75 mm x 16 mm (3.25 mm)  . POSTERIOR LUMBAR FUSION    . REVISON OF ARTERIOVENOUS FISTULA Left 75/91/6384   Procedure: PLICATION OF LEFT UPPER ARM  ARTERIOVENOUS FISTULA  ANEURYSM;  Surgeon: Rosetta Posner, MD;  Location: [place];  Service: Vascular;  Laterality: Left;  . TRANSTHORACIC ECHOCARDIOGRAM  02/23/2013   Moderate concentric hypertrophy. EF 4500%. Septal bounce. Grade 2 diastolic dysfunction (pseudo-normal - severely elevated filling pressures) mild to moderate MR and moderate LA dilation to  . UPPER EXTREMITY ANGIOGRAPHY Left 05/09/2016   Procedure: Upper Extremity Angiography;  Surgeon: Waynetta Sandy, MD;  Location: [place] CV LAB;  Service: Cardiovascular;  Laterality: Left;    Cath-PCI 05/21/2016: Culprit lesion was 95% in-stent restenosis in the mid RCA treated with cutting balloon PTCA. Also noted 100% occlusion of LAD and circumflex with 60% ramus. 50% proximal RCA.  Patent LIMA-LAD and SVG-OM.Distal LAD & OM2 75% after graft insertion   Current Meds  Medication Sig  . acetaminophen (TYLENOL) 500 MG tablet Take 1,000 mg by mouth 2 (two) times daily as needed for mild pain.   Marland Kitchen amLODipine (NORVASC) 5 MG tablet Take 0.5 tablets (2.5 mg total) by mouth daily. on non dialysis day  . aspirin 81 MG chewable tablet Chew 1 tablet (81 mg total) by mouth daily.  Marland Kitchen atorvastatin (LIPITOR) 20 MG tablet Take by mouth 2 (two) times daily. Take 0.5 mg twice daily  . Besifloxacin HCl (BESIVANCE) 0.6 % SUSP Place 1 drop into both eyes See admin instructions. Use eye drops 2 times daily for 2 days following injection by Dr. Zigmund Daniel (every 10 weeks)  . cilostazol (PLETAL) 100 MG tablet TAKE ONE TABLET BY MOUTH TWICE DAILY  . clonazePAM (KLONOPIN) 0.5 MG tablet Take 1 tablet (0.5 mg total) by mouth 2 (two) times daily as needed. for anxiety  . clopidogrel (PLAVIX) 75 MG tablet  TAKE ONE TABLET BY MOUTH ONCE DAILY  . ferric citrate (AURYXIA) 1 GM 210 MG(Fe) tablet Take 420 mg by mouth 3 (three) times daily with meals.  Marland Kitchen glucose blood (TRUE METRIX BLOOD GLUCOSE TEST) test strip 1 each by Other route as needed for other. Use as instructed to test sugars 2-3 times daily. Dx. E11.40  . isosorbide mononitrate (IMDUR) 120 MG 24 hr tablet TAKE 1 TABLET BY MOUTH ONCE DAILY  . levothyroxine (SYNTHROID, LEVOTHROID) 125 MCG tablet TAKE ONE TABLET BY MOUTH BEFORE BREAKFAST  . lidocaine-prilocaine (EMLA) cream Apply 1 application topically See admin instructions. Apply prior to dialysis treatments on Tuesday, Thursday and Saturday  . MELATONIN PO Take 1 tablet by mouth at bedtime as needed (sleep).  . metoprolol tartrate (LOPRESSOR) 25 MG tablet Take 25 mg by mouth daily.  Marland Kitchen NITROSTAT 0.4 MG SL tablet PLACE 1 TABLET UNDER THE TONGUE EVERY 5 MINUTES AS NEEDED FOR CHEST PAIN (MAX 3 DOSES PER DAY)  . pantoprazole (PROTONIX) 40 MG tablet Take 1 tablet (40 mg total) by mouth daily.    Allergies  Allergen Reactions  . Brilinta [Ticagrelor] Shortness Of Breath  . Shellfish Allergy Other (See Comments)    Causes gout flare-ups    Social History   Social History  . Marital status: Married    Spouse name: N/A  . Number of children: 3  . Years of education: N/A   Social History Main Topics  . Smoking status: Never Smoker  . Smokeless tobacco: Never Used  . Alcohol use No  . Drug use: No  . Sexual activity: No   Other Topics Concern  . None   Social History Narrative   Long-term patient of Dr. Rollene Fare.   Married father of 30, grandfather 26.   Never smoked.  Not exercising D2 hip and back pain & now Angina    family history includes Diabetes in his father and mother; Heart attack in his father and mother; Heart disease in his father and mother; Hypertension in his father and mother; Stroke in his paternal aunt and paternal uncle.  Wt Readings from Last 3 Encounters:    09/28/16 146 lb 3.2 oz (66.3 kg)  06/28/16 141 lb (64 kg)  06/01/16 145 lb 9.6 oz (66 kg)    PHYSICAL EXAM BP (!) 130/52   Pulse 79   Ht 5\' 5"  (1.651 m)   Wt 146 lb 3.2 oz (66.3 kg)   SpO2 99%   BMI 24.33 kg/m   Physical Exam  Constitutional: He is oriented to person, place, and time. No distress.  He continues to look thin and frail.  Chronically ill, but in no acute distress.  HENT:  Head: Normocephalic and atraumatic.  Continue to lose hair  Eyes: EOM are normal.  Neck: Neck supple. No hepatojugular reflux and no JVD present. Carotid bruit is not present (More consistent with left subclavian bruit fromm his fistula).  Cardiovascular: Normal rate and regular rhythm.   Occasional extrasystoles are present. PMI is not displaced.  Exam reveals distant heart sounds and decreased pulses (L wrist (but palpable)). Exam reveals no gallop.   No murmur (can hear a holosystolic bruit throughout the precordium that is most likely related to his fistula) [name]. Pulmonary/Chest: Effort normal. No respiratory distress. He has no wheezes. He has no rales.  Abdominal: Soft. Bowel sounds are normal. He exhibits no distension. There is no tenderness. There is no rebound.  Musculoskeletal: Normal range of motion. He exhibits no edema.  Neurological: He is alert and oriented to person, place, and time.  Skin: Skin is warm and dry. No erythema. There is pallor.  Diffuse bruising on hands & forearms.  Psychiatric: He has a normal mood and affect. His behavior is normal. Judgment and thought content normal.    Adult ECG Report n/a  Other studies Reviewed: Additional studies/ records that were reviewed today include:  Recent Labs:   Lab Results  Component Value Date   CHOL 162 06/28/2016   HDL 36 (L) 06/28/2016   LDLCALC 97 06/28/2016   LDLDIRECT 33.5 12/22/2012   TRIG 147 06/28/2016   CHOLHDL 4.5 06/28/2016    ASSESSMENT / PLAN: Problem List Items Addressed This Visit    CAD (coronary  artery disease) of bypass graft - PCI to SVG-OM with BMS; PCI-mid RCA with a Xience DES (Chronic)    Now he has patent SVG-OM & LIMA as well as the heavily calcified & stented RCA -> ~6 months out from most recent PTCA for ISR.  Unfortunately, hypotension has made treating his angina difficult. Tolerating low dose Toprol & current dose of Imdur.  Doing better with low dose amlodipine.  He is definitely on lowe Beta Blocker level & is starting to have some palpitations -- OK to take additional 1/2 tab of metoprolol PRN.        Relevant Medications   atorvastatin (LIPITOR) 20 MG tablet   metoprolol tartrate (LOPRESSOR) 25 MG tablet   CAD S/P percutaneous coronary angioplasty (Chronic)    He did well for almost 2 years after his last PTCA for RCA ISR, he seems to be doing well after this most recent event as well.  Continue lifelong DAPT + Pletal.  On combination of low dose metoprolol & amlodipine as well as Imdur -- only 1  x taking additional NTG.  Need to continue to encourage staying active - even if it means taking additional NTG.      Relevant Medications   atorvastatin (LIPITOR) 20 MG tablet   metoprolol tartrate (LOPRESSOR) 25 MG tablet   Chronic stable angina (HCC) - Primary (Chronic)    Overall improved - no longer having angina decubitus. Will continue with current Rx - Imdur, amlodipine & Toprol.      Relevant Medications   atorvastatin (LIPITOR) 20 MG tablet   metoprolol tartrate (LOPRESSOR) 25 MG tablet   Hyperlipidemia with target LDL less than 70 (Chronic)    Taking 1/2 tab BID - LDL still not at goal. Will wait 1 more visit to recheck - just increased dose. May need to convert to rosuvastatin.      Relevant Medications   atorvastatin (LIPITOR) 20 MG tablet   metoprolol tartrate (LOPRESSOR) 25 MG tablet   Hypotension of hemodialysis (Chronic)    Still an issue, but much better than before.   Still exhausted after HD, but BP drops are less prominent.       Relevant Medications   atorvastatin (LIPITOR) 20 MG tablet   metoprolol tartrate (LOPRESSOR) 25 MG tablet      Current medicines are reviewed at length with the patient today. (+/- concerns) n/a The following changes have been made: n/a  Patient Instructions  INSTRUCTIONS   If palpations become frequent may take your metoprolol dose earlier, if you have taken doses for the day , you may take 1/2 tablet if needed.      Your physician wants you to follow-up in 4 months with Dr Ellyn Hack. You will receive a reminder letter in the mail two months in advance. If you don't receive a letter, please call our office to schedule the follow-up appointment.   If you need a refill on your cardiac medications before your next appointment, please call your pharmacy.    Studies Ordered:   No orders of the defined types were placed in this encounter.     [name], M.D., M.S. Interventional Cardiologist   Pager # [phone] Phone # [phone] [address] [place], [place] [address]

## 2016-09-28 NOTE — Patient Instructions (Signed)
INSTRUCTIONS   If palpations become frequent may take your metoprolol dose earlier, if you have taken doses for the day , you may take 1/2 tablet if needed.      Your physician wants you to follow-up in 4 months with Dr Ellyn Hack. You will receive a reminder letter in the mail two months in advance. If you don't receive a letter, please call our office to schedule the follow-up appointment.   If you need a refill on your cardiac medications before your next appointment, please call your pharmacy.

## 2016-09-29 DIAGNOSIS — N186 End stage renal disease: Secondary | ICD-10-CM | POA: Diagnosis not present

## 2016-09-29 DIAGNOSIS — N2581 Secondary hyperparathyroidism of renal origin: Secondary | ICD-10-CM | POA: Diagnosis not present

## 2016-09-29 DIAGNOSIS — Z23 Encounter for immunization: Secondary | ICD-10-CM | POA: Diagnosis not present

## 2016-09-29 DIAGNOSIS — E876 Hypokalemia: Secondary | ICD-10-CM | POA: Diagnosis not present

## 2016-09-29 DIAGNOSIS — E1129 Type 2 diabetes mellitus with other diabetic kidney complication: Secondary | ICD-10-CM | POA: Diagnosis not present

## 2016-09-29 DIAGNOSIS — D631 Anemia in chronic kidney disease: Secondary | ICD-10-CM | POA: Diagnosis not present

## 2016-09-30 ENCOUNTER — Encounter: Payer: Self-pay | Admitting: Cardiology

## 2016-09-30 NOTE — Assessment & Plan Note (Addendum)
He did well for almost 2 years after his last PTCA for RCA ISR, he seems to be doing well after this most recent event as well.  Continue lifelong DAPT + Pletal.  On combination of low dose metoprolol & amlodipine as well as Imdur -- only 1 x taking additional NTG.  Need to continue to encourage staying active - even if it means taking additional NTG.

## 2016-09-30 NOTE — Assessment & Plan Note (Signed)
Taking 1/2 tab BID - LDL still not at goal. Will wait 1 more visit to recheck - just increased dose. May need to convert to rosuvastatin.

## 2016-09-30 NOTE — Assessment & Plan Note (Signed)
Now he has patent SVG-OM & LIMA as well as the heavily calcified & stented RCA -> ~6 months out from most recent PTCA for ISR.  Unfortunately, hypotension has made treating his angina difficult. Tolerating low dose Toprol & current dose of Imdur.  Doing better with low dose amlodipine.  He is definitely on lowe Beta Blocker level & is starting to have some palpitations -- OK to take additional 1/2 tab of metoprolol PRN.

## 2016-09-30 NOTE — Assessment & Plan Note (Signed)
Overall improved - no longer having angina decubitus. Will continue with current Rx - Imdur, amlodipine & Toprol.

## 2016-09-30 NOTE — Assessment & Plan Note (Signed)
Still an issue, but much better than before.   Still exhausted after HD, but BP drops are less prominent.

## 2016-10-02 DIAGNOSIS — N2581 Secondary hyperparathyroidism of renal origin: Secondary | ICD-10-CM | POA: Diagnosis not present

## 2016-10-02 DIAGNOSIS — Z23 Encounter for immunization: Secondary | ICD-10-CM | POA: Diagnosis not present

## 2016-10-02 DIAGNOSIS — N186 End stage renal disease: Secondary | ICD-10-CM | POA: Diagnosis not present

## 2016-10-02 DIAGNOSIS — D631 Anemia in chronic kidney disease: Secondary | ICD-10-CM | POA: Diagnosis not present

## 2016-10-02 DIAGNOSIS — E876 Hypokalemia: Secondary | ICD-10-CM | POA: Diagnosis not present

## 2016-10-02 DIAGNOSIS — E1129 Type 2 diabetes mellitus with other diabetic kidney complication: Secondary | ICD-10-CM | POA: Diagnosis not present

## 2016-10-04 DIAGNOSIS — E876 Hypokalemia: Secondary | ICD-10-CM | POA: Diagnosis not present

## 2016-10-04 DIAGNOSIS — E1129 Type 2 diabetes mellitus with other diabetic kidney complication: Secondary | ICD-10-CM | POA: Diagnosis not present

## 2016-10-04 DIAGNOSIS — D631 Anemia in chronic kidney disease: Secondary | ICD-10-CM | POA: Diagnosis not present

## 2016-10-04 DIAGNOSIS — N2581 Secondary hyperparathyroidism of renal origin: Secondary | ICD-10-CM | POA: Diagnosis not present

## 2016-10-04 DIAGNOSIS — Z23 Encounter for immunization: Secondary | ICD-10-CM | POA: Diagnosis not present

## 2016-10-04 DIAGNOSIS — N186 End stage renal disease: Secondary | ICD-10-CM | POA: Diagnosis not present

## 2016-10-06 DIAGNOSIS — D631 Anemia in chronic kidney disease: Secondary | ICD-10-CM | POA: Diagnosis not present

## 2016-10-06 DIAGNOSIS — Z23 Encounter for immunization: Secondary | ICD-10-CM | POA: Diagnosis not present

## 2016-10-06 DIAGNOSIS — E876 Hypokalemia: Secondary | ICD-10-CM | POA: Diagnosis not present

## 2016-10-06 DIAGNOSIS — N2581 Secondary hyperparathyroidism of renal origin: Secondary | ICD-10-CM | POA: Diagnosis not present

## 2016-10-06 DIAGNOSIS — E1129 Type 2 diabetes mellitus with other diabetic kidney complication: Secondary | ICD-10-CM | POA: Diagnosis not present

## 2016-10-06 DIAGNOSIS — N186 End stage renal disease: Secondary | ICD-10-CM | POA: Diagnosis not present

## 2016-10-07 DIAGNOSIS — Z992 Dependence on renal dialysis: Secondary | ICD-10-CM | POA: Diagnosis not present

## 2016-10-07 DIAGNOSIS — N186 End stage renal disease: Secondary | ICD-10-CM | POA: Diagnosis not present

## 2016-10-07 DIAGNOSIS — E1129 Type 2 diabetes mellitus with other diabetic kidney complication: Secondary | ICD-10-CM | POA: Diagnosis not present

## 2016-10-08 ENCOUNTER — Other Ambulatory Visit: Payer: Self-pay | Admitting: Cardiology

## 2016-10-09 DIAGNOSIS — E876 Hypokalemia: Secondary | ICD-10-CM | POA: Diagnosis not present

## 2016-10-09 DIAGNOSIS — N186 End stage renal disease: Secondary | ICD-10-CM | POA: Diagnosis not present

## 2016-10-09 DIAGNOSIS — N2581 Secondary hyperparathyroidism of renal origin: Secondary | ICD-10-CM | POA: Diagnosis not present

## 2016-10-09 DIAGNOSIS — D631 Anemia in chronic kidney disease: Secondary | ICD-10-CM | POA: Diagnosis not present

## 2016-10-09 DIAGNOSIS — E1129 Type 2 diabetes mellitus with other diabetic kidney complication: Secondary | ICD-10-CM | POA: Diagnosis not present

## 2016-10-10 ENCOUNTER — Ambulatory Visit: Payer: Medicare Other | Admitting: Family Medicine

## 2016-10-10 NOTE — Telephone Encounter (Signed)
Rx refill sent to pharmacy. 

## 2016-10-11 DIAGNOSIS — N2581 Secondary hyperparathyroidism of renal origin: Secondary | ICD-10-CM | POA: Diagnosis not present

## 2016-10-11 DIAGNOSIS — D631 Anemia in chronic kidney disease: Secondary | ICD-10-CM | POA: Diagnosis not present

## 2016-10-11 DIAGNOSIS — N186 End stage renal disease: Secondary | ICD-10-CM | POA: Diagnosis not present

## 2016-10-11 DIAGNOSIS — E876 Hypokalemia: Secondary | ICD-10-CM | POA: Diagnosis not present

## 2016-10-11 DIAGNOSIS — E1129 Type 2 diabetes mellitus with other diabetic kidney complication: Secondary | ICD-10-CM | POA: Diagnosis not present

## 2016-10-13 DIAGNOSIS — N186 End stage renal disease: Secondary | ICD-10-CM | POA: Diagnosis not present

## 2016-10-13 DIAGNOSIS — D631 Anemia in chronic kidney disease: Secondary | ICD-10-CM | POA: Diagnosis not present

## 2016-10-13 DIAGNOSIS — E876 Hypokalemia: Secondary | ICD-10-CM | POA: Diagnosis not present

## 2016-10-13 DIAGNOSIS — E1129 Type 2 diabetes mellitus with other diabetic kidney complication: Secondary | ICD-10-CM | POA: Diagnosis not present

## 2016-10-13 DIAGNOSIS — N2581 Secondary hyperparathyroidism of renal origin: Secondary | ICD-10-CM | POA: Diagnosis not present

## 2016-10-16 DIAGNOSIS — N186 End stage renal disease: Secondary | ICD-10-CM | POA: Diagnosis not present

## 2016-10-16 DIAGNOSIS — N2581 Secondary hyperparathyroidism of renal origin: Secondary | ICD-10-CM | POA: Diagnosis not present

## 2016-10-16 DIAGNOSIS — E876 Hypokalemia: Secondary | ICD-10-CM | POA: Diagnosis not present

## 2016-10-16 DIAGNOSIS — D631 Anemia in chronic kidney disease: Secondary | ICD-10-CM | POA: Diagnosis not present

## 2016-10-16 DIAGNOSIS — E1129 Type 2 diabetes mellitus with other diabetic kidney complication: Secondary | ICD-10-CM | POA: Diagnosis not present

## 2016-10-18 DIAGNOSIS — N2581 Secondary hyperparathyroidism of renal origin: Secondary | ICD-10-CM | POA: Diagnosis not present

## 2016-10-18 DIAGNOSIS — E876 Hypokalemia: Secondary | ICD-10-CM | POA: Diagnosis not present

## 2016-10-18 DIAGNOSIS — E1129 Type 2 diabetes mellitus with other diabetic kidney complication: Secondary | ICD-10-CM | POA: Diagnosis not present

## 2016-10-18 DIAGNOSIS — N186 End stage renal disease: Secondary | ICD-10-CM | POA: Diagnosis not present

## 2016-10-18 DIAGNOSIS — D631 Anemia in chronic kidney disease: Secondary | ICD-10-CM | POA: Diagnosis not present

## 2016-10-19 ENCOUNTER — Ambulatory Visit (INDEPENDENT_AMBULATORY_CARE_PROVIDER_SITE_OTHER): Payer: Medicare Other | Admitting: Ophthalmology

## 2016-10-19 DIAGNOSIS — E11311 Type 2 diabetes mellitus with unspecified diabetic retinopathy with macular edema: Secondary | ICD-10-CM

## 2016-10-19 DIAGNOSIS — E113392 Type 2 diabetes mellitus with moderate nonproliferative diabetic retinopathy without macular edema, left eye: Secondary | ICD-10-CM | POA: Diagnosis not present

## 2016-10-19 DIAGNOSIS — E113311 Type 2 diabetes mellitus with moderate nonproliferative diabetic retinopathy with macular edema, right eye: Secondary | ICD-10-CM | POA: Diagnosis not present

## 2016-10-19 DIAGNOSIS — H35033 Hypertensive retinopathy, bilateral: Secondary | ICD-10-CM

## 2016-10-19 DIAGNOSIS — H43813 Vitreous degeneration, bilateral: Secondary | ICD-10-CM | POA: Diagnosis not present

## 2016-10-19 DIAGNOSIS — I10 Essential (primary) hypertension: Secondary | ICD-10-CM

## 2016-10-19 LAB — HM DIABETES EYE EXAM

## 2016-10-20 DIAGNOSIS — E876 Hypokalemia: Secondary | ICD-10-CM | POA: Diagnosis not present

## 2016-10-20 DIAGNOSIS — N186 End stage renal disease: Secondary | ICD-10-CM | POA: Diagnosis not present

## 2016-10-20 DIAGNOSIS — N2581 Secondary hyperparathyroidism of renal origin: Secondary | ICD-10-CM | POA: Diagnosis not present

## 2016-10-20 DIAGNOSIS — E1129 Type 2 diabetes mellitus with other diabetic kidney complication: Secondary | ICD-10-CM | POA: Diagnosis not present

## 2016-10-20 DIAGNOSIS — D631 Anemia in chronic kidney disease: Secondary | ICD-10-CM | POA: Diagnosis not present

## 2016-10-22 ENCOUNTER — Encounter: Payer: Self-pay | Admitting: Family Medicine

## 2016-10-22 ENCOUNTER — Ambulatory Visit (INDEPENDENT_AMBULATORY_CARE_PROVIDER_SITE_OTHER): Payer: Medicare Other | Admitting: Family Medicine

## 2016-10-22 VITALS — BP 128/60 | HR 81 | Temp 98.0°F | Resp 16 | Ht 65.0 in | Wt 146.5 lb

## 2016-10-22 DIAGNOSIS — N186 End stage renal disease: Secondary | ICD-10-CM

## 2016-10-22 DIAGNOSIS — Z0000 Encounter for general adult medical examination without abnormal findings: Secondary | ICD-10-CM | POA: Diagnosis not present

## 2016-10-22 DIAGNOSIS — I200 Unstable angina: Secondary | ICD-10-CM

## 2016-10-22 DIAGNOSIS — I209 Angina pectoris, unspecified: Secondary | ICD-10-CM | POA: Diagnosis not present

## 2016-10-22 DIAGNOSIS — Z125 Encounter for screening for malignant neoplasm of prostate: Secondary | ICD-10-CM | POA: Diagnosis not present

## 2016-10-22 DIAGNOSIS — I25119 Atherosclerotic heart disease of native coronary artery with unspecified angina pectoris: Secondary | ICD-10-CM

## 2016-10-22 DIAGNOSIS — E0840 Diabetes mellitus due to underlying condition with diabetic neuropathy, unspecified: Secondary | ICD-10-CM

## 2016-10-22 DIAGNOSIS — Z992 Dependence on renal dialysis: Secondary | ICD-10-CM

## 2016-10-22 DIAGNOSIS — E1140 Type 2 diabetes mellitus with diabetic neuropathy, unspecified: Secondary | ICD-10-CM | POA: Diagnosis not present

## 2016-10-22 LAB — CBC WITH DIFFERENTIAL/PLATELET
BASOS PCT: 0.7 % (ref 0.0–3.0)
Basophils Absolute: 0.1 10*3/uL (ref 0.0–0.1)
EOS PCT: 2.8 % (ref 0.0–5.0)
Eosinophils Absolute: 0.3 10*3/uL (ref 0.0–0.7)
HEMATOCRIT: 42.2 % (ref 39.0–52.0)
HEMOGLOBIN: 14.1 g/dL (ref 13.0–17.0)
LYMPHS PCT: 9 % — AB (ref 12.0–46.0)
Lymphs Abs: 0.8 10*3/uL (ref 0.7–4.0)
MCHC: 33.4 g/dL (ref 30.0–36.0)
MCV: 99.4 fl (ref 78.0–100.0)
Monocytes Absolute: 0.9 10*3/uL (ref 0.1–1.0)
Monocytes Relative: 9.9 % (ref 3.0–12.0)
Neutro Abs: 7.3 10*3/uL (ref 1.4–7.7)
Neutrophils Relative %: 77.6 % — ABNORMAL HIGH (ref 43.0–77.0)
Platelets: 338 10*3/uL (ref 150.0–400.0)
RBC: 4.24 Mil/uL (ref 4.22–5.81)
RDW: 16.8 % — AB (ref 11.5–15.5)
WBC: 9.4 10*3/uL (ref 4.0–10.5)

## 2016-10-22 LAB — LIPID PANEL
CHOL/HDL RATIO: 5
Cholesterol: 141 mg/dL (ref 0–200)
HDL: 28.3 mg/dL — AB (ref >39.00)
LDL Cholesterol: 84 mg/dL (ref 0–99)
NonHDL: 112.87
Triglycerides: 143 mg/dL (ref 0.0–149.0)
VLDL: 28.6 mg/dL (ref 0.0–40.0)

## 2016-10-22 LAB — BASIC METABOLIC PANEL
BUN: 61 mg/dL — ABNORMAL HIGH (ref 6–23)
CALCIUM: 10 mg/dL (ref 8.4–10.5)
CHLORIDE: 96 meq/L (ref 96–112)
CO2: 27 meq/L (ref 19–32)
CREATININE: 8.05 mg/dL — AB (ref 0.40–1.50)
GFR: 6.97 mL/min — CL (ref >60.00)
Glucose, Bld: 87 mg/dL (ref 70–99)
Potassium: 5.3 mEq/L — ABNORMAL HIGH (ref 3.5–5.1)
SODIUM: 137 meq/L (ref 135–145)

## 2016-10-22 LAB — HEPATIC FUNCTION PANEL
ALT: 7 U/L (ref 0–53)
AST: 9 U/L (ref 0–37)
Albumin: 4.5 g/dL (ref 3.5–5.2)
Alkaline Phosphatase: 115 U/L (ref 39–117)
BILIRUBIN DIRECT: 0.1 mg/dL (ref 0.0–0.3)
BILIRUBIN TOTAL: 0.5 mg/dL (ref 0.2–1.2)
Total Protein: 7 g/dL (ref 6.0–8.3)

## 2016-10-22 LAB — HEMOGLOBIN A1C: HEMOGLOBIN A1C: 5.9 % (ref 4.6–6.5)

## 2016-10-22 LAB — PSA, MEDICARE: PSA: 0.84 ng/ml (ref 0.10–4.00)

## 2016-10-22 LAB — TSH: TSH: 0.5 u[IU]/mL (ref 0.35–4.50)

## 2016-10-22 MED ORDER — CLONAZEPAM 0.5 MG PO TABS: 0.5000 mg | ORAL_TABLET | Freq: Two times a day (BID) | ORAL | 1 refills | Status: DC | PRN

## 2016-10-22 NOTE — Patient Instructions (Addendum)
Follow up in 6 months to recheck diabetes, cholesterol, blood pressure We'll notify you of your lab results and make any changes if needed Keep up the good work!  You look great!!! Call with any questions or concerns Happy Fall!!!  Bring a copy of your living will and/or healthcare power of attorney to your next office visit.  Continue doing brain stimulating activities (puzzles, reading, adult coloring books, staying active) to keep memory sharp.    Health Maintenance, Male A healthy lifestyle and preventive care is important for your health and wellness. Ask your health care provider about what schedule of regular examinations is right for you. What should I know about weight and diet? Eat a Healthy Diet  Eat plenty of vegetables, fruits, whole grains, low-fat dairy products, and lean protein.  Do not eat a lot of foods high in solid fats, added sugars, or salt.  Maintain a Healthy Weight Regular exercise can help you achieve or maintain a healthy weight. You should:  Do at least 150 minutes of exercise each week. The exercise should increase your heart rate and make you sweat (moderate-intensity exercise).  Do strength-training exercises at least twice a week.  Watch Your Levels of Cholesterol and Blood Lipids  Have your blood tested for lipids and cholesterol every 5 years starting at 75 years of age. If you are at high risk for heart disease, you should start having your blood tested when you are 75 years old. You may need to have your cholesterol levels checked more often if: ? Your lipid or cholesterol levels are high. ? You are older than 75 years of age. ? You are at high risk for heart disease.  What should I know about cancer screening? Many types of cancers can be detected early and may often be prevented. Lung Cancer  You should be screened every year for lung cancer if: ? You are a current smoker who has smoked for at least 30 years. ? You are a former smoker who  has quit within the past 15 years.  Talk to your health care provider about your screening options, when you should start screening, and how often you should be screened.  Colorectal Cancer  Routine colorectal cancer screening usually begins at 75 years of age and should be repeated every 5-10 years until you are 75 years old. You may need to be screened more often if early forms of precancerous polyps or small growths are found. Your health care provider may recommend screening at an earlier age if you have risk factors for colon cancer.  Your health care provider may recommend using home test kits to check for hidden blood in the stool.  A small camera at the end of a tube can be used to examine your colon (sigmoidoscopy or colonoscopy). This checks for the earliest forms of colorectal cancer.  Prostate and Testicular Cancer  Depending on your age and overall health, your health care provider may do certain tests to screen for prostate and testicular cancer.  Talk to your health care provider about any symptoms or concerns you have about testicular or prostate cancer.  Skin Cancer  Check your skin from head to toe regularly.  Tell your health care provider about any new moles or changes in moles, especially if: ? There is a change in a mole's size, shape, or color. ? You have a mole that is larger than a pencil eraser.  Always use sunscreen. Apply sunscreen liberally and repeat throughout the day.  Protect yourself by wearing long sleeves, pants, a wide-brimmed hat, and sunglasses when outside.  What should I know about heart disease, diabetes, and high blood pressure?  If you are 53-44 years of age, have your blood pressure checked every 3-5 years. If you are 70 years of age or older, have your blood pressure checked every year. You should have your blood pressure measured twice-once when you are at a hospital or clinic, and once when you are not at a hospital or clinic. Record the  average of the two measurements. To check your blood pressure when you are not at a hospital or clinic, you can use: ? An automated blood pressure machine at a pharmacy. ? A home blood pressure monitor.  Talk to your health care provider about your target blood pressure.  If you are between 96-96 years old, ask your health care provider if you should take aspirin to prevent heart disease.  Have regular diabetes screenings by checking your fasting blood sugar level. ? If you are at a normal weight and have a low risk for diabetes, have this test once every three years after the age of 87. ? If you are overweight and have a high risk for diabetes, consider being tested at a younger age or more often.  A one-time screening for abdominal aortic aneurysm (AAA) by ultrasound is recommended for men aged 70-75 years who are current or former smokers. What should I know about preventing infection? Hepatitis B If you have a higher risk for hepatitis B, you should be screened for this virus. Talk with your health care provider to find out if you are at risk for hepatitis B infection. Hepatitis C Blood testing is recommended for:  Everyone born from [birth year] through 1964.  Anyone with known risk factors for hepatitis C.  Sexually Transmitted Diseases (STDs)  You should be screened each year for STDs including gonorrhea and chlamydia if: ? You are sexually active and are younger than 75 years of age. ? You are older than 75 years of age and your health care provider tells you that you are at risk for this type of infection. ? Your sexual activity has changed since you were last screened and you are at an increased risk for chlamydia or gonorrhea. Ask your health care provider if you are at risk.  Talk with your health care provider about whether you are at high risk of being infected with HIV. Your health care provider may recommend a prescription medicine to help prevent HIV infection.  What else can  I do?  Schedule regular health, dental, and eye exams.  Stay current with your vaccines (immunizations).  Do not use any tobacco products, such as cigarettes, chewing tobacco, and e-cigarettes. If you need help quitting, ask your health care provider.  Limit alcohol intake to no more than 2 drinks per day. One drink equals 12 ounces of beer, 5 ounces of wine, or 1 ounces of hard liquor.  Do not use street drugs.  Do not share needles.  Ask your health care provider for help if you need support or information about quitting drugs.  Tell your health care provider if you often feel depressed.  Tell your health care provider if you have ever been abused or do not feel safe at home. This information is not intended to replace advice given to you by your health care provider. Make sure you discuss any questions you have with your health care provider. Document Released: 06/23/2007 Document  Revised: 08/24/2015 Document Reviewed: 09/28/2014 Elsevier Interactive Patient Education  [employer].

## 2016-10-22 NOTE — Assessment & Plan Note (Signed)
Chronic problem.  On HD Tues/Thurs/Sat

## 2016-10-22 NOTE — Assessment & Plan Note (Signed)
Chronic problem.  He has been off all medications for years and A1C has been well controlled.  UTD on eye exam, foot exam done today.  No need for renal protection due to ESRD.  Check labs.  No anticipated med changes.

## 2016-10-22 NOTE — Progress Notes (Signed)
   Subjective:    Patient ID: [name] [name], male    DOB: 1940/02/21, 76 y.o.   MRN: [id number]  HPI DM- chronic problem, currently diet controlled.  UTD on eye exam, foot exam.  No need for renal protection due to ESRD.  Will have intermittent numbness of L foot.  No sores or ulcers on feet.  Has not seen podiatry in over a year but feet are checked monthly at HD.  HTN- chronic problem, on Amlodipine, Metoprolol w/ good control.  Pt has tendency to drop BP after HD into the low 90s and will have occasional dizziness from this.  Denies CP.  + SOB but this is at baseline for him.  Denies HAs.  No swelling of hands/feet.  CAD- chronic prolem, on Lipitor, ASA, Plavix, and beta blocker.  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  ESRD- chronic problem, on HD Tues/Thurs/Sat.  SOB occurs following HD.   Review of Systems For ROS see HPI     Objective:   Physical Exam  Constitutional: He is oriented to person, place, and time. He appears well-developed and well-nourished. No distress.  HENT:  Head: Normocephalic and atraumatic.  Eyes: Pupils are equal, round, and reactive to light. Conjunctivae and EOM are normal.  Neck: Normal range of motion. Neck supple. No thyromegaly present.  Cardiovascular: Normal rate, regular rhythm, normal heart sounds and intact distal pulses.   No murmur heard. Pulmonary/Chest: Effort normal and breath sounds normal. No respiratory distress.  Abdominal: Soft. Bowel sounds are normal. He exhibits no distension.  Musculoskeletal: He exhibits no edema.  Lymphadenopathy:    He has no cervical adenopathy.  Neurological: He is alert and oriented to person, place, and time. No cranial nerve deficit.  Skin: Skin is warm and dry.  Psychiatric: He has a normal mood and affect. His behavior is normal.  Vitals reviewed.         Assessment & Plan:  See Problem Based Charting  Reviewed Medicare Wellness Visit done by RN and agree  w/ documentation.  Annye Asa, MD

## 2016-10-22 NOTE — Progress Notes (Signed)
Subjective:   [name] [name] is a 76 y.o. male who presents for Medicare Annual/Subsequent preventive examination.  Review of Systems:  No ROS.  Medicare Wellness Visit. Additional risk factors are reflected in the social history.  Cardiac Risk Factors include: advanced age (>34men, >55 women);diabetes mellitus;dyslipidemia;hypertension;male gender;family history of premature cardiovascular disease   Sleep patterns: Sleeps 5-7 hours.  Home Safety/Smoke Alarms: Feels safe in home. Smoke alarms in place.  Living environment; residence and Firearm Safety: Lives with wife in 1 story home.  Seat Belt Safety/Bike Helmet: Wears seat belt.   Male:   CCS-02/22/2016, normal. No recall     PSA-  Lab Results  Component Value Date   PSA 0.97 08/10/2015   PSA 0.62 07/21/2014   PSA 0.56 07/15/2013       Objective:    Vitals: BP 128/60   Pulse 81   Temp 98 F (36.7 C) (Oral)   Resp 16   Ht 5\' 5"  (1.651 m)   Wt 146 lb 8 oz (66.5 kg)   SpO2 98%   BMI 24.38 kg/m   Body mass index is 24.38 kg/m.  Tobacco History  Smoking Status  . Never Smoker  Smokeless Tobacco  . Never Used     Counseling given: Yes   Past Medical History:  Diagnosis Date  . Anemia    Likely secondary to history of GI bleed  . Anginal pain (HCC)    Chronic stable  . Anxiety   . Arthritis   . BPH (benign prostatic hyperplasia)   . CAD (coronary artery disease) of bypass graft 2006, 2012   In 2006: Occluded SVG-OM noted (SVG-diagonal was previously occluded); 2012: Severe lesion in redo SVG-OM1 --> BMS PCI   . CAD in native artery; and the grafts 1992, 1997, 2002, 2006, 2012   CABG 1992 and redo CABG 2006  . CAD S/P percutaneous coronary angioplasty October 2012; Feb, Apr & Oct 2015; Feb 2016   a) s/p CABG 1992 and redo 2006. b) 10/12: NSTEMI: PCI to SVG-OM1 Integrity BMS 3.5 x 15 (3.85 mm) c) 2/15: UA - PCI mid RCA Xience Ap DES 2.75 x 15 (3.1 mm). d) 4/15 : UA - focal dRCA Resolute DES 2.25 x 8 (2.5  mm). e) 10/15: PCI to mRCA ISR w/ post stent lesion - Promus P 2.75 x 16 (3.25 mm). f) 2/16: NSTEMI: CBA/PTCA of  mRCA ISR (3.5 mm post-dilation); g) 7/16 ISR RCA->CBA/PTCA, residual 20%.  . Carotid arterial disease ([place])    a) Duplex 05/2012: R BULB/PROX ICA: 50-69%, L BULB/PROX ICA: 0-49%. ;; 04/29/2014: 47-42% RICA, 59-56% LICA. Patetn vertebrals,  . Chronic low back pain   . Colon cancer [facility]) 2003   colectomy for CA. no recurrence . never required  radiation or chem  . Diabetes mellitus without complication ([place])   . Dyspnea    "when I get too much Fluid"  . End stage renal disease on dialysis [facility])    Dialyzes at [facility]: Tues/Th/Sat - left upper cavity AV fistula brachiocephalic  . Gout   . Heart murmur   . History of hiatal hernia   . History of Non-ST elevated myocardial infarction (non-STEMI) 1992, 2006, 2012; 02/2013  . Hyperlipidemia   . Hypertension   . Hypothyroidism    On supplementation  . Ischemic cardiomyopathy    a) EF 45-50% by echo 2015. b) EF 50% by cath 04/2014.  Marland Kitchen LBBB (left bundle branch block)    Chronic  . Peptic ulcer  disease  2004  . Plavix resistance    a) P2Y12 264 in 02/2014 - put on Brilinta but did not tolerate due to SOB. b) cannot be on Effient due to history of stroke. c) Plavix increased to 75mg  BID and Pletal added 04/2014 due to ISR.  Marland Kitchen Pneumonia   . Skin cancer 02/1999   nose  . Stroke [facility]) 1998   Past Surgical History:  Procedure Laterality Date  . AORTIC ARCH ANGIOGRAPHY N/A 05/09/2016   Procedure: Aortic Arch Angiography;  Surgeon: Waynetta Sandy, MD;  Location: [place] CV LAB;  Service: Cardiovascular;  Laterality: N/A;  . APPENDECTOMY    . AV FISTULA REPAIR Left 05/2009  . CARDIAC CATHETERIZATION  10/16/2010   patent  LIMA to the LAD , PATENT  svg TO 2nd marginals ,occluded PLA with collaterals and SVG to the OM  had 90% stenosis  was txlge  bare - metal  3.5  Integrity ten  postdilate  36-37  mm    . CARDIAC  CATHETERIZATION  03/22/2004   loss of both SVG,severe disease in prox and ostial  circ not amenable to invention. mod diease distal LAD  AFTER BYPASS GRAFT;-CVTS to evaluate   . CARDIAC CATHETERIZATION N/A 08/03/2014   Procedure: Left Heart Cath and Cors/Grafts Angiography;  Surgeon: Leonie Man, MD;  Location: [place] CV LAB;  Service: Cardiovascular;  Laterality: N/A;  . CARDIAC CATHETERIZATION N/A 08/03/2014   Procedure: Coronary Balloon Angioplasty;  Surgeon: Leonie Man, MD;  Location: [place] CV LAB;  Service: Cardiovascular;: Aggressive Cutting Balloon - [place] Balloon PTCA of ISR site in mRCA (3 stent layer)  . CAROTID DOPPLER  05/23/2012   ABN CAROTID-- RGT BULB/PROX ICA mild to mod 50-60%;lft bulb/prox mild to mod 0-49%;left subclavian abn waveforms consistent with patients lft arm A/V fistula  . CATARACT EXTRACTION W/ INTRAOCULAR LENS  IMPLANT, BILATERAL Bilateral   . CHOLECYSTECTOMY  2009  . COLON RESECTION  2003   transverse and proximal descending w/ primar anastomosis  . COLON SURGERY  2003   "cancer"  . COLONOSCOPY    . CORONARY ANGIOPLASTY  04/03/1995   OM and Circ  . CORONARY ANGIOPLASTY  02/01/2000   CIRC  . CORONARY ANGIOPLASTY  02/09/14   Cutting Balloon PTCA of mRCA ISR 99% - 3.5 mm  . CORONARY ARTERY BYPASS GRAFT  08/22/1990   INITIAL:LIMA to LAD, SVG to  Diagonal, SVG to OM  . CORONARY ARTERY BYPASS GRAFT  2006   SVG to OM1,SVG to OM2 with patent LIMA  to LAD and occluded vein  graft to the diagonal  and occluded vein grft to OM from  prior  surgery  . CORONARY ATHERECTOMY  05/21/2016   Successful PTCA and cutting balloon atherectomy of mid dominant RCA "in-stent restenosis of the lesion that had been intervened on close to 2 years ago with an excellent result.   . CORONARY BALLOON ANGIOPLASTY N/A 05/21/2016   Procedure: Coronary Balloon Angioplasty;  Surgeon: Lorretta Harp, MD;  Location: [place] CV LAB;  Service: Cardiovascular;  Laterality: N/A;    . DISTAL REVASCULARIZATION AND INTERVAL LIGATION (DRIL) Left 05/14/2016   Procedure: DISTAL REVASCULARIZATION AND INTERVAL LIGATION (DRIL) LEFT ARM;  Surgeon: Waynetta Sandy, MD;  Location: [place];  Service: Vascular;  Laterality: Left;  . DOPPLER ECHOCARDIOGRAPHY  01/25/2012   EF 50 to 55%  . EVENT MONITOR  01/23/2012-02/06/2012   SINUS ,LBBB,unifocal PVCs  . LEFT AND RIGHT HEART CATHETERIZATION WITH CORONARY/GRAFT ANGIOGRAM  N/A 04/20/2014   Procedure: LEFT AND RIGHT HEART CATHETERIZATION WITH Beatrix Fetters;  Surgeon: Sherren Mocha, MD;  Location: [facility] CATH LAB;  Service: Cardiovascular;  Laterality: N/A;  . LEFT HEART CATH AND CORS/GRAFTS ANGIOGRAPHY N/A 05/21/2016   Procedure: LEFT HEART CATH AND CORS/GRAFTS ANGIOGRAPHY;  Surgeon: Lorretta Harp, MD;  Location: [place] CV LAB;  Service: Cardiovascular;  Laterality: N/A;  . LEFT HEART CATHETERIZATION WITH CORONARY ANGIOGRAM N/A 02/23/2013   Procedure: LEFT HEART CATHETERIZATION WITH CORONARY ANGIOGRAM;  Surgeon: Lorretta Harp, MD;  Location: [facility] CATH LAB;  Service: Cardiovascular;  Laterality: N/A;  . LEFT HEART CATHETERIZATION WITH CORONARY/GRAFT ANGIOGRAM N/A 04/30/2013   Procedure: LEFT HEART CATHETERIZATION WITH Beatrix Fetters;  Surgeon: Leonie Man, MD;  Location: [facility] CATH LAB;  Service: Cardiovascular;  Laterality: N/A;  . LEFT HEART CATHETERIZATION WITH CORONARY/GRAFT ANGIOGRAM N/A 10/15/2013   Procedure: LEFT HEART CATHETERIZATION WITH Beatrix Fetters;  Surgeon: Leonie Man, MD;  Location: [facility] CATH LAB;  Service: Cardiovascular;  Laterality: N/A;  . LEFT HEART CATHETERIZATION WITH CORONARY/GRAFT ANGIOGRAM N/A 02/09/2014   Procedure: LEFT HEART CATHETERIZATION WITH Beatrix Fetters;  Surgeon: Leonie Man, MD;  Location: [facility] CATH LAB;  Service: Cardiovascular;  Laterality: N/A;  . Lower Extremity Arterial Dopplers  October 2014   Calcified but non-occlusive peripheral arteries. No  evidence of stenosis  . NM MYOCAR PERF WALL MOTION  10/12/2011   EF 54%,LV normal ; no signifiant ischemia  . PERCUTANEOUS CORONARY STENT INTERVENTION (PCI-S)  02/23/2013   mid RCA 80% & 70% - Xience 2.75 mm x 15 mm ( 3.15mm) ; LIMA-LAD patent, SVG-OM patent (stent patent); SVG-Diag patent.  Marland Kitchen PERCUTANEOUS CORONARY STENT INTERVENTION (PCI-S)  April 2015   dRCA - Integrity Resolute DES   2.25 mm x 71mm (2.5 mm)  . PERCUTANEOUS CORONARY STENT INTERVENTION (PCI-S)  Oct 15 2013   [place] Angina: mRCA ISR with post-stent stenosis -- Cuting PTCA & PCI Promus Premier DES 2.75 mm x 16 mm (3.25 mm)  . POSTERIOR LUMBAR FUSION    . REVISON OF ARTERIOVENOUS FISTULA Left 45/80/9983   Procedure: PLICATION OF LEFT UPPER ARM  ARTERIOVENOUS FISTULA  ANEURYSM;  Surgeon: Rosetta Posner, MD;  Location: [place];  Service: Vascular;  Laterality: Left;  . TRANSTHORACIC ECHOCARDIOGRAM  02/23/2013   Moderate concentric hypertrophy. EF 4500%. Septal bounce. Grade 2 diastolic dysfunction (pseudo-normal - severely elevated filling pressures) mild to moderate MR and moderate LA dilation to  . UPPER EXTREMITY ANGIOGRAPHY Left 05/09/2016   Procedure: Upper Extremity Angiography;  Surgeon: Waynetta Sandy, MD;  Location: [place] CV LAB;  Service: Cardiovascular;  Laterality: Left;   Family History  Problem Relation Age of Onset  . Heart disease Mother   . Hypertension Mother   . Diabetes Mother   . Heart attack Mother   . Heart disease Father   . Hypertension Father   . Diabetes Father   . Heart attack Father   . Stroke Paternal Aunt   . Stroke Paternal Uncle   . Colon cancer Neg Hx   . Esophageal cancer Neg Hx    History  Sexual Activity  . Sexual activity: No    Outpatient Encounter Prescriptions as of 10/22/2016  Medication Sig  . acetaminophen (TYLENOL) 500 MG tablet Take 1,000 mg by mouth 2 (two) times daily as needed for mild pain.   Marland Kitchen amLODipine (NORVASC) 5 MG tablet Take 0.5 tablets (2.5 mg  total) by mouth daily. on non dialysis day  .  aspirin 81 MG chewable tablet Chew 1 tablet (81 mg total) by mouth daily.  Marland Kitchen atorvastatin (LIPITOR) 20 MG tablet Take by mouth 2 (two) times daily. Take 0.5 mg twice daily  . Besifloxacin HCl (BESIVANCE) 0.6 % SUSP Place 1 drop into both eyes See admin instructions. Use eye drops 2 times daily for 2 days following injection by Dr. Zigmund Daniel (every 10 weeks)  . cilostazol (PLETAL) 100 MG tablet TAKE ONE TABLET BY MOUTH TWICE DAILY  . clonazePAM (KLONOPIN) 0.5 MG tablet Take 1 tablet (0.5 mg total) by mouth 2 (two) times daily as needed. for anxiety  . clopidogrel (PLAVIX) 75 MG tablet TAKE ONE TABLET BY MOUTH ONCE DAILY  . ferric citrate (AURYXIA) 1 GM 210 MG(Fe) tablet Take 420 mg by mouth 3 (three) times daily with meals.  Marland Kitchen glucose blood (TRUE METRIX BLOOD GLUCOSE TEST) test strip 1 each by Other route as needed for other. Use as instructed to test sugars 2-3 times daily. Dx. E11.40  . isosorbide mononitrate (IMDUR) 120 MG 24 hr tablet TAKE 1 TABLET BY MOUTH ONCE DAILY  . levothyroxine (SYNTHROID, LEVOTHROID) 125 MCG tablet TAKE ONE TABLET BY MOUTH BEFORE BREAKFAST  . lidocaine-prilocaine (EMLA) cream Apply 1 application topically See admin instructions. Apply prior to dialysis treatments on Tuesday, Thursday and Saturday  . MELATONIN PO Take 1 tablet by mouth at bedtime as needed (sleep).  . metoprolol succinate (TOPROL-XL) 25 MG 24 hr tablet TAKE 1 TABLET BY MOUTH AT BEDTIME WITH OR IMMEDIATELY FOLLOWING A MEAL  . metoprolol tartrate (LOPRESSOR) 25 MG tablet Take 25 mg by mouth daily.  Marland Kitchen NITROSTAT 0.4 MG SL tablet PLACE 1 TABLET UNDER THE TONGUE EVERY 5 MINUTES AS NEEDED FOR CHEST PAIN (MAX 3 DOSES PER DAY)  . pantoprazole (PROTONIX) 40 MG tablet Take 1 tablet (40 mg total) by mouth daily.  . [DISCONTINUED] clonazePAM (KLONOPIN) 0.5 MG tablet Take 1 tablet (0.5 mg total) by mouth 2 (two) times daily as needed. for anxiety   No facility-administered  encounter medications on file as of 10/22/2016.     Activities of Daily Living In your present state of health, do you have any difficulty performing the following activities: 10/22/2016 10/22/2016  Hearing? N N  Vision? N Y  Comment - pt receives shots in the eye  Difficulty concentrating or making decisions? N N  Walking or climbing stairs? N N  Dressing or bathing? N N  Doing errands, shopping? N N  Preparing Food and eating ? N -  Using the Toilet? N -  In the past six months, have you accidently leaked urine? N -  Do you have problems with loss of bowel control? N -  Managing your Medications? N -  Managing your Finances? N -  Housekeeping or managing your Housekeeping? N -  Some recent data might be hidden    Patient Care Team: Midge Minium, MD as PCP - Dellia Nims, MD (Nephrology) Leonie Man, MD as Consulting Physician (Cardiology) Rosemary Holms, DPM as Consulting Physician (Podiatry) Center, [facility] Festus Aloe, MD as Consulting Physician (Urology) Jari Pigg, MD as Consulting Physician (Dermatology) Gatha Mayer, MD as Consulting Physician (Gastroenterology)   Assessment:    Physical assessment deferred to PCP.  Exercise Activities and Dietary recommendations Current Exercise Habits: The patient does not participate in regular exercise at present, Exercise limited by: cardiac condition(s)  Diet (meal preparation, eat out, water intake, caffeinated beverages, dairy products, fruits and vegetables): Drinks water and  diet mt dew. Restricted to 32 oz/day.   Breakfast: eggs, bacon/sausage, cereal Lunch: skips most days; sandwich Dinner: protein and vegetables.     Goals      Patient Stated   . patient states (pt-stated)          Maintain current health.       Fall Risk Fall Risk  10/22/2016 10/22/2016 08/10/2015 07/21/2014 07/15/2013  Falls in the past year? Yes Yes Yes No No  Number falls in past yr: 2 or  more 2 or more 2 or more - -  Injury with Fall? No No No - -  Risk for fall due to : Impaired balance/gait - - - -  Follow up Falls prevention discussed - Falls prevention discussed - -   Depression Screen PHQ 2/9 Scores 10/22/2016 08/10/2015 07/21/2014 07/15/2013  PHQ - 2 Score 1 0 0 0  PHQ- 9 Score 3 - - -    Cognitive Function       Ad8 score reviewed for issues:  Issues making decisions: no  Less interest in hobbies / activities: no  Repeats questions, stories (family complaining): no  Trouble using ordinary gadgets (microwave, computer, phone): no  Forgets the month or year: no  Mismanaging finances: no  Remembering appts: no  Daily problems with thinking and/or memory: no Ad8 score is=0     Immunization History  Administered Date(s) Administered  . Hep A / Hep B 04/08/2012, 05/08/2012, 10/08/2012  . Influenza, High Dose Seasonal PF 09/22/2016  . Influenza,inj,Quad PF,6+ Mos 11/22/2012, 11/18/2013, 10/09/2014, 10/11/2015  . PPD Test 10/24/2013  . Pneumococcal Conjugate-13 07/21/2014  . Pneumococcal Polysaccharide-23 05/03/2010  . Tdap 03/23/2008   Declines Shingrix  Screening Tests Health Maintenance  Topic Date Due  . HEMOGLOBIN A1C  06/20/2016  . FOOT EXAM  12/20/2016  . OPHTHALMOLOGY EXAM  10/19/2017  . TETANUS/TDAP  03/24/2018  . Fecal DNA (Cologuard)  08/16/2018  . INFLUENZA VACCINE  Completed  . PNA vac Low Risk Adult  Completed      Plan:    Bring a copy of your living will and/or healthcare power of attorney to your next office visit.  Continue doing brain stimulating activities (puzzles, reading, adult coloring books, staying active) to keep memory sharp.   I have personally reviewed and noted the following in the patient's chart:   . Medical and social history . Use of alcohol, tobacco or illicit drugs  . Current medications and supplements . Functional ability and status . Nutritional status . Physical activity . Advanced  directives . List of other physicians . Hospitalizations, surgeries, and ER visits in previous 12 months . Vitals . Screenings to include cognitive, depression, and falls . Referrals and appointments  In addition, I have reviewed and discussed with patient certain preventive protocols, quality metrics, and best practice recommendations. A written personalized care plan for preventive services as well as general preventive health recommendations were provided to patient.     Gerilyn Nestle, RN  10/22/2016

## 2016-10-22 NOTE — Assessment & Plan Note (Signed)
Chronic problem.  Pt's most recent MI in May has temporarily stopped his chronic angina.  On beta blocker, ASA, plavix, and statin.  Check labs.  No anticipated med changes.  Will follow.

## 2016-10-23 DIAGNOSIS — E876 Hypokalemia: Secondary | ICD-10-CM | POA: Diagnosis not present

## 2016-10-23 DIAGNOSIS — E1129 Type 2 diabetes mellitus with other diabetic kidney complication: Secondary | ICD-10-CM | POA: Diagnosis not present

## 2016-10-23 DIAGNOSIS — N2581 Secondary hyperparathyroidism of renal origin: Secondary | ICD-10-CM | POA: Diagnosis not present

## 2016-10-23 DIAGNOSIS — N186 End stage renal disease: Secondary | ICD-10-CM | POA: Diagnosis not present

## 2016-10-23 DIAGNOSIS — D631 Anemia in chronic kidney disease: Secondary | ICD-10-CM | POA: Diagnosis not present

## 2016-10-25 DIAGNOSIS — E1129 Type 2 diabetes mellitus with other diabetic kidney complication: Secondary | ICD-10-CM | POA: Diagnosis not present

## 2016-10-25 DIAGNOSIS — N186 End stage renal disease: Secondary | ICD-10-CM | POA: Diagnosis not present

## 2016-10-25 DIAGNOSIS — E876 Hypokalemia: Secondary | ICD-10-CM | POA: Diagnosis not present

## 2016-10-25 DIAGNOSIS — D631 Anemia in chronic kidney disease: Secondary | ICD-10-CM | POA: Diagnosis not present

## 2016-10-25 DIAGNOSIS — N2581 Secondary hyperparathyroidism of renal origin: Secondary | ICD-10-CM | POA: Diagnosis not present

## 2016-10-26 DIAGNOSIS — I871 Compression of vein: Secondary | ICD-10-CM | POA: Diagnosis not present

## 2016-10-26 DIAGNOSIS — N186 End stage renal disease: Secondary | ICD-10-CM | POA: Diagnosis not present

## 2016-10-26 DIAGNOSIS — T82858A Stenosis of vascular prosthetic devices, implants and grafts, initial encounter: Secondary | ICD-10-CM | POA: Diagnosis not present

## 2016-10-26 DIAGNOSIS — Z992 Dependence on renal dialysis: Secondary | ICD-10-CM | POA: Diagnosis not present

## 2016-10-27 DIAGNOSIS — N186 End stage renal disease: Secondary | ICD-10-CM | POA: Diagnosis not present

## 2016-10-27 DIAGNOSIS — D631 Anemia in chronic kidney disease: Secondary | ICD-10-CM | POA: Diagnosis not present

## 2016-10-27 DIAGNOSIS — N2581 Secondary hyperparathyroidism of renal origin: Secondary | ICD-10-CM | POA: Diagnosis not present

## 2016-10-27 DIAGNOSIS — E1129 Type 2 diabetes mellitus with other diabetic kidney complication: Secondary | ICD-10-CM | POA: Diagnosis not present

## 2016-10-27 DIAGNOSIS — E876 Hypokalemia: Secondary | ICD-10-CM | POA: Diagnosis not present

## 2016-10-30 DIAGNOSIS — N186 End stage renal disease: Secondary | ICD-10-CM | POA: Diagnosis not present

## 2016-10-30 DIAGNOSIS — E1129 Type 2 diabetes mellitus with other diabetic kidney complication: Secondary | ICD-10-CM | POA: Diagnosis not present

## 2016-10-30 DIAGNOSIS — N2581 Secondary hyperparathyroidism of renal origin: Secondary | ICD-10-CM | POA: Diagnosis not present

## 2016-10-30 DIAGNOSIS — E876 Hypokalemia: Secondary | ICD-10-CM | POA: Diagnosis not present

## 2016-10-30 DIAGNOSIS — D631 Anemia in chronic kidney disease: Secondary | ICD-10-CM | POA: Diagnosis not present

## 2016-11-01 DIAGNOSIS — E876 Hypokalemia: Secondary | ICD-10-CM | POA: Diagnosis not present

## 2016-11-01 DIAGNOSIS — N186 End stage renal disease: Secondary | ICD-10-CM | POA: Diagnosis not present

## 2016-11-01 DIAGNOSIS — E1129 Type 2 diabetes mellitus with other diabetic kidney complication: Secondary | ICD-10-CM | POA: Diagnosis not present

## 2016-11-01 DIAGNOSIS — N2581 Secondary hyperparathyroidism of renal origin: Secondary | ICD-10-CM | POA: Diagnosis not present

## 2016-11-01 DIAGNOSIS — D631 Anemia in chronic kidney disease: Secondary | ICD-10-CM | POA: Diagnosis not present

## 2016-11-03 DIAGNOSIS — N2581 Secondary hyperparathyroidism of renal origin: Secondary | ICD-10-CM | POA: Diagnosis not present

## 2016-11-03 DIAGNOSIS — E876 Hypokalemia: Secondary | ICD-10-CM | POA: Diagnosis not present

## 2016-11-03 DIAGNOSIS — N186 End stage renal disease: Secondary | ICD-10-CM | POA: Diagnosis not present

## 2016-11-03 DIAGNOSIS — E1129 Type 2 diabetes mellitus with other diabetic kidney complication: Secondary | ICD-10-CM | POA: Diagnosis not present

## 2016-11-03 DIAGNOSIS — D631 Anemia in chronic kidney disease: Secondary | ICD-10-CM | POA: Diagnosis not present

## 2016-11-06 DIAGNOSIS — N2581 Secondary hyperparathyroidism of renal origin: Secondary | ICD-10-CM | POA: Diagnosis not present

## 2016-11-06 DIAGNOSIS — E1129 Type 2 diabetes mellitus with other diabetic kidney complication: Secondary | ICD-10-CM | POA: Diagnosis not present

## 2016-11-06 DIAGNOSIS — N186 End stage renal disease: Secondary | ICD-10-CM | POA: Diagnosis not present

## 2016-11-06 DIAGNOSIS — D631 Anemia in chronic kidney disease: Secondary | ICD-10-CM | POA: Diagnosis not present

## 2016-11-06 DIAGNOSIS — E876 Hypokalemia: Secondary | ICD-10-CM | POA: Diagnosis not present

## 2016-11-07 DIAGNOSIS — Z992 Dependence on renal dialysis: Secondary | ICD-10-CM | POA: Diagnosis not present

## 2016-11-07 DIAGNOSIS — E1129 Type 2 diabetes mellitus with other diabetic kidney complication: Secondary | ICD-10-CM | POA: Diagnosis not present

## 2016-11-07 DIAGNOSIS — N186 End stage renal disease: Secondary | ICD-10-CM | POA: Diagnosis not present

## 2016-11-08 DIAGNOSIS — N186 End stage renal disease: Secondary | ICD-10-CM | POA: Diagnosis not present

## 2016-11-08 DIAGNOSIS — E1129 Type 2 diabetes mellitus with other diabetic kidney complication: Secondary | ICD-10-CM | POA: Diagnosis not present

## 2016-11-08 DIAGNOSIS — N2581 Secondary hyperparathyroidism of renal origin: Secondary | ICD-10-CM | POA: Diagnosis not present

## 2016-11-08 DIAGNOSIS — E876 Hypokalemia: Secondary | ICD-10-CM | POA: Diagnosis not present

## 2016-11-10 DIAGNOSIS — E1129 Type 2 diabetes mellitus with other diabetic kidney complication: Secondary | ICD-10-CM | POA: Diagnosis not present

## 2016-11-10 DIAGNOSIS — N186 End stage renal disease: Secondary | ICD-10-CM | POA: Diagnosis not present

## 2016-11-10 DIAGNOSIS — E876 Hypokalemia: Secondary | ICD-10-CM | POA: Diagnosis not present

## 2016-11-10 DIAGNOSIS — N2581 Secondary hyperparathyroidism of renal origin: Secondary | ICD-10-CM | POA: Diagnosis not present

## 2016-11-13 ENCOUNTER — Other Ambulatory Visit: Payer: Self-pay | Admitting: Internal Medicine

## 2016-11-13 DIAGNOSIS — N186 End stage renal disease: Secondary | ICD-10-CM | POA: Diagnosis not present

## 2016-11-13 DIAGNOSIS — E1129 Type 2 diabetes mellitus with other diabetic kidney complication: Secondary | ICD-10-CM | POA: Diagnosis not present

## 2016-11-13 DIAGNOSIS — N2581 Secondary hyperparathyroidism of renal origin: Secondary | ICD-10-CM | POA: Diagnosis not present

## 2016-11-13 DIAGNOSIS — E876 Hypokalemia: Secondary | ICD-10-CM | POA: Diagnosis not present

## 2016-11-13 NOTE — Telephone Encounter (Signed)
Refill x 2 

## 2016-11-13 NOTE — Telephone Encounter (Signed)
Informed [name] [name] that generic flagyl refilled.

## 2016-11-13 NOTE — Telephone Encounter (Signed)
Patient requesting a refill on the flagyl for diarrhea.  Onset x 2 days, no fever, no nausea or vomiting, no blood in the diarrhea that he could tell. Says his stools are dark always because of his dialysis.

## 2016-11-15 ENCOUNTER — Telehealth: Payer: Self-pay | Admitting: Cardiology

## 2016-11-15 DIAGNOSIS — N186 End stage renal disease: Secondary | ICD-10-CM | POA: Diagnosis not present

## 2016-11-15 DIAGNOSIS — N2581 Secondary hyperparathyroidism of renal origin: Secondary | ICD-10-CM | POA: Diagnosis not present

## 2016-11-15 DIAGNOSIS — E1129 Type 2 diabetes mellitus with other diabetic kidney complication: Secondary | ICD-10-CM | POA: Diagnosis not present

## 2016-11-15 DIAGNOSIS — E876 Hypokalemia: Secondary | ICD-10-CM | POA: Diagnosis not present

## 2016-11-15 NOTE — Telephone Encounter (Signed)
Spoke with pt, for the last 2 weeks he has had problems with his bp dropping during and after dialysis. The top number is 70-80 during dialysis and today it was 96 prior to his leaving. He reports dizziness and SOB when standing from sitting. He wants to know if there are any medications he can stop. He is currently taking amlodipine 2.5 mg on non-dialysis days, metoprolol succ 25 mg daily and isosorbide 120 mg daily. Will forward for dr harding's review and advise.

## 2016-11-15 NOTE — Telephone Encounter (Signed)
Lets change the pre HD day dose of Toprol to 1/2 tab.    Not much more we can do -- next step = can split the Imdur to 60 mg BID & hold the dose pre-HD.  [name], MD

## 2016-11-15 NOTE — Telephone Encounter (Signed)
Pt c/o BP issue: STAT if pt c/o blurred vision, one-sided weakness or slurred speech  1. What are your last 5 BP readings? "70's and 80's top number"  2. Are you having any other symptoms (ex. Dizziness, headache, blurred vision, passed out)?  No   3. What is your BP issue? Low BP

## 2016-11-15 NOTE — Telephone Encounter (Signed)
Spoke with pt, aware and repeated the medication recommendations from dr harding. He will call back if still having problems to discuss changing the isosorbde

## 2016-11-17 DIAGNOSIS — N2581 Secondary hyperparathyroidism of renal origin: Secondary | ICD-10-CM | POA: Diagnosis not present

## 2016-11-17 DIAGNOSIS — E876 Hypokalemia: Secondary | ICD-10-CM | POA: Diagnosis not present

## 2016-11-17 DIAGNOSIS — E1129 Type 2 diabetes mellitus with other diabetic kidney complication: Secondary | ICD-10-CM | POA: Diagnosis not present

## 2016-11-17 DIAGNOSIS — N186 End stage renal disease: Secondary | ICD-10-CM | POA: Diagnosis not present

## 2016-11-20 DIAGNOSIS — N186 End stage renal disease: Secondary | ICD-10-CM | POA: Diagnosis not present

## 2016-11-20 DIAGNOSIS — E1129 Type 2 diabetes mellitus with other diabetic kidney complication: Secondary | ICD-10-CM | POA: Diagnosis not present

## 2016-11-20 DIAGNOSIS — E876 Hypokalemia: Secondary | ICD-10-CM | POA: Diagnosis not present

## 2016-11-20 DIAGNOSIS — N2581 Secondary hyperparathyroidism of renal origin: Secondary | ICD-10-CM | POA: Diagnosis not present

## 2016-11-22 DIAGNOSIS — E1129 Type 2 diabetes mellitus with other diabetic kidney complication: Secondary | ICD-10-CM | POA: Diagnosis not present

## 2016-11-22 DIAGNOSIS — N2581 Secondary hyperparathyroidism of renal origin: Secondary | ICD-10-CM | POA: Diagnosis not present

## 2016-11-22 DIAGNOSIS — N186 End stage renal disease: Secondary | ICD-10-CM | POA: Diagnosis not present

## 2016-11-22 DIAGNOSIS — E876 Hypokalemia: Secondary | ICD-10-CM | POA: Diagnosis not present

## 2016-11-24 DIAGNOSIS — E876 Hypokalemia: Secondary | ICD-10-CM | POA: Diagnosis not present

## 2016-11-24 DIAGNOSIS — N186 End stage renal disease: Secondary | ICD-10-CM | POA: Diagnosis not present

## 2016-11-24 DIAGNOSIS — N2581 Secondary hyperparathyroidism of renal origin: Secondary | ICD-10-CM | POA: Diagnosis not present

## 2016-11-24 DIAGNOSIS — E1129 Type 2 diabetes mellitus with other diabetic kidney complication: Secondary | ICD-10-CM | POA: Diagnosis not present

## 2016-11-26 ENCOUNTER — Other Ambulatory Visit: Payer: Self-pay | Admitting: Family Medicine

## 2016-11-26 DIAGNOSIS — E876 Hypokalemia: Secondary | ICD-10-CM | POA: Diagnosis not present

## 2016-11-26 DIAGNOSIS — N186 End stage renal disease: Secondary | ICD-10-CM | POA: Diagnosis not present

## 2016-11-26 DIAGNOSIS — E1129 Type 2 diabetes mellitus with other diabetic kidney complication: Secondary | ICD-10-CM | POA: Diagnosis not present

## 2016-11-26 DIAGNOSIS — N2581 Secondary hyperparathyroidism of renal origin: Secondary | ICD-10-CM | POA: Diagnosis not present

## 2016-11-28 DIAGNOSIS — E876 Hypokalemia: Secondary | ICD-10-CM | POA: Diagnosis not present

## 2016-11-28 DIAGNOSIS — E1129 Type 2 diabetes mellitus with other diabetic kidney complication: Secondary | ICD-10-CM | POA: Diagnosis not present

## 2016-11-28 DIAGNOSIS — N2581 Secondary hyperparathyroidism of renal origin: Secondary | ICD-10-CM | POA: Diagnosis not present

## 2016-11-28 DIAGNOSIS — N186 End stage renal disease: Secondary | ICD-10-CM | POA: Diagnosis not present

## 2016-12-01 DIAGNOSIS — N2581 Secondary hyperparathyroidism of renal origin: Secondary | ICD-10-CM | POA: Diagnosis not present

## 2016-12-01 DIAGNOSIS — E1129 Type 2 diabetes mellitus with other diabetic kidney complication: Secondary | ICD-10-CM | POA: Diagnosis not present

## 2016-12-01 DIAGNOSIS — E876 Hypokalemia: Secondary | ICD-10-CM | POA: Diagnosis not present

## 2016-12-01 DIAGNOSIS — N186 End stage renal disease: Secondary | ICD-10-CM | POA: Diagnosis not present

## 2016-12-04 DIAGNOSIS — N186 End stage renal disease: Secondary | ICD-10-CM | POA: Diagnosis not present

## 2016-12-04 DIAGNOSIS — N2581 Secondary hyperparathyroidism of renal origin: Secondary | ICD-10-CM | POA: Diagnosis not present

## 2016-12-04 DIAGNOSIS — E1129 Type 2 diabetes mellitus with other diabetic kidney complication: Secondary | ICD-10-CM | POA: Diagnosis not present

## 2016-12-04 DIAGNOSIS — E876 Hypokalemia: Secondary | ICD-10-CM | POA: Diagnosis not present

## 2016-12-06 DIAGNOSIS — N186 End stage renal disease: Secondary | ICD-10-CM | POA: Diagnosis not present

## 2016-12-06 DIAGNOSIS — E876 Hypokalemia: Secondary | ICD-10-CM | POA: Diagnosis not present

## 2016-12-06 DIAGNOSIS — N2581 Secondary hyperparathyroidism of renal origin: Secondary | ICD-10-CM | POA: Diagnosis not present

## 2016-12-06 DIAGNOSIS — E1129 Type 2 diabetes mellitus with other diabetic kidney complication: Secondary | ICD-10-CM | POA: Diagnosis not present

## 2016-12-07 DIAGNOSIS — E1129 Type 2 diabetes mellitus with other diabetic kidney complication: Secondary | ICD-10-CM | POA: Diagnosis not present

## 2016-12-07 DIAGNOSIS — N186 End stage renal disease: Secondary | ICD-10-CM | POA: Diagnosis not present

## 2016-12-07 DIAGNOSIS — Z992 Dependence on renal dialysis: Secondary | ICD-10-CM | POA: Diagnosis not present

## 2016-12-08 DIAGNOSIS — N2581 Secondary hyperparathyroidism of renal origin: Secondary | ICD-10-CM | POA: Diagnosis not present

## 2016-12-08 DIAGNOSIS — N186 End stage renal disease: Secondary | ICD-10-CM | POA: Diagnosis not present

## 2016-12-08 DIAGNOSIS — D631 Anemia in chronic kidney disease: Secondary | ICD-10-CM | POA: Diagnosis not present

## 2016-12-08 DIAGNOSIS — E876 Hypokalemia: Secondary | ICD-10-CM | POA: Diagnosis not present

## 2016-12-08 DIAGNOSIS — E1129 Type 2 diabetes mellitus with other diabetic kidney complication: Secondary | ICD-10-CM | POA: Diagnosis not present

## 2016-12-11 DIAGNOSIS — D631 Anemia in chronic kidney disease: Secondary | ICD-10-CM | POA: Diagnosis not present

## 2016-12-11 DIAGNOSIS — E876 Hypokalemia: Secondary | ICD-10-CM | POA: Diagnosis not present

## 2016-12-11 DIAGNOSIS — N186 End stage renal disease: Secondary | ICD-10-CM | POA: Diagnosis not present

## 2016-12-11 DIAGNOSIS — E1129 Type 2 diabetes mellitus with other diabetic kidney complication: Secondary | ICD-10-CM | POA: Diagnosis not present

## 2016-12-11 DIAGNOSIS — N2581 Secondary hyperparathyroidism of renal origin: Secondary | ICD-10-CM | POA: Diagnosis not present

## 2016-12-13 DIAGNOSIS — E876 Hypokalemia: Secondary | ICD-10-CM | POA: Diagnosis not present

## 2016-12-13 DIAGNOSIS — N2581 Secondary hyperparathyroidism of renal origin: Secondary | ICD-10-CM | POA: Diagnosis not present

## 2016-12-13 DIAGNOSIS — D631 Anemia in chronic kidney disease: Secondary | ICD-10-CM | POA: Diagnosis not present

## 2016-12-13 DIAGNOSIS — E1129 Type 2 diabetes mellitus with other diabetic kidney complication: Secondary | ICD-10-CM | POA: Diagnosis not present

## 2016-12-13 DIAGNOSIS — N186 End stage renal disease: Secondary | ICD-10-CM | POA: Diagnosis not present

## 2016-12-15 DIAGNOSIS — D631 Anemia in chronic kidney disease: Secondary | ICD-10-CM | POA: Diagnosis not present

## 2016-12-15 DIAGNOSIS — N2581 Secondary hyperparathyroidism of renal origin: Secondary | ICD-10-CM | POA: Diagnosis not present

## 2016-12-15 DIAGNOSIS — E1129 Type 2 diabetes mellitus with other diabetic kidney complication: Secondary | ICD-10-CM | POA: Diagnosis not present

## 2016-12-15 DIAGNOSIS — N186 End stage renal disease: Secondary | ICD-10-CM | POA: Diagnosis not present

## 2016-12-15 DIAGNOSIS — E876 Hypokalemia: Secondary | ICD-10-CM | POA: Diagnosis not present

## 2016-12-18 DIAGNOSIS — D631 Anemia in chronic kidney disease: Secondary | ICD-10-CM | POA: Diagnosis not present

## 2016-12-18 DIAGNOSIS — E1129 Type 2 diabetes mellitus with other diabetic kidney complication: Secondary | ICD-10-CM | POA: Diagnosis not present

## 2016-12-18 DIAGNOSIS — N186 End stage renal disease: Secondary | ICD-10-CM | POA: Diagnosis not present

## 2016-12-18 DIAGNOSIS — N2581 Secondary hyperparathyroidism of renal origin: Secondary | ICD-10-CM | POA: Diagnosis not present

## 2016-12-18 DIAGNOSIS — E876 Hypokalemia: Secondary | ICD-10-CM | POA: Diagnosis not present

## 2016-12-20 DIAGNOSIS — E1129 Type 2 diabetes mellitus with other diabetic kidney complication: Secondary | ICD-10-CM | POA: Diagnosis not present

## 2016-12-20 DIAGNOSIS — N2581 Secondary hyperparathyroidism of renal origin: Secondary | ICD-10-CM | POA: Diagnosis not present

## 2016-12-20 DIAGNOSIS — D631 Anemia in chronic kidney disease: Secondary | ICD-10-CM | POA: Diagnosis not present

## 2016-12-20 DIAGNOSIS — E876 Hypokalemia: Secondary | ICD-10-CM | POA: Diagnosis not present

## 2016-12-20 DIAGNOSIS — N186 End stage renal disease: Secondary | ICD-10-CM | POA: Diagnosis not present

## 2016-12-22 DIAGNOSIS — E1129 Type 2 diabetes mellitus with other diabetic kidney complication: Secondary | ICD-10-CM | POA: Diagnosis not present

## 2016-12-22 DIAGNOSIS — E876 Hypokalemia: Secondary | ICD-10-CM | POA: Diagnosis not present

## 2016-12-22 DIAGNOSIS — D631 Anemia in chronic kidney disease: Secondary | ICD-10-CM | POA: Diagnosis not present

## 2016-12-22 DIAGNOSIS — N2581 Secondary hyperparathyroidism of renal origin: Secondary | ICD-10-CM | POA: Diagnosis not present

## 2016-12-22 DIAGNOSIS — N186 End stage renal disease: Secondary | ICD-10-CM | POA: Diagnosis not present

## 2016-12-25 DIAGNOSIS — E876 Hypokalemia: Secondary | ICD-10-CM | POA: Diagnosis not present

## 2016-12-25 DIAGNOSIS — D631 Anemia in chronic kidney disease: Secondary | ICD-10-CM | POA: Diagnosis not present

## 2016-12-25 DIAGNOSIS — E1129 Type 2 diabetes mellitus with other diabetic kidney complication: Secondary | ICD-10-CM | POA: Diagnosis not present

## 2016-12-25 DIAGNOSIS — N2581 Secondary hyperparathyroidism of renal origin: Secondary | ICD-10-CM | POA: Diagnosis not present

## 2016-12-25 DIAGNOSIS — N186 End stage renal disease: Secondary | ICD-10-CM | POA: Diagnosis not present

## 2016-12-27 DIAGNOSIS — D631 Anemia in chronic kidney disease: Secondary | ICD-10-CM | POA: Diagnosis not present

## 2016-12-27 DIAGNOSIS — N2581 Secondary hyperparathyroidism of renal origin: Secondary | ICD-10-CM | POA: Diagnosis not present

## 2016-12-27 DIAGNOSIS — E876 Hypokalemia: Secondary | ICD-10-CM | POA: Diagnosis not present

## 2016-12-27 DIAGNOSIS — E1129 Type 2 diabetes mellitus with other diabetic kidney complication: Secondary | ICD-10-CM | POA: Diagnosis not present

## 2016-12-27 DIAGNOSIS — N186 End stage renal disease: Secondary | ICD-10-CM | POA: Diagnosis not present

## 2016-12-28 ENCOUNTER — Other Ambulatory Visit: Payer: Self-pay | Admitting: Family Medicine

## 2016-12-28 DIAGNOSIS — Z85828 Personal history of other malignant neoplasm of skin: Secondary | ICD-10-CM | POA: Diagnosis not present

## 2016-12-28 DIAGNOSIS — Z23 Encounter for immunization: Secondary | ICD-10-CM | POA: Diagnosis not present

## 2016-12-28 DIAGNOSIS — L814 Other melanin hyperpigmentation: Secondary | ICD-10-CM | POA: Diagnosis not present

## 2016-12-28 DIAGNOSIS — D2272 Melanocytic nevi of left lower limb, including hip: Secondary | ICD-10-CM | POA: Diagnosis not present

## 2016-12-28 DIAGNOSIS — L570 Actinic keratosis: Secondary | ICD-10-CM | POA: Diagnosis not present

## 2016-12-28 DIAGNOSIS — B079 Viral wart, unspecified: Secondary | ICD-10-CM | POA: Diagnosis not present

## 2016-12-28 DIAGNOSIS — L821 Other seborrheic keratosis: Secondary | ICD-10-CM | POA: Diagnosis not present

## 2016-12-28 DIAGNOSIS — D2261 Melanocytic nevi of right upper limb, including shoulder: Secondary | ICD-10-CM | POA: Diagnosis not present

## 2016-12-28 NOTE — Telephone Encounter (Signed)
Medication filled to pharmacy as requested.   

## 2016-12-28 NOTE — Telephone Encounter (Signed)
Last OV 10/22/16 Clonazepam last filled 10/22/16 #30 with 1

## 2016-12-29 DIAGNOSIS — D631 Anemia in chronic kidney disease: Secondary | ICD-10-CM | POA: Diagnosis not present

## 2016-12-29 DIAGNOSIS — E1129 Type 2 diabetes mellitus with other diabetic kidney complication: Secondary | ICD-10-CM | POA: Diagnosis not present

## 2016-12-29 DIAGNOSIS — N2581 Secondary hyperparathyroidism of renal origin: Secondary | ICD-10-CM | POA: Diagnosis not present

## 2016-12-29 DIAGNOSIS — E876 Hypokalemia: Secondary | ICD-10-CM | POA: Diagnosis not present

## 2016-12-29 DIAGNOSIS — N186 End stage renal disease: Secondary | ICD-10-CM | POA: Diagnosis not present

## 2016-12-31 DIAGNOSIS — N186 End stage renal disease: Secondary | ICD-10-CM | POA: Diagnosis not present

## 2016-12-31 DIAGNOSIS — E876 Hypokalemia: Secondary | ICD-10-CM | POA: Diagnosis not present

## 2016-12-31 DIAGNOSIS — E1129 Type 2 diabetes mellitus with other diabetic kidney complication: Secondary | ICD-10-CM | POA: Diagnosis not present

## 2016-12-31 DIAGNOSIS — N2581 Secondary hyperparathyroidism of renal origin: Secondary | ICD-10-CM | POA: Diagnosis not present

## 2016-12-31 DIAGNOSIS — D631 Anemia in chronic kidney disease: Secondary | ICD-10-CM | POA: Diagnosis not present

## 2017-01-03 DIAGNOSIS — D631 Anemia in chronic kidney disease: Secondary | ICD-10-CM | POA: Diagnosis not present

## 2017-01-03 DIAGNOSIS — E876 Hypokalemia: Secondary | ICD-10-CM | POA: Diagnosis not present

## 2017-01-03 DIAGNOSIS — N2581 Secondary hyperparathyroidism of renal origin: Secondary | ICD-10-CM | POA: Diagnosis not present

## 2017-01-03 DIAGNOSIS — N186 End stage renal disease: Secondary | ICD-10-CM | POA: Diagnosis not present

## 2017-01-03 DIAGNOSIS — E1129 Type 2 diabetes mellitus with other diabetic kidney complication: Secondary | ICD-10-CM | POA: Diagnosis not present

## 2017-01-05 DIAGNOSIS — N186 End stage renal disease: Secondary | ICD-10-CM | POA: Diagnosis not present

## 2017-01-05 DIAGNOSIS — N2581 Secondary hyperparathyroidism of renal origin: Secondary | ICD-10-CM | POA: Diagnosis not present

## 2017-01-05 DIAGNOSIS — E876 Hypokalemia: Secondary | ICD-10-CM | POA: Diagnosis not present

## 2017-01-05 DIAGNOSIS — D631 Anemia in chronic kidney disease: Secondary | ICD-10-CM | POA: Diagnosis not present

## 2017-01-05 DIAGNOSIS — E1129 Type 2 diabetes mellitus with other diabetic kidney complication: Secondary | ICD-10-CM | POA: Diagnosis not present

## 2017-01-07 DIAGNOSIS — D631 Anemia in chronic kidney disease: Secondary | ICD-10-CM | POA: Diagnosis not present

## 2017-01-07 DIAGNOSIS — N186 End stage renal disease: Secondary | ICD-10-CM | POA: Diagnosis not present

## 2017-01-07 DIAGNOSIS — N2581 Secondary hyperparathyroidism of renal origin: Secondary | ICD-10-CM | POA: Diagnosis not present

## 2017-01-07 DIAGNOSIS — E1129 Type 2 diabetes mellitus with other diabetic kidney complication: Secondary | ICD-10-CM | POA: Diagnosis not present

## 2017-01-07 DIAGNOSIS — E876 Hypokalemia: Secondary | ICD-10-CM | POA: Diagnosis not present

## 2017-01-07 DIAGNOSIS — Z992 Dependence on renal dialysis: Secondary | ICD-10-CM | POA: Diagnosis not present

## 2017-01-30 ENCOUNTER — Other Ambulatory Visit: Payer: Self-pay | Admitting: Cardiology

## 2017-01-30 ENCOUNTER — Ambulatory Visit (INDEPENDENT_AMBULATORY_CARE_PROVIDER_SITE_OTHER): Payer: Medicare Other | Admitting: Cardiology

## 2017-01-30 ENCOUNTER — Encounter: Payer: Self-pay | Admitting: Cardiology

## 2017-01-30 VITALS — BP 108/60 | HR 80 | Ht 65.0 in | Wt 146.0 lb

## 2017-01-30 DIAGNOSIS — I5042 Chronic combined systolic (congestive) and diastolic (congestive) heart failure: Secondary | ICD-10-CM

## 2017-01-30 DIAGNOSIS — I2089 Other forms of angina pectoris: Secondary | ICD-10-CM

## 2017-01-30 DIAGNOSIS — I214 Non-ST elevation (NSTEMI) myocardial infarction: Secondary | ICD-10-CM | POA: Diagnosis not present

## 2017-01-30 DIAGNOSIS — I25708 Atherosclerosis of coronary artery bypass graft(s), unspecified, with other forms of angina pectoris: Secondary | ICD-10-CM

## 2017-01-30 DIAGNOSIS — I208 Other forms of angina pectoris: Secondary | ICD-10-CM | POA: Diagnosis not present

## 2017-01-30 DIAGNOSIS — I953 Hypotension of hemodialysis: Secondary | ICD-10-CM

## 2017-01-30 DIAGNOSIS — E785 Hyperlipidemia, unspecified: Secondary | ICD-10-CM

## 2017-01-30 DIAGNOSIS — Z955 Presence of coronary angioplasty implant and graft: Secondary | ICD-10-CM

## 2017-01-30 MED ORDER — MIDODRINE HCL 2.5 MG PO TABS: 2.5000 mg | ORAL_TABLET | Freq: Three times a day (TID) | ORAL | 6 refills | Status: DC

## 2017-01-30 MED ORDER — NITROGLYCERIN 0.4 MG SL SUBL: 0.4000 mg | SUBLINGUAL_TABLET | SUBLINGUAL | 6 refills | Status: DC | PRN

## 2017-01-30 NOTE — Patient Instructions (Signed)
MEDICATIONS INSTRUCTIONS  -- START MIDODRINE 2.5 MG  ONE TABLET THREE TIMES A DAY .     MAKE SURE YOU TAKE THE NIGHT BEFORE DIALYSIS , THE MORNING OF DIALYSIS AND THE AFTERNOON OF DIALYSIS.  ( IF BLOOD PRESSURE IS LOW DURING DIALYSIS TAKE 2 TABLETS OF MIDODRINE THAT AFTERNOON)   --- TAKE METOPROLOL TARTRATE 25 MG  AT DINNER TIME PRE-DIAYLSIS DAYS.    Your physician wants you to follow-up in [place].You will receive a reminder letter in the mail two months in advance. If you don't receive a letter, please call our office to schedule the follow-up appointment.    If you need a refill on your cardiac medications before your next appointment, please call your pharmacy.

## 2017-01-30 NOTE — Progress Notes (Signed)
PCP: Midge Minium, MD  Clinic Note: Chief Complaint  Patient presents with  . Follow-up    3-4 months -severe CAD-CABG-PCI.  Chronic stable angina.  . Chest Pain    Stable angina  . Headache  . Shortness of Breath    HPI: BOBBI KOZAKIEWICZ is a 77 y.o. male with a PMH below who presents today for 4 month follow-up for his known CAD with chronic angina and chronic in-stent restenosis issues on the RCA.Marland Kitchen 05/18/2016 for non-STEMI --> cutting balloon PTCA of mRCA ISR  CURLY MACKOWSKI was last seen in Sept 2018.  still noticing that he every now and then having angina. Less issues with angina - more related to HD related hypotension.  "He is not having any rest angina or angina decubitus.  He does have more energy related to higher BP, but is still quite sedentary & unable to do much of anything.  He is trying to get out & do some yard work, but can only do things for ~5-10 min."   Recent Hospitalizations:  n/a  Studies Personally Reviewed - (if available, images/films reviewed: From Epic Chart or Care Everywhere)  No new studies  Interval History: Kylon seems to be relatively stable from a cardiac standpoint as far as any angina goes.  He is probably going to a bottle of nitro in the course of a month.  This is seeming to be the issue whenever we had to reduce his other antianginal medications.  We have had to do such because his blood pressures being low.  What he really notes is that he has angina during dialysis when his blood pressures get low, and then otherwise all notes that mostly the evening before dialysis when his volume levels are up.  When he is euvolemic, he does not have angina as much. He really is still limited as far as any mobility doing any outside, but with simply getting around the house he is doing better overall. He does not have any PND orthopnea, or edema.  He has chronic fatigue and dyspnea with moderate exertion. Occasionally has palpitations but rare,  few/far between.     Cardiovascular ROS: positive for - chest pain, dyspnea on exertion, palpitations and exertional angina, fatigue negative for - edema, loss of consciousness, orthopnea, paroxysmal nocturnal dyspnea, rapid heart rate or syncope/near syncope or TIA/amaurosis fugax  No claudication  ROS: A comprehensive was performed. Pertinent symptoms noted in HPI> Review of Systems  Constitutional: Positive for malaise/fatigue.  HENT: Negative for congestion and nosebleeds.   Respiratory: Negative for cough and wheezing.   Cardiovascular:       Per history of present illness  Gastrointestinal: Negative for blood in stool and melena.  Musculoskeletal: Positive for joint pain and myalgias.  Neurological: Positive for dizziness (Only notable during dialysis when his blood pressures go below 90) and weakness (Generalized). Negative for focal weakness.  Endo/Heme/Allergies: Bruises/bleeds easily.  Psychiatric/Behavioral: Positive for depression and memory loss. The patient is not nervous/anxious and does not have insomnia.   All other systems reviewed and are negative.   I have reviewed and (if needed) personally updated the patient's problem list, medications, allergies, past medical and surgical history, social and family history.   Past Medical History:  Diagnosis Date  . Anemia    Likely secondary to history of GI bleed  . Anginal pain (HCC)    Chronic stable  . Anxiety   . Arthritis   . BPH (benign prostatic hyperplasia)   .  CAD (coronary artery disease) of bypass graft 2006, 2012   In 2006: Occluded SVG-OM noted (SVG-diagonal was previously occluded); 2012: Severe lesion in redo SVG-OM1 --> BMS PCI   . CAD in native artery; and the grafts 1992, 1997, 2002, 2006, 2012   CABG 1992 and redo CABG 2006  . CAD S/P percutaneous coronary angioplasty October 2012; Feb, Apr & Oct 2015; Feb 2016   a) s/p CABG 1992 and redo 2006. b) 10/12: NSTEMI: PCI to SVG-OM1 Integrity BMS 3.5 x 15  (3.85 mm) c) 2/15: UA - PCI mid RCA Xience Ap DES 2.75 x 15 (3.1 mm). d) 4/15 : UA - focal dRCA Resolute DES 2.25 x 8 (2.5 mm). e) 10/15: PCI to mRCA ISR w/ post stent lesion - Promus P 2.75 x 16 (3.25 mm). f) 2/16: NSTEMI: CBA/PTCA of  mRCA ISR (3.5 mm post-dilation); g) 7/16 ISR RCA->CBA/PTCA, residual 20%.  . Carotid arterial disease ([place])    a) Duplex 05/2012: R BULB/PROX ICA: 50-69%, L BULB/PROX ICA: 0-49%. ;; 04/29/2014: 86-76% RICA, 19-50% LICA. Patetn vertebrals,  . Chronic low back pain   . Colon cancer [facility]) 2003   colectomy for CA. no recurrence . never required  radiation or chem  . Diabetes mellitus without complication ([place])   . Dyspnea    "when I get too much Fluid"  . End stage renal disease on dialysis [facility])    Dialyzes at [facility]: Tues/Th/Sat - left upper cavity AV fistula brachiocephalic  . Gout   . Heart murmur   . History of hiatal hernia   . History of Non-ST elevated myocardial infarction (non-STEMI) 1992, 2006, 2012; 02/2013  . Hyperlipidemia   . Hypertension   . Hypothyroidism    On supplementation  . Ischemic cardiomyopathy    a) EF 45-50% by echo 2015. b) EF 50% by cath 04/2014.  Marland Kitchen LBBB (left bundle branch block)    Chronic  . Peptic ulcer disease  2004  . Plavix resistance    a) P2Y12 264 in 02/2014 - put on Brilinta but did not tolerate due to SOB. b) cannot be on Effient due to history of stroke. c) Plavix increased to 75mg  BID and Pletal added 04/2014 due to ISR.  Marland Kitchen Pneumonia   . Skin cancer 02/1999   nose  . Stroke [facility]) 1998    Past Surgical History:  Procedure Laterality Date  . AORTIC ARCH ANGIOGRAPHY N/A 05/09/2016   Procedure: Aortic Arch Angiography;  Surgeon: Waynetta Sandy, MD;  Location: [place] CV LAB;  Service: Cardiovascular;  Laterality: N/A;  . APPENDECTOMY    . AV FISTULA REPAIR Left 05/2009  . CARDIAC CATHETERIZATION  10/16/2010   patent  LIMA to the LAD , PATENT  svg TO 2nd marginals ,occluded PLA with collaterals  and SVG to the OM  had 90% stenosis  was txlge  bare - metal  3.5  Integrity ten  postdilate  36-37  mm    . CARDIAC CATHETERIZATION  03/22/2004   loss of both SVG,severe disease in prox and ostial  circ not amenable to invention. mod diease distal LAD  AFTER BYPASS GRAFT;-CVTS to evaluate   . CARDIAC CATHETERIZATION N/A 08/03/2014   Procedure: Left Heart Cath and Cors/Grafts Angiography;  Surgeon: Leonie Man, MD;  Location: [place] CV LAB;  Service: Cardiovascular;  Laterality: N/A;  . CARDIAC CATHETERIZATION N/A 08/03/2014   Procedure: Coronary Balloon Angioplasty;  Surgeon: Leonie Man, MD;  Location: [place] CV LAB;  Service:  Cardiovascular;: Aggressive Cutting Balloon - [place] Balloon PTCA of ISR site in mRCA (3 stent layer)  . CAROTID DOPPLER  05/23/2012   ABN CAROTID-- RGT BULB/PROX ICA mild to mod 50-60%;lft bulb/prox mild to mod 0-49%;left subclavian abn waveforms consistent with patients lft arm A/V fistula  . CATARACT EXTRACTION W/ INTRAOCULAR LENS  IMPLANT, BILATERAL Bilateral   . CHOLECYSTECTOMY  2009  . COLON RESECTION  2003   transverse and proximal descending w/ primar anastomosis  . COLON SURGERY  2003   "cancer"  . COLONOSCOPY    . CORONARY ANGIOPLASTY  04/03/1995   OM and Circ  . CORONARY ANGIOPLASTY  02/01/2000   CIRC  . CORONARY ANGIOPLASTY  02/09/14   Cutting Balloon PTCA of mRCA ISR 99% - 3.5 mm  . CORONARY ARTERY BYPASS GRAFT  08/22/1990   INITIAL:LIMA to LAD, SVG to  Diagonal, SVG to OM  . CORONARY ARTERY BYPASS GRAFT  2006   SVG to OM1,SVG to OM2 with patent LIMA  to LAD and occluded vein  graft to the diagonal  and occluded vein grft to OM from  prior  surgery  . CORONARY ATHERECTOMY  05/21/2016   Successful PTCA and cutting balloon atherectomy of mid dominant RCA "in-stent restenosis of the lesion that had been intervened on close to 2 years ago with an excellent result.   . CORONARY BALLOON ANGIOPLASTY N/A 05/21/2016   Procedure: Coronary Balloon  Angioplasty;  Surgeon: Lorretta Harp, MD;  Location: [place] CV LAB;  Service: Cardiovascular;  Laterality: N/A;  . DISTAL REVASCULARIZATION AND INTERVAL LIGATION (DRIL) Left 05/14/2016   Procedure: DISTAL REVASCULARIZATION AND INTERVAL LIGATION (DRIL) LEFT ARM;  Surgeon: Waynetta Sandy, MD;  Location: [place];  Service: Vascular;  Laterality: Left;  . DOPPLER ECHOCARDIOGRAPHY  01/25/2012   EF 50 to 55%  . EVENT MONITOR  01/23/2012-02/06/2012   SINUS ,LBBB,unifocal PVCs  . LEFT AND RIGHT HEART CATHETERIZATION WITH CORONARY/GRAFT ANGIOGRAM N/A 04/20/2014   Procedure: LEFT AND RIGHT HEART CATHETERIZATION WITH Beatrix Fetters;  Surgeon: Sherren Mocha, MD;  Location: [facility] CATH LAB;  Service: Cardiovascular;  Laterality: N/A;  . LEFT HEART CATH AND CORS/GRAFTS ANGIOGRAPHY N/A 05/21/2016   Procedure: LEFT HEART CATH AND CORS/GRAFTS ANGIOGRAPHY;  Surgeon: Lorretta Harp, MD;  Location: [place] CV LAB;  Service: Cardiovascular;  Laterality: N/A;  . LEFT HEART CATHETERIZATION WITH CORONARY ANGIOGRAM N/A 02/23/2013   Procedure: LEFT HEART CATHETERIZATION WITH CORONARY ANGIOGRAM;  Surgeon: Lorretta Harp, MD;  Location: [facility] CATH LAB;  Service: Cardiovascular;  Laterality: N/A;  . LEFT HEART CATHETERIZATION WITH CORONARY/GRAFT ANGIOGRAM N/A 04/30/2013   Procedure: LEFT HEART CATHETERIZATION WITH Beatrix Fetters;  Surgeon: Leonie Man, MD;  Location: [facility] CATH LAB;  Service: Cardiovascular;  Laterality: N/A;  . LEFT HEART CATHETERIZATION WITH CORONARY/GRAFT ANGIOGRAM N/A 10/15/2013   Procedure: LEFT HEART CATHETERIZATION WITH Beatrix Fetters;  Surgeon: Leonie Man, MD;  Location: [facility] CATH LAB;  Service: Cardiovascular;  Laterality: N/A;  . LEFT HEART CATHETERIZATION WITH CORONARY/GRAFT ANGIOGRAM N/A 02/09/2014   Procedure: LEFT HEART CATHETERIZATION WITH Beatrix Fetters;  Surgeon: Leonie Man, MD;  Location: [facility] CATH LAB;  Service: Cardiovascular;   Laterality: N/A;  . Lower Extremity Arterial Dopplers  October 2014   Calcified but non-occlusive peripheral arteries. No evidence of stenosis  . NM MYOCAR PERF WALL MOTION  10/12/2011   EF 54%,LV normal ; no signifiant ischemia  . PERCUTANEOUS CORONARY STENT INTERVENTION (PCI-S)  02/23/2013   mid RCA 80% & 70% - Xience  2.75 mm x 15 mm ( 3.51mm) ; LIMA-LAD patent, SVG-OM patent (stent patent); SVG-Diag patent.  Marland Kitchen PERCUTANEOUS CORONARY STENT INTERVENTION (PCI-S)  April 2015   dRCA - Integrity Resolute DES   2.25 mm x 60mm (2.5 mm)  . PERCUTANEOUS CORONARY STENT INTERVENTION (PCI-S)  Oct 15 2013   [place] Angina: mRCA ISR with post-stent stenosis -- [place] PTCA & PCI Promus Premier DES 2.75 mm x 16 mm (3.25 mm)  . POSTERIOR LUMBAR FUSION    . REVISON OF ARTERIOVENOUS FISTULA Left 17/61/6073   Procedure: PLICATION OF LEFT UPPER ARM  ARTERIOVENOUS FISTULA  ANEURYSM;  Surgeon: Rosetta Posner, MD;  Location: [place];  Service: Vascular;  Laterality: Left;  . TRANSTHORACIC ECHOCARDIOGRAM  02/23/2013   Moderate concentric hypertrophy. EF 4500%. Septal bounce. Grade 2 diastolic dysfunction (pseudo-normal - severely elevated filling pressures) mild to moderate MR and moderate LA dilation to  . UPPER EXTREMITY ANGIOGRAPHY Left 05/09/2016   Procedure: Upper Extremity Angiography;  Surgeon: Waynetta Sandy, MD;  Location: [place] CV LAB;  Service: Cardiovascular;  Laterality: Left;    Cath-PCI 05/21/2016: Culprit lesion was 95% in-stent restenosis in the mid RCA treated with cutting balloon PTCA. Also noted 100% occlusion of LAD and circumflex with 60% ramus. 50% proximal RCA.  Patent LIMA-LAD and SVG-OM.Distal LAD & OM2 75% after graft insertion   Current Meds  Medication Sig  . acetaminophen (TYLENOL) 500 MG tablet Take 1,000 mg by mouth 2 (two) times daily as needed for mild pain.   Marland Kitchen aspirin 81 MG chewable tablet Chew 1 tablet (81 mg total) by mouth daily.  Marland Kitchen atorvastatin (LIPITOR) 20 MG tablet  Take by mouth 2 (two) times daily. Take 0.5 mg twice daily  . Besifloxacin HCl (BESIVANCE) 0.6 % SUSP Place 1 drop into both eyes See admin instructions. Use eye drops 2 times daily for 2 days following injection by Dr. Zigmund Daniel (every 10 weeks)  . cilostazol (PLETAL) 100 MG tablet TAKE ONE TABLET BY MOUTH TWICE DAILY  . cinacalcet (SENSIPAR) 30 MG tablet Take 30 mg by mouth daily. TAKE 30 MG AT NIGHT AFTER DIALYSIS.  Marland Kitchen clonazePAM (KLONOPIN) 0.5 MG tablet TAKE 1 TABLET BY MOUTH TWICE DAILY AS NEEDED FOR ANXIETY  . clopidogrel (PLAVIX) 75 MG tablet TAKE ONE TABLET BY MOUTH ONCE DAILY  . ferric citrate (AURYXIA) 1 GM 210 MG(Fe) tablet Take 420 mg by mouth 3 (three) times daily with meals.  Marland Kitchen glucose blood (TRUE METRIX BLOOD GLUCOSE TEST) test strip 1 each by Other route as needed for other. Use as instructed to test sugars 2-3 times daily. Dx. E11.40  . isosorbide mononitrate (IMDUR) 120 MG 24 hr tablet TAKE 1 TABLET BY MOUTH ONCE DAILY  . levothyroxine (SYNTHROID, LEVOTHROID) 125 MCG tablet TAKE 1 TABLET BY MOUTH ONCE DAILY BEFORE BREAKFAST  . lidocaine-prilocaine (EMLA) cream Apply 1 application topically See admin instructions. Apply prior to dialysis treatments on Tuesday, Thursday and Saturday  . MELATONIN PO Take 1 tablet by mouth at bedtime as needed (sleep).  . metoprolol tartrate (LOPRESSOR) 25 MG tablet Take 25 mg by mouth daily.  . metroNIDAZOLE (FLAGYL) 500 MG tablet TAKE 1 TABLET BY MOUTH THREE TIMES DAILY  . pantoprazole (PROTONIX) 40 MG tablet Take 1 tablet (40 mg total) by mouth daily.  . [DISCONTINUED] metoprolol succinate (TOPROL-XL) 25 MG 24 hr tablet TAKE 1 TABLET BY MOUTH AT BEDTIME WITH OR IMMEDIATELY FOLLOWING A MEAL  . [DISCONTINUED] NITROSTAT 0.4 MG SL tablet PLACE 1 TABLET UNDER THE  TONGUE EVERY 5 MINUTES AS NEEDED FOR CHEST PAIN (MAX 3 DOSES PER DAY)    Allergies  Allergen Reactions  . Brilinta [Ticagrelor] Shortness Of Breath  . Shellfish Allergy Other (See Comments)      Causes gout flare-ups    Social History   Socioeconomic History  . Marital status: Married    Spouse name: None  . Number of children: 3  . Years of education: None  . Highest education level: None  Social Needs  . Financial resource strain: None  . Food insecurity - worry: None  . Food insecurity - inability: None  . Transportation needs - medical: None  . Transportation needs - non-medical: None  Occupational History  . None  Tobacco Use  . Smoking status: Never Smoker  . Smokeless tobacco: Never Used  Substance and Sexual Activity  . Alcohol use: No  . Drug use: No  . Sexual activity: No  Other Topics Concern  . None  Social History Narrative   Long-term patient of Dr. Rollene Fare.   Married father of 73, grandfather 8.   Never smoked.   Not exercising D2 hip and back pain & now Angina    family history includes Diabetes in his father and mother; Heart attack in his father and mother; Heart disease in his father and mother; Hypertension in his father and mother; Stroke in his paternal aunt and paternal uncle.  Wt Readings from Last 3 Encounters:  01/30/17 146 lb (66.2 kg)  10/22/16 146 lb 8 oz (66.5 kg)  09/28/16 146 lb 3.2 oz (66.3 kg)    PHYSICAL EXAM BP 108/60   Pulse 80   Ht 5\' 5"  (1.651 m)   Wt 146 lb (66.2 kg)   BMI 24.30 kg/m   Physical Exam  Constitutional: He is oriented to person, place, and time. No distress.  He continues to look thin and frail.  Chronically ill, but in no acute distress.  HENT:  Head: Normocephalic and atraumatic.  Continue to lose hair  Neck: Neck supple. No hepatojugular reflux and no JVD present. Carotid bruit is not present (More consistent with left subclavian bruit fromm his fistula).  Cardiovascular: Normal rate and regular rhythm.  Occasional extrasystoles are present. PMI is not displaced. Exam reveals distant heart sounds and decreased pulses (L wrist (but palpable)). Exam reveals no gallop.  No murmur (can hear a  holosystolic bruit throughout the precordium that is most likely related to his fistula) heard. Pulmonary/Chest: Effort normal. No respiratory distress. He has no wheezes. He has no rales.  Abdominal: Soft. Bowel sounds are normal. He exhibits no distension. There is no tenderness. There is no rebound.  Musculoskeletal: Normal range of motion. He exhibits no edema.  Neurological: He is alert and oriented to person, place, and time.  Skin: Skin is warm and dry. No erythema. There is pallor.  Diffuse bruising on hands & forearms.  Psychiatric: He has a normal mood and affect. His behavior is normal. Judgment and thought content normal.  Nursing note and vitals reviewed.   Adult ECG Report n/a  Other studies Reviewed: Additional studies/ records that were reviewed today include:  Recent Labs:   Lab Results  Component Value Date   CHOL 141 10/22/2016   HDL 28.30 (L) 10/22/2016   LDLCALC 84 10/22/2016   LDLDIRECT 33.5 12/22/2012   TRIG 143.0 10/22/2016   CHOLHDL 5 10/22/2016    ASSESSMENT / PLAN: Problem List Items Addressed This Visit    CAD (coronary artery disease) of  bypass graft - PCI to SVG-OM with BMS; PCI-mid RCA with a Xience DES (Chronic)    Unfortunately now is having more frequent angina.  We are really limited his what medications we can use.  He initially was doing much better after his STEMI related PTCI this past summer.  Now starting to have to require more nitroglycerin. Really do not have much more room to go up on his Imdur dose mostly because of hypotension issues and reaching max dose. I am reluctant to stop the beta-blocker.  At this point what I want him do is take his metoprolol at dinnertime. To allow for blood pressure will add midodrine.  Hopefully if his blood pressure can be increased with midodrine we would potentially be to use amlodipine again.  But I doubt it. Unfortunately, He would not be an LVAD or transplant candidate given his long-term dialysis.        Relevant Medications   nitroGLYCERIN (NITROSTAT) 0.4 MG SL tablet   midodrine (PROAMATINE) 2.5 MG tablet   Other Relevant Orders   EKG 12-Lead (Completed)   Chronic combined systolic and diastolic congestive heart failure, NYHA class 2 (HCC) (Chronic)    No real active heart failure symptoms beyond having some PND orthopnea with angina decubitus on predialysis days.  Unfortunately her treatment is limited by hypotension.  Only able to tolerate very very low-dose beta-blocker once daily. Otherwise volume control with dialysis.  They have had to increase his dry weight by a couple pounds.      Relevant Medications   nitroGLYCERIN (NITROSTAT) 0.4 MG SL tablet   midodrine (PROAMATINE) 2.5 MG tablet   Other Relevant Orders   EKG 12-Lead (Completed)   Chronic stable angina (HCC) (Chronic)    Unfortunately now is gotten a little worse.  Having to use more nitro. Hopefully by being able to use his metoprolol more frequently, we can help treat his angina.  Usually he is been having to hold his dose of metoprolol or now that.  We will add midodrine to help bolster his blood pressure.      Relevant Medications   nitroGLYCERIN (NITROSTAT) 0.4 MG SL tablet   midodrine (PROAMATINE) 2.5 MG tablet   Hyperlipidemia with target LDL less than 70 (Chronic)    Last recheck of lipids in October showed LDL of 84.  I am not sure if he can tolerate taking a higher dose of statin.  At this point we will simply monitor for now. Would potentially need to convert from Lipitor to Crestor.  We will recheck labs after next follow-up      Relevant Medications   nitroGLYCERIN (NITROSTAT) 0.4 MG SL tablet   midodrine (PROAMATINE) 2.5 MG tablet   Hypotension of hemodialysis - Primary (Chronic)    This significantly limits our ability to treat his cardiac condition. I will add midodrine 2.5 mg which he will take in the evening before, morning of shortly after dialysis. -We may simply just need to go to 3 times  daily regardless.-       Relevant Medications   nitroGLYCERIN (NITROSTAT) 0.4 MG SL tablet   midodrine (PROAMATINE) 2.5 MG tablet   Other Relevant Orders   EKG 12-Lead (Completed)   Non-ST elevation (NSTEMI) myocardial infarction (HCC) (Chronic)    Recent non-STEMI yet again this past summer time with again PTCA of the RCA lesion which is the main culprit.  No other options besides continued to do PTCA.  Thankfully his other grafts being patent.  Relevant Medications   nitroGLYCERIN (NITROSTAT) 0.4 MG SL tablet   midodrine (PROAMATINE) 2.5 MG tablet   Presence of drug coated stent in right coronary artery (Chronic)    Multiple areas of stent in the RCA with recurrent PTCA of in-stent restenosis.  He has some Plavix resistance, but is seemingly doing well on 75 of Plavix plus Pletal.  On high-dose Imdur and low-dose beta-blocker.         Current medicines are reviewed at length with the patient today. (+/- concerns) n/a The following changes have been made: n/a  Patient Instructions  MEDICATIONS INSTRUCTIONS  -- START MIDODRINE 2.5 MG  ONE TABLET THREE TIMES A DAY .     MAKE SURE YOU TAKE THE NIGHT BEFORE DIALYSIS , THE MORNING OF DIALYSIS AND THE AFTERNOON OF DIALYSIS.  ( IF BLOOD PRESSURE IS LOW DURING DIALYSIS TAKE 2 TABLETS OF MIDODRINE THAT AFTERNOON)   --- TAKE METOPROLOL TARTRATE 25 MG  AT DINNER TIME PRE-DIAYLSIS DAYS.    Your physician wants you to follow-up in [place].You will receive a reminder letter in the mail two months in advance. If you don't receive a letter, please call our office to schedule the follow-up appointment.    If you need a refill on your cardiac medications before your next appointment, please call your pharmacy.    Studies Ordered:   Orders Placed This Encounter  Procedures  . EKG 12-Lead      [name], M.D., M.S. Interventional Cardiologist   Pager # [phone] Phone # [phone] [address] [place], [place] [address]

## 2017-01-31 ENCOUNTER — Encounter: Payer: Self-pay | Admitting: Cardiology

## 2017-01-31 ENCOUNTER — Other Ambulatory Visit: Payer: Self-pay | Admitting: Physician Assistant

## 2017-01-31 NOTE — Telephone Encounter (Signed)
Last OV 10/22/16 Clonazepam last filled 12/28/16 #30 with 0

## 2017-01-31 NOTE — Assessment & Plan Note (Signed)
No real active heart failure symptoms beyond having some PND orthopnea with angina decubitus on predialysis days.  Unfortunately her treatment is limited by hypotension.  Only able to tolerate very very low-dose beta-blocker once daily. Otherwise volume control with dialysis.  They have had to increase his dry weight by a couple pounds.

## 2017-01-31 NOTE — Assessment & Plan Note (Signed)
Recent non-STEMI yet again this past summer time with again PTCA of the RCA lesion which is the main culprit.  No other options besides continued to do PTCA.  Thankfully his other grafts being patent.

## 2017-01-31 NOTE — Assessment & Plan Note (Signed)
This significantly limits our ability to treat his cardiac condition. I will add midodrine 2.5 mg which he will take in the evening before, morning of shortly after dialysis. -We may simply just need to go to 3 times daily regardless.-

## 2017-01-31 NOTE — Assessment & Plan Note (Signed)
Unfortunately now is gotten a little worse.  Having to use more nitro. Hopefully by being able to use his metoprolol more frequently, we can help treat his angina.  Usually he is been having to hold his dose of metoprolol or now that.  We will add midodrine to help bolster his blood pressure.

## 2017-01-31 NOTE — Assessment & Plan Note (Signed)
Multiple areas of stent in the RCA with recurrent PTCA of in-stent restenosis.  He has some Plavix resistance, but is seemingly doing well on 75 of Plavix plus Pletal.  On high-dose Imdur and low-dose beta-blocker.

## 2017-01-31 NOTE — Assessment & Plan Note (Signed)
Unfortunately now is having more frequent angina.  We are really limited his what medications we can use.  He initially was doing much better after his STEMI related PTCI this past summer.  Now starting to have to require more nitroglycerin. Really do not have much more room to go up on his Imdur dose mostly because of hypotension issues and reaching max dose. I am reluctant to stop the beta-blocker.  At this point what I want him do is take his metoprolol at dinnertime. To allow for blood pressure will add midodrine.  Hopefully if his blood pressure can be increased with midodrine we would potentially be to use amlodipine again.  But I doubt it. Unfortunately, He would not be an LVAD or transplant candidate given his long-term dialysis.

## 2017-01-31 NOTE — Telephone Encounter (Signed)
Medication filled to pharmacy as requested.   

## 2017-01-31 NOTE — Assessment & Plan Note (Signed)
Last recheck of lipids in October showed LDL of 84.  I am not sure if he can tolerate taking a higher dose of statin.  At this point we will simply monitor for now. Would potentially need to convert from Lipitor to Crestor.  We will recheck labs after next follow-up

## 2017-02-15 ENCOUNTER — Encounter (INDEPENDENT_AMBULATORY_CARE_PROVIDER_SITE_OTHER): Payer: Medicare Other | Admitting: Ophthalmology

## 2017-02-15 DIAGNOSIS — E113311 Type 2 diabetes mellitus with moderate nonproliferative diabetic retinopathy with macular edema, right eye: Secondary | ICD-10-CM

## 2017-02-15 DIAGNOSIS — I10 Essential (primary) hypertension: Secondary | ICD-10-CM | POA: Diagnosis not present

## 2017-02-15 DIAGNOSIS — H35033 Hypertensive retinopathy, bilateral: Secondary | ICD-10-CM

## 2017-02-15 DIAGNOSIS — H43813 Vitreous degeneration, bilateral: Secondary | ICD-10-CM | POA: Diagnosis not present

## 2017-02-15 DIAGNOSIS — E113392 Type 2 diabetes mellitus with moderate nonproliferative diabetic retinopathy without macular edema, left eye: Secondary | ICD-10-CM | POA: Diagnosis not present

## 2017-02-15 DIAGNOSIS — E11311 Type 2 diabetes mellitus with unspecified diabetic retinopathy with macular edema: Secondary | ICD-10-CM

## 2017-02-15 LAB — HM DIABETES EYE EXAM

## 2017-02-25 ENCOUNTER — Other Ambulatory Visit: Payer: Self-pay | Admitting: Cardiology

## 2017-02-25 LAB — HM DIABETES EYE EXAM

## 2017-03-05 ENCOUNTER — Encounter: Payer: Self-pay | Admitting: General Practice

## 2017-03-26 ENCOUNTER — Other Ambulatory Visit: Payer: Self-pay | Admitting: Internal Medicine

## 2017-04-22 ENCOUNTER — Other Ambulatory Visit: Payer: Self-pay

## 2017-04-22 ENCOUNTER — Ambulatory Visit (INDEPENDENT_AMBULATORY_CARE_PROVIDER_SITE_OTHER): Payer: Medicare Other | Admitting: Family Medicine

## 2017-04-22 ENCOUNTER — Encounter: Payer: Self-pay | Admitting: Family Medicine

## 2017-04-22 VITALS — BP 118/72 | HR 63 | Temp 97.9°F | Resp 16 | Ht 65.0 in | Wt 151.4 lb

## 2017-04-22 DIAGNOSIS — I10 Essential (primary) hypertension: Secondary | ICD-10-CM | POA: Diagnosis not present

## 2017-04-22 DIAGNOSIS — N186 End stage renal disease: Secondary | ICD-10-CM

## 2017-04-22 DIAGNOSIS — Z992 Dependence on renal dialysis: Secondary | ICD-10-CM | POA: Diagnosis not present

## 2017-04-22 DIAGNOSIS — I25708 Atherosclerosis of coronary artery bypass graft(s), unspecified, with other forms of angina pectoris: Secondary | ICD-10-CM

## 2017-04-22 DIAGNOSIS — E785 Hyperlipidemia, unspecified: Secondary | ICD-10-CM

## 2017-04-22 DIAGNOSIS — E0840 Diabetes mellitus due to underlying condition with diabetic neuropathy, unspecified: Secondary | ICD-10-CM

## 2017-04-22 DIAGNOSIS — E039 Hypothyroidism, unspecified: Secondary | ICD-10-CM | POA: Diagnosis not present

## 2017-04-22 LAB — HEPATIC FUNCTION PANEL
ALBUMIN: 4.3 g/dL (ref 3.5–5.2)
ALK PHOS: 95 U/L (ref 39–117)
ALT: 6 U/L (ref 0–53)
AST: 8 U/L (ref 0–37)
Bilirubin, Direct: 0.1 mg/dL (ref 0.0–0.3)
Total Bilirubin: 0.4 mg/dL (ref 0.2–1.2)
Total Protein: 6.8 g/dL (ref 6.0–8.3)

## 2017-04-22 LAB — BASIC METABOLIC PANEL
BUN: 47 mg/dL — AB (ref 6–23)
CHLORIDE: 97 meq/L (ref 96–112)
CO2: 26 mEq/L (ref 19–32)
Calcium: 9.3 mg/dL (ref 8.4–10.5)
Creatinine, Ser: 6.76 mg/dL (ref 0.40–1.50)
GFR: 8.51 mL/min — CL (ref >60.00)
GLUCOSE: 139 mg/dL — AB (ref 70–99)
POTASSIUM: 4.1 meq/L (ref 3.5–5.1)
Sodium: 139 mEq/L (ref 135–145)

## 2017-04-22 LAB — CBC WITH DIFFERENTIAL/PLATELET
BASOS PCT: 0.7 % (ref 0.0–3.0)
Basophils Absolute: 0.1 10*3/uL (ref 0.0–0.1)
EOS PCT: 2.9 % (ref 0.0–5.0)
Eosinophils Absolute: 0.3 10*3/uL (ref 0.0–0.7)
HCT: 34.7 % — ABNORMAL LOW (ref 39.0–52.0)
Hemoglobin: 11.7 g/dL — ABNORMAL LOW (ref 13.0–17.0)
LYMPHS ABS: 0.8 10*3/uL (ref 0.7–4.0)
Lymphocytes Relative: 8 % — ABNORMAL LOW (ref 12.0–46.0)
MCHC: 33.9 g/dL (ref 30.0–36.0)
MCV: 101.6 fl — ABNORMAL HIGH (ref 78.0–100.0)
MONO ABS: 0.9 10*3/uL (ref 0.1–1.0)
Monocytes Relative: 8.1 % (ref 3.0–12.0)
NEUTROS PCT: 80.3 % — AB (ref 43.0–77.0)
Neutro Abs: 8.5 10*3/uL — ABNORMAL HIGH (ref 1.4–7.7)
Platelets: 318 10*3/uL (ref 150.0–400.0)
RBC: 3.42 Mil/uL — AB (ref 4.22–5.81)
RDW: 15.2 % (ref 11.5–15.5)
WBC: 10.6 10*3/uL — ABNORMAL HIGH (ref 4.0–10.5)

## 2017-04-22 LAB — LIPID PANEL
Cholesterol: 226 mg/dL — ABNORMAL HIGH (ref 0–200)
HDL: 30.3 mg/dL — ABNORMAL LOW (ref >39.00)
NONHDL: 195.3
TRIGLYCERIDES: 222 mg/dL — AB (ref 0.0–149.0)
Total CHOL/HDL Ratio: 7
VLDL: 44.4 mg/dL — ABNORMAL HIGH (ref 0.0–40.0)

## 2017-04-22 LAB — HEMOGLOBIN A1C: Hgb A1c MFr Bld: 6.5 % (ref 4.6–6.5)

## 2017-04-22 LAB — TSH: TSH: 0.44 u[IU]/mL (ref 0.35–4.50)

## 2017-04-22 LAB — LDL CHOLESTEROL, DIRECT: LDL DIRECT: 154 mg/dL

## 2017-04-22 NOTE — Progress Notes (Signed)
   Subjective:    Patient ID: [name] [name], male    DOB: Jan 10, 1940, 77 y.o.   MRN: [id number]  HPI HTN- chronic problem, on Metoprolol 25mg  daily w/ good control.  Pt has hypotensive episodes at HD.  Continues to have CP- cardiology aware.  Occasional SOB- typically w/ low BP.  No HAs, edema.  Hypothyroid- chronic problem, on Levothyroxine 120mcg daily.  DM- chronic problem, not currently on medication.  UTD on eye exam, foot exam.  No need for microalbumin due to HD.  Hyperlipidemia- chronic problem, on Lipitor 20mg  daily.  No abd pain, N/V.  HD- chronic problem, pt is having hypotensive episodes at each treatment.  Now on Midodrine to improve BP.   Review of Systems For ROS see HPI     Objective:   Physical Exam  Constitutional: He is oriented to person, place, and time. He appears well-developed and well-nourished. No distress.  HENT:  Head: Normocephalic and atraumatic.  Eyes: Pupils are equal, round, and reactive to light. Conjunctivae and EOM are normal.  Neck: Normal range of motion. Neck supple. No thyromegaly present.  Cardiovascular: Normal rate, regular rhythm, normal heart sounds and intact distal pulses.  No murmur heard. Pulmonary/Chest: Effort normal and breath sounds normal. No respiratory distress.  Abdominal: Soft. Bowel sounds are normal. He exhibits no distension.  Musculoskeletal: He exhibits no edema.  Lymphadenopathy:    He has no cervical adenopathy.  Neurological: He is alert and oriented to person, place, and time. No cranial nerve deficit.  Skin: Skin is warm and dry.  Psychiatric: He has a normal mood and affect. His behavior is normal.  Vitals reviewed.         Assessment & Plan:

## 2017-04-22 NOTE — Assessment & Plan Note (Signed)
Chronic problem.  Well controlled on Metoprolol 25mg  daily.  Tends to become hypotensive after HD.  Pt has continuous CP- which cards is aware of.  Will continue to follow.

## 2017-04-22 NOTE — Assessment & Plan Note (Signed)
Chronic problem.  Tolerating statin w/o difficulty.  Check labs.  Adjust meds prn  

## 2017-04-22 NOTE — Patient Instructions (Signed)
Follow up in 6 months to recheck BP, cholesterol, and diabetes We'll notify you of your lab results and make any changes if needed Keep up the good work on healthy diet- you look great! Call with any questions or concerns Happy Easter!!!

## 2017-04-22 NOTE — Assessment & Plan Note (Signed)
Ongoing issue for pt.  Check labs.  Adjust meds prn

## 2017-04-22 NOTE — Assessment & Plan Note (Signed)
Chronic problem.  No longer on medication due to weight loss.  A1C has been in the normal range for the last few years.  UTD on eye exam, foot exam.  No need for microalbumin due to HD.  Will continue to follow.

## 2017-04-22 NOTE — Assessment & Plan Note (Signed)
Chronic problem.  On HD Tues/Thurs/Sat.

## 2017-04-25 ENCOUNTER — Other Ambulatory Visit: Payer: Self-pay | Admitting: Emergency Medicine

## 2017-04-25 MED ORDER — ATORVASTATIN CALCIUM 40 MG PO TABS: 40.0000 mg | ORAL_TABLET | Freq: Every day | ORAL | 3 refills | Status: AC

## 2017-05-20 ENCOUNTER — Other Ambulatory Visit: Payer: Self-pay | Admitting: Family Medicine

## 2017-06-05 ENCOUNTER — Encounter: Payer: Self-pay | Admitting: Cardiology

## 2017-06-05 ENCOUNTER — Ambulatory Visit (INDEPENDENT_AMBULATORY_CARE_PROVIDER_SITE_OTHER): Payer: Medicare Other | Admitting: Cardiology

## 2017-06-05 VITALS — BP 124/54 | HR 70 | Ht 65.0 in | Wt 146.2 lb

## 2017-06-05 DIAGNOSIS — I25119 Atherosclerotic heart disease of native coronary artery with unspecified angina pectoris: Secondary | ICD-10-CM

## 2017-06-05 DIAGNOSIS — I953 Hypotension of hemodialysis: Secondary | ICD-10-CM | POA: Diagnosis not present

## 2017-06-05 DIAGNOSIS — I25708 Atherosclerosis of coronary artery bypass graft(s), unspecified, with other forms of angina pectoris: Secondary | ICD-10-CM

## 2017-06-05 DIAGNOSIS — I208 Other forms of angina pectoris: Secondary | ICD-10-CM

## 2017-06-05 DIAGNOSIS — I5042 Chronic combined systolic (congestive) and diastolic (congestive) heart failure: Secondary | ICD-10-CM

## 2017-06-05 DIAGNOSIS — T82855D Stenosis of coronary artery stent, subsequent encounter: Secondary | ICD-10-CM | POA: Diagnosis not present

## 2017-06-05 MED ORDER — METOPROLOL SUCCINATE ER 25 MG PO TB24: 25.0000 mg | ORAL_TABLET | Freq: Every day | ORAL | 3 refills | Status: AC

## 2017-06-05 MED ORDER — CILOSTAZOL 100 MG PO TABS: 100.0000 mg | ORAL_TABLET | Freq: Two times a day (BID) | ORAL | 3 refills | Status: DC

## 2017-06-05 MED ORDER — METOPROLOL TARTRATE 25 MG PO TABS: 25.0000 mg | ORAL_TABLET | Freq: Every day | ORAL | 3 refills | Status: DC

## 2017-06-05 NOTE — Patient Instructions (Signed)
MEDICATION INSTRUCTION  NO CHANGES     Your physician recommends that you schedule a follow-up appointment in July 2019 WITH DR [facility].

## 2017-06-05 NOTE — Progress Notes (Signed)
PCP: Midge Minium, MD  Clinic Note: Chief Complaint  Patient presents with  . Follow-up  . Coronary Artery Disease    With chronic class II angina    HPI: [name] [name] is a 77 y.o. male with a PMH below who presents today for 4 month follow-up for his known CAD with chronic angina and chronic in-stent restenosis issues on the RCA.Marland Kitchen -- Most recent event: 05/18/2016 non-STEMI --> cutting balloon PTCA of mRCA ISR  [name] [name] was last seen in January 2019.  At that time he was still noticing some angina, but not as significant.  Was going to a bottle of nitroglycerin and a course of a month which is not unusual for him.  Was still having issues with angina during dialysis but alternatively also having angina decubitus on predialysis days.  He did have better energy with higher blood pressure since adjusting his dry weight.  Despite this he can only do things for 5 to 10 minutes without getting dyspnea.  Recent Hospitalizations:  n/a  Studies Personally Reviewed - (if available, images/films reviewed: From Epic Chart or Care Everywhere)  No new studies  Interval History: [name] [name] returns today actually now noticing that he still is going through a bottle\in a month, but it is happening a little bit more frequently now does not feel like he is at the point where he is having significant resting angina.  He still notes that with exertion, and around dialysis timeframe.  Occasional angina decubitus, but not otherwise at rest.   He does feel better with higher blood pressure, better energy, but still is limited to as far as activity due to his dyspnea and angina. In addition to his cardiac symptoms he still dealing with intermittent episodes of constipation followed by diarrhea.  O he denies any significant ther than having some angina decubitus and Orthopnea, heart failure symptoms.  Of PND or edema. Although he does not do much in the way of activity outside of the house, he still  tries to get out and do his  activities of daily living walking around the house.  He does simply does have chronic fatigue and dyspnea with moderate activity, that may dissipate Few and far between palpitations.   seems to be relatively stable from a cardiac standpoint as far as any angina goes.  He is probably going to a bottle of nitro in the course of a month.  This is seeming to be the issue whenever we had to reduce his other antianginal medications.  We have had to do such because his blood pressures being low.  What he really notes is that he has angina during dialysis when his blood pressures get low, and then otherwise all notes that mostly the evening before dialysis when his volume levels are up.  When he is euvolemic, he does not have angina as much.  He is not been using midodrine mostly because it has not been helpful. He really is still limited as far as any mobility doing any outside, but with simply getting around the house he is doing better overall. He does not have any PND orthopnea, or edema.  He has chronic fatigue and dyspnea with moderate exertion. Occasionally has palpitations but rare, few/far between.    Cardiovascular ROS: positive for - chest pain, dyspnea on exertion, palpitations and exertional angina, fatigue negative for - edema, loss of consciousness, orthopnea, paroxysmal nocturnal dyspnea, rapid heart rate or syncope/near syncope or TIA/amaurosis fugax  No  claudication  ROS: A comprehensive was performed. Pertinent symptoms noted in HPI> Review of Systems  Constitutional: Positive for malaise/fatigue.  HENT: Negative for congestion and nosebleeds.   Respiratory: Negative for cough and wheezing.   Cardiovascular:       Per history of present illness  Gastrointestinal: Negative for blood in stool and melena.  Musculoskeletal: Positive for joint pain and myalgias.  Neurological: Positive for dizziness (Only notable during dialysis when his blood pressures go  below 90) and weakness (Generalized). Negative for focal weakness.  Endo/Heme/Allergies: Bruises/bleeds easily.  Psychiatric/Behavioral: Positive for depression and memory loss. The patient is not nervous/anxious and does not have insomnia.   All other systems reviewed and are negative.   I have reviewed and (if needed) personally updated the patient's problem list, medications, allergies, past medical and surgical history, social and family history.   Past Medical History:  Diagnosis Date  . Anemia    Likely secondary to history of GI bleed  . Anginal pain (HCC)    Chronic stable  . Anxiety   . Arthritis   . BPH (benign prostatic hyperplasia)   . CAD (coronary artery disease) of bypass graft 2006, 2012   In 2006: Occluded SVG-OM noted (SVG-diagonal was previously occluded); 2012: Severe lesion in redo SVG-OM1 --> BMS PCI   . CAD in native artery; and the grafts 1992, 1997, 2002, 2006, 2012   CABG 1992 and redo CABG 2006  . CAD S/P percutaneous coronary angioplasty October 2012; Feb, Apr & Oct 2015; Feb 2016   a) s/p CABG 1992 and redo 2006. b) 10/12: NSTEMI: PCI to SVG-OM1 Integrity BMS 3.5 x 15 (3.85 mm) c) 2/15: UA - PCI mid RCA Xience Ap DES 2.75 x 15 (3.1 mm). d) 4/15 : UA - focal dRCA Resolute DES 2.25 x 8 (2.5 mm). e) 10/15: PCI to mRCA ISR w/ post stent lesion - Promus P 2.75 x 16 (3.25 mm). f) 2/16: NSTEMI: CBA/PTCA of  mRCA ISR (3.5 mm post-dilation); g) 7/16 ISR RCA->CBA/PTCA, residual 20%.  . Carotid arterial disease ([place])    a) Duplex 05/2012: R BULB/PROX ICA: 50-69%, L BULB/PROX ICA: 0-49%. ;; 04/29/2014: 10-62% RICA, 69-48% LICA. Patetn vertebrals,  . Chronic low back pain   . Colon cancer [facility]) 2003   colectomy for CA. no recurrence . never required  radiation or chem  . Diabetes mellitus without complication ([place])   . Dyspnea    "when I get too much Fluid"  . End stage renal disease on dialysis [facility])    Dialyzes at [facility]: Tues/Th/Sat - left upper cavity AV  fistula brachiocephalic  . Gout   . Heart murmur   . History of hiatal hernia   . History of Non-ST elevated myocardial infarction (non-STEMI) 1992, 2006, 2012; 02/2013  . Hyperlipidemia   . Hypertension   . Hypothyroidism    On supplementation  . Ischemic cardiomyopathy    a) EF 45-50% by echo 2015. b) EF 50% by cath 04/2014.  Marland Kitchen LBBB (left bundle branch block)    Chronic  . Peptic ulcer disease  2004  . Plavix resistance    a) P2Y12 264 in 02/2014 - put on Brilinta but did not tolerate due to SOB. b) cannot be on Effient due to history of stroke. c) Plavix increased to 75mg  BID and Pletal added 04/2014 due to ISR.  Marland Kitchen Pneumonia   . Skin cancer 02/1999   nose  . Stroke [facility]) 1998    Past Surgical History:  Procedure Laterality Date  . AORTIC ARCH ANGIOGRAPHY N/A 05/09/2016   Procedure: Aortic Arch Angiography;  Surgeon: Waynetta Sandy, MD;  Location: [place] CV LAB;  Service: Cardiovascular;  Laterality: N/A;  . APPENDECTOMY    . AV FISTULA REPAIR Left 05/2009  . CARDIAC CATHETERIZATION  10/16/2010   patent  LIMA to the LAD , PATENT  svg TO 2nd marginals ,occluded PLA with collaterals and SVG to the OM  had 90% stenosis  was txlge  bare - metal  3.5  Integrity ten  postdilate  36-37  mm    . CARDIAC CATHETERIZATION  03/22/2004   loss of both SVG,severe disease in prox and ostial  circ not amenable to invention. mod diease distal LAD  AFTER BYPASS GRAFT;-CVTS to evaluate   . CARDIAC CATHETERIZATION N/A 08/03/2014   Procedure: Left Heart Cath and Cors/Grafts Angiography;  Surgeon: Leonie Man, MD;  Location: [place] CV LAB;  Service: Cardiovascular;  Laterality: N/A;  . CARDIAC CATHETERIZATION N/A 08/03/2014   Procedure: Coronary Balloon Angioplasty;  Surgeon: Leonie Man, MD;  Location: [place] CV LAB;  Service: Cardiovascular;: Aggressive Cutting Balloon - [place]aggs Balloon PTCA of ISR site in mRCA (3 stent layer)  . CAROTID DOPPLER  05/23/2012   ABN CAROTID-- RGT  BULB/PROX ICA mild to mod 50-60%;lft bulb/prox mild to mod 0-49%;left subclavian abn waveforms consistent with patients lft arm A/V fistula  . CATARACT EXTRACTION W/ INTRAOCULAR LENS  IMPLANT, BILATERAL Bilateral   . CHOLECYSTECTOMY  2009  . COLON RESECTION  2003   transverse and proximal descending w/ primar anastomosis  . COLON SURGERY  2003   "cancer"  . COLONOSCOPY    . CORONARY ANGIOPLASTY  04/03/1995   OM and Circ  . CORONARY ANGIOPLASTY  02/01/2000   CIRC  . CORONARY ANGIOPLASTY  02/09/14   Cutting Balloon PTCA of mRCA ISR 99% - 3.5 mm  . CORONARY ARTERY BYPASS GRAFT  08/22/1990   INITIAL:LIMA to LAD, SVG to  Diagonal, SVG to OM  . CORONARY ARTERY BYPASS GRAFT  2006   SVG to OM1,SVG to OM2 with patent LIMA  to LAD and occluded vein  graft to the diagonal  and occluded vein grft to OM from  prior  surgery  . CORONARY ATHERECTOMY  05/21/2016   Successful PTCA and cutting balloon atherectomy of mid dominant RCA "in-stent restenosis of the lesion that had been intervened on close to 2 years ago with an excellent result.   . CORONARY BALLOON ANGIOPLASTY N/A 05/21/2016   Procedure: Coronary Balloon Angioplasty;  Surgeon: Lorretta Harp, MD;  Location: [place] CV [facility];  Service: Cardiovascular;  Laterality: N/A;  . DISTAL REVASCULARIZATION AND INTERVAL LIGATION (DRIL) Left 05/14/2016   Procedure: DISTAL REVASCULARIZATION AND INTERVAL LIGATION (DRIL) LEFT ARM;  Surgeon: Waynetta Sandy, MD;  Location: [place];  Service: Vascular;  Laterality: Left;  . DOPPLER ECHOCARDIOGRAPHY  01/25/2012   EF 50 to 55%  . EVENT MONITOR  01/23/2012-02/06/2012   SINUS ,LBBB,unifocal PVCs  . LEFT AND RIGHT HEART CATHETERIZATION WITH CORONARY/GRAFT ANGIOGRAM N/A 04/20/2014   Procedure: LEFT AND RIGHT HEART CATHETERIZATION WITH Beatrix Fetters;  Surgeon: Sherren Mocha, MD;  Location: [facility] CATH LAB;  Service: Cardiovascular;  Laterality: N/A;  . LEFT HEART CATH AND CORS/GRAFTS ANGIOGRAPHY N/A  05/21/2016   Procedure: LEFT HEART CATH AND CORS/GRAFTS ANGIOGRAPHY;  Surgeon: Lorretta Harp, MD;  Location: [place] CV LAB;  Service: Cardiovascular;  Laterality: N/A;  . LEFT HEART CATHETERIZATION WITH CORONARY  ANGIOGRAM N/A 02/23/2013   Procedure: LEFT HEART CATHETERIZATION WITH CORONARY ANGIOGRAM;  Surgeon: Lorretta Harp, MD;  Location: [facility] CATH LAB;  Service: Cardiovascular;  Laterality: N/A;  . LEFT HEART CATHETERIZATION WITH CORONARY/GRAFT ANGIOGRAM N/A 04/30/2013   Procedure: LEFT HEART CATHETERIZATION WITH Beatrix Fetters;  Surgeon: Leonie Man, MD;  Location: [facility] CATH LAB;  Service: Cardiovascular;  Laterality: N/A;  . LEFT HEART CATHETERIZATION WITH CORONARY/GRAFT ANGIOGRAM N/A 10/15/2013   Procedure: LEFT HEART CATHETERIZATION WITH Beatrix Fetters;  Surgeon: Leonie Man, MD;  Location: [facility] CATH LAB;  Service: Cardiovascular;  Laterality: N/A;  . LEFT HEART CATHETERIZATION WITH CORONARY/GRAFT ANGIOGRAM N/A 02/09/2014   Procedure: LEFT HEART CATHETERIZATION WITH Beatrix Fetters;  Surgeon: Leonie Man, MD;  Location: [facility] CATH LAB;  Service: Cardiovascular;  Laterality: N/A;  . Lower Extremity Arterial Dopplers  October 2014   Calcified but non-occlusive peripheral arteries. No evidence of stenosis  . NM MYOCAR PERF WALL MOTION  10/12/2011   EF 54%,LV normal ; no signifiant ischemia  . PERCUTANEOUS CORONARY STENT INTERVENTION (PCI-S)  02/23/2013   mid RCA 80% & 70% - Xience 2.75 mm x 15 mm ( 3.29mm) ; LIMA-LAD patent, SVG-OM patent (stent patent); SVG-Diag patent.  Marland Kitchen PERCUTANEOUS CORONARY STENT INTERVENTION (PCI-S)  April 2015   dRCA - Integrity Resolute DES   2.25 mm x 64mm (2.5 mm)  . PERCUTANEOUS CORONARY STENT INTERVENTION (PCI-S)  Oct 15 2013   [place] Angina: mRCA ISR with post-stent stenosis -- Cuting PTCA & PCI Promus Premier DES 2.75 mm x 16 mm (3.25 mm)  . POSTERIOR LUMBAR FUSION    . REVISON OF ARTERIOVENOUS FISTULA Left 47/82/9562    Procedure: PLICATION OF LEFT UPPER ARM  ARTERIOVENOUS FISTULA  ANEURYSM;  Surgeon: Rosetta Posner, MD;  Location: [place];  Service: Vascular;  Laterality: Left;  . TRANSTHORACIC ECHOCARDIOGRAM  02/23/2013   Moderate concentric hypertrophy. EF 4500%. Septal bounce. Grade 2 diastolic dysfunction (pseudo-normal - severely elevated filling pressures) mild to moderate MR and moderate LA dilation to  . UPPER EXTREMITY ANGIOGRAPHY Left 05/09/2016   Procedure: Upper Extremity Angiography;  Surgeon: Waynetta Sandy, MD;  Location: [place] CV LAB;  Service: Cardiovascular;  Laterality: Left;    Cath-PCI 05/21/2016: Culprit lesion was 95% in-stent restenosis in the mid RCA treated with cutting balloon PTCA. Also noted 100% occlusion of LAD and circumflex with 60% ramus. 50% proximal RCA.  Patent LIMA-LAD and SVG-OM.Distal LAD & OM2 75% after graft insertion   Current Meds  Medication Sig  . acetaminophen (TYLENOL) 500 MG tablet Take 1,000 mg by mouth 2 (two) times daily as needed for mild pain.   Marland Kitchen aspirin 81 MG chewable tablet Chew 1 tablet (81 mg total) by mouth daily.  Marland Kitchen atorvastatin (LIPITOR) 40 MG tablet Take 1 tablet (40 mg total) by mouth daily.  Marland Kitchen Besifloxacin HCl (BESIVANCE) 0.6 % SUSP Place 1 drop into both eyes See admin instructions. Use eye drops 2 times daily for 2 days following injection by Dr. Zigmund Daniel (every 10 weeks)  . cilostazol (PLETAL) 100 MG tablet Take 1 tablet (100 mg total) by mouth 2 (two) times daily.  . cinacalcet (SENSIPAR) 30 MG tablet Take 30 mg by mouth daily.  . clonazePAM (KLONOPIN) 0.5 MG tablet TAKE 1 TABLET BY MOUTH TWICE DAILY AS NEEDED FOR ANXIETY  . clopidogrel (PLAVIX) 75 MG tablet TAKE 1 TABLET BY MOUTH ONCE DAILY  . ferric citrate (AURYXIA) 1 GM 210 MG(Fe) tablet Take 420 mg by mouth 3 (  three) times daily with meals.  Marland Kitchen glucose blood (TRUE METRIX BLOOD GLUCOSE TEST) test strip 1 each by Other route as needed for other. Use as instructed to test sugars 2-3  times daily. Dx. E11.40  . isosorbide mononitrate (IMDUR) 120 MG 24 hr tablet TAKE 1 TABLET BY MOUTH ONCE DAILY  . levothyroxine (SYNTHROID, LEVOTHROID) 125 MCG tablet TAKE 1 TABLET BY MOUTH ONCE DAILY BEFORE BREAKFAST  . lidocaine-prilocaine (EMLA) cream Apply 1 application topically See admin instructions. Apply prior to dialysis treatments on Tuesday, Thursday and Saturday  . MELATONIN PO Take 1 tablet by mouth at bedtime as needed (sleep).  . metoprolol succinate (TOPROL-XL) 25 MG 24 hr tablet Take 1 tablet (25 mg total) by mouth daily.  . metoprolol tartrate (LOPRESSOR) 25 MG tablet Take 1 tablet (25 mg total) by mouth daily.  . metroNIDAZOLE (FLAGYL) 500 MG tablet TAKE 1 TABLET BY MOUTH THREE TIMES DAILY  . midodrine (PROAMATINE) 2.5 MG tablet Take 1 tablet (2.5 mg total) by mouth 3 (three) times daily with meals.  . pantoprazole (PROTONIX) 40 MG tablet TAKE 1 TABLET BY MOUTH ONCE DAILY  . [DISCONTINUED] cilostazol (PLETAL) 100 MG tablet TAKE 1 TABLET BY MOUTH TWICE DAILY  . [DISCONTINUED] metoprolol succinate (TOPROL-XL) 25 MG 24 hr tablet TAKE 1 TABLET BY MOUTH AT BEDTIME WITH  OR  IMMEDIATELY  FOLLOWING  A  MEAL  . [DISCONTINUED] metoprolol tartrate (LOPRESSOR) 25 MG tablet Take 25 mg by mouth daily.    Allergies  Allergen Reactions  . Brilinta [Ticagrelor] Shortness Of Breath  . Shellfish Allergy Other (See Comments)    Causes gout flare-ups   Social History   Tobacco Use  . Smoking status: Never Smoker  . Smokeless tobacco: Never Used  Substance Use Topics  . Alcohol use: No  . Drug use: No   Social History   Social History Narrative   Long-term patient of Dr. Rollene Fare.   Married father of 9, grandfather 63.   Never smoked.   Not exercising D2 hip and back pain & now Angina     family history includes Diabetes in his father and mother; Heart attack in his father and mother; Heart disease in his father and mother; Hypertension in his father and mother; Stroke in his  paternal aunt and paternal uncle.  Wt Readings from Last 3 Encounters:  06/05/17 146 lb 3.2 oz (66.3 kg)  04/22/17 151 lb 6 oz (68.7 kg)  01/30/17 146 lb (66.2 kg)    PHYSICAL EXAM BP (!) 124/54   Pulse 70   Ht 5\' 5"  (1.651 m)   Wt 146 lb 3.2 oz (66.3 kg)   SpO2 98%   BMI 24.33 kg/m   Physical Exam  Constitutional: He is oriented to person, place, and time. No distress.  He continues to look thin and frail.  Chronically ill, but in no acute distress.  HENT:  Head: Normocephalic and atraumatic.  Continue to lose hair  Neck: Neck supple. No hepatojugular reflux and no JVD present. Carotid bruit is not present (More consistent with left subclavian bruit fromm his fistula).  Cardiovascular: Normal rate and regular rhythm.  Occasional extrasystoles are present. PMI is not displaced. Exam reveals distant heart sounds and decreased pulses (L wrist (but palpable)). Exam reveals no gallop.  No murmur (can hear a holosystolic bruit throughout the precordium that is most likely related to his fistula) heard. Pulmonary/Chest: Effort normal. No respiratory distress. He has no wheezes. He has no rales.  Abdominal: Soft. Bowel  sounds are normal. He exhibits no distension. There is no tenderness. There is no rebound.  Musculoskeletal: Normal range of motion. He exhibits no edema.  Neurological: He is alert and oriented to person, place, and time.  Skin: Skin is warm and dry. No erythema. There is pallor.  Diffuse bruising on hands & forearms.  Psychiatric: He has a normal mood and affect. His behavior is normal. Judgment and thought content normal.  Nursing note and vitals reviewed.   Adult ECG Report n/a  Other studies Reviewed: Additional studies/ records that were reviewed today include:  Recent Labs:   Lab Results  Component Value Date   CHOL 226 (H) 04/22/2017   HDL 30.30 (L) 04/22/2017   LDLCALC 84 10/22/2016   LDLDIRECT 154.0 04/22/2017   TRIG 222.0 (H) 04/22/2017   CHOLHDL 7  04/22/2017    ASSESSMENT / PLAN: Problem List Items Addressed This Visit    Hypotension of hemodialysis (Chronic)    They have adjusted his dry weight which is definitely helped his blood pressure levels.  We have really been unable to titrate cardiac medications.  He seems to be tolerating them the metoprolol succinate, but has not been able to tolerate amlodipine. Volume adjustment by nephrology, but avoid rapid  shifting of volumes.      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  Has had several repeat angioplasty procedures performed requiring the use of guide liner catheter. Recommend continue medical management for now including beta-blocker and Imdur.      Coronary artery disease involving native coronary artery of native heart with angina pectoris ([place]) - Primary (Chronic)    Chronic stable angina now somewhat progressively worsening.  He is now over a year out from his last PTCA.  Monitor closely.  Refill nitroglycerin.   He is on max dose Of Imdur as well as max tolerated dose of beta-blocker.  He is not having to use additional dose of beta-blocker. He is on combination of Pletal and Plavix as well as aspirin. Continue statin.  Continue to monitor closely.  He is a tendency to have rapid acceleration of symptoms that would lead to cardiac catheterization.  Unfortunately do not see any other option besides continued angioplasty of the lesion in question.  Evaluate is within stent/stent makes other treatment options less of an option.      Relevant Medications   metoprolol tartrate (LOPRESSOR) 25 MG tablet   metoprolol succinate (TOPROL-XL) 25 MG 24 hr tablet   Chronic stable angina (HCC) (Chronic)    On fairly aggressive regimen.  Unable to  use Ranexa. On his high dose of Toprol as he can tolerate along with max dose  Imdur.  Blood pressure would not tolerate all meds.      Relevant Medications   metoprolol tartrate (LOPRESSOR) 25 MG tablet   metoprolol succinate (TOPROL-XL) 25 MG 24 hr tablet   Chronic combined systolic and diastolic congestive heart failure, NYHA class 2 (HCC) (Chronic)    Most recent echocardiogram evaluations have been relatively normal source pump function goes.  Low size mildly reduced EF only. Volume level controlled by dialysis. He is on Toprol, and will not tolerate afterload reduction.      Relevant Medications   metoprolol  tartrate (LOPRESSOR) 25 MG tablet   metoprolol succinate (TOPROL-XL) 25 MG 24 hr tablet     As is usually the case, I spent at least 25 to 30 minutes with the patient discussing his condition and progression as well as potential medical options.  50% is spent in direct patient counseling.  Current medicines are reviewed at length with the patient today. (+/- concerns) n/a The following changes have been made: n/a  Patient Instructions  MEDICATION INSTRUCTION  NO CHANGES     Your physician recommends that you schedule a follow-up appointment in July 2019 WITH DR [facility].     Studies Ordered:   No orders of the defined types were placed in this encounter.     [name], M.D., M.S. Interventional Cardiologist   Pager # [phone] Phone # [phone] [place], [place] [address]

## 2017-06-08 ENCOUNTER — Encounter: Payer: Self-pay | Admitting: Cardiology

## 2017-06-08 NOTE — Assessment & Plan Note (Signed)
On fairly aggressive regimen.  Unable to use Ranexa. On his high dose of Toprol as he can tolerate along with max dose  Imdur.  Blood pressure would not tolerate all meds.

## 2017-06-08 NOTE — Assessment & Plan Note (Signed)
Has had frequent episodes of angina related to closure of the RCA stent at a focal  site of ISR.  Has had several repeat angioplasty procedures performed requiring the use of guide liner catheter. Recommend continue medical management for now including beta-blocker and Imdur.

## 2017-06-08 NOTE — Assessment & Plan Note (Signed)
Chronic stable angina now somewhat progressively worsening.  He is now over a year out from his last PTCA.  Monitor closely.  Refill nitroglycerin.   He is on max dose Of Imdur as well as max tolerated dose of beta-blocker.  He is not having to use additional dose of beta-blocker. He is on combination of Pletal and Plavix as well as aspirin. Continue statin.  Continue to monitor closely.  He is a tendency to have rapid acceleration of symptoms that would lead to cardiac catheterization.  Unfortunately do not see any other option besides continued angioplasty of the lesion in question.  Evaluate is within stent/stent makes other treatment options less of an option.

## 2017-06-08 NOTE — Assessment & Plan Note (Signed)
Most recent echocardiogram evaluations have been relatively normal source pump function goes.  Low size mildly reduced EF only. Volume level controlled by dialysis. He is on Toprol, and will not tolerate afterload reduction.

## 2017-06-08 NOTE — Assessment & Plan Note (Signed)
They have adjusted his dry weight which is definitely helped his blood pressure levels.  We have really been unable to titrate cardiac medications.  He seems to be tolerating them the metoprolol succinate, but has not been able to tolerate amlodipine. Volume adjustment by nephrology, but avoid rapid  shifting of volumes.

## 2017-06-10 ENCOUNTER — Other Ambulatory Visit: Payer: Self-pay | Admitting: Cardiology

## 2017-06-10 NOTE — Telephone Encounter (Signed)
Rx sent to pharmacy   

## 2017-06-13 ENCOUNTER — Other Ambulatory Visit: Payer: Self-pay | Admitting: Family Medicine

## 2017-06-13 NOTE — Telephone Encounter (Signed)
Last OV 04/22/17 Clonazepam last filled 01/31/17 #30 with 3

## 2017-06-21 ENCOUNTER — Encounter (INDEPENDENT_AMBULATORY_CARE_PROVIDER_SITE_OTHER): Payer: Medicare Other | Admitting: Ophthalmology

## 2017-06-21 DIAGNOSIS — H43813 Vitreous degeneration, bilateral: Secondary | ICD-10-CM

## 2017-06-21 DIAGNOSIS — E11311 Type 2 diabetes mellitus with unspecified diabetic retinopathy with macular edema: Secondary | ICD-10-CM

## 2017-06-21 DIAGNOSIS — E113392 Type 2 diabetes mellitus with moderate nonproliferative diabetic retinopathy without macular edema, left eye: Secondary | ICD-10-CM

## 2017-06-21 DIAGNOSIS — H35033 Hypertensive retinopathy, bilateral: Secondary | ICD-10-CM

## 2017-06-21 DIAGNOSIS — E113311 Type 2 diabetes mellitus with moderate nonproliferative diabetic retinopathy with macular edema, right eye: Secondary | ICD-10-CM

## 2017-06-21 DIAGNOSIS — I10 Essential (primary) hypertension: Secondary | ICD-10-CM | POA: Diagnosis not present

## 2017-07-19 ENCOUNTER — Ambulatory Visit (INDEPENDENT_AMBULATORY_CARE_PROVIDER_SITE_OTHER): Payer: Medicare Other | Admitting: Podiatry

## 2017-07-19 ENCOUNTER — Encounter: Payer: Self-pay | Admitting: Podiatry

## 2017-07-19 VITALS — BP 138/54 | HR 59

## 2017-07-19 DIAGNOSIS — E1142 Type 2 diabetes mellitus with diabetic polyneuropathy: Secondary | ICD-10-CM | POA: Diagnosis not present

## 2017-07-19 DIAGNOSIS — M79675 Pain in left toe(s): Secondary | ICD-10-CM | POA: Diagnosis not present

## 2017-07-19 DIAGNOSIS — M79674 Pain in right toe(s): Secondary | ICD-10-CM | POA: Diagnosis not present

## 2017-07-19 DIAGNOSIS — B351 Tinea unguium: Secondary | ICD-10-CM | POA: Diagnosis not present

## 2017-07-19 NOTE — Patient Instructions (Signed)

## 2017-07-21 NOTE — Progress Notes (Signed)
Subjective: [name] [name] is a 77 yo WM who presents to clinic today with diabetes and diabetic neuropathy. He states he attempted to trim his toenails and cut his toe. Staff at his dialysis center checks his feet every visit and noticed the cut to his left great toe. Staff at dialysis center advised him to see a Podiatrist.   Dialysis days: TTS   Essential hypertension   Chronic combined systolic and diastolic congestive heart failure, NYHA class 2 (HCC)   CAD (coronary artery disease) of bypass graft - PCI to SVG-OM with BMS; PCI-mid RCA with a Xience DES   Non-ST elevation (NSTEMI) myocardial infarction [facility])   CAD S/P percutaneous coronary angioplasty   Coronary artery disease involving native coronary artery of native heart with angina pectoris (HCC)   Hypotension of hemodialysis   Chronic stable angina (HCC)   Carotid arterial disease (HCC)   LBBB (left bundle branch block)  Respiratory   HIATAL HERNIA  Digestive   Personal history of other diseases of digestive system  Endocrine   Hypothyroidism   Diabetes mellitus with neuropathy ([place])  Nervous and Auditory   OM (otitis media)  Musculoskeletal and Integument   RASH-NONVESICULAR   Neoplasm of uncertain behavior of skin  Genitourinary   RENAL INSUFFICIENCY, CHRONIC   ESRD on dialysis ([place])  Other   Hyperlipidemia with target LDL less than 70   COLON CANCER, HX OF   Orthopnea   Presence of drug coated stent in right coronary artery   Plavix resistance   Coronary stent restenosis due to scar tissue   RINGWORM   Gout   ANEMIA, IRON DEFICIENCY   LOW BACK PAIN   DIARRHEA, CHRONIC   CEREBROVASCULAR ACCIDENT, HX OF   Breast lump   Medication management   Routine general medical examination at a health care facility   Watery diarrhea   Left shoulder pain   Anxiety state   2004 Peptic ulcer disease  2003 Colon cancer ([place])   02/1999 Skin cancer   1998 Stroke [facility])  Date Unknown Anemia   Date  Unknown Anginal pain ([place])   Date Unknown Anxiety  Date Unknown Arthritis  Date Unknown BPH (benign prostatic hyperplasia)  2006, 2012 CAD (coronary artery disease) of bypass graft   1992, 1997, 2002, 2006, 2012 CAD in native artery; and the grafts   October 2012; Feb, Apr & Oct 2015; Feb 2016 CAD S/P percutaneous coronary angioplasty   Date Unknown Carotid arterial disease ([place])   Date Unknown Chronic low back pain  Date Unknown Diabetes mellitus without complication ([place])  Date Unknown Dyspnea   Date Unknown End stage renal disease on dialysis [facility])   Date Unknown Gout  Date Unknown Heart murmur  Date Unknown History of hiatal hernia  1992, 2006, 2012; 02/2013 History of Non-ST elevated myocardial infarction (non-STEMI)  Date Unknown Hyperlipidemia  Date Unknown Hypertension  Date Unknown Hypothyroidism   Date Unknown Ischemic cardiomyopathy   Date Unknown LBBB (left bundle branch block)   Date Unknown Plavix resistance   Date Unknown Pneumonia   05/21/2016 Coronary atherectomy   05/21/2016 Left heart cath and cors/grafts angiography (N/A)   05/21/2016 Coronary balloon angioplasty (N/A)   05/14/2016 Distal revascularization and interval ligation (dril) (Left)   05/09/2016 Aortic arch angiography (N/A)   05/09/2016 Upper extremity angiography (Left)   10/28/2015 Revison of arteriovenous fistula (Left)   08/03/2014 Cardiac catheterization (N/A)   08/03/2014 Cardiac catheterization (N/A)   04/20/2014 Left and right heart catheterization with coronary/graft angiogram (N/A)  02/09/2014 Left heart catheterization with coronary/graft angiogram (N/A)   02/09/14 Coronary angioplasty   Oct 15 2013 Percutaneous coronary stent intervention (pci-s)   10/15/2013 Left heart catheterization with coronary/graft angiogram (N/A)   04/30/2013 Left heart catheterization with coronary/graft angiogram (N/A)   April 2015 Percutaneous coronary stent intervention (pci-s)   02/23/2013 Percutaneous coronary stent  intervention (pci-s)   02/23/2013 Transthoracic echocardiogram   02/23/2013 Left heart catheterization with coronary angiogram (N/A)   October 2014 Lower Extremity Arterial Dopplers [Other]   05/23/2012 CAROTID DOPPLER [Other]   01/25/2012 Doppler echocardiography   10/12/2011 Nm myocar perf wall motion   10/16/2010 Cardiac catheterization   05/2009 Av fistula repair (Left)  2009 Cholecystectomy  03/22/2004 Cardiac catheterization   2006 Coronary artery bypass graft   2003 Colon resection   2003 Colon surgery   02/01/2000 Coronary angioplasty   04/03/1995 Coronary angioplasty   08/22/1990 Coronary artery bypass graft    Medications    acetaminophen (TYLENOL) 500 MG tablet    aspirin 81 MG chewable tablet    atorvastatin (LIPITOR) 40 MG tablet    Besifloxacin HCl (BESIVANCE) 0.6 % SUSP    cilostazol (PLETAL) 100 MG tablet    cinacalcet (SENSIPAR) 30 MG tablet    clonazePAM (KLONOPIN) 0.5 MG tablet    clopidogrel (PLAVIX) 75 MG tablet    ferric citrate (AURYXIA) 1 GM 210 MG(Fe) tablet    glucose blood (TRUE METRIX BLOOD GLUCOSE TEST) test strip    isosorbide mononitrate (IMDUR) 120 MG 24 hr tablet    levothyroxine (SYNTHROID, LEVOTHROID) 125 MCG tablet    lidocaine-prilocaine (EMLA) cream    MELATONIN PO    metoprolol succinate (TOPROL-XL) 25 MG 24 hr tablet    metoprolol tartrate (LOPRESSOR) 25 MG tablet    metroNIDAZOLE (FLAGYL) 500 MG tablet    midodrine (PROAMATINE) 2.5 MG tablet    nitroGLYCERIN (NITROSTAT) 0.4 MG SL tablet    pantoprazole (PROTONIX) 40 MG tablet    nitroGLYCERIN (NITROSTAT) 0.4 MG SL tablet(Expired)    Allergies     Brilinta [Ticagrelor]Shortness Of Breath  Shellfish AllergyOther (See Comments)   Tobacco History   Smoking Status  Never Smoker  Smokeless Tobacco Status  Never Used   Family History Collapse by Default  Collapse by Default      Mother (Deceased) Heart disease    Hypertension    Diabetes    Heart attack         Father  (Deceased) Heart disease    Hypertension    Diabetes    Heart attack         Brother         Child         Child         Child         Paternal Aunt Stroke         Paternal Uncle Stroke            Neg Hx Colon cancer    Esophageal cancer    Immunizations/Injections   Hep A / Hep B10/01/2012, 05/08/2012, 04/08/2012   Influenza, High Dose Seasonal PF9/15/2018   Influenza,inj,Quad PF,6+ Mos10/03/2015, 10/09/2014, 11/18/2013, . . .   PPD Test10/17/2015   Pneumococcal Conjugate-137/13/2016   Pneumococcal Polysaccharide-234/25/2012    ROS: see HPI; all other systems negative   Objective: Vitals:   07/19/17 0841  BP: (!) 138/54  Pulse: (!) 59     Vascular Examination: Capillary refill time <3 seconds x 10 digits Dorsalis pedis and Posterior  tibial pulses present b/l No digital hair x 10 digits Skin temperature gradient WNL  Dermatological Examination: Skin with normal turgor, texture and tone b/l Toenails 1-5 b/l discolored, thick, dystrophic with subungual debris and pain with palpation to nailbeds due to thickness of nails.  Healed laceration noted on distal tip of left great toe. Scab intact with no surrounding erythema, no edema, no drainage, no flocculence.  Musculoskeletal: Muscle strength 5/5 to all LE muscle groups HAV with bunion b/l.  Neurological: Sensation diminished with 10 gram monofilament. Vibratory sensation diminished  Arterial doppler studies from October 2014 showed no significant leg artery blockages, just calcified vessels.  Assessment: 1. Painful onychomycosis toenails 1-5 b/l 2. NIDDM with Diabetic neuropathy 3. Old laceration left great toe secondary to self-inflicted trauma during nail trimming  Plan: 1. 2019 ABN signed today. 2. Discussed diabetic foot care principles. He was educated to avoid using sharp instrumentation to cut toenails. He may use an Tax adviser to file them should they become too long. Diabetic Foot Care  instructions dispensed on today. 3. Toenails 1-5 b/l were debrided in length and girth without iatrogenic bleeding. 4. Patient to continue soft, supportive shoe gear 5. Patient to report any pedal injuries to medical professional  6. Follow up 3 months.  7. Patient/POA to call should there be a concern in the interim.

## 2017-07-24 ENCOUNTER — Ambulatory Visit (INDEPENDENT_AMBULATORY_CARE_PROVIDER_SITE_OTHER): Payer: Medicare Other | Admitting: Cardiology

## 2017-07-24 ENCOUNTER — Encounter: Payer: Self-pay | Admitting: Cardiology

## 2017-07-24 VITALS — BP 132/62 | HR 58 | Ht 65.0 in | Wt 147.0 lb

## 2017-07-24 DIAGNOSIS — Z789 Other specified health status: Secondary | ICD-10-CM

## 2017-07-24 DIAGNOSIS — Z992 Dependence on renal dialysis: Secondary | ICD-10-CM

## 2017-07-24 DIAGNOSIS — N186 End stage renal disease: Secondary | ICD-10-CM

## 2017-07-24 DIAGNOSIS — I25119 Atherosclerotic heart disease of native coronary artery with unspecified angina pectoris: Secondary | ICD-10-CM

## 2017-07-24 DIAGNOSIS — I953 Hypotension of hemodialysis: Secondary | ICD-10-CM | POA: Diagnosis not present

## 2017-07-24 DIAGNOSIS — Z9861 Coronary angioplasty status: Secondary | ICD-10-CM | POA: Diagnosis not present

## 2017-07-24 DIAGNOSIS — Z955 Presence of coronary angioplasty implant and graft: Secondary | ICD-10-CM | POA: Diagnosis not present

## 2017-07-24 DIAGNOSIS — I25708 Atherosclerosis of coronary artery bypass graft(s), unspecified, with other forms of angina pectoris: Secondary | ICD-10-CM | POA: Diagnosis not present

## 2017-07-24 DIAGNOSIS — I5042 Chronic combined systolic (congestive) and diastolic (congestive) heart failure: Secondary | ICD-10-CM | POA: Diagnosis not present

## 2017-07-24 DIAGNOSIS — I208 Other forms of angina pectoris: Secondary | ICD-10-CM | POA: Diagnosis not present

## 2017-07-24 DIAGNOSIS — T82855D Stenosis of coronary artery stent, subsequent encounter: Secondary | ICD-10-CM

## 2017-07-24 DIAGNOSIS — I2089 Other forms of angina pectoris: Secondary | ICD-10-CM

## 2017-07-24 DIAGNOSIS — I2510 Atherosclerotic heart disease of native coronary artery without angina pectoris: Secondary | ICD-10-CM | POA: Diagnosis not present

## 2017-07-24 DIAGNOSIS — E785 Hyperlipidemia, unspecified: Secondary | ICD-10-CM

## 2017-07-24 MED ORDER — CLOPIDOGREL BISULFATE 75 MG PO TABS: 75.0000 mg | ORAL_TABLET | Freq: Every day | ORAL | 3 refills | Status: AC

## 2017-07-24 NOTE — Patient Instructions (Signed)
Medication Instructions:  Your physician has recommended you make the following change in your medication:  1) STOP Pletal 2) STOP Asprin 3) STOP Protonix   Labwork: none  Testing/Procedures: none  Follow-Up: Your physician recommends that you schedule a follow-up appointment in: 3 months with Dr. Ellyn Hack.   Any Other Special Instructions Will Be Listed Below (If Applicable).     If you need a refill on your cardiac medications before your next appointment, please call your pharmacy.

## 2017-07-24 NOTE — Progress Notes (Signed)
PCP: Midge Minium, MD  Clinic Note: Chief Complaint  Patient presents with  . Follow-up    stable angina  . Coronary Artery Disease    muliple PCIs & recurrent PTCA of ungrafted RCA. LCA grafts patent    HPI: [name] [name] is a 77 y.o. male with a PMH below who presents today for 4 month follow-up for his known CAD with chronic angina and chronic in-stent restenosis issues on the RCA.Marland Kitchen -- Most recent event: 05/18/2016 non-STEMI --> cutting balloon PTCA of mRCA ISR  [name] [name] was last seen on Jun 05, 2017. -- using sublingual nitroglycerin more frequently, ? a bottle a month,-- not to point of significant angina with minimal exertion / @ rest (besides angina decubitus).  Angina usually associated with predialysis times.  Sometimes on predialysis nights he may have some angina decubitus. He also notes feeling better with higher pressure as far as energy goes, but is still limited in doing any activity based on his dyspnea and angina.  Unfortunately, in order to allow his blood pressure to be higher, we had to take back his antianginal medicines as then coincided with him having to take more nitroglycerin.  He noted that the midodrine really did not help him much.  Recent Hospitalizations:  n/a  Studies Personally Reviewed - (if available, images/films reviewed: From Epic Chart or Care Everywhere)  No new studies  Interval History: [name] [name] returns today actually now noticing that he really feels about the same as it did last time.  No change.  He has some days where he will not have to use nitroglycerin at all, and another weeks will go 3 or 4 days taking 3 to 4 tablets of nitroglycerin a day.  Most often times his angina decubitus is the issue on the nights before dialysis.  What he started doing is taking his metoprolol on the evenings after he has dialysis only.  With doing this his blood pressure has been better around dialysis.  The biggest issues is that we had to take his  medications away to avoid hemodialysis related hypotension. Of late he has been having another unusual sensation of a more sharp stabbing type pain that is much more fleeting and not his classic anginal symptom.  He did take nitroglycerin once and it relieved it.  He is not sure what the symptom is.  He has started to get out & try to do more - gardening & walking around the yard -- they just came back from the beach -- walked out to see their grandson's wedding.  He has stopped his Pletal and has not really noticed any difference.  He also says that his PCP recently increased his atorvastatin to 40 mg he does not notice any change in symptoms.  Since on dialysis he denies any edema.  No PND, but he does have the orthopnea with angina decubitus. He has rare palpitations.  Nothing to worry about.  No syncope or near syncope unless his blood pressure was low during dialysis.  No TIA or amaurosis fugax symptoms. He really does not walk enough, but does not really notice claudication symptoms.   ROS: A comprehensive was performed. Pertinent symptoms noted in HPI> Review of Systems  Constitutional: Positive for malaise/fatigue. Negative for chills, fever and weight loss.  HENT: Negative for congestion and nosebleeds.   Respiratory: Negative for cough and wheezing.   Cardiovascular:       Per history of present illness  Gastrointestinal: Negative for  abdominal pain, blood in stool, heartburn and melena.  Musculoskeletal: Positive for joint pain. Negative for falls and myalgias.  Neurological: Positive for dizziness (Only notable during dialysis when his blood pressures go below 90 - happening less frequently since taking less Metoprolol) and weakness (Generalized). Negative for focal weakness.  Endo/Heme/Allergies: Bruises/bleeds easily.  Psychiatric/Behavioral: Positive for depression and memory loss. The patient is not nervous/anxious and does not have insomnia.   All other systems reviewed and  are negative.   I have reviewed and (if needed) personally updated the patient's problem list, medications, allergies, past medical and surgical history, social and family history.   Past Medical History:  Diagnosis Date  . Anemia    Likely secondary to history of GI bleed  . Anginal pain (HCC)    Chronic stable  . Anxiety   . Arthritis   . BPH (benign prostatic hyperplasia)   . CAD (coronary artery disease) of bypass graft 2006, 2012   In 2006: Occluded SVG-OM noted (SVG-diagonal was previously occluded); 2012: Severe lesion in redo SVG-OM1 --> BMS PCI   . CAD in native artery; and the grafts 1992, 1997, 2002, 2006, 2012   CABG 1992 and redo CABG 2006  . CAD S/P percutaneous coronary angioplasty October 2012; Feb, Apr & Oct 2015; Feb 2016   a) s/p CABG 1992 and redo 2006. b) 10/12: NSTEMI: PCI to SVG-OM1 Integrity BMS 3.5 x 15 (3.85 mm) c) 2/15: UA - PCI mid RCA Xience Ap DES 2.75 x 15 (3.1 mm). d) 4/15 : UA - focal dRCA Resolute DES 2.25 x 8 (2.5 mm). e) 10/15: PCI to mRCA ISR w/ post stent lesion - Promus P 2.75 x 16 (3.25 mm). f) 2/16: NSTEMI: CBA/PTCA of  mRCA ISR (3.5 mm post-dilation); g) 7/16 ISR RCA->CBA/PTCA, residual 20%.  . Carotid arterial disease ([place])    a) Duplex 05/2012: R BULB/PROX ICA: 50-69%, L BULB/PROX ICA: 0-49%. ;; 04/29/2014: 21-22% RICA, 48-25% LICA. Patetn vertebrals,  . Chronic low back pain   . Colon cancer [facility]) 2003   colectomy for CA. no recurrence . never required  radiation or chem  . Diabetes mellitus without complication ([place])   . Dyspnea    "when I get too much Fluid"  . End stage renal disease on dialysis [facility])    Dialyzes at [facility]: Tues/Th/Sat - left upper cavity AV fistula brachiocephalic  . Gout   . Heart murmur   . History of hiatal hernia   . History of Non-ST elevated myocardial infarction (non-STEMI) 1992, 2006, 2012; 02/2013  . Hyperlipidemia   . Hypertension   . Hypothyroidism    On supplementation  . Ischemic cardiomyopathy     a) EF 45-50% by echo 2015. b) EF 50% by cath 04/2014.  Marland Kitchen LBBB (left bundle branch block)    Chronic  . Peptic ulcer disease  2004  . Plavix resistance    a) P2Y12 264 in 02/2014 - put on Brilinta but did not tolerate due to SOB. b) cannot be on Effient due to history of stroke. c) Plavix increased to 75mg  BID and Pletal added 04/2014 due to ISR.  Marland Kitchen Pneumonia   . Skin cancer 02/1999   nose  . Stroke [facility]) 1998    Past Surgical History:  Procedure Laterality Date  . AORTIC ARCH ANGIOGRAPHY N/A 05/09/2016   Procedure: Aortic Arch Angiography;  Surgeon: Waynetta Sandy, MD;  Location: [place] CV LAB;  Service: Cardiovascular;  Laterality: N/A;  . APPENDECTOMY    .  AV FISTULA REPAIR Left 05/2009  . CARDIAC CATHETERIZATION  10/16/2010   patent  LIMA to the LAD , PATENT  svg TO 2nd marginals ,occluded PLA with collaterals and SVG to the OM  had 90% stenosis  was txlge  bare - metal  3.5  Integrity ten  postdilate  36-37  mm    . CARDIAC CATHETERIZATION  03/22/2004   loss of both SVG,severe disease in prox and ostial  circ not amenable to invention. mod diease distal LAD  AFTER BYPASS GRAFT;-CVTS to evaluate   . CARDIAC CATHETERIZATION N/A 08/03/2014   Procedure: Left Heart Cath and Cors/Grafts Angiography;  Surgeon: Leonie Man, MD;  Location: [place] CV LAB;  Service: Cardiovascular;  Laterality: N/A;  . CARDIAC CATHETERIZATION N/A 08/03/2014   Procedure: Coronary Balloon Angioplasty;  Surgeon: Leonie Man, MD;  Location: [place] CV LAB;  Service: Cardiovascular;: Aggressive Cutting Balloon - [place] Balloon PTCA of ISR site in mRCA (3 stent layer)  . CAROTID DOPPLER  05/23/2012   ABN CAROTID-- RGT BULB/PROX ICA mild to mod 50-60%;lft bulb/prox mild to mod 0-49%;left subclavian abn waveforms consistent with patients lft arm A/V fistula  . CATARACT EXTRACTION W/ INTRAOCULAR LENS  IMPLANT, BILATERAL Bilateral   . CHOLECYSTECTOMY  2009  . COLON RESECTION  2003   transverse  and proximal descending w/ primar anastomosis  . COLON SURGERY  2003   "cancer"  . COLONOSCOPY    . CORONARY ANGIOPLASTY  04/03/1995   OM and Circ  . CORONARY ANGIOPLASTY  02/01/2000   CIRC  . CORONARY ANGIOPLASTY  02/09/14   Cutting Balloon PTCA of mRCA ISR 99% - 3.5 mm  . CORONARY ARTERY BYPASS GRAFT  08/22/1990   INITIAL:LIMA to LAD, SVG to  Diagonal, SVG to OM  . CORONARY ARTERY BYPASS GRAFT  2006   SVG to OM1,SVG to OM2 with patent LIMA  to LAD and occluded vein  graft to the diagonal  and occluded vein grft to OM from  prior  surgery  . CORONARY ATHERECTOMY  05/21/2016   Successful PTCA and cutting balloon atherectomy of mid dominant RCA "in-stent restenosis of the lesion that had been intervened on close to 2 years ago with an excellent result.   . CORONARY BALLOON ANGIOPLASTY N/A 05/21/2016   Procedure: Coronary Balloon Angioplasty;  Surgeon: Lorretta Harp, MD;  Location: [place] CV LAB;  Service: Cardiovascular:: Redo Cutting Balloon angioplasty RCA ISR  . DISTAL REVASCULARIZATION AND INTERVAL LIGATION (DRIL) Left 05/14/2016   Procedure: DISTAL REVASCULARIZATION AND INTERVAL LIGATION (DRIL) LEFT ARM;  Surgeon: Waynetta Sandy, MD;  Location: [place];  Service: Vascular;  Laterality: Left;  . DOPPLER ECHOCARDIOGRAPHY  01/25/2012   EF 50 to 55%  . EVENT MONITOR  01/23/2012-02/06/2012   SINUS ,LBBB,unifocal PVCs  . LEFT AND RIGHT HEART CATHETERIZATION WITH CORONARY/GRAFT ANGIOGRAM N/A 04/20/2014   Procedure: LEFT AND RIGHT HEART CATHETERIZATION WITH Beatrix Fetters;  Surgeon: Sherren Mocha, MD;  Location: [facility] CATH LAB;  Service: Cardiovascular;  Laterality: N/A;  . LEFT HEART CATH AND CORS/GRAFTS ANGIOGRAPHY N/A 05/21/2016   Procedure: LEFT HEART CATH AND CORS/GRAFTS ANGIOGRAPHY;  Surgeon: Lorretta Harp, MD;  Location: [place] CV LAB;  Service: Cardiovascular;; 95% in-stent restenosis of mid RCA treated with Cutting Balloon PTCA.  6% ramus.  Patent LIMA-LAD  and SVG-OM.  Distal LAD and OM 2 -75% after graft insertion  . LEFT HEART CATHETERIZATION WITH CORONARY ANGIOGRAM N/A 02/23/2013   Procedure: LEFT HEART CATHETERIZATION WITH CORONARY ANGIOGRAM;  Surgeon: Lorretta Harp, MD;  Location: [facility] CATH LAB;  Service: Cardiovascular;  Laterality: N/A;  . LEFT HEART CATHETERIZATION WITH CORONARY/GRAFT ANGIOGRAM N/A 04/30/2013   Procedure: LEFT HEART CATHETERIZATION WITH Beatrix Fetters;  Surgeon: Leonie Man, MD;  Location: [facility] CATH LAB;  Service: Cardiovascular;  Laterality: N/A;  . LEFT HEART CATHETERIZATION WITH CORONARY/GRAFT ANGIOGRAM N/A 10/15/2013   Procedure: LEFT HEART CATHETERIZATION WITH Beatrix Fetters;  Surgeon: Leonie Man, MD;  Location: [facility] CATH LAB;  Service: Cardiovascular;  Laterality: N/A;  . LEFT HEART CATHETERIZATION WITH CORONARY/GRAFT ANGIOGRAM N/A 02/09/2014   Procedure: LEFT HEART CATHETERIZATION WITH Beatrix Fetters;  Surgeon: Leonie Man, MD;  Location: [facility] CATH LAB;  Service: Cardiovascular;  Laterality: N/A;  . Lower Extremity Arterial Dopplers  October 2014   Calcified but non-occlusive peripheral arteries. No evidence of stenosis  . NM MYOCAR PERF WALL MOTION  10/12/2011   EF 54%,LV normal ; no signifiant ischemia  . PERCUTANEOUS CORONARY STENT INTERVENTION (PCI-S)  02/23/2013   mid RCA 80% & 70% - Xience 2.75 mm x 15 mm ( 3.58mm) ; LIMA-LAD patent, SVG-OM patent (stent patent); SVG-Diag patent.  Marland Kitchen PERCUTANEOUS CORONARY STENT INTERVENTION (PCI-S)  April 2015   dRCA - Integrity Resolute DES   2.25 mm x 57mm (2.5 mm)  . PERCUTANEOUS CORONARY STENT INTERVENTION (PCI-S)  Oct 15 2013   [place] Angina: mRCA ISR with post-stent stenosis -- C[place] PTCA & PCI Promus Premier DES 2.75 mm x 16 mm (3.25 mm)  . POSTERIOR LUMBAR FUSION    . REVISON OF ARTERIOVENOUS FISTULA Left 08/67/6195   Procedure: PLICATION OF LEFT UPPER ARM  ARTERIOVENOUS FISTULA  ANEURYSM;  Surgeon: Rosetta Posner, MD;  Location: [place];   Service: Vascular;  Laterality: Left;  . TRANSTHORACIC ECHOCARDIOGRAM  02/23/2013   Moderate concentric hypertrophy. EF 4500%. Septal bounce. Grade 2 diastolic dysfunction (pseudo-normal - severely elevated filling pressures) mild to moderate MR and moderate LA dilation to  . UPPER EXTREMITY ANGIOGRAPHY Left 05/09/2016   Procedure: Upper Extremity Angiography;  Surgeon: Waynetta Sandy, MD;  Location: [place] CV LAB;  Service: Cardiovascular;  Laterality: Left;     Current Meds  Medication Sig  . acetaminophen (TYLENOL) 500 MG tablet Take 1,000 mg by mouth 2 (two) times daily as needed for mild pain.   Marland Kitchen atorvastatin (LIPITOR) 40 MG tablet Take 1 tablet (40 mg total) by mouth daily.  Marland Kitchen Besifloxacin HCl (BESIVANCE) 0.6 % SUSP Place 1 drop into both eyes See admin instructions. Use eye drops 2 times daily for 2 days following injection by Dr. Zigmund Daniel (every 10 weeks)  . cinacalcet (SENSIPAR) 30 MG tablet Take 30 mg by mouth daily.  . clonazePAM (KLONOPIN) 0.5 MG tablet TAKE 1 TABLET BY MOUTH TWICE DAILY AS NEEDED FOR ANXIETY  . clopidogrel (PLAVIX) 75 MG tablet Take 1 tablet (75 mg total) by mouth daily.  . ferric citrate (AURYXIA) 1 GM 210 MG(Fe) tablet Take 420 mg by mouth 3 (three) times daily with meals.  Marland Kitchen glucose blood (TRUE METRIX BLOOD GLUCOSE TEST) test strip 1 each by Other route as needed for other. Use as instructed to test sugars 2-3 times daily. Dx. E11.40  . isosorbide mononitrate (IMDUR) 120 MG 24 hr tablet TAKE 1 TABLET BY MOUTH ONCE DAILY  . levothyroxine (SYNTHROID, LEVOTHROID) 125 MCG tablet TAKE 1 TABLET BY MOUTH ONCE DAILY BEFORE BREAKFAST  . lidocaine-prilocaine (EMLA) cream Apply 1 application topically See admin instructions. Apply prior to dialysis treatments on Tuesday, Thursday  and Saturday  . MELATONIN PO Take 1 tablet by mouth at bedtime as needed (sleep).  . metoprolol succinate (TOPROL-XL) 25 MG 24 hr tablet Take 1 tablet (25 mg total) by mouth daily.    . metoprolol tartrate (LOPRESSOR) 25 MG tablet Take 1 tablet (25 mg total) by mouth daily.  . metroNIDAZOLE (FLAGYL) 500 MG tablet TAKE 1 TABLET BY MOUTH THREE TIMES DAILY  . midodrine (PROAMATINE) 2.5 MG tablet Take 1 tablet (2.5 mg total) by mouth 3 (three) times daily with meals. (Patient taking differently: Take 2.5 mg by mouth 3 (three) times daily with meals. )  . nitroGLYCERIN (NITROSTAT) 0.4 MG SL tablet DISSOLVE ONE TABLET UNDER THE TONGUE EVERY 5 MINUTES AS NEEDED FOR CHEST PAIN. DO NOT EXCEED A TOTAL OF 3 DOSES IN 15 MINUTES  . [DISCONTINUED] aspirin 81 MG chewable tablet Chew 1 tablet (81 mg total) by mouth daily.  . [DISCONTINUED] cilostazol (PLETAL) 100 MG tablet Take 1 tablet (100 mg total) by mouth 2 (two) times daily.  . [DISCONTINUED] clopidogrel (PLAVIX) 75 MG tablet TAKE 1 TABLET BY MOUTH ONCE DAILY  . [DISCONTINUED] pantoprazole (PROTONIX) 40 MG tablet TAKE 1 TABLET BY MOUTH ONCE DAILY    Allergies  Allergen Reactions  . Brilinta [Ticagrelor] Shortness Of Breath  . Shellfish Allergy Other (See Comments)    Causes gout flare-ups   Social History   Tobacco Use  . Smoking status: Never Smoker  . Smokeless tobacco: Never Used  Substance Use Topics  . Alcohol use: No  . Drug use: No    Social History   Social History Narrative   Long-term patient of Dr. Rollene Fare.   Married father of 30, grandfather 43.   Never smoked.   Not exercising D2 hip and back pain & now Angina    family history includes Diabetes in his father and mother; Heart attack in his father and mother; Heart disease in his father and mother; Hypertension in his father and mother; Stroke in his paternal aunt and paternal uncle.  Wt Readings from Last 3 Encounters:  07/24/17 147 lb (66.7 kg)  06/05/17 146 lb 3.2 oz (66.3 kg)  04/22/17 151 lb 6 oz (68.7 kg)    PHYSICAL EXAM BP 132/62   Pulse (!) 58   Ht 5\' 5"  (1.651 m)   Wt 147 lb (66.7 kg)   SpO2 98%   BMI 24.46 kg/m   Physical Exam   Constitutional: He is oriented to person, place, and time. No distress.  Chronically ill-appearing.  No overt distress.  Well-groomed.  HENT:  Head: Normocephalic and atraumatic.  Has essentially lost all of his hair. Poor dentition  Neck: Neck supple. No hepatojugular reflux and no JVD present. Carotid bruit is not present (More consistent with left subclavian bruit fromm his fistula).  Cardiovascular: Normal rate and regular rhythm.  Occasional extrasystoles are present. PMI is not displaced. Exam reveals distant heart sounds and decreased pulses (L wrist (but palpable) - mildy reduced pedal pulses). Exam reveals no gallop, no S4 and no friction rub.  No murmur (can hear a holosystolic bruit throughout the precordium that is most likely related to his fistula) heard. Can hear bruit of fistula  Pulmonary/Chest: Effort normal and breath sounds normal. No respiratory distress. He has no wheezes. He has no rales.  Abdominal: Soft. Bowel sounds are normal. He exhibits no distension. There is no tenderness. There is no rebound.  No HSM  Musculoskeletal: Normal range of motion. He exhibits no edema.  Neurological:  He is alert and oriented to person, place, and time.  Skin: Skin is warm and dry. No erythema. There is pallor.  Diffuse bruising on hands & forearms.  Psychiatric: He has a normal mood and affect. His behavior is normal. Judgment and thought content normal.  Nursing note and vitals reviewed.   Adult ECG Report n/a  Other studies Reviewed: Additional studies/ records that were reviewed today include:  Recent Labs:   Lab Results  Component Value Date   CHOL 226 (H) 04/22/2017   HDL 30.30 (L) 04/22/2017   LDLCALC 84 10/22/2016   LDLDIRECT 154.0 04/22/2017   TRIG 222.0 (H) 04/22/2017   CHOLHDL 7 04/22/2017    ASSESSMENT / PLAN: Problem List Items Addressed This Visit    Presence of drug coated stent in right coronary artery (Chronic)    He really bruises a lot and bleeds so  much whenever he is access for dialysis.  Will probably safe stopping aspirin.  He is already stopped Pletal and had some improvement.  Continue Plavix      Plavix resistance (Chronic)   Hypotension of hemodialysis (Chronic)   Hyperlipidemia with target LDL less than 70 (Chronic)    Recent dose adjustment on his atorvastatin based on lipid panel from April. LDL 84.  Hopefully with increased Statin atorvastatin this will get back down to goal.  Would like it to be less than 50.      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  I do anticipate there will probably be another cardiac catheterization within the year.  Unfortunately not many good options.  He basically has stent with in-stent and probably very initially did not have full stent expansion.  Unfortunately treating this region actually is complicated because it requires the use of a GuideLiner just to get to the lesion. -- recommend HIGH pressure cutting & post-dilation angioplasty of the lesion& surrounding area if he does end up back in the cath lab.      Coronary artery disease involving native coronary artery of native heart with angina pectoris ([place]) - Primary (Chronic)   Chronic stable angina (HCC) (Chronic)    Had previously been on high-dose Toprol, but not able to tolerate anymore.  He did best when he was able to take amlodipine and carvedilol, that is long since been impossible. He is only on max dose Imdur.  Continue PRN nitroglycerin.  May want to consider trying nitroglycerin patch.      Chronic combined systolic and diastolic congestive heart failure, NYHA class 2 (HCC) (Chronic)    Amazingly, his EF  has remained normal despite frequent MIs, CABG and PCI's. Really with borderline hypotension most the time, this has not been an issue.  His volume is strictly  based on dialysis now.  Unable to tolerate any type of afterload reduction.  Only on low low-dose metoprolol.      CAD S/P percutaneous coronary angioplasty (Chronic)    Include much of a little objective of the RCA and has had multiple in-stent restenosis PTCA procedures. He has been shown to have some decreased sensitivity to Plavix, but he is far enough out now but I think we can probably just be once daily Plavix.  He did like a bruise that he was having on Pletal so he stopped it and he has  not noticed any change in symptoms.  Question the benefit from that since nothing really has changed.  Although I would hope not to, it would be okay to hold Plavix if necessary for procedures.  (5-7 days for most invasive procedures, but 7 days per spinal procedure)      CAD (coronary artery disease) of bypass graft - PCI to SVG-OM with BMS; PCI-mid RCA with a Xience DES (Chronic)    Still has baseline - "STABLE" CLASS II Angina - with Angina Decubitus (on pre-HD evenings) Limited medical options in a HD patient - cannot use Ranexa  Can only tolerate very low dose intermittent Metoprolol  Leaves [place] with maxed out dose of Imdur.  Thankfully, his symptoms seem somewhat stable now about 14 months out from his most recent MI-PCI.  Interestingly all of his events are involving the RCA.  He is relatively comfortable with the amount of activities having since he is going some days without taking nitroglycerin.  He is comfortable extending his follow-up to every 3 months as opposed every 2 now.  I anticipate that within the next 12 months will probably end up going back to the Cath Lab.        As is usually the case, I spent at least 25 to 30 minutes with the patient discussing his condition and progression as well as potential medical options.  50% is spent in direct patient counseling.  Current medicines are reviewed at length with the patient today. (+/- concerns) n/a The following changes have been  made: n/a  Patient Instructions  Medication Instructions:  Your physician has recommended you make the following change in your medication:  1) STOP Pletal 2) STOP Asprin 3) STOP Protonix   Labwork: none  Testing/Procedures: none  Follow-Up: Your physician recommends that you schedule a follow-up appointment in: 3 months with Dr. Ellyn Hack.   Any Other Special Instructions Will Be Listed Below (If Applicable).     If you need a refill on your cardiac medications before your next appointment, please call your pharmacy.     Studies Ordered:   No orders of the defined types were placed in this encounter.     [name], M.D., M.S. Interventional Cardiologist   Pager # [phone] Phone # [phone] [address] [place], [place] [address]

## 2017-07-27 ENCOUNTER — Encounter: Payer: Self-pay | Admitting: Cardiology

## 2017-07-27 NOTE — Assessment & Plan Note (Signed)
He really bruises a lot and bleeds so much whenever he is access for dialysis.  Will probably safe stopping aspirin.  He is already stopped Pletal and had some improvement.  Continue Plavix

## 2017-07-27 NOTE — Assessment & Plan Note (Signed)
Had previously been on high-dose Toprol, but not able to tolerate anymore.  He did best when he was able to take amlodipine and carvedilol, that is long since been impossible. He is only on max dose Imdur.  Continue PRN nitroglycerin.  May want to consider trying nitroglycerin patch.

## 2017-07-27 NOTE — Assessment & Plan Note (Signed)
Recent dose adjustment on his atorvastatin based on lipid panel from April. LDL 84.  Hopefully with increased Statin atorvastatin this will get back down to goal.  Would like it to be less than 50.

## 2017-07-27 NOTE — Assessment & Plan Note (Addendum)
Amazingly, his EF  has remained normal despite frequent MIs, CABG and PCI's. Really with borderline hypotension most the time, this has not been an issue.  His volume is strictly based on dialysis now.  Unable to tolerate any type of afterload reduction.  Only on low low-dose metoprolol.

## 2017-07-27 NOTE — Assessment & Plan Note (Signed)
Include much of a little objective of the RCA and has had multiple in-stent restenosis PTCA procedures. He has been shown to have some decreased sensitivity to Plavix, but he is far enough out now but I think we can probably just be once daily Plavix.  He did like a bruise that he was having on Pletal so he stopped it and he has not noticed any change in symptoms.  Question the benefit from that since nothing really has changed.  Although I would hope not to, it would be okay to hold Plavix if necessary for procedures.  (5-7 days for most invasive procedures, but 7 days per spinal procedure)

## 2017-07-27 NOTE — Assessment & Plan Note (Signed)
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  Thankfully, his symptoms seem somewhat stable now about 14 months out from his most recent MI-PCI.  Interestingly all of his events are involving the RCA.  He is relatively comfortable with the amount of activities having since he is going some days without taking nitroglycerin.  He is comfortable extending his follow-up to every 3 months as opposed every 2 now.  I anticipate that within the next 12 months will probably end up going back to the Cath Lab.

## 2017-07-27 NOTE — Assessment & Plan Note (Signed)
Thankfully, he is now lasting over a year with each PTCI effort as opposed to previously when it was every 3 months.  I do anticipate there will probably be another cardiac catheterization within the year.  Unfortunately not many good options.  He basically has stent with in-stent and probably very initially did not have full stent expansion.  Unfortunately treating this region actually is complicated because it requires the use of a GuideLiner just to get to the lesion. -- recommend HIGH pressure cutting & post-dilation angioplasty of the lesion& surrounding area if he does end up back in the cath lab.

## 2017-07-30 ENCOUNTER — Encounter: Payer: Self-pay | Admitting: Physician Assistant

## 2017-07-30 ENCOUNTER — Other Ambulatory Visit: Payer: Self-pay

## 2017-07-30 ENCOUNTER — Ambulatory Visit (INDEPENDENT_AMBULATORY_CARE_PROVIDER_SITE_OTHER): Payer: Medicare Other | Admitting: Physician Assistant

## 2017-07-30 VITALS — BP 100/60 | HR 79 | Temp 98.1°F | Resp 15 | Ht 65.0 in | Wt 144.4 lb

## 2017-07-30 DIAGNOSIS — Z9861 Coronary angioplasty status: Secondary | ICD-10-CM

## 2017-07-30 DIAGNOSIS — J029 Acute pharyngitis, unspecified: Secondary | ICD-10-CM | POA: Diagnosis not present

## 2017-07-30 DIAGNOSIS — I2510 Atherosclerotic heart disease of native coronary artery without angina pectoris: Secondary | ICD-10-CM | POA: Diagnosis not present

## 2017-07-30 LAB — POCT RAPID STREP A (OFFICE): Rapid Strep A Screen: NEGATIVE

## 2017-07-30 MED ORDER — NYSTATIN 100000 UNIT/ML MT SUSP: 5.0000 mL | Freq: Three times a day (TID) | OROMUCOSAL | 0 refills | Status: DC

## 2017-07-30 NOTE — Progress Notes (Signed)
Patient presents to clinic today c/o 6 days of sore throat with dysphagia and odynophagia. Denies fever, chills, URI symptoms. Denies reflux or heart burn. Denies abdominal pain, nausea or vomiting. Denies recent travel or sick contact.   Past Medical History:  Diagnosis Date  . Anemia    Likely secondary to history of GI bleed  . Anginal pain (HCC)    Chronic stable  . Anxiety   . Arthritis   . BPH (benign prostatic hyperplasia)   . CAD (coronary artery disease) of bypass graft 2006, 2012   In 2006: Occluded SVG-OM noted (SVG-diagonal was previously occluded); 2012: Severe lesion in redo SVG-OM1 --> BMS PCI   . CAD in native artery; and the grafts 1992, 1997, 2002, 2006, 2012   CABG 1992 and redo CABG 2006  . CAD S/P percutaneous coronary angioplasty October 2012; Feb, Apr & Oct 2015; Feb 2016   a) s/p CABG 1992 and redo 2006. b) 10/12: NSTEMI: PCI to SVG-OM1 Integrity BMS 3.5 x 15 (3.85 mm) c) 2/15: UA - PCI mid RCA Xience Ap DES 2.75 x 15 (3.1 mm). d) 4/15 : UA - focal dRCA Resolute DES 2.25 x 8 (2.5 mm). e) 10/15: PCI to mRCA ISR w/ post stent lesion - Promus P 2.75 x 16 (3.25 mm). f) 2/16: NSTEMI: CBA/PTCA of  mRCA ISR (3.5 mm post-dilation); g) 7/16 ISR RCA->CBA/PTCA, residual 20%.  . Carotid arterial disease ([place])    a) Duplex 05/2012: R BULB/PROX ICA: 50-69%, L BULB/PROX ICA: 0-49%. ;; 04/29/2014: 01-75% RICA, 10-25% LICA. Patetn vertebrals,  . Chronic low back pain   . Colon cancer [facility]) 2003   colectomy for CA. no recurrence . never required  radiation or chem  . Diabetes mellitus without complication ([place])   . Dyspnea    "when I get too much Fluid"  . End stage renal disease on dialysis [facility])    Dialyzes at [facility]: Tues/Th/Sat - left upper cavity AV fistula brachiocephalic  . Gout   . Heart murmur   . History of hiatal hernia   . History of Non-ST elevated myocardial infarction (non-STEMI) 1992, 2006, 2012; 02/2013  . Hyperlipidemia   . Hypertension   .  Hypothyroidism    On supplementation  . Ischemic cardiomyopathy    a) EF 45-50% by echo 2015. b) EF 50% by cath 04/2014.  Marland Kitchen LBBB (left bundle branch block)    Chronic  . Peptic ulcer disease  2004  . Plavix resistance    a) P2Y12 264 in 02/2014 - put on Brilinta but did not tolerate due to SOB. b) cannot be on Effient due to history of stroke. c) Plavix increased to 75mg  BID and Pletal added 04/2014 due to ISR.  Marland Kitchen Pneumonia   . Skin cancer 02/1999   nose  . Stroke [facility]) 1998    Current Outpatient Medications on File Prior to Visit  Medication Sig Dispense Refill  . acetaminophen (TYLENOL) 500 MG tablet Take 1,000 mg by mouth 2 (two) times daily as needed for mild pain.     Marland Kitchen atorvastatin (LIPITOR) 40 MG tablet Take 1 tablet (40 mg total) by mouth daily. 90 tablet 3  . Besifloxacin HCl (BESIVANCE) 0.6 % SUSP Place 1 drop into both eyes See admin instructions. Use eye drops 2 times daily for 2 days following injection by Dr. Zigmund Daniel (every 10 weeks)    . cinacalcet (SENSIPAR) 30 MG tablet Take 30 mg by mouth daily.    . clonazePAM (KLONOPIN) 0.5  MG tablet TAKE 1 TABLET BY MOUTH TWICE DAILY AS NEEDED FOR ANXIETY 30 tablet 3  . clopidogrel (PLAVIX) 75 MG tablet Take 1 tablet (75 mg total) by mouth daily. 90 tablet 3  . ferric citrate (AURYXIA) 1 GM 210 MG(Fe) tablet Take 420 mg by mouth 3 (three) times daily with meals.    Marland Kitchen glucose blood (TRUE METRIX BLOOD GLUCOSE TEST) test strip 1 each by Other route as needed for other. Use as instructed to test sugars 2-3 times daily. Dx. E11.40 100 each 12  . isosorbide mononitrate (IMDUR) 120 MG 24 hr tablet TAKE 1 TABLET BY MOUTH ONCE DAILY 90 tablet 2  . levothyroxine (SYNTHROID, LEVOTHROID) 125 MCG tablet TAKE 1 TABLET BY MOUTH ONCE DAILY BEFORE BREAKFAST 90 tablet 1  . lidocaine-prilocaine (EMLA) cream Apply 1 application topically See admin instructions. Apply prior to dialysis treatments on Tuesday, Thursday and Saturday    . MELATONIN PO Take 1  tablet by mouth at bedtime as needed (sleep).    . metoprolol tartrate (LOPRESSOR) 25 MG tablet Take 1 tablet (25 mg total) by mouth daily. 90 tablet 3  . midodrine (PROAMATINE) 2.5 MG tablet Take 1 tablet (2.5 mg total) by mouth 3 (three) times daily with meals. 90 tablet 6  . nitroGLYCERIN (NITROSTAT) 0.4 MG SL tablet DISSOLVE ONE TABLET UNDER THE TONGUE EVERY 5 MINUTES AS NEEDED FOR CHEST PAIN. DO NOT EXCEED A TOTAL OF 3 DOSES IN 15 MINUTES  6  . metoprolol succinate (TOPROL-XL) 25 MG 24 hr tablet Take 1 tablet (25 mg total) by mouth daily. (Patient not taking: Reported on 07/30/2017) 90 tablet 3  . metroNIDAZOLE (FLAGYL) 500 MG tablet TAKE 1 TABLET BY MOUTH THREE TIMES DAILY (Patient not taking: Reported on 07/30/2017) 30 tablet 1   No current facility-administered medications on file prior to visit.     Allergies  Allergen Reactions  . Brilinta [Ticagrelor] Shortness Of Breath  . Shellfish Allergy Other (See Comments)    Causes gout flare-ups    Family History  Problem Relation Age of Onset  . Heart disease Mother   . Hypertension Mother   . Diabetes Mother   . Heart attack Mother   . Heart disease Father   . Hypertension Father   . Diabetes Father   . Heart attack Father   . Stroke Paternal Aunt   . Stroke Paternal Uncle   . Colon cancer Neg Hx   . Esophageal cancer Neg Hx     Social History   Socioeconomic History  . Marital status: Married    Spouse name: Not on file  . Number of children: 3  . Years of education: Not on file  . Highest education level: Not on file  Occupational History  . Not on file  Social Needs  . Financial resource strain: Not on file  . Food insecurity:    Worry: Not on file    Inability: Not on file  . Transportation needs:    Medical: Not on file    Non-medical: Not on file  Tobacco Use  . Smoking status: Never Smoker  . Smokeless tobacco: Never Used  Substance and Sexual Activity  . Alcohol use: No  . Drug use: No  . Sexual  activity: Never  Lifestyle  . Physical activity:    Days per week: Not on file    Minutes per session: Not on file  . Stress: Not on file  Relationships  . Social connections:    Talks on  phone: Not on file    Gets together: Not on file    Attends religious service: Not on file    Active member of club or organization: Not on file    Attends meetings of clubs or organizations: Not on file    Relationship status: Not on file  Other Topics Concern  . Not on file  Social History Narrative   Long-term patient of Dr. Rollene Fare.   Married father of 71, grandfather 22.   Never smoked.   Not exercising D2 hip and back pain & now Angina   Review of Systems - See HPI.  All other ROS are negative.  BP 100/60   Pulse 79   Temp 98.1 F (36.7 C) (Oral)   Resp 15   Ht 5\' 5"  (1.651 m)   Wt 144 lb 6.4 oz (65.5 kg)   SpO2 95%   BMI 24.03 kg/m   Physical Exam  Constitutional: He is oriented to person, place, and time. He appears well-developed and well-nourished.  HENT:  Head: Normocephalic and atraumatic.  Right Ear: Tympanic membrane normal.  Left Ear: Tympanic membrane normal.  Mouth/Throat: No uvula swelling. Posterior oropharyngeal edema and posterior oropharyngeal erythema present. No oropharyngeal exudate or tonsillar abscesses. Tonsils are 0 on the right. Tonsils are 0 on the left. No tonsillar exudate.  White pseudomembranous patches of the tongue noted.  Neurological: He is alert and oriented to person, place, and time.  Psychiatric: He has a normal mood and affect.  Vitals reviewed.   Assessment/Plan: 1. Sore throat Erythematous oropharynx without lesion. Notes white pseudomembranous regions on tongue, concerning for thrust. Rapid strep negative. Will send for culture just to make sure there is not two issues contributing to symptoms. Start nystatin for thrush -- swish and swallow. Supportive measures and OTC medications reviewed.   - POCT rapid strep A - Culture, Group A  Strep   Leeanne Rio, PA-C

## 2017-07-30 NOTE — Patient Instructions (Addendum)
Your rapid strep test was negative.  We are sending for further assessment.  Giving the inflammation in the throat along with white patches on tongue, seems most consistent with thrush (yeast infection). Use the Nystatin mouthwash as directed. This should help calm things down. Tylenol for throat pain. Keep well-hydrated.  Follow-up with me on Friday for reassessment.

## 2017-08-01 LAB — CULTURE, GROUP A STREP
MICRO NUMBER: 90871910
SPECIMEN QUALITY:: ADEQUATE

## 2017-08-02 ENCOUNTER — Encounter: Payer: Self-pay | Admitting: Physician Assistant

## 2017-08-02 ENCOUNTER — Other Ambulatory Visit: Payer: Self-pay

## 2017-08-02 ENCOUNTER — Ambulatory Visit (INDEPENDENT_AMBULATORY_CARE_PROVIDER_SITE_OTHER): Payer: Medicare Other | Admitting: Physician Assistant

## 2017-08-02 VITALS — BP 116/62 | HR 58 | Temp 98.1°F | Resp 16 | Ht 65.0 in | Wt 147.6 lb

## 2017-08-02 DIAGNOSIS — Z9861 Coronary angioplasty status: Secondary | ICD-10-CM | POA: Diagnosis not present

## 2017-08-02 DIAGNOSIS — B370 Candidal stomatitis: Secondary | ICD-10-CM | POA: Diagnosis not present

## 2017-08-02 DIAGNOSIS — I2510 Atherosclerotic heart disease of native coronary artery without angina pectoris: Secondary | ICD-10-CM

## 2017-08-02 NOTE — Patient Instructions (Signed)
Your mouth/throat look so much better today. Continue the mouthwash through the weekend. Symptoms should continue to resolve. If not or you note any new or worsening symptoms,please give me a call.

## 2017-08-02 NOTE — Progress Notes (Signed)
Patient presents to clinic today for follow-up of sore throat 2/2 oral thrush. Throat culture negative at last visit. Patient is using his Nystatin mouthwash as directed. Notes tolerating well.    Past Medical History:  Diagnosis Date  . Anemia    Likely secondary to history of GI bleed  . Anginal pain (HCC)    Chronic stable  . Anxiety   . Arthritis   . BPH (benign prostatic hyperplasia)   . CAD (coronary artery disease) of bypass graft 2006, 2012   In 2006: Occluded SVG-OM noted (SVG-diagonal was previously occluded); 2012: Severe lesion in redo SVG-OM1 --> BMS PCI   . CAD in native artery; and the grafts 1992, 1997, 2002, 2006, 2012   CABG 1992 and redo CABG 2006  . CAD S/P percutaneous coronary angioplasty October 2012; Feb, Apr & Oct 2015; Feb 2016   a) s/p CABG 1992 and redo 2006. b) 10/12: NSTEMI: PCI to SVG-OM1 Integrity BMS 3.5 x 15 (3.85 mm) c) 2/15: UA - PCI mid RCA Xience Ap DES 2.75 x 15 (3.1 mm). d) 4/15 : UA - focal dRCA Resolute DES 2.25 x 8 (2.5 mm). e) 10/15: PCI to mRCA ISR w/ post stent lesion - Promus P 2.75 x 16 (3.25 mm). f) 2/16: NSTEMI: CBA/PTCA of  mRCA ISR (3.5 mm post-dilation); g) 7/16 ISR RCA->CBA/PTCA, residual 20%.  . Carotid arterial disease ([place])    a) Duplex 05/2012: R BULB/PROX ICA: 50-69%, L BULB/PROX ICA: 0-49%. ;; 04/29/2014: 00-93% RICA, 81-82% LICA. Patetn vertebrals,  . Chronic low back pain   . Colon cancer [facility]) 2003   colectomy for CA. no recurrence . never required  radiation or chem  . Diabetes mellitus without complication ([place])   . Dyspnea    "when I get too much Fluid"  . End stage renal disease on dialysis [facility])    Dialyzes at [facility]: Tues/Th/Sat - left upper cavity AV fistula brachiocephalic  . Gout   . Heart murmur   . History of hiatal hernia   . History of Non-ST elevated myocardial infarction (non-STEMI) 1992, 2006, 2012; 02/2013  . Hyperlipidemia   . Hypertension   . Hypothyroidism    On supplementation  . Ischemic  cardiomyopathy    a) EF 45-50% by echo 2015. b) EF 50% by cath 04/2014.  Marland Kitchen LBBB (left bundle branch block)    Chronic  . Peptic ulcer disease  2004  . Plavix resistance    a) P2Y12 264 in 02/2014 - put on Brilinta but did not tolerate due to SOB. b) cannot be on Effient due to history of stroke. c) Plavix increased to 75mg  BID and Pletal added 04/2014 due to ISR.  Marland Kitchen Pneumonia   . Skin cancer 02/1999   nose  . Stroke [facility]) 1998    Current Outpatient Medications on File Prior to Visit  Medication Sig Dispense Refill  . acetaminophen (TYLENOL) 500 MG tablet Take 1,000 mg by mouth 2 (two) times daily as needed for mild pain.     Marland Kitchen atorvastatin (LIPITOR) 40 MG tablet Take 1 tablet (40 mg total) by mouth daily. 90 tablet 3  . Besifloxacin HCl (BESIVANCE) 0.6 % SUSP Place 1 drop into both eyes See admin instructions. Use eye drops 2 times daily for 2 days following injection by Dr. Zigmund Daniel (every 10 weeks)    . cinacalcet (SENSIPAR) 30 MG tablet Take 30 mg by mouth daily.    . clonazePAM (KLONOPIN) 0.5 MG tablet TAKE 1 TABLET BY  MOUTH TWICE DAILY AS NEEDED FOR ANXIETY 30 tablet 3  . clopidogrel (PLAVIX) 75 MG tablet Take 1 tablet (75 mg total) by mouth daily. 90 tablet 3  . ferric citrate (AURYXIA) 1 GM 210 MG(Fe) tablet Take 420 mg by mouth 3 (three) times daily with meals.    Marland Kitchen glucose blood (TRUE METRIX BLOOD GLUCOSE TEST) test strip 1 each by Other route as needed for other. Use as instructed to test sugars 2-3 times daily. Dx. E11.40 100 each 12  . isosorbide mononitrate (IMDUR) 120 MG 24 hr tablet TAKE 1 TABLET BY MOUTH ONCE DAILY 90 tablet 2  . levothyroxine (SYNTHROID, LEVOTHROID) 125 MCG tablet TAKE 1 TABLET BY MOUTH ONCE DAILY BEFORE BREAKFAST 90 tablet 1  . lidocaine-prilocaine (EMLA) cream Apply 1 application topically See admin instructions. Apply prior to dialysis treatments on Tuesday, Thursday and Saturday    . MELATONIN PO Take 1 tablet by mouth at bedtime as needed (sleep).    .  metoprolol succinate (TOPROL-XL) 25 MG 24 hr tablet Take 1 tablet (25 mg total) by mouth daily. 90 tablet 3  . midodrine (PROAMATINE) 2.5 MG tablet Take 1 tablet (2.5 mg total) by mouth 3 (three) times daily with meals. 90 tablet 6  . nitroGLYCERIN (NITROSTAT) 0.4 MG SL tablet DISSOLVE ONE TABLET UNDER THE TONGUE EVERY 5 MINUTES AS NEEDED FOR CHEST PAIN. DO NOT EXCEED A TOTAL OF 3 DOSES IN 15 MINUTES  6  . nystatin (MYCOSTATIN) 100000 UNIT/ML suspension Take 5 mLs (500,000 Units total) by mouth 3 (three) times daily. 60 mL 0  . metroNIDAZOLE (FLAGYL) 500 MG tablet TAKE 1 TABLET BY MOUTH THREE TIMES DAILY (Patient not taking: Reported on 07/30/2017) 30 tablet 1   No current facility-administered medications on file prior to visit.     Allergies  Allergen Reactions  . Brilinta [Ticagrelor] Shortness Of Breath  . Shellfish Allergy Other (See Comments)    Causes gout flare-ups    Family History  Problem Relation Age of Onset  . Heart disease Mother   . Hypertension Mother   . Diabetes Mother   . Heart attack Mother   . Heart disease Father   . Hypertension Father   . Diabetes Father   . Heart attack Father   . Stroke Paternal Aunt   . Stroke Paternal Uncle   . Colon cancer Neg Hx   . Esophageal cancer Neg Hx     Social History   Socioeconomic History  . Marital status: Married    Spouse name: Not on file  . Number of children: 3  . Years of education: Not on file  . Highest education level: Not on file  Occupational History  . Not on file  Social Needs  . Financial resource strain: Not on file  . Food insecurity:    Worry: Not on file    Inability: Not on file  . Transportation needs:    Medical: Not on file    Non-medical: Not on file  Tobacco Use  . Smoking status: Never Smoker  . Smokeless tobacco: Never Used  Substance and Sexual Activity  . Alcohol use: No  . Drug use: No  . Sexual activity: Never  Lifestyle  . Physical activity:    Days per week: Not on  file    Minutes per session: Not on file  . Stress: Not on file  Relationships  . Social connections:    Talks on phone: Not on file    Gets together: Not  on file    Attends religious service: Not on file    Active member of club or organization: Not on file    Attends meetings of clubs or organizations: Not on file    Relationship status: Not on file  Other Topics Concern  . Not on file  Social History Narrative   Long-term patient of Dr. Rollene Fare.   Married father of 73, grandfather 40.   Never smoked.   Not exercising D2 hip and back pain & now Angina   Review of Systems - See HPI.  All other ROS are negative.  BP 116/62   Pulse (!) 58   Temp 98.1 F (36.7 C) (Oral)   Resp 16   Ht 5\' 5"  (1.651 m)   Wt 147 lb 9.6 oz (67 kg)   SpO2 97%   BMI 24.56 kg/m   Physical Exam  Constitutional: He appears well-developed and well-nourished.  HENT:  Head: Normocephalic and atraumatic.  Nose: Nose normal.  Mouth/Throat: Oropharynx is clear and moist.  Eyes: Conjunctivae are normal.  Cardiovascular: Normal rate, regular rhythm, normal heart sounds and intact distal pulses.  Pulmonary/Chest: Effort normal.  Lymphadenopathy:    He has no cervical adenopathy.  Psychiatric: He has a normal mood and affect.  Vitals reviewed.  Recent Results (from the past 2160 hour(s))  POCT rapid strep A     Status: Normal   Collection Time: 07/30/17  3:06 PM  Result Value Ref Range   Rapid Strep A Screen Negative Negative  Culture, Group A Strep     Status: None   Collection Time: 07/30/17  3:18 PM  Result Value Ref Range   MICRO NUMBER: [id number]    SPECIMEN QUALITY: ADEQUATE    SOURCE: THROAT    STATUS: FINAL    RESULT: No group A Streptococcus isolated     Assessment/Plan: 1. Oral thrush Resolving. No new or worsening symptoms. Continue Nystatin mouthwash as directed. Follow-up if not resolving completely.    Leeanne Rio, PA-C

## 2017-08-19 ENCOUNTER — Other Ambulatory Visit: Payer: Self-pay | Admitting: Physician Assistant

## 2017-09-10 ENCOUNTER — Telehealth: Payer: Self-pay | Admitting: Family Medicine

## 2017-09-10 NOTE — Telephone Encounter (Signed)
Please clarify and update CRM.

## 2017-09-10 NOTE — Telephone Encounter (Unsigned)
Copied from [place] [phone]. Topic: Quick Communication - See Telephone Encounter >> Sep 10, 2017 12:41 PM Neva Seat wrote: CRM for notification. See Telephone encounter for: 09/10/17.

## 2017-09-11 ENCOUNTER — Ambulatory Visit: Payer: Medicare Other | Admitting: Cardiology

## 2017-09-11 ENCOUNTER — Telehealth: Payer: Self-pay | Admitting: Emergency Medicine

## 2017-09-11 NOTE — Telephone Encounter (Signed)
Copied from [place] [phone]. Topic: General - Other >> Sep 11, 2017 12:19 PM Yvette Rack wrote: Reason for CRM: Pt called in requesting to speak with Janett Billow. Pt requests a return call. Cb# [phone]

## 2017-09-11 NOTE — Telephone Encounter (Signed)
Called and spoke with pt. He advised that he is having the same irritated throat feeling since July. Pt is in no discomfort and could speak clearly. He was scheduled for tomorrow at 11:30 am with dr. Birdie Riddle.

## 2017-09-12 ENCOUNTER — Other Ambulatory Visit: Payer: Self-pay

## 2017-09-12 ENCOUNTER — Ambulatory Visit (INDEPENDENT_AMBULATORY_CARE_PROVIDER_SITE_OTHER): Payer: Medicare Other | Admitting: Family Medicine

## 2017-09-12 ENCOUNTER — Encounter: Payer: Self-pay | Admitting: Family Medicine

## 2017-09-12 VITALS — BP 120/60 | HR 68 | Temp 98.0°F | Resp 16 | Ht 65.0 in | Wt 144.4 lb

## 2017-09-12 DIAGNOSIS — Z9861 Coronary angioplasty status: Secondary | ICD-10-CM | POA: Diagnosis not present

## 2017-09-12 DIAGNOSIS — K146 Glossodynia: Secondary | ICD-10-CM | POA: Diagnosis not present

## 2017-09-12 DIAGNOSIS — I2510 Atherosclerotic heart disease of native coronary artery without angina pectoris: Secondary | ICD-10-CM

## 2017-09-12 MED ORDER — NORTRIPTYLINE HCL 10 MG PO CAPS: 10.0000 mg | ORAL_CAPSULE | Freq: Every day | ORAL | 3 refills | Status: DC

## 2017-09-12 NOTE — Progress Notes (Signed)
   Subjective:    Patient ID: ARIES TOWNLEY, male    DOB: January 09, 1940, 77 y.o.   MRN: [id number]  HPI Burning mouth- 'it feels like I have hot spicy food in it all the time'.  sxs started in July.  No visible sores.  Tongue, lips, and oral mucosa feels like it's burning.  sxs are not as bad as they were in July- treated w/ Nystatin.  'it just hasn't cleared up'.  Doesn't feel swollen.  Taking Clonazepam once nightly.   Review of Systems For ROS see HPI     Objective:   Physical Exam  Constitutional: He is oriented to person, place, and time. He appears well-developed and well-nourished. No distress.  HENT:  Head: Normocephalic and atraumatic.  Mouth/Throat: Oropharynx is clear and moist.  No evidence of oral lesions  Neck: Neck supple.  Lymphadenopathy:    He has no cervical adenopathy.  Neurological: He is alert and oriented to person, place, and time.  Skin: Skin is warm and dry.  Psychiatric: He has a normal mood and affect. His behavior is normal. Thought content normal.  Vitals reviewed.         Assessment & Plan:  Burning mouth syndrome- pt's sxs only mildly improved w/ tx of thrush.  He continues to feel as if his whole mouth is burning.  Based on this, and absence of any physical findings, will start low dose nortriptyline and follow.  If no improvement, will refer to ENT.  Pt expressed understanding and is in agreement w/ plan.

## 2017-09-12 NOTE — Patient Instructions (Signed)
Follow up by phone or MyChart in 3-4 weeks to let me know how the burning mouth is doing START the Nortriptyline nightly to help ease your symptoms We will likely need to increase the dose but need to start slowly If no improvement, we can refer to ENT for complete evaluation Call with any questions or concerns Happy Fall!!!

## 2017-10-18 ENCOUNTER — Encounter: Payer: Self-pay | Admitting: Podiatry

## 2017-10-18 ENCOUNTER — Ambulatory Visit (INDEPENDENT_AMBULATORY_CARE_PROVIDER_SITE_OTHER): Payer: Medicare Other | Admitting: Podiatry

## 2017-10-18 DIAGNOSIS — M79675 Pain in left toe(s): Secondary | ICD-10-CM

## 2017-10-18 DIAGNOSIS — E1142 Type 2 diabetes mellitus with diabetic polyneuropathy: Secondary | ICD-10-CM | POA: Diagnosis not present

## 2017-10-18 DIAGNOSIS — M79674 Pain in right toe(s): Secondary | ICD-10-CM

## 2017-10-18 DIAGNOSIS — B351 Tinea unguium: Secondary | ICD-10-CM

## 2017-10-18 NOTE — Progress Notes (Signed)
Subjective: [name] for preventative foot care. He has diabetic neuropathy and  painful, discolored, thick toenails which interfere with daily activities and routine tasks. Pain is aggravated when wearing enclosed shoe gear. Pain is relieved with periodic professional debridement.  Mr. Doris states his blood sugar was 107 mg/dL this morning.  He is on hemodialysis on TTS.  He relates he, his wife and dogs will be traveling to [facility] on today and he has made arrangements for his dialysis treatments there will on his trip.  He voices no new pedal concerns on today's visit.  Objective: Vascular Examination: Capillary refill time <3 seconds x 10 digits Dorsalis pedis and Posterior tibial pulses present b/l No digital hair x 10 digits Skin temperature gradient WNL b/l  Dermatological Examination: Skin with normal turgor, texture and tone b/l Toenails 1-5 b/l discolored, thick, dystrophic with subungual debris and pain with palpation to nailbeds due to thickness of nails.  Musculoskeletal: Muscle strength 5/5 to all LE muscle groups HAV b/l  Neurological: Sensation diminished with 10 gram monofilament. Vibratory sensation diminished  Assessment: 1. Painful onychomycosis toenails 1-5 b/l 2. NIDDM with Diabetic neuropathy  Plan: 1. Continue diabetic foot care principles.  2. Toenails 1-5 b/l were debrided in length and girth without iatrogenic bleeding. 3. Patient to continue soft, supportive shoe gear 4. Patient to report any pedal injuries to medical professional  5. Follow up 3 months. Patient/POA to call should there be a concern in the interim.

## 2017-10-21 ENCOUNTER — Encounter: Payer: Self-pay | Admitting: Family Medicine

## 2017-10-21 ENCOUNTER — Other Ambulatory Visit: Payer: Self-pay

## 2017-10-21 ENCOUNTER — Ambulatory Visit (INDEPENDENT_AMBULATORY_CARE_PROVIDER_SITE_OTHER): Payer: Medicare Other | Admitting: Family Medicine

## 2017-10-21 VITALS — BP 104/60 | HR 56 | Temp 97.8°F | Resp 14 | Ht 65.0 in | Wt 147.0 lb

## 2017-10-21 DIAGNOSIS — K146 Glossodynia: Secondary | ICD-10-CM | POA: Diagnosis not present

## 2017-10-21 DIAGNOSIS — I2510 Atherosclerotic heart disease of native coronary artery without angina pectoris: Secondary | ICD-10-CM | POA: Diagnosis not present

## 2017-10-21 DIAGNOSIS — Z9861 Coronary angioplasty status: Secondary | ICD-10-CM

## 2017-10-21 DIAGNOSIS — E0840 Diabetes mellitus due to underlying condition with diabetic neuropathy, unspecified: Secondary | ICD-10-CM | POA: Diagnosis not present

## 2017-10-21 DIAGNOSIS — I10 Essential (primary) hypertension: Secondary | ICD-10-CM | POA: Diagnosis not present

## 2017-10-21 DIAGNOSIS — E785 Hyperlipidemia, unspecified: Secondary | ICD-10-CM | POA: Diagnosis not present

## 2017-10-21 LAB — CBC WITH DIFFERENTIAL/PLATELET
BASOS ABS: 0.1 10*3/uL (ref 0.0–0.1)
Basophils Relative: 0.7 % (ref 0.0–3.0)
Eosinophils Absolute: 0.3 10*3/uL (ref 0.0–0.7)
Eosinophils Relative: 3.2 % (ref 0.0–5.0)
HCT: 35.8 % — ABNORMAL LOW (ref 39.0–52.0)
Hemoglobin: 12 g/dL — ABNORMAL LOW (ref 13.0–17.0)
LYMPHS ABS: 0.8 10*3/uL (ref 0.7–4.0)
Lymphocytes Relative: 9.2 % — ABNORMAL LOW (ref 12.0–46.0)
MCHC: 33.6 g/dL (ref 30.0–36.0)
MCV: 98.3 fl (ref 78.0–100.0)
MONOS PCT: 8.4 % (ref 3.0–12.0)
Monocytes Absolute: 0.8 10*3/uL (ref 0.1–1.0)
NEUTROS PCT: 78.5 % — AB (ref 43.0–77.0)
Neutro Abs: 7.1 10*3/uL (ref 1.4–7.7)
Platelets: 310 10*3/uL (ref 150.0–400.0)
RBC: 3.64 Mil/uL — AB (ref 4.22–5.81)
RDW: 14.4 % (ref 11.5–15.5)
WBC: 9 10*3/uL (ref 4.0–10.5)

## 2017-10-21 LAB — BASIC METABOLIC PANEL
BUN: 64 mg/dL — ABNORMAL HIGH (ref 6–23)
CALCIUM: 9 mg/dL (ref 8.4–10.5)
CO2: 32 mEq/L (ref 19–32)
Chloride: 91 mEq/L — ABNORMAL LOW (ref 96–112)
Creatinine, Ser: 6.79 mg/dL (ref 0.40–1.50)
GFR: 8.46 mL/min — AB (ref >60.00)
GLUCOSE: 120 mg/dL — AB (ref 70–99)
Potassium: 5.2 mEq/L — ABNORMAL HIGH (ref 3.5–5.1)
Sodium: 136 mEq/L (ref 135–145)

## 2017-10-21 LAB — HEPATIC FUNCTION PANEL
ALK PHOS: 108 U/L (ref 39–117)
ALT: 17 U/L (ref 0–53)
AST: 12 U/L (ref 0–37)
Albumin: 4.4 g/dL (ref 3.5–5.2)
Bilirubin, Direct: 0.1 mg/dL (ref 0.0–0.3)
TOTAL PROTEIN: 7.1 g/dL (ref 6.0–8.3)
Total Bilirubin: 0.4 mg/dL (ref 0.2–1.2)

## 2017-10-21 LAB — LIPID PANEL
CHOLESTEROL: 119 mg/dL (ref 0–200)
HDL: 25.2 mg/dL — ABNORMAL LOW (ref >39.00)
LDL CALC: 65 mg/dL (ref 0–99)
NonHDL: 93.84
TRIGLYCERIDES: 146 mg/dL (ref 0.0–149.0)
Total CHOL/HDL Ratio: 5
VLDL: 29.2 mg/dL (ref 0.0–40.0)

## 2017-10-21 LAB — HEMOGLOBIN A1C: Hgb A1c MFr Bld: 5.5 % (ref 4.6–6.5)

## 2017-10-21 LAB — TSH: TSH: 1.95 u[IU]/mL (ref 0.35–4.50)

## 2017-10-21 MED ORDER — NORTRIPTYLINE HCL 25 MG PO CAPS: 25.0000 mg | ORAL_CAPSULE | Freq: Every day | ORAL | 3 refills | Status: AC

## 2017-10-21 NOTE — Progress Notes (Signed)
   Subjective:    Patient ID: [name] [name], male    DOB: 1940/01/12, 77 y.o.   MRN: [id number]  HPI DM- chronic problem, but not currently on medication.  UTD on eye exam, foot exam (done 10/11 w/ podiatry).  No need for microalbumin due to HD.  Hyperlipidemia- chronic problem, on Lipitor 40mg  daily.  No abd pain, N/V.  HTN- chronic problem, on Metoprolol 25mg  w/ good control.  Continues to have CP but cardiology aware.  Denies SOB, HAs, visual changes.  Burning mouth- some improvement on Nortriptyline but continues to have sxs.  Review of Systems For ROS see HPI     Objective:   Physical Exam  Constitutional: He is oriented to person, place, and time. He appears well-developed and well-nourished. No distress.  HENT:  Head: Normocephalic and atraumatic.  Eyes: Pupils are equal, round, and reactive to light. Conjunctivae and EOM are normal.  Neck: Normal range of motion. Neck supple. No thyromegaly present.  Cardiovascular: Normal rate, regular rhythm, normal heart sounds and intact distal pulses.  No murmur heard. Pulmonary/Chest: Effort normal and breath sounds normal. No respiratory distress.  Abdominal: Soft. Bowel sounds are normal. He exhibits no distension.  Musculoskeletal: He exhibits no edema.  Lymphadenopathy:    He has no cervical adenopathy.  Neurological: He is alert and oriented to person, place, and time. No cranial nerve deficit.  Skin: Skin is warm and dry.  Psychiatric: He has a normal mood and affect. His behavior is normal.  Vitals reviewed.         Assessment & Plan:

## 2017-10-21 NOTE — Patient Instructions (Signed)
Follow up in 6 months to recheck diabetes, BP, and cholesterol We'll notify you of your lab results and make any changes if needed Keep up the good work!  You look great! We'll increase the Nortriptyline from 10mg  --> 25mg .  New prescription sent Call with any questions or concerns Happy Fall!!

## 2017-10-22 DIAGNOSIS — K146 Glossodynia: Secondary | ICD-10-CM | POA: Insufficient documentation

## 2017-10-22 NOTE — Assessment & Plan Note (Signed)
Chronic problem.  Tends to run on the low side- particularly after HD.  Remains on beta blocker due to severe CAD.  Will continue to follow.

## 2017-10-22 NOTE — Assessment & Plan Note (Signed)
Chronic problem.  Hx of excellent control.  Not on medication at this time.  UTD on foot exam, eye exam.  No need for microalbumin due to HD.  Check labs and adjust plan prn.

## 2017-10-22 NOTE — Assessment & Plan Note (Signed)
Improved w/ addition of Nortriptyline but he continues to have sxs.  Will titrate medication in hopes of better sx relief.  Pt expressed understanding and is in agreement w/ plan.

## 2017-10-22 NOTE — Assessment & Plan Note (Signed)
Chronic problem.  Tolerating statin w/o difficulty.  Check labs.  Adjust meds prn  

## 2017-10-25 ENCOUNTER — Encounter (INDEPENDENT_AMBULATORY_CARE_PROVIDER_SITE_OTHER): Payer: Medicare Other | Admitting: Ophthalmology

## 2017-10-25 DIAGNOSIS — E113312 Type 2 diabetes mellitus with moderate nonproliferative diabetic retinopathy with macular edema, left eye: Secondary | ICD-10-CM

## 2017-10-25 DIAGNOSIS — H43813 Vitreous degeneration, bilateral: Secondary | ICD-10-CM

## 2017-10-25 DIAGNOSIS — E113391 Type 2 diabetes mellitus with moderate nonproliferative diabetic retinopathy without macular edema, right eye: Secondary | ICD-10-CM

## 2017-10-25 DIAGNOSIS — I10 Essential (primary) hypertension: Secondary | ICD-10-CM

## 2017-10-25 DIAGNOSIS — E11311 Type 2 diabetes mellitus with unspecified diabetic retinopathy with macular edema: Secondary | ICD-10-CM

## 2017-10-25 DIAGNOSIS — H35033 Hypertensive retinopathy, bilateral: Secondary | ICD-10-CM

## 2017-10-27 ENCOUNTER — Other Ambulatory Visit: Payer: Self-pay | Admitting: Family Medicine

## 2017-10-28 NOTE — Telephone Encounter (Signed)
Last OV 10/21/17 Clonazepam last filled 06/13/17 #30 with 3

## 2017-10-30 ENCOUNTER — Encounter: Payer: Self-pay | Admitting: Family Medicine

## 2017-10-30 ENCOUNTER — Encounter: Payer: Self-pay | Admitting: Cardiology

## 2017-10-30 ENCOUNTER — Ambulatory Visit (INDEPENDENT_AMBULATORY_CARE_PROVIDER_SITE_OTHER): Payer: Medicare Other | Admitting: Cardiology

## 2017-10-30 VITALS — BP 102/52 | HR 63 | Ht 65.0 in | Wt 146.2 lb

## 2017-10-30 DIAGNOSIS — Z9861 Coronary angioplasty status: Secondary | ICD-10-CM

## 2017-10-30 DIAGNOSIS — I5032 Chronic diastolic (congestive) heart failure: Secondary | ICD-10-CM

## 2017-10-30 DIAGNOSIS — I25119 Atherosclerotic heart disease of native coronary artery with unspecified angina pectoris: Secondary | ICD-10-CM

## 2017-10-30 DIAGNOSIS — E785 Hyperlipidemia, unspecified: Secondary | ICD-10-CM | POA: Diagnosis not present

## 2017-10-30 DIAGNOSIS — I208 Other forms of angina pectoris: Secondary | ICD-10-CM

## 2017-10-30 DIAGNOSIS — T82855D Stenosis of coronary artery stent, subsequent encounter: Secondary | ICD-10-CM

## 2017-10-30 DIAGNOSIS — I2510 Atherosclerotic heart disease of native coronary artery without angina pectoris: Secondary | ICD-10-CM | POA: Diagnosis not present

## 2017-10-30 MED ORDER — NITROGLYCERIN 0.4 MG SL SUBL: SUBLINGUAL_TABLET | SUBLINGUAL | 11 refills | Status: AC

## 2017-10-30 NOTE — Patient Instructions (Signed)
Medication Instructions:  NO MEDICATION CHANGES If you need a refill on your cardiac medications before your next appointment, please call your pharmacy.   Lab work:NOT NEEEDED If you have labs (blood work) drawn today and your tests are completely normal, you will receive your results only by: Marland Kitchen MyChart Message (if you have MyChart) OR . A paper copy in the mail If you have any lab test that is abnormal or we need to change your treatment, we will call you to review the results.  Testing/Procedures: NOT  NEEDED  Follow-Up: At [facility], you and your health needs are our priority.  As part of our continuing mission to provide you with exceptional heart care, we have created designated Provider Care Teams.  These Care Teams include your primary Cardiologist (physician) and Advanced Practice Providers (APPs -  Physician Assistants and Nurse Practitioners) who all work together to provide you with the care you need, when you need it. . Your physician recommends that you schedule a follow-up appointment in Jan 2020 with DR [facility]. .   Any Other Special Instructions Will Be Listed Below (If Applicable).

## 2017-10-30 NOTE — Progress Notes (Signed)
PCP: Midge Minium, MD  Clinic Note: Chief Complaint  Patient presents with  . Follow-up    No acute issues  . Coronary Artery Disease    Slight progression of angina    HPI: [name] [name] is a 77 y.o. male with a PMH below who presents today for 4 month follow-up for his known CAD with chronic angina and chronic in-stent restenosis issues on the RCA.Marland Kitchen -- Most recent event: 05/18/2016 non-STEMI --> cutting balloon PTCA of mRCA ISR  Unfortunately, in order to allow his blood pressure to be higher, we have been forced to back down his antianginal medicines --> coincided with him having to take more nitroglycerin.  He noted that the midodrine really did not help him much.  [name] [name] was last seen on July 24, 2017 --> relatively stable.  No major change but had been using more nitroglycerin at that time than he had been before.  Not yet significant.  Oftentimes noted into decubitus.  Started taking metoprolol on the evenings after dialysis only.  Was trying to get out and do more outdoor activities including gardening.  Overall better energy  Recent Hospitalizations:  n/a  Studies Personally Reviewed - (if available, images/films reviewed: From Epic Chart or Care Everywhere)  No new studies  Interval History: [name] [name] returns today noting that he is a little bit worse than he was before.  His energy level has improved and his blood pressures have improved because a have started taking less fluid off during his dialysis days and therefore he feels better on his post dialysis days.  He just does not feel well at all at the takeoff down to his full dry weight. As far as his angina, he has some really good days where he does not use any nitroglycerin all, but will then have some days where he will use it 3-4 times a day.  He quite often has chest pain that can occur at rest but for the most part it is with him doing any type of exertion.  He is never had to take more than one  nitroglycerin for an episode of chest pain since I last saw him, and he therefore does not feel like he is at the point where he needs to go back into the Cath Lab.  His wife however is concerned. He is not really noticing the angina decubitus as much as he had. Still not really able to do much outdoor activity.  Pretty much limited to sitting around most the day and not doing any real activity.  No further edema.  No PND or orthopnea.  Minimal/rare palpitations.  No syncope/near syncope or TIA/amaurosis fugax.  No bleeding issues of melena, hematochezia, epistaxis -but he still has bruising  ROS: A comprehensive was performed. Pertinent symptoms noted in HPI> Review of Systems  Constitutional: Positive for malaise/fatigue. Negative for chills, fever and weight loss.  HENT: Negative for congestion and nosebleeds.   Respiratory: Negative for cough and wheezing.   Cardiovascular:       Per history of present illness  Gastrointestinal: Negative for abdominal pain, blood in stool, heartburn and melena.  Musculoskeletal: Positive for joint pain. Negative for falls and myalgias.  Neurological: Positive for dizziness (Per HPI ), weakness (Generalized) and headaches. Negative for focal weakness.  Endo/Heme/Allergies: Bruises/bleeds easily.  Psychiatric/Behavioral: Positive for depression and memory loss. The patient is not nervous/anxious and does not have insomnia.   All other systems reviewed and are negative.  I have reviewed and (if needed) personally updated the patient's problem list, medications, allergies, past medical and surgical history, social and family history.   Past Medical History:  Diagnosis Date  . Anemia    Likely secondary to history of GI bleed  . Anginal pain (HCC)    Chronic stable  . Anxiety   . Arthritis   . BPH (benign prostatic hyperplasia)   . CAD (coronary artery disease) of bypass graft 2006, 2012   In 2006: Occluded SVG-OM noted (SVG-diagonal was previously  occluded); 2012: Severe lesion in redo SVG-OM1 --> BMS PCI   . CAD in native artery; and the grafts 1992, 1997, 2002, 2006, 2012   CABG 1992 and redo CABG 2006  . CAD S/P percutaneous coronary angioplasty October 2012; Feb, Apr & Oct 2015; Feb 2016   a) s/p CABG 1992 and redo 2006. b) 10/12: NSTEMI: PCI to SVG-OM1 Integrity BMS 3.5 x 15 (3.85 mm) c) 2/15: UA - PCI mid RCA Xience Ap DES 2.75 x 15 (3.1 mm). d) 4/15 : UA - focal dRCA Resolute DES 2.25 x 8 (2.5 mm). e) 10/15: PCI to mRCA ISR w/ post stent lesion - Promus P 2.75 x 16 (3.25 mm). f) 2/16: NSTEMI: CBA/PTCA of  mRCA ISR (3.5 mm post-dilation); g) 7/16 ISR RCA->CBA/PTCA, residual 20%.  . Carotid arterial disease ([place])    a) Duplex 05/2012: R BULB/PROX ICA: 50-69%, L BULB/PROX ICA: 0-49%. ;; 04/29/2014: 22-97% RICA, 98-92% LICA. Patetn vertebrals,  . Chronic low back pain   . Colon cancer [facility]) 2003   colectomy for CA. no recurrence . never required  radiation or chem  . Diabetes mellitus without complication ([place])   . Dyspnea    "when I get too much Fluid"  . End stage renal disease on dialysis [facility])    Dialyzes at [facility]: Tues/Th/Sat - left upper cavity AV fistula brachiocephalic  . Gout   . Heart murmur   . History of hiatal hernia   . History of Non-ST elevated myocardial infarction (non-STEMI) 1992, 2006, 2012; 02/2013  . Hyperlipidemia   . Hypertension   . Hypothyroidism    On supplementation  . Ischemic cardiomyopathy    a) EF 45-50% by echo 2015. b) EF 50% by cath 04/2014.  Marland Kitchen LBBB (left bundle branch block)    Chronic  . Peptic ulcer disease  2004  . Plavix resistance    a) P2Y12 264 in 02/2014 - put on Brilinta but did not tolerate due to SOB. b) cannot be on Effient due to history of stroke. c) Plavix increased to 75mg  BID and Pletal added 04/2014 due to ISR.  Marland Kitchen Pneumonia   . Skin cancer 02/1999   nose  . Stroke [facility]) 1998    Past Surgical History:  Procedure Laterality Date  . AORTIC ARCH ANGIOGRAPHY N/A  05/09/2016   Procedure: Aortic Arch Angiography;  Surgeon: Waynetta Sandy, MD;  Location: [place] CV LAB;  Service: Cardiovascular;  Laterality: N/A;  . APPENDECTOMY    . AV FISTULA REPAIR Left 05/2009  . CARDIAC CATHETERIZATION  10/16/2010   patent  LIMA to the LAD , PATENT  svg TO 2nd marginals ,occluded PLA with collaterals and SVG to the OM  had 90% stenosis  was txlge  bare - metal  3.5  Integrity ten  postdilate  36-37  mm    . CARDIAC CATHETERIZATION  03/22/2004   loss of both SVG,severe disease in prox and ostial  circ not amenable to  invention. mod diease distal LAD  AFTER BYPASS GRAFT;-CVTS to evaluate   . CARDIAC CATHETERIZATION N/A 08/03/2014   Procedure: Left Heart Cath and Cors/Grafts Angiography;  Surgeon: Leonie Man, MD;  Location: [place] CV LAB;  Service: Cardiovascular;  Laterality: N/A;  . CARDIAC CATHETERIZATION N/A 08/03/2014   Procedure: Coronary Balloon Angioplasty;  Surgeon: Leonie Man, MD;  Location: [place] CV LAB;  Service: Cardiovascular;: Aggressive Cutting Balloon - [place] Balloon PTCA of ISR site in mRCA (3 stent layer)  . CAROTID DOPPLER  05/23/2012   ABN CAROTID-- RGT BULB/PROX ICA mild to mod 50-60%;lft bulb/prox mild to mod 0-49%;left subclavian abn waveforms consistent with patients lft arm A/V fistula  . CATARACT EXTRACTION W/ INTRAOCULAR LENS  IMPLANT, BILATERAL Bilateral   . CHOLECYSTECTOMY  2009  . COLON RESECTION  2003   transverse and proximal descending w/ primar anastomosis  . COLON SURGERY  2003   "cancer"  . COLONOSCOPY    . CORONARY ANGIOPLASTY  04/03/1995   OM and Circ  . CORONARY ANGIOPLASTY  02/01/2000   CIRC  . CORONARY ANGIOPLASTY  02/09/14   Cutting Balloon PTCA of mRCA ISR 99% - 3.5 mm  . CORONARY ARTERY BYPASS GRAFT  08/22/1990   INITIAL:LIMA to LAD, SVG to  Diagonal, SVG to OM  . CORONARY ARTERY BYPASS GRAFT  2006   SVG to OM1,SVG to OM2 with patent LIMA  to LAD and occluded vein  graft to the diagonal  and  occluded vein grft to OM from  prior  surgery  . CORONARY ATHERECTOMY  05/21/2016   Successful PTCA and cutting balloon atherectomy of mid dominant RCA "in-stent restenosis of the lesion that had been intervened on close to 2 years ago with an excellent result.   . CORONARY BALLOON ANGIOPLASTY N/A 05/21/2016   Procedure: Coronary Balloon Angioplasty;  Surgeon: Lorretta Harp, MD;  Location: [place] CV LAB;  Service: Cardiovascular:: Redo Cutting Balloon angioplasty RCA ISR  . DISTAL REVASCULARIZATION AND INTERVAL LIGATION (DRIL) Left 05/14/2016   Procedure: DISTAL REVASCULARIZATION AND INTERVAL LIGATION (DRIL) LEFT ARM;  Surgeon: Waynetta Sandy, MD;  Location: [place];  Service: Vascular;  Laterality: Left;  . DOPPLER ECHOCARDIOGRAPHY  01/25/2012   EF 50 to 55%  . EVENT MONITOR  01/23/2012-02/06/2012   SINUS ,LBBB,unifocal PVCs  . LEFT AND RIGHT HEART CATHETERIZATION WITH CORONARY/GRAFT ANGIOGRAM N/A 04/20/2014   Procedure: LEFT AND RIGHT HEART CATHETERIZATION WITH Beatrix Fetters;  Surgeon: Sherren Mocha, MD;  Location: [facility] CATH LAB;  Service: Cardiovascular;  Laterality: N/A;  . LEFT HEART CATH AND CORS/GRAFTS ANGIOGRAPHY N/A 05/21/2016   Procedure: LEFT HEART CATH AND CORS/GRAFTS ANGIOGRAPHY;  Surgeon: Lorretta Harp, MD;  Location: [place] CV LAB;  Service: Cardiovascular;; 95% in-stent restenosis of mid RCA treated with Cutting Balloon PTCA.  6% ramus.  Patent LIMA-LAD and SVG-OM.  Distal LAD and OM 2 -75% after graft insertion  . LEFT HEART CATHETERIZATION WITH CORONARY ANGIOGRAM N/A 02/23/2013   Procedure: LEFT HEART CATHETERIZATION WITH CORONARY ANGIOGRAM;  Surgeon: Lorretta Harp, MD;  Location: [facility] CATH LAB;  Service: Cardiovascular;  Laterality: N/A;  . LEFT HEART CATHETERIZATION WITH CORONARY/GRAFT ANGIOGRAM N/A 04/30/2013   Procedure: LEFT HEART CATHETERIZATION WITH Beatrix Fetters;  Surgeon: Leonie Man, MD;  Location: [facility] CATH LAB;  Service:  Cardiovascular;  Laterality: N/A;  . LEFT HEART CATHETERIZATION WITH CORONARY/GRAFT ANGIOGRAM N/A 10/15/2013   Procedure: LEFT HEART CATHETERIZATION WITH Beatrix Fetters;  Surgeon: Leonie Man, MD;  Location: [facility]  CATH LAB;  Service: Cardiovascular;  Laterality: N/A;  . LEFT HEART CATHETERIZATION WITH CORONARY/GRAFT ANGIOGRAM N/A 02/09/2014   Procedure: LEFT HEART CATHETERIZATION WITH Beatrix Fetters;  Surgeon: Leonie Man, MD;  Location: [facility] CATH LAB;  Service: Cardiovascular;  Laterality: N/A;  . Lower Extremity Arterial Dopplers  October 2014   Calcified but non-occlusive peripheral arteries. No evidence of stenosis  . NM MYOCAR PERF WALL MOTION  10/12/2011   EF 54%,LV normal ; no signifiant ischemia  . PERCUTANEOUS CORONARY STENT INTERVENTION (PCI-S)  02/23/2013   mid RCA 80% & 70% - Xience 2.75 mm x 15 mm ( 3.64mm) ; LIMA-LAD patent, SVG-OM patent (stent patent); SVG-Diag patent.  Marland Kitchen PERCUTANEOUS CORONARY STENT INTERVENTION (PCI-S)  April 2015   dRCA - Integrity Resolute DES   2.25 mm x 46mm (2.5 mm)  . PERCUTANEOUS CORONARY STENT INTERVENTION (PCI-S)  Oct 15 2013   [place] Angina: mRCA ISR with post-stent stenosis -- C[place] PTCA & PCI Promus Premier DES 2.75 mm x 16 mm (3.25 mm)  . POSTERIOR LUMBAR FUSION    . REVISON OF ARTERIOVENOUS FISTULA Left 28/31/5176   Procedure: PLICATION OF LEFT UPPER ARM  ARTERIOVENOUS FISTULA  ANEURYSM;  Surgeon: Rosetta Posner, MD;  Location: [place];  Service: Vascular;  Laterality: Left;  . TRANSTHORACIC ECHOCARDIOGRAM  02/23/2013   Moderate concentric hypertrophy. EF 45-50%. Septal bounce. Grade 2 diastolic dysfunction (pseudo-normal - severely elevated filling pressures) mild to moderate MR and moderate LA dilation to  . UPPER EXTREMITY ANGIOGRAPHY Left 05/09/2016   Procedure: Upper Extremity Angiography;  Surgeon: Waynetta Sandy, MD;  Location: [place] CV LAB;  Service: Cardiovascular;  Laterality: Left;    Current Meds    Medication Sig  . acetaminophen (TYLENOL) 500 MG tablet Take 1,000 mg by mouth 2 (two) times daily as needed for mild pain.   Marland Kitchen atorvastatin (LIPITOR) 40 MG tablet Take 1 tablet (40 mg total) by mouth daily.  Marland Kitchen Besifloxacin HCl (BESIVANCE) 0.6 % SUSP Place 1 drop into both eyes See admin instructions. Use eye drops 2 times daily for 2 days following injection by Dr. Zigmund Daniel (every 10 weeks)  . cinacalcet (SENSIPAR) 30 MG tablet Take 30 mg by mouth daily.  . clonazePAM (KLONOPIN) 0.5 MG tablet TAKE 1 TABLET BY MOUTH TWICE DAILY AS NEEDED FOR ANXIETY  . clopidogrel (PLAVIX) 75 MG tablet Take 1 tablet (75 mg total) by mouth daily.  . ferric citrate (AURYXIA) 1 GM 210 MG(Fe) tablet Take 420 mg by mouth 3 (three) times daily with meals.  Marland Kitchen glucose blood (TRUE METRIX BLOOD GLUCOSE TEST) test strip 1 each by Other route as needed for other. Use as instructed to test sugars 2-3 times daily. Dx. E11.40  . isosorbide mononitrate (IMDUR) 120 MG 24 hr tablet TAKE 1 TABLET BY MOUTH ONCE DAILY  . levothyroxine (SYNTHROID, LEVOTHROID) 125 MCG tablet TAKE 1 TABLET BY MOUTH ONCE DAILY BEFORE BREAKFAST  . lidocaine-prilocaine (EMLA) cream Apply 1 application topically See admin instructions. Apply prior to dialysis treatments on Tuesday, Thursday and Saturday  . MELATONIN PO Take 1 tablet by mouth at bedtime as needed (sleep).  . metoprolol succinate (TOPROL-XL) 25 MG 24 hr tablet Take 1 tablet (25 mg total) by mouth daily.  . midodrine (PROAMATINE) 2.5 MG tablet Take 1 tablet (2.5 mg total) by mouth 3 (three) times daily with meals.  . nitroGLYCERIN (NITROSTAT) 0.4 MG SL tablet DISSOLVE ONE TABLET UNDER THE TONGUE EVERY 5 MINUTES AS NEEDED FOR CHEST PAIN. UP TO  3 TIMES AN EPISODE  . nortriptyline (PAMELOR) 25 MG capsule Take 1 capsule (25 mg total) by mouth at bedtime.  . [DISCONTINUED] nitroGLYCERIN (NITROSTAT) 0.4 MG SL tablet DISSOLVE ONE TABLET UNDER THE TONGUE EVERY 5 MINUTES AS NEEDED FOR CHEST PAIN. DO NOT  EXCEED A TOTAL OF 3 DOSES IN 15 MINUTES  -- just completed Rx for Thrush - now on new med for "fire mouth"   Allergies  Allergen Reactions  . Brilinta [Ticagrelor] Shortness Of Breath  . Shellfish Allergy Other (See Comments)    Causes gout flare-ups   Social History   Tobacco Use  . Smoking status: Never Smoker  . Smokeless tobacco: Never Used  Substance Use Topics  . Alcohol use: No  . Drug use: No    Social History   Social History Narrative   Long-term patient of Dr. Rollene Fare.   Married father of 49, grandfather 70.   Never smoked.   Not exercising D2 hip and back pain & now Angina    family history includes Diabetes in his father and mother; Heart attack in his father and mother; Heart disease in his father and mother; Hypertension in his father and mother; Stroke in his paternal aunt and paternal uncle.  Wt Readings from Last 3 Encounters:  11/01/17 146 lb (66.2 kg)  10/30/17 146 lb 3.2 oz (66.3 kg)  10/21/17 147 lb (66.7 kg)    PHYSICAL EXAM BP (!) 102/52   Pulse 63   Ht 5\' 5"  (1.651 m)   Wt 146 lb 3.2 oz (66.3 kg)   SpO2 99%   BMI 24.33 kg/m   Physical Exam  Constitutional: He is oriented to person, place, and time. No distress.  Chronically ill-appearing.  No acute distress.  Well-groomed  HENT:  Head: Normocephalic and atraumatic.  Has essentially lost all of his hair. Poor dentition  Neck: Neck supple. No hepatojugular reflux and no JVD present. Carotid bruit is not present (More consistent with left subclavian bruit fromm his fistula).  Cardiovascular: Normal rate and regular rhythm.  Occasional extrasystoles are present. PMI is not displaced. Exam reveals distant heart sounds and decreased pulses (L wrist (but palpable) - mildy reduced pedal pulses). Exam reveals no gallop, no S4 and no friction rub.  No murmur (can hear a holosystolic bruit throughout the precordium that is most likely related to his fistula) heard. Can hear bruit of fistula    Pulmonary/Chest: Effort normal and breath sounds normal. No respiratory distress. He has no wheezes. He has no rales.  Abdominal: Soft. Bowel sounds are normal. He exhibits no distension. There is no tenderness. There is no rebound.  No HSM  Musculoskeletal: Normal range of motion. He exhibits no edema.  Neurological: He is alert and oriented to person, place, and time.  Skin: Skin is warm and dry. No erythema. There is pallor.  Diffuse bruising on hands & forearms.  Psychiatric: He has a normal mood and affect. His behavior is normal. Judgment and thought content normal.  Nursing note and vitals reviewed.   Adult ECG Report n/a  Other studies Reviewed: Additional studies/ records that were reviewed today include:  Recent Labs:   Lab Results  Component Value Date   CHOL 119 10/21/2017   HDL 25.20 (L) 10/21/2017   LDLCALC 65 10/21/2017   LDLDIRECT 154.0 04/22/2017   TRIG 146.0 10/21/2017   CHOLHDL 5 10/21/2017    ASSESSMENT / PLAN: Problem List Items Addressed This Visit    Chronic diastolic congestive heart failure, NYHA  class 2 (HCC) (Chronic)    Maintains regular relatively stable EF with clear diastolic dysfunction. Pretty much volume levels are controlled with dialysis. Does not have low blood pressure did not consider afterload reduction.      Relevant Medications   nitroGLYCERIN (NITROSTAT) 0.4 MG SL tablet   Chronic stable angina (HCC) - Primary (Chronic)    He does not want to do nitro glycerin patch.  We cannot do Ranexa.  Does not blood pressure room to use amlodipine anymore, and only tolerating low-dose Toprol.  Unfortunately, this really limits his activity, but even after angioplasty, he has not been that active.  Following next angioplasty, would hold Imdur for at least a week and then restart at lower dose.      Relevant Medications   nitroGLYCERIN (NITROSTAT) 0.4 MG SL tablet   Coronary artery disease involving native coronary artery of native heart  with angina pectoris (HCC) (Chronic)    He really does have chronic stable angina.  Still probably class II angina despite our best efforts.  We really only go back to the Cath Lab when he gets to class III class IV angina. We tried Pletal and that did not work, he cannot take Ranexa so I therefore limited to high-dose Imdur.  It would be nice to give him an Imdur holiday if and when he has another PCI. He remains on Plavix without aspirin because of significant bleeding and bruising.  He is on very low-dose Toprol and taking it only according to dialysis days. He does take stable dose of atorvastatin.  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  Also entire RCA is treated with stents and and requires guide liner to get down to that location.  He has had a stent in 1 of his grafts but this been patent for the last 6 years plus. At this point, he is not ready to go back to the Cath Lab although his wife is.  They think it may be within the next 3 to 4 months.  I will therefore see him back in early January.  He wants to try to make it through Christmas.      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  Looking back at his angiography, it appears that this area may not have been fully prepared at the time of his initial stent and therefore there is probably some residual calcium impingement on stent struts.  Unfortunately the area in question is always difficult to address and requires the use of guide liner just to simply get the balloons down through the multiple stents upstream.  Try to avoid going to the Cath Lab as much as possible, but has been able to go at least a year to year and a half of late -previously have  been every 3 to 4 months.      Hyperlipidemia with target LDL less than 70 (Chronic)    Labs from April showed his LDL 154.  They were just checked in October after having restarted statin and his LDL is now 39.  Continue current dose of atorvastatin.      Relevant Medications   nitroGLYCERIN (NITROSTAT) 0.4 MG SL tablet     As  is usually the case, I spent at least 25 to 30 minutes with the patient discussing his condition and progression as well as potential medical options.  50% is spent in direct patient counseling.  Current medicines are reviewed at length with the patient today. (+/- concerns) n/a The following changes have been made: n/a  Patient Instructions  Medication Instructions:  NO MEDICATION CHANGES If you need a refill on your cardiac medications before your next appointment, please call your pharmacy.   Lab work:NOT NEEEDED If you have labs (blood work) drawn today and your tests are completely normal, you will receive your results only by: Marland Kitchen MyChart Message (if you have MyChart) OR . A paper copy in the mail If you have any lab test that is abnormal or we need to change your treatment, we will call you to review the results.  Testing/Procedures: NOT  NEEDED  Follow-Up: At [facility], you and your health needs are our priority.  As part of our continuing mission to provide you with exceptional heart care, we have created designated Provider Care Teams.  These Care Teams include your primary Cardiologist (physician) and Advanced Practice Providers (APPs -  Physician Assistants and Nurse Practitioners) who all work together to provide you with the care you need, when you need it. . Your physician recommends that you schedule a follow-up appointment in Jan 2020 with DR [facility]. .   Any Other Special Instructions Will Be Listed Below (If Applicable).    Studies Ordered:   No orders of the defined types were placed in this encounter.     [name],  M.D., M.S. Interventional Cardiologist   Pager # [phone] Phone # [phone] [address] [place], [place] [address]

## 2017-10-31 ENCOUNTER — Encounter: Payer: Self-pay | Admitting: Family Medicine

## 2017-10-31 ENCOUNTER — Ambulatory Visit: Payer: Medicare Other | Admitting: Family Medicine

## 2017-11-01 ENCOUNTER — Ambulatory Visit (INDEPENDENT_AMBULATORY_CARE_PROVIDER_SITE_OTHER): Payer: Medicare Other | Admitting: Family Medicine

## 2017-11-01 ENCOUNTER — Encounter: Payer: Self-pay | Admitting: Family Medicine

## 2017-11-01 ENCOUNTER — Other Ambulatory Visit: Payer: Self-pay

## 2017-11-01 VITALS — BP 131/81 | HR 66 | Temp 98.3°F | Resp 17 | Ht 65.0 in | Wt 146.0 lb

## 2017-11-01 DIAGNOSIS — Z9861 Coronary angioplasty status: Secondary | ICD-10-CM

## 2017-11-01 DIAGNOSIS — K117 Disturbances of salivary secretion: Secondary | ICD-10-CM | POA: Diagnosis not present

## 2017-11-01 DIAGNOSIS — I2510 Atherosclerotic heart disease of native coronary artery without angina pectoris: Secondary | ICD-10-CM | POA: Diagnosis not present

## 2017-11-01 DIAGNOSIS — K146 Glossodynia: Secondary | ICD-10-CM | POA: Diagnosis not present

## 2017-11-01 MED ORDER — GABAPENTIN 100 MG PO CAPS: 100.0000 mg | ORAL_CAPSULE | Freq: Three times a day (TID) | ORAL | 3 refills | Status: DC

## 2017-11-01 NOTE — Progress Notes (Signed)
   Subjective:    Patient ID: [name] [name], male    DOB: 12/04/1940, 77 y.o.   MRN: [id number]  HPI Burning mouth- no relief w/ increased dose of Nortriptyline (started 10 days ago).  No worse.  Pt is now drooling more frequently.  'everything I put in my mouth feels like a hot spice'.  Already on Clonazepam.  sxs are not worse at a particular time of day.  Pt has not seen neuro.   Review of Systems For ROS see HPI     Objective:   Physical Exam  Constitutional: He is oriented to person, place, and time. He appears well-developed and well-nourished. No distress.  HENT:  Head: Normocephalic and atraumatic.  Tongue WNL  Neurological: He is alert and oriented to person, place, and time.  No drooling noted  Skin: Skin is warm and dry.  Vitals reviewed.         Assessment & Plan:

## 2017-11-01 NOTE — Patient Instructions (Signed)
Follow up as needed or as scheduled START the Gabapentin nightly x1 week and then increase to twice daily x1 week, and then increase to 3x/day CONTINUE the Nortriptyline as directed We'll call you with your neuro referral Call with any questions or concerns Hang in there!

## 2017-11-02 ENCOUNTER — Encounter: Payer: Self-pay | Admitting: Cardiology

## 2017-11-02 NOTE — Assessment & Plan Note (Signed)
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  Still probably class II angina despite our best efforts.  We really only go back to the Cath Lab when he gets to class III class IV angina. We tried Pletal and that did not work, he cannot take Ranexa so I therefore limited to high-dose Imdur.  It would be nice to give him an Imdur holiday if and when he has another PCI. He remains on Plavix without aspirin because of significant bleeding and bruising.  He is on very low-dose Toprol and taking it only according to dialysis days. He does take stable dose of atorvastatin.  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  Also entire RCA is treated with stents and and requires guide liner to get down to that location.  He has had a stent in 1 of his grafts but this been patent for the last 6 years plus. At this point, he is not ready to go back to the Cath Lab although his wife is.  They think it may be within the next 3 to 4 months.  I will therefore see him back in early January.  He wants to try to make it through Christmas.

## 2017-11-02 NOTE — Assessment & Plan Note (Signed)
He does not want to do nitro glycerin patch.  We cannot do Ranexa.  Does not blood pressure room to use amlodipine anymore, and only tolerating low-dose Toprol.  Unfortunately, this really limits his activity, but even after angioplasty, he has not been that active.  Following next angioplasty, would hold Imdur for at least a week and then restart at lower dose.

## 2017-11-02 NOTE — Assessment & Plan Note (Signed)
Labs from April showed his LDL 154.  They were just checked in October after having restarted statin and his LDL is now 81.  Continue current dose of atorvastatin.

## 2017-11-02 NOTE — Assessment & Plan Note (Signed)
Multiple areas of angioplasty on the RCA, most prominent site has remained the same in the stent with in-stent with in-stent segment.  Looking back at his angiography, it appears that this area may not have been fully prepared at the time of his initial stent and therefore there is probably some residual calcium impingement on stent struts.  Unfortunately the area in question is always difficult to address and requires the use of guide liner just to simply get the balloons down through the multiple stents upstream.  Try to avoid going to the Cath Lab as much as possible, but has been able to go at least a year to year and a half of late -previously have been every 3 to 4 months.

## 2017-11-02 NOTE — Assessment & Plan Note (Addendum)
Maintains regular relatively stable EF with clear diastolic dysfunction. Pretty much volume levels are controlled with dialysis. Does not have low blood pressure did not consider afterload reduction.

## 2017-11-05 NOTE — Assessment & Plan Note (Signed)
Ongoing issue.  No improvement w/ increased dose of Nortriptyline but pt just changed this 10 days.  He is already on Clonazepam.  Given the neuropathic nature of pain, will start Gabapentin while awaiting neuro referral.  Pt expressed understanding and is in agreement w/ plan.

## 2017-11-18 ENCOUNTER — Other Ambulatory Visit: Payer: Self-pay | Admitting: Family Medicine

## 2018-01-29 ENCOUNTER — Encounter: Payer: Self-pay | Admitting: Cardiology

## 2018-01-29 ENCOUNTER — Ambulatory Visit (INDEPENDENT_AMBULATORY_CARE_PROVIDER_SITE_OTHER): Payer: Medicare Other | Admitting: Cardiology

## 2018-01-29 VITALS — BP 88/64 | HR 66 | Ht 65.0 in | Wt 143.2 lb

## 2018-01-29 DIAGNOSIS — Z9861 Coronary angioplasty status: Secondary | ICD-10-CM | POA: Diagnosis not present

## 2018-01-29 DIAGNOSIS — I953 Hypotension of hemodialysis: Secondary | ICD-10-CM

## 2018-01-29 DIAGNOSIS — I208 Other forms of angina pectoris: Secondary | ICD-10-CM

## 2018-01-29 DIAGNOSIS — Z955 Presence of coronary angioplasty implant and graft: Secondary | ICD-10-CM

## 2018-01-29 DIAGNOSIS — E785 Hyperlipidemia, unspecified: Secondary | ICD-10-CM | POA: Diagnosis not present

## 2018-01-29 DIAGNOSIS — I25119 Atherosclerotic heart disease of native coronary artery with unspecified angina pectoris: Secondary | ICD-10-CM

## 2018-01-29 DIAGNOSIS — I25708 Atherosclerosis of coronary artery bypass graft(s), unspecified, with other forms of angina pectoris: Secondary | ICD-10-CM

## 2018-01-29 DIAGNOSIS — I2510 Atherosclerotic heart disease of native coronary artery without angina pectoris: Secondary | ICD-10-CM

## 2018-01-29 NOTE — Patient Instructions (Addendum)
Medication Instructions:  NOT NEEDED If you need a refill on your cardiac medications before your next appointment, please call your pharmacy.   Lab work: NOT NEEDED If you have labs (blood work) drawn today and your tests are completely normal, you will receive your results only by: Marland Kitchen MyChart Message (if you have MyChart) OR . A paper copy in the mail If you have any lab test that is abnormal or we need to change your treatment, we will call you to review the results.  Testing/Procedures: NOT NEEDED  Follow-Up: At [facility], you and your health needs are our priority.  As part of our continuing mission to provide you with exceptional heart care, we have created designated Provider Care Teams.  These Care Teams include your primary Cardiologist (physician) and Advanced Practice Providers (APPs -  Physician Assistants and Nurse Practitioners) who all work together to provide you with the care you need, when you need it. . Your physician recommends that you schedule a follow-up appointment in 3 month with DR HARDING .   Any Other Special Instructions Will Be Listed Below (If Applicable).

## 2018-01-29 NOTE — Progress Notes (Signed)
PCP: Midge Minium, MD  Clinic Note: Chief Complaint  Patient presents with  . Follow-up    Relatively stable.  Not ready for cath yet.  . Coronary Artery Disease    Relatively stable angina    HPI: [name] [name] is a 78 y.o. male with a PMH notable for MVCAD - CABG & mulitiple PCI/PTCA of RCA for ISR who presents today for 3 month follow-up. -- Most recent event: 05/18/2016 non-STEMI --> cutting balloon PTCA of mRCA ISR Interestingly, most of his angina comes from his RCA as the remainder of his system seems to be stable.  Unfortunately, in order to allow his blood pressure to be higher, we have been forced to back down his antianginal medicines --> coincided with him having to take more nitroglycerin.  He noted that the midodrine really did not help him much.  [name] [name] was last seen in Oct 2019--> relatively stable - - did note more NTG requirement.  Not yet to CATH-PTCA stage.    Maybe using a little bit more nitroglycerin than before, but not to the point where he wants to have a cath.  Occasionally angina decubitus but not always.  Was back taking low-dose metoprolol intermittently depending on his dialysis schedule.   Recent Hospitalizations:  n/a  Studies Personally Reviewed - (if available, images/films reviewed: From Epic Chart or Care Everywhere)  No new studies  Interval History: [name] [name] returns today overall relatively stable.  Maybe a little bit more nitro use than before.  At least once a day and almost always except with exertion.  Not at rest unless he is lying down at night.  Usually if he does anything above normal level of activity, he will have it.  He does have some PND and orthopnea symptoms but views of that is correlating with when he is pending dialysis. They have been adjusting his dry weight a little bit this and the fact that he is lost weight, current dry weight is roughly 64 kg and they usually try to get him down to 63.6 kg postdialysis.  Overall  EP probably just tending to lose a little bit more weight now.  He now really starts to take a nap after dialysis as opposed to being on to feel better.  He says about twice in the last month he is almost passed out getting dizzy and having some near syncope symptoms.  He has not had any more episodes since then and thinks maybe he was dealing with a little bit of GI upset at the time as well.  He tells me his energy level is not any better but not that much worse than it had been before.  He seems to be doing okay with the metoprolol that we would get him on.  He is not taking midodrine anymore because it did not seem to help.  No rapid irregular heartbeats/palpitations.  No TIA or amaurosis fugax.  No true syncope symptoms.  Walking enough to notice any claudication.  No melena, hematochezia or epistaxis.  He does bruise easily though..  ROS: A comprehensive was performed. Pertinent symptoms noted in HPI> Review of Systems  Constitutional: Positive for chills and malaise/fatigue. Negative for fever and weight loss.  HENT: Positive for congestion (Having some mild congestion, but does not think he is truly getting a cold.). Negative for nosebleeds.   Respiratory: Negative for cough and wheezing.   Cardiovascular:       Per history of present illness  Gastrointestinal: Negative for abdominal pain and heartburn.  Musculoskeletal: Positive for joint pain. Negative for falls and myalgias.  Neurological: Positive for dizziness (Per HPI ) and weakness (Generalized). Negative for focal weakness and headaches.  Endo/Heme/Allergies: Bruises/bleeds easily.  Psychiatric/Behavioral: Positive for depression and memory loss. The patient is not nervous/anxious and does not have insomnia.   All other systems reviewed and are negative.   I have reviewed and (if needed) personally updated the patient's problem list, medications, allergies, past medical and surgical history, social and family history.   Past  Medical History:  Diagnosis Date  . Anemia    Likely secondary to history of GI bleed  . Anginal pain (HCC)    Chronic stable  . Anxiety   . Arthritis   . BPH (benign prostatic hyperplasia)   . CAD (coronary artery disease) of bypass graft 2006, 2012   In 2006: Occluded SVG-OM noted (SVG-diagonal was previously occluded); 2012: Severe lesion in redo SVG-OM1 --> BMS PCI   . CAD in native artery; and the grafts 1992, 1997, 2002, 2006, 2012   CABG 1992 and redo CABG 2006  . CAD S/P percutaneous coronary angioplasty October 2012; Feb, Apr & Oct 2015; Feb 2016   a) s/p CABG 1992 and redo 2006. b) 10/12: NSTEMI: PCI to SVG-OM1 Integrity BMS 3.5 x 15 (3.85 mm) c) 2/15: UA - PCI mid RCA Xience Ap DES 2.75 x 15 (3.1 mm). d) 4/15 : UA - focal dRCA Resolute DES 2.25 x 8 (2.5 mm). e) 10/15: PCI to mRCA ISR w/ post stent lesion - Promus P 2.75 x 16 (3.25 mm). f) 2/16: NSTEMI: CBA/PTCA of  mRCA ISR (3.5 mm post-dilation); g) 7/16 ISR RCA->CBA/PTCA, residual 20%.  . Carotid arterial disease ([place])    a) Duplex 05/2012: R BULB/PROX ICA: 50-69%, L BULB/PROX ICA: 0-49%. ;; 04/29/2014: 81-85% RICA, 63-14% LICA. Patetn vertebrals,  . Chronic low back pain   . Colon cancer [facility]) 2003   colectomy for CA. no recurrence . never required  radiation or chem  . Diabetes mellitus without complication ([place])   . Dyspnea    "when I get too much Fluid"  . End stage renal disease on dialysis [facility])    Dialyzes at [facility]: Tues/Th/Sat - left upper cavity AV fistula brachiocephalic  . Gout   . Heart murmur   . History of hiatal hernia   . History of Non-ST elevated myocardial infarction (non-STEMI) 1992, 2006, 2012; 02/2013  . Hyperlipidemia   . Hypertension   . Hypothyroidism    On supplementation  . Ischemic cardiomyopathy    a) EF 45-50% by echo 2015. b) EF 50% by cath 04/2014.  Marland Kitchen LBBB (left bundle branch block)    Chronic  . Peptic ulcer disease  2004  . Plavix resistance    a) P2Y12 264 in 02/2014 - put  on Brilinta but did not tolerate due to SOB. b) cannot be on Effient due to history of stroke. c) Plavix increased to 75mg  BID and Pletal added 04/2014 due to ISR.  Marland Kitchen Pneumonia   . Skin cancer 02/1999   nose  . Stroke [facility]) 1998    Past Surgical History:  Procedure Laterality Date  . AORTIC ARCH ANGIOGRAPHY N/A 05/09/2016   Procedure: Aortic Arch Angiography;  Surgeon: Waynetta Sandy, MD;  Location: [place] CV LAB;  Service: Cardiovascular;  Laterality: N/A;  . APPENDECTOMY    . AV FISTULA REPAIR Left 05/2009  . CARDIAC CATHETERIZATION  10/16/2010  patent  LIMA to the LAD , PATENT  svg TO 2nd marginals ,occluded PLA with collaterals and SVG to the OM  had 90% stenosis  was txlge  bare - metal  3.5  Integrity ten  postdilate  36-37  mm    . CARDIAC CATHETERIZATION  03/22/2004   loss of both SVG,severe disease in prox and ostial  circ not amenable to invention. mod diease distal LAD  AFTER BYPASS GRAFT;-CVTS to evaluate   . CARDIAC CATHETERIZATION N/A 08/03/2014   Procedure: Left Heart Cath and Cors/Grafts Angiography;  Surgeon: Leonie Man, MD;  Location: [place] [facility];  Service: Cardiovascular;  Laterality: N/A;  . CARDIAC CATHETERIZATION N/A 08/03/2014   Procedure: Coronary Balloon Angioplasty;  Surgeon: Leonie Man, MD;  Location: [place] CV LAB;  Service: Cardiovascular;: Aggressive Cutting Balloon - [place] Balloon PTCA of ISR site in mRCA (3 stent layer)  . CAROTID DOPPLER  05/23/2012   ABN CAROTID-- RGT BULB/PROX ICA mild to mod 50-60%;lft bulb/prox mild to mod 0-49%;left subclavian abn waveforms consistent with patients lft arm A/V fistula  . CATARACT EXTRACTION W/ INTRAOCULAR LENS  IMPLANT, BILATERAL Bilateral   . CHOLECYSTECTOMY  2009  . COLON RESECTION  2003   transverse and proximal descending w/ primar anastomosis  . COLON SURGERY  2003   "cancer"  . COLONOSCOPY    . CORONARY ANGIOPLASTY  04/03/1995   OM and Circ  . CORONARY ANGIOPLASTY  02/01/2000    CIRC  . CORONARY ANGIOPLASTY  02/09/14   Cutting Balloon PTCA of mRCA ISR 99% - 3.5 mm  . CORONARY ARTERY BYPASS GRAFT  08/22/1990   INITIAL:LIMA to LAD, SVG to  Diagonal, SVG to OM  . CORONARY ARTERY BYPASS GRAFT  2006   SVG to OM1,SVG to OM2 with patent LIMA  to LAD and occluded vein  graft to the diagonal  and occluded vein grft to OM from  prior  surgery  . CORONARY ATHERECTOMY  05/21/2016   Successful PTCA and cutting balloon atherectomy of mid dominant RCA "in-stent restenosis of the lesion that had been intervened on close to 2 years ago with an excellent result.   . CORONARY BALLOON ANGIOPLASTY N/A 05/21/2016   Procedure: Coronary Balloon Angioplasty;  Surgeon: Lorretta Harp, MD;  Location: [place] [facility];  Service: Cardiovascular:: Redo Cutting Balloon angioplasty RCA ISR  . DISTAL REVASCULARIZATION AND INTERVAL LIGATION (DRIL) Left 05/14/2016   Procedure: DISTAL REVASCULARIZATION AND INTERVAL LIGATION (DRIL) LEFT ARM;  Surgeon: Waynetta Sandy, MD;  Location: [place];  Service: Vascular;  Laterality: Left;  . DOPPLER ECHOCARDIOGRAPHY  01/25/2012   EF 50 to 55%  . EVENT MONITOR  01/23/2012-02/06/2012   SINUS ,LBBB,unifocal PVCs  . LEFT AND RIGHT HEART CATHETERIZATION WITH CORONARY/GRAFT ANGIOGRAM N/A 04/20/2014   Procedure: LEFT AND RIGHT HEART CATHETERIZATION WITH Beatrix Fetters;  Surgeon: Sherren Mocha, MD;  Location: [facility] CATH LAB;  Service: Cardiovascular;  Laterality: N/A;  . LEFT HEART CATH AND CORS/GRAFTS ANGIOGRAPHY N/A 05/21/2016   Procedure: LEFT HEART CATH AND CORS/GRAFTS ANGIOGRAPHY;  Surgeon: Lorretta Harp, MD;  Location: [place] CV LAB;  Service: Cardiovascular;; 95% in-stent restenosis of mid RCA treated with Cutting Balloon PTCA.  6% ramus.  Patent LIMA-LAD and SVG-OM.  Distal LAD and OM 2 -75% after graft insertion  . LEFT HEART CATHETERIZATION WITH CORONARY ANGIOGRAM N/A 02/23/2013   Procedure: LEFT HEART CATHETERIZATION WITH CORONARY ANGIOGRAM;   Surgeon: Lorretta Harp, MD;  Location: [facility] CATH LAB;  Service:  Cardiovascular;  Laterality: N/A;  . LEFT HEART CATHETERIZATION WITH CORONARY/GRAFT ANGIOGRAM N/A 04/30/2013   Procedure: LEFT HEART CATHETERIZATION WITH Beatrix Fetters;  Surgeon: Leonie Man, MD;  Location: [facility] CATH LAB;  Service: Cardiovascular;  Laterality: N/A;  . LEFT HEART CATHETERIZATION WITH CORONARY/GRAFT ANGIOGRAM N/A 10/15/2013   Procedure: LEFT HEART CATHETERIZATION WITH Beatrix Fetters;  Surgeon: Leonie Man, MD;  Location: [facility] CATH LAB;  Service: Cardiovascular;  Laterality: N/A;  . LEFT HEART CATHETERIZATION WITH CORONARY/GRAFT ANGIOGRAM N/A 02/09/2014   Procedure: LEFT HEART CATHETERIZATION WITH Beatrix Fetters;  Surgeon: Leonie Man, MD;  Location: [facility] CATH LAB;  Service: Cardiovascular;  Laterality: N/A;  . Lower Extremity Arterial Dopplers  October 2014   Calcified but non-occlusive peripheral arteries. No evidence of stenosis  . NM MYOCAR PERF WALL MOTION  10/12/2011   EF 54%,LV normal ; no signifiant ischemia  . PERCUTANEOUS CORONARY STENT INTERVENTION (PCI-S)  02/23/2013   mid RCA 80% & 70% - Xience 2.75 mm x 15 mm ( 3.27mm) ; LIMA-LAD patent, SVG-OM patent (stent patent); SVG-Diag patent.  Marland Kitchen PERCUTANEOUS CORONARY STENT INTERVENTION (PCI-S)  April 2015   dRCA - Integrity Resolute DES   2.25 mm x 69mm (2.5 mm)  . PERCUTANEOUS CORONARY STENT INTERVENTION (PCI-S)  Oct 15 2013   [place] Angina: mRCA ISR with post-stent stenosis -- [place] PTCA & PCI Promus Premier DES 2.75 mm x 16 mm (3.25 mm)  . POSTERIOR LUMBAR FUSION    . REVISON OF ARTERIOVENOUS FISTULA Left 11/04/2534   Procedure: PLICATION OF LEFT UPPER ARM  ARTERIOVENOUS FISTULA  ANEURYSM;  Surgeon: Rosetta Posner, MD;  Location: [place];  Service: Vascular;  Laterality: Left;  . TRANSTHORACIC ECHOCARDIOGRAM  02/23/2013   Moderate concentric hypertrophy. EF 45-50%. Septal bounce. Grade 2 diastolic dysfunction (pseudo-normal -  severely elevated filling pressures) mild to moderate MR and moderate LA dilation to  . UPPER EXTREMITY ANGIOGRAPHY Left 05/09/2016   Procedure: Upper Extremity Angiography;  Surgeon: Waynetta Sandy, MD;  Location: [place] CV LAB;  Service: Cardiovascular;  Laterality: Left;    Current Meds  Medication Sig  . acetaminophen (TYLENOL) 500 MG tablet Take 1,000 mg by mouth 2 (two) times daily as needed for mild pain.   Marland Kitchen atorvastatin (LIPITOR) 40 MG tablet Take 1 tablet (40 mg total) by mouth daily.  Marland Kitchen Besifloxacin HCl (BESIVANCE) 0.6 % SUSP Place 1 drop into both eyes See admin instructions. Use eye drops 2 times daily for 2 days following injection by Dr. Zigmund Daniel (every 10 weeks)  . cinacalcet (SENSIPAR) 30 MG tablet Take 30 mg by mouth daily.  . clonazePAM (KLONOPIN) 0.5 MG tablet TAKE 1 TABLET BY MOUTH TWICE DAILY AS NEEDED FOR ANXIETY  . clopidogrel (PLAVIX) 75 MG tablet Take 1 tablet (75 mg total) by mouth daily.  . ferric citrate (AURYXIA) 1 GM 210 MG(Fe) tablet Take 420 mg by mouth 3 (three) times daily with meals.  Marland Kitchen glucose blood (TRUE METRIX BLOOD GLUCOSE TEST) test strip 1 each by Other route as needed for other. Use as instructed to test sugars 2-3 times daily. Dx. E11.40  . isosorbide mononitrate (IMDUR) 120 MG 24 hr tablet TAKE 1 TABLET BY MOUTH ONCE DAILY  . levothyroxine (SYNTHROID, LEVOTHROID) 125 MCG tablet TAKE 1 TABLET BY MOUTH ONCE DAILY BEFORE BREAKFAST  . lidocaine-prilocaine (EMLA) cream Apply 1 application topically See admin instructions. Apply prior to dialysis treatments on Tuesday, Thursday and Saturday  . MELATONIN PO Take 1 tablet by mouth at bedtime  as needed (sleep).  . metoprolol succinate (TOPROL-XL) 25 MG 24 hr tablet Take 1 tablet (25 mg total) by mouth daily.  . nitroGLYCERIN (NITROSTAT) 0.4 MG SL tablet DISSOLVE ONE TABLET UNDER THE TONGUE EVERY 5 MINUTES AS NEEDED FOR CHEST PAIN. UP TO 3 TIMES AN EPISODE  . nortriptyline (PAMELOR) 25 MG capsule  Take 1 capsule (25 mg total) by mouth at bedtime.  - still has issues with Thrush   Allergies  Allergen Reactions  . Brilinta [Ticagrelor] Shortness Of Breath  . Shellfish Allergy Other (See Comments)    Causes gout flare-ups   Social History   Tobacco Use  . Smoking status: Never Smoker  . Smokeless tobacco: Never Used  Substance Use Topics  . Alcohol use: No  . Drug use: No    Social History   Social History Narrative   Long-term patient of Dr. Rollene Fare.   Married father of 55, grandfather 37.   Never smoked.   Not exercising D2 hip and back pain & now Angina    family history includes Diabetes in his father and mother; Heart attack in his father and mother; Heart disease in his father and mother; Hypertension in his father and mother; Stroke in his paternal aunt and paternal uncle.  Wt Readings from Last 3 Encounters:  01/29/18 143 lb 3.2 oz (65 kg)  11/01/17 146 lb (66.2 kg)  10/30/17 146 lb 3.2 oz (66.3 kg)    PHYSICAL EXAM BP (!) 88/64   Pulse 66   Ht 5\' 5"  (1.651 m)   Wt 143 lb 3.2 oz (65 kg)   BMI 23.83 kg/m   Physical Exam  Constitutional: He is oriented to person, place, and time. No distress.  Chronically ill-appearing, seems a little bit smaller and thinner than last time..  No acute distress.  Well-groomed  HENT:  Head: Normocephalic and atraumatic.  Has essentially lost all of his hair. Poor dentition  Neck: Normal range of motion. Neck supple. No hepatojugular reflux and no JVD present. Carotid bruit is not present (More consistent with left subclavian bruit fromm his fistula).  Cardiovascular: Normal rate and regular rhythm.  No extrasystoles are present. PMI is not displaced. Exam reveals distant heart sounds and decreased pulses (L wrist (but palpable) - mildy reduced pedal pulses). Exam reveals no gallop, no S4 and no friction rub.  No murmur (can hear a holosystolic bruit throughout the precordium that is most likely related to his fistula)  heard. Can hear bruit of fistula  Pulmonary/Chest: Effort normal and breath sounds normal. No respiratory distress. He has no wheezes. He has no rales.  Abdominal: Soft. Bowel sounds are normal. He exhibits no distension. There is no abdominal tenderness. There is no rebound.  No HSM  Musculoskeletal: Normal range of motion.        General: No edema.  Neurological: He is alert and oriented to person, place, and time.  Skin: Skin is warm and dry. There is pallor.  Diffuse bruising on hands & forearms.  Psychiatric: He has a normal mood and affect. His behavior is normal. Judgment and thought content normal.  Nursing note and vitals reviewed.   Adult ECG Report  Rate: 66 ;  Rhythm: normal sinus rhythm; right atrial enlargement, LBBB.  Narrative Interpretation: Relatively stable EKG with normal axis, intervals and durations.   Other studies Reviewed: Additional studies/ records that were reviewed today include:  Recent Labs:   Lab Results  Component Value Date   CHOL 119 10/21/2017  HDL 25.20 (L) 10/21/2017   LDLCALC 65 10/21/2017   LDLDIRECT 154.0 04/22/2017   TRIG 146.0 10/21/2017   CHOLHDL 5 10/21/2017    ASSESSMENT / PLAN: Problem List Items Addressed This Visit    CAD (coronary artery disease) of bypass graft - PCI to SVG-OM with BMS; PCI-mid RCA with a Xience DES (Chronic)   CAD S/P percutaneous coronary angioplasty (Chronic)    Pretty much along term lifelong Plavix.  Max tolerated dose of beta-blocker and Imdur.  Unable to tolerate Pletal anymore because of blood pressures.  Would be okay to hold Plavix for procedures, but otherwise continue on.  Not many more options if his angina gets worse then to go back to the Cath Lab      Chronic stable angina (HCC) (Chronic)    Seems to be doing relatively well.  He told me he does not want a nitroglycerin patch.  No other options now with his blood pressure besides simply Toprol and low-dose as well as max dose Imdur.  The  plan will be following next angioplasty to reduce Imdur by holding it for a week and then restarting a low-dose to allow [place] to then titrated up further.      Relevant Orders   EKG 12-Lead (Completed)   Coronary artery disease involving native coronary artery of native heart with angina pectoris ([place]) - Primary (Chronic)    Relatively stable angina, but using 1 nitroglycerin a day.  I really do not have much room to go up on any antianginal medicines, he is on dialysis therefore Ranexa is not an option.  He does not have blood pressure to use amlodipine nor can we titrate up his beta-blocker.  He is on max dose Imdur.  We tried cilostazol in the past but that did not help either. Unfortunately with his low blood pressure issues we will have room to further titrate medications.  I asked him about his feelings on the subject and he still does not think that is angina is frequent or significant enough to warrant going back to the Cath Lab.  He is hoping to make it at least 2 years out from the last PCI.  We will simply follow him up routinely in 3 months to see how he is doing, but low threshold to reevaluate.      Relevant Orders   EKG 12-Lead (Completed)   Hyperlipidemia with target LDL less than 70 (Chronic)    Most recent labs show LDL of 65 which does seem to correlate with a direct LDL back in April of 150.  However I am reluctant to do too much more aggressive than his current dose of atorvastatin that he seems to be tolerating well.  Continue to monitor labs.  Should be due this spring.      Hypotension of hemodialysis (Chronic)   Presence of drug coated stent in right coronary artery (Chronic)   Relevant Orders   EKG 12-Lead (Completed)     As is usually the case, I spent at least 25 to 30 minutes with the patient discussing his condition and progression as well as potential medical options.  50% is spent in direct patient counseling.  Current medicines are reviewed at length with  the patient today. (+/- concerns) n/a The following changes have been made: n/a  Patient Instructions  Medication Instructions:  NOT NEEDED If you need a refill on your cardiac medications before your next appointment, please call your pharmacy.   Lab work: NOT NEEDED  If you have labs (blood work) drawn today and your tests are completely normal, you will receive your results only by: Marland Kitchen MyChart Message (if you have MyChart) OR . A paper copy in the mail If you have any lab test that is abnormal or we need to change your treatment, we will call you to review the results.  Testing/Procedures: NOT NEEDED  Follow-Up: At [facility], you and your health needs are our priority.  As part of our continuing mission to provide you with exceptional heart care, we have created designated Provider Care Teams.  These Care Teams include your primary Cardiologist (physician) and Advanced Practice Providers (APPs -  Physician Assistants and Nurse Practitioners) who all work together to provide you with the care you need, when you need it. . Your physician recommends that you schedule a follow-up appointment in 3 month with DR HARDING .   Any Other Special Instructions Will Be Listed Below (If Applicable).    Studies Ordered:   Orders Placed This Encounter  Procedures  . EKG 12-Lead      [name], M.D., M.S. Interventional Cardiologist   Pager # [phone] Phone # [phone] [address] [place], [place] [address]

## 2018-01-31 ENCOUNTER — Encounter: Payer: Self-pay | Admitting: Cardiology

## 2018-01-31 NOTE — Assessment & Plan Note (Signed)
Relatively stable angina, but using 1 nitroglycerin a day.  I really do not have much room to go up on any antianginal medicines, he is on dialysis therefore Ranexa is not an option.  He does not have blood pressure to use amlodipine nor can we titrate up his beta-blocker.  He is on max dose Imdur.  We tried cilostazol in the past but that did not help either. Unfortunately with his low blood pressure issues we will have room to further titrate medications.  I asked him about his feelings on the subject and he still does not think that is angina is frequent or significant enough to warrant going back to the Cath Lab.  He is hoping to make it at least 2 years out from the last PCI.  We will simply follow him up routinely in 3 months to see how he is doing, but low threshold to reevaluate.

## 2018-01-31 NOTE — Assessment & Plan Note (Deleted)
Pretty much along term lifelong Plavix.  Max tolerated dose of beta-blocker and Imdur.  Unable to tolerate Pletal anymore because of blood pressures.  Would be okay to hold Plavix for procedures, but otherwise continue on.  Not many more options if his angina gets worse then to go back to the Cath Lab

## 2018-01-31 NOTE — Assessment & Plan Note (Signed)
Pretty much along term lifelong Plavix.  Max tolerated dose of beta-blocker and Imdur.  Unable to tolerate Pletal anymore because of blood pressures.  Would be okay to hold Plavix for procedures, but otherwise continue on.  Not many more options if his angina gets worse then to go back to the Cath Lab

## 2018-01-31 NOTE — Assessment & Plan Note (Signed)
Seems to be doing relatively well.  He told me he does not want a nitroglycerin patch.  No other options now with his blood pressure besides simply Toprol and low-dose as well as max dose Imdur.  The plan will be following next angioplasty to reduce Imdur by holding it for a week and then restarting a low-dose to allow [place] to then titrated up further.

## 2018-01-31 NOTE — Assessment & Plan Note (Signed)
Most recent labs show LDL of 65 which does seem to correlate with a direct LDL back in April of 150.  However I am reluctant to do too much more aggressive than his current dose of atorvastatin that he seems to be tolerating well.  Continue to monitor labs.  Should be due this spring.

## 2018-02-20 ENCOUNTER — Other Ambulatory Visit: Payer: Self-pay | Admitting: Family Medicine

## 2018-02-21 ENCOUNTER — Encounter (INDEPENDENT_AMBULATORY_CARE_PROVIDER_SITE_OTHER): Payer: Medicare Other | Admitting: Ophthalmology

## 2018-02-21 DIAGNOSIS — E113313 Type 2 diabetes mellitus with moderate nonproliferative diabetic retinopathy with macular edema, bilateral: Secondary | ICD-10-CM

## 2018-02-21 DIAGNOSIS — E11311 Type 2 diabetes mellitus with unspecified diabetic retinopathy with macular edema: Secondary | ICD-10-CM

## 2018-02-21 DIAGNOSIS — H43813 Vitreous degeneration, bilateral: Secondary | ICD-10-CM

## 2018-02-21 DIAGNOSIS — I10 Essential (primary) hypertension: Secondary | ICD-10-CM | POA: Diagnosis not present

## 2018-02-21 DIAGNOSIS — H35033 Hypertensive retinopathy, bilateral: Secondary | ICD-10-CM | POA: Diagnosis not present

## 2018-02-21 NOTE — Telephone Encounter (Signed)
Last OV 11/01/17 Clonazepam last filled 10/28/17 #30 with 3

## 2018-03-02 ENCOUNTER — Other Ambulatory Visit: Payer: Self-pay | Admitting: Cardiology

## 2018-03-25 ENCOUNTER — Other Ambulatory Visit: Payer: Self-pay | Admitting: Family Medicine

## 2018-03-25 NOTE — Telephone Encounter (Signed)
Last OV 11/01/17 Clonazepam last filled 02/21/18 #30 with 0

## 2018-04-09 ENCOUNTER — Encounter: Payer: Self-pay | Admitting: Family Medicine

## 2018-04-09 ENCOUNTER — Ambulatory Visit (INDEPENDENT_AMBULATORY_CARE_PROVIDER_SITE_OTHER): Payer: Medicare Other | Admitting: Family Medicine

## 2018-04-09 ENCOUNTER — Other Ambulatory Visit: Payer: Self-pay

## 2018-04-09 VITALS — BP 144/60 | Ht 65.0 in | Wt 140.0 lb

## 2018-04-09 DIAGNOSIS — E0840 Diabetes mellitus due to underlying condition with diabetic neuropathy, unspecified: Secondary | ICD-10-CM | POA: Diagnosis not present

## 2018-04-09 DIAGNOSIS — I10 Essential (primary) hypertension: Secondary | ICD-10-CM | POA: Diagnosis not present

## 2018-04-09 DIAGNOSIS — N186 End stage renal disease: Secondary | ICD-10-CM | POA: Diagnosis not present

## 2018-04-09 DIAGNOSIS — E039 Hypothyroidism, unspecified: Secondary | ICD-10-CM | POA: Diagnosis not present

## 2018-04-09 DIAGNOSIS — Z794 Long term (current) use of insulin: Secondary | ICD-10-CM

## 2018-04-09 DIAGNOSIS — E785 Hyperlipidemia, unspecified: Secondary | ICD-10-CM

## 2018-04-09 DIAGNOSIS — Z992 Dependence on renal dialysis: Secondary | ICD-10-CM

## 2018-04-09 NOTE — Progress Notes (Signed)
Virtual Visit via Telephone Note  I connected with [name] [name] on 04/09/18 at 11:00 AM EDT by telephone and verified that I am speaking with the correct person using two identifiers.   I discussed the limitations, risks, security and privacy concerns of performing an evaluation and management service by telephone and the availability of in person appointments. I also discussed with the patient that there may be a patient responsible charge related to this service. The patient expressed understanding and agreed to proceed.  Pt is at home and I am in office  History of Present Illness: HTN- chronic problem, on Metoprolol 25mg  on non-HD days.  Continues to have CP- seeing Dr Ellyn Hack every 3 months.  Going through 2 bottles of nitro/month.  'i'm just trying to stay out of the cath lab'.  Denies SOB.    Hyperlipidemia- chronic problem, on Lipitor 40mg .  Denies abd pain, N/V.   Hypothyroid- chronic problem, on Levothyroxine 168mcg daily.  + alopecia- pt reports he has lost all body hair.  DM- chronic problem, was previously on SSI but has not required this recently.  UTD on foot exam, eye exam.  No need for microalbumin due to HD.  Continues to have burning in his mouth- using Nystatin from Dr C which is helping.  Pt continues to lose weight- 'sugars are good'.   Observations/Objective: AAOx3, NAD Pt is able to speak clearly, coherently without shortness of breath or increased work of breathing.  Thought process is linear.  Mood is appropriate.   Assessment and Plan: HTN- chronic problem.  Pt has hx of good control or over controlled (hypotenstion at HD).  Continues to have CP and cardiac issues- cardiology aware.  Unable to check BP today but will check at lab visit.  Hyperlipidemia- tolerating statin w/o difficulty.  Check labs.  Adjust meds prn   Hypothyroid- + alopecia and fatigue.  Likely multifactorial but check labs to ensure no adjustment in thyroid medication needed.  DM- chronic  problem.  Has been off medication as he continues to lose weight due to his chronic medical problems.  UTD on foot exam and eye exams.  Check labs.  Adjust tx plan prn.  Follow Up Instructions: Lab visit 3-4 months to recheck DM   I discussed the assessment and treatment plan with the patient. The patient was provided an opportunity to ask questions and all were answered. The patient agreed with the plan and demonstrated an understanding of the instructions.   The patient was advised to call back or seek an in-person evaluation if the symptoms worsen or if the condition fails to improve as anticipated.  I provided 13 minutes of non-face-to-face time during this encounter.   [name] Asa, MD

## 2018-04-11 ENCOUNTER — Other Ambulatory Visit: Payer: Self-pay | Admitting: Family Medicine

## 2018-04-11 ENCOUNTER — Other Ambulatory Visit (INDEPENDENT_AMBULATORY_CARE_PROVIDER_SITE_OTHER): Payer: Medicare Other

## 2018-04-11 ENCOUNTER — Telehealth: Payer: Self-pay

## 2018-04-11 VITALS — BP 112/78

## 2018-04-11 DIAGNOSIS — E0840 Diabetes mellitus due to underlying condition with diabetic neuropathy, unspecified: Secondary | ICD-10-CM

## 2018-04-11 DIAGNOSIS — I10 Essential (primary) hypertension: Secondary | ICD-10-CM | POA: Diagnosis not present

## 2018-04-11 DIAGNOSIS — Z794 Long term (current) use of insulin: Secondary | ICD-10-CM

## 2018-04-11 LAB — CBC WITH DIFFERENTIAL/PLATELET
Basophils Absolute: 0.1 10*3/uL (ref 0.0–0.1)
Basophils Relative: 1 % (ref 0.0–3.0)
Eosinophils Absolute: 0.5 10*3/uL (ref 0.0–0.7)
Eosinophils Relative: 6 % — ABNORMAL HIGH (ref 0.0–5.0)
HCT: 38.8 % — ABNORMAL LOW (ref 39.0–52.0)
Hemoglobin: 13.1 g/dL (ref 13.0–17.0)
Lymphocytes Relative: 13 % (ref 12.0–46.0)
Lymphs Abs: 1.2 10*3/uL (ref 0.7–4.0)
MCHC: 33.7 g/dL (ref 30.0–36.0)
MCV: 101.6 fl — ABNORMAL HIGH (ref 78.0–100.0)
Monocytes Absolute: 0.9 10*3/uL (ref 0.1–1.0)
Monocytes Relative: 9.6 % (ref 3.0–12.0)
Neutro Abs: 6.4 10*3/uL (ref 1.4–7.7)
Neutrophils Relative %: 70.4 % (ref 43.0–77.0)
Platelets: 353 10*3/uL (ref 150.0–400.0)
RBC: 3.82 Mil/uL — ABNORMAL LOW (ref 4.22–5.81)
RDW: 14.7 % (ref 11.5–15.5)
WBC: 9.1 10*3/uL (ref 4.0–10.5)

## 2018-04-11 LAB — LIPID PANEL
Cholesterol: 145 mg/dL (ref 0–200)
HDL: 24.1 mg/dL — ABNORMAL LOW (ref >39.00)
LDL Cholesterol: 84 mg/dL (ref 0–99)
NonHDL: 120.83
Total CHOL/HDL Ratio: 6
Triglycerides: 184 mg/dL — ABNORMAL HIGH (ref 0.0–149.0)
VLDL: 36.8 mg/dL (ref 0.0–40.0)

## 2018-04-11 LAB — BASIC METABOLIC PANEL
BUN: 27 mg/dL — ABNORMAL HIGH (ref 6–23)
CO2: 31 mEq/L (ref 19–32)
Calcium: 9.7 mg/dL (ref 8.4–10.5)
Chloride: 92 mEq/L — ABNORMAL LOW (ref 96–112)
Creatinine, Ser: 4.95 mg/dL (ref 0.40–1.50)
GFR: 11.45 mL/min — CL (ref >60.00)
Glucose, Bld: 131 mg/dL — ABNORMAL HIGH (ref 70–99)
Potassium: 5.5 mEq/L — ABNORMAL HIGH (ref 3.5–5.1)
Sodium: 139 mEq/L (ref 135–145)

## 2018-04-11 LAB — HEPATIC FUNCTION PANEL
ALT: 15 U/L (ref 0–53)
AST: 15 U/L (ref 0–37)
Albumin: 4.6 g/dL (ref 3.5–5.2)
Alkaline Phosphatase: 118 U/L — ABNORMAL HIGH (ref 39–117)
Bilirubin, Direct: 0.1 mg/dL (ref 0.0–0.3)
Total Bilirubin: 0.4 mg/dL (ref 0.2–1.2)
Total Protein: 7.2 g/dL (ref 6.0–8.3)

## 2018-04-11 LAB — HEMOGLOBIN A1C: Hgb A1c MFr Bld: 5.4 % (ref 4.6–6.5)

## 2018-04-11 MED ORDER — CLONAZEPAM 0.5 MG PO TABS: 0.5000 mg | ORAL_TABLET | Freq: Two times a day (BID) | ORAL | 1 refills | Status: AC | PRN

## 2018-04-11 NOTE — Progress Notes (Signed)
Quantity changed to 60 at pt request

## 2018-04-11 NOTE — Telephone Encounter (Signed)
Critical labs: creatinine 4.95 GFR 11.44

## 2018-04-14 ENCOUNTER — Other Ambulatory Visit: Payer: Medicare Other

## 2018-04-14 DIAGNOSIS — E039 Hypothyroidism, unspecified: Secondary | ICD-10-CM

## 2018-04-14 LAB — TSH: TSH: 2.72 mIU/L (ref 0.40–4.50)

## 2018-04-21 ENCOUNTER — Ambulatory Visit: Payer: Medicare Other | Admitting: Family Medicine

## 2018-04-30 ENCOUNTER — Ambulatory Visit: Payer: Medicare Other | Admitting: Cardiology

## 2018-05-19 ENCOUNTER — Telehealth: Payer: Self-pay | Admitting: *Deleted

## 2018-05-19 NOTE — Telephone Encounter (Signed)
CALLED PATIENT - LEFT MESSAGE - NEED MOVE APPOINTMENT due to dr harding not in the office- to 06/05/18 - at 1:20

## 2018-05-19 NOTE — Telephone Encounter (Signed)
PATIENT CALLED BACK,TLAKED TO OPERATOR = APPT MOVED

## 2018-05-26 ENCOUNTER — Other Ambulatory Visit: Payer: Self-pay | Admitting: Family Medicine

## 2018-05-28 ENCOUNTER — Ambulatory Visit: Payer: Medicare Other | Admitting: Cardiology

## 2018-06-03 ENCOUNTER — Telehealth: Payer: Self-pay | Admitting: Cardiology

## 2018-06-05 ENCOUNTER — Other Ambulatory Visit: Payer: Self-pay

## 2018-06-05 ENCOUNTER — Encounter: Payer: Self-pay | Admitting: Cardiology

## 2018-06-05 ENCOUNTER — Ambulatory Visit (INDEPENDENT_AMBULATORY_CARE_PROVIDER_SITE_OTHER): Payer: Medicare Other | Admitting: Cardiology

## 2018-06-05 ENCOUNTER — Other Ambulatory Visit: Payer: Self-pay | Admitting: *Deleted

## 2018-06-05 VITALS — BP 102/48 | HR 78 | Temp 99.1°F | Ht 65.0 in | Wt 142.0 lb

## 2018-06-05 DIAGNOSIS — I953 Hypotension of hemodialysis: Secondary | ICD-10-CM

## 2018-06-05 DIAGNOSIS — I200 Unstable angina: Secondary | ICD-10-CM

## 2018-06-05 DIAGNOSIS — I2510 Atherosclerotic heart disease of native coronary artery without angina pectoris: Secondary | ICD-10-CM

## 2018-06-05 DIAGNOSIS — I5032 Chronic diastolic (congestive) heart failure: Secondary | ICD-10-CM

## 2018-06-05 DIAGNOSIS — I25709 Atherosclerosis of coronary artery bypass graft(s), unspecified, with unspecified angina pectoris: Secondary | ICD-10-CM | POA: Diagnosis not present

## 2018-06-05 DIAGNOSIS — Z9861 Coronary angioplasty status: Secondary | ICD-10-CM

## 2018-06-05 DIAGNOSIS — Z01818 Encounter for other preprocedural examination: Secondary | ICD-10-CM

## 2018-06-05 DIAGNOSIS — E785 Hyperlipidemia, unspecified: Secondary | ICD-10-CM

## 2018-06-05 NOTE — Progress Notes (Signed)
PCP: Midge Minium, MD  Clinic Note: Chief Complaint  Patient presents with  . Follow-up    CAD, chronic angina  . Chest Pain    Now pretty much anginal symptoms at rest and with minimal exertion. -2 pill bottles this month  . Headache  . Shortness of Breath    HPI: [name] [name] is a 78 y.o. male with a PMH notable for multivessel CAD-CABG with multiple PCI-PTCA of the RCA for in-stent restenosis (along with ESRD on HD with hypotension related to a HD) who presents today for 3-4 month follow-up. -- Most recent event: 05/18/2016 non-STEMI --> cutting balloon PTCA of mRCA ISR Interestingly, most of his angina comes from his RCA as the remainder of his system seems to be stable.  Unfortunately, in order to allow his blood pressure to be higher, we have been forced to back down his antianginal medicines --> coincided with him having to take more nitroglycerin.  He noted that the midodrine really did not help him much, so he stopped --He is pretty much on max tolerated dose of beta-blocker plus Imdur.  Did not tolerate Pletal because of low blood pressure issues.  [name] [name] was last seen in January 2020.  Stated that he is probably using a little more nitroglycerin than he had been doing.  At least once a day --always with exertion, not at rest unless lying down at night (angina decubitus).  Any more than usual activity will lead to angina --> he noted that the symptoms are not yet to the point of needing to go to the Cath Lab.  Does note some PND orthopnea on predialysis days.  Dry weight was reduced to 64 kg but usually try to get up and 63.6 post HD.  2 near syncopal episodes after dialysis.  Often having to take a nap after dialysis.  Tolerating beta-blocker.    Recent Hospitalizations:  n/a  Studies Personally Reviewed - (if available, images/films reviewed: From Epic Chart or Care Everywhere)  No new studies  Interval History: [name] [name] returns today along with his wife  [name] [name], and is now up to the point where he says "he needs his cath ".  He is taking a significant amount of nitroglycerin, had to take a dose after walking out of the car to come to this visit.  He apparently is used 2 full bottles of nitroglycerin this month alone.  He is having most of his angina that will wake him up sometimes at 2 in the morning, but is also having it with any minimal exertion.  This is associated with dyspnea and almost nausea when it is bad.  Usually relieved somewhat with nitroglycerin, but has had to take as many as 2 tabs on a couple occasions.  He otherwise really is not having any heart failure symptoms of PND, orthopnea or edema.  Volume seems relatively stable with dialysis.  He still is having pretty low blood pressures and is only taking his metoprolol on Sunday Tuesday Thursday and Saturday.  He has been having a lot of lightheadedness and dizziness which is been better since he reduced the metoprolol dosing.  Pressures not been low enough to have any syncope or near syncopal type symptoms.  No TIA or amaurosis fugax symptoms.  He denies any rapid irregular heartbeats palpitations. No bleeding issues.   ROS: A comprehensive was performed. Pertinent symptoms noted in HPI> Review of Systems  Constitutional: Positive for chills, malaise/fatigue and weight loss (Loss of muscle  mass). Negative for fever.  HENT: Negative for congestion (.) and nosebleeds.   Respiratory: Negative for cough and wheezing.   Cardiovascular: Negative for claudication.       Per history of present illness  Gastrointestinal: Negative for abdominal pain and heartburn.  Musculoskeletal: Positive for joint pain. Negative for falls and myalgias.  Skin:       Has lost lots of hair, both on his head and elsewhere on his body.  Neurological: Positive for dizziness (Per HPI ) and weakness (Global). Negative for focal weakness and headaches.  Endo/Heme/Allergies: Bruises/bleeds easily.   Psychiatric/Behavioral: Positive for depression and memory loss. The patient is not nervous/anxious and does not have insomnia.   All other systems reviewed and are negative.   I have reviewed and (if needed) personally updated the patient's problem list, medications, allergies, past medical and surgical history, social and family history.   Past Medical History:  Diagnosis Date  . Anemia    Likely secondary to history of GI bleed  . Anginal pain (HCC)    Chronic stable  . Anxiety   . Arthritis   . BPH (benign prostatic hyperplasia)   . CAD (coronary artery disease) of bypass graft 2006, 2012   In 2006: Occluded SVG-OM noted (SVG-diagonal was previously occluded); 2012: Severe lesion in redo SVG-OM1 --> BMS PCI   . CAD in native artery; and the grafts 1992, 1997, 2002, 2006, 2012   CABG 1992 and redo CABG 2006  . CAD S/P percutaneous coronary angioplasty October 2012; Feb, Apr & Oct 2015; Feb 2016   a) s/p CABG 1992 and redo 2006. b) 10/12: NSTEMI: PCI to SVG-OM1 Integrity BMS 3.5 x 15 (3.85 mm) c) 2/15: UA - PCI mid RCA Xience Ap DES 2.75 x 15 (3.1 mm). d) 4/15 : UA - focal dRCA Resolute DES 2.25 x 8 (2.5 mm). e) 10/15: PCI to mRCA ISR w/ post stent lesion - Promus P 2.75 x 16 (3.25 mm). f) 2/16: NSTEMI: CBA/PTCA of  mRCA ISR (3.5 mm post-dilation); g) 7/16 ISR RCA->CBA/PTCA, residual 20%.  . Carotid arterial disease ([place])    a) Duplex 05/2012: R BULB/PROX ICA: 50-69%, L BULB/PROX ICA: 0-49%. ;; 04/29/2014: 29-52% RICA, 84-13% LICA. Patetn vertebrals,  . Chronic low back pain   . Colon cancer [facility]) 2003   colectomy for CA. no recurrence . never required  radiation or chem  . Diabetes mellitus without complication ([place])   . Dyspnea    "when I get too much Fluid"  . End stage renal disease on dialysis [facility])    Dialyzes at [facility]: Tues/Th/Sat - left upper cavity AV fistula brachiocephalic  . Gout   . Heart murmur   . History of hiatal hernia   . History of Non-ST elevated  myocardial infarction (non-STEMI) 1992, 2006, 2012; 02/2013  . Hyperlipidemia   . Hypertension   . Hypothyroidism    On supplementation  . Ischemic cardiomyopathy    a) EF 45-50% by echo 2015. b) EF 50% by cath 04/2014.  Marland Kitchen LBBB (left bundle branch block)    Chronic  . Peptic ulcer disease  2004  . Plavix resistance    a) P2Y12 264 in 02/2014 - put on Brilinta but did not tolerate due to SOB. b) cannot be on Effient due to history of stroke. c) Plavix increased to 75mg  BID and Pletal added 04/2014 due to ISR.  Marland Kitchen Pneumonia   . Skin cancer 02/1999   nose  . Stroke [facility]) 1998  Past Surgical History:  Procedure Laterality Date  . AORTIC ARCH ANGIOGRAPHY N/A 05/09/2016   Procedure: Aortic Arch Angiography;  Surgeon: Waynetta Sandy, MD;  Location: [place] CV LAB;  Service: Cardiovascular;  Laterality: N/A;  . APPENDECTOMY    . AV FISTULA REPAIR Left 05/2009  . CARDIAC CATHETERIZATION  10/16/2010   patent  LIMA to the LAD , PATENT  svg TO 2nd marginals ,occluded PLA with collaterals and SVG to the OM  had 90% stenosis  was txlge  bare - metal  3.5  Integrity ten  postdilate  36-37  mm    . CARDIAC CATHETERIZATION  03/22/2004   loss of both SVG,severe disease in prox and ostial  circ not amenable to invention. mod diease distal LAD  AFTER BYPASS GRAFT;-CVTS to evaluate   . CARDIAC CATHETERIZATION N/A 08/03/2014   Procedure: Left Heart Cath and Cors/Grafts Angiography;  Surgeon: Leonie Man, MD;  Location: [place] CV LAB;  Service: Cardiovascular;  Laterality: N/A;  . CARDIAC CATHETERIZATION N/A 08/03/2014   Procedure: Coronary Balloon Angioplasty;  Surgeon: Leonie Man, MD;  Location: [place] CV LAB;  Service: Cardiovascular;: Aggressive Cutting Balloon - [place] Balloon PTCA of ISR site in mRCA (3 stent layer)  . CAROTID DOPPLER  05/23/2012   ABN CAROTID-- RGT BULB/PROX ICA mild to mod 50-60%;lft bulb/prox mild to mod 0-49%;left subclavian abn waveforms consistent with  patients lft arm A/V fistula  . CATARACT EXTRACTION W/ INTRAOCULAR LENS  IMPLANT, BILATERAL Bilateral   . CHOLECYSTECTOMY  2009  . COLON RESECTION  2003   transverse and proximal descending w/ primar anastomosis  . COLON SURGERY  2003   "cancer"  . COLONOSCOPY    . CORONARY ANGIOPLASTY  04/03/1995   OM and Circ  . CORONARY ANGIOPLASTY  02/01/2000   CIRC  . CORONARY ANGIOPLASTY  02/09/14   Cutting Balloon PTCA of mRCA ISR 99% - 3.5 mm  . CORONARY ARTERY BYPASS GRAFT  08/22/1990   INITIAL:LIMA to LAD, SVG to  Diagonal, SVG to OM  . CORONARY ARTERY BYPASS GRAFT  2006   SVG to OM1,SVG to OM2 with patent LIMA  to LAD and occluded vein  graft to the diagonal  and occluded vein grft to OM from  prior  surgery  . CORONARY ATHERECTOMY  05/21/2016   Successful PTCA and cutting balloon atherectomy of mid dominant RCA "in-stent restenosis of the lesion that had been intervened on close to 2 years ago with an excellent result.   . CORONARY BALLOON ANGIOPLASTY N/A 05/21/2016   Procedure: Coronary Balloon Angioplasty;  Surgeon: Lorretta Harp, MD;  Location: [place] CV LAB;  Service: Cardiovascular:: Redo Cutting Balloon angioplasty RCA ISR  . DISTAL REVASCULARIZATION AND INTERVAL LIGATION (DRIL) Left 05/14/2016   Procedure: DISTAL REVASCULARIZATION AND INTERVAL LIGATION (DRIL) LEFT ARM;  Surgeon: Waynetta Sandy, MD;  Location: [place];  Service: Vascular;  Laterality: Left;  . DOPPLER ECHOCARDIOGRAPHY  01/25/2012   EF 50 to 55%  . EVENT MONITOR  01/23/2012-02/06/2012   SINUS ,LBBB,unifocal PVCs  . LEFT AND RIGHT HEART CATHETERIZATION WITH CORONARY/GRAFT ANGIOGRAM N/A 04/20/2014   Procedure: LEFT AND RIGHT HEART CATHETERIZATION WITH Beatrix Fetters;  Surgeon: Sherren Mocha, MD;  Location: [facility] CATH LAB;  Service: Cardiovascular;  Laterality: N/A;  . LEFT HEART CATH AND CORS/GRAFTS ANGIOGRAPHY N/A 05/21/2016   Procedure: LEFT HEART CATH AND CORS/GRAFTS ANGIOGRAPHY;  Surgeon: Lorretta Harp, MD;  Location: [place] CV LAB;  Service: Cardiovascular;; 95% in-stent restenosis  of mid RCA treated with Cutting Balloon PTCA.  6% ramus.  Patent LIMA-LAD and SVG-OM.  Distal LAD and OM 2 -75% after graft insertion  . LEFT HEART CATHETERIZATION WITH CORONARY ANGIOGRAM N/A 02/23/2013   Procedure: LEFT HEART CATHETERIZATION WITH CORONARY ANGIOGRAM;  Surgeon: Lorretta Harp, MD;  Location: [facility] CATH LAB;  Service: Cardiovascular;  Laterality: N/A;  . LEFT HEART CATHETERIZATION WITH CORONARY/GRAFT ANGIOGRAM N/A 04/30/2013   Procedure: LEFT HEART CATHETERIZATION WITH Beatrix Fetters;  Surgeon: Leonie Man, MD;  Location: [facility] CATH LAB;  Service: Cardiovascular;  Laterality: N/A;  . LEFT HEART CATHETERIZATION WITH CORONARY/GRAFT ANGIOGRAM N/A 10/15/2013   Procedure: LEFT HEART CATHETERIZATION WITH Beatrix Fetters;  Surgeon: Leonie Man, MD;  Location: [facility] CATH LAB;  Service: Cardiovascular;  Laterality: N/A;  . LEFT HEART CATHETERIZATION WITH CORONARY/GRAFT ANGIOGRAM N/A 02/09/2014   Procedure: LEFT HEART CATHETERIZATION WITH Beatrix Fetters;  Surgeon: Leonie Man, MD;  Location: [facility] CATH LAB;  Service: Cardiovascular;  Laterality: N/A;  . Lower Extremity Arterial Dopplers  October 2014   Calcified but non-occlusive peripheral arteries. No evidence of stenosis  . NM MYOCAR PERF WALL MOTION  10/12/2011   EF 54%,LV normal ; no signifiant ischemia  . PERCUTANEOUS CORONARY STENT INTERVENTION (PCI-S)  02/23/2013   mid RCA 80% & 70% - Xience 2.75 mm x 15 mm ( 3.89mm) ; LIMA-LAD patent, SVG-OM patent (stent patent); SVG-Diag patent.  Marland Kitchen PERCUTANEOUS CORONARY STENT INTERVENTION (PCI-S)  April 2015   dRCA - Integrity Resolute DES   2.25 mm x 56mm (2.5 mm)  . PERCUTANEOUS CORONARY STENT INTERVENTION (PCI-S)  Oct 15 2013   [place] Angina: mRCA ISR with post-stent stenosis -- Cuting PTCA & PCI Promus Premier DES 2.75 mm x 16 mm (3.25 mm)  . POSTERIOR LUMBAR FUSION    .  REVISON OF ARTERIOVENOUS FISTULA Left 09/73/5329   Procedure: PLICATION OF LEFT UPPER ARM  ARTERIOVENOUS FISTULA  ANEURYSM;  Surgeon: Rosetta Posner, MD;  Location: [place];  Service: Vascular;  Laterality: Left;  . TRANSTHORACIC ECHOCARDIOGRAM  02/23/2013   Moderate concentric hypertrophy. EF 45-50%. Septal bounce. Grade 2 diastolic dysfunction (pseudo-normal - severely elevated filling pressures) mild to moderate MR and moderate LA dilation to  . UPPER EXTREMITY ANGIOGRAPHY Left 05/09/2016   Procedure: Upper Extremity Angiography;  Surgeon: Waynetta Sandy, MD;  Location: [place] CV LAB;  Service: Cardiovascular;  Laterality: Left;    Current Meds  Medication Sig  . acetaminophen (TYLENOL) 500 MG tablet Take 1,000 mg by mouth 2 (two) times daily as needed for mild pain.   Marland Kitchen atorvastatin (LIPITOR) 40 MG tablet Take 1 tablet (40 mg total) by mouth daily.  Marland Kitchen Besifloxacin HCl (BESIVANCE) 0.6 % SUSP Place 1 drop into both eyes See admin instructions. Use eye drops 2 times daily for 2 days following injection by Dr. Zigmund Daniel (every 10 weeks)  . cinacalcet (SENSIPAR) 30 MG tablet Take 30 mg by mouth daily.  . clonazePAM (KLONOPIN) 0.5 MG tablet Take 1 tablet (0.5 mg total) by mouth 2 (two) times daily as needed. for anxiety  . clopidogrel (PLAVIX) 75 MG tablet Take 1 tablet (75 mg total) by mouth daily.  . ferric citrate (AURYXIA) 1 GM 210 MG(Fe) tablet Take 420 mg by mouth 3 (three) times daily with meals.  Marland Kitchen glucose blood (TRUE METRIX BLOOD GLUCOSE TEST) test strip 1 each by Other route as needed for other. Use as instructed to test sugars 2-3 times daily. Dx. E11.40  . isosorbide mononitrate (  IMDUR) 120 MG 24 hr tablet TAKE 1 TABLET BY MOUTH ONCE DAILY  . levothyroxine (SYNTHROID) 125 MCG tablet TAKE 1 TABLET BY MOUTH ONCE DAILY BEFORE BREAKFAST  . lidocaine-prilocaine (EMLA) cream Apply 1 application topically See admin instructions. Apply prior to dialysis treatments on Tuesday, Thursday  and Saturday  . MELATONIN PO Take 1 tablet by mouth at bedtime as needed (sleep).  . metoprolol succinate (TOPROL-XL) 25 MG 24 hr tablet Take 1 tablet (25 mg total) by mouth daily. (Patient taking differently: Take 25 mg by mouth daily. Patient Taking on: Sunday, Tuesday, Thursday, Saturday)  . nitroGLYCERIN (NITROSTAT) 0.4 MG SL tablet DISSOLVE ONE TABLET UNDER THE TONGUE EVERY 5 MINUTES AS NEEDED FOR CHEST PAIN. UP TO 3 TIMES AN EPISODE  . nortriptyline (PAMELOR) 25 MG capsule Take 1 capsule (25 mg total) by mouth at bedtime.  Marland Kitchen nystatin (MYCOSTATIN) 100000 UNIT/ML suspension Take 5 mLs by mouth 4 (four) times daily.  - still has issues with Thrush   Allergies  Allergen Reactions  . Brilinta [Ticagrelor] Shortness Of Breath  . Shellfish Allergy Other (See Comments)    Causes gout flare-ups   Social History   Tobacco Use  . Smoking status: Never Smoker  . Smokeless tobacco: Never Used  Substance Use Topics  . Alcohol use: No  . Drug use: No    Social History   Social History Narrative   Long-term patient of Dr. Rollene Fare.   Married father of 55, grandfather 41.   Never smoked.   Not exercising D2 hip and back pain & now Angina    family history includes Diabetes in his father and mother; Heart attack in his father and mother; Heart disease in his father and mother; Hypertension in his father and mother; Stroke in his paternal aunt and paternal uncle.  Wt Readings from Last 3 Encounters:  06/05/18 142 lb (64.4 kg)  04/09/18 140 lb (63.5 kg)  01/29/18 143 lb 3.2 oz (65 kg)    PHYSICAL EXAM BP (!) 102/48 (BP Location: Right Arm, Patient Position: Sitting, Cuff Size: Normal)   Pulse 78   Temp 99.1 F (37.3 C)   Ht 5\' 5"  (1.651 m)   Wt 142 lb (64.4 kg)   BMI 23.63 kg/m   Physical Exam  Constitutional: He is oriented to person, place, and time. No distress.  Chronically ill almost gaunt appearing gentleman.  He appears tired but nontoxic.  No acute distress.  HENT:   Head: Normocephalic and atraumatic.  Has essentially lost all of his hair. Poor dentition  Eyes: Pupils are equal, round, and reactive to light. EOM are normal.  Neck: Normal range of motion. Neck supple. No hepatojugular reflux and no JVD present. Carotid bruit is not present (More consistent with left subclavian bruit fromm his fistula).  Cardiovascular: Normal rate, regular rhythm, S1 normal and S2 normal.  No extrasystoles are present. PMI is not displaced. Exam reveals distant heart sounds and decreased pulses (L wrist (but palpable) - mildy reduced pedal pulses). Exam reveals no gallop, no S4 and no friction rub.  No murmur (can hear a holosystolic bruit throughout the precordium that is most likely related to his fistula) heard. Can hear bruit of fistula-left upper arm  Pulmonary/Chest: Effort normal and breath sounds normal. No respiratory distress. He has no wheezes. He has no rales.  Abdominal: Soft. Bowel sounds are normal. He exhibits no distension. There is no abdominal tenderness. There is no rebound.  No HSM  Musculoskeletal: Normal range of motion.  General: No edema.  Neurological: He is alert and oriented to person, place, and time.  Skin: Skin is warm and dry. There is pallor.  Diffuse bruising on hands & forearms.  Psychiatric: He has a normal mood and affect. His behavior is normal. Judgment and thought content normal.  Nursing note and vitals reviewed.   Adult ECG Report  Rate: 78;  Rhythm: normal sinus rhythm;  LBBB  Narrative Interpretat:  STABLE. Normal axis intervals & durations   Other studies Reviewed: Additional studies/ records that were reviewed today include:  Recent Labs:   Lab Results  Component Value Date   CHOL 145 04/11/2018   HDL 24.10 (L) 04/11/2018   LDLCALC 84 04/11/2018   LDLDIRECT 154.0 04/22/2017   TRIG 184.0 (H) 04/11/2018   CHOLHDL 6 04/11/2018    ASSESSMENT / PLAN: Problem List Items Addressed This Visit    CAD (coronary  artery disease) of bypass graft - PCI to SVG-OM with BMS; PCI-mid RCA with a Xience DES (Chronic)   Relevant Orders   EKG 12-Lead   CBC   Basic metabolic panel   CAD S/P percutaneous coronary angioplasty (Chronic)   Relevant Orders   EKG 12-Lead   CBC   Basic metabolic panel   Chronic diastolic congestive heart failure, NYHA class 2 (HCC) (Chronic)   Hyperlipidemia with target LDL less than 70 (Chronic)   Hypotension of hemodialysis (Chronic)   Progressive angina ([place]) - Primary    At max possible antianginal regimen for his blood pressure tolerance. No longer able to treat without invasive management.   Plan: Left heart catheterization with coronary and graft angiography and possible PCI.    Plan for June 5.  He will need precath COVID-19 testing  Continue Plavix, statin, Imdur and Toprol.       Relevant Orders   EKG 12-Lead   CBC   Basic metabolic panel    Other Visit Diagnoses    Pre-op testing       Relevant Orders   EKG 12-Lead   CBC   Basic metabolic panel     [name] [name] is now having pretty much unstable/progressive angina symptoms.  He is progress from his chronic stable angina that is been well controlled on low-dose Toprol and high-dose Imdur.  He is pretty much on max tolerated antianginals.  Blood pressure would not tolerate amlodipine and Ranexa is essentially contraindicated with dialysis patients.  At this point, I think we just need to simply plan relook catheterization with possible PCI.  We always have to be concerned about the possibility of other graft disease as opposed to only his RCA.  Most recent lipids show that his LDL is 84 which is a little bit up from last year 65.  Would simply continue current dose of atorvastatin at this point.  Unfortunately he is still having hypertension issues.  He did not seem to be all that interested in using midodrine and again because it did not really seem to be all that effective in the past despite high doses.  The  still possibility that he may need to go to a 4 time a week dialysis schedule.  Plan: Left heart catheterization with coronary and graft angiography and possible PCI.    Plan for June 5.  He will need precath COVID-19 testing  Continue Plavix, statin, Imdur and Toprol.    Performing MD:  [name], M.D., M.S.  Procedure: LEFT HEART CATHETERIZATION WITH NATIVE CORONARY AND GRAFT ANGIOGRAPHY with possible PERCUTANEOUS CORONARY INTERVENTION  The procedure with Risks/Benefits/Alternatives and Indications was reviewed with the patient and his wife [name] [name].  All questions were answered.    Risks / Complications include, but not limited to: Death, MI, CVA/TIA, VF/VT (with defibrillation), Bradycardia (need for temporary pacer placement), bleeding / bruising / hematoma / pseudoaneurysm, radiation-related injury in the case of prolonged fluoroscopy use, vascular or coronary injury (with possible emergent CT or Vascular Surgery), adverse medication reactions, infection.    The patient understands the risks of serious complication is 1-2 in 6948 with diagnostic cardiac cath and 1-2% or less with angioplasty/stenting.   The patient (and wife) voice understanding and agree to proceed.   I have signed the consent form and placed it on the chart for patient signature and RN witness.     As is usually the case, I spent at least 25+ minutes with the patient discussing his condition and progression as well as potential medical options.  50% is spent in direct patient counseling.  Current medicines are reviewed at length with the patient today. (+/- concerns) n/a The following changes have been made: n/a  Patient Instructions  Medication Instructions:   Your physician recommends that you continue on your current medications as directed. Please refer to the Current Medication list given to you today.  If you need a refill on your cardiac medications before your next appointment, please call your  pharmacy.   Lab work: You will need to have labs (blood work) drawn for your Cath procedure:  BMET CBC -RAPID COVID TEST AT 1 PM ON June 4 ,2020 - GO TO [place]  ( [address])  If you have labs (blood work) drawn today and your tests are completely normal, you will receive your results only by: Marland Kitchen MyChart Message (if you have MyChart) OR . A paper copy in the mail If you have any lab test that is abnormal or we need to change your treatment, we will call you to review the results.  Testing/Procedures:  Your physician has requested that you have a cardiac catheterization. Cardiac catheterization is used to diagnose and/or treat various heart conditions. Doctors may recommend this procedure for a number of different reasons. The most common reason is to evaluate chest pain. Chest pain can be a symptom of coronary artery disease (CAD), and cardiac catheterization can show whether plaque is narrowing or blocking your heart's arteries. This procedure is also used to evaluate the valves, as well as measure the blood flow and oxygen levels in different parts of your heart. For further information please visit [email or web address]. Please follow instruction sheet, as given.   Follow-Up: At [facility], you and your health needs are our priority.  As part of our continuing mission to provide you with exceptional heart care, we have created designated Provider Care Teams.  These Care Teams include your primary Cardiologist (physician) and Advanced Practice Providers (APPs -  Physician Assistants and Nurse Practitioners) who all work together to provide you with the care you need, when you need it. You will need a follow up appointment in 1 month.  Please call our office 2 months in advance to schedule this appointment.  You may see [name], MD or one of the following Advanced Practice Providers on your designated Care Team:   Rosaria Ferries, PA-C  . Jory Sims, DNP, ANP  Any Other Special Instructions Will Be Listed Below (If Applicable).       [place]  GROUP HEARTCARE CARDIOVASCULAR DIVISION [facility] [address] [place] [address] Dept: [phone] Loc: [phone]  WILBERN PENNYPACKER  06/05/2018  You are scheduled for a Cardiac Catheterization on Friday, June 5 with Dr. [name].  1. Please arrive at the [facility] (Main Entrance A) at [facility]: [address] [place], [place] [address] at 5:30 AM (This time is two hours before your procedure to ensure your preparation). Free valet parking service is available.   Special note: Every effort is made to have your procedure done on time. Please understand that emergencies sometimes delay scheduled procedures.  2. Diet: Do not eat solid foods after midnight.  The patient may have clear liquids until 5am upon the day of the procedure.  3. Labs:DO LABS TODAY - BMP,CBC--  June 4,2020-- COVID TEST  - [facility]  1 PM  4. Medication instructions in preparation for your procedure:  On the morning of your procedure, take your Plavix/Clopidogrel and any morning medicines NOT listed above.  You may use sips of water.  5. Plan for one night stay--bring personal belongings. 6. Bring a current list of your medications and current insurance cards. 7. You MUST have a responsible person to drive you home. 8. Someone MUST be with you the first 24 hours after you arrive home or your discharge will be delayed. 9. Please wear clothes that are easy to get on and off and wear slip-on shoes.  Thank you for allowing [place] to care for you!   -- [facility]   Studies Ordered:   Orders Placed This Encounter  Procedures  . CBC  . Basic metabolic panel  . EKG 12-Lead      [name], M.D., M.S. Interventional Cardiologist   Pager # [phone] Phone #  [phone] [address] [place], [place] [address]

## 2018-06-05 NOTE — Assessment & Plan Note (Signed)
At max possible antianginal regimen for his blood pressure tolerance. No longer able to treat without invasive management.   Plan: Left heart catheterization with coronary and graft angiography and possible PCI.    Plan for June 5.  He will need precath COVID-19 testing  Continue Plavix, statin, Imdur and Toprol.

## 2018-06-05 NOTE — Patient Instructions (Addendum)
Medication Instructions:   Your physician recommends that you continue on your current medications as directed. Please refer to the Current Medication list given to you today.  If you need a refill on your cardiac medications before your next appointment, please call your pharmacy.   Lab work: You will need to have labs (blood work) drawn for your Cath procedure:  BMET CBC -RAPID COVID TEST AT 1 PM ON June 4 ,2020 - GO TO [facility]  ( [place])  If you have labs (blood work) drawn today and your tests are completely normal, you will receive your results only by: Marland Kitchen MyChart Message (if you have MyChart) OR . A paper copy in the mail If you have any lab test that is abnormal or we need to change your treatment, we will call you to review the results.  Testing/Procedures:  Your physician has requested that you have a cardiac catheterization. Cardiac catheterization is used to diagnose and/or treat various heart conditions. Doctors may recommend this procedure for a number of different reasons. The most common reason is to evaluate chest pain. Chest pain can be a symptom of coronary artery disease (CAD), and cardiac catheterization can show whether plaque is narrowing or blocking your heart's arteries. This procedure is also used to evaluate the valves, as well as measure the blood flow and oxygen levels in different parts of your heart. For further information please visit [email or web address]. Please follow instruction sheet, as given.   Follow-Up: At [facility], you and your health needs are our priority.  As part of our continuing mission to provide you with exceptional heart care, we have created designated Provider Care Teams.  These Care Teams include your primary Cardiologist (physician) and Advanced Practice Providers (APPs -  Physician Assistants and Nurse Practitioners) who all work together to provide you with the care you need, when  you need it. You will need a follow up appointment in 1 month.  Please call our office 2 months in advance to schedule this appointment.  You may see [name], MD or one of the following Advanced Practice Providers on your designated Care Team:   Rosaria Ferries, PA-C . Jory Sims, DNP, ANP  Any Other Special Instructions Will Be Listed Below (If Applicable).       [address] [place] [address] Dept: [phone] Loc: [phone]  [name] [name]  06/05/2018  You are scheduled for a Cardiac Catheterization on Friday, June 5 with Dr. [name].  1. Please arrive at the [facility] (Main Entrance A) at [facility]: [address] [place], [place] [address] at 5:30 AM (This time is two hours before your procedure to ensure your preparation). Free valet parking service is available.   Special note: Every effort is made to have your procedure done on time. Please understand that emergencies sometimes delay scheduled procedures.  2. Diet: Do not eat solid foods after midnight.  The patient may have clear liquids until 5am upon the day of the procedure.  3. Labs:DO LABS TODAY - BMP,CBC--  June 4,2020-- COVID TEST  -  [facility]  1 PM  4. Medication instructions in preparation for your procedure:  On the morning of your procedure, take your Plavix/Clopidogrel and any morning medicines NOT listed above.  You may use sips of water.  5. Plan for one night stay--bring  personal belongings. 6. Bring a current list of your medications and current insurance cards. 7. You MUST have a responsible person to drive you home. 8. Someone MUST be with you the first 24 hours after you arrive home or your discharge will be delayed. 9. Please wear clothes that are easy to get on and off and wear slip-on shoes.  Thank you for allowing [place] to care for  you!   -- [facility]

## 2018-06-05 NOTE — H&P (View-Only) (Signed)
PCP: Midge Minium, MD  Clinic Note: Chief Complaint  Patient presents with  . Follow-up    CAD, chronic angina  . Chest Pain    Now pretty much anginal symptoms at rest and with minimal exertion. -2 pill bottles this month  . Headache  . Shortness of Breath    HPI: [name] [name] is a 78 y.o. male with a PMH notable for multivessel CAD-CABG with multiple PCI-PTCA of the RCA for in-stent restenosis (along with ESRD on HD with hypotension related to a HD) who presents today for 3-4 month follow-up. -- Most recent event: 05/18/2016 non-STEMI --> cutting balloon PTCA of mRCA ISR Interestingly, most of his angina comes from his RCA as the remainder of his system seems to be stable.  Unfortunately, in order to allow his blood pressure to be higher, we have been forced to back down his antianginal medicines --> coincided with him having to take more nitroglycerin.  He noted that the midodrine really did not help him much, so he stopped --He is pretty much on max tolerated dose of beta-blocker plus Imdur.  Did not tolerate Pletal because of low blood pressure issues.  [name] was last seen in January 2020.  Stated that he is probably using a little more nitroglycerin than he had been doing.  At least once a day --always with exertion, not at rest unless lying down at night (angina decubitus).  Any more than usual activity will lead to angina --> he noted that the symptoms are not yet to the point of needing to go to the Cath Lab.  Does note some PND orthopnea on predialysis days.  Dry weight was reduced to 64 kg but usually try to get up and 63.6 post HD.  2 near syncopal episodes after dialysis.  Often having to take a nap after dialysis.  Tolerating beta-blocker.    Recent Hospitalizations:  n/a  Studies Personally Reviewed - (if available, images/films reviewed: From Epic Chart or Care Everywhere)  No new studies  Interval History: [name] [name] returns today along with his wife  [name] [name], and is now up to the point where he says "he needs his cath ".  He is taking a significant amount of nitroglycerin, had to take a dose after walking out of the car to come to this visit.  He apparently is used 2 full bottles of nitroglycerin this month alone.  He is having most of his angina that will wake him up sometimes at 2 in the morning, but is also having it with any minimal exertion.  This is associated with dyspnea and almost nausea when it is bad.  Usually relieved somewhat with nitroglycerin, but has had to take as many as 2 tabs on a couple occasions.  He otherwise really is not having any heart failure symptoms of PND, orthopnea or edema.  Volume seems relatively stable with dialysis.  He still is having pretty low blood pressures and is only taking his metoprolol on Sunday Tuesday Thursday and Saturday.  He has been having a lot of lightheadedness and dizziness which is been better since he reduced the metoprolol dosing.  Pressures not been low enough to have any syncope or near syncopal type symptoms.  No TIA or amaurosis fugax symptoms.  He denies any rapid irregular heartbeats palpitations. No bleeding issues.   ROS: A comprehensive was performed. Pertinent symptoms noted in HPI> Review of Systems  Constitutional: Positive for chills, malaise/fatigue and weight loss (Loss of muscle  mass). Negative for fever.  HENT: Negative for congestion (.) and nosebleeds.   Respiratory: Negative for cough and wheezing.   Cardiovascular: Negative for claudication.       Per history of present illness  Gastrointestinal: Negative for abdominal pain and heartburn.  Musculoskeletal: Positive for joint pain. Negative for falls and myalgias.  Skin:       Has lost lots of hair, both on his head and elsewhere on his body.  Neurological: Positive for dizziness (Per HPI ) and weakness (Global). Negative for focal weakness and headaches.  Endo/Heme/Allergies: Bruises/bleeds easily.   Psychiatric/Behavioral: Positive for depression and memory loss. The patient is not nervous/anxious and does not have insomnia.   All other systems reviewed and are negative.   I have reviewed and (if needed) personally updated the patient's problem list, medications, allergies, past medical and surgical history, social and family history.   Past Medical History:  Diagnosis Date  . Anemia    Likely secondary to history of GI bleed  . Anginal pain (HCC)    Chronic stable  . Anxiety   . Arthritis   . BPH (benign prostatic hyperplasia)   . CAD (coronary artery disease) of bypass graft 2006, 2012   In 2006: Occluded SVG-OM noted (SVG-diagonal was previously occluded); 2012: Severe lesion in redo SVG-OM1 --> BMS PCI   . CAD in native artery; and the grafts 1992, 1997, 2002, 2006, 2012   CABG 1992 and redo CABG 2006  . CAD S/P percutaneous coronary angioplasty October 2012; Feb, Apr & Oct 2015; Feb 2016   a) s/p CABG 1992 and redo 2006. b) 10/12: NSTEMI: PCI to SVG-OM1 Integrity BMS 3.5 x 15 (3.85 mm) c) 2/15: UA - PCI mid RCA Xience Ap DES 2.75 x 15 (3.1 mm). d) 4/15 : UA - focal dRCA Resolute DES 2.25 x 8 (2.5 mm). e) 10/15: PCI to mRCA ISR w/ post stent lesion - Promus P 2.75 x 16 (3.25 mm). f) 2/16: NSTEMI: CBA/PTCA of  mRCA ISR (3.5 mm post-dilation); g) 7/16 ISR RCA->CBA/PTCA, residual 20%.  . Carotid arterial disease ([place])    a) Duplex 05/2012: R BULB/PROX ICA: 50-69%, L BULB/PROX ICA: 0-49%. ;; 04/29/2014: 27-03% RICA, 50-09% LICA. Patetn vertebrals,  . Chronic low back pain   . Colon cancer [facility]) 2003   colectomy for CA. no recurrence . never required  radiation or chem  . Diabetes mellitus without complication ([place])   . Dyspnea    "when I get too much Fluid"  . End stage renal disease on dialysis [facility])    Dialyzes at [facility]: Tues/Th/Sat - left upper cavity AV fistula brachiocephalic  . Gout   . Heart murmur   . History of hiatal hernia   . History of Non-ST elevated  myocardial infarction (non-STEMI) 1992, 2006, 2012; 02/2013  . Hyperlipidemia   . Hypertension   . Hypothyroidism    On supplementation  . Ischemic cardiomyopathy    a) EF 45-50% by echo 2015. b) EF 50% by cath 04/2014.  Marland Kitchen LBBB (left bundle branch block)    Chronic  . Peptic ulcer disease  2004  . Plavix resistance    a) P2Y12 264 in 02/2014 - put on Brilinta but did not tolerate due to SOB. b) cannot be on Effient due to history of stroke. c) Plavix increased to 75mg  BID and Pletal added 04/2014 due to ISR.  Marland Kitchen Pneumonia   . Skin cancer 02/1999   nose  . Stroke [facility]) 1998  Past Surgical History:  Procedure Laterality Date  . AORTIC ARCH ANGIOGRAPHY N/A 05/09/2016   Procedure: Aortic Arch Angiography;  Surgeon: Waynetta Sandy, MD;  Location: [place] CV LAB;  Service: Cardiovascular;  Laterality: N/A;  . APPENDECTOMY    . AV FISTULA REPAIR Left 05/2009  . CARDIAC CATHETERIZATION  10/16/2010   patent  LIMA to the LAD , PATENT  svg TO 2nd marginals ,occluded PLA with collaterals and SVG to the OM  had 90% stenosis  was txlge  bare - metal  3.5  Integrity ten  postdilate  36-37  mm    . CARDIAC CATHETERIZATION  03/22/2004   loss of both SVG,severe disease in prox and ostial  circ not amenable to invention. mod diease distal LAD  AFTER BYPASS GRAFT;-CVTS to evaluate   . CARDIAC CATHETERIZATION N/A 08/03/2014   Procedure: Left Heart Cath and Cors/Grafts Angiography;  Surgeon: Leonie Man, MD;  Location: [place] CV LAB;  Service: Cardiovascular;  Laterality: N/A;  . CARDIAC CATHETERIZATION N/A 08/03/2014   Procedure: Coronary Balloon Angioplasty;  Surgeon: Leonie Man, MD;  Location: [place] [facility];  Service: Cardiovascular;: Aggressive Cutting Balloon - [place] Balloon PTCA of ISR site in mRCA (3 stent layer)  . CAROTID DOPPLER  05/23/2012   ABN CAROTID-- RGT BULB/PROX ICA mild to mod 50-60%;lft bulb/prox mild to mod 0-49%;left subclavian abn waveforms consistent with  patients lft arm A/V fistula  . CATARACT EXTRACTION W/ INTRAOCULAR LENS  IMPLANT, BILATERAL Bilateral   . CHOLECYSTECTOMY  2009  . COLON RESECTION  2003   transverse and proximal descending w/ primar anastomosis  . COLON SURGERY  2003   "cancer"  . COLONOSCOPY    . CORONARY ANGIOPLASTY  04/03/1995   OM and Circ  . CORONARY ANGIOPLASTY  02/01/2000   CIRC  . CORONARY ANGIOPLASTY  02/09/14   Cutting Balloon PTCA of mRCA ISR 99% - 3.5 mm  . CORONARY ARTERY BYPASS GRAFT  08/22/1990   INITIAL:LIMA to LAD, SVG to  Diagonal, SVG to OM  . CORONARY ARTERY BYPASS GRAFT  2006   SVG to OM1,SVG to OM2 with patent LIMA  to LAD and occluded vein  graft to the diagonal  and occluded vein grft to OM from  prior  surgery  . CORONARY ATHERECTOMY  05/21/2016   Successful PTCA and cutting balloon atherectomy of mid dominant RCA "in-stent restenosis of the lesion that had been intervened on close to 2 years ago with an excellent result.   . CORONARY BALLOON ANGIOPLASTY N/A 05/21/2016   Procedure: Coronary Balloon Angioplasty;  Surgeon: Lorretta Harp, MD;  Location: [place] CV LAB;  Service: Cardiovascular:: Redo Cutting Balloon angioplasty RCA ISR  . DISTAL REVASCULARIZATION AND INTERVAL LIGATION (DRIL) Left 05/14/2016   Procedure: DISTAL REVASCULARIZATION AND INTERVAL LIGATION (DRIL) LEFT ARM;  Surgeon: Waynetta Sandy, MD;  Location: [place];  Service: Vascular;  Laterality: Left;  . DOPPLER ECHOCARDIOGRAPHY  01/25/2012   EF 50 to 55%  . EVENT MONITOR  01/23/2012-02/06/2012   SINUS ,LBBB,unifocal PVCs  . LEFT AND RIGHT HEART CATHETERIZATION WITH CORONARY/GRAFT ANGIOGRAM N/A 04/20/2014   Procedure: LEFT AND RIGHT HEART CATHETERIZATION WITH Beatrix Fetters;  Surgeon: Sherren Mocha, MD;  Location: [facility] CATH LAB;  Service: Cardiovascular;  Laterality: N/A;  . LEFT HEART CATH AND CORS/GRAFTS ANGIOGRAPHY N/A 05/21/2016   Procedure: LEFT HEART CATH AND CORS/GRAFTS ANGIOGRAPHY;  Surgeon: Lorretta Harp, MD;  Location: [place] CV LAB;  Service: Cardiovascular;; 95% in-stent restenosis  of mid RCA treated with Cutting Balloon PTCA.  6% ramus.  Patent LIMA-LAD and SVG-OM.  Distal LAD and OM 2 -75% after graft insertion  . LEFT HEART CATHETERIZATION WITH CORONARY ANGIOGRAM N/A 02/23/2013   Procedure: LEFT HEART CATHETERIZATION WITH CORONARY ANGIOGRAM;  Surgeon: Lorretta Harp, MD;  Location: [facility] CATH LAB;  Service: Cardiovascular;  Laterality: N/A;  . LEFT HEART CATHETERIZATION WITH CORONARY/GRAFT ANGIOGRAM N/A 04/30/2013   Procedure: LEFT HEART CATHETERIZATION WITH Beatrix Fetters;  Surgeon: Leonie Man, MD;  Location: [facility] CATH LAB;  Service: Cardiovascular;  Laterality: N/A;  . LEFT HEART CATHETERIZATION WITH CORONARY/GRAFT ANGIOGRAM N/A 10/15/2013   Procedure: LEFT HEART CATHETERIZATION WITH Beatrix Fetters;  Surgeon: Leonie Man, MD;  Location: [facility] CATH LAB;  Service: Cardiovascular;  Laterality: N/A;  . LEFT HEART CATHETERIZATION WITH CORONARY/GRAFT ANGIOGRAM N/A 02/09/2014   Procedure: LEFT HEART CATHETERIZATION WITH Beatrix Fetters;  Surgeon: Leonie Man, MD;  Location: [facility] CATH LAB;  Service: Cardiovascular;  Laterality: N/A;  . Lower Extremity Arterial Dopplers  October 2014   Calcified but non-occlusive peripheral arteries. No evidence of stenosis  . NM MYOCAR PERF WALL MOTION  10/12/2011   EF 54%,LV normal ; no signifiant ischemia  . PERCUTANEOUS CORONARY STENT INTERVENTION (PCI-S)  02/23/2013   mid RCA 80% & 70% - Xience 2.75 mm x 15 mm ( 3.44mm) ; LIMA-LAD patent, SVG-OM patent (stent patent); SVG-Diag patent.  Marland Kitchen PERCUTANEOUS CORONARY STENT INTERVENTION (PCI-S)  April 2015   dRCA - Integrity Resolute DES   2.25 mm x 31mm (2.5 mm)  . PERCUTANEOUS CORONARY STENT INTERVENTION (PCI-S)  Oct 15 2013   [place] Angina: mRCA ISR with post-stent stenosis -- C[place] PTCA & PCI Promus Premier DES 2.75 mm x 16 mm (3.25 mm)  . POSTERIOR LUMBAR FUSION    .  REVISON OF ARTERIOVENOUS FISTULA Left 47/82/9562   Procedure: PLICATION OF LEFT UPPER ARM  ARTERIOVENOUS FISTULA  ANEURYSM;  Surgeon: Rosetta Posner, MD;  Location: [place];  Service: Vascular;  Laterality: Left;  . TRANSTHORACIC ECHOCARDIOGRAM  02/23/2013   Moderate concentric hypertrophy. EF 45-50%. Septal bounce. Grade 2 diastolic dysfunction (pseudo-normal - severely elevated filling pressures) mild to moderate MR and moderate LA dilation to  . UPPER EXTREMITY ANGIOGRAPHY Left 05/09/2016   Procedure: Upper Extremity Angiography;  Surgeon: Waynetta Sandy, MD;  Location: [place] CV LAB;  Service: Cardiovascular;  Laterality: Left;    Current Meds  Medication Sig  . acetaminophen (TYLENOL) 500 MG tablet Take 1,000 mg by mouth 2 (two) times daily as needed for mild pain.   Marland Kitchen atorvastatin (LIPITOR) 40 MG tablet Take 1 tablet (40 mg total) by mouth daily.  Marland Kitchen Besifloxacin HCl (BESIVANCE) 0.6 % SUSP Place 1 drop into both eyes See admin instructions. Use eye drops 2 times daily for 2 days following injection by Dr. Zigmund Daniel (every 10 weeks)  . cinacalcet (SENSIPAR) 30 MG tablet Take 30 mg by mouth daily.  . clonazePAM (KLONOPIN) 0.5 MG tablet Take 1 tablet (0.5 mg total) by mouth 2 (two) times daily as needed. for anxiety  . clopidogrel (PLAVIX) 75 MG tablet Take 1 tablet (75 mg total) by mouth daily.  . ferric citrate (AURYXIA) 1 GM 210 MG(Fe) tablet Take 420 mg by mouth 3 (three) times daily with meals.  Marland Kitchen glucose blood (TRUE METRIX BLOOD GLUCOSE TEST) test strip 1 each by Other route as needed for other. Use as instructed to test sugars 2-3 times daily. Dx. E11.40  . isosorbide mononitrate (  IMDUR) 120 MG 24 hr tablet TAKE 1 TABLET BY MOUTH ONCE DAILY  . levothyroxine (SYNTHROID) 125 MCG tablet TAKE 1 TABLET BY MOUTH ONCE DAILY BEFORE BREAKFAST  . lidocaine-prilocaine (EMLA) cream Apply 1 application topically See admin instructions. Apply prior to dialysis treatments on Tuesday, Thursday  and Saturday  . MELATONIN PO Take 1 tablet by mouth at bedtime as needed (sleep).  . metoprolol succinate (TOPROL-XL) 25 MG 24 hr tablet Take 1 tablet (25 mg total) by mouth daily. (Patient taking differently: Take 25 mg by mouth daily. Patient Taking on: Sunday, Tuesday, Thursday, Saturday)  . nitroGLYCERIN (NITROSTAT) 0.4 MG SL tablet DISSOLVE ONE TABLET UNDER THE TONGUE EVERY 5 MINUTES AS NEEDED FOR CHEST PAIN. UP TO 3 TIMES AN EPISODE  . nortriptyline (PAMELOR) 25 MG capsule Take 1 capsule (25 mg total) by mouth at bedtime.  Marland Kitchen nystatin (MYCOSTATIN) 100000 UNIT/ML suspension Take 5 mLs by mouth 4 (four) times daily.  - still has issues with Thrush   Allergies  Allergen Reactions  . Brilinta [Ticagrelor] Shortness Of Breath  . Shellfish Allergy Other (See Comments)    Causes gout flare-ups   Social History   Tobacco Use  . Smoking status: Never Smoker  . Smokeless tobacco: Never Used  Substance Use Topics  . Alcohol use: No  . Drug use: No    Social History   Social History Narrative   Long-term patient of Dr. Rollene Fare.   Married father of 51, grandfather 55.   Never smoked.   Not exercising D2 hip and back pain & now Angina    family history includes Diabetes in his father and mother; Heart attack in his father and mother; Heart disease in his father and mother; Hypertension in his father and mother; Stroke in his paternal aunt and paternal uncle.  Wt Readings from Last 3 Encounters:  06/05/18 142 lb (64.4 kg)  04/09/18 140 lb (63.5 kg)  01/29/18 143 lb 3.2 oz (65 kg)    PHYSICAL EXAM BP (!) 102/48 (BP Location: Right Arm, Patient Position: Sitting, Cuff Size: Normal)   Pulse 78   Temp 99.1 F (37.3 C)   Ht 5\' 5"  (1.651 m)   Wt 142 lb (64.4 kg)   BMI 23.63 kg/m   Physical Exam  Constitutional: He is oriented to person, place, and time. No distress.  Chronically ill almost gaunt appearing gentleman.  He appears tired but nontoxic.  No acute distress.  HENT:   Head: Normocephalic and atraumatic.  Has essentially lost all of his hair. Poor dentition  Eyes: Pupils are equal, round, and reactive to light. EOM are normal.  Neck: Normal range of motion. Neck supple. No hepatojugular reflux and no JVD present. Carotid bruit is not present (More consistent with left subclavian bruit fromm his fistula).  Cardiovascular: Normal rate, regular rhythm, S1 normal and S2 normal.  No extrasystoles are present. PMI is not displaced. Exam reveals distant heart sounds and decreased pulses (L wrist (but palpable) - mildy reduced pedal pulses). Exam reveals no gallop, no S4 and no friction rub.  No murmur (can hear a holosystolic bruit throughout the precordium that is most likely related to his fistula) heard. Can hear bruit of fistula-left upper arm  Pulmonary/Chest: Effort normal and breath sounds normal. No respiratory distress. He has no wheezes. He has no rales.  Abdominal: Soft. Bowel sounds are normal. He exhibits no distension. There is no abdominal tenderness. There is no rebound.  No HSM  Musculoskeletal: Normal range of motion.  General: No edema.  Neurological: He is alert and oriented to person, place, and time.  Skin: Skin is warm and dry. There is pallor.  Diffuse bruising on hands & forearms.  Psychiatric: He has a normal mood and affect. His behavior is normal. Judgment and thought content normal.  Nursing note and vitals reviewed.   Adult ECG Report  Rate: 78;  Rhythm: normal sinus rhythm;  LBBB  Narrative Interpretat:  STABLE. Normal axis intervals & durations   Other studies Reviewed: Additional studies/ records that were reviewed today include:  Recent Labs:   Lab Results  Component Value Date   CHOL 145 04/11/2018   HDL 24.10 (L) 04/11/2018   LDLCALC 84 04/11/2018   LDLDIRECT 154.0 04/22/2017   TRIG 184.0 (H) 04/11/2018   CHOLHDL 6 04/11/2018    ASSESSMENT / PLAN: Problem List Items Addressed This Visit    CAD (coronary  artery disease) of bypass graft - PCI to SVG-OM with BMS; PCI-mid RCA with a Xience DES (Chronic)   Relevant Orders   EKG 12-Lead   CBC   Basic metabolic panel   CAD S/P percutaneous coronary angioplasty (Chronic)   Relevant Orders   EKG 12-Lead   CBC   Basic metabolic panel   Chronic diastolic congestive heart failure, NYHA class 2 (HCC) (Chronic)   Hyperlipidemia with target LDL less than 70 (Chronic)   Hypotension of hemodialysis (Chronic)   Progressive angina ([place]) - Primary    At max possible antianginal regimen for his blood pressure tolerance. No longer able to treat without invasive management.   Plan: Left heart catheterization with coronary and graft angiography and possible PCI.    Plan for June 5.  He will need precath COVID-19 testing  Continue Plavix, statin, Imdur and Toprol.       Relevant Orders   EKG 12-Lead   CBC   Basic metabolic panel    Other Visit Diagnoses    Pre-op testing       Relevant Orders   EKG 12-Lead   CBC   Basic metabolic panel     Maycen is now having pretty much unstable/progressive angina symptoms.  He is progress from his chronic stable angina that is been well controlled on low-dose Toprol and high-dose Imdur.  He is pretty much on max tolerated antianginals.  Blood pressure would not tolerate amlodipine and Ranexa is essentially contraindicated with dialysis patients.  At this point, I think we just need to simply plan relook catheterization with possible PCI.  We always have to be concerned about the possibility of other graft disease as opposed to only his RCA.  Most recent lipids show that his LDL is 84 which is a little bit up from last year 65.  Would simply continue current dose of atorvastatin at this point.  Unfortunately he is still having hypertension issues.  He did not seem to be all that interested in using midodrine and again because it did not really seem to be all that effective in the past despite high doses.  The  still possibility that he may need to go to a 4 time a week dialysis schedule.  Plan: Left heart catheterization with coronary and graft angiography and possible PCI.    Plan for June 5.  He will need precath COVID-19 testing  Continue Plavix, statin, Imdur and Toprol.    Performing MD:  [name], M.D., M.S.  Procedure: LEFT HEART CATHETERIZATION WITH NATIVE CORONARY AND GRAFT ANGIOGRAPHY with possible PERCUTANEOUS CORONARY INTERVENTION  The procedure with Risks/Benefits/Alternatives and Indications was reviewed with the patient and his wife [name] [name].  All questions were answered.    Risks / Complications include, but not limited to: Death, MI, CVA/TIA, VF/VT (with defibrillation), Bradycardia (need for temporary pacer placement), bleeding / bruising / hematoma / pseudoaneurysm, radiation-related injury in the case of prolonged fluoroscopy use, vascular or coronary injury (with possible emergent CT or Vascular Surgery), adverse medication reactions, infection.    The patient understands the risks of serious complication is 1-2 in [id number] with diagnostic cardiac cath and 1-2% or less with angioplasty/stenting.   The patient (and wife) voice understanding and agree to proceed.   I have signed the consent form and placed it on the chart for patient signature and RN witness.     As is usually the case, I spent at least 25+ minutes with the patient discussing his condition and progression as well as potential medical options.  50% is spent in direct patient counseling.  Current medicines are reviewed at length with the patient today. (+/- concerns) n/a The following changes have been made: n/a  Patient Instructions  Medication Instructions:   Your physician recommends that you continue on your current medications as directed. Please refer to the Current Medication list given to you today.  If you need a refill on your cardiac medications before your next appointment, please call your  pharmacy.   Lab work: You will need to have labs (blood work) drawn for your Cath procedure:  BMET CBC -RAPID COVID TEST AT 1 PM ON June 4 ,2020 - GO TO [place]  ( [place])  If you have labs (blood work) drawn today and your tests are completely normal, you will receive your results only by: Marland Kitchen MyChart Message (if you have MyChart) OR . A paper copy in the mail If you have any lab test that is abnormal or we need to change your treatment, we will call you to review the results.  Testing/Procedures:  Your physician has requested that you have a cardiac catheterization. Cardiac catheterization is used to diagnose and/or treat various heart conditions. Doctors may recommend this procedure for a number of different reasons. The most common reason is to evaluate chest pain. Chest pain can be a symptom of coronary artery disease (CAD), and cardiac catheterization can show whether plaque is narrowing or blocking your heart's arteries. This procedure is also used to evaluate the valves, as well as measure the blood flow and oxygen levels in different parts of your heart. For further information please visit [email or web address]. Please follow instruction sheet, as given.   Follow-Up: At [facility], you and your health needs are our priority.  As part of our continuing mission to provide you with exceptional heart care, we have created designated Provider Care Teams.  These Care Teams include your primary Cardiologist (physician) and Advanced Practice Providers (APPs -  Physician Assistants and Nurse Practitioners) who all work together to provide you with the care you need, when you need it. You will need a follow up appointment in 1 month.  Please call our office 2 months in advance to schedule this appointment.  You may see [name], MD or one of the following Advanced Practice Providers on your designated Care Team:   Rosaria Ferries, PA-C  . Jory Sims, DNP, ANP  Any Other Special Instructions Will Be Listed Below (If Applicable).       [place]  [facility]ARDIOVASCULAR DIV[facility] [address] [place] [address] Dept: [phone] Loc: [phone]  OLUMIDE DOLINGER  06/05/2018  You are scheduled for a Cardiac Catheterization on Friday, June 5 with Dr. [name].  1. Please arrive at the [facility] (Main Entrance A) at [facility]: [address] [place], [place] [address] at 5:30 AM (This time is two hours before your procedure to ensure your preparation). Free valet parking service is available.   Special note: Every effort is made to have your procedure done on time. Please understand that emergencies sometimes delay scheduled procedures.  2. Diet: Do not eat solid foods after midnight.  The patient may have clear liquids until 5am upon the day of the procedure.  3. Labs:DO LABS TODAY - BMP,CBC--  June 4,2020-- COVID TEST  - [facility]  1 PM  4. Medication instructions in preparation for your procedure:  On the morning of your procedure, take your Plavix/Clopidogrel and any morning medicines NOT listed above.  You may use sips of water.  5. Plan for one night stay--bring personal belongings. 6. Bring a current list of your medications and current insurance cards. 7. You MUST have a responsible person to drive you home. 8. Someone MUST be with you the first 24 hours after you arrive home or your discharge will be delayed. 9. Please wear clothes that are easy to get on and off and wear slip-on shoes.  Thank you for allowing [place] to care for you!   -- [facility]   Studies Ordered:   Orders Placed This Encounter  Procedures  . CBC  . Basic metabolic panel  . EKG 12-Lead      [name], M.D., M.S. Interventional Cardiologist   Pager # [phone] Phone #  [phone] [address]. [place], [place] [address]

## 2018-06-06 LAB — BASIC METABOLIC PANEL
BUN/Creatinine Ratio: 5 — ABNORMAL LOW (ref 10–24)
BUN: 20 mg/dL (ref 8–27)
CO2: 33 mmol/L — ABNORMAL HIGH (ref 20–29)
Calcium: 9.5 mg/dL (ref 8.6–10.2)
Chloride: 93 mmol/L — ABNORMAL LOW (ref 96–106)
Creatinine, Ser: 3.96 mg/dL — ABNORMAL HIGH (ref 0.76–1.27)
GFR calc Af Amer: 16 mL/min/{1.73_m2} — ABNORMAL LOW (ref >59)
GFR calc non Af Amer: 14 mL/min/{1.73_m2} — ABNORMAL LOW (ref >59)
Glucose: 180 mg/dL — ABNORMAL HIGH (ref 65–99)
Potassium: 4.5 mmol/L (ref 3.5–5.2)
Sodium: 143 mmol/L (ref 134–144)

## 2018-06-06 LAB — CBC
Hematocrit: 38.9 % (ref 37.5–51.0)
Hemoglobin: 13.3 g/dL (ref 13.0–17.7)
MCH: 33.6 pg — ABNORMAL HIGH (ref 26.6–33.0)
MCHC: 34.2 g/dL (ref 31.5–35.7)
MCV: 98 fL — ABNORMAL HIGH (ref 79–97)
Platelets: 295 10*3/uL (ref 150–450)
RBC: 3.96 x10E6/uL — ABNORMAL LOW (ref 4.14–5.80)
RDW: 12.7 % (ref 11.6–15.4)
WBC: 9.3 10*3/uL (ref 3.4–10.8)

## 2018-06-10 NOTE — Telephone Encounter (Signed)
Spoke to patient --  He states they was issue with appt for his wife and cath day, but patient states they fixed the issue. Patient aware to go have covid test on Thursday at [facility] [address]

## 2018-06-10 NOTE — Telephone Encounter (Signed)
New Message   Patient has heart cath procedure coming up and requesting to speak to you.  Patient wouldn't go into detail about the nature of the questions they have for you.

## 2018-06-12 ENCOUNTER — Other Ambulatory Visit: Payer: Self-pay

## 2018-06-12 ENCOUNTER — Other Ambulatory Visit (HOSPITAL_COMMUNITY)
Admission: RE | Admit: 2018-06-12 | Discharge: 2018-06-12 | Disposition: A | Discharge status: Home / Self Care | Payer: Medicare Other | Source: Ambulatory Visit | Attending: Cardiology | Admitting: Cardiology

## 2018-06-12 ENCOUNTER — Telehealth: Payer: Self-pay | Admitting: *Deleted

## 2018-06-12 DIAGNOSIS — Z1159 Encounter for screening for other viral diseases: Secondary | ICD-10-CM | POA: Insufficient documentation

## 2018-06-12 LAB — SARS CORONAVIRUS 2 BY RT PCR (HOSPITAL ORDER, PERFORMED IN ~~LOC~~ HOSPITAL LAB): SARS Coronavirus 2: NEGATIVE

## 2018-06-12 NOTE — Telephone Encounter (Signed)
Pt contacted pre-catheterization scheduled at [facility] for: Friday June 13, 2018 7:30 AM Verified arrival time and place: [place] [facility] at: 5:30 AM  Covid-19 test date: 06/12/18 [facility] 1 PM after dialysis  No solid food after midnight prior to cath, clear liquids until 5 AM day of procedure. Contrast allergy: no, confirmed with wife   AM meds can be  taken pre-cath with sip of water including: ASA 81 mg Plavix 75 mg  Confirmed patient has responsible person to drive home post procedure and observe 24 hours after arriving home: yes  Due to Covid-19 pandemic no visitors are allowed in the hospital (unless cognitive impairment).  Their designated party will be called when their procedure is over for an update and to arrange pick up.  Patients are required to wear a mask when they enter the hospital.          COVID-19 Pre-Screening Questions:  . In the past 7 to 10 days have you had a cough,  shortness of breath, headache, congestion, fever (100 or greater) body aches, chills, sore throat, or sudden loss of taste or sense of smell? no . Have you been around anyone with known Covid 19? no . Have you been around anyone who is awaiting Covid 19 test results in the past 7 to 10 days? no . Have you been around anyone who has been exposed to Covid 19, or has mentioned symptoms of Covid 19 within the past 7 to 10 days? no  I reviewed procedure, mask, visitor, Covid 19 screening questions with patient's wife (DPR), she verbalized understanding, thanked me for call.

## 2018-06-13 ENCOUNTER — Encounter (HOSPITAL_COMMUNITY)
Admission: RE | Disposition: A | Discharge status: Home / Self Care | Payer: Self-pay | Source: Home / Self Care | Attending: Cardiology

## 2018-06-13 ENCOUNTER — Encounter (HOSPITAL_COMMUNITY): Payer: Self-pay | Admitting: Cardiology

## 2018-06-13 ENCOUNTER — Ambulatory Visit (HOSPITAL_COMMUNITY)
Admission: RE | Admit: 2018-06-13 | Discharge: 2018-06-13 | Disposition: A | Discharge status: Home / Self Care | Payer: Medicare Other | Attending: Cardiology | Admitting: Cardiology

## 2018-06-13 ENCOUNTER — Other Ambulatory Visit: Payer: Self-pay

## 2018-06-13 DIAGNOSIS — I255 Ischemic cardiomyopathy: Secondary | ICD-10-CM | POA: Diagnosis not present

## 2018-06-13 DIAGNOSIS — M109 Gout, unspecified: Secondary | ICD-10-CM | POA: Insufficient documentation

## 2018-06-13 DIAGNOSIS — Y831 Surgical operation with implant of artificial internal device as the cause of abnormal reaction of the patient, or of later complication, without mention of misadventure at the time of the procedure: Secondary | ICD-10-CM | POA: Insufficient documentation

## 2018-06-13 DIAGNOSIS — Z992 Dependence on renal dialysis: Secondary | ICD-10-CM | POA: Insufficient documentation

## 2018-06-13 DIAGNOSIS — Z955 Presence of coronary angioplasty implant and graft: Secondary | ICD-10-CM | POA: Diagnosis not present

## 2018-06-13 DIAGNOSIS — T82855A Stenosis of coronary artery stent, initial encounter: Secondary | ICD-10-CM | POA: Insufficient documentation

## 2018-06-13 DIAGNOSIS — I120 Hypertensive chronic kidney disease with stage 5 chronic kidney disease or end stage renal disease: Secondary | ICD-10-CM | POA: Diagnosis not present

## 2018-06-13 DIAGNOSIS — Z888 Allergy status to other drugs, medicaments and biological substances status: Secondary | ICD-10-CM | POA: Diagnosis not present

## 2018-06-13 DIAGNOSIS — I447 Left bundle-branch block, unspecified: Secondary | ICD-10-CM | POA: Insufficient documentation

## 2018-06-13 DIAGNOSIS — Z8249 Family history of ischemic heart disease and other diseases of the circulatory system: Secondary | ICD-10-CM | POA: Insufficient documentation

## 2018-06-13 DIAGNOSIS — N400 Enlarged prostate without lower urinary tract symptoms: Secondary | ICD-10-CM | POA: Diagnosis not present

## 2018-06-13 DIAGNOSIS — Z951 Presence of aortocoronary bypass graft: Secondary | ICD-10-CM | POA: Insufficient documentation

## 2018-06-13 DIAGNOSIS — R0602 Shortness of breath: Secondary | ICD-10-CM | POA: Diagnosis not present

## 2018-06-13 DIAGNOSIS — Z7902 Long term (current) use of antithrombotics/antiplatelets: Secondary | ICD-10-CM | POA: Insufficient documentation

## 2018-06-13 DIAGNOSIS — E039 Hypothyroidism, unspecified: Secondary | ICD-10-CM | POA: Insufficient documentation

## 2018-06-13 DIAGNOSIS — Z9049 Acquired absence of other specified parts of digestive tract: Secondary | ICD-10-CM | POA: Insufficient documentation

## 2018-06-13 DIAGNOSIS — Z8673 Personal history of transient ischemic attack (TIA), and cerebral infarction without residual deficits: Secondary | ICD-10-CM | POA: Diagnosis not present

## 2018-06-13 DIAGNOSIS — Z823 Family history of stroke: Secondary | ICD-10-CM | POA: Insufficient documentation

## 2018-06-13 DIAGNOSIS — I200 Unstable angina: Secondary | ICD-10-CM

## 2018-06-13 DIAGNOSIS — Z79899 Other long term (current) drug therapy: Secondary | ICD-10-CM | POA: Diagnosis not present

## 2018-06-13 DIAGNOSIS — M1990 Unspecified osteoarthritis, unspecified site: Secondary | ICD-10-CM | POA: Insufficient documentation

## 2018-06-13 DIAGNOSIS — I5032 Chronic diastolic (congestive) heart failure: Secondary | ICD-10-CM | POA: Diagnosis present

## 2018-06-13 DIAGNOSIS — I25119 Atherosclerotic heart disease of native coronary artery with unspecified angina pectoris: Secondary | ICD-10-CM | POA: Diagnosis not present

## 2018-06-13 DIAGNOSIS — E785 Hyperlipidemia, unspecified: Secondary | ICD-10-CM | POA: Insufficient documentation

## 2018-06-13 DIAGNOSIS — E1122 Type 2 diabetes mellitus with diabetic chronic kidney disease: Secondary | ICD-10-CM | POA: Diagnosis not present

## 2018-06-13 DIAGNOSIS — Z833 Family history of diabetes mellitus: Secondary | ICD-10-CM | POA: Insufficient documentation

## 2018-06-13 DIAGNOSIS — I2582 Chronic total occlusion of coronary artery: Secondary | ICD-10-CM | POA: Insufficient documentation

## 2018-06-13 DIAGNOSIS — I25709 Atherosclerosis of coronary artery bypass graft(s), unspecified, with unspecified angina pectoris: Secondary | ICD-10-CM | POA: Diagnosis not present

## 2018-06-13 DIAGNOSIS — Z01818 Encounter for other preprocedural examination: Secondary | ICD-10-CM

## 2018-06-13 DIAGNOSIS — I25110 Atherosclerotic heart disease of native coronary artery with unstable angina pectoris: Secondary | ICD-10-CM | POA: Diagnosis not present

## 2018-06-13 DIAGNOSIS — Z79890 Hormone replacement therapy (postmenopausal): Secondary | ICD-10-CM | POA: Diagnosis not present

## 2018-06-13 DIAGNOSIS — I2584 Coronary atherosclerosis due to calcified coronary lesion: Secondary | ICD-10-CM | POA: Insufficient documentation

## 2018-06-13 DIAGNOSIS — N186 End stage renal disease: Secondary | ICD-10-CM | POA: Diagnosis not present

## 2018-06-13 HISTORY — PX: LEFT HEART CATH AND CORS/GRAFTS ANGIOGRAPHY: CATH118250

## 2018-06-13 LAB — POCT ACTIVATED CLOTTING TIME
Activated Clotting Time: 175 seconds
Activated Clotting Time: 202 seconds

## 2018-06-13 LAB — GLUCOSE, CAPILLARY: Glucose-Capillary: 89 mg/dL (ref 70–99)

## 2018-06-13 SURGERY — LEFT HEART CATH AND CORS/GRAFTS ANGIOGRAPHY
Anesthesia: LOCAL

## 2018-06-13 MED ORDER — HEPARIN (PORCINE) IN NACL 1000-0.9 UT/500ML-% IV SOLN
INTRAVENOUS | Status: AC
  Filled 2018-06-13: qty 1000

## 2018-06-13 MED ORDER — LIDOCAINE HCL (PF) 1 % IJ SOLN
INTRAMUSCULAR | Status: DC | PRN
  Administered 2018-06-13: 15 mL via INTRADERMAL

## 2018-06-13 MED ORDER — SODIUM CHLORIDE 0.9 % IV SOLN: 250.0000 mL | INTRAVENOUS | Status: DC | PRN

## 2018-06-13 MED ORDER — MORPHINE SULFATE (PF) 2 MG/ML IV SOLN: 2.0000 mg | INTRAVENOUS | Status: DC | PRN

## 2018-06-13 MED ORDER — NITROGLYCERIN 1 MG/10 ML FOR IR/CATH LAB
INTRA_ARTERIAL | Status: DC | PRN
  Administered 2018-06-13 (×2): 200 ug via INTRACORONARY

## 2018-06-13 MED ORDER — ONDANSETRON HCL 4 MG/2ML IJ SOLN: 4.0000 mg | Freq: Four times a day (QID) | INTRAMUSCULAR | Status: DC | PRN

## 2018-06-13 MED ORDER — FENTANYL CITRATE (PF) 100 MCG/2ML IJ SOLN
INTRAMUSCULAR | Status: DC | PRN
  Administered 2018-06-13: 25 ug via INTRAVENOUS

## 2018-06-13 MED ORDER — ACETAMINOPHEN 325 MG PO TABS: 650.0000 mg | ORAL_TABLET | ORAL | Status: DC | PRN

## 2018-06-13 MED ORDER — MIDAZOLAM HCL 2 MG/2ML IJ SOLN
INTRAMUSCULAR | Status: DC | PRN
  Administered 2018-06-13: 1 mg via INTRAVENOUS

## 2018-06-13 MED ORDER — SODIUM CHLORIDE 0.9% FLUSH: 3.0000 mL | INTRAVENOUS | Status: DC | PRN

## 2018-06-13 MED ORDER — SODIUM CHLORIDE 0.9 % IV SOLN
INTRAVENOUS | Status: DC
  Administered 2018-06-13: 06:00:00 via INTRAVENOUS

## 2018-06-13 MED ORDER — LIDOCAINE HCL (PF) 1 % IJ SOLN
INTRAMUSCULAR | Status: AC
  Filled 2018-06-13: qty 30

## 2018-06-13 MED ORDER — ASPIRIN 81 MG PO CHEW: 81.0000 mg | CHEWABLE_TABLET | ORAL | Status: DC

## 2018-06-13 MED ORDER — HEPARIN (PORCINE) IN NACL 1000-0.9 UT/500ML-% IV SOLN
INTRAVENOUS | Status: DC | PRN
  Administered 2018-06-13 (×2): 500 mL

## 2018-06-13 MED ORDER — SODIUM CHLORIDE 0.9% FLUSH: 3.0000 mL | Freq: Two times a day (BID) | INTRAVENOUS | Status: DC

## 2018-06-13 MED ORDER — HEPARIN SODIUM (PORCINE) 1000 UNIT/ML IJ SOLN
INTRAMUSCULAR | Status: DC | PRN
  Administered 2018-06-13: 6000 [IU] via INTRAVENOUS

## 2018-06-13 MED ORDER — MIDAZOLAM HCL 2 MG/2ML IJ SOLN
INTRAMUSCULAR | Status: AC
  Filled 2018-06-13: qty 2

## 2018-06-13 MED ORDER — NITROGLYCERIN 1 MG/10 ML FOR IR/CATH LAB
INTRA_ARTERIAL | Status: AC
  Filled 2018-06-13: qty 10

## 2018-06-13 MED ORDER — IOHEXOL 350 MG/ML SOLN
INTRAVENOUS | Status: DC | PRN
  Administered 2018-06-13: 95 mL via INTRAVENOUS

## 2018-06-13 MED ORDER — FENTANYL CITRATE (PF) 100 MCG/2ML IJ SOLN
INTRAMUSCULAR | Status: AC
  Filled 2018-06-13: qty 2

## 2018-06-13 SURGICAL SUPPLY — 13 items
CATH INFINITI 5 FR IM (CATHETERS) ×1 IMPLANT
CATH INFINITI 5FR MULTPACK ANG (CATHETERS) ×1 IMPLANT
COVER DOME SNAP 22 D (MISCELLANEOUS) ×1 IMPLANT
KIT ENCORE 26 ADVANTAGE (KITS) IMPLANT
KIT HEART LEFT (KITS) ×2 IMPLANT
PACK CARDIAC CATHETERIZATION (CUSTOM PROCEDURE TRAY) ×2 IMPLANT
SHEATH PINNACLE 5F 10CM (SHEATH) ×1 IMPLANT
SHEATH PINNACLE 6F 10CM (SHEATH) ×1 IMPLANT
SHEATH PROBE COVER 6X72 (BAG) ×1 IMPLANT
TRANSDUCER W/STOPCOCK (MISCELLANEOUS) ×2 IMPLANT
TUBING CIL FLEX 10 FLL-RA (TUBING) ×2 IMPLANT
WIRE EMERALD 3MM-J .035X150CM (WIRE) ×1 IMPLANT
WIRE EMERALD 3MM-J .035X260CM (WIRE) ×1 IMPLANT

## 2018-06-13 NOTE — Interval H&P Note (Signed)
History and Physical Interval Note:  06/13/2018 7:14 AM  [name]  has presented today for surgery, with the diagnosis of Progressive Angina.  The various methods of treatment have been discussed with the patient and family. After consideration of risks, benefits and other options for treatment, the patient has consented to  Procedure(s): LEFT HEART CATH AND CORS/GRAFTS ANGIOGRAPHY (N/A) as a surgical intervention.    The patient's history has been reviewed, patient examined, no change in status, stable for surgery.  I have reviewed the patient's chart and labs.  Questions were answered to the patient's satisfaction.    Cath Lab Visit (complete for each Cath Lab visit)  Clinical Evaluation Leading to the Procedure:   ACS: No.  Non-ACS:    Anginal Classification: CCS IV  Anti-ischemic medical therapy: Maximal Therapy (2 or more classes of medications) - max tolerated  Non-Invasive Test Results: No non-invasive testing performed  Prior CABG: Previous CABG   [name]

## 2018-06-13 NOTE — Discharge Instructions (Signed)
Femoral Site Care This sheet gives you information about how to care for yourself after your procedure. Your health care provider may also give you more specific instructions. If you have problems or questions, contact your health care provider. What can I expect after the procedure? After the procedure, it is common to have:  Bruising that usually fades within 1-2 weeks.  Tenderness at the site. Follow these instructions at home: Wound care  Follow instructions from your health care provider about how to take care of your insertion site. Make sure you: ? Wash your hands with soap and water before you change your bandage (dressing). If soap and water are not available, use hand sanitizer. ? Remove your dressing as told by your health care provider. In 24 hours ? Leave stitches (sutures), skin glue, or adhesive strips in place. These skin closures may need to stay in place for 2 weeks or longer. If adhesive strip edges start to loosen and curl up, you may trim the loose edges. Do not remove adhesive strips completely unless your health care provider tells you to do that.  Do not take baths, swim, or use a hot tub until your health care provider approves.  You may shower 24-48 hours after the procedure or as told by your health care provider. ? Gently wash the site with plain soap and water. ? Pat the area dry with a clean towel. ? Do not rub the site. This may cause bleeding.  Do not apply powder or lotion to the site. Keep the site clean and dry.  Check your femoral site every day for signs of infection. Check for: ? Redness, swelling, or pain. ? Fluid or blood. ? Warmth. ? Pus or a bad smell. Activity  For the first 2-3 days after your procedure, or as long as directed: ? Avoid climbing stairs as much as possible. ? Do not squat.  Do not lift anything that is heavier than 10 lb (4.5 kg), or the limit that you are told, until your health care provider says that it is safe. For 5  days  Rest as directed. ? Avoid sitting for a long time without moving. Get up to take short walks every 1-2 hours.  Do not drive for 24 hours if you were given a medicine to help you relax (sedative). General instructions  Take over-the-counter and prescription medicines only as told by your health care provider.  Keep all follow-up visits as told by your health care provider. This is important. Contact a health care provider if you have:  A fever or chills.  You have redness, swelling, or pain around your insertion site. Get help right away if:  The catheter insertion area swells very fast.  You pass out.  You suddenly start to sweat or your skin gets clammy.  The catheter insertion area is bleeding, and the bleeding does not stop when you hold steady pressure on the area.  The area near or just beyond the catheter insertion site becomes pale, cool, tingly, or numb. These symptoms may represent a serious problem that is an emergency. Do not wait to see if the symptoms will go away. Get medical help right away. Call your local emergency services (911 in the [place] Do not drive yourself to the hospital. Summary  After the procedure, it is common to have bruising that usually fades within 1-2 weeks.  Check your femoral site every day for signs of infection.  Do not lift anything that is heavier than  10 lb (4.5 kg), or the limit that you are told, until your health care provider says that it is safe. °This information is not intended to replace advice given to you by your health care provider. Make sure you discuss any questions you have with your health care provider. °Document Released: 08/28/2013 Document Revised: 01/07/2017 Document Reviewed: 01/07/2017 °Elsevier Interactive Patient Education © 2019 [employer] ° °

## 2018-06-13 NOTE — Progress Notes (Signed)
Site area: Right groin a 5 french arterial sheath was removed  Site Prior to Removal:  Level 0  Pressure Applied For 20 MINUTES    Bedrest Beginning at 1000am  Manual:   Yes.    Patient Status During Pull:  stable  Post Pull Groin Site:  Level 0  Post Pull Instructions Given:  Yes.    Post Pull Pulses Present:  Yes.    Dressing Applied:  Yes.    Comments:

## 2018-06-16 ENCOUNTER — Telehealth: Payer: Self-pay | Admitting: Cardiology

## 2018-06-16 NOTE — Telephone Encounter (Signed)
New message   Patient needs instructions for his procedure on tomorrow. Please call.

## 2018-06-16 NOTE — Telephone Encounter (Signed)
Returned call to patient he stated he had a cath this past Friday 6/5 and he was told he will need another cath to open up a blocked artery tomorrow Tue 6/9.Stated no one told him the time and he is suppose to have dialysis tomorrow.Spoke to Trish atherectomy scheduled 6/9 at 9:00 am with Dr.Smith arrive at 7:00 am.Dialysis will be done after procedure.Patient stated he has been quarantined since Fri 06/13/18.Advised of instructions.Patient verbalized understanding.

## 2018-06-17 ENCOUNTER — Ambulatory Visit (HOSPITAL_COMMUNITY): Payer: Medicare Other

## 2018-06-17 ENCOUNTER — Encounter (HOSPITAL_COMMUNITY): Admission: RE | Disposition: E | Payer: Self-pay | Source: Home / Self Care | Attending: Cardiology

## 2018-06-17 ENCOUNTER — Encounter (HOSPITAL_COMMUNITY): Payer: Self-pay | Admitting: Cardiology

## 2018-06-17 ENCOUNTER — Inpatient Hospital Stay (HOSPITAL_COMMUNITY)
Admission: RE | Admit: 2018-06-17 | Discharge: 2018-07-09 | DRG: 246 | Disposition: E | Payer: Medicare Other | Attending: Cardiology | Admitting: Cardiology

## 2018-06-17 ENCOUNTER — Other Ambulatory Visit: Payer: Self-pay

## 2018-06-17 DIAGNOSIS — Z7902 Long term (current) use of antithrombotics/antiplatelets: Secondary | ICD-10-CM

## 2018-06-17 DIAGNOSIS — Z91013 Allergy to seafood: Secondary | ICD-10-CM

## 2018-06-17 DIAGNOSIS — I132 Hypertensive heart and chronic kidney disease with heart failure and with stage 5 chronic kidney disease, or end stage renal disease: Secondary | ICD-10-CM | POA: Diagnosis present

## 2018-06-17 DIAGNOSIS — E785 Hyperlipidemia, unspecified: Secondary | ICD-10-CM | POA: Diagnosis present

## 2018-06-17 DIAGNOSIS — I6521 Occlusion and stenosis of right carotid artery: Secondary | ICD-10-CM | POA: Diagnosis present

## 2018-06-17 DIAGNOSIS — I255 Ischemic cardiomyopathy: Secondary | ICD-10-CM | POA: Diagnosis present

## 2018-06-17 DIAGNOSIS — F419 Anxiety disorder, unspecified: Secondary | ICD-10-CM | POA: Diagnosis present

## 2018-06-17 DIAGNOSIS — Z981 Arthrodesis status: Secondary | ICD-10-CM

## 2018-06-17 DIAGNOSIS — Z79899 Other long term (current) drug therapy: Secondary | ICD-10-CM

## 2018-06-17 DIAGNOSIS — I472 Ventricular tachycardia, unspecified: Secondary | ICD-10-CM

## 2018-06-17 DIAGNOSIS — G9341 Metabolic encephalopathy: Secondary | ICD-10-CM | POA: Diagnosis not present

## 2018-06-17 DIAGNOSIS — Z9049 Acquired absence of other specified parts of digestive tract: Secondary | ICD-10-CM

## 2018-06-17 DIAGNOSIS — N400 Enlarged prostate without lower urinary tract symptoms: Secondary | ICD-10-CM | POA: Diagnosis present

## 2018-06-17 DIAGNOSIS — I25110 Atherosclerotic heart disease of native coronary artery with unstable angina pectoris: Secondary | ICD-10-CM | POA: Diagnosis not present

## 2018-06-17 DIAGNOSIS — Z85828 Personal history of other malignant neoplasm of skin: Secondary | ICD-10-CM

## 2018-06-17 DIAGNOSIS — E876 Hypokalemia: Secondary | ICD-10-CM | POA: Diagnosis not present

## 2018-06-17 DIAGNOSIS — Z961 Presence of intraocular lens: Secondary | ICD-10-CM | POA: Diagnosis present

## 2018-06-17 DIAGNOSIS — K5900 Constipation, unspecified: Secondary | ICD-10-CM

## 2018-06-17 DIAGNOSIS — I200 Unstable angina: Secondary | ICD-10-CM | POA: Diagnosis present

## 2018-06-17 DIAGNOSIS — E875 Hyperkalemia: Secondary | ICD-10-CM | POA: Diagnosis not present

## 2018-06-17 DIAGNOSIS — Z8249 Family history of ischemic heart disease and other diseases of the circulatory system: Secondary | ICD-10-CM

## 2018-06-17 DIAGNOSIS — E039 Hypothyroidism, unspecified: Secondary | ICD-10-CM | POA: Diagnosis present

## 2018-06-17 DIAGNOSIS — I25119 Atherosclerotic heart disease of native coronary artery with unspecified angina pectoris: Secondary | ICD-10-CM | POA: Diagnosis present

## 2018-06-17 DIAGNOSIS — Z452 Encounter for adjustment and management of vascular access device: Secondary | ICD-10-CM

## 2018-06-17 DIAGNOSIS — I469 Cardiac arrest, cause unspecified: Secondary | ICD-10-CM | POA: Diagnosis not present

## 2018-06-17 DIAGNOSIS — E1122 Type 2 diabetes mellitus with diabetic chronic kidney disease: Secondary | ICD-10-CM | POA: Diagnosis present

## 2018-06-17 DIAGNOSIS — I953 Hypotension of hemodialysis: Secondary | ICD-10-CM | POA: Diagnosis present

## 2018-06-17 DIAGNOSIS — I5041 Acute combined systolic (congestive) and diastolic (congestive) heart failure: Secondary | ICD-10-CM | POA: Diagnosis not present

## 2018-06-17 DIAGNOSIS — E1165 Type 2 diabetes mellitus with hyperglycemia: Secondary | ICD-10-CM | POA: Diagnosis present

## 2018-06-17 DIAGNOSIS — Z85038 Personal history of other malignant neoplasm of large intestine: Secondary | ICD-10-CM

## 2018-06-17 DIAGNOSIS — I447 Left bundle-branch block, unspecified: Secondary | ICD-10-CM | POA: Diagnosis present

## 2018-06-17 DIAGNOSIS — Z9861 Coronary angioplasty status: Secondary | ICD-10-CM | POA: Diagnosis not present

## 2018-06-17 DIAGNOSIS — J9600 Acute respiratory failure, unspecified whether with hypoxia or hypercapnia: Secondary | ICD-10-CM | POA: Diagnosis not present

## 2018-06-17 DIAGNOSIS — E8889 Other specified metabolic disorders: Secondary | ICD-10-CM | POA: Diagnosis present

## 2018-06-17 DIAGNOSIS — R680 Hypothermia, not associated with low environmental temperature: Secondary | ICD-10-CM | POA: Diagnosis not present

## 2018-06-17 DIAGNOSIS — Z8673 Personal history of transient ischemic attack (TIA), and cerebral infarction without residual deficits: Secondary | ICD-10-CM

## 2018-06-17 DIAGNOSIS — T82855A Stenosis of coronary artery stent, initial encounter: Secondary | ICD-10-CM | POA: Diagnosis present

## 2018-06-17 DIAGNOSIS — E1140 Type 2 diabetes mellitus with diabetic neuropathy, unspecified: Secondary | ICD-10-CM | POA: Diagnosis present

## 2018-06-17 DIAGNOSIS — Z9842 Cataract extraction status, left eye: Secondary | ICD-10-CM

## 2018-06-17 DIAGNOSIS — G8929 Other chronic pain: Secondary | ICD-10-CM | POA: Diagnosis present

## 2018-06-17 DIAGNOSIS — I252 Old myocardial infarction: Secondary | ICD-10-CM

## 2018-06-17 DIAGNOSIS — I5032 Chronic diastolic (congestive) heart failure: Secondary | ICD-10-CM | POA: Diagnosis present

## 2018-06-17 DIAGNOSIS — R570 Cardiogenic shock: Secondary | ICD-10-CM | POA: Diagnosis not present

## 2018-06-17 DIAGNOSIS — J9690 Respiratory failure, unspecified, unspecified whether with hypoxia or hypercapnia: Secondary | ICD-10-CM

## 2018-06-17 DIAGNOSIS — N186 End stage renal disease: Secondary | ICD-10-CM | POA: Diagnosis present

## 2018-06-17 DIAGNOSIS — Z9841 Cataract extraction status, right eye: Secondary | ICD-10-CM

## 2018-06-17 DIAGNOSIS — Z823 Family history of stroke: Secondary | ICD-10-CM

## 2018-06-17 DIAGNOSIS — Z8711 Personal history of peptic ulcer disease: Secondary | ICD-10-CM

## 2018-06-17 DIAGNOSIS — Z7982 Long term (current) use of aspirin: Secondary | ICD-10-CM

## 2018-06-17 DIAGNOSIS — N2581 Secondary hyperparathyroidism of renal origin: Secondary | ICD-10-CM | POA: Diagnosis present

## 2018-06-17 DIAGNOSIS — T82855D Stenosis of coronary artery stent, subsequent encounter: Secondary | ICD-10-CM | POA: Diagnosis not present

## 2018-06-17 DIAGNOSIS — Y831 Surgical operation with implant of artificial internal device as the cause of abnormal reaction of the patient, or of later complication, without mention of misadventure at the time of the procedure: Secondary | ICD-10-CM | POA: Diagnosis present

## 2018-06-17 DIAGNOSIS — K7200 Acute and subacute hepatic failure without coma: Secondary | ICD-10-CM | POA: Diagnosis not present

## 2018-06-17 DIAGNOSIS — Z955 Presence of coronary angioplasty implant and graft: Secondary | ICD-10-CM

## 2018-06-17 DIAGNOSIS — Z888 Allergy status to other drugs, medicaments and biological substances status: Secondary | ICD-10-CM

## 2018-06-17 DIAGNOSIS — D631 Anemia in chronic kidney disease: Secondary | ICD-10-CM | POA: Diagnosis present

## 2018-06-17 DIAGNOSIS — Z4659 Encounter for fitting and adjustment of other gastrointestinal appliance and device: Secondary | ICD-10-CM

## 2018-06-17 DIAGNOSIS — R509 Fever, unspecified: Secondary | ICD-10-CM | POA: Diagnosis not present

## 2018-06-17 DIAGNOSIS — I2510 Atherosclerotic heart disease of native coronary artery without angina pectoris: Secondary | ICD-10-CM

## 2018-06-17 DIAGNOSIS — Z79890 Hormone replacement therapy (postmenopausal): Secondary | ICD-10-CM

## 2018-06-17 DIAGNOSIS — Z833 Family history of diabetes mellitus: Secondary | ICD-10-CM

## 2018-06-17 DIAGNOSIS — R079 Chest pain, unspecified: Secondary | ICD-10-CM

## 2018-06-17 DIAGNOSIS — I462 Cardiac arrest due to underlying cardiac condition: Secondary | ICD-10-CM | POA: Diagnosis not present

## 2018-06-17 DIAGNOSIS — Z01818 Encounter for other preprocedural examination: Secondary | ICD-10-CM

## 2018-06-17 DIAGNOSIS — R1310 Dysphagia, unspecified: Secondary | ICD-10-CM | POA: Diagnosis present

## 2018-06-17 DIAGNOSIS — Z9289 Personal history of other medical treatment: Secondary | ICD-10-CM

## 2018-06-17 DIAGNOSIS — Z992 Dependence on renal dialysis: Secondary | ICD-10-CM

## 2018-06-17 HISTORY — DX: Type 2 diabetes mellitus with diabetic chronic kidney disease: E11.22

## 2018-06-17 HISTORY — PX: CORONARY ATHERECTOMY: CATH118238

## 2018-06-17 HISTORY — PX: CORONARY STENT INTERVENTION: CATH118234

## 2018-06-17 HISTORY — DX: Other forms of angina pectoris: I20.8

## 2018-06-17 HISTORY — DX: Other forms of angina pectoris: I20.89

## 2018-06-17 LAB — BASIC METABOLIC PANEL
Anion gap: 15 (ref 5–15)
Anion gap: 19 — ABNORMAL HIGH (ref 5–15)
Anion gap: 21 — ABNORMAL HIGH (ref 5–15)
BUN: 59 mg/dL — ABNORMAL HIGH (ref 8–23)
BUN: 62 mg/dL — ABNORMAL HIGH (ref 8–23)
BUN: 55 mg/dL — ABNORMAL HIGH (ref 8–23)
CO2: 27 mmol/L (ref 22–32)
CO2: 20 mmol/L — ABNORMAL LOW (ref 22–32)
CO2: 18 mmol/L — ABNORMAL LOW (ref 22–32)
Calcium: 8.3 mg/dL — ABNORMAL LOW (ref 8.9–10.3)
Calcium: 7.8 mg/dL — ABNORMAL LOW (ref 8.9–10.3)
Calcium: 9 mg/dL (ref 8.9–10.3)
Chloride: 93 mmol/L — ABNORMAL LOW (ref 98–111)
Chloride: 93 mmol/L — ABNORMAL LOW (ref 98–111)
Chloride: 95 mmol/L — ABNORMAL LOW (ref 98–111)
Creatinine, Ser: 7.76 mg/dL — ABNORMAL HIGH (ref 0.61–1.24)
Creatinine, Ser: 7.93 mg/dL — ABNORMAL HIGH (ref 0.61–1.24)
Creatinine, Ser: 6.81 mg/dL — ABNORMAL HIGH (ref 0.61–1.24)
GFR calc Af Amer: 7 mL/min — ABNORMAL LOW (ref >60)
GFR calc Af Amer: 7 mL/min — ABNORMAL LOW (ref >60)
GFR calc Af Amer: 8 mL/min — ABNORMAL LOW (ref >60)
GFR calc non Af Amer: 6 mL/min — ABNORMAL LOW (ref >60)
GFR calc non Af Amer: 6 mL/min — ABNORMAL LOW (ref >60)
GFR calc non Af Amer: 7 mL/min — ABNORMAL LOW (ref >60)
Glucose, Bld: 89 mg/dL (ref 70–99)
Glucose, Bld: 211 mg/dL — ABNORMAL HIGH (ref 70–99)
Glucose, Bld: 526 mg/dL (ref 70–99)
Potassium: 5.9 mmol/L — ABNORMAL HIGH (ref 3.5–5.1)
Potassium: 7.4 mmol/L (ref 3.5–5.1)
Potassium: 4.8 mmol/L (ref 3.5–5.1)
Sodium: 135 mmol/L (ref 135–145)
Sodium: 132 mmol/L — ABNORMAL LOW (ref 135–145)
Sodium: 134 mmol/L — ABNORMAL LOW (ref 135–145)

## 2018-06-17 LAB — POCT I-STAT, CHEM 8
BUN: 49 mg/dL — ABNORMAL HIGH (ref 8–23)
Calcium, Ion: 1.17 mmol/L (ref 1.15–1.40)
Chloride: 95 mmol/L — ABNORMAL LOW (ref 98–111)
Creatinine, Ser: 6.8 mg/dL — ABNORMAL HIGH (ref 0.61–1.24)
Glucose, Bld: 521 mg/dL (ref 70–99)
HCT: 31 % — ABNORMAL LOW (ref 39.0–52.0)
Hemoglobin: 10.5 g/dL — ABNORMAL LOW (ref 13.0–17.0)
Potassium: 4.7 mmol/L (ref 3.5–5.1)
Sodium: 132 mmol/L — ABNORMAL LOW (ref 135–145)
TCO2: 23 mmol/L (ref 22–32)

## 2018-06-17 LAB — CBC
HCT: 35.9 % — ABNORMAL LOW (ref 39.0–52.0)
Hemoglobin: 11.4 g/dL — ABNORMAL LOW (ref 13.0–17.0)
MCH: 32.9 pg (ref 26.0–34.0)
MCHC: 31.8 g/dL (ref 30.0–36.0)
MCV: 103.8 fL — ABNORMAL HIGH (ref 80.0–100.0)
Platelets: 253 10*3/uL (ref 150–400)
RBC: 3.46 MIL/uL — ABNORMAL LOW (ref 4.22–5.81)
RDW: 13.9 % (ref 11.5–15.5)
WBC: 9.7 10*3/uL (ref 4.0–10.5)
nRBC: 0 % (ref 0.0–0.2)

## 2018-06-17 LAB — POTASSIUM: Potassium: 3.2 mmol/L — ABNORMAL LOW (ref 3.5–5.1)

## 2018-06-17 LAB — POCT ACTIVATED CLOTTING TIME
Activated Clotting Time: 368 seconds
Activated Clotting Time: 197 seconds
Activated Clotting Time: 912 seconds
Activated Clotting Time: 246 seconds
Activated Clotting Time: 213 seconds
Activated Clotting Time: 175 seconds

## 2018-06-17 LAB — MAGNESIUM
Magnesium: 1 mg/dL — ABNORMAL LOW (ref 1.7–2.4)
Magnesium: 1.7 mg/dL (ref 1.7–2.4)

## 2018-06-17 LAB — TROPONIN I: Troponin I: 0.52 ng/mL (ref <0.03)

## 2018-06-17 LAB — BLOOD GAS, ARTERIAL

## 2018-06-17 SURGERY — CORONARY ATHERECTOMY
Anesthesia: LOCAL

## 2018-06-17 MED ORDER — DOXERCALCIFEROL 4 MCG/2ML IV SOLN
1.0000 ug | INTRAVENOUS | Status: DC
  Filled 2018-06-17 (×4): qty 2

## 2018-06-17 MED ORDER — ARTIFICIAL TEARS OPHTHALMIC OINT
1.0000 "application " | TOPICAL_OINTMENT | Freq: Three times a day (TID) | OPHTHALMIC | Status: DC
  Administered 2018-06-17 – 2018-06-20 (×8): 1 via OPHTHALMIC
  Filled 2018-06-17: qty 3.5

## 2018-06-17 MED ORDER — SODIUM CHLORIDE 0.9% FLUSH: 3.0000 mL | Freq: Two times a day (BID) | INTRAVENOUS | Status: DC

## 2018-06-17 MED ORDER — HEPARIN (PORCINE) IN NACL 1000-0.9 UT/500ML-% IV SOLN
INTRAVENOUS | Status: AC
  Filled 2018-06-17: qty 1000

## 2018-06-17 MED ORDER — CHLORHEXIDINE GLUCONATE 0.12% ORAL RINSE (MEDLINE KIT)
15.0000 mL | Freq: Two times a day (BID) | OROMUCOSAL | Status: DC
  Administered 2018-06-17 – 2018-06-23 (×13): 15 mL via OROMUCOSAL

## 2018-06-17 MED ORDER — ORAL CARE MOUTH RINSE
15.0000 mL | OROMUCOSAL | Status: DC
  Administered 2018-06-17 – 2018-06-24 (×65): 15 mL via OROMUCOSAL

## 2018-06-17 MED ORDER — MIDAZOLAM 50MG/50ML (1MG/ML) PREMIX INFUSION
2.0000 mg/h | INTRAVENOUS | Status: DC
  Administered 2018-06-17 – 2018-06-18 (×2): 2 mg/h via INTRAVENOUS
  Administered 2018-06-19 (×2): 4 mg/h via INTRAVENOUS
  Filled 2018-06-17 (×5): qty 50

## 2018-06-17 MED ORDER — NITROGLYCERIN 0.4 MG SL SUBL
0.4000 mg | SUBLINGUAL_TABLET | SUBLINGUAL | Status: DC | PRN
  Administered 2018-06-17: 0.4 mg via SUBLINGUAL
  Filled 2018-06-17 (×2): qty 1

## 2018-06-17 MED ORDER — FERRIC CITRATE 1 GM 210 MG(FE) PO TABS
210.0000 mg | ORAL_TABLET | Freq: Two times a day (BID) | ORAL | Status: DC | PRN
  Filled 2018-06-17: qty 1

## 2018-06-17 MED ORDER — CLOPIDOGREL BISULFATE 75 MG PO TABS: 75.0000 mg | ORAL_TABLET | Freq: Every day | ORAL | Status: DC

## 2018-06-17 MED ORDER — SODIUM CHLORIDE 0.9 % IV SOLN
1.0000 ug/kg/min | INTRAVENOUS | Status: DC
  Administered 2018-06-17: 23:00:00 1 ug/kg/min via INTRAVENOUS
  Administered 2018-06-19: 1.5 ug/kg/min via INTRAVENOUS
  Filled 2018-06-17 (×2): qty 20

## 2018-06-17 MED ORDER — NITROGLYCERIN 1 MG/10 ML FOR IR/CATH LAB
INTRA_ARTERIAL | Status: DC | PRN
  Administered 2018-06-17 (×3): 200 ug via INTRACORONARY
  Administered 2018-06-17: 50 ug via INTRACORONARY
  Administered 2018-06-17: 200 ug via INTRACORONARY

## 2018-06-17 MED ORDER — FENTANYL CITRATE (PF) 100 MCG/2ML IJ SOLN
INTRAMUSCULAR | Status: AC
  Filled 2018-06-17: qty 2

## 2018-06-17 MED ORDER — CLONAZEPAM 0.5 MG PO TABS: 0.5000 mg | ORAL_TABLET | Freq: Two times a day (BID) | ORAL | Status: DC | PRN

## 2018-06-17 MED ORDER — CINACALCET HCL 30 MG PO TABS
30.0000 mg | ORAL_TABLET | Freq: Every day | ORAL | Status: DC
  Administered 2018-06-17 – 2018-06-22 (×2): 30 mg via ORAL
  Filled 2018-06-17 (×7): qty 1

## 2018-06-17 MED ORDER — METOPROLOL SUCCINATE ER 25 MG PO TB24: 25.0000 mg | ORAL_TABLET | ORAL | Status: DC

## 2018-06-17 MED ORDER — ISOSORBIDE MONONITRATE ER 60 MG PO TB24: 120.0000 mg | ORAL_TABLET | Freq: Every day | ORAL | Status: DC

## 2018-06-17 MED ORDER — NORTRIPTYLINE HCL 25 MG PO CAPS
25.0000 mg | ORAL_CAPSULE | Freq: Every day | ORAL | Status: DC
  Filled 2018-06-17: qty 1

## 2018-06-17 MED ORDER — CISATRACURIUM BOLUS VIA INFUSION
0.0500 mg/kg | INTRAVENOUS | Status: AC | PRN
  Administered 2018-06-18: 17:00:00 3.4 mg via INTRAVENOUS
  Filled 2018-06-17: qty 4

## 2018-06-17 MED ORDER — SODIUM CHLORIDE 0.9% FLUSH
3.0000 mL | Freq: Two times a day (BID) | INTRAVENOUS | Status: DC
  Administered 2018-06-18 – 2018-06-19 (×2): 3 mL via INTRAVENOUS

## 2018-06-17 MED ORDER — FERRIC CITRATE 1 GM 210 MG(FE) PO TABS: 210.0000 mg | ORAL_TABLET | ORAL | Status: DC

## 2018-06-17 MED ORDER — NYSTATIN 100000 UNIT/ML MT SUSP
5.0000 mL | Freq: Four times a day (QID) | OROMUCOSAL | Status: DC
  Administered 2018-06-17 – 2018-06-23 (×20): 500000 [IU] via ORAL
  Filled 2018-06-17 (×22): qty 5

## 2018-06-17 MED ORDER — LIDOCAINE-PRILOCAINE 2.5-2.5 % EX CREA
1.0000 "application " | TOPICAL_CREAM | Freq: Every day | CUTANEOUS | Status: DC | PRN
  Filled 2018-06-17: qty 5

## 2018-06-17 MED ORDER — SODIUM CHLORIDE 0.9% FLUSH: 3.0000 mL | INTRAVENOUS | Status: DC | PRN

## 2018-06-17 MED ORDER — MAGNESIUM SULFATE 4 GM/100ML IV SOLN
INTRAVENOUS | Status: AC
  Administered 2018-06-17: 19:00:00 4 g via INTRAVENOUS
  Filled 2018-06-17: qty 100

## 2018-06-17 MED ORDER — ONDANSETRON HCL 4 MG/2ML IJ SOLN: 4.0000 mg | Freq: Four times a day (QID) | INTRAMUSCULAR | Status: DC | PRN

## 2018-06-17 MED ORDER — SODIUM CHLORIDE 0.9 % WEIGHT BASED INFUSION: 1.0000 mL/kg/h | INTRAVENOUS | Status: DC

## 2018-06-17 MED ORDER — LIDOCAINE IN D5W 4-5 MG/ML-% IV SOLN
0.5000 mg/min | INTRAVENOUS | Status: DC
  Administered 2018-06-17: 21:00:00 0.5 mg/min via INTRAVENOUS
  Filled 2018-06-17 (×2): qty 500

## 2018-06-17 MED ORDER — AMIODARONE LOAD VIA INFUSION
150.0000 mg | Freq: Once | INTRAVENOUS | Status: AC
  Administered 2018-06-18: 150 mg via INTRAVENOUS
  Filled 2018-06-17: qty 83.34

## 2018-06-17 MED ORDER — INSULIN REGULAR(HUMAN) IN NACL 100-0.9 UT/100ML-% IV SOLN
INTRAVENOUS | Status: DC
  Administered 2018-06-17: 2.2 [IU]/h via INTRAVENOUS
  Administered 2018-06-18: 3.5 [IU]/h via INTRAVENOUS
  Filled 2018-06-17 (×2): qty 100

## 2018-06-17 MED ORDER — SODIUM CHLORIDE 0.9 % IV SOLN: 250.0000 mL | INTRAVENOUS | Status: DC | PRN

## 2018-06-17 MED ORDER — LEVOTHYROXINE SODIUM 25 MCG PO TABS
125.0000 ug | ORAL_TABLET | Freq: Every day | ORAL | Status: DC
  Filled 2018-06-17: qty 1

## 2018-06-17 MED ORDER — FENTANYL CITRATE (PF) 100 MCG/2ML IJ SOLN
25.0000 ug | INTRAMUSCULAR | Status: DC | PRN
  Administered 2018-06-17 – 2018-06-21 (×5): 100 ug via INTRAVENOUS
  Filled 2018-06-17 (×4): qty 2

## 2018-06-17 MED ORDER — FENTANYL CITRATE (PF) 100 MCG/2ML IJ SOLN: 50.0000 ug | Freq: Once | INTRAMUSCULAR | Status: DC

## 2018-06-17 MED ORDER — FENTANYL BOLUS VIA INFUSION
25.0000 ug | INTRAVENOUS | Status: DC | PRN
  Filled 2018-06-17: qty 25

## 2018-06-17 MED ORDER — FENTANYL CITRATE (PF) 100 MCG/2ML IJ SOLN
25.0000 ug | INTRAMUSCULAR | Status: DC | PRN
  Administered 2018-06-18: 17:00:00 25 ug via INTRAVENOUS

## 2018-06-17 MED ORDER — POTASSIUM CHLORIDE CRYS ER 20 MEQ PO TBCR
40.0000 meq | EXTENDED_RELEASE_TABLET | Freq: Once | ORAL | Status: AC
  Administered 2018-06-17: 18:00:00 40 meq via ORAL
  Filled 2018-06-17: qty 2

## 2018-06-17 MED ORDER — SODIUM CHLORIDE 0.9 % IV SOLN
INTRAVENOUS | Status: DC
  Administered 2018-06-17 – 2018-06-18 (×2): via INTRAVENOUS

## 2018-06-17 MED ORDER — CHLORHEXIDINE GLUCONATE CLOTH 2 % EX PADS: 6.0000 | MEDICATED_PAD | Freq: Every day | CUTANEOUS | Status: DC

## 2018-06-17 MED ORDER — AMIODARONE HCL IN DEXTROSE 360-4.14 MG/200ML-% IV SOLN
60.0000 mg/h | INTRAVENOUS | Status: AC
  Administered 2018-06-18: 60 mg/h via INTRAVENOUS
  Filled 2018-06-17: qty 200

## 2018-06-17 MED ORDER — CISATRACURIUM BOLUS VIA INFUSION
0.1000 mg/kg | Freq: Once | INTRAVENOUS | Status: AC
  Administered 2018-06-17: 6.8 mg via INTRAVENOUS
  Filled 2018-06-17: qty 7

## 2018-06-17 MED ORDER — MELATONIN 5 MG PO CHEW: 5.0000 mg | CHEWABLE_TABLET | Freq: Every evening | ORAL | Status: DC | PRN

## 2018-06-17 MED ORDER — NITROGLYCERIN 1 MG/10 ML FOR IR/CATH LAB
INTRA_ARTERIAL | Status: AC
  Filled 2018-06-17: qty 10

## 2018-06-17 MED ORDER — LEVOTHYROXINE SODIUM 100 MCG/5ML IV SOLN
62.5000 ug | Freq: Every day | INTRAVENOUS | Status: DC
  Administered 2018-06-18 – 2018-06-20 (×3): 62.5 ug via INTRAVENOUS
  Filled 2018-06-17 (×4): qty 5

## 2018-06-17 MED ORDER — SODIUM CHLORIDE 0.9 % IV SOLN
INTRAVENOUS | Status: DC | PRN
  Administered 2018-06-17: 10 mL/h via INTRAVENOUS

## 2018-06-17 MED ORDER — FERRIC CITRATE 1 GM 210 MG(FE) PO TABS
420.0000 mg | ORAL_TABLET | Freq: Three times a day (TID) | ORAL | Status: DC
  Administered 2018-06-17 – 2018-06-23 (×9): 420 mg via ORAL
  Filled 2018-06-17 (×21): qty 2

## 2018-06-17 MED ORDER — AMIODARONE HCL IN DEXTROSE 360-4.14 MG/200ML-% IV SOLN
15.0000 mg/h | INTRAVENOUS | Status: DC
  Administered 2018-06-18: 60 mg/h via INTRAVENOUS
  Administered 2018-06-18 – 2018-06-21 (×3): 15 mg/h via INTRAVENOUS
  Filled 2018-06-17 (×5): qty 200

## 2018-06-17 MED ORDER — SODIUM CHLORIDE 0.9 % WEIGHT BASED INFUSION
3.0000 mL/kg/h | INTRAVENOUS | Status: DC
  Administered 2018-06-17: 3 mL/kg/h via INTRAVENOUS

## 2018-06-17 MED ORDER — HEPARIN (PORCINE) IN NACL 1000-0.9 UT/500ML-% IV SOLN
INTRAVENOUS | Status: DC | PRN
  Administered 2018-06-17 (×2): 500 mL

## 2018-06-17 MED ORDER — LIDOCAINE HCL (PF) 1 % IJ SOLN
INTRAMUSCULAR | Status: AC
  Filled 2018-06-17: qty 30

## 2018-06-17 MED ORDER — FENTANYL CITRATE (PF) 100 MCG/2ML IJ SOLN
INTRAMUSCULAR | Status: DC | PRN
  Administered 2018-06-17 (×5): 25 ug via INTRAVENOUS
  Administered 2018-06-17: 50 ug via INTRAVENOUS
  Administered 2018-06-17 (×2): 25 ug via INTRAVENOUS

## 2018-06-17 MED ORDER — HEPARIN SODIUM (PORCINE) 1000 UNIT/ML IJ SOLN
INTRAMUSCULAR | Status: DC | PRN
  Administered 2018-06-17: 4000 [IU] via INTRAVENOUS
  Administered 2018-06-17: 7000 [IU] via INTRAVENOUS

## 2018-06-17 MED ORDER — CLOPIDOGREL BISULFATE 75 MG PO TABS: 75.0000 mg | ORAL_TABLET | Freq: Once | ORAL | Status: DC

## 2018-06-17 MED ORDER — LIDOCAINE HCL (PF) 1 % IJ SOLN
INTRAMUSCULAR | Status: DC | PRN
  Administered 2018-06-17: 20 mL

## 2018-06-17 MED ORDER — ATORVASTATIN CALCIUM 40 MG PO TABS
40.0000 mg | ORAL_TABLET | Freq: Every evening | ORAL | Status: DC
  Administered 2018-06-20 – 2018-06-22 (×3): 40 mg via ORAL
  Filled 2018-06-17 (×4): qty 1

## 2018-06-17 MED ORDER — MIDAZOLAM HCL 2 MG/2ML IJ SOLN
INTRAMUSCULAR | Status: DC | PRN
  Administered 2018-06-17 (×4): 1 mg via INTRAVENOUS

## 2018-06-17 MED ORDER — MAGNESIUM SULFATE 2 GM/50ML IV SOLN
2.0000 g | Freq: Once | INTRAVENOUS | Status: AC
  Administered 2018-06-17: 2 g via INTRAVENOUS
  Filled 2018-06-17: qty 50

## 2018-06-17 MED ORDER — ASPIRIN 81 MG PO CHEW: 81.0000 mg | CHEWABLE_TABLET | ORAL | Status: DC

## 2018-06-17 MED ORDER — HEPARIN SODIUM (PORCINE) 5000 UNIT/ML IJ SOLN
5000.0000 [IU] | Freq: Three times a day (TID) | INTRAMUSCULAR | Status: DC
  Administered 2018-06-17 – 2018-06-24 (×20): 5000 [IU] via SUBCUTANEOUS
  Filled 2018-06-17 (×20): qty 1

## 2018-06-17 MED ORDER — ASPIRIN 81 MG PO CHEW: 81.0000 mg | CHEWABLE_TABLET | Freq: Once | ORAL | Status: DC

## 2018-06-17 MED ORDER — MIDAZOLAM BOLUS VIA INFUSION
1.0000 mg | INTRAVENOUS | Status: DC | PRN
  Filled 2018-06-17: qty 1

## 2018-06-17 MED ORDER — FAMOTIDINE IN NACL 20-0.9 MG/50ML-% IV SOLN
20.0000 mg | INTRAVENOUS | Status: DC
  Administered 2018-06-17 – 2018-06-22 (×6): 20 mg via INTRAVENOUS
  Filled 2018-06-17 (×6): qty 50

## 2018-06-17 MED ORDER — HEPARIN SODIUM (PORCINE) 1000 UNIT/ML IJ SOLN
INTRAMUSCULAR | Status: AC
  Filled 2018-06-17: qty 1

## 2018-06-17 MED ORDER — MAGNESIUM SULFATE 4 GM/100ML IV SOLN
4.0000 g | Freq: Once | INTRAVENOUS | Status: AC
  Administered 2018-06-17: 4 g via INTRAVENOUS

## 2018-06-17 MED ORDER — NOREPINEPHRINE 4 MG/250ML-% IV SOLN
0.0000 ug/min | INTRAVENOUS | Status: DC
  Administered 2018-06-17: 23:00:00 2 ug/min via INTRAVENOUS
  Administered 2018-06-18: 4 ug/min via INTRAVENOUS
  Administered 2018-06-19: 7 ug/min via INTRAVENOUS
  Administered 2018-06-19: 12 ug/min via INTRAVENOUS
  Administered 2018-06-19: 16 ug/min via INTRAVENOUS
  Administered 2018-06-20: 11 ug/min via INTRAVENOUS
  Filled 2018-06-17 (×7): qty 250

## 2018-06-17 MED ORDER — MIDAZOLAM HCL 2 MG/2ML IJ SOLN
INTRAMUSCULAR | Status: AC
  Filled 2018-06-17: qty 2

## 2018-06-17 MED ORDER — ACETAMINOPHEN 500 MG PO TABS
1000.0000 mg | ORAL_TABLET | Freq: Two times a day (BID) | ORAL | Status: DC | PRN
  Filled 2018-06-17: qty 2

## 2018-06-17 MED ORDER — IOHEXOL 350 MG/ML SOLN
INTRAVENOUS | Status: DC | PRN
  Administered 2018-06-17: 125 mL via INTRA_ARTERIAL

## 2018-06-17 MED ORDER — FENTANYL 2500MCG IN NS 250ML (10MCG/ML) PREMIX INFUSION
100.0000 ug/h | INTRAVENOUS | Status: DC
  Administered 2018-06-17: 23:00:00 50 ug/h via INTRAVENOUS
  Administered 2018-06-18: 22:00:00 150 ug/h via INTRAVENOUS
  Administered 2018-06-19: 11:00:00 200 ug/h via INTRAVENOUS
  Filled 2018-06-17 (×3): qty 250

## 2018-06-17 MED ORDER — MELATONIN 3 MG PO TABS
6.0000 mg | ORAL_TABLET | Freq: Every evening | ORAL | Status: DC | PRN
  Filled 2018-06-17: qty 2

## 2018-06-17 MED ORDER — MIDAZOLAM HCL 2 MG/2ML IJ SOLN: 1.0000 mg | Freq: Once | INTRAMUSCULAR | Status: DC

## 2018-06-17 SURGICAL SUPPLY — 28 items
BALLN SAPPHIRE 2.5X12 (BALLOONS) ×2
BALLN SAPPHIRE 3.0X15 (BALLOONS) ×2
BALLN SAPPHIRE ~~LOC~~ 3.5X10 (BALLOONS) ×1 IMPLANT
BALLN WOLVERINE 3.00X6 (BALLOONS) ×2
BALLOON SAPPHIRE 2.5X12 (BALLOONS) IMPLANT
BALLOON SAPPHIRE 3.0X15 (BALLOONS) IMPLANT
BALLOON WOLVERINE 3.00X6 (BALLOONS) IMPLANT
CABLE ADAPT CONN TEMP 6FT (ADAPTER) ×1 IMPLANT
CATH LAUNCHER 6FR AR1 (CATHETERS) ×1 IMPLANT
CATH S G BIP PACING (CATHETERS) ×1 IMPLANT
CATH TELEPORT (CATHETERS) ×1 IMPLANT
CROWN DIAMONDBACK CLASSIC 1.25 (BURR) ×1 IMPLANT
ELECT DEFIB PAD ADLT CADENCE (PAD) ×1 IMPLANT
GUIDELINER 6F (CATHETERS) ×1 IMPLANT
KIT ENCORE 26 ADVANTAGE (KITS) ×1 IMPLANT
KIT HEART LEFT (KITS) ×2 IMPLANT
LUBRICANT VIPERSLIDE CORONARY (MISCELLANEOUS) ×1 IMPLANT
PACK CARDIAC CATHETERIZATION (CUSTOM PROCEDURE TRAY) ×2 IMPLANT
SHEATH PINNACLE 6F 10CM (SHEATH) ×2 IMPLANT
SLEEVE REPOSITIONING LENGTH 30 (MISCELLANEOUS) ×1 IMPLANT
STENT RESOLUTE ONYX3.0X38 (Permanent Stent) ×1 IMPLANT
TRANSDUCER W/STOPCOCK (MISCELLANEOUS) ×2 IMPLANT
TUBING CIL FLEX 10 FLL-RA (TUBING) ×2 IMPLANT
WIRE ASAHI GRAND SLAM 180CM (WIRE) ×1 IMPLANT
WIRE ASAHI PROWATER 300CM (WIRE) ×1 IMPLANT
WIRE EMERALD 3MM-J .035X150CM (WIRE) ×1 IMPLANT
WIRE GUIDE ASAHI EXTENSION 165 (WIRE) ×1 IMPLANT
WIRE VIPERWIRE COR FLEX .012 (WIRE) ×1 IMPLANT

## 2018-06-17 NOTE — Progress Notes (Signed)
Spoke with patient's wife, [name], about events of the evening. She will be coming to the hospital. Spoke with PCCM who will come by to evaluate patient and relayed message that nephrology would like a triple lumen cath placed for CRRT. Cardiology has been notified and recommended lidocaine + amiodarone. They have already evaluated the patient and will have the night fellow come by this evening to check on the patient.

## 2018-06-17 NOTE — Procedures (Signed)
Intubation Procedure Note [name] [name] [id number] 06-Aug-1940  Procedure: Intubation Indications: Respiratory insufficiency  Procedure Details Consent: Unable to obtain consent because of emergent medical necessity. Time Out: Verified patient identification, verified procedure, site/side was marked, verified correct patient position, special equipment/implants available, medications/allergies/relevent history reviewed, required imaging and test results available.  Performed  Maximum sterile technique was used including antiseptics, gloves, hand hygiene and mask.  MAC and 4  Evaluation Hemodynamic Status: BP stable throughout; O2 sats: stable throughout Patient's Current Condition: unstable Complications: No apparent complications Patient did tolerate procedure well. Chest X-ray ordered to verify placement.  CXR: pending.  Patient was intubated by Self due to Respiratory Insuffiencey and due to CODE BLUE initiated at this time. ACLS protocol followed. MD at bedside. Patient was intubated under Direct Laryngoscopy using MAC 4 blade. Difficult airway unanticipated, Grade 2 view noted due to anterior larynx. No significant complications noted with intubation. Positive color change noted, BBS to auscultation are equal. CXR pending.   Leigh Aurora, B.S, RRT, RCP 06/25/2018 20:35p

## 2018-06-17 NOTE — Progress Notes (Signed)
   06/30/2018 2210  Clinical Encounter Type  Visited With Family;Health care provider  Visit Type Initial;Code  Referral From Nurse  Consult/Referral To Chaplain  This chaplain was pastorally present at Pt. [place]. The chaplain introduced herself to Pt. RN-Anna.  This chaplain joined Dr. Mariane Masters during visit with the Pt. Family. The Pt. wife, daughter and son in law were present with Dr. Mariane Masters.  This chaplain is noting spiritual care was declined by the family while they are waiting on the RN to determine a time to visit the Pt.  The chaplain is available for F/U spiritual care as needed.

## 2018-06-17 NOTE — H&P (Signed)
Brief History and Physical Note:  NAME:  [name] [name]   MRN: [id number] DOB:  1940-07-18   ADMIT DATE: 06/20/2018   06/19/2018 9:37 AM  [name] [name] is a 78 y.o. male with a PMH notable for multivessel CAD-CABG and multiple PCI and PTCA rounds on the RCA (most notably for mid RCA in-stent restenosis requiring use of guide liner).  He also has essentially diet-controlled diabetes along with end-stage renal disease on hemodialysis and now antianginal management is limited by hypotension with hemodialysis.  HPI: [name] [name] was seen on May 28 for his delayed four-month follow-up indicating that he was now using close to 2 bottles of nitroglycerin in a month.  He is to the point where he "needs his cath ".  Having angina that sometimes wakes him up at night and now with minimal exertion when he usually has it with modest exertion.  He has been taking nitroglycerin more frequently now to the point of sometimes taking 2 before his symptoms were relieved.Marland Kitchen  He is also having angina occasionally with dialysis. His blood pressures continue to be low, especially during dialysis to the point where he is only able to take his metoprolol 4 days a week (Sunday, Tuesday, Thursday and Saturday).  He has been noticing quite a bit of dizziness and lightheadedness but no syncope or near syncope.  This is better since he reduced his metoprolol dosing.  As such, we have not been able to really titrate up antianginals.  No significant bleeding issues or TIA shows amaurosis fugax.  No rapid irregular heartbeats palpitations.Marland Kitchen  He came in for cardiac catheterization on June 5 which revealed modest progression of his usual mid RCA in-stent restenosis, but now a new de novo lesion in the proximal RCA that will require atherectomy.  The plan was staged atherectomy based PCI today.   Past Medical History:  Diagnosis Date  . Anemia    Likely secondary to history of GI bleed  . Anxiety   . Arthritis   . BPH (benign prostatic  hyperplasia)   . CAD (coronary artery disease) of bypass graft 2006, 2012   In 2006: Occluded SVG-OM noted (SVG-diagonal was previously occluded); 2012: Severe lesion in redo SVG-OM1 --> BMS PCI   . CAD in native artery; and the grafts 1992, 1997, 2002, 2006, 2012   CABG 1992 and redo CABG 2006  . CAD S/P percutaneous coronary angioplasty October 2012; Feb, Apr & Oct 2015; Feb 2016   a) s/p CABG 1992 - redo 2006. b) 10/12: NSTEMI: PCI -SVG-OM1 Integrity BMS 3.5 x 15 (3.85 mm) c) 2/15: UA - PCI mRCA Xience Ap DES 2.75 x 15 (3.1 mm). d) 4/15 : UA - focal dRCA Resolute DES 2.25 x 8 (2.5 mm). e) 10/15: PCI- mRCA ISR w/ post stent lesion - Promus P 2.75 x 16 (3.25 mm). f) 2/16: NSTEMI: CBA/PTCA mRCA ISR (3.5 mm); g) 7/16 ISR RCA->CBA/PTCA, -- Most recent event: 05/11/2  . Carotid arterial disease ([place])    a) Duplex 05/2012: R BULB/PROX ICA: 50-69%, L BULB/PROX ICA: 0-49%. ;; 04/29/2014: 52-84% RICA, 13-24% LICA. Patetn vertebrals,  . Chronic low back pain   . Chronic stable angina (HCC)    Chronic stable  . Colon cancer [facility]) 2003   colectomy for CA. no recurrence . never required  radiation or chem  . 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 Dyspnea    "when I get  too much Fluid"  . End stage renal disease on dialysis [facility])    Dialyzes at [facility]: Tues/Th/Sat - left upper cavity AV fistula brachiocephalic  . Gout   . History of hiatal hernia   . History of Non-ST elevated myocardial infarction (non-STEMI) 1992, 2006, 2012; 02/2013  . Hyperlipidemia   . Hypertension   . Hypothyroidism    On supplementation  . Ischemic cardiomyopathy    a) EF 45-50% by echo 2015. b) EF 50% by cath 04/2014.  Marland Kitchen LBBB (left bundle branch block)    Chronic  . Peptic ulcer disease  2004  . Plavix resistance    a) P2Y12 264 in 02/2014 - put on Brilinta but did not tolerate due to SOB. b) cannot be on Effient due to history of stroke.  c) Plavix increased to 75mg  BID and Pletal added 04/2014 due to ISR.  Marland Kitchen Pneumonia   . Skin cancer 02/1999   nose  . Stroke [facility]) 1998    Past Surgical History:  Procedure Laterality Date  . AORTIC ARCH ANGIOGRAPHY N/A 05/09/2016   Procedure: Aortic Arch Angiography;  Surgeon: Waynetta Sandy, MD;  Location: [place] CV LAB;  Service: Cardiovascular;  Laterality: N/A;  . APPENDECTOMY    . AV FISTULA REPAIR Left 05/2009  . CARDIAC CATHETERIZATION  10/16/2010   patent  LIMA to the LAD , PATENT  svg TO 2nd marginals ,occluded PLA with collaterals and SVG to the OM  had 90% stenosis  was txlge  bare - metal  3.5  Integrity ten  postdilate  36-37  mm    . CARDIAC CATHETERIZATION  03/22/2004   loss of both SVG,severe disease in prox and ostial  circ not amenable to invention. mod diease distal LAD  AFTER BYPASS GRAFT;-CVTS to evaluate   . CARDIAC CATHETERIZATION N/A 08/03/2014   Procedure: Left Heart Cath and Cors/Grafts Angiography;  Surgeon: Leonie Man, MD;  Location: [place] CV LAB;  Service: Cardiovascular;  Laterality: N/A;  . CARDIAC CATHETERIZATION N/A 08/03/2014   Procedure: Coronary Balloon Angioplasty;  Surgeon: Leonie Man, MD;  Location: [place] CV LAB;  Service: Cardiovascular;: Aggressive Cutting Balloon - [place] Balloon PTCA of ISR site in mRCA (3 stent layer)  . CAROTID DOPPLER  05/23/2012   ABN CAROTID-- RGT BULB/PROX ICA mild to mod 50-60%;lft bulb/prox mild to mod 0-49%;left subclavian abn waveforms consistent with patients lft arm A/V fistula  . CATARACT EXTRACTION W/ INTRAOCULAR LENS  IMPLANT, BILATERAL Bilateral   . CHOLECYSTECTOMY  2009  . COLON RESECTION  2003   transverse and proximal descending w/ primar anastomosis  . COLON SURGERY  2003   "cancer"  . COLONOSCOPY    . CORONARY ANGIOPLASTY  04/03/1995   OM and Circ  . CORONARY ANGIOPLASTY  02/01/2000   CIRC  . CORONARY ANGIOPLASTY  02/09/14   Cutting Balloon PTCA of mRCA ISR 99% - 3.5 mm  .  CORONARY ARTERY BYPASS GRAFT  08/22/1990   INITIAL:LIMA to LAD, SVG to  Diagonal, SVG to OM  . CORONARY ARTERY BYPASS GRAFT  2006   SVG to OM1,SVG to OM2 with patent LIMA  to LAD and occluded vein  graft to the diagonal  and occluded vein grft to OM from  prior  surgery  . CORONARY ATHERECTOMY  05/21/2016   Successful PTCA and cutting balloon atherectomy of mid dominant RCA "in-stent restenosis of the lesion that had been intervened on close to 2 years ago with an excellent result.   Marland Kitchen  CORONARY BALLOON ANGIOPLASTY N/A 05/21/2016   Procedure: Coronary Balloon Angioplasty;  Surgeon: Lorretta Harp, MD;  Location: [place] CV LAB;  Service: Cardiovascular:: Redo Cutting Balloon angioplasty RCA ISR  . DISTAL REVASCULARIZATION AND INTERVAL LIGATION (DRIL) Left 05/14/2016   Procedure: DISTAL REVASCULARIZATION AND INTERVAL LIGATION (DRIL) LEFT ARM;  Surgeon: Waynetta Sandy, MD;  Location: [place];  Service: Vascular;  Laterality: Left;  . DOPPLER ECHOCARDIOGRAPHY  01/25/2012   EF 50 to 55%  . EVENT MONITOR  01/23/2012-02/06/2012   SINUS ,LBBB,unifocal PVCs  . LEFT AND RIGHT HEART CATHETERIZATION WITH CORONARY/GRAFT ANGIOGRAM N/A 04/20/2014   Procedure: LEFT AND RIGHT HEART CATHETERIZATION WITH Beatrix Fetters;  Surgeon: Sherren Mocha, MD;  Location: [facility] CATH LAB;  Service: Cardiovascular;  Laterality: N/A;  . LEFT HEART CATH AND CORS/GRAFTS ANGIOGRAPHY N/A 05/21/2016   Procedure: LEFT HEART CATH AND CORS/GRAFTS ANGIOGRAPHY;  Surgeon: Lorretta Harp, MD;  Location: [place] CV LAB;  Service: Cardiovascular;; 95% in-stent restenosis of mid RCA treated with Cutting Balloon PTCA.  6% ramus.  Patent LIMA-LAD and SVG-OM.  Distal LAD and OM 2 -75% after graft insertion  . LEFT HEART CATH AND CORS/GRAFTS ANGIOGRAPHY N/A 06/13/2018   Procedure: LEFT HEART CATH AND CORS/GRAFTS ANGIOGRAPHY;  Surgeon: Leonie Man, MD;  Location: [place] CV LAB;  Service: Cardiovascular;  Laterality:  N/A;  . LEFT HEART CATHETERIZATION WITH CORONARY ANGIOGRAM N/A 02/23/2013   Procedure: LEFT HEART CATHETERIZATION WITH CORONARY ANGIOGRAM;  Surgeon: Lorretta Harp, MD;  Location: [facility] CATH LAB;  Service: Cardiovascular;  Laterality: N/A;  . LEFT HEART CATHETERIZATION WITH CORONARY/GRAFT ANGIOGRAM N/A 04/30/2013   Procedure: LEFT HEART CATHETERIZATION WITH Beatrix Fetters;  Surgeon: Leonie Man, MD;  Location: [facility] CATH LAB;  Service: Cardiovascular;  Laterality: N/A;  . LEFT HEART CATHETERIZATION WITH CORONARY/GRAFT ANGIOGRAM N/A 10/15/2013   Procedure: LEFT HEART CATHETERIZATION WITH Beatrix Fetters;  Surgeon: Leonie Man, MD;  Location: [facility] CATH LAB;  Service: Cardiovascular;  Laterality: N/A;  . LEFT HEART CATHETERIZATION WITH CORONARY/GRAFT ANGIOGRAM N/A 02/09/2014   Procedure: LEFT HEART CATHETERIZATION WITH Beatrix Fetters;  Surgeon: Leonie Man, MD;  Location: [facility] CATH LAB;  Service: Cardiovascular;  Laterality: N/A;  . Lower Extremity Arterial Dopplers  October 2014   Calcified but non-occlusive peripheral arteries. No evidence of stenosis  . NM MYOCAR PERF WALL MOTION  10/12/2011   EF 54%,LV normal ; no signifiant ischemia  . PERCUTANEOUS CORONARY STENT INTERVENTION (PCI-S)  02/23/2013   mid RCA 80% & 70% - Xience 2.75 mm x 15 mm ( 3.47mm) ; LIMA-LAD patent, SVG-OM patent (stent patent); SVG-Diag patent.  Marland Kitchen PERCUTANEOUS CORONARY STENT INTERVENTION (PCI-S)  April 2015   dRCA - Integrity Resolute DES   2.25 mm x 60mm (2.5 mm)  . PERCUTANEOUS CORONARY STENT INTERVENTION (PCI-S)  Oct 15 2013   [place] Angina: mRCA ISR with post-stent stenosis -- Cuting PTCA & PCI Promus Premier DES 2.75 mm x 16 mm (3.25 mm)  . POSTERIOR LUMBAR FUSION    . REVISON OF ARTERIOVENOUS FISTULA Left 36/62/9476   Procedure: PLICATION OF LEFT UPPER ARM  ARTERIOVENOUS FISTULA  ANEURYSM;  Surgeon: Rosetta Posner, MD;  Location: [place];  Service: Vascular;  Laterality: Left;  . TRANSTHORACIC  ECHOCARDIOGRAM  02/23/2013   Moderate concentric hypertrophy. EF 45-50%. Septal bounce. Grade 2 diastolic dysfunction (pseudo-normal - severely elevated filling pressures) mild to moderate MR and moderate LA dilation to  . UPPER EXTREMITY ANGIOGRAPHY Left 05/09/2016   Procedure: Upper Extremity Angiography;  Surgeon: Waynetta Sandy, MD;  Location: [place] CV LAB;  Service: Cardiovascular;  Laterality: Left;   Cardiac Cath June 13, 2018:  Severe multivessel native CAD with CTO of ostial LCx that has 3 major OM branches all occluded, LAD after diffusely diseased D1, and new ostial RCA disease with moderate in-stent restenosis of the mid RCA.  Patent LIMA-LAD and SVG-OM 2 with occluded side branch of OM 2 and essentially subtotal occlusion of OM 2 that is not PCI amenable   Despite significant CAD, relatively preserved LVEF and EDP  Diagnostic; Dominance: Right     FAMHx: Family History  Problem Relation Age of Onset  . Heart disease Mother   . Hypertension Mother   . Diabetes Mother   . Heart attack Mother   . Heart disease Father   . Hypertension Father   . Diabetes Father   . Heart attack Father   . Stroke Paternal Aunt   . Stroke Paternal Uncle   . Colon cancer Neg Hx   . Esophageal cancer Neg Hx     SOCHx:  reports that he has never smoked. He has never used smokeless tobacco. He reports that he does not drink alcohol or use drugs.  ALLERGIES: Allergies  Allergen Reactions  . Brilinta [Ticagrelor] Shortness Of Breath  . Shellfish Allergy Other (See Comments)    Causes gout flare-ups    HOME MEDICATIONS: Medications Prior to Admission  Medication Sig Dispense Refill Last Dose  . acetaminophen (TYLENOL) 500 MG tablet Take 1,000 mg by mouth 2 (two) times daily as needed for mild pain.    Past Week at Unknown time  . aspirin 81 MG chewable tablet Chew 81 mg by mouth once.   06/27/2018 at 0600  . atorvastatin (LIPITOR) 40 MG tablet Take 1 tablet (40 mg total)  by mouth daily. (Patient taking differently: Take 40 mg by mouth every evening. ) 90 tablet 3 06/16/2018 at Unknown time  . cinacalcet (SENSIPAR) 30 MG tablet Take 30 mg by mouth at bedtime.    06/16/2018 at Unknown time  . clonazePAM (KLONOPIN) 0.5 MG tablet Take 1 tablet (0.5 mg total) by mouth 2 (two) times daily as needed. for anxiety (Patient taking differently: Take 0.5 mg by mouth 2 (two) times daily as needed for anxiety. ) 60 tablet 1 06/16/2018 at Unknown time  . clopidogrel (PLAVIX) 75 MG tablet Take 1 tablet (75 mg total) by mouth daily. 90 tablet 3 06/23/2018 at 0600  . ferric citrate (AURYXIA) 1 GM 210 MG(Fe) tablet Take 210-420 mg by mouth See admin instructions. Take 420 mg with each meal and 210 mg with each snack   06/16/2018 at 0600  . isosorbide mononitrate (IMDUR) 120 MG 24 hr tablet TAKE 1 TABLET BY MOUTH ONCE DAILY (Patient taking differently: Take 120 mg by mouth daily. ) 90 tablet 3 06/15/2018 at 0600  . levothyroxine (SYNTHROID) 125 MCG tablet TAKE 1 TABLET BY MOUTH ONCE DAILY BEFORE BREAKFAST (Patient taking differently: Take 125 mcg by mouth daily before breakfast. ) 90 tablet 0 06/20/2018 at 0600  . lidocaine-prilocaine (EMLA) cream Apply 1 application topically daily as needed (port access).    Past Week at Unknown time  . Melatonin 5 MG CHEW Chew 5 mg by mouth at bedtime as needed (sleep).   06/16/2018 at Unknown time  . metoprolol succinate (TOPROL-XL) 25 MG 24 hr tablet Take 1 tablet (25 mg total) by mouth daily. (Patient taking differently: Take 25 mg by mouth See admin instructions.  Take 25 mg in the morning on Sun, Tues, Thurs, and Sat) 90 tablet 3 06/15/2018 at 0600  . nitroGLYCERIN (NITROSTAT) 0.4 MG SL tablet DISSOLVE ONE TABLET UNDER THE TONGUE EVERY 5 MINUTES AS NEEDED FOR CHEST PAIN. UP TO 3 TIMES AN EPISODE (Patient taking differently: Place 0.4 mg under the tongue every 5 (five) minutes x 3 doses as needed for chest pain. ) 25 tablet 11 06/18/2018 at 0600  . nortriptyline (PAMELOR)  25 MG capsule Take 1 capsule (25 mg total) by mouth at bedtime. 30 capsule 3 06/16/2018 at Unknown time  . nystatin (MYCOSTATIN) 100000 UNIT/ML suspension Take 5 mLs by mouth 4 (four) times daily.   06/16/2018 at Unknown time  . Besifloxacin HCl (BESIVANCE) 0.6 % SUSP Place 1 drop into both eyes See admin instructions. Use eye drops 2 times daily for 2 days following injection by Dr. Zigmund Daniel (every 10 weeks)   More than a month at Unknown time  . glucose blood (TRUE METRIX BLOOD GLUCOSE TEST) test strip 1 each by Other route as needed for other. Use as instructed to test sugars 2-3 times daily. Dx. E11.40 100 each 12 Taking    Review of Systems  Constitutional: Positive for malaise/fatigue and weight loss.  HENT: Negative for congestion.   Respiratory: Negative for cough, sputum production and shortness of breath.   Cardiovascular: Negative for claudication.  Gastrointestinal: Negative for abdominal pain, blood in stool, constipation, heartburn and melena.  Musculoskeletal: Positive for joint pain. Negative for falls and myalgias.  Neurological: Positive for dizziness and weakness (Generalized). Negative for focal weakness and headaches.  Psychiatric/Behavioral: Positive for depression and memory loss. The patient is not nervous/anxious and does not have insomnia.   All other systems reviewed and are negative.   The patient does not have symptoms concerning for COVID-19 infection (fever, chills, cough, or new shortness of breath).  The patient is practicing social distancing.   COVID-19 Education: The signs and symptoms of COVID-19 were discussed with the patient and how to seek care for testing (follow up with PCP or arrange E-visit).   The importance of social distancing was discussed today.   PHYSICAL EXAM:Blood pressure (!) 147/62, temperature (!) 97.3 F (36.3 C), temperature source Tympanic, height 5\' 5"  (1.651 m), weight 66.7 kg. General appearance: alert, cooperative, appears older  than stated age and tired/ non-toxic Lungs: clear to auscultation bilaterally and non-labored Heart: RRR, distant S1&S2.    Bruit from fistula heard.  No DustinG Abdomen: soft, non-tender; bowel sounds normal; no masses,  no organomegaly Extremities: extremities normal, atraumatic, no cyanosis or edema Pulses: Weak palpable L radia pulse with decreased pedal pulses. Skin: pale, diffuse hair loss Neurologic: Alert and oriented X 3, normal strength and tone. Normal symmetric reflexes. Normal coordination and gait   Adult ECG Report N/A  IMPRESSION & PLAN The patients' history has been reviewed, patient examined, no change in status from most recent note, stable for surgery. I have reviewed the patients' chart and labs. Questions were answered to the patient's satisfaction.    [name] [name] has presented today for Cardiac Catheterization, with the diagnosis of PROGRESSIVE ANGINA.  Principal Problem:   Progressive angina (HCC) Active Problems:   Diabetes mellitus with neuropathy ([place])   CAD S/P percutaneous coronary angioplasty   Coronary artery disease involving native coronary artery of native heart with angina pectoris ([place])   Coronary stent restenosis due to scar tissue   Hyperlipidemia with target LDL less than 70   Presence of  drug coated stent in right coronary artery   Hypotension of hemodialysis   ESRD on dialysis [facility])  The various methods of treatment have been discussed with the patient and family.   Risks / Complications include, but not limited to: Death, MI, CVA/TIA, VF/VT (with defibrillation), Bradycardia (need for temporary pacer placement), contrast induced nephropathy , bleeding / bruising / hematoma / pseudoaneurysm, vascular or coronary injury (with possible emergent CT or Vascular Surgery), adverse medication reactions, infection.     After consideration of risks, benefits and other options for treatment, the patient has consented to Procedure(s):   PERCUTANEOUS  CORONARY ATHERECTOMY / [place]  as a surgical intervention.   We will proceed with the planned procedure.    He is already on aspirin Plavix along with statin and low-dose beta-blocker.  He will undergo dialysis today after catheterization. We will continue medications as he is taking at home.   [facility] [address] [place], [place]  [address]  [phone]  06/20/2018 9:37 AM     Cath Lab Visit (complete for each Cath Lab visit)  Clinical Evaluation Leading to the Procedure:   ACS: No.  Non-ACS:    Anginal Classification: CCS IV  Anti-ischemic medical therapy: Maximal Therapy (2 or more classes of medications) --Maximum tolerated  Non-Invasive Test Results: No non-invasive testing performed  Prior CABG: Previous CABG

## 2018-06-17 NOTE — Procedures (Signed)
Arterial Catheter Insertion Procedure Note ISAIR INABINET [id number] 07-21-1940  Procedure: Insertion of Arterial Catheter  Indications: Blood pressure monitoring  Procedure Details Consent: Unable to obtain consent because of altered level of consciousness. Time Out: Verified patient identification, verified procedure, site/side was marked, verified correct patient position, special equipment/implants available, medications/allergies/relevent history reviewed, required imaging and test results available.  Performed  Maximum sterile technique was used including antiseptics, cap, gloves, gown, hand hygiene, mask and sheet. Skin prep: Chlorhexidine; local anesthetic administered 20 gauge catheter was inserted into right radial artery using the Seldinger technique. ULTRASOUND GUIDANCE USED: NO Evaluation Blood flow good; BP tracing good. Complications: No apparent complications.   Myrtie Neither 06/11/2018

## 2018-06-17 NOTE — Progress Notes (Signed)
Called Anderson Malta rn / cath lab K+ 3.2, no further orders

## 2018-06-17 NOTE — Progress Notes (Signed)
   06/16/2018 1938  Clinical Encounter Type  Visited With Health care provider  Visit Type Code;Initial  Referral From Nurse  Consult/Referral To Chaplain  This chaplain responded to Code Blue in Hemodialysis-Bay 3.  The chaplain was pastorally present with the healthcare team outside the Pt. bay.  At this time the chaplain understands from the RN, spiritual care will be contacted for F/U as needed.

## 2018-06-17 NOTE — Progress Notes (Signed)
    Called by RN to review telemetry strips concern over possible VT. Baseline EKG with 1st degree AVB with LBBB. Strips appear to be run of 15-20 VT with return on SR. Patient reported to be asymptomatic. Review of procedure log today notes he was shocked in the cath lab x1 @ 200 joules for VT. K+ noted at 3.2 this morning and does not appear this was replaced.  -- K+ 34meq x1 now -- check mag+ level -- on BB, will start amiodarone drip with load now  RN updated on plan. To call with further concerns.   SignedReino Bellis, NP-C 06/23/2018, 5:17 PM Pager: [phone]

## 2018-06-17 NOTE — Progress Notes (Signed)
Patient transferred to [facility] due to post cardiac arrest and for my high complexity monitoring. Patient was manually ventilated 100% FiO2 via ETT per Belva Chimes, RRT throughout the transport. Uneventful trip. Rapid Response RN, RRTx2 and Hemodialysis RN present during transport.  Report given to unit RRT. Patient is now in the care of 2H medical staff/team.

## 2018-06-17 NOTE — Progress Notes (Signed)
After 58mins of HD, pt noted to be asystole with a HR of 0, then switched to Vtach, code blue activated. Pt rinsed back CPR resumed.

## 2018-06-17 NOTE — Progress Notes (Signed)
Pt came back from the cath lab post stent. Pt was hooked up to monitor and noted the pt to have a wide qrs complex with a rate as high as 140. Prior EKG was similar. Pt also had complaints of 5/10 CP. SBP in the 90s so unable to give sublingual nitro. Around 1630, patient started having questionable runs of VT. Paged Ria Comment NP and received orders to give K+, check a mag level and to start an Amio gtt. Pt continued to have these runs of VT. Paged Dr. Claiborne Billings. Dr. Claiborne Billings came to the bedside to assess. EKG done. Electrolytes replaced as magnesium level came back low. 1 nitroglycerin sublingual given per Dr. Claiborne Billings. Pt CP relieved. Pt to dialysis with 4g of Mag running.   [name] [name] E

## 2018-06-17 NOTE — Progress Notes (Signed)
Notified Angie Duke, PA of patient's magnesium 1.0. Repeat magnesium level pending at this time. Received orders for 4g IV magnesium x1 and to page with repeat magnesium results. Updated patient's primary RN, Aldona Bar, with this information.

## 2018-06-17 NOTE — Progress Notes (Signed)
Patient's mag was 1.0. Ordered to give 4mg  of Mag before starting IV amio gtt per Doreene Adas, PA. Dr. Claiborne Billings also aware and patient stable enough for dialysis at this time.   [name] [name]

## 2018-06-17 NOTE — Consult Note (Addendum)
NAME:  [name] [name], MRN:  [id number], DOB:  [birth year], LOS: 0 ADMISSION DATE:  06/23/2018, CONSULTATION DATE:  07/04/2018 REFERRING MD:  Code team, CHIEF COMPLAINT:  Cardiac arrest  Brief History   78 year old male admitted for progressive angina s/p successful balloon angioplasty of the mid RCA and atherectomy based DES PCI of the ostial-proximal RCA 6/9 am.  Had ongoing VT throughout the day attributed to hypokalemia and hypomagnesemia.  Prior to [facility], he was noted to have K 7.4.  Had VT cardiac arrest shortly after starting iHD with ROSC after 20 mins.  Intubated and transferred to ICU, remains unresponsive.    History of present illness   HPI obtained from medical chart review as patient is intubated and sedated on mechanical ventilation.    78 year old male with complex history of extensive CAD s/p CABG with multiple PCI and PTCA interventions on the RCA for in-stent restenosis, Ischemic cardiomyopathy, ERSD on HD, DMT2, HTN, HLD, PUD, anemia, hypothyroidism, stroke, colon cancer s/p resection admitted 6/9 with progressive angina.   He had reported he was taking up to 2 bottles of nitroglycerin a month with angina at rest and waking him up at night with some ongoing hypotension on his delayed follow-up cardiology appointment on 5/28.  He underwent cardiac catheterization on 6/5 which showed modest progression of his mid RCA in-stent restenosis but new de novo lesion in the proximal RCA that would require staged atherectomy based PCI.  Therefore he was admitted 6/9 and underwent successful balloon angioplasty of the mid RCA and atherectomy based DES PCI of the ostial-proximal RCA; he required defibrillation x 1 during case for ventricular tachycardia.  Throughout the day he had intermittent episode of asymptomatic VT and noted to have hypokalemia and hypomagnesemia which were replaced and he was started on amiodarone drip.  He had also complained of ongoing chest pain without relief after sublingual  nitro. He was noted to have K 7.2 prior to dialysis.  He was started on iHD and after 7 minutes decompensated to VT cardiac arrest with ROSC obtained after 20 mins with ACLS measures including temporizing measures for hyperkalemia, lidocaine, amiodarone, and defibrillation x 2.  He was intubated by RT.  Post arrest, transferred to ICU.  He remained unresponsive and normotensive in SR.  PCCM consulted for further medical and ventilator management.  Nephrology has asked for hemodialysis catheter to initiate CRRT.   Past Medical History  Extensive CAD s/p CABG with multiple PCI and PTCA interventions on the RCA for in-stent restenosis, Ischemic cardiomyopathy, ERSD on HD, DMT2, HTN, HLD, PUD, anemia, hypothyroidism, stroke, colon cancer s/p resection  Significant Hospital Events   6/9 Admit/ cardiac arrest  Consults:  Cardiology Nephrology  Procedures:  6/9 ETT >> 6/9 R IJ HD cath >>  Significant Diagnostic Tests:  Cardiac Cath June 13, 2018:  Severe multivessel native CAD with CTO of ostial LCx that has 3 major OM branches all occluded, LAD after diffusely diseased D1, and new ostial RCA disease with moderate in-stent restenosis of the mid RCA.  Patent LIMA-LAD and SVG-OM 2 with occluded side branch of OM 2 and essentially subtotal occlusion of OM 2 that is not PCI amenable   Despite significant CAD, relatively preserved LVEF and EDP  6/9 LHC  >>  CULPRIT LESION: Ost RCA to Prox RCA lesion is 90% stenosed. Prox RCA lesion is 45% stenosed. -Moderate-severely calcified  A drug-eluting stent was successfully placed using a STENT RESOLUTE [id number]. Postdilated in tapered fashion  from 3.7 down to 3.2 mm  Post intervention, there is a 0% residual stenosis.  ------------------------------------------------------------  Mid RCA-2 overlapping stent segment is ~ 65% stenosed.  Scoring balloon angioplasty was performed using a BALLOON WOLVERINE 3.00X6.  Post intervention, there is a 15%  residual stenosis.  ---------------------------------------------------------------------  Dist RCA lesion is 45% stenosed. Post Atrio lesion is 100% stenosed.  Micro Data:   Antimicrobials:   Interim history/subjective:  Received no sedation since arrest.  Unresponsive but noted gag and lifting up of BUE but not localizing or seems purposeful.   Unable to pass OGT despite multiple attempts with scope by Dr. Mariane Masters -secondary to extensive postpharyngeal swelling   Objective   Blood pressure (!) 105/45, pulse 86, temperature 97.6 F (36.4 C), temperature source Oral, resp. rate 18, height 5\' 5"  (1.651 m), weight 66.7 kg, SpO2 92 %.    Vent Mode: PRVC FiO2 (%):  [100 %] 100 % Set Rate:  [16 bmp] 16 bmp Vt Set:  [500 mL] 500 mL PEEP:  [5 cmH20] 5 cmH20 Plateau Pressure:  [15 cmH20] 15 cmH20  No intake or output data in the 24 hours ending 06/23/2018 2035 Filed Weights   07/08/2018 0714  Weight: 66.7 kg   Examination: General:  Critically ill elderly male unresponsive on MV HEENT: MM pink/moist, ETT, no oGT, pupils 4/sluggish/ anicteric  Neuro:  Does not open eyes/ f/c, or withdrawal to pain.  Noted one episode of patient lifting arms up but did not localize and did not seem purposeful.  CV: RR, no murmur PULM: even/non-labored, lungs bilaterally clear, slightly diminished in R base, no wheeze PJ:KDTO, non distended, bs active  Extremities: warm/dry, no LE edema, LUE AFV with bandage Skin: no rashes   Resolved Hospital Problem list    Assessment & Plan:   VT cardiac arrest s/p recurrent VT w/ electrolyte imbalances Progressive Angina w/ Extensive CAD s/p prior CABG with multiple PCI and PTCA interventions on the RCA for in-stent restenosis s/p 6/9 Balloon angioplasty and atherectomy based DES PCI of the ostial-proximal RCA Ischemic cardiomyopathy  P:  Telemetry monitoring Currently normotensive, prn levophed for MAP goal 69 Cardiology following/ primary, will see again  tonight Amiodarone and lidocaine gtt per Cards Trend troponin q 6 and EKG Close electrolyte monitoring with TTM, goal K > 4 and Mag > 2 TTE in am  Will need to re-attempt OGT/ ?cortrak tomorrow to give PO meds- ASA/ plavix-  Assess LFTs, INR  Acute encephalopathy post cardiac arrest w/ ROSC after 20 mins P:  After discussing with attending, with caution for electrolyte changes, will proceed with TTM 33 degrees given poor neurologic exam as risk outweigh benefit at this point for any hopeful functional recovery  EEG in am  PAD protocol per TTM protocol with fentanyl/ versed/ nimbex   Acute respiratory insufficieny in the setting of cardiac arrest  P:  Full MV support, PRVC 8 cc/kg, rate 16 Wean FiO2 / PEEP for goal sat > 94% CXR post intubation satifactory placement of ETT w/ Insterstitial pulmonary edema  No SBT VAP   ESRD on iHD P:  Will place HD cath Nephrology to start CRRT q 2 BMP/ trend mag/ phos closely  DM w/hyperglycemia P:  Insulin gtt per protocol   Hx HTN P:  Hold imdur and metoprolol   HLD P:  Continue statin when able to get OGT  Reported dysphagia by family P:  Unable to place OGT by MD; considerable postpharyngeal swelling  Consider reattempt in am vs  cortrek vs ?ENT consult  Best practice:  Diet: NPO Pain/Anxiety/Delirium protocol (if indicated): fentanyl/ versed/ nimbex VAP protocol (if indicated): yes DVT prophylaxis: heparin SQ GI prophylaxis: pepcid Glucose control: insulin gtt per protocol Mobility: BR Code Status: Full  Family Communication: updated by Dr. Mariane Masters  Disposition:  ICU  Labs   CBC: Recent Labs  Lab 06/29/2018 0733 06/16/2018 2000  WBC 9.7  --   HGB 11.4* 10.5*  HCT 35.9* 31.0*  MCV 103.8*  --   PLT 253  --     Basic Metabolic Panel: Recent Labs  Lab 06/22/2018 0733 07/05/2018 0807 06/23/2018 1811 06/16/2018 2000  NA 135  --  132* 132*  K 5.9* 3.2* 7.4* 4.7  CL 93*  --  93* 95*  CO2 27  --  20*  --   GLUCOSE 89  --   211* 521*  BUN 59*  --  62* 49*  CREATININE 7.76*  --  7.93* 6.80*  CALCIUM 8.3*  --  7.8*  --   MG  --  1.0* 1.7  --    GFR: Estimated Creatinine Clearance: 7.9 mL/min (A) (by C-G formula based on SCr of 6.8 mg/dL (H)). Recent Labs  Lab 07/08/2018 0733  WBC 9.7    Liver Function Tests: No results for input(s): AST, ALT, ALKPHOS, BILITOT, PROT, ALBUMIN in the last 168 hours. No results for input(s): LIPASE, AMYLASE in the last 168 hours. No results for input(s): AMMONIA in the last 168 hours.  ABG    Component Value Date/Time   PHART 7.365 11/15/2006 1347   PCO2ART 35.1 11/15/2006 1347   PO2ART 63.0 (L) 11/15/2006 1347   HCO3 20.2 11/15/2006 1347   TCO2 23 07/02/2018 2000   ACIDBASEDEF 5.0 (H) 11/15/2006 1347   O2SAT 92.0 11/15/2006 1347     Coagulation Profile: No results for input(s): INR, PROTIME in the last 168 hours.  Cardiac Enzymes: No results for input(s): CKTOTAL, CKMB, CKMBINDEX, TROPONINI in the last 168 hours.  HbA1C: Hgb A1c MFr Bld  Date/Time Value Ref Range Status  04/11/2018 09:30 AM 5.4 4.6 - 6.5 % Final    Comment:    Glycemic Control Guidelines for People with Diabetes:Non Diabetic:  <6%Goal of Therapy: <7%Additional Action Suggested:  >8%   10/21/2017 09:25 AM 5.5 4.6 - 6.5 % Final    Comment:    Glycemic Control Guidelines for People with Diabetes:Non Diabetic:  <6%Goal of Therapy: <7%Additional Action Suggested:  >8%     CBG: Recent Labs  Lab 06/13/18 0926  GLUCAP 89    Review of Systems:   Unable  Past Medical History  He,  has a past medical history of Anemia, Anxiety, Arthritis, BPH (benign prostatic hyperplasia), CAD (coronary artery disease) of bypass graft (2006, 2012), CAD in native artery; and the grafts (1992, 1997, 2002, 2006, 2012), CAD S/P percutaneous coronary angioplasty (October 2012; Feb, Apr & Oct 2015; Feb 2016), Carotid arterial disease [facility]), Chronic low back pain, Chronic stable angina ([place]), Colon cancer ([place])  (2003), 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   Surgical History    Past Surgical History:  Procedure Laterality Date   AORTIC ARCH ANGIOGRAPHY  N/A 05/09/2016   Procedure: Aortic Arch Angiography;  Surgeon: Waynetta Sandy, MD;  Location: [place] [facility];  Service: Cardiovascular;  Laterality: N/A;   APPENDECTOMY     AV FISTULA REPAIR Left 05/2009   CARDIAC CATHETERIZATION  10/16/2010   patent  LIMA to the LAD , PATENT  svg TO 2nd marginals ,occluded PLA with collaterals and SVG to the OM  had 90% stenosis  was txlge  bare - metal  3.5  Integrity ten  postdilate  36-37  mm     CARDIAC CATHETERIZATION  03/22/2004   loss of both SVG,severe disease in prox and ostial  circ not amenable to invention. mod diease distal LAD  AFTER BYPASS GRAFT;-CVTS to evaluate    CARDIAC CATHETERIZATION N/A 08/03/2014   Procedure: Left Heart Cath and Cors/Grafts Angiography;  Surgeon: Leonie Man, MD;  Location: [place] [facility];  Service: Cardiovascular;  Laterality: N/A;   CARDIAC CATHETERIZATION N/A 08/03/2014   Procedure: Coronary Balloon Angioplasty;  Surgeon: Leonie Man, MD;  Location: [place] [facility];  Service: Cardiovascular;: Aggressive Cutting Balloon - [place] Balloon PTCA of ISR site in mRCA (3 stent layer)   CAROTID DOPPLER  05/23/2012   ABN CAROTID-- RGT BULB/PROX ICA mild to mod 50-60%;lft bulb/prox mild to mod 0-49%;left subclavian abn waveforms consistent with patients lft arm A/V fistula   CATARACT EXTRACTION W/  INTRAOCULAR LENS  IMPLANT, BILATERAL Bilateral    CHOLECYSTECTOMY  2009   COLON RESECTION  2003   transverse and proximal descending w/ primar anastomosis   COLON SURGERY  2003   "cancer"   COLONOSCOPY     CORONARY ANGIOPLASTY  04/03/1995   OM and Circ   CORONARY ANGIOPLASTY  02/01/2000   CIRC   CORONARY ANGIOPLASTY  02/09/14   Cutting Balloon PTCA of mRCA ISR 99% - 3.5 mm   CORONARY ARTERY BYPASS GRAFT  08/22/1990   INITIAL:LIMA to LAD, SVG to  Diagonal, SVG to OM   CORONARY ARTERY BYPASS GRAFT  2006   SVG to OM1,SVG to OM2 with patent LIMA  to LAD and occluded vein  graft to the diagonal  and occluded vein grft to OM from  prior  surgery   CORONARY ATHERECTOMY  05/21/2016   Successful PTCA and cutting balloon atherectomy of mid dominant RCA "in-stent restenosis of the lesion that had been intervened on close to 2 years ago with an excellent result.    CORONARY ATHERECTOMY  06/26/2018   CORONARY BALLOON ANGIOPLASTY N/A 05/21/2016   Procedure: Coronary Balloon Angioplasty;  Surgeon: Lorretta Harp, MD;  Location: [place] [facility];  Service: Cardiovascular:: [place] Cutting Balloon angioplasty RCA ISR   DISTAL REVASCULARIZATION AND INTERVAL LIGATION (DRIL) Left 05/14/2016   Procedure: DISTAL REVASCULARIZATION AND INTERVAL LIGATION (DRIL) LEFT ARM;  Surgeon: Waynetta Sandy, MD;  Location: [place];  Service: Vascular;  Laterality: Left;   DOPPLER ECHOCARDIOGRAPHY  01/25/2012   EF 50 to 55%   EVENT MONITOR  01/23/2012-02/06/2012   SINUS ,LBBB,unifocal PVCs   LEFT AND RIGHT HEART CATHETERIZATION WITH CORONARY/GRAFT ANGIOGRAM N/A 04/20/2014   Procedure: LEFT AND RIGHT HEART CATHETERIZATION WITH Beatrix Fetters;  Surgeon: Sherren Mocha, MD;  Location: [facility] CATH LAB;  Service: Cardiovascular;  Laterality: N/A;   LEFT HEART CATH AND CORS/GRAFTS ANGIOGRAPHY N/A 05/21/2016   Procedure: LEFT HEART CATH AND CORS/GRAFTS ANGIOGRAPHY;  Surgeon: Lorretta Harp, MD;   Location: [place] [facility];  Service: Cardiovascular;; 95% in-stent restenosis of mid RCA treated with Cutting  Balloon PTCA.  6% ramus.  Patent LIMA-LAD and SVG-OM.  Distal LAD and OM 2 -75% after graft insertion   LEFT HEART CATH AND CORS/GRAFTS ANGIOGRAPHY N/A 06/13/2018   Procedure: LEFT HEART CATH AND CORS/GRAFTS ANGIOGRAPHY;  Surgeon: Leonie Man, MD;  Location: [place] [facility];  Service: Cardiovascular;  Laterality: N/A;   LEFT HEART CATHETERIZATION WITH CORONARY ANGIOGRAM N/A 02/23/2013   Procedure: LEFT HEART CATHETERIZATION WITH CORONARY ANGIOGRAM;  Surgeon: Lorretta Harp, MD;  Location: [facility] CATH LAB;  Service: Cardiovascular;  Laterality: N/A;   LEFT HEART CATHETERIZATION WITH CORONARY/GRAFT ANGIOGRAM N/A 04/30/2013   Procedure: LEFT HEART CATHETERIZATION WITH Beatrix Fetters;  Surgeon: Leonie Man, MD;  Location: [facility] CATH LAB;  Service: Cardiovascular;  Laterality: N/A;   LEFT HEART CATHETERIZATION WITH CORONARY/GRAFT ANGIOGRAM N/A 10/15/2013   Procedure: LEFT HEART CATHETERIZATION WITH Beatrix Fetters;  Surgeon: Leonie Man, MD;  Location: [facility] CATH LAB;  Service: Cardiovascular;  Laterality: N/A;   LEFT HEART CATHETERIZATION WITH CORONARY/GRAFT ANGIOGRAM N/A 02/09/2014   Procedure: LEFT HEART CATHETERIZATION WITH Beatrix Fetters;  Surgeon: Leonie Man, MD;  Location: [facility] CATH LAB;  Service: Cardiovascular;  Laterality: N/A;   Lower Extremity Arterial Dopplers  October 2014   Calcified but non-occlusive peripheral arteries. No evidence of stenosis   NM MYOCAR PERF WALL MOTION  10/12/2011   EF 54%,LV normal ; no signifiant ischemia   PERCUTANEOUS CORONARY STENT INTERVENTION (PCI-S)  02/23/2013   mid RCA 80% & 70% - Xience 2.75 mm x 15 mm ( 3.42mm) ; LIMA-LAD patent, SVG-OM patent (stent patent); SVG-Diag patent.   PERCUTANEOUS CORONARY STENT INTERVENTION (PCI-S)  April 2015   dRCA - Integrity Resolute DES   2.25 mm x 24mm (2.5 mm)    PERCUTANEOUS CORONARY STENT INTERVENTION (PCI-S)  Oct 15 2013   [place] Angina: mRCA ISR with post-stent stenosis -- Cuting PTCA & PCI Promus Premier DES 2.75 mm x 16 mm (3.25 mm)   POSTERIOR LUMBAR FUSION     REVISON OF ARTERIOVENOUS FISTULA Left 67/67/2094   Procedure: PLICATION OF LEFT UPPER ARM  ARTERIOVENOUS FISTULA  ANEURYSM;  Surgeon: Rosetta Posner, MD;  Location: [facility] OR;  Service: Vascular;  Laterality: Left;   TRANSTHORACIC ECHOCARDIOGRAM  02/23/2013   Moderate concentric hypertrophy. EF 45-50%. Septal bounce. Grade 2 diastolic dysfunction (pseudo-normal - severely elevated filling pressures) mild to moderate MR and moderate LA dilation to   UPPER EXTREMITY ANGIOGRAPHY Left 05/09/2016   Procedure: Upper Extremity Angiography;  Surgeon: Waynetta Sandy, MD;  Location: [place] [facility];  Service: Cardiovascular;  Laterality: Left;     Social History   reports that he has never smoked. He has never used smokeless tobacco. He reports that he does not drink alcohol or use drugs.   Family History   His family history includes Diabetes in his father and mother; Heart attack in his father and mother; Heart disease in his father and mother; Hypertension in his father and mother; Stroke in his paternal aunt and paternal uncle. There is no history of Colon cancer or Esophageal cancer.   Allergies Allergies  Allergen Reactions   Brilinta [Ticagrelor] Shortness Of Breath   Shellfish Allergy Other (See Comments)    Causes gout flare-ups     Home Medications  Prior to Admission medications   Medication Sig Start Date End Date Taking? Authorizing Provider  acetaminophen (TYLENOL) 500 MG tablet Take 1,000 mg by mouth 2 (two) times daily as needed for mild pain.  Yes [provider]  aspirin 81 MG chewable tablet Chew 81 mg by mouth once. 06/29/2018 06/20/2018 Yes [provider]  atorvastatin (LIPITOR) 40 MG tablet Take 1 tablet (40 mg total) by mouth daily. Patient  taking differently: Take 40 mg by mouth every evening.  04/25/17  Yes Midge Minium, MD  cinacalcet (SENSIPAR) 30 MG tablet Take 30 mg by mouth at bedtime.    Yes [provider]  clonazePAM (KLONOPIN) 0.5 MG tablet Take 1 tablet (0.5 mg total) by mouth 2 (two) times daily as needed. for anxiety Patient taking differently: Take 0.5 mg by mouth 2 (two) times daily as needed for anxiety.  04/11/18  Yes Midge Minium, MD  clopidogrel (PLAVIX) 75 MG tablet Take 1 tablet (75 mg total) by mouth daily. 07/24/17  Yes Leonie Man, MD  ferric citrate (AURYXIA) 1 GM 210 MG(Fe) tablet Take 210-420 mg by mouth See admin instructions. Take 420 mg with each meal and 210 mg with each snack   Yes [provider]  isosorbide mononitrate (IMDUR) 120 MG 24 hr tablet TAKE 1 TABLET BY MOUTH ONCE DAILY Patient taking differently: Take 120 mg by mouth daily.  03/04/18  Yes Leonie Man, MD  levothyroxine (SYNTHROID) 125 MCG tablet TAKE 1 TABLET BY MOUTH ONCE DAILY BEFORE BREAKFAST Patient taking differently: Take 125 mcg by mouth daily before breakfast.  05/26/18  Yes Midge Minium, MD  lidocaine-prilocaine (EMLA) cream Apply 1 application topically daily as needed (port access).    Yes [provider]  Melatonin 5 MG CHEW Chew 5 mg by mouth at bedtime as needed (sleep).   Yes [provider]  metoprolol succinate (TOPROL-XL) 25 MG 24 hr tablet Take 1 tablet (25 mg total) by mouth daily. Patient taking differently: Take 25 mg by mouth See admin instructions. Take 25 mg in the morning on Sun, Tues, Thurs, and Sat 06/05/17  Yes Leonie Man, MD  nitroGLYCERIN (NITROSTAT) 0.4 MG SL tablet DISSOLVE ONE TABLET UNDER THE TONGUE EVERY 5 MINUTES AS NEEDED FOR CHEST PAIN. UP TO 3 TIMES AN EPISODE Patient taking differently: Place 0.4 mg under the tongue every 5 (five) minutes x 3 doses as needed for chest pain.  10/30/17  Yes Leonie Man, MD  nortriptyline (PAMELOR) 25  MG capsule Take 1 capsule (25 mg total) by mouth at bedtime. 10/21/17  Yes Midge Minium, MD  nystatin (MYCOSTATIN) 100000 UNIT/ML suspension Take 5 mLs by mouth 4 (four) times daily.   Yes [provider]  Besifloxacin HCl (BESIVANCE) 0.6 % SUSP Place 1 drop into both eyes See admin instructions. Use eye drops 2 times daily for 2 days following injection by Dr. Zigmund Daniel (every 10 weeks)    [provider]  glucose blood (TRUE METRIX BLOOD GLUCOSE TEST) test strip 1 each by Other route as needed for other. Use as instructed to test sugars 2-3 times daily. Dx. E11.40 11/05/14   Midge Minium, MD     Critical care time: 60 mins    Patient seen examined, chart reviewed and discussed at length with Mrs Moshe Cipro.   We were unable to pass ogt  Despite direct fiber optic visualization.  There appears to be a smooth soft tissue obstructing the glottis.  Family relates he has had increased difficulty swallowing for the last 2 months, choking when he swallows, may need gi to pass og.  Dr Mariane Masters MD  Kennieth Rad, MSN, AGACNP-BC [place]  Pgr: [id number] or if no answer [phone] 07/06/2018, 10:00 PM

## 2018-06-17 NOTE — Progress Notes (Signed)
Site area: Right groin a 6 french arterial and 6 french venous sheath was removed  Site Prior to Removal:  Level 0  Pressure Applied For 20 MINUTES    Bedrest Beginning at 1600p  Manual:   Yes.    Patient Status During Pull:  stable  Post Pull Groin Site:  Level 0  Post Pull Instructions Given:  Yes.    Post Pull Pulses Present:  Yes.    Dressing Applied:  Yes.    Comments:

## 2018-06-17 NOTE — Consult Note (Signed)
[facility] Renal Consultation Note    Indication for Consultation:  Management of ESRD/hemodialysis; anemia, hypertension/volume and secondary hyperparathyroidism  HPI: [name] [name] is a 78 y.o. male.  [name] [name] has a PMH significant for extensive CAD s/p CABG with multiple interventions of the RCA for in-stent restenosis, ICMP, HLD, ESRD on HD every TTS, anemia of CKD, h/o colon cancer s/p resection, MVD, and carotid artery stenosis who was admitted for elective cardiac cath due to recurrent in-stent stenosis and angina.  Cardiac cath revealed severe multivessel native CAD, new ostial RCA disease with moderate in-stent restenosis of the mid RCA, patent LIMA-LAD and SVG-OM2 with subtotal occlusion of OM2 not amenable to PCI and preserved LVEF and EDP.   He underwent successful DES of prox RCA lesion with 0% residual stenosis and PCA of mid RCA 65% to 15%.  Cath was complicated by VTach s/p 1 shock 200 J.  He was going for overnight observation and HD today after cath.  While in the HD prior to starting he had another VT arrest and code blue was called.  He was shocked and CPR initiated along with intubation and doses of epi, bicarb, Calcium, insulin, and eventually a pulse and BP were restored.  He was transferred to the CCU for further management.  We are following along to provide HD while he remains an inpatient.  He did have some abnormal, contradictory potassium levels.  This morning his K was elevated at 5.9 and then repeat was 3.2.  He apparently received 40 mEq of KCl prior to the cath and then post cath K was 7.4.  After the code his K was 4.7.  Past Medical History:  Diagnosis Date  . Anemia    Likely secondary to history of GI bleed  . Anxiety   . Arthritis   . BPH (benign prostatic hyperplasia)   . CAD (coronary artery disease) of bypass graft 2006, 2012   In 2006: Occluded SVG-OM noted (SVG-diagonal was previously occluded); 2012: Severe lesion in redo SVG-OM1 --> BMS  PCI   . CAD in native artery; and the grafts 1992, 1997, 2002, 2006, 2012   CABG 1992 and redo CABG 2006  . CAD S/P percutaneous coronary angioplasty October 2012; Feb, Apr & Oct 2015; Feb 2016   a) s/p CABG 1992 - redo 2006. b) 10/12: NSTEMI: PCI-SVG-OM1 Integrity BMS 3.5 x 15 (3.85 mm) c) 2/15: UA - PCI mRCA Xience Ap DES 2.75 x 15 (3.1 mm). d) 4/15 : UA - focal dRCA Resolute DES 2.25 x 8 (2.5 mm). e) 10/15: PCI- mRCA ISR w/ post stent - Promus P 2.75 x 16 (3.25 mm). f) 2/16: NSTEMI: CBA/PTCA mRCA ISR (3.5 mm); g) 7/16 ISR RCA->CBA/PTCA, -- Most recent event: 05/11/2: CBA of ISR.  Marland Kitchen Carotid arterial disease ([place])    a) Duplex 05/2012: R BULB/PROX ICA: 50-69%, L BULB/PROX ICA: 0-49%. ;; 04/29/2014: 51-02% RICA, 58-52% LICA. Patetn vertebrals,  . Chronic low back pain   . Chronic stable angina (HCC)    Chronic stable  . Colon cancer [facility]) 2003   colectomy for CA. no recurrence . never required  radiation or chem  . 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 Dyspnea    "when I get too much Fluid"  . End stage renal disease on dialysis [facility])    Dialyzes at [facility]: Tues/Th/Sat - left upper cavity AV fistula brachiocephalic  . Gout   .  History of hiatal hernia   . History of Non-ST elevated myocardial infarction (non-STEMI) 1992, 2006, 2012; 02/2013  . Hyperlipidemia   . Hypertension   . Hypothyroidism    On supplementation  . Ischemic cardiomyopathy    a) EF 45-50% by echo 2015. b) EF 50% by cath 04/2014.  Marland Kitchen LBBB (left bundle branch block)    Chronic  . Peptic ulcer disease  2004  . Plavix resistance    a) P2Y12 264 in 02/2014 - put on Brilinta but did not tolerate due to SOB. b) cannot be on Effient due to history of stroke. c) Plavix increased to 75mg  BID and Pletal added 04/2014 due to ISR.  Marland Kitchen Pneumonia   . Skin cancer 02/1999   nose  . Stroke [facility]) 1998   Past Surgical History:  Procedure Laterality  Date  . AORTIC ARCH ANGIOGRAPHY N/A 05/09/2016   Procedure: Aortic Arch Angiography;  Surgeon: Waynetta Sandy, MD;  Location: [place] CV LAB;  Service: Cardiovascular;  Laterality: N/A;  . APPENDECTOMY    . AV FISTULA REPAIR Left 05/2009  . CARDIAC CATHETERIZATION  10/16/2010   patent  LIMA to the LAD , PATENT  svg TO 2nd marginals ,occluded PLA with collaterals and SVG to the OM  had 90% stenosis  was txlge  bare - metal  3.5  Integrity ten  postdilate  36-37  mm    . CARDIAC CATHETERIZATION  03/22/2004   loss of both SVG,severe disease in prox and ostial  circ not amenable to invention. mod diease distal LAD  AFTER BYPASS GRAFT;-CVTS to evaluate   . CARDIAC CATHETERIZATION N/A 08/03/2014   Procedure: Left Heart Cath and Cors/Grafts Angiography;  Surgeon: Leonie Man, MD;  Location: [place] CV LAB;  Service: Cardiovascular;  Laterality: N/A;  . CARDIAC CATHETERIZATION N/A 08/03/2014   Procedure: Coronary Balloon Angioplasty;  Surgeon: Leonie Man, MD;  Location: [place] CV LAB;  Service: Cardiovascular;: Aggressive Cutting Balloon - Wisner Balloon PTCA of ISR site in mRCA (3 stent layer)  . CAROTID DOPPLER  05/23/2012   ABN CAROTID-- RGT BULB/PROX ICA mild to mod 50-60%;lft bulb/prox mild to mod 0-49%;left subclavian abn waveforms consistent with patients lft arm A/V fistula  . CATARACT EXTRACTION W/ INTRAOCULAR LENS  IMPLANT, BILATERAL Bilateral   . CHOLECYSTECTOMY  2009  . COLON RESECTION  2003   transverse and proximal descending w/ primar anastomosis  . COLON SURGERY  2003   "cancer"  . COLONOSCOPY    . CORONARY ANGIOPLASTY  04/03/1995   OM and Circ  . CORONARY ANGIOPLASTY  02/01/2000   CIRC  . CORONARY ANGIOPLASTY  02/09/14   Cutting Balloon PTCA of mRCA ISR 99% - 3.5 mm  . CORONARY ARTERY BYPASS GRAFT  08/22/1990   INITIAL:LIMA to LAD, SVG to  Diagonal, SVG to OM  . CORONARY ARTERY BYPASS GRAFT  2006   SVG to OM1,SVG to OM2 with patent LIMA  to LAD and occluded  vein  graft to the diagonal  and occluded vein grft to OM from  prior  surgery  . CORONARY ATHERECTOMY  05/21/2016   Successful PTCA and cutting balloon atherectomy of mid dominant RCA "in-stent restenosis of the lesion that had been intervened on close to 2 years ago with an excellent result.   . CORONARY ATHERECTOMY  06/16/2018  . CORONARY BALLOON ANGIOPLASTY N/A 05/21/2016   Procedure: Coronary Balloon Angioplasty;  Surgeon: Lorretta Harp, MD;  Location: [place] CV LAB;  Service: Cardiovascular::  Redo Cutting Balloon angioplasty RCA ISR  . DISTAL REVASCULARIZATION AND INTERVAL LIGATION (DRIL) Left 05/14/2016   Procedure: DISTAL REVASCULARIZATION AND INTERVAL LIGATION (DRIL) LEFT ARM;  Surgeon: Waynetta Sandy, MD;  Location: [place];  Service: Vascular;  Laterality: Left;  . DOPPLER ECHOCARDIOGRAPHY  01/25/2012   EF 50 to 55%  . EVENT MONITOR  01/23/2012-02/06/2012   SINUS ,LBBB,unifocal PVCs  . LEFT AND RIGHT HEART CATHETERIZATION WITH CORONARY/GRAFT ANGIOGRAM N/A 04/20/2014   Procedure: LEFT AND RIGHT HEART CATHETERIZATION WITH Beatrix Fetters;  Surgeon: Sherren Mocha, MD;  Location: [facility] CATH LAB;  Service: Cardiovascular;  Laterality: N/A;  . LEFT HEART CATH AND CORS/GRAFTS ANGIOGRAPHY N/A 05/21/2016   Procedure: LEFT HEART CATH AND CORS/GRAFTS ANGIOGRAPHY;  Surgeon: Lorretta Harp, MD;  Location: [place] CV LAB;  Service: Cardiovascular;; 95% in-stent restenosis of mid RCA treated with Cutting Balloon PTCA.  6% ramus.  Patent LIMA-LAD and SVG-OM.  Distal LAD and OM 2 -75% after graft insertion  . LEFT HEART CATH AND CORS/GRAFTS ANGIOGRAPHY N/A 06/13/2018   Procedure: LEFT HEART CATH AND CORS/GRAFTS ANGIOGRAPHY;  Surgeon: Leonie Man, MD;  Location: [place] CV LAB;  Service: Cardiovascular;  Laterality: N/A;  . LEFT HEART CATHETERIZATION WITH CORONARY ANGIOGRAM N/A 02/23/2013   Procedure: LEFT HEART CATHETERIZATION WITH CORONARY ANGIOGRAM;  Surgeon: Lorretta Harp, MD;  Location: [facility] CATH LAB;  Service: Cardiovascular;  Laterality: N/A;  . LEFT HEART CATHETERIZATION WITH CORONARY/GRAFT ANGIOGRAM N/A 04/30/2013   Procedure: LEFT HEART CATHETERIZATION WITH Beatrix Fetters;  Surgeon: Leonie Man, MD;  Location: [facility] CATH LAB;  Service: Cardiovascular;  Laterality: N/A;  . LEFT HEART CATHETERIZATION WITH CORONARY/GRAFT ANGIOGRAM N/A 10/15/2013   Procedure: LEFT HEART CATHETERIZATION WITH Beatrix Fetters;  Surgeon: Leonie Man, MD;  Location: [facility] CATH LAB;  Service: Cardiovascular;  Laterality: N/A;  . LEFT HEART CATHETERIZATION WITH CORONARY/GRAFT ANGIOGRAM N/A 02/09/2014   Procedure: LEFT HEART CATHETERIZATION WITH Beatrix Fetters;  Surgeon: Leonie Man, MD;  Location: [facility] CATH LAB;  Service: Cardiovascular;  Laterality: N/A;  . Lower Extremity Arterial Dopplers  October 2014   Calcified but non-occlusive peripheral arteries. No evidence of stenosis  . NM MYOCAR PERF WALL MOTION  10/12/2011   EF 54%,LV normal ; no signifiant ischemia  . PERCUTANEOUS CORONARY STENT INTERVENTION (PCI-S)  02/23/2013   mid RCA 80% & 70% - Xience 2.75 mm x 15 mm ( 3.68mm) ; LIMA-LAD patent, SVG-OM patent (stent patent); SVG-Diag patent.  Marland Kitchen PERCUTANEOUS CORONARY STENT INTERVENTION (PCI-S)  April 2015   dRCA - Integrity Resolute DES   2.25 mm x 54mm (2.5 mm)  . PERCUTANEOUS CORONARY STENT INTERVENTION (PCI-S)  Oct 15 2013   [place] Angina: mRCA ISR with post-stent stenosis -- Cuting PTCA & PCI Promus Premier DES 2.75 mm x 16 mm (3.25 mm)  . POSTERIOR LUMBAR FUSION    . REVISON OF ARTERIOVENOUS FISTULA Left 69/62/9528   Procedure: PLICATION OF LEFT UPPER ARM  ARTERIOVENOUS FISTULA  ANEURYSM;  Surgeon: Rosetta Posner, MD;  Location: [place];  Service: Vascular;  Laterality: Left;  . TRANSTHORACIC ECHOCARDIOGRAM  02/23/2013   Moderate concentric hypertrophy. EF 45-50%. Septal bounce. Grade 2 diastolic dysfunction (pseudo-normal - severely elevated filling  pressures) mild to moderate MR and moderate LA dilation to  . UPPER EXTREMITY ANGIOGRAPHY Left 05/09/2016   Procedure: Upper Extremity Angiography;  Surgeon: Waynetta Sandy, MD;  Location: [place] CV LAB;  Service: Cardiovascular;  Laterality: Left;   Family History:   Family History  Problem Relation Age of Onset  . Heart disease Mother   . Hypertension Mother   . Diabetes Mother   . Heart attack Mother   . Heart disease Father   . Hypertension Father   . Diabetes Father   . Heart attack Father   . Stroke Paternal Aunt   . Stroke Paternal Uncle   . Colon cancer Neg Hx   . Esophageal cancer Neg Hx    Social History:  reports that he has never smoked. He has never used smokeless tobacco. He reports that he does not drink alcohol or use drugs. Allergies  Allergen Reactions  . Brilinta [Ticagrelor] Shortness Of Breath  . Shellfish Allergy Other (See Comments)    Causes gout flare-ups   Prior to Admission medications   Medication Sig Start Date End Date Taking? Authorizing Provider  acetaminophen (TYLENOL) 500 MG tablet Take 1,000 mg by mouth 2 (two) times daily as needed for mild pain.    Yes [provider]  aspirin 81 MG chewable tablet Chew 81 mg by mouth once. 06/15/2018 06/10/2018 Yes [provider]  atorvastatin (LIPITOR) 40 MG tablet Take 1 tablet (40 mg total) by mouth daily. Patient taking differently: Take 40 mg by mouth every evening.  04/25/17  Yes Midge Minium, MD  cinacalcet (SENSIPAR) 30 MG tablet Take 30 mg by mouth at bedtime.    Yes [provider]  clonazePAM (KLONOPIN) 0.5 MG tablet Take 1 tablet (0.5 mg total) by mouth 2 (two) times daily as needed. for anxiety Patient taking differently: Take 0.5 mg by mouth 2 (two) times daily as needed for anxiety.  04/11/18  Yes Midge Minium, MD  clopidogrel (PLAVIX) 75 MG tablet Take 1 tablet (75 mg total) by mouth daily. 07/24/17  Yes Leonie Man, MD  ferric citrate (AURYXIA)  1 GM 210 MG(Fe) tablet Take 210-420 mg by mouth See admin instructions. Take 420 mg with each meal and 210 mg with each snack   Yes [provider]  isosorbide mononitrate (IMDUR) 120 MG 24 hr tablet TAKE 1 TABLET BY MOUTH ONCE DAILY Patient taking differently: Take 120 mg by mouth daily.  03/04/18  Yes Leonie Man, MD  levothyroxine (SYNTHROID) 125 MCG tablet TAKE 1 TABLET BY MOUTH ONCE DAILY BEFORE BREAKFAST Patient taking differently: Take 125 mcg by mouth daily before breakfast.  05/26/18  Yes Midge Minium, MD  lidocaine-prilocaine (EMLA) cream Apply 1 application topically daily as needed (port access).    Yes [provider]  Melatonin 5 MG CHEW Chew 5 mg by mouth at bedtime as needed (sleep).   Yes [provider]  metoprolol succinate (TOPROL-XL) 25 MG 24 hr tablet Take 1 tablet (25 mg total) by mouth daily. Patient taking differently: Take 25 mg by mouth See admin instructions. Take 25 mg in the morning on Sun, Tues, Thurs, and Sat 06/05/17  Yes Leonie Man, MD  nitroGLYCERIN (NITROSTAT) 0.4 MG SL tablet DISSOLVE ONE TABLET UNDER THE TONGUE EVERY 5 MINUTES AS NEEDED FOR CHEST PAIN. UP TO 3 TIMES AN EPISODE Patient taking differently: Place 0.4 mg under the tongue every 5 (five) minutes x 3 doses as needed for chest pain.  10/30/17  Yes Leonie Man, MD  nortriptyline (PAMELOR) 25 MG capsule Take 1 capsule (25 mg total) by mouth at bedtime. 10/21/17  Yes Midge Minium, MD  nystatin (MYCOSTATIN) 100000 UNIT/ML suspension Take 5 mLs by mouth 4 (four) times daily.   Yes [provider]  Besifloxacin HCl (BESIVANCE) 0.6 % SUSP Place 1 drop into both eyes See admin instructions. Use eye drops 2 times daily for 2 days following injection by Dr. Zigmund Daniel (every 10 weeks)    [provider]  glucose blood (TRUE METRIX BLOOD GLUCOSE TEST) test strip 1 each by Other route as needed for other. Use as instructed to test sugars 2-3 times  daily. Dx. E11.40 11/05/14   Midge Minium, MD   Current Facility-Administered Medications  Medication Dose Route Frequency Provider Last Rate Last Dose  . 0.9 %  sodium chloride infusion  250 mL Intravenous PRN Leonie Man, MD      . acetaminophen (TYLENOL) tablet 1,000 mg  1,000 mg Oral BID PRN Leonie Man, MD      . amiodarone (NEXTERONE) 1.8 mg/mL load via infusion 150 mg  150 mg Intravenous Once Reino Bellis B, NP       Followed by  . amiodarone (NEXTERONE PREMIX) 360-4.14 MG/200ML-% (1.8 mg/mL) IV infusion  60 mg/hr Intravenous Continuous Reino Bellis B, NP       Followed by  . amiodarone (NEXTERONE PREMIX) 360-4.14 MG/200ML-% (1.8 mg/mL) IV infusion  30 mg/hr Intravenous Continuous Reino Bellis B, NP      . aspirin chewable tablet 81 mg  81 mg Oral Once Leonie Man, MD      . atorvastatin (LIPITOR) tablet 40 mg  40 mg Oral QPM Leonie Man, MD      . Derrill Memo ON 06/18/2018] Chlorhexidine Gluconate Cloth 2 % PADS 6 each  6 each Topical Q0600 Alric Seton, PA-C      . cinacalcet (SENSIPAR) tablet 30 mg  30 mg Oral Q supper Leonie Man, MD   30 mg at 06/12/2018 1800  . clonazePAM (KLONOPIN) tablet 0.5 mg  0.5 mg Oral BID PRN Leonie Man, MD      . Derrill Memo ON 06/18/2018] clopidogrel (PLAVIX) tablet 75 mg  75 mg Oral Daily Leonie Man, MD      . doxercalciferol (HECTOROL) injection 1 mcg  1 mcg Intravenous Q T,Th,Sa-HD Alric Seton, PA-C      . ferric citrate (AURYXIA) tablet 420 mg  420 mg Oral TID WC Leonie Man, MD   420 mg at 07/01/2018 1758   And  . ferric citrate (AURYXIA) tablet 210 mg  210 mg Oral BID PRN Leonie Man, MD      . Derrill Memo ON 06/18/2018] isosorbide mononitrate (IMDUR) 24 hr tablet 120 mg  120 mg Oral Daily Leonie Man, MD      . Derrill Memo ON 06/18/2018] levothyroxine (SYNTHROID) tablet 125 mcg  125 mcg Oral QAC breakfast Leonie Man, MD      . lidocaine (cardiac) 2000 mg in dextrose 5% 500 mL (4mg /mL) IV  infusion  0.5 mg/min Intravenous Continuous Helberg, Justin, MD      . lidocaine-prilocaine (EMLA) cream 1 application  1 application Topical Daily PRN Leonie Man, MD      . magnesium sulfate IVPB 4 g 100 mL  4 g Intravenous Once Ledora Bottcher, PA 50 mL/hr at 06/26/2018 1838 4 g at 06/16/2018 1838  . Melatonin TABS 6 mg  6 mg Oral QHS PRN Pat Patrick, RPH      . [START ON 06/19/2018] metoprolol succinate (TOPROL-XL) 24 hr tablet 25 mg  25 mg Oral Once per day on Sun Tue Thu Sat Leonie Man, MD      .  nitroGLYCERIN (NITROSTAT) SL tablet 0.4 mg  0.4 mg Sublingual Q5 Min x 3 PRN Leonie Man, MD   0.4 mg at 07/01/2018 1756  . nortriptyline (PAMELOR) capsule 25 mg  25 mg Oral QHS Leonie Man, MD      . nystatin (MYCOSTATIN) 100000 UNIT/ML suspension 500,000 Units  5 mL Oral QID Leonie Man, MD   500,000 Units at 07/02/2018 1758  . ondansetron (ZOFRAN) injection 4 mg  4 mg Intravenous Q6H PRN Leonie Man, MD      . sodium chloride flush (NS) 0.9 % injection 3 mL  3 mL Intravenous Q12H Leonie Man, MD      . sodium chloride flush (NS) 0.9 % injection 3 mL  3 mL Intravenous PRN Leonie Man, MD       Labs: Basic Metabolic Panel: Recent Labs  Lab 06/16/2018 0733 06/21/2018 0807 07/02/2018 1811  NA 135  --  132*  K 5.9* 3.2* 7.4*  CL 93*  --  93*  CO2 27  --  20*  GLUCOSE 89  --  211*  BUN 59*  --  62*  CREATININE 7.76*  --  7.93*  CALCIUM 8.3*  --  7.8*   Liver Function Tests: No results for input(s): AST, ALT, ALKPHOS, BILITOT, PROT, ALBUMIN in the last 168 hours. No results for input(s): LIPASE, AMYLASE in the last 168 hours. No results for input(s): AMMONIA in the last 168 hours. CBC: Recent Labs  Lab 07/01/2018 0733  WBC 9.7  HGB 11.4*  HCT 35.9*  MCV 103.8*  PLT 253   Cardiac Enzymes: No results for input(s): CKTOTAL, CKMB, CKMBINDEX, TROPONINI in the last 168 hours. CBG: Recent Labs  Lab 06/13/18 0926  GLUCAP 89   Iron Studies: No  results for input(s): IRON, TIBC, TRANSFERRIN, FERRITIN in the last 72 hours. Studies/Results: No results found.  ROS: Review of systems not obtained due to patient factors. Physical Exam: Vitals:   06/21/2018 1745 06/27/2018 1750 06/22/2018 1800 06/15/2018 1805  BP: (!) 89/35 (!) 98/34 (!) 95/24 (!) 105/45  Pulse: 86 88 86 86  Resp:  16 14 18   Temp:      TempSrc:      SpO2: 99% 100% 95% 92%  Weight:      Height:          Weight change:  No intake or output data in the 24 hours ending 06/27/2018 2002 BP (!) 105/45   Pulse 86   Temp 97.6 F (36.4 C) (Oral)   Resp 18   Ht 5\' 5"  (1.651 m)   Wt 66.7 kg   SpO2 92%   BMI 24.46 kg/m  General appearance: Intubated and unresponsive Head: Normocephalic, without obvious abnormality, atraumatic Resp: rhonchi bilaterally Cardio: tachy no rub GI: soft, non-tender; bowel sounds normal; no masses,  no organomegaly Extremities: cool to touch, no edema, LUE AVF +T/B Dialysis Access: LUE AVF +T/B  Dialysis Orders: Center: [facility]  on TTS . EDW 63.5 HD Bath 2K/2.25Ca  Time 4 hrs Heparin 3000. Access LUE AVF BFR 350 DFR 800    Hectoral 1 mcg IV/HD Micera 30 mcg IV every 6 weeks (last given 05/13/18), UF profile 4 Sodium model:  Linear 148  Assessment/Plan: 1.  VT arrest- s/p shock, epi, bicarb, IV calcium, CPR, now with pulse and BP.  Transfer to CCU for cardiology evluation 2. Multivessel CAD- s/p intervention of 2 lesions in RCA and DES placed in prox RCA.  Cardiology following.  3. ESRD -   Too unstable for HD will consult PCCM for trialysis catheter and plan for CVVHD as he will likely have hyperkalemia  4.  Hypertension/volume  - hypotensive now on pressors 5.  Anemia  -  Follow H/H 6.  Metabolic bone disease -  stable 7.  Nutrition - npo for now.  Donetta Potts, MD [place] Pager [phone] 07/06/2018, 8:02 PM

## 2018-06-17 NOTE — Procedures (Signed)
Triple dialysis catheter AXAVIER PRESSLEY [id number] October 23, 1940  Procedure: Insertion of Central Venous Catheter Indications: Frequent blood sampling  Procedure Details Consent: Unable to obtain consent because of emergent medical necessity. Time Out: Verified patient identification, verified procedure, site/side was marked, verified correct patient position, special equipment/implants available, medications/allergies/relevent history reviewed, required imaging and test results available.  Performed  Maximum sterile technique was used including antiseptics, cap, gloves, gown, hand hygiene, mask and sheet. Skin prep: Chlorhexidine; local anesthetic administered A antimicrobial bonded/coated triple lumen catheter was placed in the right external jugular vein using the Seldinger technique.  Evaluation Blood flow good Complications: No apparent complications Patient did tolerate procedure well. Chest X-ray ordered to verify placement.  CXR: normal.  [name] 06/18/2018, 10:04 PM

## 2018-06-17 NOTE — Progress Notes (Signed)
Called by nurse concerning Pt. He underwent atherectomy of RCA this am and hase significant MVD. He is on dialysis with potential dialysis this evening. Telemetry demonstrates a significant conduction defect with several bursts of NSVT. [facility] now at bedside stabel. K earlier today was 3.2. Pt sates he did not note any significant improvement in his chest pain which typically is nitrate responsive post procedure. BP had been ~ 90 systolic, now ~ 383. Will give 1 sl NTG. As per Harlan Stains, started on amiodarone for NSVT. Will re-check lab.  Shelva Majestic, MD

## 2018-06-17 NOTE — Code Documentation (Signed)
  Patient Name: [name] [name]   MRN: [id number]   Date of Birth/ Sex: [birth year] , male      Admission Date: 06/16/2018  Attending Provider: Leonie Man, MD  Primary Diagnosis: Progressive angina [facility]) [I20.0]   Indication: Pt was in his usual state of health until this PM, when he was noted to be unresponsive. Code blue was subsequently called. At the time of arrival on scene, ACLS protocol was underway.   Technical Description:  - CPR performance duration:  20 minute  - Was defibrillation or cardioversion used? Yes   - Was external pacer placed? No  - Was patient intubated pre/post CPR? Yes   Medications Administered: Y = Yes; Blank = No Amiodarone  Y  Atropine    Calcium  Y  Epinephrine  Y  Lidocaine  Y  Magnesium  Y  Norepinephrine    Phenylephrine    Sodium bicarbonate  Y  Vasopressin    Other    Post CPR evaluation:  - Final Status - Was patient successfully resuscitated ? Yes  Miscellaneous Information:     - Primary team notified?  Yes  - Family Notified? No     Matilde Haymaker, MD   06/27/2018, 8:22 PM

## 2018-06-17 NOTE — Progress Notes (Signed)
[facility] Progress Note Patient Name: [name] [name] DOB: [birth year] MRN: [id number]   Date of Service  06/21/2018  HPI/Events of Note  Nursing request for A-line.  eICU Interventions  Will order: 1. RT to place A-line.      Intervention Category Major Interventions: Other:  Lysle Dingwall 06/12/2018, 10:31 PM

## 2018-06-18 ENCOUNTER — Ambulatory Visit (HOSPITAL_COMMUNITY): Payer: Medicare Other

## 2018-06-18 ENCOUNTER — Inpatient Hospital Stay (HOSPITAL_COMMUNITY): Payer: Medicare Other

## 2018-06-18 ENCOUNTER — Encounter (HOSPITAL_COMMUNITY): Payer: Self-pay | Admitting: Cardiology

## 2018-06-18 DIAGNOSIS — I2510 Atherosclerotic heart disease of native coronary artery without angina pectoris: Secondary | ICD-10-CM | POA: Diagnosis not present

## 2018-06-18 DIAGNOSIS — N186 End stage renal disease: Secondary | ICD-10-CM | POA: Diagnosis present

## 2018-06-18 DIAGNOSIS — J9601 Acute respiratory failure with hypoxia: Secondary | ICD-10-CM | POA: Diagnosis not present

## 2018-06-18 DIAGNOSIS — E0840 Diabetes mellitus due to underlying condition with diabetic neuropathy, unspecified: Secondary | ICD-10-CM | POA: Diagnosis not present

## 2018-06-18 DIAGNOSIS — I472 Ventricular tachycardia, unspecified: Secondary | ICD-10-CM

## 2018-06-18 DIAGNOSIS — I462 Cardiac arrest due to underlying cardiac condition: Secondary | ICD-10-CM | POA: Diagnosis not present

## 2018-06-18 DIAGNOSIS — Y831 Surgical operation with implant of artificial internal device as the cause of abnormal reaction of the patient, or of later complication, without mention of misadventure at the time of the procedure: Secondary | ICD-10-CM | POA: Diagnosis present

## 2018-06-18 DIAGNOSIS — I132 Hypertensive heart and chronic kidney disease with heart failure and with stage 5 chronic kidney disease, or end stage renal disease: Secondary | ICD-10-CM | POA: Diagnosis present

## 2018-06-18 DIAGNOSIS — N2581 Secondary hyperparathyroidism of renal origin: Secondary | ICD-10-CM | POA: Diagnosis present

## 2018-06-18 DIAGNOSIS — F419 Anxiety disorder, unspecified: Secondary | ICD-10-CM | POA: Diagnosis present

## 2018-06-18 DIAGNOSIS — I200 Unstable angina: Secondary | ICD-10-CM | POA: Diagnosis not present

## 2018-06-18 DIAGNOSIS — Z955 Presence of coronary angioplasty implant and graft: Secondary | ICD-10-CM

## 2018-06-18 DIAGNOSIS — Z9861 Coronary angioplasty status: Secondary | ICD-10-CM

## 2018-06-18 DIAGNOSIS — E1122 Type 2 diabetes mellitus with diabetic chronic kidney disease: Secondary | ICD-10-CM | POA: Diagnosis present

## 2018-06-18 DIAGNOSIS — I953 Hypotension of hemodialysis: Secondary | ICD-10-CM

## 2018-06-18 DIAGNOSIS — E785 Hyperlipidemia, unspecified: Secondary | ICD-10-CM | POA: Diagnosis present

## 2018-06-18 DIAGNOSIS — E1140 Type 2 diabetes mellitus with diabetic neuropathy, unspecified: Secondary | ICD-10-CM | POA: Diagnosis present

## 2018-06-18 DIAGNOSIS — J9600 Acute respiratory failure, unspecified whether with hypoxia or hypercapnia: Secondary | ICD-10-CM | POA: Diagnosis not present

## 2018-06-18 DIAGNOSIS — E876 Hypokalemia: Secondary | ICD-10-CM | POA: Diagnosis not present

## 2018-06-18 DIAGNOSIS — I5041 Acute combined systolic (congestive) and diastolic (congestive) heart failure: Secondary | ICD-10-CM | POA: Diagnosis not present

## 2018-06-18 DIAGNOSIS — I25119 Atherosclerotic heart disease of native coronary artery with unspecified angina pectoris: Secondary | ICD-10-CM

## 2018-06-18 DIAGNOSIS — G931 Anoxic brain damage, not elsewhere classified: Secondary | ICD-10-CM | POA: Diagnosis not present

## 2018-06-18 DIAGNOSIS — I25110 Atherosclerotic heart disease of native coronary artery with unstable angina pectoris: Secondary | ICD-10-CM | POA: Diagnosis present

## 2018-06-18 DIAGNOSIS — I469 Cardiac arrest, cause unspecified: Secondary | ICD-10-CM | POA: Diagnosis not present

## 2018-06-18 DIAGNOSIS — Z992 Dependence on renal dialysis: Secondary | ICD-10-CM

## 2018-06-18 DIAGNOSIS — I252 Old myocardial infarction: Secondary | ICD-10-CM | POA: Diagnosis not present

## 2018-06-18 DIAGNOSIS — I340 Nonrheumatic mitral (valve) insufficiency: Secondary | ICD-10-CM | POA: Diagnosis not present

## 2018-06-18 DIAGNOSIS — K7200 Acute and subacute hepatic failure without coma: Secondary | ICD-10-CM | POA: Diagnosis not present

## 2018-06-18 DIAGNOSIS — I255 Ischemic cardiomyopathy: Secondary | ICD-10-CM | POA: Diagnosis present

## 2018-06-18 DIAGNOSIS — I447 Left bundle-branch block, unspecified: Secondary | ICD-10-CM | POA: Diagnosis present

## 2018-06-18 DIAGNOSIS — G9341 Metabolic encephalopathy: Secondary | ICD-10-CM | POA: Diagnosis not present

## 2018-06-18 DIAGNOSIS — G8929 Other chronic pain: Secondary | ICD-10-CM | POA: Diagnosis present

## 2018-06-18 DIAGNOSIS — R680 Hypothermia, not associated with low environmental temperature: Secondary | ICD-10-CM | POA: Diagnosis not present

## 2018-06-18 DIAGNOSIS — N400 Enlarged prostate without lower urinary tract symptoms: Secondary | ICD-10-CM | POA: Diagnosis present

## 2018-06-18 DIAGNOSIS — R570 Cardiogenic shock: Secondary | ICD-10-CM | POA: Diagnosis not present

## 2018-06-18 DIAGNOSIS — E1165 Type 2 diabetes mellitus with hyperglycemia: Secondary | ICD-10-CM | POA: Diagnosis present

## 2018-06-18 DIAGNOSIS — I5032 Chronic diastolic (congestive) heart failure: Secondary | ICD-10-CM | POA: Diagnosis present

## 2018-06-18 LAB — CBC
HCT: 34.1 % — ABNORMAL LOW (ref 39.0–52.0)
Hemoglobin: 11.3 g/dL — ABNORMAL LOW (ref 13.0–17.0)
MCH: 33.2 pg (ref 26.0–34.0)
MCHC: 33.1 g/dL (ref 30.0–36.0)
MCV: 100.3 fL — ABNORMAL HIGH (ref 80.0–100.0)
Platelets: 260 10*3/uL (ref 150–400)
RBC: 3.4 MIL/uL — ABNORMAL LOW (ref 4.22–5.81)
RDW: 13.8 % (ref 11.5–15.5)
WBC: 20 10*3/uL — ABNORMAL HIGH (ref 4.0–10.5)
nRBC: 0 % (ref 0.0–0.2)

## 2018-06-18 LAB — LIDOCAINE LEVEL: Lidocaine Lvl: 1.4 ug/mL — ABNORMAL LOW (ref 1.5–5.0)

## 2018-06-18 LAB — BLOOD GAS, ARTERIAL
Acid-base deficit: 1.2 mmol/L (ref 0.0–2.0)
Bicarbonate: 22.3 mmol/L (ref 20.0–28.0)
FIO2: 0.8
MECHVT: 500 mL
O2 Saturation: 99.2 %
PEEP: 5 cmH2O
Patient temperature: 98.6
RATE: 16 resp/min
pCO2 arterial: 33.5 mmHg (ref 32.0–48.0)
pH, Arterial: 7.439 (ref 7.350–7.450)
pO2, Arterial: 216 mmHg — ABNORMAL HIGH (ref 83.0–108.0)

## 2018-06-18 LAB — GLUCOSE, CAPILLARY
Glucose-Capillary: 228 mg/dL — ABNORMAL HIGH (ref 70–99)
Glucose-Capillary: 242 mg/dL — ABNORMAL HIGH (ref 70–99)
Glucose-Capillary: 259 mg/dL — ABNORMAL HIGH (ref 70–99)
Glucose-Capillary: 260 mg/dL — ABNORMAL HIGH (ref 70–99)
Glucose-Capillary: 275 mg/dL — ABNORMAL HIGH (ref 70–99)
Glucose-Capillary: 276 mg/dL — ABNORMAL HIGH (ref 70–99)
Glucose-Capillary: 277 mg/dL — ABNORMAL HIGH (ref 70–99)
Glucose-Capillary: 176 mg/dL — ABNORMAL HIGH (ref 70–99)
Glucose-Capillary: 185 mg/dL — ABNORMAL HIGH (ref 70–99)
Glucose-Capillary: 137 mg/dL — ABNORMAL HIGH (ref 70–99)
Glucose-Capillary: 128 mg/dL — ABNORMAL HIGH (ref 70–99)
Glucose-Capillary: 123 mg/dL — ABNORMAL HIGH (ref 70–99)
Glucose-Capillary: 109 mg/dL — ABNORMAL HIGH (ref 70–99)
Glucose-Capillary: 104 mg/dL — ABNORMAL HIGH (ref 70–99)
Glucose-Capillary: 97 mg/dL (ref 70–99)
Glucose-Capillary: 106 mg/dL — ABNORMAL HIGH (ref 70–99)
Glucose-Capillary: 121 mg/dL — ABNORMAL HIGH (ref 70–99)
Glucose-Capillary: 127 mg/dL — ABNORMAL HIGH (ref 70–99)
Glucose-Capillary: 137 mg/dL — ABNORMAL HIGH (ref 70–99)
Glucose-Capillary: 147 mg/dL — ABNORMAL HIGH (ref 70–99)

## 2018-06-18 LAB — RENAL FUNCTION PANEL
Albumin: 2.8 g/dL — ABNORMAL LOW (ref 3.5–5.0)
Anion gap: 14 (ref 5–15)
BUN: 54 mg/dL — ABNORMAL HIGH (ref 8–23)
CO2: 21 mmol/L — ABNORMAL LOW (ref 22–32)
Calcium: 8.3 mg/dL — ABNORMAL LOW (ref 8.9–10.3)
Chloride: 98 mmol/L (ref 98–111)
Creatinine, Ser: 6.11 mg/dL — ABNORMAL HIGH (ref 0.61–1.24)
GFR calc Af Amer: 9 mL/min — ABNORMAL LOW (ref >60)
GFR calc non Af Amer: 8 mL/min — ABNORMAL LOW (ref >60)
Glucose, Bld: 139 mg/dL — ABNORMAL HIGH (ref 70–99)
Phosphorus: 2.9 mg/dL (ref 2.5–4.6)
Potassium: 4.7 mmol/L (ref 3.5–5.1)
Sodium: 133 mmol/L — ABNORMAL LOW (ref 135–145)

## 2018-06-18 LAB — BASIC METABOLIC PANEL
Anion gap: 17 — ABNORMAL HIGH (ref 5–15)
Anion gap: 15 (ref 5–15)
BUN: 61 mg/dL — ABNORMAL HIGH (ref 8–23)
BUN: 61 mg/dL — ABNORMAL HIGH (ref 8–23)
CO2: 21 mmol/L — ABNORMAL LOW (ref 22–32)
CO2: 22 mmol/L (ref 22–32)
Calcium: 8.6 mg/dL — ABNORMAL LOW (ref 8.9–10.3)
Calcium: 8.7 mg/dL — ABNORMAL LOW (ref 8.9–10.3)
Chloride: 95 mmol/L — ABNORMAL LOW (ref 98–111)
Chloride: 98 mmol/L (ref 98–111)
Creatinine, Ser: 7.68 mg/dL — ABNORMAL HIGH (ref 0.61–1.24)
Creatinine, Ser: 7.72 mg/dL — ABNORMAL HIGH (ref 0.61–1.24)
GFR calc Af Amer: 7 mL/min — ABNORMAL LOW (ref >60)
GFR calc Af Amer: 7 mL/min — ABNORMAL LOW (ref >60)
GFR calc non Af Amer: 6 mL/min — ABNORMAL LOW (ref >60)
GFR calc non Af Amer: 6 mL/min — ABNORMAL LOW (ref >60)
Glucose, Bld: 235 mg/dL — ABNORMAL HIGH (ref 70–99)
Glucose, Bld: 120 mg/dL — ABNORMAL HIGH (ref 70–99)
Potassium: 5.3 mmol/L — ABNORMAL HIGH (ref 3.5–5.1)
Potassium: 4.8 mmol/L (ref 3.5–5.1)
Sodium: 133 mmol/L — ABNORMAL LOW (ref 135–145)
Sodium: 135 mmol/L (ref 135–145)

## 2018-06-18 LAB — POCT I-STAT 4, (NA,K, GLUC, HGB,HCT)
Glucose, Bld: 278 mg/dL — ABNORMAL HIGH (ref 70–99)
Glucose, Bld: 249 mg/dL — ABNORMAL HIGH (ref 70–99)
HCT: 34 % — ABNORMAL LOW (ref 39.0–52.0)
HCT: 32 % — ABNORMAL LOW (ref 39.0–52.0)
Hemoglobin: 11.6 g/dL — ABNORMAL LOW (ref 13.0–17.0)
Hemoglobin: 10.9 g/dL — ABNORMAL LOW (ref 13.0–17.0)
Potassium: 5 mmol/L (ref 3.5–5.1)
Potassium: 4.8 mmol/L (ref 3.5–5.1)
Sodium: 133 mmol/L — ABNORMAL LOW (ref 135–145)
Sodium: 135 mmol/L (ref 135–145)

## 2018-06-18 LAB — POCT I-STAT, CHEM 8
BUN: 48 mg/dL — ABNORMAL HIGH (ref 8–23)
Calcium, Ion: 1.03 mmol/L — ABNORMAL LOW (ref 1.15–1.40)
Chloride: 99 mmol/L (ref 98–111)
Creatinine, Ser: 7.2 mg/dL — ABNORMAL HIGH (ref 0.61–1.24)
Glucose, Bld: 272 mg/dL — ABNORMAL HIGH (ref 70–99)
HCT: 33 % — ABNORMAL LOW (ref 39.0–52.0)
Hemoglobin: 11.2 g/dL — ABNORMAL LOW (ref 13.0–17.0)
Potassium: 5.4 mmol/L — ABNORMAL HIGH (ref 3.5–5.1)
Sodium: 135 mmol/L (ref 135–145)
TCO2: 21 mmol/L — ABNORMAL LOW (ref 22–32)

## 2018-06-18 LAB — PROTIME-INR
INR: 1 (ref 0.8–1.2)
INR: 1.2 (ref 0.8–1.2)
Prothrombin Time: 13.2 seconds (ref 11.4–15.2)
Prothrombin Time: 14.6 seconds (ref 11.4–15.2)

## 2018-06-18 LAB — ECHOCARDIOGRAM COMPLETE
Height: 65 in
Weight: 2409.19 oz

## 2018-06-18 LAB — MAGNESIUM: Magnesium: 3.2 mg/dL — ABNORMAL HIGH (ref 1.7–2.4)

## 2018-06-18 LAB — PHOSPHORUS: Phosphorus: 2.7 mg/dL (ref 2.5–4.6)

## 2018-06-18 LAB — APTT: aPTT: 33 seconds (ref 24–36)

## 2018-06-18 MED ORDER — INSULIN ASPART 100 UNIT/ML ~~LOC~~ SOLN
1.0000 [IU] | SUBCUTANEOUS | Status: DC
  Administered 2018-06-18 – 2018-06-19 (×4): 1 [IU] via SUBCUTANEOUS
  Administered 2018-06-20: 3 [IU] via SUBCUTANEOUS
  Administered 2018-06-20: 2 [IU] via SUBCUTANEOUS
  Administered 2018-06-20 – 2018-06-21 (×2): 1 [IU] via SUBCUTANEOUS
  Administered 2018-06-21 (×2): 2 [IU] via SUBCUTANEOUS
  Administered 2018-06-21 (×2): 1 [IU] via SUBCUTANEOUS
  Administered 2018-06-21 (×2): 2 [IU] via SUBCUTANEOUS
  Administered 2018-06-22: 3 [IU] via SUBCUTANEOUS
  Administered 2018-06-22: 2 [IU] via SUBCUTANEOUS

## 2018-06-18 MED ORDER — PRISMASOL BGK 4/2.5 32-4-2.5 MEQ/L IV SOLN
INTRAVENOUS | Status: DC
  Administered 2018-06-18 – 2018-06-19 (×5): via INTRAVENOUS_CENTRAL
  Filled 2018-06-18 (×14): qty 5000

## 2018-06-18 MED ORDER — SODIUM CHLORIDE 0.9 % IV SOLN
0.7500 ug/kg/min | INTRAVENOUS | Status: DC
  Administered 2018-06-18 – 2018-06-20 (×4): 0.75 ug/kg/min via INTRAVENOUS
  Filled 2018-06-18 (×5): qty 50

## 2018-06-18 MED ORDER — INSULIN DETEMIR 100 UNIT/ML ~~LOC~~ SOLN
5.0000 [IU] | Freq: Two times a day (BID) | SUBCUTANEOUS | Status: DC
  Administered 2018-06-18 – 2018-06-22 (×8): 5 [IU] via SUBCUTANEOUS
  Filled 2018-06-18 (×9): qty 0.05

## 2018-06-18 MED ORDER — HEPARIN SODIUM (PORCINE) 1000 UNIT/ML DIALYSIS
1000.0000 [IU] | INTRAMUSCULAR | Status: DC | PRN
  Administered 2018-06-18: 2400 [IU] via INTRAVENOUS_CENTRAL
  Filled 2018-06-18 (×2): qty 6
  Filled 2018-06-18: qty 3

## 2018-06-18 MED ORDER — CHLORHEXIDINE GLUCONATE CLOTH 2 % EX PADS
6.0000 | MEDICATED_PAD | Freq: Every day | CUTANEOUS | Status: DC
  Administered 2018-06-19 – 2018-06-23 (×4): 6 via TOPICAL

## 2018-06-18 MED ORDER — PRISMASOL BGK 0/2.5 32-2.5 MEQ/L IV SOLN
INTRAVENOUS | Status: DC
  Administered 2018-06-18 – 2018-06-21 (×5): via INTRAVENOUS_CENTRAL
  Filled 2018-06-18 (×9): qty 5000

## 2018-06-18 MED ORDER — CHLORHEXIDINE GLUCONATE CLOTH 2 % EX PADS: 6.0000 | MEDICATED_PAD | Freq: Every day | CUTANEOUS | Status: DC

## 2018-06-18 MED ORDER — METOPROLOL TARTRATE 5 MG/5ML IV SOLN: 5.0000 mg | Freq: Once | INTRAVENOUS | Status: DC

## 2018-06-18 MED ORDER — PRISMASOL BGK 0/2.5 32-2.5 MEQ/L IV SOLN: INTRAVENOUS | Status: DC

## 2018-06-18 MED ORDER — PERFLUTREN LIPID MICROSPHERE
1.0000 mL | INTRAVENOUS | Status: AC | PRN
  Administered 2018-06-18: 4 mL via INTRAVENOUS
  Filled 2018-06-18: qty 10

## 2018-06-18 MED ORDER — IOHEXOL 300 MG/ML  SOLN: 50.0000 mL | Freq: Once | INTRAMUSCULAR | Status: DC | PRN

## 2018-06-18 MED ORDER — SODIUM CHLORIDE 0.9% FLUSH: 10.0000 mL | Freq: Two times a day (BID) | INTRAVENOUS | Status: DC

## 2018-06-18 MED ORDER — PRISMASOL BGK 0/2.5 32-2.5 MEQ/L IV SOLN
INTRAVENOUS | Status: DC
  Administered 2018-06-18 – 2018-06-20 (×3): via INTRAVENOUS_CENTRAL
  Filled 2018-06-18 (×6): qty 5000

## 2018-06-18 MED ORDER — SODIUM CHLORIDE 0.9% FLUSH: 10.0000 mL | INTRAVENOUS | Status: DC | PRN

## 2018-06-18 MED ORDER — HEPARIN (PORCINE) 2000 UNITS/L FOR CRRT
INTRAVENOUS_CENTRAL | Status: DC | PRN
  Administered 2018-06-18: 12:00:00 via INTRAVENOUS_CENTRAL
  Filled 2018-06-18 (×2): qty 1000

## 2018-06-18 MED FILL — Medication: Qty: 1 | Status: AC

## 2018-06-18 NOTE — Progress Notes (Signed)
Initial Nutrition Assessment  DOCUMENTATION CODES:   Not applicable  INTERVENTION:   -Recommend addition of B complex with Vitamin C  Once access is obtained:  Vital 1.5 @ 20 ml/hr  Increase by 10 ml Q6 hours to goal rate of 45 ml/hr (1080 ml) 30 ml Prostat TID  At goal TF provides: 1920 kcals, 118 grams protein, 821 ml free water. Meets 100% of needs.  NUTRITION DIAGNOSIS:   Increased nutrient needs related to acute illness as evidenced by estimated needs.  GOAL:   Provide needs based on ASPEN/SCCM guidelines  MONITOR:   Diet advancement, TF tolerance, Skin, Vent status, Weight trends, Labs, I & O's  REASON FOR ASSESSMENT:   Ventilator    ASSESSMENT:   Patient with PMH significant for CAD s/p CABG with multiple PCI and PTCA interventions, ischemic cardiomyopathy, ESRD on HD, DM, HTN, HLD, PUD, CVA, and colon cancer s/p resection. Presents this admission VT cardiac arrest during iHD treatment.    Pt on TTM. Requiring low dose pressors. CRRTstarted today (too unstable for HD). OGT was unable to pass after multiple attempts by nursing, likely due to postpharyngeal swelling. Cortrak ordered but service is not offered until Friday. Discussed other options with nursing. RD to leave TF recommendations.   EDW documented as 63.5 kg. Current weight: 68.3 kg.   No edema documented in nursing assessment.   Patient is currently intubated on ventilator support MV: 7.9 L/min Temp (24hrs), Avg:92.9 F (33.8 C), Min:89.4 F (31.9 C), Max:98.3 F (36.8 C) BP: 155/48 MAP: 83 (A-line)  I/O: +1,416 ml since admit UOP: 100 ml x 24 hrs   Drips: nimbex, insulin, versed, levophed Medications: sensipar, hectorol Labs: Cr 7.72 (H) CBG 97-176  Diet Order:   Diet Order            Diet NPO time specified  Diet effective now              EDUCATION NEEDS:   Not appropriate for education at this time  Skin:  Skin Assessment: Reviewed RN Assessment  Last BM:  PTA  Height:    Ht Readings from Last 1 Encounters:  06/13/2018 5\' 5"  (1.651 m)    Weight:   Wt Readings from Last 1 Encounters:  06/16/2018 68.3 kg    Ideal Body Weight:  61.8 kg  BMI:  Body mass index is 25.06 kg/m.  Estimated Nutritional Needs:   Kcal:  1905 kcal  Protein:  110-130 grams  Fluid:  >/= 2 L/day    Mariana Single RD, LDN Clinical Nutrition Pager # - [phone]

## 2018-06-18 NOTE — Progress Notes (Addendum)
Progress Note  Patient Name: [name] [name] Date of Encounter: 06/18/2018  Primary Cardiologist: Dr. Ellyn Hack  Subjective   Intubated and sedated on hypothermia protocol now at 33 C  Inpatient Medications    Scheduled Meds:  artificial tears  1 application Both Eyes P3A   aspirin  81 mg Oral Once   atorvastatin  40 mg Oral QPM   chlorhexidine gluconate (MEDLINE KIT)  15 mL Mouth Rinse BID   Chlorhexidine Gluconate Cloth  6 each Topical Q0600   Chlorhexidine Gluconate Cloth  6 each Topical Daily   Chlorhexidine Gluconate Cloth  6 each Topical Daily   cinacalcet  30 mg Oral Q supper   clopidogrel  75 mg Oral Daily   doxercalciferol  1 mcg Intravenous Q T,Th,Sa-HD   fentaNYL (SUBLIMAZE) injection  50 mcg Intravenous Once   ferric citrate  420 mg Oral TID WC   heparin  5,000 Units Subcutaneous Q8H   levothyroxine  62.5 mcg Intravenous Daily   mouth rinse  15 mL Mouth Rinse 10 times per day   midazolam  1 mg Intravenous Once   nystatin  5 mL Oral QID   sodium chloride flush  10-40 mL Intracatheter Q12H   sodium chloride flush  3 mL Intravenous Q12H   Continuous Infusions:  sodium chloride     sodium chloride 10 mL/hr at 06/18/18 0800   amiodarone 30 mg/hr (06/18/18 0800)   cisatracurium (NIMBEX) infusion 1 mcg/kg/min (06/18/18 0800)   famotidine (PEPCID) IV Stopped (06/14/2018 2355)   fentaNYL infusion INTRAVENOUS 100 mcg/hr (06/18/18 0800)   insulin 9.3 mL/hr at 06/18/18 0800   lidocaine 0.5 mg/min (06/18/18 0800)   midazolam 2 mg/hr (06/18/18 0800)   norepinephrine (LEVOPHED) Adult infusion 4 mcg/min (06/18/18 0800)   PRN Meds: sodium chloride, [COMPLETED] cisatracurium **AND** cisatracurium (NIMBEX) infusion **AND** cisatracurium, fentaNYL, fentaNYL (SUBLIMAZE) injection, fentaNYL (SUBLIMAZE) injection, ferric citrate **AND** ferric citrate, heparin, lidocaine-prilocaine, Melatonin, midazolam, perflutren lipid microspheres (DEFINITY) IV  suspension, sodium chloride flush, sodium chloride flush   Vital Signs    Vitals:   06/18/18 0600 06/18/18 0700 06/18/18 0725 06/18/18 0800  BP:   (!) 140/41   Pulse: (!) 41 (!) 39 (!) 38 (!) 40  Resp:   16   Temp: (!) 92.3 F (33.5 C) (!) 90.1 F (32.3 C)  (!) 90 F (32.2 C)  TempSrc: Core (Comment) Core  Core  SpO2: 100% 100% 100% 100%  Weight:      Height:        Intake/Output Summary (Last 24 hours) at 06/18/2018 0907 Last data filed at 06/18/2018 0800 Gross per 24 hour  Intake 998.22 ml  Output -168 ml  Net 1166.22 ml    I/O since admission: +1166  Filed Weights   06/10/2018 0714 06/21/2018 1900  Weight: 66.7 kg 68.3 kg    Telemetry    SB at 44- Personally Reviewed  ECG    ECG (independently read by me): Marked sinus bradycardia at 39; LBBB  Physical Exam    BP (!) 140/41    Pulse (!) 40    Temp (!) 90 F (32.2 C) (Core)    Resp 16    Ht '5\' 5"'$  (1.651 m)    Wt 68.3 kg    SpO2 100%    BMI 25.06 kg/m  General: Intubated and paralyzed Skin: normal turgor, no rashes, warm and dry HEENT: Normocephalic, atraumatic. Pupils equal round and reactive to light; sclera anicteric; extraocular muscles intact;  Nose without nasal septal hypertrophy  Mouth/Parynx orally intubated Neck: No JVD, no carotid bruits; normal carotid upstroke Lungs: clear to ausculatation and percussion; no wheezing or rales Chest wall: without tenderness to palpitation Heart: PMI not displaced, bradycardic, s1 s2 normal, 1/6 systolic murmur, no diastolic murmur, no rubs, gallops, thrills, or heaves Abdomen: soft, nontender; no hepatosplenomehaly, BS+; abdominal aorta nontender and not dilated by palpation. Back: no CVA tenderness Pulses 2+ L arm fistula Musculoskeletal:  Extremities: no clubbing cyanosis or edema, Homan's sign negative  Neurologic: grossly nonfocal; Cranial nerves grossly wnl Psychologic: Normal mood and affect    Labs    Chemistry Recent Labs  Lab 06/11/2018 1811   06/11/2018 2014 06/19/2018 2324 06/18/18 0112 06/18/18 0312 06/18/18 0450  NA 132*   < > 134* 135 133* 135 133*  K 7.4*   < > 4.8 5.4* 5.0 4.8 5.3*  CL 93*   < > 95* 99  --   --  95*  CO2 20*  --  18*  --   --   --  21*  GLUCOSE 211*   < > 526* 272* 278* 249* 235*  BUN 62*   < > 55* 48*  --   --  61*  CREATININE 7.93*   < > 6.81* 7.20*  --   --  7.68*  CALCIUM 7.8*  --  9.0  --   --   --  8.6*  GFRNONAA 6*  --  7*  --   --   --  6*  GFRAA 7*  --  8*  --   --   --  7*  ANIONGAP 19*  --  21*  --   --   --  17*   < > = values in this interval not displayed.     Hematology Recent Labs  Lab 06/13/2018 0733  06/18/18 0112 06/18/18 0312 06/18/18 0450  WBC 9.7  --   --   --  20.0*  RBC 3.46*  --   --   --  3.40*  HGB 11.4*   < > 11.6* 10.9* 11.3*  HCT 35.9*   < > 34.0* 32.0* 34.1*  MCV 103.8*  --   --   --  100.3*  MCH 32.9  --   --   --  33.2  MCHC 31.8  --   --   --  33.1  RDW 13.9  --   --   --  13.8  PLT 253  --   --   --  260   < > = values in this interval not displayed.    Cardiac Enzymes Recent Labs  Lab 06/29/2018 2014  TROPONINI 0.52*   No results for input(s): TROPIPOC in the last 168 hours.   BNPNo results for input(s): BNP, PROBNP in the last 168 hours.   DDimer No results for input(s): DDIMER in the last 168 hours.   Lipid Panel     Component Value Date/Time   CHOL 145 04/11/2018 0930   CHOL 162 06/28/2016 1427   TRIG 184.0 (H) 04/11/2018 0930   HDL 24.10 (L) 04/11/2018 0930   HDL 36 (L) 06/28/2016 1427   CHOLHDL 6 04/11/2018 0930   VLDL 36.8 04/11/2018 0930   LDLCALC 84 04/11/2018 0930   LDLCALC 97 06/28/2016 1427   LDLDIRECT 154.0 04/22/2017 0954     Radiology    Dg Chest Port 1 View  Result Date: 06/18/2018 CLINICAL DATA:  Central line placement EXAM: PORTABLE CHEST 1 VIEW COMPARISON:  07/04/2018. FINDINGS: The  right-sided central venous catheter tip projects over the expected region of the proximal SVC. There is a new left subclavian central  venous catheter with tip projecting over the distal SVC. The endotracheal tube terminates above the carina. The patient is status post prior median sternotomy. The heart size is somewhat enlarged. There is volume overload. Multiple vascular stents are noted in the left axilla. There is no evidence of a pneumo thorax. The right costophrenic angle is not well visualized. The retrocardiac region is poorly evaluated secondary to an overlying defibrillator pad. IMPRESSION: 1. Newly placed left subclavian central venous catheter with tip projecting over the distal SVC. Stable appearance of the remaining lines and tubes. 2. No evidence of a pneumothorax, however the right costophrenic angle is not fully visualized on this exam. 3. Otherwise, stable appearance of the chest when compared to recent prior study. Electronically Signed   By: Constance Holster M.D.   On: 06/18/2018 02:01   Dg Chest Port 1 View  Result Date: 06/27/2018 CLINICAL DATA:  78 year old male central line placement. EXAM: PORTABLE CHEST 1 VIEW COMPARISON:  Portable chest earlier today. FINDINGS: Portable AP semi upright view at 2138 hours. Right IJ approach dual lumen appearing catheter with tip at the superior mediastinum, innominate vein confluence level. No pneumothorax. Stable cardiac size and mediastinal contours. Stable ET tube. Prior CABG. Improved lung volumes and bilateral perihilar ventilation with residual interstitial opacity greater on the left. No pleural effusion. Negative visible bowel gas pattern. Left axillary/subclavian vascular stents. IMPRESSION: 1. Right IJ approach dual lumen catheter placed with tip at innominate vein confluence level. 2. Stable ET tube. 3. No pneumothorax. Improved lung volumes and bilateral perihilar ventilation since 2000 hours. Electronically Signed   By: Genevie Ann M.D.   On: 06/09/2018 22:00   Dg Chest Port 1 View  Result Date: 06/23/2018 CLINICAL DATA:  Cardiopulmonary arrest.  Intubated. EXAM: PORTABLE  CHEST 1 VIEW COMPARISON:  05/18/2016 FINDINGS: Previous median sternotomy and CABG. Endotracheal tube tip is 3 cm above the carina. There is an interstitial pulmonary edema pattern. No dense consolidation, collapse or effusion. Left subclavian and axillary vascular stents. IMPRESSION: Endotracheal tube well positioned.  Interstitial pulmonary edema. Electronically Signed   By: Nelson Chimes M.D.   On: 07/04/2018 20:20    Cardiac Studies    CULPRIT LESION: Ost RCA to Prox RCA lesion is 90% stenosed. Prox RCA lesion is 45% stenosed. -Moderate-severely calcified  A drug-eluting stent was successfully placed using a STENT RESOLUTE [id number]. Postdilated in tapered fashion from 3.7 down to 3.2 mm  Post intervention, there is a 0% residual stenosis.  ------------------------------------------------------------  Mid RCA-2 overlapping stent segment is ~ 65% stenosed.  Scoring balloon angioplasty was performed using a BALLOON WOLVERINE 3.00X6.  Post intervention, there is a 15% residual stenosis.  ---------------------------------------------------------------------  Dist RCA lesion is 45% stenosed. Post Atrio lesion is 100% stenosed.   SUMMARY  Successful orbital atherectomy based DES PCI of ostial-proximal RCA (Resolute Onyx DES 3.0 mm x 38 mm --> 3.7-3.2 mm taper post dilation)  Successful Scoring Balloon angioplasty and post dilation (with 3.5 mm [place] balloon) of mid RCA ISR  Intermittent pacing with Temporary Pacemaker-removed post PCI  Successful defibrillation of Ventricular Tachycardia-1 shock 200 J   RECOMMENDATIONS  Overnight observation post PCI with sheath removal in PACU holding area  Continue home medications as prescribed  He will need dialysis today and anticipate discharge tomorrow  He already has scheduled follow-up    Intervention  Patient Profile     78 y.o. male with ESRD on dialysis, MVCAD, s/p CABG, who presented to hospital on to undergo  atherectomy of severely calcifed RCA.  Assessment & Plan    1. CAD: severe MVCAD as noted above ; s/p elective CSI atherectomy to RCA yesterday. Pt had been on chronic plavix PTA.   2. Cardiac Arrest: Developed recurrent episodes of NSVT and required defibrillation in cath lab for VT and last evening developed VT cardiac arrest in dialysis lab in setting of K 7.7 and hypoMg.  Sucessufully resuscitated; now in Unit on amiodarone, lidocaine, levophed, hypothermia, intubated, sedated and paralyzed on versed, fentanyl and nimbez.  3. LBBB;  ECG earlier today with marked SB at 39; HR now 44. Will dc lidocaine, decrease amiodarone from 30 mg/h to 15 mg/h. On hypothermic protocol with T 33C also contributing to bradycardia.   4. Hypotension: currently BOP stable on levophed  With BP 108/40  5. Antiplatelet RX; Pt was on plavix pre-procedure. Now intubated and unable to pass NG or feeding tube. CorTrak team unavaiable until Friday to attempt placement, will therefore will start cangrelor infusion until oral meds can be taken.  6. Mechanical ventilation: followed by Dr. Valeta Harms.  7 ESRD: did not get adequate dialysis yesterday; seen by Dr. Azzie Roup;  Consider CVVHD per nephrology.  I called wife at home and provided her an update.  Time spent: 45 minutes  Signed, Troy Sine, MD, [facility] 06/18/2018, 9:07 AM

## 2018-06-18 NOTE — Progress Notes (Signed)
  Echocardiogram 2D Echocardiogram with definity has been performed.  Darlina Sicilian M 06/18/2018, 8:53 AM

## 2018-06-18 NOTE — Progress Notes (Signed)
eLink Physician-Brief Progress Note Patient Name: [name] [name] DOB: 06-26-1940 MRN: [id number]   Date of Service  06/18/2018  HPI/Events of Note  Request to review CXR for L subclavian CVL placement. L subclavian CVL tip in SVC. No pneumothorax appreciated.   eICU Interventions  OK to use L subclavian CVL.     Intervention Category Intermediate Interventions: Diagnostic test evaluation  [name] 06/18/2018, 1:52 AM

## 2018-06-18 NOTE — Progress Notes (Addendum)
ANTICOAGULATION CONSULT NOTE - Initial Consult  Pharmacy Consult for Cangrelor  Indication: DES s/p PCI   Allergies  Allergen Reactions  . Brilinta [Ticagrelor] Shortness Of Breath  . Shellfish Allergy Other (See Comments)    Causes gout flare-ups    Patient Measurements: Height: '5\' 5"'$  (165.1 cm) Weight: 150 lb 9.2 oz (68.3 kg) IBW/kg (Calculated) : 61.5  Vital Signs: Temp: 90 F (32.2 C) (06/10 0800) Temp Source: Core (06/10 0800) BP: 140/41 (06/10 0725) Pulse Rate: 40 (06/10 0800)  Labs: Recent Labs    06/23/2018 0733  06/20/2018 2014 06/22/2018 2324 06/18/18 0112 06/18/18 0206 06/18/18 0312 06/18/18 0450  HGB 11.4*   < >  --  11.2* 11.6*  --  10.9* 11.3*  HCT 35.9*   < >  --  33.0* 34.0*  --  32.0* 34.1*  PLT 253  --   --   --   --   --   --  260  LABPROT  --   --   --   --   --  13.2  --   --   INR  --   --   --   --   --  1.0  --   --   CREATININE 7.76*   < > 6.81* 7.20*  --   --   --  7.68*  TROPONINI  --   --  0.52*  --   --   --   --   --    < > = values in this interval not displayed.    Estimated Creatinine Clearance: 7 mL/min (A) (by C-G formula based on SCr of 7.68 mg/dL (H)).   Medical History: Past Medical History:  Diagnosis Date  . Anemia    Likely secondary to history of GI bleed  . Anxiety   . Arthritis   . BPH (benign prostatic hyperplasia)   . CAD (coronary artery disease) of bypass graft 2006, 2012   In 2006: Occluded SVG-OM noted (SVG-diagonal was previously occluded); 2012: Severe lesion in redo SVG-OM1 --> BMS PCI   . CAD in native artery; and the grafts 1992, 1997, 2002, 2006, 2012   CABG 1992 and redo CABG 2006  . CAD S/P percutaneous coronary angioplasty October 2012; Feb, Apr & Oct 2015; Feb 2016   a) s/p CABG 1992 - redo 2006. b) 10/12: NSTEMI: PCI-SVG-OM1 Integrity BMS 3.5 x 15 (3.85 mm) c) 2/15: UA - PCI mRCA Xience Ap DES 2.75 x 15 (3.1 mm). d) 4/15 : UA - focal dRCA Resolute DES 2.25 x 8 (2.5 mm). e) 10/15: PCI- mRCA ISR w/ post  stent - Promus P 2.75 x 16 (3.25 mm). f) 2/16: NSTEMI: CBA/PTCA mRCA ISR (3.5 mm); g) 7/16 ISR RCA->CBA/PTCA, -- Most recent event: 05/11/2: CBA of ISR.  Marland Kitchen Carotid arterial disease ([place])    a) Duplex 05/2012: R BULB/PROX ICA: 50-69%, L BULB/PROX ICA: 0-49%. ;; 04/29/2014: 22-02% RICA, 54-27% LICA. Patetn vertebrals,  . Chronic low back pain   . Chronic stable angina (HCC)    Chronic stable  . Colon cancer [facility]) 2003   colectomy for CA. no recurrence . never required  radiation or chem  . Diabetes mellitus with chronic kidney disease on chronic dialysis, without long-term current use of insulin ([place])   . Dyspnea    "when I get too much Fluid"  . End stage renal disease on dialysis [facility])    Dialyzes at [facility]: Tues/Th/Sat - left upper cavity AV fistula brachiocephalic  .  Gout   . History of hiatal hernia   . History of Non-ST elevated myocardial infarction (non-STEMI) 1992, 2006, 2012; 02/2013  . Hyperlipidemia   . Hypertension   . Hypothyroidism    On supplementation  . Ischemic cardiomyopathy    a) EF 45-50% by echo 2015. b) EF 50% by cath 04/2014.  Marland Kitchen LBBB (left bundle branch block)    Chronic  . Peptic ulcer disease  2004  . Plavix resistance    a) P2Y12 264 in 02/2014 - put on Brilinta but did not tolerate due to SOB. b) cannot be on Effient due to history of stroke. c) Plavix increased to '75mg'$  BID and Pletal added 04/2014 due to ISR.  Marland Kitchen Pneumonia   . Skin cancer 02/1999   nose  . Stroke [facility]) 1998    Medications:  Scheduled:  . artificial tears  1 application Both Eyes F2X  . aspirin  81 mg Oral Once  . atorvastatin  40 mg Oral QPM  . chlorhexidine gluconate (MEDLINE KIT)  15 mL Mouth Rinse BID  . Chlorhexidine Gluconate Cloth  6 each Topical Q0600  . Chlorhexidine Gluconate Cloth  6 each Topical Daily  . Chlorhexidine Gluconate Cloth  6 each Topical Daily  . cinacalcet  30 mg Oral Q supper  . clopidogrel  75 mg Oral Daily  . doxercalciferol  1 mcg Intravenous Q  T,Th,Sa-HD  . fentaNYL (SUBLIMAZE) injection  50 mcg Intravenous Once  . ferric citrate  420 mg Oral TID WC  . heparin  5,000 Units Subcutaneous Q8H  . levothyroxine  62.5 mcg Intravenous Daily  . mouth rinse  15 mL Mouth Rinse 10 times per day  . midazolam  1 mg Intravenous Once  . nystatin  5 mL Oral QID  . sodium chloride flush  10-40 mL Intracatheter Q12H  . sodium chloride flush  3 mL Intravenous Q12H    Assessment: [name] [name] is a 78 year old male s/p antherectomy based DES PCI of ostial-proximal RCA on 6/9. PMH significant for extensive CAD, s/p CABG with multiple PCI and PTCA interventions on RCA for in-stent resetnosis, ischemic cardimyoptahy, ESRD on HD TTS. Pharmacy consulted for cangrelor infusion while patient is NPO and awaiting cortak placement trial for this Friday 6/12.  Patient did not receive any plavix loading during PCI on 6/9 per Dr.Kelly's conversation with Dr.Harding due to the patient being on plavix PTA daily, last dose was at home on 06/16/08   Patient is sedated and intubated in bed this morning, no signs or symptoms of bleeding note and on examination by RN while rounding. CBC stable with hgb at 11.3, hct 34.1, platelets 260.  Goal of Therapy:  Anti-platelet coverage for secondary prevention Monitor platelets by anticoagulation protocol: Yes   Plan:  Start cangrelor IV infusion at 0.35mg/kg/min; no bolus. Monitor signs/symptoms of bleeding Follow-up cortrak placement on Friday to transition to back to plavix. Daily CBC  Thank you for the interesting consult and for involving pharmacy in this patient's care.  JTamela Gammon PharmD 06/18/2018 9:44 AM PGY-1 Pharmacy Resident Direct Phone: [phone] check [email or web address] for unit-specific pharmacist phone numbers

## 2018-06-18 NOTE — Progress Notes (Signed)
[facility] Progress Note  Subjective: no changes overnight, on low dose levo gtt this am, IV lido gtt dc'd this am  Vitals:   06/18/18 0600 06/18/18 0700 06/18/18 0725 06/18/18 0800  BP:   (!) 140/41   Pulse: (!) 41 (!) 39 (!) 38 (!) 40  Resp:   16   Temp: (!) 92.3 F (33.5 C) (!) 90.1 F (32.3 C)  (!) 90 F (32.2 C)  TempSrc: Core (Comment) Core  Core  SpO2: 100% 100% 100% 100%  Weight:      Height:        Inpatient medications: . artificial tears  1 application Both Eyes R4Y  . aspirin  81 mg Oral Once  . atorvastatin  40 mg Oral QPM  . chlorhexidine gluconate (MEDLINE KIT)  15 mL Mouth Rinse BID  . Chlorhexidine Gluconate Cloth  6 each Topical Daily  . Chlorhexidine Gluconate Cloth  6 each Topical Daily  . cinacalcet  30 mg Oral Q supper  . doxercalciferol  1 mcg Intravenous Q T,Th,Sa-HD  . fentaNYL (SUBLIMAZE) injection  50 mcg Intravenous Once  . ferric citrate  420 mg Oral TID WC  . heparin  5,000 Units Subcutaneous Q8H  . levothyroxine  62.5 mcg Intravenous Daily  . mouth rinse  15 mL Mouth Rinse 10 times per day  . midazolam  1 mg Intravenous Once  . nystatin  5 mL Oral QID  . sodium chloride flush  10-40 mL Intracatheter Q12H  . sodium chloride flush  3 mL Intravenous Q12H   . sodium chloride    . sodium chloride 10 mL/hr at 06/18/18 0800  . amiodarone 15 mg/hr (06/18/18 0922)  . cangrelor 50 mg in NS 250 mL    . cisatracurium (NIMBEX) infusion 1 mcg/kg/min (06/18/18 0800)  . famotidine (PEPCID) IV Stopped (06/16/2018 2355)  . fentaNYL infusion INTRAVENOUS 100 mcg/hr (06/18/18 0800)  . insulin 3.5 Units/hr (06/18/18 0916)  . midazolam 2 mg/hr (06/18/18 1007)  . norepinephrine (LEVOPHED) Adult infusion 4 mcg/min (06/18/18 0800)  . prismasol BGK 2/2.5 replacement solution    . prismasol BGK 2/2.5 replacement solution    . prismasol BGK 4/2.5     sodium chloride, [COMPLETED] cisatracurium **AND** cisatracurium (NIMBEX) infusion **AND**  cisatracurium, fentaNYL, fentaNYL (SUBLIMAZE) injection, fentaNYL (SUBLIMAZE) injection, ferric citrate **AND** ferric citrate, heparin, heparin, lidocaine-prilocaine, Melatonin, midazolam, perflutren lipid microspheres (DEFINITY) IV suspension, sodium chloride flush, sodium chloride flush    Exam: General appearance: Intubated and unresponsive Head: Normocephalic, without obvious abnormality, atraumatic Resp: rhonchi bilaterally Cardio: tachy no rub GI: soft, non-tender; bowel sounds normal; no masses,  no organomegaly Extremities: cool to touch, no edema, LUE AVF +T/B Dialysis Access: LUE AVF +T/B  Dialysis: [place]  TTS  4h  2/2.25 bath  63.5kg  Hep 3000  LUE AVF  350/800  P4 Hectoral 1 mcg IV/HD Micera 30 mcg IV every 6 weeks (last given 05/13/18)  Assessment/Plan: 1.  VT arrest- per cards 2. Multivessel CAD- s/p intervention of 2 lesions in RCA and DES placed in prox RCA.  Cardiology following.   3. ESRD - too unstable for HD. CRRT to start today 4.  Hypertension/volume  - hypotensive on low dose pressors 5.  Volume: up several kg, no edema on exam, CXR ok 6.  Anemia  -  Follow H/H 7.  Metabolic bone disease -  stable 8.  Nutrition - npo for now.    [facility] 06/18/2018, 10:20 AM  Iron/TIBC/Ferritin/ %Sat  Component Value Date/Time   IRON 15 (L) 10/13/2010 1500   TIBC 213 (L) 10/13/2010 1500   FERRITIN 116 10/13/2010 1500   IRONPCTSAT 7 (L) 10/13/2010 1500   Recent Labs  Lab 06/18/18 0206  06/18/18 0450  NA  --    < > 133*  K  --    < > 5.3*  CL  --   --  95*  CO2  --   --  21*  GLUCOSE  --    < > 235*  BUN  --   --  61*  CREATININE  --   --  7.68*  CALCIUM  --   --  8.6*  PHOS  --   --  2.7  INR 1.0  --   --    < > = values in this interval not displayed.   No results for input(s): AST, ALT, ALKPHOS, BILITOT, PROT in the last 168 hours. Recent Labs  Lab 06/18/18 0450  WBC 20.0*  HGB 11.3*  HCT 34.1*  PLT 260

## 2018-06-18 NOTE — Procedures (Signed)
Central Venous Catheter Insertion Procedure Note [name] [name] [id number] Feb 02, 1940  Procedure: Insertion of Central Venous Catheter Indications: Drug and/or fluid administration  Procedure Details Consent: Risks of procedure as well as the alternatives and risks of each were explained to the (patient/caregiver).  Consent for procedure obtained.Consent given by wife by phone confirmed by phone by nurse [name] [name] Time Out: Verified patient identification, verified procedure, site/side was marked, verified correct patient position, special equipment/implants available, medications/allergies/relevent history reviewed, required imaging and test results available.  Performed  Maximum sterile technique was used including antiseptics, cap, gloves, gown, hand hygiene, mask and sheet. Skin prep: Chlorhexidine; local anesthetic administered A antimicrobial bonded/coated triple lumen catheter was placed in the left subclavian vein using the Seldinger technique.  Evaluation Blood flow good Complications: No apparent complications Patient did tolerate procedure well. Chest X-ray ordered to verify placement.  CXR: pending.  [name] [name] 06/18/2018, 1:04 AM

## 2018-06-18 NOTE — Progress Notes (Signed)
NAME:  [name] [name], MRN:  [id number], DOB:  Oct 21, 1940, LOS: 0 ADMISSION DATE:  06/12/2018, CONSULTATION DATE:  07/02/2018 REFERRING MD:  Code team, CHIEF COMPLAINT:  Cardiac arrest  Brief History   78 year old male admitted for progressive angina s/p successful balloon angioplasty of the mid RCA and atherectomy based DES PCI of the ostial-proximal RCA 6/9 am.  Had ongoing VT throughout the day attributed to hypokalemia and hypomagnesemia.  Prior to [facility], he was noted to have K 7.4.  Had VT cardiac arrest shortly after starting iHD with ROSC after 20 mins.  Intubated and transferred to ICU, remains unresponsive.    History of present illness   HPI obtained from medical chart review as patient is intubated and sedated on mechanical ventilation.    78 year old male with complex history of extensive CAD s/p CABG with multiple PCI and PTCA interventions on the RCA for in-stent restenosis, Ischemic cardiomyopathy, ERSD on HD, DMT2, HTN, HLD, PUD, anemia, hypothyroidism, stroke, colon cancer s/p resection admitted 6/9 with progressive angina.   He had reported he was taking up to 2 bottles of nitroglycerin a month with angina at rest and waking him up at night with some ongoing hypotension on his delayed follow-up cardiology appointment on 5/28.  He underwent cardiac catheterization on 6/5 which showed modest progression of his mid RCA in-stent restenosis but new de novo lesion in the proximal RCA that would require staged atherectomy based PCI.  Therefore he was admitted 6/9 and underwent successful balloon angioplasty of the mid RCA and atherectomy based DES PCI of the ostial-proximal RCA; he required defibrillation x 1 during case for ventricular tachycardia.  Throughout the day he had intermittent episode of asymptomatic VT and noted to have hypokalemia and hypomagnesemia which were replaced and he was started on amiodarone drip.  He had also complained of ongoing chest pain without relief after sublingual  nitro. He was noted to have K 7.2 prior to dialysis.  He was started on iHD and after 7 minutes decompensated to VT cardiac arrest with ROSC obtained after 20 mins with ACLS measures including temporizing measures for hyperkalemia, lidocaine, amiodarone, and defibrillation x 2.  He was intubated by RT.  Post arrest, transferred to ICU.  He remained unresponsive and normotensive in SR.  PCCM consulted for further medical and ventilator management.  Nephrology has asked for hemodialysis catheter to initiate CRRT.   Past Medical History  Extensive CAD s/p CABG with multiple PCI and PTCA interventions on the RCA for in-stent restenosis, Ischemic cardiomyopathy, ERSD on HD, DMT2, HTN, HLD, PUD, anemia, hypothyroidism, stroke, colon cancer s/p resection  Significant Hospital Events   6/9 Admit/ cardiac arrest  Consults:  Cardiology Nephrology  Procedures:  6/9 ETT >> 6/9 R IJ HD cath >>  Significant Diagnostic Tests:  Cardiac Cath June 13, 2018:  Severe multivessel native CAD with CTO of ostial LCx that has 3 major OM branches all occluded, LAD after diffusely diseased D1, and new ostial RCA disease with moderate in-stent restenosis of the mid RCA.  Patent LIMA-LAD and SVG-OM 2 with occluded side branch of OM 2 and essentially subtotal occlusion of OM 2 that is not PCI amenable   Despite significant CAD, relatively preserved LVEF and EDP  6/9 LHC  >>  CULPRIT LESION: Ost RCA to Prox RCA lesion is 90% stenosed. Prox RCA lesion is 45% stenosed. -Moderate-severely calcified  A drug-eluting stent was successfully placed using a STENT RESOLUTE NWGN5.6O13. Postdilated in tapered fashion  from 3.7 down to 3.2 mm  Post intervention, there is a 0% residual stenosis.  ------------------------------------------------------------  Mid RCA-2 overlapping stent segment is ~ 65% stenosed.  Scoring balloon angioplasty was performed using a BALLOON WOLVERINE 3.00X6.  Post intervention, there is a 15%  residual stenosis.  ---------------------------------------------------------------------  Dist RCA lesion is 45% stenosed. Post Atrio lesion is 100% stenosed.  Micro Data:   Antimicrobials:   Interim history/subjective:   Multiple procedures overnight. Patient intubated on mechanical support. Code cool.   Objective   Blood pressure (!) 140/41, pulse (!) 40, temperature (!) 90 F (32.2 C), temperature source Core, resp. rate 16, height 5\' 5"  (1.651 m), weight 68.3 kg, SpO2 100 %. CVP:  [7 mmHg-12 mmHg] 12 mmHg  Vent Mode: PRVC FiO2 (%):  [60 %-100 %] 60 % Set Rate:  [16 bmp] 16 bmp Vt Set:  [500 mL] 500 mL PEEP:  [5 cmH20] 5 cmH20 Plateau Pressure:  [15 cmH20-18 cmH20] 17 cmH20   Intake/Output Summary (Last 24 hours) at 06/18/2018 3235 Last data filed at 06/18/2018 0800 Gross per 24 hour  Intake 998.22 ml  Output -168 ml  Net 1166.22 ml   Filed Weights   07/05/2018 0714 06/19/2018 1900  Weight: 66.7 kg 68.3 kg   Examination: General:  Intubated, sedated, cooled, critically ill  HEENT: MMM, trachea midline Neuro:  Pupils pinpoint, reactive, sedate CV: RRR, no murmur PULM: BL vented breaths  GI: soft, nt nd  Extremities: no edema, pads in place  Skin: no obvious rash   Resolved Hospital Problem list    Assessment & Plan:   VT cardiac arrest s/p recurrent VT w/ electrolyte imbalances Progressive Angina w/ Extensive CAD s/p prior CABG with multiple PCI and PTCA interventions on the RCA for in-stent restenosis s/p 6/9 Balloon angioplasty and atherectomy based DES PCI of the ostial-proximal RCA Ischemic cardiomyopathy  P:  Continue tele monitoring  Maintain MAP goal 65 with pressors if needed amio and lidocaine ggt  Follow electrolytes and replete K goal 4, mag goal 2  TTE pending  Attempt cortrack placement  Unable to get OGT in overnight  May need to consider kangralor  ?rectal asa until enteral access obtained   Acute encephalopathy post cardiac arrest w/ ROSC  after 20 mins P:  TTM protocol in place, 33 deg Sedation in place   Acute respiratory insufficieny in the setting of cardiac arrest  P:  Full vent support Wean as tolerated  Maintain sats >90%  Wean fio2 and peep   ESRD on iHD P:  Catheter in place  Dialysis need per nephrology   DM w/hyperglycemia P:  Insulin ggt Can make plans to transition to subq when possible   Hx HTN P:  Holding imdur and metoprolol   HLD P:  Continue statin   Reported dysphagia by family P:  Unable to place OGT by MD; considerable postpharyngeal swelling  Attempt cortrak placement today   Best practice:  Diet: NPO Pain/Anxiety/Delirium protocol (if indicated): fentanyl/ versed/ nimbex VAP protocol (if indicated): yes DVT prophylaxis: heparin SQ GI prophylaxis: pepcid Glucose control: insulin gtt per protocol Mobility: BR Code Status: Full  Family Communication: per primary  Disposition:  ICU  Labs   CBC: Recent Labs  Lab 06/15/2018 0733 07/05/2018 2000 06/16/2018 2324 06/18/18 0112 06/18/18 0312 06/18/18 0450  WBC 9.7  --   --   --   --  20.0*  HGB 11.4* 10.5* 11.2* 11.6* 10.9* 11.3*  HCT 35.9* 31.0* 33.0* 34.0* 32.0* 34.1*  MCV 103.8*  --   --   --   --  100.3*  PLT 253  --   --   --   --  248    Basic Metabolic Panel: Recent Labs  Lab 06/19/2018 0733 06/18/2018 0807 06/25/2018 1811 06/30/2018 2000 06/13/2018 2014 06/23/2018 2324 06/18/18 0112 06/18/18 0312 06/18/18 0450  NA 135  --  132* 132* 134* 135 133* 135 133*  K 5.9* 3.2* 7.4* 4.7 4.8 5.4* 5.0 4.8 5.3*  CL 93*  --  93* 95* 95* 99  --   --  95*  CO2 27  --  20*  --  18*  --   --   --  21*  GLUCOSE 89  --  211* 521* 526* 272* 278* 249* 235*  BUN 59*  --  62* 49* 55* 48*  --   --  61*  CREATININE 7.76*  --  7.93* 6.80* 6.81* 7.20*  --   --  7.68*  CALCIUM 8.3*  --  7.8*  --  9.0  --   --   --  8.6*  MG  --  1.0* 1.7  --   --   --   --   --  3.2*  PHOS  --   --   --   --   --   --   --   --  2.7   GFR: Estimated  Creatinine Clearance: 7 mL/min (A) (by C-G formula based on SCr of 7.68 mg/dL (H)). Recent Labs  Lab 06/10/2018 0733 06/18/18 0450  WBC 9.7 20.0*    Liver Function Tests: No results for input(s): AST, ALT, ALKPHOS, BILITOT, PROT, ALBUMIN in the last 168 hours. No results for input(s): LIPASE, AMYLASE in the last 168 hours. No results for input(s): AMMONIA in the last 168 hours.  ABG    Component Value Date/Time   PHART 7.439 06/18/2018 0424   PCO2ART 33.5 06/18/2018 0424   PO2ART 216 (H) 06/18/2018 0424   HCO3 22.3 06/18/2018 0424   TCO2 21 (L) 06/10/2018 2324   ACIDBASEDEF 1.2 06/18/2018 0424   O2SAT 99.2 06/18/2018 0424     Coagulation Profile: Recent Labs  Lab 06/18/18 0206  INR 1.0    Cardiac Enzymes: Recent Labs  Lab 06/09/2018 2014  TROPONINI 0.52*    HbA1C: Hgb A1c MFr Bld  Date/Time Value Ref Range Status  04/11/2018 09:30 AM 5.4 4.6 - 6.5 % Final    Comment:    Glycemic Control Guidelines for People with Diabetes:Non Diabetic:  <6%Goal of Therapy: <7%Additional Action Suggested:  >8%   10/21/2017 09:25 AM 5.5 4.6 - 6.5 % Final    Comment:    Glycemic Control Guidelines for People with Diabetes:Non Diabetic:  <6%Goal of Therapy: <7%Additional Action Suggested:  >8%     CBG: Recent Labs  Lab 06/18/18 0417 06/18/18 0443 06/18/18 0612 06/18/18 0642 06/18/18 0821  GLUCAP 242* 228* 185* 176* 137*    Review of Systems:   Unable  Past Medical History  He,  has a past medical history of Anemia, Anxiety, Arthritis, BPH (benign prostatic hyperplasia), CAD (coronary artery disease) of bypass graft (2006, 2012), CAD in native artery; and the grafts (1992, 1997, 2002, 2006, 2012), CAD S/P percutaneous coronary angioplasty (October 2012; Feb, Apr & Oct 2015; Feb 2016), Carotid arterial disease [facility]), Chronic low back pain, Chronic stable angina ([place]), Colon cancer ([place]) (2003), Diabetes mellitus with chronic kidney disease on chronic dialysis, without long-term  current use of insulin ([place]),  Dyspnea, End stage renal disease on dialysis [facility]), Gout, History of hiatal hernia, History of Non-ST elevated myocardial infarction (non-STEMI) (1992, 2006, 2012; 02/2013), Hyperlipidemia, Hypertension, Hypothyroidism, Ischemic cardiomyopathy, LBBB (left bundle branch block), Peptic ulcer disease ( 2004), Plavix resistance, Pneumonia, Skin cancer (02/1999), and Stroke ([place]) (1998).   Surgical History    Past Surgical History:  Procedure Laterality Date   AORTIC ARCH ANGIOGRAPHY N/A 05/09/2016   Procedure: Aortic Arch Angiography;  Surgeon: Waynetta Sandy, MD;  Location: [place] [facility];  Service: Cardiovascular;  Laterality: N/A;   APPENDECTOMY     AV FISTULA REPAIR Left 05/2009   CARDIAC CATHETERIZATION  10/16/2010   patent  LIMA to the LAD , PATENT  svg TO 2nd marginals ,occluded PLA with collaterals and SVG to the OM  had 90% stenosis  was txlge  bare - metal  3.5  Integrity ten  postdilate  36-37  mm     CARDIAC CATHETERIZATION  03/22/2004   loss of both SVG,severe disease in prox and ostial  circ not amenable to invention. mod diease distal LAD  AFTER BYPASS GRAFT;-CVTS to evaluate    CARDIAC CATHETERIZATION N/A 08/03/2014   Procedure: Left Heart Cath and Cors/Grafts Angiography;  Surgeon: Leonie Man, MD;  Location: [place] [facility];  Service: Cardiovascular;  Laterality: N/A;   CARDIAC CATHETERIZATION N/A 08/03/2014   Procedure: Coronary Balloon Angioplasty;  Surgeon: Leonie Man, MD;  Location: [place] [facility];  Service: Cardiovascular;: Aggressive Cutting Balloon - [place] Balloon PTCA of ISR site in mRCA (3 stent layer)   CAROTID DOPPLER  05/23/2012   ABN CAROTID-- RGT BULB/PROX ICA mild to mod 50-60%;lft bulb/prox mild to mod 0-49%;left subclavian abn waveforms consistent with patients lft arm A/V fistula   CATARACT EXTRACTION W/ INTRAOCULAR LENS  IMPLANT, BILATERAL Bilateral    CHOLECYSTECTOMY  2009   COLON RESECTION  2003    transverse and proximal descending w/ primar anastomosis   COLON SURGERY  2003   "cancer"   COLONOSCOPY     CORONARY ANGIOPLASTY  04/03/1995   OM and Circ   CORONARY ANGIOPLASTY  02/01/2000   CIRC   CORONARY ANGIOPLASTY  02/09/14   Cutting Balloon PTCA of mRCA ISR 99% - 3.5 mm   CORONARY ARTERY BYPASS GRAFT  08/22/1990   INITIAL:LIMA to LAD, SVG to  Diagonal, SVG to OM   CORONARY ARTERY BYPASS GRAFT  2006   SVG to OM1,SVG to OM2 with patent LIMA  to LAD and occluded vein  graft to the diagonal  and occluded vein grft to OM from  prior  surgery   CORONARY ATHERECTOMY  05/21/2016   Successful PTCA and cutting balloon atherectomy of mid dominant RCA "in-stent restenosis of the lesion that had been intervened on close to 2 years ago with an excellent result.    CORONARY ATHERECTOMY  06/11/2018   CORONARY ATHERECTOMY N/A 07/06/2018   Procedure: CORONARY ATHERECTOMY;  Surgeon: Leonie Man, MD;  Location: [place] [facility];  Service: Cardiovascular;  Laterality: N/A;   CORONARY BALLOON ANGIOPLASTY N/A 05/21/2016   Procedure: Coronary Balloon Angioplasty;  Surgeon: Lorretta Harp, MD;  Location: [place] [facility];  Service: Cardiovascular:: [place] Cutting Balloon angioplasty RCA ISR   CORONARY STENT INTERVENTION N/A 06/20/2018   Procedure: CORONARY STENT INTERVENTION;  Surgeon: Leonie Man, MD;  Location: [place] [facility];  Service: Cardiovascular;  Laterality: N/A;   DISTAL REVASCULARIZATION AND INTERVAL LIGATION (DRIL) Left 05/14/2016   Procedure: DISTAL REVASCULARIZATION  AND INTERVAL LIGATION (DRIL) LEFT ARM;  Surgeon: Waynetta Sandy, MD;  Location: [place];  Service: Vascular;  Laterality: Left;   DOPPLER ECHOCARDIOGRAPHY  01/25/2012   EF 50 to 55%   EVENT MONITOR  01/23/2012-02/06/2012   SINUS ,LBBB,unifocal PVCs   LEFT AND RIGHT HEART CATHETERIZATION WITH CORONARY/GRAFT ANGIOGRAM N/A 04/20/2014   Procedure: LEFT AND RIGHT HEART CATHETERIZATION WITH  Beatrix Fetters;  Surgeon: Sherren Mocha, MD;  Location: [facility] CATH LAB;  Service: Cardiovascular;  Laterality: N/A;   LEFT HEART CATH AND CORS/GRAFTS ANGIOGRAPHY N/A 05/21/2016   Procedure: LEFT HEART CATH AND CORS/GRAFTS ANGIOGRAPHY;  Surgeon: Lorretta Harp, MD;  Location: [place] [facility];  Service: Cardiovascular;; 95% in-stent restenosis of mid RCA treated with Cutting Balloon PTCA.  6% ramus.  Patent LIMA-LAD and SVG-OM.  Distal LAD and OM 2 -75% after graft insertion   LEFT HEART CATH AND CORS/GRAFTS ANGIOGRAPHY N/A 06/13/2018   Procedure: LEFT HEART CATH AND CORS/GRAFTS ANGIOGRAPHY;  Surgeon: Leonie Man, MD;  Location: [place] [facility];  Service: Cardiovascular;  Laterality: N/A;   LEFT HEART CATHETERIZATION WITH CORONARY ANGIOGRAM N/A 02/23/2013   Procedure: LEFT HEART CATHETERIZATION WITH CORONARY ANGIOGRAM;  Surgeon: Lorretta Harp, MD;  Location: [facility] CATH LAB;  Service: Cardiovascular;  Laterality: N/A;   LEFT HEART CATHETERIZATION WITH CORONARY/GRAFT ANGIOGRAM N/A 04/30/2013   Procedure: LEFT HEART CATHETERIZATION WITH Beatrix Fetters;  Surgeon: Leonie Man, MD;  Location: [facility] CATH LAB;  Service: Cardiovascular;  Laterality: N/A;   LEFT HEART CATHETERIZATION WITH CORONARY/GRAFT ANGIOGRAM N/A 10/15/2013   Procedure: LEFT HEART CATHETERIZATION WITH Beatrix Fetters;  Surgeon: Leonie Man, MD;  Location: [facility] CATH LAB;  Service: Cardiovascular;  Laterality: N/A;   LEFT HEART CATHETERIZATION WITH CORONARY/GRAFT ANGIOGRAM N/A 02/09/2014   Procedure: LEFT HEART CATHETERIZATION WITH Beatrix Fetters;  Surgeon: Leonie Man, MD;  Location: [facility] CATH LAB;  Service: Cardiovascular;  Laterality: N/A;   Lower Extremity Arterial Dopplers  October 2014   Calcified but non-occlusive peripheral arteries. No evidence of stenosis   NM MYOCAR PERF WALL MOTION  10/12/2011   EF 54%,LV normal ; no signifiant ischemia   PERCUTANEOUS CORONARY STENT  INTERVENTION (PCI-S)  02/23/2013   mid RCA 80% & 70% - Xience 2.75 mm x 15 mm ( 3.64mm) ; LIMA-LAD patent, SVG-OM patent (stent patent); SVG-Diag patent.   PERCUTANEOUS CORONARY STENT INTERVENTION (PCI-S)  April 2015   dRCA - Integrity Resolute DES   2.25 mm x 79mm (2.5 mm)   PERCUTANEOUS CORONARY STENT INTERVENTION (PCI-S)  Oct 15 2013   [place] Angina: mRCA ISR with post-stent stenosis -- Cuting PTCA & PCI Promus Premier DES 2.75 mm x 16 mm (3.25 mm)   POSTERIOR LUMBAR FUSION     REVISON OF ARTERIOVENOUS FISTULA Left 98/33/8250   Procedure: PLICATION OF LEFT UPPER ARM  ARTERIOVENOUS FISTULA  ANEURYSM;  Surgeon: Rosetta Posner, MD;  Location: [facility] OR;  Service: Vascular;  Laterality: Left;   TRANSTHORACIC ECHOCARDIOGRAM  02/23/2013   Moderate concentric hypertrophy. EF 45-50%. Septal bounce. Grade 2 diastolic dysfunction (pseudo-normal - severely elevated filling pressures) mild to moderate MR and moderate LA dilation to   UPPER EXTREMITY ANGIOGRAPHY Left 05/09/2016   Procedure: Upper Extremity Angiography;  Surgeon: Waynetta Sandy, MD;  Location: [place] [facility];  Service: Cardiovascular;  Laterality: Left;     Social History   reports that he has never smoked. He has never used smokeless tobacco. He reports that he does not drink alcohol or  use drugs.   Family History   His family history includes Diabetes in his father and mother; Heart attack in his father and mother; Heart disease in his father and mother; Hypertension in his father and mother; Stroke in his paternal aunt and paternal uncle. There is no history of Colon cancer or Esophageal cancer.   Allergies Allergies  Allergen Reactions   Brilinta [Ticagrelor] Shortness Of Breath   Shellfish Allergy Other (See Comments)    Causes gout flare-ups     Home Medications  Prior to Admission medications   Medication Sig Start Date End Date Taking? Authorizing Provider  acetaminophen (TYLENOL) 500 MG tablet Take 1,000 mg  by mouth 2 (two) times daily as needed for mild pain.    Yes [provider]  aspirin 81 MG chewable tablet Chew 81 mg by mouth once. 07/03/2018 06/30/2018 Yes [provider]  atorvastatin (LIPITOR) 40 MG tablet Take 1 tablet (40 mg total) by mouth daily. Patient taking differently: Take 40 mg by mouth every evening.  04/25/17  Yes Midge Minium, MD  cinacalcet (SENSIPAR) 30 MG tablet Take 30 mg by mouth at bedtime.    Yes [provider]  clonazePAM (KLONOPIN) 0.5 MG tablet Take 1 tablet (0.5 mg total) by mouth 2 (two) times daily as needed. for anxiety Patient taking differently: Take 0.5 mg by mouth 2 (two) times daily as needed for anxiety.  04/11/18  Yes Midge Minium, MD  clopidogrel (PLAVIX) 75 MG tablet Take 1 tablet (75 mg total) by mouth daily. 07/24/17  Yes Leonie Man, MD  ferric citrate (AURYXIA) 1 GM 210 MG(Fe) tablet Take 210-420 mg by mouth See admin instructions. Take 420 mg with each meal and 210 mg with each snack   Yes [provider]  isosorbide mononitrate (IMDUR) 120 MG 24 hr tablet TAKE 1 TABLET BY MOUTH ONCE DAILY Patient taking differently: Take 120 mg by mouth daily.  03/04/18  Yes Leonie Man, MD  levothyroxine (SYNTHROID) 125 MCG tablet TAKE 1 TABLET BY MOUTH ONCE DAILY BEFORE BREAKFAST Patient taking differently: Take 125 mcg by mouth daily before breakfast.  05/26/18  Yes Midge Minium, MD  lidocaine-prilocaine (EMLA) cream Apply 1 application topically daily as needed (port access).    Yes [provider]  Melatonin 5 MG CHEW Chew 5 mg by mouth at bedtime as needed (sleep).   Yes [provider]  metoprolol succinate (TOPROL-XL) 25 MG 24 hr tablet Take 1 tablet (25 mg total) by mouth daily. Patient taking differently: Take 25 mg by mouth See admin instructions. Take 25 mg in the morning on Sun, Tues, Thurs, and Sat 06/05/17  Yes Leonie Man, MD  nitroGLYCERIN (NITROSTAT) 0.4 MG SL tablet DISSOLVE  ONE TABLET UNDER THE TONGUE EVERY 5 MINUTES AS NEEDED FOR CHEST PAIN. UP TO 3 TIMES AN EPISODE Patient taking differently: Place 0.4 mg under the tongue every 5 (five) minutes x 3 doses as needed for chest pain.  10/30/17  Yes Leonie Man, MD  nortriptyline (PAMELOR) 25 MG capsule Take 1 capsule (25 mg total) by mouth at bedtime. 10/21/17  Yes Midge Minium, MD  nystatin (MYCOSTATIN) 100000 UNIT/ML suspension Take 5 mLs by mouth 4 (four) times daily.   Yes [provider]  Besifloxacin HCl (BESIVANCE) 0.6 % SUSP Place 1 drop into both eyes See admin instructions. Use eye drops 2 times daily for 2 days following injection by Dr. Zigmund Daniel (every 10 weeks)  [provider]  glucose blood (TRUE METRIX BLOOD GLUCOSE TEST) test strip 1 each by Other route as needed for other. Use as instructed to test sugars 2-3 times daily. Dx. E11.40 11/05/14   Midge Minium, MD     This patient is critically ill with multiple organ system failure; which, requires frequent high complexity decision making, assessment, support, evaluation, and titration of therapies. This was completed through the application of advanced monitoring technologies and extensive interpretation of multiple databases. During this encounter critical care time was devoted to patient care services described in this note for 34 minutes.   Garner Nash, DO [facility] 06/18/2018 9:03 AM  Personal pager: [phone] If unanswered, please page CCM On-call: [phone]

## 2018-06-19 DIAGNOSIS — Z794 Long term (current) use of insulin: Secondary | ICD-10-CM

## 2018-06-19 DIAGNOSIS — J9601 Acute respiratory failure with hypoxia: Secondary | ICD-10-CM

## 2018-06-19 DIAGNOSIS — E0840 Diabetes mellitus due to underlying condition with diabetic neuropathy, unspecified: Secondary | ICD-10-CM

## 2018-06-19 LAB — BASIC METABOLIC PANEL WITH GFR
Anion gap: 12 (ref 5–15)
BUN: 36 mg/dL — ABNORMAL HIGH (ref 8–23)
CO2: 22 mmol/L (ref 22–32)
Calcium: 8.1 mg/dL — ABNORMAL LOW (ref 8.9–10.3)
Chloride: 101 mmol/L (ref 98–111)
Creatinine, Ser: 4.55 mg/dL — ABNORMAL HIGH (ref 0.61–1.24)
GFR calc Af Amer: 13 mL/min — ABNORMAL LOW
GFR calc non Af Amer: 12 mL/min — ABNORMAL LOW
Glucose, Bld: 120 mg/dL — ABNORMAL HIGH (ref 70–99)
Potassium: 5.5 mmol/L — ABNORMAL HIGH (ref 3.5–5.1)
Sodium: 135 mmol/L (ref 135–145)

## 2018-06-19 LAB — RENAL FUNCTION PANEL
Albumin: 2.9 g/dL — ABNORMAL LOW (ref 3.5–5.0)
Albumin: 2.6 g/dL — ABNORMAL LOW (ref 3.5–5.0)
Anion gap: 12 (ref 5–15)
Anion gap: 9 (ref 5–15)
BUN: 33 mg/dL — ABNORMAL HIGH (ref 8–23)
BUN: 23 mg/dL (ref 8–23)
CO2: 22 mmol/L (ref 22–32)
CO2: 23 mmol/L (ref 22–32)
Calcium: 8.4 mg/dL — ABNORMAL LOW (ref 8.9–10.3)
Calcium: 8.3 mg/dL — ABNORMAL LOW (ref 8.9–10.3)
Chloride: 102 mmol/L (ref 98–111)
Chloride: 103 mmol/L (ref 98–111)
Creatinine, Ser: 4.36 mg/dL — ABNORMAL HIGH (ref 0.61–1.24)
Creatinine, Ser: 3.44 mg/dL — ABNORMAL HIGH (ref 0.61–1.24)
GFR calc Af Amer: 14 mL/min — ABNORMAL LOW (ref >60)
GFR calc Af Amer: 19 mL/min — ABNORMAL LOW
GFR calc non Af Amer: 12 mL/min — ABNORMAL LOW (ref >60)
GFR calc non Af Amer: 16 mL/min — ABNORMAL LOW
Glucose, Bld: 130 mg/dL — ABNORMAL HIGH (ref 70–99)
Glucose, Bld: 104 mg/dL — ABNORMAL HIGH (ref 70–99)
Phosphorus: 2.5 mg/dL (ref 2.5–4.6)
Phosphorus: 3.9 mg/dL (ref 2.5–4.6)
Potassium: 6.1 mmol/L — ABNORMAL HIGH (ref 3.5–5.1)
Potassium: 5.2 mmol/L — ABNORMAL HIGH (ref 3.5–5.1)
Sodium: 136 mmol/L (ref 135–145)
Sodium: 135 mmol/L (ref 135–145)

## 2018-06-19 LAB — MAGNESIUM: Magnesium: 2.5 mg/dL — ABNORMAL HIGH (ref 1.7–2.4)

## 2018-06-19 LAB — CBC
HCT: 37.9 % — ABNORMAL LOW (ref 39.0–52.0)
Hemoglobin: 12.4 g/dL — ABNORMAL LOW (ref 13.0–17.0)
MCH: 32.2 pg (ref 26.0–34.0)
MCHC: 32.7 g/dL (ref 30.0–36.0)
MCV: 98.4 fL (ref 80.0–100.0)
Platelets: 269 10*3/uL (ref 150–400)
RBC: 3.85 MIL/uL — ABNORMAL LOW (ref 4.22–5.81)
RDW: 14.3 % (ref 11.5–15.5)
WBC: 19.5 10*3/uL — ABNORMAL HIGH (ref 4.0–10.5)
nRBC: 0 % (ref 0.0–0.2)

## 2018-06-19 LAB — GLUCOSE, CAPILLARY
Glucose-Capillary: 120 mg/dL — ABNORMAL HIGH (ref 70–99)
Glucose-Capillary: 125 mg/dL — ABNORMAL HIGH (ref 70–99)
Glucose-Capillary: 146 mg/dL — ABNORMAL HIGH (ref 70–99)
Glucose-Capillary: 115 mg/dL — ABNORMAL HIGH (ref 70–99)
Glucose-Capillary: 97 mg/dL (ref 70–99)
Glucose-Capillary: 149 mg/dL — ABNORMAL HIGH (ref 70–99)
Glucose-Capillary: 91 mg/dL (ref 70–99)
Glucose-Capillary: 105 mg/dL — ABNORMAL HIGH (ref 70–99)

## 2018-06-19 MED ORDER — CHLORHEXIDINE GLUCONATE CLOTH 2 % EX PADS
6.0000 | MEDICATED_PAD | Freq: Every day | CUTANEOUS | Status: DC
  Administered 2018-06-19 – 2018-06-22 (×3): 6 via TOPICAL

## 2018-06-19 MED ORDER — PRISMASOL BGK 0/2.5 32-2.5 MEQ/L IV SOLN
INTRAVENOUS | Status: DC
  Administered 2018-06-19 – 2018-06-21 (×13): via INTRAVENOUS_CENTRAL
  Filled 2018-06-19 (×24): qty 5000

## 2018-06-19 MED ORDER — ASPIRIN 300 MG RE SUPP
150.0000 mg | Freq: Every day | RECTAL | Status: DC
  Administered 2018-06-19 – 2018-06-20 (×2): 150 mg via RECTAL
  Filled 2018-06-19 (×2): qty 1

## 2018-06-19 NOTE — Plan of Care (Signed)
  Problem: Clinical Measurements: Goal: Respiratory complications will improve Outcome: Progressing Goal: Cardiovascular complication will be avoided Outcome: Progressing   Problem: Coping: Goal: Level of anxiety will decrease Outcome: Progressing   Problem: Pain Managment: Goal: General experience of comfort will improve Outcome: Progressing   Problem: Safety: Goal: Ability to remain free from injury will improve Outcome: Progressing   Problem: Skin Integrity: Goal: Risk for impaired skin integrity will decrease Outcome: Progressing   Problem: Cardiac: Goal: Ability to achieve and maintain adequate cardiopulmonary perfusion will improve Outcome: Progressing   Problem: Neurologic: Goal: Promote progressive neurologic recovery Outcome: Progressing Note: Patient is now alert and following commands   Problem: Skin Integrity: Goal: Risk for impaired skin integrity will be minimized. Outcome: Progressing   Problem: Cardiovascular: Goal: Vascular access site(s) Level 0-1 will be maintained Outcome: Progressing

## 2018-06-19 NOTE — Progress Notes (Signed)
ANTICOAGULATION CONSULT NOTE - Initial Consult  Pharmacy Consult for Cangrelor  Indication: DES s/p PCI   Allergies  Allergen Reactions  . Brilinta [Ticagrelor] Shortness Of Breath  . Shellfish Allergy Other (See Comments)    Causes gout flare-ups    Patient Measurements: Height: '5\' 5"'$  (165.1 cm) Weight: 157 lb 6.5 oz (71.4 kg) IBW/kg (Calculated) : 61.5  Vital Signs: Temp: 95.2 F (35.1 C) (06/11 1000) Temp Source: Core (06/11 1000) BP: 101/30 (06/11 1000) Pulse Rate: 61 (06/11 0736)  Labs: Recent Labs    06/12/2018 0733  06/18/2018 2014  06/18/18 0206 06/18/18 0312 06/18/18 0450 06/18/18 1000  06/18/18 1613 06/19/18 0022 06/19/18 0454 06/19/18 0455  HGB 11.4*   < >  --    < >  --  10.9* 11.3*  --   --   --   --   --  12.4*  HCT 35.9*   < >  --    < >  --  32.0* 34.1*  --   --   --   --   --  37.9*  PLT 253  --   --   --   --   --  260  --   --   --   --   --  269  APTT  --   --   --   --   --   --   --  33  --   --   --   --   --   LABPROT  --   --   --   --  13.2  --   --  14.6  --   --   --   --   --   INR  --   --   --   --  1.0  --   --  1.2  --   --   --   --   --   CREATININE 7.76*   < > 6.81*   < >  --   --  7.68*  --    < > 6.11* 4.55* 4.36*  --   TROPONINI  --   --  0.52*  --   --   --   --   --   --   --   --   --   --    < > = values in this interval not displayed.    Estimated Creatinine Clearance: 12.3 mL/min (A) (by C-G formula based on SCr of 4.36 mg/dL (H)).   Medical History: Past Medical History:  Diagnosis Date  . Anemia    Likely secondary to history of GI bleed  . Anxiety   . Arthritis   . BPH (benign prostatic hyperplasia)   . CAD (coronary artery disease) of bypass graft 2006, 2012   In 2006: Occluded SVG-OM noted (SVG-diagonal was previously occluded); 2012: Severe lesion in redo SVG-OM1 --> BMS PCI   . CAD in native artery; and the grafts 1992, 1997, 2002, 2006, 2012   CABG 1992 and redo CABG 2006  . CAD S/P percutaneous coronary  angioplasty October 2012; Feb, Apr & Oct 2015; Feb 2016   a) s/p CABG 1992 - redo 2006. b) 10/12: NSTEMI: PCI-SVG-OM1 Integrity BMS 3.5 x 15 (3.85 mm) c) 2/15: UA - PCI mRCA Xience Ap DES 2.75 x 15 (3.1 mm). d) 4/15 : UA - focal dRCA Resolute DES 2.25 x 8 (2.5 mm). e) 10/15: PCI- mRCA ISR w/ post  stent - Promus P 2.75 x 16 (3.25 mm). f) 2/16: NSTEMI: CBA/PTCA mRCA ISR (3.5 mm); g) 7/16 ISR RCA->CBA/PTCA, -- Most recent event: 05/11/2: CBA of ISR.  Marland Kitchen Carotid arterial disease ([place])    a) Duplex 05/2012: R BULB/PROX ICA: 50-69%, L BULB/PROX ICA: 0-49%. ;; 04/29/2014: 19-41% RICA, 74-08% LICA. Patetn vertebrals,  . Chronic low back pain   . Chronic stable angina (HCC)    Chronic stable  . Colon cancer [facility]) 2003   colectomy for CA. no recurrence . never required  radiation or chem  . 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 Dyspnea    "when I get too much Fluid"  . End stage renal disease on dialysis [facility])    Dialyzes at [facility]: Tues/Th/Sat - left upper cavity AV fistula brachiocephalic  . Gout   . History of hiatal hernia   . History of Non-ST elevated myocardial infarction (non-STEMI) 1992, 2006, 2012; 02/2013  . Hyperlipidemia   . Hypertension   . Hypothyroidism    On supplementation  . Ischemic cardiomyopathy    a) EF 45-50% by echo 2015. b) EF 50% by cath 04/2014.  Marland Kitchen LBBB (left bundle branch block)    Chronic  . Peptic ulcer disease  2004  . Plavix resistance    a) P2Y12 264 in 02/2014 - put on Brilinta but did not tolerate due to SOB. b) cannot be on Effient due to history of stroke. c) Plavix increased to '75mg'$  BID and Pletal added 04/2014 due to ISR.  Marland Kitchen Pneumonia   . Skin cancer 02/1999   nose  . Stroke [facility]) 1998    Medications:  Scheduled:  . artificial tears  1 application Both Eyes X4G  . aspirin  150 mg Rectal Daily  . atorvastatin  40 mg Oral QPM  . chlorhexidine gluconate (MEDLINE KIT)   15 mL Mouth Rinse BID  . Chlorhexidine Gluconate Cloth  6 each Topical Daily  . Chlorhexidine Gluconate Cloth  6 each Topical Q0600  . cinacalcet  30 mg Oral Q supper  . doxercalciferol  1 mcg Intravenous Q T,Th,Sa-HD  . fentaNYL (SUBLIMAZE) injection  50 mcg Intravenous Once  . ferric citrate  420 mg Oral TID WC  . heparin  5,000 Units Subcutaneous Q8H  . insulin aspart  1-3 Units Subcutaneous Q4H  . insulin detemir  5 Units Subcutaneous Q12H  . levothyroxine  62.5 mcg Intravenous Daily  . mouth rinse  15 mL Mouth Rinse 10 times per day  . midazolam  1 mg Intravenous Once  . nystatin  5 mL Oral QID  . sodium chloride flush  10-40 mL Intracatheter Q12H  . sodium chloride flush  3 mL Intravenous Q12H    Assessment: [name] [name] is a 78 year old male s/p antherectomy based DES PCI of ostial-proximal RCA on 6/9. PMH significant for extensive CAD, s/p CABG with multiple PCI and PTCA interventions on RCA for in-stent resetnosis, ischemic cardimyoptahy, ESRD on HD TTS. Pharmacy consulted for cangrelor infusion while patient is NPO and awaiting cortak placement trial for this Friday 6/12. Patient did not receive any plavix loading during PCI on 6/9 per Dr.Kelly's conversation with Dr.Harding due to the patient being on plavix PTA daily, last dose was at home on 06/16/08.  Patient is sedated and intubated in bed this morning, no signs or symptoms of bleeding note and on examination by Dr.Kelly while rounding.   CBC improving  with hgb at 12.4, hct 37.9, platelets 269. Awaiting enteral access via Cortrak placement tomorrow. Continue   Goal of Therapy:  Anti-platelet coverage for secondary prevention Monitor platelets by anticoagulation protocol: Yes   Plan:  Continue cangrelor IV infusion at 0.88mg/kg/min Monitor signs/symptoms of bleeding, Daily CBC Follow-up cortrak placement on Friday to transition to back to plavix.   Thank you for the interesting consult and for involving pharmacy in this  patient's care.  JTamela Gammon PharmD 06/19/2018 10:14 AM PGY-1 Pharmacy Resident Direct Phone: [phone] check [email or web address] for unit-specific pharmacist phone numbers

## 2018-06-19 NOTE — Progress Notes (Signed)
Nutrition Follow-up  RD working remotely.  DOCUMENTATION CODES:   Not applicable  INTERVENTION:   - Verbal with readback order placed for Cortrak NG tube, Cortrak team will be available tomorrow, 6/12, from 0800 to 1600  - If unable to place Cortrak, recommend TPN  Once Cortrak in place: - Start Vital 1.5 @ 25 ml/hr and increase rate by 10 ml Q 4 hours to goal rate of 45 ml/hr (1080 ml daily) - Pro-stat 30 ml TID  Tube feeding regimen at goal provides 1920 kcal, 118 grams of protein, and 821 ml of H2O (100% of needs).  - Recommend addition of B complex with vitamin C  NUTRITION DIAGNOSIS:   Increased nutrient needs related to acute illness as evidenced by estimated needs.  Ongoing  GOAL:   Provide needs based on ASPEN/SCCM guidelines  Unmet at this time due to no enteral access  MONITOR:   Diet advancement, TF tolerance, Skin, Vent status, Weight trends, Labs, I & O's  REASON FOR ASSESSMENT:   Ventilator    ASSESSMENT:   Patient with PMH significant for CAD s/p CABG with multiple PCI and PTCA interventions, ischemic cardiomyopathy, ESRD on HD, DM, HTN, HLD, PUD, CVA, and colon cancer s/p resection. Presents this admission VT cardiac arrest during iHD treatment.    6/10 - CRRT initiated, OGT placement attempts all unsuccessful 6/11 - rewarming started  Pt remains without enteral access. Paged Dr. Nelda Marseille. Verbal with readback order placed for Cortrak. If Cortrak team unable to advance tube into stomach, recommend TPN.  Nimbex off this afternoon. Pt remains on CRRT. Per RN edema assessment, pt with generalized edema.  Patient is currently intubated on ventilator support. Remains on pressor support with Levophed. MV: 7.5 L/min Temp (24hrs), Avg:93.2 F (34 C), Min:90.3 F (32.4 C), Max:97.5 F (36.4 C) BP (a-line): 129/42 MAP (a-line): 65  Medications reviewed and include: Sensipar, Hectorol, ferric citrate, SSI, Levemir 5 units BID, IV Pepcid  Drips:  Amiodarone, Kengreal, Fentanyl, Versed, Levophed  Labs reviewed: potassium 6.1, magnesium 2.5 CBG's: 115-147 x 24 hours  UOP: 25 ml x 24 hours CRRT UF: 1637 ml x 24 hours I/O's: +653 ml since admit  NUTRITION - FOCUSED PHYSICAL EXAM:  Unable to complete at this time. RD working remotely.  Diet Order:   Diet Order            Diet NPO time specified  Diet effective now              EDUCATION NEEDS:   Not appropriate for education at this time  Skin:  Skin Assessment: Reviewed RN Assessment  Last BM:  no documented BM  Height:   Ht Readings from Last 1 Encounters:  06/10/2018 5\' 5"  (1.651 m)    Weight:   Wt Readings from Last 1 Encounters:  06/19/18 71.4 kg   EDW: 64.5 kg  Ideal Body Weight:  61.8 kg  BMI:  Body mass index is 26.19 kg/m.  Estimated Nutritional Needs:   Kcal:  1905 kcal  Protein:  110-130 grams  Fluid:  >/= 2 L/day     Gaynell Face, MS, RD, LDN Inpatient Clinical Dietitian Pager: [phone] Weekend/After Hours: [phone]

## 2018-06-19 NOTE — Progress Notes (Signed)
[address] [place], RN & Verdia Kuba, RN at bedside when patient began rewarming this morning. Patient stable and Arctic sun showing accurate readings.   Will continue to monitor.   Jonahtan Manseau E Reola Mosher, [place]

## 2018-06-19 NOTE — Progress Notes (Signed)
Assisted tele visit to patient with family member.  Edison Wollschlager Anderson, RN   

## 2018-06-19 NOTE — Progress Notes (Signed)
[facility] [place] Progress Note Patient Name: AJ CRUNKLETON DOB: 03/20/1940 MRN: [id number]   Date of Service  06/19/2018  HPI/Events of Note  K+ = 6.1. Patient is on CRRT.   eICU Interventions  Defer management of K+ to Nephrology Service.      Intervention Category Major Interventions: Electrolyte abnormality - evaluation and management  Rowyn Mustapha Eugene 06/19/2018, 6:02 AM

## 2018-06-19 NOTE — Progress Notes (Signed)
K+ of 6.1 reported to both E-Link MD (Dr. Oletta Darter) and Nephrology MD on call. Patient is still on CRRT and vitals are stable. No new orders have been received.   Will continue to monitor.   Chasiti Waddington E Reola Mosher, [place]

## 2018-06-19 NOTE — Progress Notes (Signed)
NAME:  [name] [name], MRN:  [id number], DOB:  [birth year], LOS: 1 ADMISSION DATE:  06/09/2018, CONSULTATION DATE:  06/29/2018 REFERRING MD:  Code team, CHIEF COMPLAINT:  Cardiac arrest  Brief History   78 year old male admitted for progressive angina s/p successful balloon angioplasty of the mid RCA and atherectomy based DES PCI of the ostial-proximal RCA 6/9 am.  Had ongoing VT throughout the day attributed to hypokalemia and hypomagnesemia.  Prior to [facility], he was noted to have K 7.4.  Had VT cardiac arrest shortly after starting iHD with ROSC after 20 mins.  Intubated and transferred to ICU, remains unresponsive.    History of present illness   HPI obtained from medical chart review as patient is intubated and sedated on mechanical ventilation.    78 year old male with complex history of extensive CAD s/p CABG with multiple PCI and PTCA interventions on the RCA for in-stent restenosis, Ischemic cardiomyopathy, ERSD on HD, DMT2, HTN, HLD, PUD, anemia, hypothyroidism, stroke, colon cancer s/p resection admitted 6/9 with progressive angina.   He had reported he was taking up to 2 bottles of nitroglycerin a month with angina at rest and waking him up at night with some ongoing hypotension on his delayed follow-up cardiology appointment on 5/28.  He underwent cardiac catheterization on 6/5 which showed modest progression of his mid RCA in-stent restenosis but new de novo lesion in the proximal RCA that would require staged atherectomy based PCI.  Therefore he was admitted 6/9 and underwent successful balloon angioplasty of the mid RCA and atherectomy based DES PCI of the ostial-proximal RCA; he required defibrillation x 1 during case for ventricular tachycardia.  Throughout the day he had intermittent episode of asymptomatic VT and noted to have hypokalemia and hypomagnesemia which were replaced and he was started on amiodarone drip.  He had also complained of ongoing chest pain without relief after sublingual  nitro. He was noted to have K 7.2 prior to dialysis.  He was started on iHD and after 7 minutes decompensated to VT cardiac arrest with ROSC obtained after 20 mins with ACLS measures including temporizing measures for hyperkalemia, lidocaine, amiodarone, and defibrillation x 2.  He was intubated by RT.  Post arrest, transferred to ICU.  He remained unresponsive and normotensive in SR.  PCCM consulted for further medical and ventilator management.  Nephrology has asked for hemodialysis catheter to initiate CRRT.   Past Medical History  Extensive CAD s/p CABG with multiple PCI and PTCA interventions on the RCA for in-stent restenosis, Ischemic cardiomyopathy, ERSD on HD, DMT2, HTN, HLD, PUD, anemia, hypothyroidism, stroke, colon cancer s/p resection  Significant Hospital Events   6/9 Admit/ cardiac arrest  Consults:  Cardiology Nephrology  Procedures:  6/9 ETT >> 6/9 R IJ HD cath >>  Significant Diagnostic Tests:  Cardiac Cath June 13, 2018:  Severe multivessel native CAD with CTO of ostial LCx that has 3 major OM branches all occluded, LAD after diffusely diseased D1, and new ostial RCA disease with moderate in-stent restenosis of the mid RCA.  Patent LIMA-LAD and SVG-OM 2 with occluded side branch of OM 2 and essentially subtotal occlusion of OM 2 that is not PCI amenable   Despite significant CAD, relatively preserved LVEF and EDP  6/9 LHC  >>  CULPRIT LESION: Ost RCA to Prox RCA lesion is 90% stenosed. Prox RCA lesion is 45% stenosed. -Moderate-severely calcified  A drug-eluting stent was successfully placed using a STENT RESOLUTE LOVF6.4P32. Postdilated in tapered fashion  from 3.7 down to 3.2 mm  Post intervention, there is a 0% residual stenosis.  ------------------------------------------------------------  Mid RCA-2 overlapping stent segment is ~ 65% stenosed.  Scoring balloon angioplasty was performed using a BALLOON WOLVERINE 3.00X6.  Post intervention, there is a 15%  residual stenosis.  ---------------------------------------------------------------------  Dist RCA lesion is 45% stenosed. Post Atrio lesion is 100% stenosed.  Micro Data:   Antimicrobials:   Interim history/subjective:   No events overnight, no new complaints  Objective   Blood pressure (!) 137/45, pulse 61, temperature (!) 94.3 F (34.6 C), temperature source Core, resp. rate (!) 24, height 5\' 5"  (1.651 m), weight 71.4 kg, SpO2 97 %. CVP:  [6 mmHg-12 mmHg] 10 mmHg  Vent Mode: PRVC FiO2 (%):  [40 %-60 %] 40 % Set Rate:  [16 bmp] 16 bmp Vt Set:  [500 mL] 500 mL PEEP:  [5 cmH20] 5 cmH20 Plateau Pressure:  [12 POE42-35 cmH20] 12 cmH20   Intake/Output Summary (Last 24 hours) at 06/19/2018 0804 Last data filed at 06/19/2018 0600 Gross per 24 hour  Intake 1368.41 ml  Output 1662 ml  Net -293.59 ml   Filed Weights   06/16/2018 0714 06/22/2018 1900 06/19/18 0257  Weight: 66.7 kg 68.3 kg 71.4 kg   Examination: General:  Chronically ill appearing male, NAD, sedated and paralyzed HEENT: [place]/AT, pupils non reactive, EOM-negative and MMM, ETT in place Neuro:  Pupils non reactive, paralyzed CV: RRR, Nl S1/S2 and -M/R/G PULM: CTA bilaterally GI: Soft, NT, ND and +BS Extremities: -edema and -tenderness Skin: no obvious rash   I reviewed CXR myself, ETT in a good position  Resolved Hospital Problem list    Assessment & Plan:   VT cardiac arrest s/p recurrent VT w/ electrolyte imbalances Progressive Angina w/ Extensive CAD s/p prior CABG with multiple PCI and PTCA interventions on the RCA for in-stent restenosis s/p 6/9 Balloon angioplasty and atherectomy based DES PCI of the ostial-proximal RCA Ischemic cardiomyopathy  P:  Tele monitoring Pressors for MAP of 65 as ordered Amio and lido per cards Follow electrolytes and replete K goal 4, mag goal 2  TTE per cards Attempt cortrack placement  Unable to get OGT in [place] since no PO access Rectal asa until enteral access  obtained   Acute encephalopathy post cardiac arrest w/ ROSC after 20 mins P:  TTM protocol in place, 33 deg Sedation in place  Rewarming  Acute respiratory insufficieny in the setting of cardiac arrest  P:  Maintain on full vent support Wean as tolerated  Maintain sats >90%  Wean FiO2 and PEEP (drop to 5) for sat of 88-92%  ESRD on iHD P:  Catheter in place  Dialysis need per nephrology, currently on CRRT running -50 to 100 per hour  DM w/hyperglycemia P:  Insulin ggt Can make plans to transition to subq when possible   Hx HTN P:  Holding imdur and metoprolol   HLD P:  Continue statin   Reported dysphagia by family P:  Unable to place OGT by MD; considerable postpharyngeal swelling   Best practice:  Diet: NPO Pain/Anxiety/Delirium protocol (if indicated): fentanyl/ versed/ nimbex VAP protocol (if indicated): yes DVT prophylaxis: heparin SQ GI prophylaxis: pepcid Glucose control: insulin gtt per protocol Mobility: BR Code Status: Full  Family Communication: per primary  Disposition:  ICU  Labs   CBC: Recent Labs  Lab 07/01/2018 0733  07/06/2018 2324 06/18/18 0112 06/18/18 0312 06/18/18 0450 06/19/18 0455  WBC 9.7  --   --   --   --  20.0* 19.5*  HGB 11.4*   < > 11.2* 11.6* 10.9* 11.3* 12.4*  HCT 35.9*   < > 33.0* 34.0* 32.0* 34.1* 37.9*  MCV 103.8*  --   --   --   --  100.3* 98.4  PLT 253  --   --   --   --  260 269   < > = values in this interval not displayed.    Basic Metabolic Panel: Recent Labs  Lab 06/28/2018 0807 06/13/2018 1811  06/18/18 0450 06/18/18 1025 06/18/18 1613 06/19/18 0022 06/19/18 0454  NA  --  132*   < > 133* 135 133* 135 136  K 3.2* 7.4*   < > 5.3* 4.8 4.7 5.5* 6.1*  CL  --  93*   < > 95* 98 98 101 102  CO2  --  20*   < > 21* 22 21* 22 22  GLUCOSE  --  211*   < > 235* 120* 139* 120* 130*  BUN  --  62*   < > 61* 61* 54* 36* 33*  CREATININE  --  7.93*   < > 7.68* 7.72* 6.11* 4.55* 4.36*  CALCIUM  --  7.8*   < > 8.6* 8.7*  8.3* 8.1* 8.4*  MG 1.0* 1.7  --  3.2*  --   --   --  2.5*  PHOS  --   --   --  2.7  --  2.9  --  2.5   < > = values in this interval not displayed.   GFR: Estimated Creatinine Clearance: 12.3 mL/min (A) (by C-G formula based on SCr of 4.36 mg/dL (H)). Recent Labs  Lab 06/28/2018 0733 06/18/18 0450 06/19/18 0455  WBC 9.7 20.0* 19.5*    Liver Function Tests: Recent Labs  Lab 06/18/18 1613 06/19/18 0454  ALBUMIN 2.8* 2.9*   No results for input(s): LIPASE, AMYLASE in the last 168 hours. No results for input(s): AMMONIA in the last 168 hours.  ABG    Component Value Date/Time   PHART 7.439 06/18/2018 0424   PCO2ART 33.5 06/18/2018 0424   PO2ART 216 (H) 06/18/2018 0424   HCO3 22.3 06/18/2018 0424   TCO2 21 (L) 06/12/2018 2324   ACIDBASEDEF 1.2 06/18/2018 0424   O2SAT 99.2 06/18/2018 0424     Coagulation Profile: Recent Labs  Lab 06/18/18 0206 06/18/18 1000  INR 1.0 1.2    Cardiac Enzymes: Recent Labs  Lab 07/07/2018 2014  TROPONINI 0.52*    HbA1C: Hgb A1c MFr Bld  Date/Time Value Ref Range Status  04/11/2018 09:30 AM 5.4 4.6 - 6.5 % Final    Comment:    Glycemic Control Guidelines for People with Diabetes:Non Diabetic:  <6%Goal of Therapy: <7%Additional Action Suggested:  >8%   10/21/2017 09:25 AM 5.5 4.6 - 6.5 % Final    Comment:    Glycemic Control Guidelines for People with Diabetes:Non Diabetic:  <6%Goal of Therapy: <7%Additional Action Suggested:  >8%     CBG: Recent Labs  Lab 06/18/18 1825 06/18/18 2021 06/19/18 0016 06/19/18 0442 06/19/18 0750  GLUCAP 147* 146* 120* 125* 115*   The patient is critically ill with multiple organ systems failure and requires high complexity decision making for assessment and support, frequent evaluation and titration of therapies, application of advanced monitoring technologies and extensive interpretation of multiple databases.   Critical Care Time devoted to patient care services described in this note is  33   Minutes. This time reflects time of care of this signee  Dr Jennet Maduro. This critical care time does not reflect procedure time, or teaching time or supervisory time of PA/NP/Med student/Med Resident etc but could involve care discussion time.  Rush Farmer, M.D. [facility] Pulmonary/Critical Care Medicine. Pager: [phone]. After hours pager: [phone].

## 2018-06-19 NOTE — Progress Notes (Signed)
[facility] Progress Note  Subjective: tolerating CCRT, I/O even yest, wt's up 2kg. K 6.1, other labs ok. On levo gtt at 7 ug/min  Vitals:   06/19/18 0900 06/19/18 1000 06/19/18 1100 06/19/18 1117  BP: (!) 96/27 (!) 101/30 (!) 92/25 (!) 120/40  Pulse:    63  Resp: 10 17 (!) 26 16  Temp: (!) 94.8 F (34.9 C) (!) 95.2 F (35.1 C) (!) 95.9 F (35.5 C)   TempSrc: Core Core Core   SpO2:    97%  Weight:      Height:        Inpatient medications: . artificial tears  1 application Both Eyes X4V  . aspirin  150 mg Rectal Daily  . atorvastatin  40 mg Oral QPM  . chlorhexidine gluconate (MEDLINE KIT)  15 mL Mouth Rinse BID  . Chlorhexidine Gluconate Cloth  6 each Topical Daily  . Chlorhexidine Gluconate Cloth  6 each Topical Q0600  . cinacalcet  30 mg Oral Q supper  . doxercalciferol  1 mcg Intravenous Q T,Th,Sa-HD  . fentaNYL (SUBLIMAZE) injection  50 mcg Intravenous Once  . ferric citrate  420 mg Oral TID WC  . heparin  5,000 Units Subcutaneous Q8H  . insulin aspart  1-3 Units Subcutaneous Q4H  . insulin detemir  5 Units Subcutaneous Q12H  . levothyroxine  62.5 mcg Intravenous Daily  . mouth rinse  15 mL Mouth Rinse 10 times per day  . midazolam  1 mg Intravenous Once  . nystatin  5 mL Oral QID  . sodium chloride flush  10-40 mL Intracatheter Q12H  . sodium chloride flush  3 mL Intravenous Q12H   . sodium chloride    . sodium chloride Stopped (06/18/18 1219)  . amiodarone 15 mg/hr (06/19/18 1100)  . cangrelor 50 mg in NS 250 mL 0.75 mcg/kg/min (06/19/18 1100)  . cisatracurium (NIMBEX) infusion 1.5 mcg/kg/min (06/19/18 1100)  . famotidine (PEPCID) IV Stopped (06/18/18 2221)  . fentaNYL infusion INTRAVENOUS 200 mcg/hr (06/19/18 1113)  . midazolam 4 mg/hr (06/19/18 1100)  . norepinephrine (LEVOPHED) Adult infusion 7 mcg/min (06/19/18 1100)  . prismasol BGK 2/2.5 dialysis solution 2,000 mL/hr at 06/19/18 0919  . prismasol BGK 2/2.5 replacement solution 500 mL/hr  at 06/19/18 0904  . prismasol BGK 2/2.5 replacement solution 300 mL/hr at 06/19/18 0555   sodium chloride, fentaNYL, fentaNYL (SUBLIMAZE) injection, fentaNYL (SUBLIMAZE) injection, ferric citrate **AND** ferric citrate, heparin, heparin, iohexol, lidocaine-prilocaine, Melatonin, midazolam, sodium chloride flush, sodium chloride flush    Exam: General appearance: Intubated and unresponsive Head: Normocephalic, without obvious abnormality, atraumatic Resp: rhonchi bilaterally Cardio: tachy no rub GI: soft, non-tender; bowel sounds normal; no masses,  no organomegaly Extremities: cool to touch, no edema, LUE AVF +T/B Dialysis Access: LUE AVF +T/B  CXR 6/10 - no sig edema/ infiltrates  Dialysis: [facility]  TTS  4h  2/2.25 bath  63.5kg  Hep 3000  LUE AVF  350/800  P4 Hectoral 1 mcg IV/HD Micera 30 mcg IV every 6 weeks (last given 05/13/18)  Assessment/Plan: 1.  VT arrest- per cards, on IV amio now, pt cooled now is coming out, paralyzed/ sedated on vent 2. Multivessel CAD- s/p intervention of 2 lesions in RCA and DES placed in prox RCA 3. ESRD - usual HD TTS, now on CRRT d#2.  No issues, on pressors still.  4.  Hypertension/volume  - hypotensive on pressors, 7-8kg up 5.  Anemia  - Hb 12, no need for esa 6.  Metabolic bone disease -  stable 7.  Nutrition - npo for now.    [facility] 06/19/2018, 11:45 AM  Iron/TIBC/Ferritin/ %Sat    Component Value Date/Time   IRON 15 (L) 10/13/2010 1500   TIBC 213 (L) 10/13/2010 1500   FERRITIN 116 10/13/2010 1500   IRONPCTSAT 7 (L) 10/13/2010 1500   Recent Labs  Lab 06/18/18 1000  06/19/18 0454  NA  --    < > 136  K  --    < > 6.1*  CL  --    < > 102  CO2  --    < > 22  GLUCOSE  --    < > 130*  BUN  --    < > 33*  CREATININE  --    < > 4.36*  CALCIUM  --    < > 8.4*  PHOS  --    < > 2.5  ALBUMIN  --    < > 2.9*  INR 1.2  --   --    < > = values in this interval not displayed.   No results for input(s): AST,  ALT, ALKPHOS, BILITOT, PROT in the last 168 hours. Recent Labs  Lab 06/19/18 0455  WBC 19.5*  HGB 12.4*  HCT 37.9*  PLT 269

## 2018-06-19 NOTE — Progress Notes (Addendum)
Progress Note  Patient Name: [name] [name] Date of Encounter: 06/19/2018  Primary Cardiologist: Dr. Ellyn Hack  Subjective   Remains Intubated, paralyzed and sedated on hypothermia protocol now being rewarmed since 1am. Now on CVVHD.  Inpatient Medications    Scheduled Meds:  artificial tears  1 application Both Eyes E0E   aspirin  81 mg Oral Once   atorvastatin  40 mg Oral QPM   chlorhexidine gluconate (MEDLINE KIT)  15 mL Mouth Rinse BID   Chlorhexidine Gluconate Cloth  6 each Topical Daily   Chlorhexidine Gluconate Cloth  6 each Topical Q0600   cinacalcet  30 mg Oral Q supper   doxercalciferol  1 mcg Intravenous Q T,Th,Sa-HD   fentaNYL (SUBLIMAZE) injection  50 mcg Intravenous Once   ferric citrate  420 mg Oral TID WC   heparin  5,000 Units Subcutaneous Q8H   insulin aspart  1-3 Units Subcutaneous Q4H   insulin detemir  5 Units Subcutaneous Q12H   levothyroxine  62.5 mcg Intravenous Daily   mouth rinse  15 mL Mouth Rinse 10 times per day   midazolam  1 mg Intravenous Once   nystatin  5 mL Oral QID   sodium chloride flush  10-40 mL Intracatheter Q12H   sodium chloride flush  3 mL Intravenous Q12H   Continuous Infusions:  sodium chloride     sodium chloride Stopped (06/18/18 1219)   amiodarone 15 mg/hr (06/19/18 0803)   cangrelor 50 mg in NS 250 mL 0.75 mcg/kg/min (06/19/18 0800)   cisatracurium (NIMBEX) infusion 1.5 mcg/kg/min (06/19/18 0806)   famotidine (PEPCID) IV Stopped (06/18/18 2221)   fentaNYL infusion INTRAVENOUS 150 mcg/hr (06/19/18 0800)   midazolam 4 mg/hr (06/19/18 0800)   norepinephrine (LEVOPHED) Adult infusion 7 mcg/min (06/19/18 0805)   prismasol BGK 2/2.5 dialysis solution     prismasol BGK 2/2.5 replacement solution 500 mL/hr at 06/18/18 2231   prismasol BGK 2/2.5 replacement solution 300 mL/hr at 06/19/18 0555   PRN Meds: sodium chloride, fentaNYL, fentaNYL (SUBLIMAZE) injection, fentaNYL (SUBLIMAZE) injection,  ferric citrate **AND** ferric citrate, heparin, heparin, iohexol, lidocaine-prilocaine, Melatonin, midazolam, sodium chloride flush, sodium chloride flush   Vital Signs    Vitals:   06/19/18 0600 06/19/18 0736 06/19/18 0748 06/19/18 0800  BP: (!) 110/35 (!) 137/45  (!) 91/32  Pulse: (!) 54 61    Resp: 16 (!) 24  (!) 0  Temp: (!) 93.4 F (34.1 C)  (!) 94.3 F (34.6 C) (!) 94.3 F (34.6 C)  TempSrc:   Core Core  SpO2: 100% 97%    Weight:      Height:        Intake/Output Summary (Last 24 hours) at 06/19/2018 0839 Last data filed at 06/19/2018 0800 Gross per 24 hour  Intake 1518.58 ml  Output 1791 ml  Net -272.42 ml    I/O since admission: +1166  Filed Weights   06/16/2018 0714 06/11/2018 1900 06/19/18 0257  Weight: 66.7 kg 68.3 kg 71.4 kg    Telemetry    SB in 50 - 60s- Personally Reviewed  ECG    ECG (independently read by me): Marked sinus bradycardia at 39; LBBB  Physical Exam   BP (!) 91/32    Pulse 61    Temp (!) 94.3 F (34.6 C) (Core)    Resp (!) 0    Ht '5\' 5"'$  (1.651 m)    Wt 71.4 kg    SpO2 97%    BMI 26.19 kg/m  General: Paralyzed, intubated,sedated on vent  Skin: normal turgor, no rashes, warm and dry HEENT: Normocephalic, atraumatic. Pupils equal round and reactive to light; sclera anicteric; extraocular muscles intact; Nose without nasal septal hypertrophy Mouth/Parynx intubated Neck: No JVD, no carotid bruits; normal carotid upstroke Lungs: clear to ausculatation and percussion; no wheezing or rales Chest wall: without tenderness to palpitation Heart: PMI not displaced, RRR, s1 s2 normal, 1/6 systolic murmur, no diastolic murmur, no rubs, gallops, thrills, or heaves Abdomen: soft, nontender; no hepatosplenomehaly, BS+; abdominal aorta nontender and not dilated by palpation. Back: no CVA tenderness Pulses 2+ Musculoskeletal: , no joint deformities Extremities: no clubbing cyanosis or edema, Homan's sign negative  Neurologic: grossly nonfocal; Cranial  nerves grossly wnl      Labs    Chemistry Recent Labs  Lab 06/18/18 1613 06/19/18 0022 06/19/18 0454  NA 133* 135 136  K 4.7 5.5* 6.1*  CL 98 101 102  CO2 21* 22 22  GLUCOSE 139* 120* 130*  BUN 54* 36* 33*  CREATININE 6.11* 4.55* 4.36*  CALCIUM 8.3* 8.1* 8.4*  ALBUMIN 2.8*  --  2.9*  GFRNONAA 8* 12* 12*  GFRAA 9* 13* 14*  ANIONGAP '14 12 12     '$ Hematology Recent Labs  Lab 06/29/2018 0733  06/18/18 0312 06/18/18 0450 06/19/18 0455  WBC 9.7  --   --  20.0* 19.5*  RBC 3.46*  --   --  3.40* 3.85*  HGB 11.4*   < > 10.9* 11.3* 12.4*  HCT 35.9*   < > 32.0* 34.1* 37.9*  MCV 103.8*  --   --  100.3* 98.4  MCH 32.9  --   --  33.2 32.2  MCHC 31.8  --   --  33.1 32.7  RDW 13.9  --   --  13.8 14.3  PLT 253  --   --  260 269   < > = values in this interval not displayed.    Cardiac Enzymes Recent Labs  Lab 07/03/2018 2014  TROPONINI 0.52*   No results for input(s): TROPIPOC in the last 168 hours.   BNPNo results for input(s): BNP, PROBNP in the last 168 hours.   DDimer No results for input(s): DDIMER in the last 168 hours.   Lipid Panel     Component Value Date/Time   CHOL 145 04/11/2018 0930   CHOL 162 06/28/2016 1427   TRIG 184.0 (H) 04/11/2018 0930   HDL 24.10 (L) 04/11/2018 0930   HDL 36 (L) 06/28/2016 1427   CHOLHDL 6 04/11/2018 0930   VLDL 36.8 04/11/2018 0930   LDLCALC 84 04/11/2018 0930   LDLCALC 97 06/28/2016 1427   LDLDIRECT 154.0 04/22/2017 0954     Radiology    Dg Chest Port 1 View  Result Date: 06/18/2018 CLINICAL DATA:  Central line placement EXAM: PORTABLE CHEST 1 VIEW COMPARISON:  06/16/2018. FINDINGS: The right-sided central venous catheter tip projects over the expected region of the proximal SVC. There is a new left subclavian central venous catheter with tip projecting over the distal SVC. The endotracheal tube terminates above the carina. The patient is status post prior median sternotomy. The heart size is somewhat enlarged. There is volume  overload. Multiple vascular stents are noted in the left axilla. There is no evidence of a pneumo thorax. The right costophrenic angle is not well visualized. The retrocardiac region is poorly evaluated secondary to an overlying defibrillator pad. IMPRESSION: 1. Newly placed left subclavian central venous catheter with tip projecting over the distal SVC. Stable appearance of the remaining lines  and tubes. 2. No evidence of a pneumothorax, however the right costophrenic angle is not fully visualized on this exam. 3. Otherwise, stable appearance of the chest when compared to recent prior study. Electronically Signed   By: Constance Holster M.D.   On: 06/18/2018 02:01   Dg Chest Port 1 View  Result Date: 07/01/2018 CLINICAL DATA:  78 year old male central line placement. EXAM: PORTABLE CHEST 1 VIEW COMPARISON:  Portable chest earlier today. FINDINGS: Portable AP semi upright view at 2138 hours. Right IJ approach dual lumen appearing catheter with tip at the superior mediastinum, innominate vein confluence level. No pneumothorax. Stable cardiac size and mediastinal contours. Stable ET tube. Prior CABG. Improved lung volumes and bilateral perihilar ventilation with residual interstitial opacity greater on the left. No pleural effusion. Negative visible bowel gas pattern. Left axillary/subclavian vascular stents. IMPRESSION: 1. Right IJ approach dual lumen catheter placed with tip at innominate vein confluence level. 2. Stable ET tube. 3. No pneumothorax. Improved lung volumes and bilateral perihilar ventilation since 2000 hours. Electronically Signed   By: Genevie Ann M.D.   On: 06/29/2018 22:00   Dg Chest Port 1 View  Result Date: 06/14/2018 CLINICAL DATA:  Cardiopulmonary arrest.  Intubated. EXAM: PORTABLE CHEST 1 VIEW COMPARISON:  05/18/2016 FINDINGS: Previous median sternotomy and CABG. Endotracheal tube tip is 3 cm above the carina. There is an interstitial pulmonary edema pattern. No dense consolidation,  collapse or effusion. Left subclavian and axillary vascular stents. IMPRESSION: Endotracheal tube well positioned.  Interstitial pulmonary edema. Electronically Signed   By: Nelson Chimes M.D.   On: 07/03/2018 20:20   Dg C-arm 1-60 Min  Result Date: 06/18/2018 INDICATION: Malnutition, encounter for feeding tube placement EXAM: DG C-ARM 61-120 MIN COMPARISON:  None. CONTRAST:  None FLUOROSCOPY TIME:  10 minutes. COMPLICATIONS: None immediate PROCEDURE: The feeding tube was lubricated with viscous lidocaine inserted into the mouth. Under intermittent fluoroscopic guidance, the feeding tube was advanced through the oropharynx and into the proximal esophagus. Despite multiple unsuccessful attempts, the feeding tube was unable to be passed into the stomach. A spot fluoroscopic image was saved for documentation purposes. The patient tolerated the procedure well without immediate postprocedural complication. FINDINGS: The feeding tube was advanced to the level of the carina. Multiple attempts to pass it beyond this level were unsuccessful. IMPRESSION: Unsuccessful fluoroscopic guided placement of feeding tube using intermittent fluoroscopic guidance. Electronically Signed   By: Kerby Moors M.D.   On: 06/18/2018 17:33    Cardiac Studies    CULPRIT LESION: Ost RCA to Prox RCA lesion is 90% stenosed. Prox RCA lesion is 45% stenosed. -Moderate-severely calcified  A drug-eluting stent was successfully placed using a STENT RESOLUTE KGUR4.2H06. Postdilated in tapered fashion from 3.7 down to 3.2 mm  Post intervention, there is a 0% residual stenosis.  ------------------------------------------------------------  Mid RCA-2 overlapping stent segment is ~ 65% stenosed.  Scoring balloon angioplasty was performed using a BALLOON WOLVERINE 3.00X6.  Post intervention, there is a 15% residual stenosis.  ---------------------------------------------------------------------  Dist RCA lesion is 45% stenosed. Post  Atrio lesion is 100% stenosed.   SUMMARY  Successful orbital atherectomy based DES PCI of ostial-proximal RCA (Resolute Onyx DES 3.0 mm x 38 mm --> 3.7-3.2 mm taper post dilation)  Successful Scoring Balloon angioplasty and post dilation (with 3.5 mm [place] balloon) of mid RCA ISR  Intermittent pacing with Temporary Pacemaker-removed post PCI  Successful defibrillation of Ventricular Tachycardia-1 shock 200 J   RECOMMENDATIONS  Overnight observation post PCI with sheath removal  in PACU holding area  Continue home medications as prescribed  He will need dialysis today and anticipate discharge tomorrow  He already has scheduled follow-up    Intervention      Patient Profile     78 y.o. male with ESRD on dialysis, MVCAD, s/p CABG, who presented to hospital on to undergo atherectomy of severely calcifed RCA.  Assessment & Plan    1. CAD: severe MVCAD as noted above ; s/p elective CSI atherectomy to RCA yesterday. Pt had been on chronic plavix PTA. Now on cagrelor since no oral tube or Cortrak;  Will start ASA PR.    2. Cardiac Arrest: Developed recurrent episodes of NSVT and required defibrillation in cath lab for VT and last evening developed VT cardiac arrest in dialysis lab in setting of K 7.7 and hypoMg.  Sucessufully resuscitated;  on amiodarone, lidocaine, levophed, hypothermia, intubated, sedated and paralyzed on versed, fentanyl and nimbez.  Suspect electrolyte  issues in arrest  Excellent result at PCI.   3. LBBB;  ECG earlier today with marked SB at 39; HR in the 40s 6/10 lidocaine dc'd,  Amiodarone decreased  from 30 mg/h to 15 mg/h. On hypothermic protocol with T 33C also contributing to bradycardia yesterday.  HR  Now in 50 - 60s.  Continue amiodarone today iv  ? Transition or oral once able to take po.   4. Hypotension: continues on levophed, BP in 90s on 7 ug/min  5. Antiplatelet RX; Pt was on plavix pre-procedure. Now intubated and unable to pass NG or feeding  tube. [facility] team unavaiable until Friday to attempt placement, on cangrelor infusion until oral meds can be taken.  6. Mechanical ventilation: followed by CCM.  7 ESRD:  Now  CVVHD per nephrology; K this am 6.1 ; pre, post and diasylate have been changed per renal   CC: 35 minutes  Signed, Troy Sine, MD, [facility] 06/19/2018, 8:39 AM

## 2018-06-19 NOTE — Progress Notes (Signed)
Updated wife and informed her that she can video into see her husband. Gave number to [email or web address].

## 2018-06-20 ENCOUNTER — Inpatient Hospital Stay (HOSPITAL_COMMUNITY): Payer: Medicare Other

## 2018-06-20 LAB — CBC
HCT: 37 % — ABNORMAL LOW (ref 39.0–52.0)
Hemoglobin: 11.8 g/dL — ABNORMAL LOW (ref 13.0–17.0)
MCH: 32.7 pg (ref 26.0–34.0)
MCHC: 31.9 g/dL (ref 30.0–36.0)
MCV: 102.5 fL — ABNORMAL HIGH (ref 80.0–100.0)
Platelets: 212 10*3/uL (ref 150–400)
RBC: 3.61 MIL/uL — ABNORMAL LOW (ref 4.22–5.81)
RDW: 14.6 % (ref 11.5–15.5)
WBC: 17.7 10*3/uL — ABNORMAL HIGH (ref 4.0–10.5)
nRBC: 0 % (ref 0.0–0.2)

## 2018-06-20 LAB — RENAL FUNCTION PANEL
Albumin: 1.5 g/dL — ABNORMAL LOW (ref 3.5–5.0)
Albumin: 2.5 g/dL — ABNORMAL LOW (ref 3.5–5.0)
Albumin: 2.5 g/dL — ABNORMAL LOW (ref 3.5–5.0)
Anion gap: 5 (ref 5–15)
Anion gap: 12 (ref 5–15)
Anion gap: 11 (ref 5–15)
BUN: 11 mg/dL (ref 8–23)
BUN: 14 mg/dL (ref 8–23)
BUN: 12 mg/dL (ref 8–23)
CO2: 16 mmol/L — ABNORMAL LOW (ref 22–32)
CO2: 21 mmol/L — ABNORMAL LOW (ref 22–32)
CO2: 23 mmol/L (ref 22–32)
Calcium: 5.2 mg/dL — CL (ref 8.9–10.3)
Calcium: 8.5 mg/dL — ABNORMAL LOW (ref 8.9–10.3)
Calcium: 8.9 mg/dL (ref 8.9–10.3)
Chloride: 119 mmol/L — ABNORMAL HIGH (ref 98–111)
Chloride: 100 mmol/L (ref 98–111)
Chloride: 102 mmol/L (ref 98–111)
Creatinine, Ser: 1.52 mg/dL — ABNORMAL HIGH (ref 0.61–1.24)
Creatinine, Ser: 2.59 mg/dL — ABNORMAL HIGH (ref 0.61–1.24)
Creatinine, Ser: 2.18 mg/dL — ABNORMAL HIGH (ref 0.61–1.24)
GFR calc Af Amer: 50 mL/min — ABNORMAL LOW (ref >60)
GFR calc Af Amer: 27 mL/min — ABNORMAL LOW (ref >60)
GFR calc Af Amer: 33 mL/min — ABNORMAL LOW (ref >60)
GFR calc non Af Amer: 44 mL/min — ABNORMAL LOW (ref >60)
GFR calc non Af Amer: 23 mL/min — ABNORMAL LOW (ref >60)
GFR calc non Af Amer: 28 mL/min — ABNORMAL LOW (ref >60)
Glucose, Bld: 99 mg/dL (ref 70–99)
Glucose, Bld: 226 mg/dL — ABNORMAL HIGH (ref 70–99)
Glucose, Bld: 129 mg/dL — ABNORMAL HIGH (ref 70–99)
Phosphorus: 2.3 mg/dL — ABNORMAL LOW (ref 2.5–4.6)
Phosphorus: 3.9 mg/dL (ref 2.5–4.6)
Phosphorus: 3.4 mg/dL (ref 2.5–4.6)
Potassium: 2.8 mmol/L — ABNORMAL LOW (ref 3.5–5.1)
Potassium: 4.5 mmol/L (ref 3.5–5.1)
Potassium: 3.9 mmol/L (ref 3.5–5.1)
Sodium: 140 mmol/L (ref 135–145)
Sodium: 133 mmol/L — ABNORMAL LOW (ref 135–145)
Sodium: 136 mmol/L (ref 135–145)

## 2018-06-20 LAB — POCT I-STAT 7, (LYTES, BLD GAS, ICA,H+H)
Acid-base deficit: 1 mmol/L (ref 0.0–2.0)
Bicarbonate: 24.3 mmol/L (ref 20.0–28.0)
Calcium, Ion: 1.17 mmol/L (ref 1.15–1.40)
HCT: 35 % — ABNORMAL LOW (ref 39.0–52.0)
Hemoglobin: 11.9 g/dL — ABNORMAL LOW (ref 13.0–17.0)
O2 Saturation: 98 %
Patient temperature: 37.1
Potassium: 4.7 mmol/L (ref 3.5–5.1)
Sodium: 134 mmol/L — ABNORMAL LOW (ref 135–145)
TCO2: 25 mmol/L (ref 22–32)
pCO2 arterial: 39.9 mmHg (ref 32.0–48.0)
pH, Arterial: 7.393 (ref 7.350–7.450)
pO2, Arterial: 100 mmHg (ref 83.0–108.0)

## 2018-06-20 LAB — GLUCOSE, CAPILLARY
Glucose-Capillary: 258 mg/dL — ABNORMAL HIGH (ref 70–99)
Glucose-Capillary: 63 mg/dL — ABNORMAL LOW (ref 70–99)
Glucose-Capillary: 229 mg/dL — ABNORMAL HIGH (ref 70–99)
Glucose-Capillary: 152 mg/dL — ABNORMAL HIGH (ref 70–99)
Glucose-Capillary: 103 mg/dL — ABNORMAL HIGH (ref 70–99)
Glucose-Capillary: 117 mg/dL — ABNORMAL HIGH (ref 70–99)

## 2018-06-20 LAB — MAGNESIUM: Magnesium: 2.4 mg/dL (ref 1.7–2.4)

## 2018-06-20 MED ORDER — DEXTROSE 50 % IV SOLN
INTRAVENOUS | Status: AC
  Filled 2018-06-20: qty 50

## 2018-06-20 MED ORDER — CHLORHEXIDINE GLUCONATE CLOTH 2 % EX PADS
6.0000 | MEDICATED_PAD | Freq: Every day | CUTANEOUS | Status: DC
  Administered 2018-06-20 – 2018-06-22 (×3): 6 via TOPICAL

## 2018-06-20 MED ORDER — DEXTROSE 50 % IV SOLN
25.0000 mL | Freq: Once | INTRAVENOUS | Status: AC
  Administered 2018-06-20: 25 mL via INTRAVENOUS

## 2018-06-20 MED ORDER — VITAL 1.5 CAL PO LIQD
1000.0000 mL | ORAL | Status: DC
  Administered 2018-06-20 – 2018-06-23 (×4): 1000 mL
  Filled 2018-06-20 (×6): qty 1000

## 2018-06-20 MED ORDER — B COMPLEX-C PO TABS
1.0000 | ORAL_TABLET | Freq: Every day | ORAL | Status: DC
  Administered 2018-06-20 – 2018-06-23 (×4): 1
  Filled 2018-06-20 (×5): qty 1

## 2018-06-20 MED ORDER — VITAL AF 1.2 CAL PO LIQD: 1000.0000 mL | ORAL | Status: DC

## 2018-06-20 MED ORDER — PRO-STAT SUGAR FREE PO LIQD
30.0000 mL | Freq: Three times a day (TID) | ORAL | Status: DC
  Administered 2018-06-20 – 2018-06-23 (×9): 30 mL
  Filled 2018-06-20 (×10): qty 30

## 2018-06-20 NOTE — Progress Notes (Signed)
[facility] Progress Note  Subjective: I/O - 1.2 L net yesterday, pulling around 65 cc/hr now, on levo gtt only at 11 ug /min  Vitals:   06/20/18 0727 06/20/18 0800 06/20/18 0900 06/20/18 1000  BP: (!) 142/44 (!) 111/27 (!) 102/51 (!) 107/32  Pulse: 82  91   Resp: _0 (!) 8  Temp:  98.8 F (37.1 C) 98.6 F (37 C) 98.6 F (37 C)  TempSrc:  Core Core Core  SpO2: 98%  (!) 88%   Weight:      Height:        Inpatient medications: . aspirin  150 mg Rectal Daily  . atorvastatin  40 mg Oral QPM  . chlorhexidine gluconate (MEDLINE KIT)  15 mL Mouth Rinse BID  . Chlorhexidine Gluconate Cloth  6 each Topical Daily  . Chlorhexidine Gluconate Cloth  6 each Topical Q0600  . Chlorhexidine Gluconate Cloth  6 each Topical Daily  . cinacalcet  30 mg Oral Q supper  . dextrose      . doxercalciferol  1 mcg Intravenous Q T,Th,Sa-HD  . fentaNYL (SUBLIMAZE) injection  50 mcg Intravenous Once  . ferric citrate  420 mg Oral TID WC  . heparin  5,000 Units Subcutaneous Q8H  . insulin aspart  1-3 Units Subcutaneous Q4H  . insulin detemir  5 Units Subcutaneous Q12H  . levothyroxine  62.5 mcg Intravenous Daily  . mouth rinse  15 mL Mouth Rinse 10 times per day  . midazolam  1 mg Intravenous Once  . nystatin  5 mL Oral QID   . sodium chloride Stopped (06/18/18 1219)  . amiodarone 15 mg/hr (06/20/18 1000)  . cangrelor 50 mg in NS 250 mL 0.75 mcg/kg/min (06/20/18 1000)  . famotidine (PEPCID) IV Stopped (06/19/18 2147)  . fentaNYL infusion INTRAVENOUS Stopped (06/20/18 0740)  . midazolam Stopped (06/20/18 0740)  . norepinephrine (LEVOPHED) Adult infusion 8 mcg/min (06/20/18 1000)  . prismasol BGK 2/2.5 dialysis solution 2,000 mL/hr at 06/20/18 0859  . prismasol BGK 2/2.5 replacement solution 500 mL/hr at 06/19/18 0904  . prismasol BGK 2/2.5 replacement solution 300 mL/hr at 06/19/18 0555   fentaNYL, fentaNYL (SUBLIMAZE) injection, fentaNYL (SUBLIMAZE) injection, ferric citrate  **AND** ferric citrate, heparin, heparin, iohexol, lidocaine-prilocaine, Melatonin, midazolam, sodium chloride flush    Exam: General appearance: Intubated , opens eyes to voice Resp: rhonchi bilaterally Cardio: tachy no rub GI: soft, non-tender; bowel sounds normal; no masses,  no organomegaly Extremities: cool to touch, no edema, LUE AVF +T/B Dialysis Access: LUE AVF +T/B  CXR 6/10 - no sig edema/ infiltrates  Dialysis: [facility]  TTS  4h  2/2.25 bath  63.5kg  Hep 3000  LUE AVF  350/800  P4 Hectoral 1 mcg IV/HD Micera 30 mcg IV every 6 weeks (last given 05/13/18)  Assessment/Plan: 1.  VT arrest- per cards, on IV amio 2. Resp failure - acute on vent, lifting sedation and plan to wean +/- extubate today 3. Multivessel CAD- s/p intervention of 2 lesions in RCA and DES placed in prox RCA 4. ESRD - on CRRT d#3.  Cont to pull 50 - 75 cc/hr.  Only up 4kg today. Exam not remarkable for sig fluid excess 5.  Hypotension: remains on levo gtt 6.  Anemia  - Hb 12, no need for esa 7.  Metabolic bone disease -  stable 8.  Nutrition - npo for now.    [facility] 06/20/2018, 10:53 AM  Iron/TIBC/Ferritin/ %Sat    Component Value  Date/Time   IRON 15 (L) 10/13/2010 1500   TIBC 213 (L) 10/13/2010 1500   FERRITIN 116 10/13/2010 1500   IRONPCTSAT 7 (L) 10/13/2010 1500   Recent Labs  Lab 06/18/18 1000  06/20/18 0505  NA  --    < > 133*  K  --    < > 4.5  CL  --    < > 100  CO2  --    < > 21*  GLUCOSE  --    < > 226*  BUN  --    < > 14  CREATININE  --    < > 2.59*  CALCIUM  --    < > 8.5*  PHOS  --    < > 3.9  ALBUMIN  --    < > 2.5*  INR 1.2  --   --    < > = values in this interval not displayed.   No results for input(s): AST, ALT, ALKPHOS, BILITOT, PROT in the last 168 hours. Recent Labs  Lab 06/20/18 0505  WBC 17.7*  HGB 11.8*  HCT 37.0*  PLT 212

## 2018-06-20 NOTE — Progress Notes (Signed)
Inpatient Diabetes Program Recommendations  AACE/ADA: New Consensus Statement on Inpatient Glycemic Control (2015)  Target Ranges:  Prepandial:   less than 140 mg/dL      Peak postprandial:   less than 180 mg/dL (1-2 hours)      Critically ill patients:  140 - 180 mg/dL   Lab Results  Component Value Date   GLUCAP 103 (H) 06/20/2018   HGBA1C 5.4 04/11/2018    Review of Glycemic Control Results for [name] [name], [name] [name] (MRN [id number]) as of 06/20/2018 13:25  Ref. Range 06/20/2018 04:02 06/20/2018 04:27 06/20/2018 04:35 06/20/2018 07:27 06/20/2018 11:17  Glucose-Capillary Latest Ref Range: 70 - 99 mg/dL 63 (L) 258 (H) 229 (H) 152 (H) 103 (H)   Inpatient Diabetes Program Recommendations:    May consider d/c of Levemir.   Thanks  Adah Perl, RN, BC-ADM Inpatient Diabetes Coordinator Pager [phone] (8a-5p)

## 2018-06-20 NOTE — Progress Notes (Signed)
Updated wife and arranged a time for her to have an [place] visit this afternoon.

## 2018-06-20 NOTE — Progress Notes (Signed)
[place] for Cangrelor  Indication: DES s/p PCI   Allergies  Allergen Reactions  . Brilinta [Ticagrelor] Shortness Of Breath  . Shellfish Allergy Other (See Comments)    Causes gout flare-ups    Patient Measurements: Height: _0  (165.1 cm) Weight: 148 lb 13.3 oz (67.5 kg) IBW/kg (Calculated) : 61.5  Vital Signs: Temp: 98.6 F (37 C) (06/12 0900) Temp Source: Core (06/12 0900) BP: 102/51 (06/12 0900) Pulse Rate: 91 (06/12 0900)  Labs: Recent Labs    06/23/2018 2014  06/18/18 0206  06/18/18 0450 06/18/18 1000  06/19/18 0455 06/19/18 1547 06/20/18 0351 06/20/18 0414 06/20/18 0428 06/20/18 0505  HGB  --    < >  --    < > 11.3*  --   --  12.4*  --   --  13.3 11.9* 11.8*  HCT  --    < >  --    < > 34.1*  --   --  37.9*  --   --  39.0 35.0* 37.0*  PLT  --   --   --   --  260  --   --  269  --   --   --   --  212  APTT  --   --   --   --   --  33  --   --   --   --   --   --   --   LABPROT  --   --  13.2  --   --  14.6  --   --   --   --   --   --   --   INR  --   --  1.0  --   --  1.2  --   --   --   --   --   --   --   CREATININE 6.81*   < >  --   --  7.68*  --    < >  --  3.44* 1.52*  --   --  2.59*  TROPONINI 0.52*  --   --   --   --   --   --   --   --   --   --   --   --    < > = values in this interval not displayed.    Estimated Creatinine Clearance: 20.8 mL/min (A) (by C-G formula based on SCr of 2.59 mg/dL (H)).   Medical History: Past Medical History:  Diagnosis Date  . Anemia    Likely secondary to history of GI bleed  . Anxiety   . Arthritis   . BPH (benign prostatic hyperplasia)   . CAD (coronary artery disease) of bypass graft 2006, 2012   In 2006: Occluded SVG-OM noted (SVG-diagonal was previously occluded); 2012: Severe lesion in redo SVG-OM1 --> BMS PCI   . CAD in native artery; and the grafts 1992, 1997, 2002, 2006, 2012   CABG 1992 and redo CABG 2006  . CAD S/P percutaneous coronary angioplasty October  2012; Feb, Apr & Oct 2015; Feb 2016   a) s/p CABG 1992 - redo 2006. b) 10/12: NSTEMI: PCI-SVG-OM1 Integrity BMS 3.5 x 15 (3.85 mm) c) 2/15: UA - PCI mRCA Xience Ap DES 2.75 x 15 (3.1 mm). d) 4/15 : UA - focal dRCA Resolute DES 2.25 x 8 (2.5 mm). e) 10/15: PCI- mRCA ISR w/ post stent - Promus P 2.75 x  16 (3.25 mm). f) 2/16: NSTEMI: CBA/PTCA mRCA ISR (3.5 mm); g) 7/16 ISR RCA->CBA/PTCA, -- Most recent event: 05/11/2: CBA of ISR.  Marland Kitchen Carotid arterial disease ([place])    a) Duplex 05/2012: R BULB/PROX ICA: 50-69%, L BULB/PROX ICA: 0-49%. ;; 04/29/2014: 82-99% RICA, 37-16% LICA. Patetn vertebrals,  . Chronic low back pain   . Chronic stable angina (HCC)    Chronic stable  . Colon cancer [facility]) 2003   colectomy for CA. no recurrence . never required  radiation or chem  . 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 Dyspnea    "when I get too much Fluid"  . End stage renal disease on dialysis [facility])    Dialyzes at [facility]: Tues/Th/Sat - left upper cavity AV fistula brachiocephalic  . Gout   . History of hiatal hernia   . History of Non-ST elevated myocardial infarction (non-STEMI) 1992, 2006, 2012; 02/2013  . Hyperlipidemia   . Hypertension   . Hypothyroidism    On supplementation  . Ischemic cardiomyopathy    a) EF 45-50% by echo 2015. b) EF 50% by cath 04/2014.  Marland Kitchen LBBB (left bundle branch block)    Chronic  . Peptic ulcer disease  2004  . Plavix resistance    a) P2Y12 264 in 02/2014 - put on Brilinta but did not tolerate due to SOB. b) cannot be on Effient due to history of stroke. c) Plavix increased to 69m BID and Pletal added 04/2014 due to ISR.  .Marland KitchenPneumonia   . Skin cancer 02/1999   nose  . Stroke ([facility] 1998    Medications:  Scheduled:  . aspirin  150 mg Rectal Daily  . atorvastatin  40 mg Oral QPM  . chlorhexidine gluconate (MEDLINE KIT)  15 mL Mouth Rinse BID  . Chlorhexidine Gluconate Cloth  6 each Topical  Daily  . Chlorhexidine Gluconate Cloth  6 each Topical Q0600  . Chlorhexidine Gluconate Cloth  6 each Topical Daily  . cinacalcet  30 mg Oral Q supper  . dextrose      . doxercalciferol  1 mcg Intravenous Q T,Th,Sa-HD  . fentaNYL (SUBLIMAZE) injection  50 mcg Intravenous Once  . ferric citrate  420 mg Oral TID WC  . heparin  5,000 Units Subcutaneous Q8H  . insulin aspart  1-3 Units Subcutaneous Q4H  . insulin detemir  5 Units Subcutaneous Q12H  . levothyroxine  62.5 mcg Intravenous Daily  . mouth rinse  15 mL Mouth Rinse 10 times per day  . midazolam  1 mg Intravenous Once  . nystatin  5 mL Oral QID    Assessment: [name] Bohlkenis a 78year old male s/p antherectomy based DES PCI of ostial-proximal RCA on 6/9. PMH significant for extensive CAD, s/p CABG with multiple PCI and PTCA interventions on RCA for in-stent resetnosis, ischemic cardimyoptahy, ESRD on HD TTS. Pharmacy consulted for cangrelor infusion while patient is NPO and awaiting cortak placement trial for this Friday 6/12. Patient did not receive any plavix loading during PCI on 6/9 per [name] [name]'s conversation with [name] [name] due to the patient being on plavix PTA daily, last dose was at home on 06/16/08. .Marland Kitchen Patient continues on vent without po access, hopeful for extubation today so that po's can be trialed. No changes to cangrelor at this time. CBC stable, no bleeding noted.   Goal of Therapy:  Anti-platelet coverage for secondary prevention Monitor platelets by anticoagulation protocol:  Yes   Plan:  Continue cangrelor IV infusion at 0.68mg/kg/min Monitor signs/symptoms of bleeding, Daily CBC Follow-up cortrak placement on Friday to transition to back to plavix.   Thank you for the interesting consult and for involving pharmacy in this patient's care.  FErin HearingPharmD., BCPS Clinical Pharmacist 06/20/2018 9:42 AM

## 2018-06-20 NOTE — Procedures (Signed)
Cortrak  Person Inserting Tube:  Shanese Riemenschneider, RD Tube Type:  Cortrak - 43 inches Tube Location:  Left nare Initial Placement:  Stomach Secured by: Bridle Technique Used to Measure Tube Placement:  Documented cm marking at nare/ corner of mouth Cortrak Secured At:  60 cm   X-ray is required, abdominal x-ray has been ordered by the Cortrak team. Please confirm tube placement before using the Cortrak tube.   If the tube becomes dislodged please keep the tube and contact the Cortrak team at [email or web address] ([email or web address]) for replacement.  If after hours and replacement cannot be delayed, place a NG tube and confirm placement with an abdominal x-ray.    Mariana Single RD, LDN Clinical Nutrition Pager # [phone]

## 2018-06-20 NOTE — Progress Notes (Signed)
Nutrition Follow-up  DOCUMENTATION CODES:   Not applicable  INTERVENTION:   Tube feeding via Cortrak: - Start Vital 1.5 @ 25 ml/hr and increase rate by 10 ml Q 6 hours to goal rate of 45 ml/hr (1080 ml daily) - Pro-stat 30 ml TID  Tube feeding regimen at goal provides 1920 kcal, 118 grams of protein, and 821 ml of H2O (100% of needs).  - Recommend addition of B complex with vitamin C  NUTRITION DIAGNOSIS:   Increased nutrient needs related to acute illness as evidenced by estimated needs.  Ongoing, being addressed via TF  GOAL:   Provide needs based on ASPEN/SCCM guidelines  Met via TF at goal  MONITOR:   Diet advancement, TF tolerance, Skin, Vent status, Weight trends, Labs, I & O's  REASON FOR ASSESSMENT:   Consult Enteral/tube feeding initiation and management  ASSESSMENT:   Patient with PMH significant for CAD s/p CABG with multiple PCI and PTCA interventions, ischemic cardiomyopathy, ESRD on HD, DM, HTN, HLD, PUD, CVA, and colon cancer s/p resection. Presents this admission VT cardiac arrest during iHD treatment.   6/10 - CRRT initiated, OGT placement attempts all unsuccessful 6/11 - rewarming started 6/12 - gastric Cortrak placed  Discussed pt with RN and during ICU rounds. Pt remains on CRRT. All sedation off.  Pt awake on the vent.  RD placed verbal with readback consult to start TF per discussion with Dr. Nelda Marseille. Discussed plan with RN.  Patient is currently intubated on ventilator support MV: 7.9 L/min Temp (24hrs), Avg:98.7 F (37.1 C), Min:98.4 F (36.9 C), Max:99 F (37.2 C) BP (a-line): 139/44 MAP (a-line): 70  Medications reviewed and include: Sensipar, Hectorol, ferric citrate, SSI, Levemir 5 units BID, IV Pepcid  Drips: Amiodarone, Cangrelor, Levophed  Labs reviewed: sodium 133 CBG's: 103-258 x 12 hours  UOP: 7 ml x 24 hours CRRT UF: 3358 ml x 24 hours I/O's: -1.1 L since admit  NUTRITION - FOCUSED PHYSICAL EXAM:    Most  Recent Value  Orbital Region  Moderate depletion  Upper Arm Region  Moderate depletion  Thoracic and Lumbar Region  Unable to assess  Buccal Region  Moderate depletion  Temple Region  Moderate depletion  Clavicle Bone Region  Moderate depletion  Clavicle and Acromion Bone Region  Moderate depletion  Scapular Bone Region  Unable to assess  Dorsal Hand  Mild depletion  Patellar Region  Mild depletion  Anterior Thigh Region  Moderate depletion  Posterior Calf Region  Unable to assess  Edema (RD Assessment)  None  Hair  Reviewed  Eyes  Reviewed  Mouth  Unable to assess  Skin  Reviewed  Nails  Reviewed       Diet Order:   Diet Order            Diet NPO time specified  Diet effective now              EDUCATION NEEDS:   Not appropriate for education at this time  Skin:  Skin Assessment: Reviewed RN Assessment  Last BM:  no documented BM  Height:   Ht Readings from Last 1 Encounters:  06/29/2018 _0  (1.651 m)    Weight:   Wt Readings from Last 1 Encounters:  06/20/18 67.5 kg    Ideal Body Weight:  61.8 kg  BMI:  Body mass index is 24.77 kg/m.  Estimated Nutritional Needs:   Kcal:  1905 kcal  Protein:  110-130 grams  Fluid:  >/= 2 L/day  Kate Jablonski Anacristina Steffek, MS, RD, LDN Inpatient Clinical Dietitian Pager: [phone] Weekend/After Hours: [phone]  

## 2018-06-20 NOTE — Progress Notes (Addendum)
Progress Note  Patient Name: [name] [name] Date of Encounter: 06/20/2018  Primary Cardiologist: Dr. Ellyn Hack  Subjective   Intubated but sedation and paralytic dc.  Hope for potential extubation later today.  Inpatient Medications    Scheduled Meds: . artificial tears  1 application Both Eyes K9O  . aspirin  150 mg Rectal Daily  . atorvastatin  40 mg Oral QPM  . chlorhexidine gluconate (MEDLINE KIT)  15 mL Mouth Rinse BID  . Chlorhexidine Gluconate Cloth  6 each Topical Daily  . Chlorhexidine Gluconate Cloth  6 each Topical Q0600  . Chlorhexidine Gluconate Cloth  6 each Topical Daily  . cinacalcet  30 mg Oral Q supper  . dextrose      . doxercalciferol  1 mcg Intravenous Q T,Th,Sa-HD  . fentaNYL (SUBLIMAZE) injection  50 mcg Intravenous Once  . ferric citrate  420 mg Oral TID WC  . heparin  5,000 Units Subcutaneous Q8H  . insulin aspart  1-3 Units Subcutaneous Q4H  . insulin detemir  5 Units Subcutaneous Q12H  . levothyroxine  62.5 mcg Intravenous Daily  . mouth rinse  15 mL Mouth Rinse 10 times per day  . midazolam  1 mg Intravenous Once  . nystatin  5 mL Oral QID  . sodium chloride flush  10-40 mL Intracatheter Q12H  . sodium chloride flush  3 mL Intravenous Q12H   Continuous Infusions: . sodium chloride    . sodium chloride Stopped (06/18/18 1219)  . amiodarone 15 mg/hr (06/20/18 0700)  . cangrelor 50 mg in NS 250 mL 0.75 mcg/kg/min (06/20/18 0700)  . cisatracurium (NIMBEX) infusion Stopped (06/19/18 1412)  . famotidine (PEPCID) IV Stopped (06/19/18 2147)  . fentaNYL infusion INTRAVENOUS 100 mcg/hr (06/20/18 0700)  . midazolam 2 mg/hr (06/20/18 0700)  . norepinephrine (LEVOPHED) Adult infusion 11 mcg/min (06/20/18 0700)  . prismasol BGK 2/2.5 dialysis solution 2,000 mL/hr at 06/19/18 1733  . prismasol BGK 2/2.5 replacement solution 500 mL/hr at 06/19/18 0904  . prismasol BGK 2/2.5 replacement solution 300 mL/hr at 06/19/18 0555   PRN Meds: sodium chloride,  fentaNYL, fentaNYL (SUBLIMAZE) injection, fentaNYL (SUBLIMAZE) injection, ferric citrate **AND** ferric citrate, heparin, heparin, iohexol, lidocaine-prilocaine, Melatonin, midazolam, sodium chloride flush, sodium chloride flush   Vital Signs    Vitals:   06/20/18 0700 06/20/18 0726 06/20/18 0727 06/20/18 0800  BP: (!) 108/25  (!) 142/44 (!) 111/27  Pulse:   82   Resp: _0 Temp:  98.8 F (37.1 C)  98.8 F (37.1 C)  TempSrc:    Core  SpO2:   98%   Weight:      Height:        Intake/Output Summary (Last 24 hours) at 06/20/2018 0810 Last data filed at 06/20/2018 0800 Gross per 24 hour  Intake 1920.91 ml  Output 3376 ml  Net -1455.09 ml    I/O since admission: -[place]   06/14/2018 1900 06/19/18 0257 06/20/18 0315  Weight: 68.3 kg 71.4 kg 67.5 kg    Telemetry    SB in 50 - 60s- Personally Reviewed  ECG    Will recheck today  ECG (independently read by me): Marked sinus bradycardia at 39; LBBB  Physical Exam   BP (!) 111/27   Pulse 82   Temp 98.8 F (37.1 C) (Core)   Resp 16   Ht 5' 5" (1.651 m)   Wt 67.5 kg   SpO2 98%   BMI 24.77 kg/m  General: Intubated; opening  eyes Skin: normal turgor, no rashes, warm and dry HEENT: Normocephalic, atraumatic. Pupils equal round and reactive to light; sclera anicteric; extraocular muscles intact;  Nose without nasal septal hypertrophy Mouth/Parynx intubated Neck: No JVD, no carotid bruits; normal carotid upstroke Lungs: clear to ausculatation and percussion; no wheezing or rales Chest wall: without tenderness to palpitation Heart: PMI not displaced, RRR, s1 s2 normal, 1/6 systolic murmur, no diastolic murmur, no rubs, gallops, thrills, or heaves Abdomen: soft, nontender; no hepatosplenomehaly, BS+; abdominal aorta nontender and not dilated by palpation. Back: no CVA tenderness Pulses 2+ Musculoskeletal: full range of motion, normal strength, no joint deformities Extremities: no clubbing cyanosis or  edema, Homan's sign negative  Neurologic: grossly nonfocal; Cranial nerves grossly wnl   Labs    Chemistry Recent Labs  Lab 06/19/18 1547 06/20/18 0351 06/20/18 0414 06/20/18 0428 06/20/18 0505  NA 135 140 151* 134* 133*  K 5.2* 2.8* <2.0* 4.7 4.5  CL 103 119*  --   --  100  CO2 23 16*  --   --  21*  GLUCOSE 104* 99  --   --  226*  BUN 23 11  --   --  14  CREATININE 3.44* 1.52*  --   --  2.59*  CALCIUM 8.3* 5.2*  --   --  8.5*  ALBUMIN 2.6* 1.5*  --   --  2.5*  GFRNONAA 16* 44*  --   --  23*  GFRAA 19* 50*  --   --  27*  ANIONGAP 9 5  --   --  12     Hematology Recent Labs  Lab 06/18/18 0450 06/19/18 0455 06/20/18 0414 06/20/18 0428 06/20/18 0505  WBC 20.0* 19.5*  --   --  17.7*  RBC 3.40* 3.85*  --   --  3.61*  HGB 11.3* 12.4* 13.3 11.9* 11.8*  HCT 34.1* 37.9* 39.0 35.0* 37.0*  MCV 100.3* 98.4  --   --  102.5*  MCH 33.2 32.2  --   --  32.7  MCHC 33.1 32.7  --   --  31.9  RDW 13.8 14.3  --   --  14.6  PLT 260 269  --   --  212    Cardiac Enzymes Recent Labs  Lab 06/09/2018 2014  TROPONINI 0.52*   No results for input(s): TROPIPOC in the last 168 hours.   BNPNo results for input(s): BNP, PROBNP in the last 168 hours.   DDimer No results for input(s): DDIMER in the last 168 hours.   Lipid Panel     Component Value Date/Time   CHOL 145 04/11/2018 0930   CHOL 162 06/28/2016 1427   TRIG 184.0 (H) 04/11/2018 0930   HDL 24.10 (L) 04/11/2018 0930   HDL 36 (L) 06/28/2016 1427   CHOLHDL 6 04/11/2018 0930   VLDL 36.8 04/11/2018 0930   LDLCALC 84 04/11/2018 0930   LDLCALC 97 06/28/2016 1427   LDLDIRECT 154.0 04/22/2017 0954     Radiology    Dg Chest Port 1 View  Result Date: 06/20/2018 CLINICAL DATA:  Endotracheal tube.  Respiratory failure. EXAM: PORTABLE CHEST 1 VIEW COMPARISON:  One-view chest x-ray 06/18/2018 FINDINGS: Endotracheal tube terminates 4 cm above the carina, in satisfactory position. Left subclavian line and right IJ lines are stable.  Heart is enlarged. Mild edema is present. Bibasilar opacities have increased. Small effusions are suspected. IMPRESSION: 1. Support apparatus is stable. 2. Low lung volumes with increasing bibasilar opacities, likely atelectasis. Infection is not excluded. 3.  Small effusions are suspected. Electronically Signed   By: San Morelle M.D.   On: 06/20/2018 06:34   Dg C-arm 1-60 Min  Result Date: 06/18/2018 INDICATION: Malnutition, encounter for feeding tube placement EXAM: DG C-ARM 61-120 MIN COMPARISON:  None. CONTRAST:  None FLUOROSCOPY TIME:  10 minutes. COMPLICATIONS: None immediate PROCEDURE: The feeding tube was lubricated with viscous lidocaine inserted into the mouth. Under intermittent fluoroscopic guidance, the feeding tube was advanced through the oropharynx and into the proximal esophagus. Despite multiple unsuccessful attempts, the feeding tube was unable to be passed into the stomach. A spot fluoroscopic image was saved for documentation purposes. The patient tolerated the procedure well without immediate postprocedural complication. FINDINGS: The feeding tube was advanced to the level of the carina. Multiple attempts to pass it beyond this level were unsuccessful. IMPRESSION: Unsuccessful fluoroscopic guided placement of feeding tube using intermittent fluoroscopic guidance. Electronically Signed   By: Kerby Moors M.D.   On: 06/18/2018 17:33    Cardiac Studies    CULPRIT LESION: Ost RCA to Prox RCA lesion is 90% stenosed. Prox RCA lesion is 45% stenosed. -Moderate-severely calcified  A drug-eluting stent was successfully placed using a STENT RESOLUTE [id number]. Postdilated in tapered fashion from 3.7 down to 3.2 mm  Post intervention, there is a 0% residual stenosis.  ------------------------------------------------------------  Mid RCA-2 overlapping stent segment is ~ 65% stenosed.  Scoring balloon angioplasty was performed using a BALLOON WOLVERINE 3.00X6.  Post  intervention, there is a 15% residual stenosis.  ---------------------------------------------------------------------  Dist RCA lesion is 45% stenosed. Post Atrio lesion is 100% stenosed.   SUMMARY  Successful orbital atherectomy based DES PCI of ostial-proximal RCA (Resolute Onyx DES 3.0 mm x 38 mm --> 3.7-3.2 mm taper post dilation)  Successful Scoring Balloon angioplasty and post dilation (with 3.5 mm [place] balloon) of mid RCA ISR  Intermittent pacing with Temporary Pacemaker-removed post PCI  Successful defibrillation of Ventricular Tachycardia-1 shock 200 J   RECOMMENDATIONS  Overnight observation post PCI with sheath removal in PACU holding area  Continue home medications as prescribed  He will need dialysis today and anticipate discharge tomorrow  He already has scheduled follow-up    Intervention      Patient Profile     78 y.o. male with ESRD on dialysis, MVCAD, s/p CABG, who presented to hospital on to undergo atherectomy of severely calcifed RCA.  Assessment & Plan    1. CAD: severe MVCAD as noted above ; s/p elective CSI atherectomy to RCA yesterday. Pt had been on chronic plavix PTA. Now on cagrelor since no oral tube or Cortrak and ASA PR.  If extubated later dc cangrelor , and re-institute Plavix with 300 mg load and resume 75 mg daily.  2. Cardiac Arrest: Developed recurrent episodes of NSVT and required defibrillation in cath lab for VT and last evening developed VT cardiac arrest in dialysis lab in setting of K 7.7 and hypoMg.  Sucessufully resuscitated; was on amiodarone, lidocaine, levophed, hypothermia, intubated, sedated and paralyzed on versed, fentanyl and nimbez.  Suspect electrolyte  issues in arrest  Excellent result at PCI.  Now on amiodarone and levophed mcg/min. If extubated and able to take meds then transition to amiodarone 400 mg bid x1 week then 400 mg daily.  3. LBBB;  ECG earlier today with marked SB at 39; HR in the 40s 6/10 lidocaine  dc'd,  Amiodarone decreased  from 30 mg/h to 15 mg/h. On hypothermic protocol with T 33C also contributing to bradycardia yesterday.  HR  Now in in the 80s. Sinus rhthm  4. Hypotension: continues on levophed, BP low yesterday; now on 12   5. Antiplatelet RX; Pt was on plavix pre-procedure. Intubated and unable to pass NG or feeding tube. If extubated CorTrak team  to attempt placement if unable to swallow meds.  6. Mechanical ventilation: followed by CCM; hopeful extubation this afternoon once more alert.  7 ESRD:  Now  CVVHD per nephrology; K yesterday elevated; today now 4.7.   8. Hyperlipidemia: LDL 84; resume rosuvastatin at 4o mg once able to take oral meds.    CC: 35 minutes  Signed, Troy Sine, MD, [facility] 06/20/2018, 8:10 AM

## 2018-06-20 NOTE — Progress Notes (Signed)
ETT advanced 2cm per Dr.Yacoub order. ETT now 27@lip 

## 2018-06-20 NOTE — Progress Notes (Signed)
NAME:  [name] [name], MRN:  [id number], DOB:  Jul 13, 1940, LOS: 2 ADMISSION DATE:  06/18/2018, CONSULTATION DATE:  06/28/2018 REFERRING MD:  Code team, CHIEF COMPLAINT:  Cardiac arrest  Brief History   78 year old male admitted for progressive angina s/p successful balloon angioplasty of the mid RCA and atherectomy based DES PCI of the ostial-proximal RCA 6/9 am.  Had ongoing VT throughout the day attributed to hypokalemia and hypomagnesemia.  Prior to [facility], he was noted to have K 7.4.  Had VT cardiac arrest shortly after starting iHD with ROSC after 20 mins.  Intubated and transferred to ICU, remains unresponsive.    History of present illness   HPI obtained from medical chart review as patient is intubated and sedated on mechanical ventilation.    79 year old male with complex history of extensive CAD s/p CABG with multiple PCI and PTCA interventions on the RCA for in-stent restenosis, Ischemic cardiomyopathy, ERSD on HD, DMT2, HTN, HLD, PUD, anemia, hypothyroidism, stroke, colon cancer s/p resection admitted 6/9 with progressive angina.   He had reported he was taking up to 2 bottles of nitroglycerin a month with angina at rest and waking him up at night with some ongoing hypotension on his delayed follow-up cardiology appointment on 5/28.  He underwent cardiac catheterization on 6/5 which showed modest progression of his mid RCA in-stent restenosis but new de novo lesion in the proximal RCA that would require staged atherectomy based PCI.  Therefore he was admitted 6/9 and underwent successful balloon angioplasty of the mid RCA and atherectomy based DES PCI of the ostial-proximal RCA; he required defibrillation x 1 during case for ventricular tachycardia.  Throughout the day he had intermittent episode of asymptomatic VT and noted to have hypokalemia and hypomagnesemia which were replaced and he was started on amiodarone drip.  He had also complained of ongoing chest pain without relief after sublingual  nitro. He was noted to have K 7.2 prior to dialysis.  He was started on iHD and after 7 minutes decompensated to VT cardiac arrest with ROSC obtained after 20 mins with ACLS measures including temporizing measures for hyperkalemia, lidocaine, amiodarone, and defibrillation x 2.  He was intubated by RT.  Post arrest, transferred to ICU.  He remained unresponsive and normotensive in SR.  PCCM consulted for further medical and ventilator management.  Nephrology has asked for hemodialysis catheter to initiate CRRT.   Past Medical History  Extensive CAD s/p CABG with multiple PCI and PTCA interventions on the RCA for in-stent restenosis, Ischemic cardiomyopathy, ERSD on HD, DMT2, HTN, HLD, PUD, anemia, hypothyroidism, stroke, colon cancer s/p resection  Significant Hospital Events   6/9 Admit/ cardiac arrest  Consults:  Cardiology Nephrology  Procedures:  6/9 ETT>>> 6/9 R IJ HD cath>>>  Significant Diagnostic Tests:  Cardiac Cath June 13, 2018:  Severe multivessel native CAD with CTO of ostial LCx that has 3 major OM branches all occluded, LAD after diffusely diseased D1, and new ostial RCA disease with moderate in-stent restenosis of the mid RCA.  Patent LIMA-LAD and SVG-OM 2 with occluded side branch of OM 2 and essentially subtotal occlusion of OM 2 that is not PCI amenable   Despite significant CAD, relatively preserved LVEF and EDP  6/9 LHC  >>  CULPRIT LESION: Ost RCA to Prox RCA lesion is 90% stenosed. Prox RCA lesion is 45% stenosed. -Moderate-severely calcified  A drug-eluting stent was successfully placed using a STENT RESOLUTE [id number]. Postdilated in tapered fashion from 3.7  down to 3.2 mm  Post intervention, there is a 0% residual stenosis.  ------------------------------------------------------------  Mid RCA-2 overlapping stent segment is ~ 65% stenosed.  Scoring balloon angioplasty was performed using a BALLOON WOLVERINE 3.00X6.  Post intervention, there is a 15%  residual stenosis.  ---------------------------------------------------------------------  Dist RCA lesion is 45% stenosed. Post Atrio lesion is 100% stenosed.  Micro Data:   Antimicrobials:   Interim history/subjective:   No events overnight, off sedation, remains on pressors  Objective   Blood pressure (!) 142/44, pulse 82, temperature 98.8 F (37.1 C), resp. rate 16, height 5\' 5"  (1.651 m), weight 67.5 kg, SpO2 98 %. CVP:  [4 mmHg-15 mmHg] 4 mmHg  Vent Mode: PRVC FiO2 (%):  [40 %] 40 % Set Rate:  [16 bmp] 16 bmp Vt Set:  [500 mL] 500 mL PEEP:  [5 cmH20] 5 cmH20 Plateau Pressure:  [16 cmH20-19 cmH20] 18 cmH20   Intake/Output Summary (Last 24 hours) at 06/20/2018 0741 Last data filed at 06/20/2018 0700 Gross per 24 hour  Intake 2071.08 ml  Output 3365 ml  Net -1293.92 ml   Filed Weights   07/08/2018 1900 06/19/18 0257 06/20/18 0315  Weight: 68.3 kg 71.4 kg 67.5 kg   Examination: General:  Acute to chronically ill appearing male, opens eyes to voice but not command HEENT: [place]/AT, PERRL, EOM-I and MMM, ETT in place Neuro: Opens eyes to voice but not command, withdraws to pain diffusely CV: RRR, Nl S1/S2 and -M/R/G PULM: Coarse BS diffusely GI: Soft, NT, ND and +BS Extremities: -edema and -tenderness Skin: no obvious rash   I reviewed CXR myself, ETT 4.2 above carina, will move 2 cm down.  Resolved Hospital Problem list    Assessment & Plan:   VT cardiac arrest s/p recurrent VT w/ electrolyte imbalances Progressive Angina w/ Extensive CAD s/p prior CABG with multiple PCI and PTCA interventions on the RCA for in-stent restenosis s/p 6/9 Balloon angioplasty and atherectomy based DES PCI of the ostial-proximal RCA Ischemic cardiomyopathy  P:  Continue tele monitoring Levophed down to 11 mcg for MAP of 65 mmHg Amio per cards Follow electrolytes and replete K goal 4, mag goal 2  TTE per cards Cortrak placement today per nutrition has been arranged, see if able, if  able then change Kangralor to PO anti-plat. Kangralor since no PO access Rectal asa until enteral access obtained   Acute encephalopathy post cardiac arrest w/ ROSC after 20 mins P:  TTM protocol in place, 33 deg, completed D/C sedation to evaluate mental status, anticipate will need time to clear sedative given renal disease Hold off EEG and CT for now since he is slowly improving May need to consider a neuro consult if no improvement  Acute respiratory insufficieny in the setting of cardiac arrest  P:  Maintain on full vent support given apnea on 15/5 until sedatives are slowly cleared Maintain sats >90%  Wean FiO2 and PEEP for sat of 88-92%  ESRD on iHD P:  Catheter in place  Dialysis need per nephrology, currently on CRRT running -50  DM w/hyperglycemia P:  Insulin ggt Can make plans to transition to subq when possible   Hx HTN P:  Holding imdur and metoprolol   HLD P:  Continue statin   Reported dysphagia by family P:  Unable to place OGT by MD; considerable postpharyngeal swelling  Nutrition to place cortrak today if able, if not may need to consider IR  Best practice:  Diet: NPO Pain/Anxiety/Delirium protocol (if indicated): fentanyl/  versed/ nimbex VAP protocol (if indicated): yes DVT prophylaxis: heparin SQ GI prophylaxis: pepcid Glucose control: insulin gtt per protocol Mobility: BR Code Status: Full  Family Communication: per primary  Disposition:  ICU  Labs   CBC: Recent Labs  Lab 06/20/2018 0733  06/18/18 0450 06/19/18 0455 06/20/18 0414 06/20/18 0428 06/20/18 0505  WBC 9.7  --  20.0* 19.5*  --   --  17.7*  HGB 11.4*   < > 11.3* 12.4* 13.3 11.9* 11.8*  HCT 35.9*   < > 34.1* 37.9* 39.0 35.0* 37.0*  MCV 103.8*  --  100.3* 98.4  --   --  102.5*  PLT 253  --  260 269  --   --  212   < > = values in this interval not displayed.   Basic Metabolic Panel: Recent Labs  Lab 06/28/2018 0807 06/12/2018 1811  06/18/18 0450  06/18/18 1613  06/19/18 0022 06/19/18 0454 06/19/18 1547 06/20/18 0351 06/20/18 0414 06/20/18 0428 06/20/18 0505  NA  --  132*   < > 133*   < > 133* 135 136 135 140 151* 134* 133*  K 3.2* 7.4*   < > 5.3*   < > 4.7 5.5* 6.1* 5.2* 2.8* <2.0* 4.7 4.5  CL  --  93*   < > 95*   < > 98 101 102 103 119*  --   --  100  CO2  --  20*   < > 21*   < > 21* 22 22 23  16*  --   --  21*  GLUCOSE  --  211*   < > 235*   < > 139* 120* 130* 104* 99  --   --  226*  BUN  --  62*   < > 61*   < > 54* 36* 33* 23 11  --   --  14  CREATININE  --  7.93*   < > 7.68*   < > 6.11* 4.55* 4.36* 3.44* 1.52*  --   --  2.59*  CALCIUM  --  7.8*   < > 8.6*   < > 8.3* 8.1* 8.4* 8.3* 5.2*  --   --  8.5*  MG 1.0* 1.7  --  3.2*  --   --   --  2.5*  --   --   --   --  2.4  PHOS  --   --    < > 2.7  --  2.9  --  2.5 3.9 2.3*  --   --  3.9   < > = values in this interval not displayed.   GFR: Estimated Creatinine Clearance: 20.8 mL/min (A) (by C-G formula based on SCr of 2.59 mg/dL (H)). Recent Labs  Lab 06/20/2018 0733 06/18/18 0450 06/19/18 0455 06/20/18 0505  WBC 9.7 20.0* 19.5* 17.7*   Liver Function Tests: Recent Labs  Lab 06/18/18 1613 06/19/18 0454 06/19/18 1547 06/20/18 0351 06/20/18 0505  ALBUMIN 2.8* 2.9* 2.6* 1.5* 2.5*   No results for input(s): LIPASE, AMYLASE in the last 168 hours. No results for input(s): AMMONIA in the last 168 hours.  ABG    Component Value Date/Time   PHART 7.393 06/20/2018 0428   PCO2ART 39.9 06/20/2018 0428   PO2ART 100.0 06/20/2018 0428   HCO3 24.3 06/20/2018 0428   TCO2 25 06/20/2018 0428   ACIDBASEDEF 1.0 06/20/2018 0428   O2SAT 98.0 06/20/2018 0428   Coagulation Profile: Recent Labs  Lab 06/18/18 0206 06/18/18 1000  INR 1.0 1.2  Cardiac Enzymes: Recent Labs  Lab 06/20/2018 2014  TROPONINI 0.52*   HbA1C: Hgb A1c MFr Bld  Date/Time Value Ref Range Status  04/11/2018 09:30 AM 5.4 4.6 - 6.5 % Final    Comment:    Glycemic Control Guidelines for People with Diabetes:Non  Diabetic:  <6%Goal of Therapy: <7%Additional Action Suggested:  >8%   10/21/2017 09:25 AM 5.5 4.6 - 6.5 % Final    Comment:    Glycemic Control Guidelines for People with Diabetes:Non Diabetic:  <6%Goal of Therapy: <7%Additional Action Suggested:  >8%    CBG: Recent Labs  Lab 06/19/18 2324 06/20/18 0402 06/20/18 0427 06/20/18 0435 06/20/18 0727  GLUCAP 105* 63* 258* 229* 152*   The patient is critically ill with multiple organ systems failure and requires high complexity decision making for assessment and support, frequent evaluation and titration of therapies, application of advanced monitoring technologies and extensive interpretation of multiple databases.   Critical Care Time devoted to patient care services described in this note is  33  Minutes. This time reflects time of care of this signee Dr Jennet Maduro. This critical care time does not reflect procedure time, or teaching time or supervisory time of PA/NP/Med student/Med Resident etc but could involve care discussion time.  Rush Farmer, M.D. [facility] Pulmonary/Critical Care Medicine. Pager: [phone]. After hours pager: [phone].

## 2018-06-21 ENCOUNTER — Inpatient Hospital Stay (HOSPITAL_COMMUNITY): Payer: Medicare Other

## 2018-06-21 DIAGNOSIS — R570 Cardiogenic shock: Secondary | ICD-10-CM

## 2018-06-21 DIAGNOSIS — G931 Anoxic brain damage, not elsewhere classified: Secondary | ICD-10-CM

## 2018-06-21 LAB — RENAL FUNCTION PANEL
Albumin: 2.4 g/dL — ABNORMAL LOW (ref 3.5–5.0)
Anion gap: 11 (ref 5–15)
BUN: 10 mg/dL (ref 8–23)
CO2: 22 mmol/L (ref 22–32)
Calcium: 9 mg/dL (ref 8.9–10.3)
Chloride: 101 mmol/L (ref 98–111)
Creatinine, Ser: 1.89 mg/dL — ABNORMAL HIGH (ref 0.61–1.24)
GFR calc Af Amer: 39 mL/min — ABNORMAL LOW (ref >60)
GFR calc non Af Amer: 33 mL/min — ABNORMAL LOW (ref >60)
Glucose, Bld: 162 mg/dL — ABNORMAL HIGH (ref 70–99)
Phosphorus: 3.4 mg/dL (ref 2.5–4.6)
Potassium: 3.7 mmol/L (ref 3.5–5.1)
Sodium: 134 mmol/L — ABNORMAL LOW (ref 135–145)

## 2018-06-21 LAB — CBC
HCT: 37.5 % — ABNORMAL LOW (ref 39.0–52.0)
Hemoglobin: 12 g/dL — ABNORMAL LOW (ref 13.0–17.0)
MCH: 32.5 pg (ref 26.0–34.0)
MCHC: 32 g/dL (ref 30.0–36.0)
MCV: 101.6 fL — ABNORMAL HIGH (ref 80.0–100.0)
Platelets: 191 10*3/uL (ref 150–400)
RBC: 3.69 MIL/uL — ABNORMAL LOW (ref 4.22–5.81)
RDW: 14.6 % (ref 11.5–15.5)
WBC: 14 10*3/uL — ABNORMAL HIGH (ref 4.0–10.5)
nRBC: 0 % (ref 0.0–0.2)

## 2018-06-21 LAB — GLUCOSE, CAPILLARY
Glucose-Capillary: 148 mg/dL — ABNORMAL HIGH (ref 70–99)
Glucose-Capillary: 162 mg/dL — ABNORMAL HIGH (ref 70–99)
Glucose-Capillary: 150 mg/dL — ABNORMAL HIGH (ref 70–99)
Glucose-Capillary: 126 mg/dL — ABNORMAL HIGH (ref 70–99)
Glucose-Capillary: 175 mg/dL — ABNORMAL HIGH (ref 70–99)
Glucose-Capillary: 155 mg/dL — ABNORMAL HIGH (ref 70–99)
Glucose-Capillary: 170 mg/dL — ABNORMAL HIGH (ref 70–99)

## 2018-06-21 LAB — POCT I-STAT 7, (LYTES, BLD GAS, ICA,H+H)
Acid-Base Excess: 1 mmol/L (ref 0.0–2.0)
Bicarbonate: 24.8 mmol/L (ref 20.0–28.0)
Calcium, Ion: 1.24 mmol/L (ref 1.15–1.40)
HCT: 36 % — ABNORMAL LOW (ref 39.0–52.0)
Hemoglobin: 12.2 g/dL — ABNORMAL LOW (ref 13.0–17.0)
O2 Saturation: 98 %
Patient temperature: 37
Potassium: 3.8 mmol/L (ref 3.5–5.1)
Sodium: 135 mmol/L (ref 135–145)
TCO2: 26 mmol/L (ref 22–32)
pCO2 arterial: 36.9 mmHg (ref 32.0–48.0)
pH, Arterial: 7.435 (ref 7.350–7.450)
pO2, Arterial: 95 mmHg (ref 83.0–108.0)

## 2018-06-21 LAB — MAGNESIUM: Magnesium: 2.6 mg/dL — ABNORMAL HIGH (ref 1.7–2.4)

## 2018-06-21 MED ORDER — FENTANYL CITRATE (PF) 100 MCG/2ML IJ SOLN
25.0000 ug | INTRAMUSCULAR | Status: DC | PRN
  Administered 2018-06-22: 02:00:00 50 ug via INTRAVENOUS
  Filled 2018-06-21: qty 2

## 2018-06-21 MED ORDER — CLOPIDOGREL BISULFATE 300 MG PO TABS
600.0000 mg | ORAL_TABLET | Freq: Once | ORAL | Status: AC
  Administered 2018-06-21: 600 mg
  Filled 2018-06-21: qty 2

## 2018-06-21 MED ORDER — PRISMASOL BGK 0/2.5 32-2.5 MEQ/L IV SOLN: INTRAVENOUS | Status: DC

## 2018-06-21 MED ORDER — ASPIRIN 81 MG PO CHEW
81.0000 mg | CHEWABLE_TABLET | Freq: Every day | ORAL | Status: DC
  Administered 2018-06-21 – 2018-06-23 (×3): 81 mg
  Filled 2018-06-21 (×3): qty 1

## 2018-06-21 MED ORDER — HEPARIN SODIUM (PORCINE) 1000 UNIT/ML DIALYSIS
1000.0000 [IU] | INTRAMUSCULAR | Status: DC | PRN
  Administered 2018-06-21: 10:00:00 2200 [IU] via INTRAVENOUS_CENTRAL
  Filled 2018-06-21 (×2): qty 6
  Filled 2018-06-21: qty 4

## 2018-06-21 MED ORDER — LEVOTHYROXINE SODIUM 25 MCG PO TABS
125.0000 ug | ORAL_TABLET | Freq: Every day | ORAL | Status: DC
  Administered 2018-06-21 – 2018-06-23 (×3): 125 ug
  Filled 2018-06-21 (×3): qty 1

## 2018-06-21 MED ORDER — MIDAZOLAM HCL 2 MG/2ML IJ SOLN
0.5000 mg | INTRAMUSCULAR | Status: DC | PRN
  Administered 2018-06-22: 1 mg via INTRAVENOUS
  Filled 2018-06-21: qty 2

## 2018-06-21 MED ORDER — CLOPIDOGREL BISULFATE 75 MG PO TABS
75.0000 mg | ORAL_TABLET | Freq: Every day | ORAL | Status: DC
  Administered 2018-06-22 – 2018-06-23 (×2): 75 mg
  Filled 2018-06-21 (×2): qty 1

## 2018-06-21 MED ORDER — AMIODARONE HCL 200 MG PO TABS
400.0000 mg | ORAL_TABLET | Freq: Two times a day (BID) | ORAL | Status: DC
  Administered 2018-06-21 – 2018-06-23 (×5): 400 mg
  Filled 2018-06-21 (×6): qty 2

## 2018-06-21 NOTE — Progress Notes (Signed)
Pads removed at this time.

## 2018-06-21 NOTE — Progress Notes (Signed)
NAME:  [name] [name], MRN:  [id number], DOB:  [birth year], LOS: 3 ADMISSION DATE:  06/16/2018, CONSULTATION DATE:  06/11/2018 REFERRING MD:  Code team, CHIEF COMPLAINT:  Cardiac arrest  Brief History   78 year old male admitted for progressive angina s/p successful balloon angioplasty of the mid RCA and atherectomy based DES PCI of the ostial-proximal RCA 6/9 am.  Had ongoing VT throughout the day attributed to hypokalemia and hypomagnesemia.  Prior to [facility], he was noted to have K 7.4.  Had VT cardiac arrest shortly after starting iHD with ROSC after 20 mins.  Intubated and transferred to ICU, remains unresponsive.    History of present illness   HPI obtained from medical chart review as patient is intubated and sedated on mechanical ventilation.    78 year old male with complex history of extensive CAD s/p CABG with multiple PCI and PTCA interventions on the RCA for in-stent restenosis, Ischemic cardiomyopathy, ERSD on HD, DMT2, HTN, HLD, PUD, anemia, hypothyroidism, stroke, colon cancer s/p resection admitted 6/9 with progressive angina.   He had reported he was taking up to 2 bottles of nitroglycerin a month with angina at rest and waking him up at night with some ongoing hypotension on his delayed follow-up cardiology appointment on 5/28.  He underwent cardiac catheterization on 6/5 which showed modest progression of his mid RCA in-stent restenosis but new de novo lesion in the proximal RCA that would require staged atherectomy based PCI.  Therefore he was admitted 6/9 and underwent successful balloon angioplasty of the mid RCA and atherectomy based DES PCI of the ostial-proximal RCA; he required defibrillation x 1 during case for ventricular tachycardia.  Throughout the day he had intermittent episode of asymptomatic VT and noted to have hypokalemia and hypomagnesemia which were replaced and he was started on amiodarone drip.  He had also complained of ongoing chest pain without relief after sublingual  nitro. He was noted to have K 7.2 prior to dialysis.  He was started on iHD and after 7 minutes decompensated to VT cardiac arrest with ROSC obtained after 20 mins with ACLS measures including temporizing measures for hyperkalemia, lidocaine, amiodarone, and defibrillation x 2.  He was intubated by RT.  Post arrest, transferred to ICU.  He remained unresponsive and normotensive in SR.  PCCM consulted for further medical and ventilator management.  Nephrology has asked for hemodialysis catheter to initiate CRRT.   Past Medical History  Extensive CAD s/p CABG with multiple PCI and PTCA interventions on the RCA for in-stent restenosis, Ischemic cardiomyopathy, ERSD on HD, DMT2, HTN, HLD, PUD, anemia, hypothyroidism, stroke, colon cancer s/p resection  Significant Hospital Events   6/9 Admit/ cardiac arrest  Consults:  Cardiology Nephrology  Procedures:  6/9 ETT>>> 6/9 R IJ HD cath>>>  Significant Diagnostic Tests:  Cardiac Cath June 13, 2018:  Severe multivessel native CAD with CTO of ostial LCx that has 3 major OM branches all occluded, LAD after diffusely diseased D1, and new ostial RCA disease with moderate in-stent restenosis of the mid RCA.  Patent LIMA-LAD and SVG-OM 2 with occluded side branch of OM 2 and essentially subtotal occlusion of OM 2 that is not PCI amenable   Despite significant CAD, relatively preserved LVEF and EDP  6/9 LHC  >>  CULPRIT LESION: Ost RCA to Prox RCA lesion is 90% stenosed. Prox RCA lesion is 45% stenosed. -Moderate-severely calcified  A drug-eluting stent was successfully placed using a STENT RESOLUTE [id number]. Postdilated in tapered fashion from 3.7  down to 3.2 mm  Post intervention, there is a 0% residual stenosis.  ------------------------------------------------------------  Mid RCA-2 overlapping stent segment is ~ 65% stenosed.  Scoring balloon angioplasty was performed using a BALLOON WOLVERINE 3.00X6.  Post intervention, there is a 15%  residual stenosis.  ---------------------------------------------------------------------  Dist RCA lesion is 45% stenosed. Post Atrio lesion is 100% stenosed.  Micro Data:   Antimicrobials:   Interim history/subjective:   Some agitation overnight, off pressors but sedate  Objective   Blood pressure (!) 107/26, pulse 84, temperature 97.9 F (36.6 C), resp. rate 16, height 5\' 5"  (1.651 m), weight 62.2 kg, SpO2 98 %. CVP:  [2 mmHg-9 mmHg] 6 mmHg  Vent Mode: PRVC FiO2 (%):  [30 %] 30 % Set Rate:  [16 bmp] 16 bmp Vt Set:  [500 mL] 500 mL PEEP:  [5 cmH20] 5 cmH20 Pressure Support:  [10 cmH20] 10 cmH20 Plateau Pressure:  [10 cmH20-34 cmH20] 19 cmH20   Intake/Output Summary (Last 24 hours) at 06/21/2018 0816 Last data filed at 06/21/2018 0800 Gross per 24 hour  Intake 1328.25 ml  Output 3450 ml  Net -2121.75 ml   Filed Weights   06/19/18 0257 06/20/18 0315 06/21/18 0545  Weight: 71.4 kg 67.5 kg 62.2 kg   Examination: General:  Acute on chronically ill appearing, NAD, sedate HEENT: [place]/AT, PERRL, EOM-I and MMM, ETT in place Neuro: Opens eyes to voice but not command, withdraws to pain diffusely CV: RRR, Nl S1/S2 and -M/R/G PULM: Coarse BS diffusely GI: Soft, NT, ND and +BS Extremities: -edema and -tenderness Skin: no obvious rash   I reviewed CXR myself, ETT is in a good position  Resolved Hospital Problem list    Assessment & Plan:   VT cardiac arrest s/p recurrent VT w/ electrolyte imbalances Progressive Angina w/ Extensive CAD s/p prior CABG with multiple PCI and PTCA interventions on the RCA for in-stent restenosis s/p 6/9 Balloon angioplasty and atherectomy based DES PCI of the ostial-proximal RCA Ischemic cardiomyopathy  P:  Tele monitoring D/C levophed Amio off Follow electrolytes and replete K goal 4, mag goal 2  TTE per cards Cortrak in place Kangralor off, plavix now PO ASA  Acute encephalopathy post cardiac arrest w/ ROSC after 20 mins P:  TTM  protocol in place, 33 deg, completed D/C all sedation including PRN Hold off EEG and CT for now since he is slowly improving May need to consider a neuro consult if no improvement  Acute respiratory insufficieny in the setting of cardiac arrest  P:  Begin PS trials today Hold off extubation given mental status Maintain sats >90%  Wean FiO2 and PEEP for sat of 88-92%  ESRD on iHD P:  Catheter in place  Dialysis need per nephrology, currently on CRRT running -100  DM w/hyperglycemia P:  Insulin ggt Can make plans to transition to subq when possible   Hx HTN P:  Holding imdur and metoprolol   HLD P:  Continue statin   Reported dysphagia by family P:  TF through cortrak  Best practice:  Diet: NPO Pain/Anxiety/Delirium protocol (if indicated): fentanyl/ versed/ nimbex VAP protocol (if indicated): yes DVT prophylaxis: heparin SQ GI prophylaxis: pepcid Glucose control: insulin gtt per protocol Mobility: BR Code Status: Full  Family Communication: per primary  Disposition:  ICU  Labs   CBC: Recent Labs  Lab 06/29/2018 0733  06/18/18 0450 06/19/18 0455 06/20/18 0414 06/20/18 0428 06/20/18 0505 06/21/18 0312 06/21/18 0326  WBC 9.7  --  20.0* 19.5*  --   --  17.7* 14.0*  --   HGB 11.4*   < > 11.3* 12.4* 13.3 11.9* 11.8* 12.0* 12.2*  HCT 35.9*   < > 34.1* 37.9* 39.0 35.0* 37.0* 37.5* 36.0*  MCV 103.8*  --  100.3* 98.4  --   --  102.5* 101.6*  --   PLT 253  --  260 269  --   --  212 191  --    < > = values in this interval not displayed.   Basic Metabolic Panel: Recent Labs  Lab 06/20/2018 1811  06/18/18 0450  06/19/18 0454 06/19/18 1547 06/20/18 0351  06/20/18 0428 06/20/18 0505 06/20/18 1600 06/21/18 0312 06/21/18 0326  NA 132*   < > 133*   < > 136 135 140   < > 134* 133* 136 134* 135  K 7.4*   < > 5.3*   < > 6.1* 5.2* 2.8*   < > 4.7 4.5 3.9 3.7 3.8  CL 93*   < > 95*   < > 102 103 119*  --   --  100 102 101  --   CO2 20*   < > 21*   < > 22 23 16*  --    --  21* 23 22  --   GLUCOSE 211*   < > 235*   < > 130* 104* 99  --   --  226* 129* 162*  --   BUN 62*   < > 61*   < > 33* 23 11  --   --  14 12 10   --   CREATININE 7.93*   < > 7.68*   < > 4.36* 3.44* 1.52*  --   --  2.59* 2.18* 1.89*  --   CALCIUM 7.8*   < > 8.6*   < > 8.4* 8.3* 5.2*  --   --  8.5* 8.9 9.0  --   MG 1.7  --  3.2*  --  2.5*  --   --   --   --  2.4  --  2.6*  --   PHOS  --   --  2.7   < > 2.5 3.9 2.3*  --   --  3.9 3.4 3.4  --    < > = values in this interval not displayed.   GFR: Estimated Creatinine Clearance: 28.5 mL/min (A) (by C-G formula based on SCr of 1.89 mg/dL (H)). Recent Labs  Lab 06/18/18 0450 06/19/18 0455 06/20/18 0505 06/21/18 0312  WBC 20.0* 19.5* 17.7* 14.0*   Liver Function Tests: Recent Labs  Lab 06/19/18 1547 06/20/18 0351 06/20/18 0505 06/20/18 1600 06/21/18 0312  ALBUMIN 2.6* 1.5* 2.5* 2.5* 2.4*   No results for input(s): LIPASE, AMYLASE in the last 168 hours. No results for input(s): AMMONIA in the last 168 hours.  ABG    Component Value Date/Time   PHART 7.435 06/21/2018 0326   PCO2ART 36.9 06/21/2018 0326   PO2ART 95.0 06/21/2018 0326   HCO3 24.8 06/21/2018 0326   TCO2 26 06/21/2018 0326   ACIDBASEDEF 1.0 06/20/2018 0428   O2SAT 98.0 06/21/2018 0326   Coagulation Profile: Recent Labs  Lab 06/18/18 0206 06/18/18 1000  INR 1.0 1.2   Cardiac Enzymes: Recent Labs  Lab 06/19/2018 2014  TROPONINI 0.52*   HbA1C: Hgb A1c MFr Bld  Date/Time Value Ref Range Status  04/11/2018 09:30 AM 5.4 4.6 - 6.5 % Final    Comment:    Glycemic Control Guidelines for People with Diabetes:Non Diabetic:  <6%Goal  of Therapy: <7%Additional Action Suggested:  >8%   10/21/2017 09:25 AM 5.5 4.6 - 6.5 % Final    Comment:    Glycemic Control Guidelines for People with Diabetes:Non Diabetic:  <6%Goal of Therapy: <7%Additional Action Suggested:  >8%    CBG: Recent Labs  Lab 06/20/18 1117 06/20/18 1604 06/20/18 1930 06/20/18 2315 06/21/18  0324  GLUCAP 103* 117* 148* 162* 150*   The patient is critically ill with multiple organ systems failure and requires high complexity decision making for assessment and support, frequent evaluation and titration of therapies, application of advanced monitoring technologies and extensive interpretation of multiple databases.   Critical Care Time devoted to patient care services described in this note is  33  Minutes. This time reflects time of care of this signee Dr Jennet Maduro. This critical care time does not reflect procedure time, or teaching time or supervisory time of PA/NP/Med student/Med Resident etc but could involve care discussion time.  Rush Farmer, M.D. [facility] Pulmonary/Critical Care Medicine. Pager: [phone]. After hours pager: [phone].

## 2018-06-21 NOTE — Progress Notes (Signed)
Patient ID: [name] [name], male   DOB: 01/26/1940, 78 y.o.   MRN: [id number]   Progress Note  Patient Name: [name] [name] Date of Encounter: 06/21/2018  Primary Cardiologist: Dr. Ellyn Hack  Subjective   Intubated/sedated but will awaken.   He is off norepinephrine this morning.  Remains on Cangrelor and amiodarone gtts.  He has a Cortrak now.  MAP stable, CVP 6.  On CVVH pulling 100 cc/hr.  FiO2 0.3 on vent.   Echo: EF 30-35%, akinetic apex, moderately decreased RV systolic function.   Inpatient Medications    Scheduled Meds:  aspirin  150 mg Rectal Daily   atorvastatin  40 mg Oral QPM   B-complex with vitamin C  1 tablet Per Tube Daily   chlorhexidine gluconate (MEDLINE KIT)  15 mL Mouth Rinse BID   Chlorhexidine Gluconate Cloth  6 each Topical Daily   Chlorhexidine Gluconate Cloth  6 each Topical Q0600   Chlorhexidine Gluconate Cloth  6 each Topical Daily   cinacalcet  30 mg Oral Q supper   doxercalciferol  1 mcg Intravenous Q T,Th,Sa-HD   feeding supplement (PRO-STAT SUGAR FREE 64)  30 mL Per Tube TID   fentaNYL (SUBLIMAZE) injection  50 mcg Intravenous Once   ferric citrate  420 mg Oral TID WC   heparin  5,000 Units Subcutaneous Q8H   insulin aspart  1-3 Units Subcutaneous Q4H   insulin detemir  5 Units Subcutaneous Q12H   levothyroxine  62.5 mcg Intravenous Daily   mouth rinse  15 mL Mouth Rinse 10 times per day   midazolam  1 mg Intravenous Once   nystatin  5 mL Oral QID   Continuous Infusions:  sodium chloride 10 mL/hr at 06/21/18 0700   amiodarone 15 mg/hr (06/21/18 0700)   cangrelor 50 mg in NS 250 mL 0.75 mcg/kg/min (06/21/18 0700)   famotidine (PEPCID) IV Stopped (06/20/18 2220)   feeding supplement (VITAL 1.5 CAL) 25 mL/hr at 06/21/18 0700   fentaNYL infusion INTRAVENOUS Stopped (06/20/18 0740)   midazolam Stopped (06/20/18 0740)   norepinephrine (LEVOPHED) Adult infusion Stopped (06/21/18 0725)   prismasol BGK 2/2.5 dialysis  solution 2,000 mL/hr at 06/21/18 0254   prismasol BGK 2/2.5 replacement solution 500 mL/hr at 06/21/18 0254   prismasol BGK 2/2.5 replacement solution 300 mL/hr at 06/20/18 1641   PRN Meds: fentaNYL, fentaNYL (SUBLIMAZE) injection, fentaNYL (SUBLIMAZE) injection, ferric citrate **AND** ferric citrate, heparin, heparin, iohexol, lidocaine-prilocaine, Melatonin, midazolam, sodium chloride flush   Vital Signs    Vitals:   06/21/18 0630 06/21/18 0645 06/21/18 0700 06/21/18 0715  BP:   (!) 101/29   Pulse: 82 86 84 92  Resp: _0 Temp:   97.9 F (36.6 C)   TempSrc:      SpO2: 95% 96% 96% 97%  Weight:      Height:        Intake/Output Summary (Last 24 hours) at 06/21/2018 0726 Last data filed at 06/21/2018 0700 Gross per 24 hour  Intake 1328.25 ml  Output 3439 ml  Net -2110.75 ml    I/O since admission: -[place] Weights   06/19/18 0257 06/20/18 0315 06/21/18 0545  Weight: 71.4 kg 67.5 kg 62.2 kg    Telemetry    NSR 80s - Personally Reviewed  Physical Exam   BP (!) 101/29    Pulse 92    Temp 97.9 F (36.6 C)    Resp 18    Ht 5' 5" (1.651 m)  Wt 62.2 kg    SpO2 97%    BMI 22.82 kg/m  General: Intubated/sedated Neck: No JVD, no thyromegaly or thyroid nodule.  Lungs: Decreased BS dependently CV: Nondisplaced PMI.  Heart regular S1/S2, no S3/S4, no murmur.  No peripheral edema.   Abdomen: Soft, nontender, no hepatosplenomegaly, no distention.  Skin: Intact without lesions or rashes.  Neurologic: Sedated on vent  Extremities: No clubbing or cyanosis.  HEENT: Normal.    Labs    Chemistry Recent Labs  Lab 06/20/18 0505 06/20/18 1600 06/21/18 0312 06/21/18 0326  NA 133* 136 134* 135  K 4.5 3.9 3.7 3.8  CL 100 102 101  --   CO2 21* 23 22  --   GLUCOSE 226* 129* 162*  --   BUN _0 --   CREATININE 2.59* 2.18* 1.89*  --   CALCIUM 8.5* 8.9 9.0  --   ALBUMIN 2.5* 2.5* 2.4*  --   GFRNONAA 23* 28* 33*  --   GFRAA 27* 33* 39*  --   ANIONGAP _1 --      Hematology Recent Labs  Lab 06/19/18 0455  06/20/18 0505 06/21/18 0312 06/21/18 0326  WBC 19.5*  --  17.7* 14.0*  --   RBC 3.85*  --  3.61* 3.69*  --   HGB 12.4*   < > 11.8* 12.0* 12.2*  HCT 37.9*   < > 37.0* 37.5* 36.0*  MCV 98.4  --  102.5* 101.6*  --   MCH 32.2  --  32.7 32.5  --   MCHC 32.7  --  31.9 32.0  --   RDW 14.3  --  14.6 14.6  --   PLT 269  --  212 191  --    < > = values in this interval not displayed.    Cardiac Enzymes Recent Labs  Lab 06/25/2018 2014  TROPONINI 0.52*   No results for input(s): TROPIPOC in the last 168 hours.   BNPNo results for input(s): BNP, PROBNP in the last 168 hours.   DDimer No results for input(s): DDIMER in the last 168 hours.   Lipid Panel     Component Value Date/Time   CHOL 145 04/11/2018 0930   CHOL 162 06/28/2016 1427   TRIG 184.0 (H) 04/11/2018 0930   HDL 24.10 (L) 04/11/2018 0930   HDL 36 (L) 06/28/2016 1427   CHOLHDL 6 04/11/2018 0930   VLDL 36.8 04/11/2018 0930   LDLCALC 84 04/11/2018 0930   LDLCALC 97 06/28/2016 1427   LDLDIRECT 154.0 04/22/2017 0954     Radiology    Dg Chest Port 1 View  Result Date: 06/20/2018 CLINICAL DATA:  Endotracheal tube.  Respiratory failure. EXAM: PORTABLE CHEST 1 VIEW COMPARISON:  One-view chest x-ray 06/18/2018 FINDINGS: Endotracheal tube terminates 4 cm above the carina, in satisfactory position. Left subclavian line and right IJ lines are stable. Heart is enlarged. Mild edema is present. Bibasilar opacities have increased. Small effusions are suspected. IMPRESSION: 1. Support apparatus is stable. 2. Low lung volumes with increasing bibasilar opacities, likely atelectasis. Infection is not excluded. 3. Small effusions are suspected. Electronically Signed   By: San Morelle M.D.   On: 06/20/2018 06:34   Dg Abd Portable 1v  Result Date: 06/20/2018 CLINICAL DATA:  Encounter for feeding tube placement. EXAM: PORTABLE ABDOMEN - 1 VIEW COMPARISON:  Radiograph of  November 20, 2006. FINDINGS: The bowel gas pattern is normal. Distal tip of feeding tube is seen in proximal stomach.  No radio-opaque calculi or other significant radiographic abnormality are seen. IMPRESSION: Distal tip of feeding tube seen in proximal stomach. Electronically Signed   By: Marijo Conception M.D.   On: 06/20/2018 15:29    Cardiac Studies    CULPRIT LESION: Ost RCA to Prox RCA lesion is 90% stenosed. Prox RCA lesion is 45% stenosed. -Moderate-severely calcified  A drug-eluting stent was successfully placed using a STENT RESOLUTE RCVE9.3Y10. Postdilated in tapered fashion from 3.7 down to 3.2 mm  Post intervention, there is a 0% residual stenosis.  ------------------------------------------------------------  Mid RCA-2 overlapping stent segment is ~ 65% stenosed.  Scoring balloon angioplasty was performed using a BALLOON WOLVERINE 3.00X6.  Post intervention, there is a 15% residual stenosis.  ---------------------------------------------------------------------  Dist RCA lesion is 45% stenosed. Post Atrio lesion is 100% stenosed.   SUMMARY  Successful orbital atherectomy based DES PCI of ostial-proximal RCA (Resolute Onyx DES 3.0 mm x 38 mm --> 3.7-3.2 mm taper post dilation)  Successful Scoring Balloon angioplasty and post dilation (with 3.5 mm [place] balloon) of mid RCA ISR  Intermittent pacing with Temporary Pacemaker-removed post PCI  Successful defibrillation of Ventricular Tachycardia-1 shock 200 J   RECOMMENDATIONS  Overnight observation post PCI with sheath removal in PACU holding area  Continue home medications as prescribed  He will need dialysis today and anticipate discharge tomorrow  He already has scheduled follow-up    Intervention      Patient Profile     78 y.o. male with ESRD on dialysis, MVCAD, s/p CABG, who presented to hospital on to undergo atherectomy of severely calcifed RCA.  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 S/p elective CSI atherectomy to RCA yesterday. Pt had been on chronic plavix PTA. Currently on Cangrelor but now has Cortrak. - Stop Cangrelor and start Plavix 600 mg load then 75 mg daily.  - Can put aspirin down tube rather than pr.  - Continue statin.  2. Cardiac Arrest: Developed recurrent episodes of NSVT and required defibrillation in cath lab for VT.  Later developed VT arrest in dialysis in setting of K 7.7 and hypoMg.  Sucessufully resuscitated; started on amiodarone, lidocaine, levophed, hypothermia, intubated, sedated and paralyzed on versed, fentanyl and Nimbex.  Suspect electrolyte issues in arrest, excellent result at PCI.  No further arrhythmia.  - Stop amiodarone gtt and transition to po 400 mg bid.  3. Hypotension: Resolved, off norepinephrine.  4. Chronic systolic CHF: Ischemic cardioyopathy, EF 30-35% by echo with moderate RV dysfunction. CVP 6, getting CVVH.  5. Pulmonary: Intubated. Per nursing, he awakens with weaning of sedation.  Now on FiO2 0.3. - Hopefully can extubate today.  6. ESRD:  CVVH per nephrology.  Electrolytes stable and CVP now 6.    CRITICAL CARE Performed by: Loralie Champagne  Total critical care time: 35 minutes  Critical care time was exclusive of separately billable procedures and treating other patients.  Critical care was necessary to treat or prevent imminent or life-threatening deterioration.  Critical care was time spent personally by me on the following activities: development of treatment plan with patient and/or surrogate as well as nursing, discussions with consultants, evaluation of patient's response to treatment, examination of patient, obtaining history from patient or surrogate, ordering and performing treatments and interventions, ordering and review of laboratory studies, ordering and review of radiographic studies, pulse oximetry and re-evaluation of patient's condition.  Loralie Champagne 06/21/2018 7:33 AM

## 2018-06-21 NOTE — Progress Notes (Signed)
[facility] Progress Note  Subjective: -2.1 L yest  w CRRT, off pressors today.  CVP 5-6.    Vitals:   06/21/18 0900 06/21/18 1000 06/21/18 1100 06/21/18 1115  BP: (!) 106/25 (!) 105/32  (!) 113/43  Pulse: 83 95 (!) 106 (!) 104  Resp: 16 16 (!) 24 15  Temp:      TempSrc:      SpO2: 100% 99% 99% 100%  Weight:      Height:        Inpatient medications: . amiodarone  400 mg Per Tube BID  . aspirin  81 mg Per Tube Daily  . atorvastatin  40 mg Oral QPM  . B-complex with vitamin C  1 tablet Per Tube Daily  . chlorhexidine gluconate (MEDLINE KIT)  15 mL Mouth Rinse BID  . Chlorhexidine Gluconate Cloth  6 each Topical Daily  . Chlorhexidine Gluconate Cloth  6 each Topical Q0600  . Chlorhexidine Gluconate Cloth  6 each Topical Daily  . cinacalcet  30 mg Oral Q supper  . [START ON 06/22/2018] clopidogrel  75 mg Per Tube Daily  . doxercalciferol  1 mcg Intravenous Q T,Th,Sa-HD  . feeding supplement (PRO-STAT SUGAR FREE 64)  30 mL Per Tube TID  . ferric citrate  420 mg Oral TID WC  . heparin  5,000 Units Subcutaneous Q8H  . insulin aspart  1-3 Units Subcutaneous Q4H  . insulin detemir  5 Units Subcutaneous Q12H  . levothyroxine  125 mcg Per Tube Q0600  . mouth rinse  15 mL Mouth Rinse 10 times per day  . nystatin  5 mL Oral QID   . sodium chloride 10 mL/hr at 06/21/18 0800  . famotidine (PEPCID) IV Stopped (06/20/18 2220)  . feeding supplement (VITAL 1.5 CAL) 35 mL/hr at 06/21/18 0726   ferric citrate **AND** ferric citrate, heparin, iohexol, lidocaine-prilocaine, Melatonin, sodium chloride flush    Exam: General appearance: Intubated , opens eyes to voice Resp: rhonchi bilaterally Cardio: tachy no rub GI: soft, non-tender; bowel sounds normal; no masses,  no organomegaly Extremities: cool to touch, no edema, LUE AVF +T/B Dialysis Access: LUE AVF +T/B  CXR 6/10 - no sig edema/ infiltrates  Dialysis: [place]  TTS  4h  2/2.25 bath  63.5kg  Hep 3000  LUE AVF   350/800  P4 Hectoral 1 mcg IV/HD Micera 30 mcg IV every 6 weeks (last given 05/13/18)  Assessment/Plan: 1.  VT arrest- in setting of hyperkalemia, per cards, on IV amio 2. Resp failure - on vent, per CCM 3. Multivessel CAD- s/p intervention of 2 lesions in RCA and DES placed in prox RCA 4. ESRD - CRRT started 6/10. Will dc CRRT today. Vol excess resolved.  5.  Hypotension: off pressors 6.  Anemia  - Hb 12, no need for esa 7.  Metabolic bone disease -  stable    [facility] 06/21/2018, 11:50 AM  Iron/TIBC/Ferritin/ %Sat    Component Value Date/Time   IRON 15 (L) 10/13/2010 1500   TIBC 213 (L) 10/13/2010 1500   FERRITIN 116 10/13/2010 1500   IRONPCTSAT 7 (L) 10/13/2010 1500   Recent Labs  Lab 06/18/18 1000  06/21/18 0312 06/21/18 0326  NA  --    < > 134* 135  K  --    < > 3.7 3.8  CL  --    < > 101  --   CO2  --    < > 22  --  GLUCOSE  --    < > 162*  --   BUN  --    < > 10  --   CREATININE  --    < > 1.89*  --   CALCIUM  --    < > 9.0  --   PHOS  --    < > 3.4  --   ALBUMIN  --    < > 2.4*  --   INR 1.2  --   --   --    < > = values in this interval not displayed.   No results for input(s): AST, ALT, ALKPHOS, BILITOT, PROT in the last 168 hours. Recent Labs  Lab 06/21/18 0312 06/21/18 0326  WBC 14.0*  --   HGB 12.0* 12.2*  HCT 37.5* 36.0*  PLT 191  --

## 2018-06-22 ENCOUNTER — Inpatient Hospital Stay (HOSPITAL_COMMUNITY): Payer: Medicare Other

## 2018-06-22 LAB — POCT I-STAT 7, (LYTES, BLD GAS, ICA,H+H)
Bicarbonate: 23.9 mmol/L (ref 20.0–28.0)
Calcium, Ion: 1.29 mmol/L (ref 1.15–1.40)
HCT: 30 % — ABNORMAL LOW (ref 39.0–52.0)
Hemoglobin: 10.2 g/dL — ABNORMAL LOW (ref 13.0–17.0)
O2 Saturation: 98 %
Patient temperature: 38
Potassium: 3.7 mmol/L (ref 3.5–5.1)
Sodium: 134 mmol/L — ABNORMAL LOW (ref 135–145)
TCO2: 25 mmol/L (ref 22–32)
pCO2 arterial: 36.4 mmHg (ref 32.0–48.0)
pH, Arterial: 7.43 (ref 7.350–7.450)
pO2, Arterial: 103 mmHg (ref 83.0–108.0)

## 2018-06-22 LAB — BASIC METABOLIC PANEL
Anion gap: 12 (ref 5–15)
BUN: 49 mg/dL — ABNORMAL HIGH (ref 8–23)
CO2: 22 mmol/L (ref 22–32)
Calcium: 9.1 mg/dL (ref 8.9–10.3)
Chloride: 100 mmol/L (ref 98–111)
Creatinine, Ser: 3.24 mg/dL — ABNORMAL HIGH (ref 0.61–1.24)
GFR calc Af Amer: 20 mL/min — ABNORMAL LOW (ref >60)
GFR calc non Af Amer: 17 mL/min — ABNORMAL LOW (ref >60)
Glucose, Bld: 199 mg/dL — ABNORMAL HIGH (ref 70–99)
Potassium: 3.7 mmol/L (ref 3.5–5.1)
Sodium: 134 mmol/L — ABNORMAL LOW (ref 135–145)

## 2018-06-22 LAB — GLUCOSE, CAPILLARY
Glucose-Capillary: 144 mg/dL — ABNORMAL HIGH (ref 70–99)
Glucose-Capillary: 189 mg/dL — ABNORMAL HIGH (ref 70–99)
Glucose-Capillary: 220 mg/dL — ABNORMAL HIGH (ref 70–99)
Glucose-Capillary: 294 mg/dL — ABNORMAL HIGH (ref 70–99)
Glucose-Capillary: 302 mg/dL — ABNORMAL HIGH (ref 70–99)
Glucose-Capillary: 270 mg/dL — ABNORMAL HIGH (ref 70–99)
Glucose-Capillary: 250 mg/dL — ABNORMAL HIGH (ref 70–99)
Glucose-Capillary: 194 mg/dL — ABNORMAL HIGH (ref 70–99)

## 2018-06-22 LAB — CBC
HCT: 31.5 % — ABNORMAL LOW (ref 39.0–52.0)
Hemoglobin: 10.1 g/dL — ABNORMAL LOW (ref 13.0–17.0)
MCH: 32.7 pg (ref 26.0–34.0)
MCHC: 32.1 g/dL (ref 30.0–36.0)
MCV: 101.9 fL — ABNORMAL HIGH (ref 80.0–100.0)
Platelets: 198 10*3/uL (ref 150–400)
RBC: 3.09 MIL/uL — ABNORMAL LOW (ref 4.22–5.81)
RDW: 14.7 % (ref 11.5–15.5)
WBC: 9.8 10*3/uL (ref 4.0–10.5)
nRBC: 0 % (ref 0.0–0.2)

## 2018-06-22 LAB — COOXEMETRY PANEL
Carboxyhemoglobin: 0.9 % (ref 0.5–1.5)
Methemoglobin: 0.8 % (ref 0.0–1.5)
O2 Saturation: 61.1 %
Total hemoglobin: 10.5 g/dL — ABNORMAL LOW (ref 12.0–16.0)

## 2018-06-22 LAB — MAGNESIUM: Magnesium: 2.5 mg/dL — ABNORMAL HIGH (ref 1.7–2.4)

## 2018-06-22 LAB — PHOSPHORUS: Phosphorus: 2.6 mg/dL (ref 2.5–4.6)

## 2018-06-22 MED ORDER — INSULIN DETEMIR 100 UNIT/ML ~~LOC~~ SOLN
10.0000 [IU] | Freq: Two times a day (BID) | SUBCUTANEOUS | Status: DC
  Administered 2018-06-22 – 2018-06-23 (×2): 10 [IU] via SUBCUTANEOUS
  Filled 2018-06-22 (×4): qty 0.1

## 2018-06-22 MED ORDER — INSULIN ASPART 100 UNIT/ML ~~LOC~~ SOLN
2.0000 [IU] | SUBCUTANEOUS | Status: DC
  Administered 2018-06-22: 6 [IU] via SUBCUTANEOUS

## 2018-06-22 MED ORDER — MIDODRINE HCL 5 MG PO TABS
5.0000 mg | ORAL_TABLET | Freq: Three times a day (TID) | ORAL | Status: DC
  Administered 2018-06-22 (×2): 5 mg via ORAL
  Filled 2018-06-22 (×2): qty 1

## 2018-06-22 MED ORDER — ACETAMINOPHEN 160 MG/5ML PO SOLN
650.0000 mg | Freq: Four times a day (QID) | ORAL | Status: DC | PRN
  Administered 2018-06-22 – 2018-06-23 (×3): 650 mg via ORAL
  Filled 2018-06-22 (×3): qty 20.3

## 2018-06-22 MED ORDER — INSULIN REGULAR(HUMAN) IN NACL 100-0.9 UT/100ML-% IV SOLN
INTRAVENOUS | Status: DC
  Administered 2018-06-22: 2.4 [IU]/h via INTRAVENOUS
  Administered 2018-06-23: 12:00:00 1.9 [IU]/h via INTRAVENOUS
  Filled 2018-06-22 (×3): qty 100

## 2018-06-22 MED ORDER — NOREPINEPHRINE 4 MG/250ML-% IV SOLN
0.0000 ug/min | INTRAVENOUS | Status: DC
  Administered 2018-06-22: 9 ug/min via INTRAVENOUS
  Administered 2018-06-22: 2 ug/min via INTRAVENOUS
  Administered 2018-06-23: 35 ug/min via INTRAVENOUS
  Administered 2018-06-23: 30 ug/min via INTRAVENOUS
  Administered 2018-06-23: 25 ug/min via INTRAVENOUS
  Filled 2018-06-22 (×4): qty 250

## 2018-06-22 NOTE — Progress Notes (Signed)
[facility] Progress Note  Subjective: fevers last night, waking up now  Vitals:   06/22/18 0700 06/22/18 0725 06/22/18 0800 06/22/18 0900  BP: (!) 91/33 (!) 92/43 (!) 70/27 (!) 89/61  Pulse: (!) 110 (!) 105 (!) 109 (!) 110  Resp: 17 15 (!) 22 (!) 26  Temp:   (!) 100.6 F (38.1 C)   TempSrc:   Bladder   SpO2: 100% 100% 100% 100%  Weight:      Height:        Inpatient medications: . amiodarone  400 mg Per Tube BID  . aspirin  81 mg Per Tube Daily  . atorvastatin  40 mg Oral QPM  . B-complex with vitamin C  1 tablet Per Tube Daily  . chlorhexidine gluconate (MEDLINE KIT)  15 mL Mouth Rinse BID  . Chlorhexidine Gluconate Cloth  6 each Topical Daily  . Chlorhexidine Gluconate Cloth  6 each Topical Q0600  . Chlorhexidine Gluconate Cloth  6 each Topical Daily  . cinacalcet  30 mg Oral Q supper  . clopidogrel  75 mg Per Tube Daily  . doxercalciferol  1 mcg Intravenous Q T,Th,Sa-HD  . feeding supplement (PRO-STAT SUGAR FREE 64)  30 mL Per Tube TID  . ferric citrate  420 mg Oral TID WC  . heparin  5,000 Units Subcutaneous Q8H  . insulin aspart  2-6 Units Subcutaneous Q4H  . insulin detemir  10 Units Subcutaneous Q12H  . levothyroxine  125 mcg Per Tube Q0600  . mouth rinse  15 mL Mouth Rinse 10 times per day  . midodrine  5 mg Oral TID WC  . nystatin  5 mL Oral QID   . sodium chloride 10 mL/hr at 06/22/18 0800  . famotidine (PEPCID) IV Stopped (06/21/18 2209)  . feeding supplement (VITAL 1.5 CAL) 1,000 mL (06/21/18 1738)  . norepinephrine (LEVOPHED) Adult infusion 4 mcg/min (06/22/18 1027)   ferric citrate **AND** ferric citrate, heparin, iohexol, lidocaine-prilocaine, Melatonin, sodium chloride flush    Exam: General appearance: Intubated , opens eyes to voice + temp cath  Resp: cta bilat Cardio: tachy no rub GI: soft, non-tender; bowel sounds normal; no masses,  no organomegaly Extremities: no sig edema Dialysis Access: LUE AVF +T/B  CXR 6/10 - no sig  edema/ infiltrates  Dialysis: [place]  TTS  4h  2/2.25 bath  63.5kg  Hep 3000  LUE AVF  350/800  P4 Hectoral 1 mcg IV/HD Micera 30 mcg IV every 6 weeks (last given 05/13/18)  Assessment/Plan: 1.  VT arrest- after PCI, in setting of hyperkalemia, per cards, on amio 2. Resp failure - on vent, per CCM 3. Fevers: new, will dc temp HD cath 4. Multivessel CAD- s/p intervention of 2 lesions in RCA and DES placed in prox RCA 5. ESRD - sp CRRT 6/10- 6/13. DC temp cath today. Labs good, next HD 6/16 using AVF.  Is just under dry wt today, no vol^ on exam 6.  Hypotension: off pressors 7.  Anemia  - Hb > 10 no need for esa    [facility] 06/22/2018, 10:37 AM  Iron/TIBC/Ferritin/ %Sat    Component Value Date/Time   IRON 15 (L) 10/13/2010 1500   TIBC 213 (L) 10/13/2010 1500   FERRITIN 116 10/13/2010 1500   IRONPCTSAT 7 (L) 10/13/2010 1500   Recent Labs  Lab 06/18/18 1000  06/21/18 0312  06/22/18 0317 06/22/18 0324  NA  --    < > 134*   < > 134*  134*  K  --    < > 3.7   < > 3.7 3.7  CL  --    < > 101  --  100  --   CO2  --    < > 22  --  22  --   GLUCOSE  --    < > 162*  --  199*  --   BUN  --    < > 10  --  49*  --   CREATININE  --    < > 1.89*  --  3.24*  --   CALCIUM  --    < > 9.0  --  9.1  --   PHOS  --    < > 3.4  --  2.6  --   ALBUMIN  --    < > 2.4*  --   --   --   INR 1.2  --   --   --   --   --    < > = values in this interval not displayed.   No results for input(s): AST, ALT, ALKPHOS, BILITOT, PROT in the last 168 hours. Recent Labs  Lab 06/22/18 0317 06/22/18 0324  WBC 9.8  --   HGB 10.1* 10.2*  HCT 31.5* 30.0*  PLT 198  --

## 2018-06-22 NOTE — Progress Notes (Signed)
Patient ID: LLIAM HOH, male   DOB: 05-02-1940, 78 y.o.   MRN: [id number]   Progress Note  Patient Name: [name] [name] Date of Encounter: 06/22/2018  Primary Cardiologist: Dr. Ellyn Hack  Subjective   Intubated/sedated but will awaken and follows some commands.  CVVHD stopped 6/13. Back on NE at 2. Cangrelor stopped 6/13 now on Plavix.  Weaning from vent this am. He is febrile to 100.9. HR consistently increasing since weaning attempt now sinus 110-115. CVP 12   Echo: EF 30-35%, akinetic apex, moderately decreased RV systolic function.   Inpatient Medications    Scheduled Meds: . amiodarone  400 mg Per Tube BID  . aspirin  81 mg Per Tube Daily  . atorvastatin  40 mg Oral QPM  . B-complex with vitamin C  1 tablet Per Tube Daily  . chlorhexidine gluconate (MEDLINE KIT)  15 mL Mouth Rinse BID  . Chlorhexidine Gluconate Cloth  6 each Topical Daily  . Chlorhexidine Gluconate Cloth  6 each Topical Q0600  . Chlorhexidine Gluconate Cloth  6 each Topical Daily  . cinacalcet  30 mg Oral Q supper  . clopidogrel  75 mg Per Tube Daily  . doxercalciferol  1 mcg Intravenous Q T,Th,Sa-HD  . feeding supplement (PRO-STAT SUGAR FREE 64)  30 mL Per Tube TID  . ferric citrate  420 mg Oral TID WC  . heparin  5,000 Units Subcutaneous Q8H  . insulin aspart  2-6 Units Subcutaneous Q4H  . insulin detemir  10 Units Subcutaneous Q12H  . levothyroxine  125 mcg Per Tube Q0600  . mouth rinse  15 mL Mouth Rinse 10 times per day  . nystatin  5 mL Oral QID   Continuous Infusions: . sodium chloride 10 mL/hr at 06/22/18 0800  . famotidine (PEPCID) IV Stopped (06/21/18 2209)  . feeding supplement (VITAL 1.5 CAL) 1,000 mL (06/21/18 1738)  . norepinephrine (LEVOPHED) Adult infusion 2 mcg/min (06/22/18 0907)   PRN Meds: ferric citrate **AND** ferric citrate, heparin, iohexol, lidocaine-prilocaine, Melatonin, sodium chloride flush   Vital Signs    Vitals:   06/22/18 0700 06/22/18 0725 06/22/18 0800  06/22/18 0900  BP: (!) 91/33 (!) 92/43 (!) 70/27 (!) 89/61  Pulse: (!) 110 (!) 105 (!) 109 (!) 110  Resp: 17 15 (!) 22 (!) 26  Temp:   (!) 100.6 F (38.1 C)   TempSrc:   Bladder   SpO2: 100% 100% 100% 100%  Weight:      Height:        Intake/Output Summary (Last 24 hours) at 06/22/2018 0935 Last data filed at 06/22/2018 0800 Gross per 24 hour  Intake 1345.21 ml  Output 10 ml  Net 1335.21 ml    I/O since admission: -[place] Weights   06/19/18 0257 06/20/18 0315 06/21/18 0545  Weight: 71.4 kg 67.5 kg 62.2 kg    Telemetry    Sinus tach 110-115 (steady increase since start of wean) - Personally Reviewed  Physical Exam   BP (!) 89/61 (BP Location: Right Arm)   Pulse (!) 110   Temp (!) 100.6 F (38.1 C) (Bladder)   Resp (!) 26   Ht '5\' 5"'$  (1.651 m)   Wt 62.2 kg   SpO2 100%   BMI 22.82 kg/m  General: Elderly on vent. Will awaken and follow some commands HEENT: normal + ETT Neck: supple. RIJ trialysis. Carotids 2+ bilat; no bruits. No lymphadenopathy or thryomegaly appreciated. Cor: PMI nondisplaced. Tachy regular  Lungs: clear Abdomen: soft, nontender, nondistended.  No hepatosplenomegaly. No bruits or masses. Good bowel sounds. Extremities: no cyanosis, clubbing, rash, edema + boots Neuro: awake on vent will follow some commands   Labs    Chemistry Recent Labs  Lab 06/20/18 0505 06/20/18 1600 06/21/18 0312 06/21/18 0326 06/22/18 0317 06/22/18 0324  NA 133* 136 134* 135 134* 134*  K 4.5 3.9 3.7 3.8 3.7 3.7  CL 100 102 101  --  100  --   CO2 21* 23 22  --  22  --   GLUCOSE 226* 129* 162*  --  199*  --   BUN '14 12 10  '$ --  49*  --   CREATININE 2.59* 2.18* 1.89*  --  3.24*  --   CALCIUM 8.5* 8.9 9.0  --  9.1  --   ALBUMIN 2.5* 2.5* 2.4*  --   --   --   GFRNONAA 23* 28* 33*  --  17*  --   GFRAA 27* 33* 39*  --  20*  --   ANIONGAP '12 11 11  '$ --  12  --      Hematology Recent Labs  Lab 06/20/18 0505 06/21/18 0312 06/21/18 0326 06/22/18 0317 06/22/18  0324  WBC 17.7* 14.0*  --  9.8  --   RBC 3.61* 3.69*  --  3.09*  --   HGB 11.8* 12.0* 12.2* 10.1* 10.2*  HCT 37.0* 37.5* 36.0* 31.5* 30.0*  MCV 102.5* 101.6*  --  101.9*  --   MCH 32.7 32.5  --  32.7  --   MCHC 31.9 32.0  --  32.1  --   RDW 14.6 14.6  --  14.7  --   PLT 212 191  --  198  --     Cardiac Enzymes Recent Labs  Lab 07/05/2018 2014  TROPONINI 0.52*   No results for input(s): TROPIPOC in the last 168 hours.   BNPNo results for input(s): BNP, PROBNP in the last 168 hours.   DDimer No results for input(s): DDIMER in the last 168 hours.   Lipid Panel     Component Value Date/Time   CHOL 145 04/11/2018 0930   CHOL 162 06/28/2016 1427   TRIG 184.0 (H) 04/11/2018 0930   HDL 24.10 (L) 04/11/2018 0930   HDL 36 (L) 06/28/2016 1427   CHOLHDL 6 04/11/2018 0930   VLDL 36.8 04/11/2018 0930   LDLCALC 84 04/11/2018 0930   LDLCALC 97 06/28/2016 1427   LDLDIRECT 154.0 04/22/2017 0954     Radiology    Dg Chest Port 1 View  Result Date: 06/22/2018 CLINICAL DATA:  Intubated. EXAM: PORTABLE CHEST 1 VIEW COMPARISON:  06/21/2018 FINDINGS: Endotracheal tube terminates at the carina. Enteric tube courses into the abdomen with tip not imaged. Right jugular and left subclavian catheters are unchanged. Sequelae of CABG are again identified. The cardiomediastinal silhouette is unchanged with normal heart size. Bibasilar and perihilar opacities are stable to mildly improved. No sizable pleural effusion or pneumothorax is identified. IMPRESSION: 1. Endotracheal tube at the carina. Recommend retracting 3 cm. 2. Stable to mildly improvement of bibasilar and perihilar opacities. Electronically Signed   By: Logan Bores M.D.   On: 06/22/2018 08:14   Dg Chest Port 1 View  Result Date: 06/21/2018 CLINICAL DATA:  Endotracheal tube. EXAM: PORTABLE CHEST 1 VIEW COMPARISON:  06/20/2018 FINDINGS: Endotracheal tube terminates 1 cm above the carina. Right jugular and left subclavian catheters are  unchanged. Enteric tube courses into the abdomen with tip not imaged. Sequelae of CABG are  again identified. The cardiac silhouette is normal in size. Lung volumes are slightly greater than on the prior study with mildly improved aeration of the lung bases. Airspace opacity persists in the medial lung bases bilaterally and in the right perihilar region. No large pleural effusion or pneumothorax is identified. IMPRESSION: 1. Endotracheal tube terminates 1 cm above the carina. Consider retracting 1-2 cm. 2. Mildly improved aeration of the lung bases. Persistent bibasilar opacities may reflect atelectasis or infection. Electronically Signed   By: Logan Bores M.D.   On: 06/21/2018 09:54   Dg Abd Portable 1v  Result Date: 06/20/2018 CLINICAL DATA:  Encounter for feeding tube placement. EXAM: PORTABLE ABDOMEN - 1 VIEW COMPARISON:  Radiograph of November 20, 2006. FINDINGS: The bowel gas pattern is normal. Distal tip of feeding tube is seen in proximal stomach. No radio-opaque calculi or other significant radiographic abnormality are seen. IMPRESSION: Distal tip of feeding tube seen in proximal stomach. Electronically Signed   By: Marijo Conception M.D.   On: 06/20/2018 15:29    Cardiac Studies    CULPRIT LESION: Ost RCA to Prox RCA lesion is 90% stenosed. Prox RCA lesion is 45% stenosed. -Moderate-severely calcified  A drug-eluting stent was successfully placed using a STENT RESOLUTE SFKC1.2X51. Postdilated in tapered fashion from 3.7 down to 3.2 mm  Post intervention, there is a 0% residual stenosis.  ------------------------------------------------------------  Mid RCA-2 overlapping stent segment is ~ 65% stenosed.  Scoring balloon angioplasty was performed using a BALLOON WOLVERINE 3.00X6.  Post intervention, there is a 15% residual stenosis.  ---------------------------------------------------------------------  Dist RCA lesion is 45% stenosed. Post Atrio lesion is 100% stenosed.   SUMMARY   Successful orbital atherectomy based DES PCI of ostial-proximal RCA (Resolute Onyx DES 3.0 mm x 38 mm --> 3.7-3.2 mm taper post dilation)  Successful Scoring Balloon angioplasty and post dilation (with 3.5 mm Be[place]ley balloon) of mid RCA ISR  Intermittent pacing with Temporary Pacemaker-removed post PCI  Successful defibrillation of Ventricular Tachycardia-1 shock 200 J   RECOMMENDATIONS  Overnight observation post PCI with sheath removal in PACU holding area  Continue home medications as prescribed  He will need dialysis today and anticipate discharge tomorrow  He already has scheduled follow-up    Intervention      Patient Profile     78 y.o. male with ESRD on dialysis, MVCAD, s/p CABG, who presented to hospital on to undergo atherectomy of severely calcifed RCA.  Assessment & Plan    1. CAD: severe MVCAD as noted above. S/p elective CSI atherectomy to RCA yesterday. Pt had been on chronic plavix PTA. Currently on Cangrelor but now has Cortrak. - Cangrelor stopped and Plavix 600 mg loaded 6/13. Now on Plavix75 mg daily.  - Continue ASA and statin  - No b-blocker yet with need for NE. No ACE/ARB with ESRD  2. Cardiac Arrest: Developed recurrent episodes of NSVT and required defibrillation in cath lab for VT.  Later developed VT arrest in dialysis in setting of K 7.7 and hypoMg.  Sucessufully resuscitated; started on amiodarone, lidocaine, levophed, hypothermia, intubated, sedated and paralyzed on versed, fentanyl and Nimbex.  Suspect electrolyte issues in arrest, excellent result at PCI.  No further arrhythmia.  - On po amio. Rhythm stable - Keep K> 4.0 Mg > 2.0  3. Hypotension:  - back on low-dose NE. Will add midodrine 5 tid  4. Chronic systolic CHF: Ischemic cardioyopathy, EF 30-35% by echo with moderate RV dysfunction.  - CVP 12 - volume management per  Renal. CVVHD stopped 6/13 - resume iHD as needed (has chronic ESRD) - Check co-ox  5. Pulmonary: Intubated. Per  nursing, he awakens with weaning of sedation.  Now on FiO2 0.3. - Hopefully can extubate soon but increasing tachycardia and low-grade temps concern me  6. ESRD: - volume management per Nephrology with CVVHD vs iHD  7. Fevers - no obvious infiltrate on CXR (Personally reviewed) - will check BCx x 2 - low-threshold for empiric coverage given worsening tachycardia and recurrent need for pressors - remove central lines as soon as possible    CRITICAL CARE Performed by: Glori Bickers  Total critical care time: 35 minutes  Critical care time was exclusive of separately billable procedures and treating other patients.  Critical care was necessary to treat or prevent imminent or life-threatening deterioration.  Critical care was time spent personally by me on the following activities: development of treatment plan with patient and/or surrogate as well as nursing, discussions with consultants, evaluation of patient's response to treatment, examination of patient, obtaining history from patient or surrogate, ordering and performing treatments and interventions, ordering and review of laboratory studies, ordering and review of radiographic studies, pulse oximetry and re-evaluation of patient's condition.  Quillian Quince Michelle Wnek 06/22/2018 9:35 AM

## 2018-06-22 NOTE — Progress Notes (Signed)
NAME:  MAXAMILIAN AMADON, MRN:  [id number], DOB:  1940-01-24, LOS: 4 ADMISSION DATE:  06/16/2018, CONSULTATION DATE:  06/16/2018 REFERRING MD:  Code team, CHIEF COMPLAINT:  Cardiac arrest  Brief History   78 year old male admitted for progressive angina s/p successful balloon angioplasty of the mid RCA and atherectomy based DES PCI of the ostial-proximal RCA 6/9 am.  Had ongoing VT throughout the day attributed to hypokalemia and hypomagnesemia.  Prior to [facility], he was noted to have K 7.4.  Had VT cardiac arrest shortly after starting iHD with ROSC after 20 mins.  Intubated and transferred to ICU, remains unresponsive.    History of present illness   HPI obtained from medical chart review as patient is intubated and sedated on mechanical ventilation.    78 year old male with complex history of extensive CAD s/p CABG with multiple PCI and PTCA interventions on the RCA for in-stent restenosis, Ischemic cardiomyopathy, ERSD on HD, DMT2, HTN, HLD, PUD, anemia, hypothyroidism, stroke, colon cancer s/p resection admitted 6/9 with progressive angina.   He had reported he was taking up to 2 bottles of nitroglycerin a month with angina at rest and waking him up at night with some ongoing hypotension on his delayed follow-up cardiology appointment on 5/28.  He underwent cardiac catheterization on 6/5 which showed modest progression of his mid RCA in-stent restenosis but new de novo lesion in the proximal RCA that would require staged atherectomy based PCI.  Therefore he was admitted 6/9 and underwent successful balloon angioplasty of the mid RCA and atherectomy based DES PCI of the ostial-proximal RCA; he required defibrillation x 1 during case for ventricular tachycardia.  Throughout the day he had intermittent episode of asymptomatic VT and noted to have hypokalemia and hypomagnesemia which were replaced and he was started on amiodarone drip.  He had also complained of ongoing chest pain without relief after sublingual  nitro. He was noted to have K 7.2 prior to dialysis.  He was started on iHD and after 7 minutes decompensated to VT cardiac arrest with ROSC obtained after 20 mins with ACLS measures including temporizing measures for hyperkalemia, lidocaine, amiodarone, and defibrillation x 2.  He was intubated by RT.  Post arrest, transferred to ICU.  He remained unresponsive and normotensive in SR.  PCCM consulted for further medical and ventilator management.  Nephrology has asked for hemodialysis catheter to initiate CRRT.   Past Medical History  Extensive CAD s/p CABG with multiple PCI and PTCA interventions on the RCA for in-stent restenosis, Ischemic cardiomyopathy, ERSD on HD, DMT2, HTN, HLD, PUD, anemia, hypothyroidism, stroke, colon cancer s/p resection  Significant Hospital Events   6/9 Admit/ cardiac arrest  Consults:  Cardiology Nephrology  Procedures:  6/9 ETT>>> 6/9 R IJ HD cath>>>  Significant Diagnostic Tests:  Cardiac Cath June 13, 2018:  Severe multivessel native CAD with CTO of ostial LCx that has 3 major OM branches all occluded, LAD after diffusely diseased D1, and new ostial RCA disease with moderate in-stent restenosis of the mid RCA.  Patent LIMA-LAD and SVG-OM 2 with occluded side branch of OM 2 and essentially subtotal occlusion of OM 2 that is not PCI amenable   Despite significant CAD, relatively preserved LVEF and EDP  6/9 LHC  >>  CULPRIT LESION: Ost RCA to Prox RCA lesion is 90% stenosed. Prox RCA lesion is 45% stenosed. -Moderate-severely calcified  A drug-eluting stent was successfully placed using a STENT RESOLUTE [id number]. Postdilated in tapered fashion from 3.7  down to 3.2 mm  Post intervention, there is a 0% residual stenosis.  ------------------------------------------------------------  Mid RCA-2 overlapping stent segment is ~ 65% stenosed.  Scoring balloon angioplasty was performed using a BALLOON WOLVERINE 3.00X6.  Post intervention, there is a 15%  residual stenosis.  ---------------------------------------------------------------------  Dist RCA lesion is 45% stenosed. Post Atrio lesion is 100% stenosed.  Micro Data:   Antimicrobials:   Interim history/subjective:   Some agitation overnight, off pressors but sedate  Objective   Blood pressure (!) 92/43, pulse (!) 105, temperature (!) 100.6 F (38.1 C), resp. rate 15, height 5\' 5"  (1.651 m), weight 62.2 kg, SpO2 100 %. CVP:  [1 mmHg-10 mmHg] 10 mmHg  Vent Mode: PSV;CPAP FiO2 (%):  [30 %] 30 % Set Rate:  [16 bmp] 16 bmp Vt Set:  [500 mL] 500 mL PEEP:  [5 cmH20] 5 cmH20 Pressure Support:  [10 cmH20] 10 cmH20 Plateau Pressure:  [15 cmH20-18 cmH20] 15 cmH20   Intake/Output Summary (Last 24 hours) at 06/22/2018 0811 Last data filed at 06/22/2018 0700 Gross per 24 hour  Intake 1343.53 ml  Output 25 ml  Net 1318.53 ml   Filed Weights   06/19/18 0257 06/20/18 0315 06/21/18 0545  Weight: 71.4 kg 67.5 kg 62.2 kg   Examination: General:  Acute on chronically ill appearing male, NAD HEENT: [place]/AT, PERRL, EOM-I and MMM Neuro: Opens eyes to voice but not command, withdraws to pain diffusely CV: RRR, Nl S1/S2 and -M/R/G PULM: Coarse BS diffusely GI: Soft, NT, ND and +BS Extremities: -edema and -tenderness Skin: no obvious rash   I reviewed CXR myself, ETT is in a good position  Resolved Hospital Problem list    Assessment & Plan:   VT cardiac arrest s/p recurrent VT w/ electrolyte imbalances Progressive Angina w/ Extensive CAD s/p prior CABG with multiple PCI and PTCA interventions on the RCA for in-stent restenosis s/p 6/9 Balloon angioplasty and atherectomy based DES PCI of the ostial-proximal RCA Ischemic cardiomyopathy  P:  Tele monitoring Levophed for MAP of 65 Amio off Follow electrolytes and replete K goal 4, mag goal 2  TTE per cards Cortrak in place PO ASA  Acute encephalopathy post cardiac arrest w/ ROSC after 20 mins P:  TTM protocol in place, 33 deg,  completed D/C all sedation including PRN Hold off EEG and CT for now since he is slowly improving May need to consider a neuro consult if no improvement D/C all sedation  Acute respiratory insufficieny in the setting of cardiac arrest  P:  Maintain on PS, extubate when mental status is improved Hold off extubation given mental status Maintain sats >90%  Wean FiO2 and PEEP for sat of 88-92%  ESRD on iHD P:  Catheter in place  CRRT off, transition to iHD per renal BMET in AM Replace electrolytes as needed  DM w/hyperglycemia P:  CBG ISS  Hx HTN P:  Holding imdur and metoprolol   HLD P:  Continue statin   Reported dysphagia by family P:  TF through cortrak    Best practice:  Diet: NPO Pain/Anxiety/Delirium protocol (if indicated): fentanyl/ versed/ nimbex VAP protocol (if indicated): yes DVT prophylaxis: heparin SQ GI prophylaxis: pepcid Glucose control: insulin gtt per protocol Mobility: BR Code Status: Full  Family Communication: per primary  Disposition:  ICU  Labs   CBC: Recent Labs  Lab 06/18/18 0450 06/19/18 0455  06/20/18 0505 06/21/18 0312 06/21/18 0326 06/22/18 0317 06/22/18 0324  WBC 20.0* 19.5*  --  17.7* 14.0*  --  9.8  --   HGB 11.3* 12.4*   < > 11.8* 12.0* 12.2* 10.1* 10.2*  HCT 34.1* 37.9*   < > 37.0* 37.5* 36.0* 31.5* 30.0*  MCV 100.3* 98.4  --  102.5* 101.6*  --  101.9*  --   PLT 260 269  --  212 191  --  198  --    < > = values in this interval not displayed.   Basic Metabolic Panel: Recent Labs  Lab 06/18/18 0450  06/19/18 0454  06/20/18 0351  06/20/18 0505 06/20/18 1600 06/21/18 0312 06/21/18 0326 06/22/18 0317 06/22/18 0324  NA 133*   < > 136   < > 140   < > 133* 136 134* 135 134* 134*  K 5.3*   < > 6.1*   < > 2.8*   < > 4.5 3.9 3.7 3.8 3.7 3.7  CL 95*   < > 102   < > 119*  --  100 102 101  --  100  --   CO2 21*   < > 22   < > 16*  --  21* 23 22  --  22  --   GLUCOSE 235*   < > 130*   < > 99  --  226* 129* 162*  --   199*  --   BUN 61*   < > 33*   < > 11  --  14 12 10   --  49*  --   CREATININE 7.68*   < > 4.36*   < > 1.52*  --  2.59* 2.18* 1.89*  --  3.24*  --   CALCIUM 8.6*   < > 8.4*   < > 5.2*  --  8.5* 8.9 9.0  --  9.1  --   MG 3.2*  --  2.5*  --   --   --  2.4  --  2.6*  --  2.5*  --   PHOS 2.7   < > 2.5   < > 2.3*  --  3.9 3.4 3.4  --  2.6  --    < > = values in this interval not displayed.   GFR: Estimated Creatinine Clearance: 16.6 mL/min (A) (by C-G formula based on SCr of 3.24 mg/dL (H)). Recent Labs  Lab 06/19/18 0455 06/20/18 0505 06/21/18 0312 06/22/18 0317  WBC 19.5* 17.7* 14.0* 9.8   Liver Function Tests: Recent Labs  Lab 06/19/18 1547 06/20/18 0351 06/20/18 0505 06/20/18 1600 06/21/18 0312  ALBUMIN 2.6* 1.5* 2.5* 2.5* 2.4*   No results for input(s): LIPASE, AMYLASE in the last 168 hours. No results for input(s): AMMONIA in the last 168 hours.  ABG    Component Value Date/Time   PHART 7.430 06/22/2018 0324   PCO2ART 36.4 06/22/2018 0324   PO2ART 103.0 06/22/2018 0324   HCO3 23.9 06/22/2018 0324   TCO2 25 06/22/2018 0324   ACIDBASEDEF 1.0 06/20/2018 0428   O2SAT 98.0 06/22/2018 0324   Coagulation Profile: Recent Labs  Lab 06/18/18 0206 06/18/18 1000  INR 1.0 1.2   Cardiac Enzymes: Recent Labs  Lab 07/08/2018 2014  TROPONINI 0.52*   HbA1C: Hgb A1c MFr Bld  Date/Time Value Ref Range Status  04/11/2018 09:30 AM 5.4 4.6 - 6.5 % Final    Comment:    Glycemic Control Guidelines for People with Diabetes:Non Diabetic:  <6%Goal of Therapy: <7%Additional Action Suggested:  >8%   10/21/2017 09:25 AM 5.5 4.6 - 6.5 % Final    Comment:  Glycemic Control Guidelines for People with Diabetes:Non Diabetic:  <6%Goal of Therapy: <7%Additional Action Suggested:  >8%    CBG: Recent Labs  Lab 06/21/18 1532 06/21/18 1923 06/21/18 2307 06/22/18 0335 06/22/18 0742  GLUCAP 155* 144* 170* 189* 220*   The patient is critically ill with multiple organ systems failure  and requires high complexity decision making for assessment and support, frequent evaluation and titration of therapies, application of advanced monitoring technologies and extensive interpretation of multiple databases.   Critical Care Time devoted to patient care services described in this note is  32  Minutes. This time reflects time of care of this signee Dr Jennet Maduro. This critical care time does not reflect procedure time, or teaching time or supervisory time of PA/NP/Med student/Med Resident etc but could involve care discussion time.  Rush Farmer, M.D. [facility] Pulmonary/Critical Care Medicine. Pager: [phone]. After hours pager: [phone].

## 2018-06-23 ENCOUNTER — Inpatient Hospital Stay (HOSPITAL_COMMUNITY): Payer: Medicare Other

## 2018-06-23 ENCOUNTER — Other Ambulatory Visit: Payer: Self-pay

## 2018-06-23 DIAGNOSIS — R5081 Fever presenting with conditions classified elsewhere: Secondary | ICD-10-CM

## 2018-06-23 DIAGNOSIS — K7200 Acute and subacute hepatic failure without coma: Secondary | ICD-10-CM | POA: Diagnosis not present

## 2018-06-23 DIAGNOSIS — R509 Fever, unspecified: Secondary | ICD-10-CM | POA: Diagnosis not present

## 2018-06-23 DIAGNOSIS — G9341 Metabolic encephalopathy: Secondary | ICD-10-CM | POA: Diagnosis not present

## 2018-06-23 DIAGNOSIS — I5041 Acute combined systolic (congestive) and diastolic (congestive) heart failure: Secondary | ICD-10-CM | POA: Diagnosis not present

## 2018-06-23 DIAGNOSIS — R570 Cardiogenic shock: Secondary | ICD-10-CM | POA: Diagnosis not present

## 2018-06-23 LAB — POCT I-STAT 7, (LYTES, BLD GAS, ICA,H+H)
Acid-base deficit: 16 mmol/L — ABNORMAL HIGH (ref 0.0–2.0)
Acid-base deficit: 7 mmol/L — ABNORMAL HIGH (ref 0.0–2.0)
Bicarbonate: 8.2 mmol/L — ABNORMAL LOW (ref 20.0–28.0)
Bicarbonate: 16.7 mmol/L — ABNORMAL LOW (ref 20.0–28.0)
Calcium, Ion: 0.62 mmol/L — CL (ref 1.15–1.40)
Calcium, Ion: 1.32 mmol/L (ref 1.15–1.40)
HCT: 39 % (ref 39.0–52.0)
HCT: 30 % — ABNORMAL LOW (ref 39.0–52.0)
Hemoglobin: 13.3 g/dL (ref 13.0–17.0)
Hemoglobin: 10.2 g/dL — ABNORMAL LOW (ref 13.0–17.0)
O2 Saturation: 99 %
O2 Saturation: 95 %
Patient temperature: 37.2
Patient temperature: 37.9
Potassium: <2 mmol/L — CL (ref 3.5–5.1)
Potassium: 4.2 mmol/L (ref 3.5–5.1)
Sodium: 151 mmol/L — ABNORMAL HIGH (ref 135–145)
Sodium: 134 mmol/L — ABNORMAL LOW (ref 135–145)
TCO2: 9 mmol/L — ABNORMAL LOW (ref 22–32)
TCO2: 17 mmol/L — ABNORMAL LOW (ref 22–32)
pCO2 arterial: 16.4 mmHg — CL (ref 32.0–48.0)
pCO2 arterial: 26.6 mmHg — ABNORMAL LOW (ref 32.0–48.0)
pH, Arterial: 7.306 — ABNORMAL LOW (ref 7.350–7.450)
pH, Arterial: 7.408 (ref 7.350–7.450)
pO2, Arterial: 137 mmHg — ABNORMAL HIGH (ref 83.0–108.0)
pO2, Arterial: 79 mmHg — ABNORMAL LOW (ref 83.0–108.0)

## 2018-06-23 LAB — LACTIC ACID, PLASMA
Lactic Acid, Venous: 4.2 mmol/L (ref 0.5–1.9)
Lactic Acid, Venous: 7 mmol/L (ref 0.5–1.9)

## 2018-06-23 LAB — GLUCOSE, CAPILLARY
Glucose-Capillary: 128 mg/dL — ABNORMAL HIGH (ref 70–99)
Glucose-Capillary: 130 mg/dL — ABNORMAL HIGH (ref 70–99)
Glucose-Capillary: 134 mg/dL — ABNORMAL HIGH (ref 70–99)
Glucose-Capillary: 135 mg/dL — ABNORMAL HIGH (ref 70–99)
Glucose-Capillary: 136 mg/dL — ABNORMAL HIGH (ref 70–99)
Glucose-Capillary: 142 mg/dL — ABNORMAL HIGH (ref 70–99)
Glucose-Capillary: 143 mg/dL — ABNORMAL HIGH (ref 70–99)
Glucose-Capillary: 143 mg/dL — ABNORMAL HIGH (ref 70–99)
Glucose-Capillary: 144 mg/dL — ABNORMAL HIGH (ref 70–99)
Glucose-Capillary: 155 mg/dL — ABNORMAL HIGH (ref 70–99)
Glucose-Capillary: 159 mg/dL — ABNORMAL HIGH (ref 70–99)
Glucose-Capillary: 160 mg/dL — ABNORMAL HIGH (ref 70–99)
Glucose-Capillary: 163 mg/dL — ABNORMAL HIGH (ref 70–99)
Glucose-Capillary: 164 mg/dL — ABNORMAL HIGH (ref 70–99)
Glucose-Capillary: 164 mg/dL — ABNORMAL HIGH (ref 70–99)
Glucose-Capillary: 169 mg/dL — ABNORMAL HIGH (ref 70–99)
Glucose-Capillary: 171 mg/dL — ABNORMAL HIGH (ref 70–99)
Glucose-Capillary: 175 mg/dL — ABNORMAL HIGH (ref 70–99)
Glucose-Capillary: 175 mg/dL — ABNORMAL HIGH (ref 70–99)
Glucose-Capillary: 179 mg/dL — ABNORMAL HIGH (ref 70–99)
Glucose-Capillary: 179 mg/dL — ABNORMAL HIGH (ref 70–99)
Glucose-Capillary: 181 mg/dL — ABNORMAL HIGH (ref 70–99)
Glucose-Capillary: 197 mg/dL — ABNORMAL HIGH (ref 70–99)
Glucose-Capillary: 198 mg/dL — ABNORMAL HIGH (ref 70–99)
Glucose-Capillary: 203 mg/dL — ABNORMAL HIGH (ref 70–99)

## 2018-06-23 LAB — HEPATIC FUNCTION PANEL
ALT: 1908 U/L — ABNORMAL HIGH (ref 0–44)
AST: 2700 U/L — ABNORMAL HIGH (ref 15–41)
Albumin: 2 g/dL — ABNORMAL LOW (ref 3.5–5.0)
Alkaline Phosphatase: 121 U/L (ref 38–126)
Bilirubin, Direct: 0.2 mg/dL (ref 0.0–0.2)
Indirect Bilirubin: 0.5 mg/dL (ref 0.3–0.9)
Total Bilirubin: 0.7 mg/dL (ref 0.3–1.2)
Total Protein: 5.4 g/dL — ABNORMAL LOW (ref 6.5–8.1)

## 2018-06-23 LAB — AMMONIA: Ammonia: 141 umol/L — ABNORMAL HIGH (ref 9–35)

## 2018-06-23 LAB — BASIC METABOLIC PANEL
Anion gap: 17 — ABNORMAL HIGH (ref 5–15)
BUN: 91 mg/dL — ABNORMAL HIGH (ref 8–23)
CO2: 16 mmol/L — ABNORMAL LOW (ref 22–32)
Calcium: 9.6 mg/dL (ref 8.9–10.3)
Chloride: 102 mmol/L (ref 98–111)
Creatinine, Ser: 4.78 mg/dL — ABNORMAL HIGH (ref 0.61–1.24)
GFR calc Af Amer: 13 mL/min — ABNORMAL LOW (ref >60)
GFR calc non Af Amer: 11 mL/min — ABNORMAL LOW (ref >60)
Glucose, Bld: 152 mg/dL — ABNORMAL HIGH (ref 70–99)
Potassium: 4.3 mmol/L (ref 3.5–5.1)
Sodium: 135 mmol/L (ref 135–145)

## 2018-06-23 LAB — CBC
HCT: 32.1 % — ABNORMAL LOW (ref 39.0–52.0)
Hemoglobin: 10.5 g/dL — ABNORMAL LOW (ref 13.0–17.0)
MCH: 33 pg (ref 26.0–34.0)
MCHC: 32.7 g/dL (ref 30.0–36.0)
MCV: 100.9 fL — ABNORMAL HIGH (ref 80.0–100.0)
Platelets: 220 10*3/uL (ref 150–400)
RBC: 3.18 MIL/uL — ABNORMAL LOW (ref 4.22–5.81)
RDW: 14.6 % (ref 11.5–15.5)
WBC: 14.2 10*3/uL — ABNORMAL HIGH (ref 4.0–10.5)
nRBC: 1.8 % — ABNORMAL HIGH (ref 0.0–0.2)

## 2018-06-23 LAB — TROPONIN I
Troponin I: 25.57 ng/mL (ref <0.03)
Troponin I: 29.56 ng/mL (ref <0.03)
Troponin I: 34.66 ng/mL (ref <0.03)

## 2018-06-23 LAB — POCT I-STAT, CHEM 8
BUN: 76 mg/dL — ABNORMAL HIGH (ref 8–23)
Calcium, Ion: 1.23 mmol/L (ref 1.15–1.40)
Chloride: 109 mmol/L (ref 98–111)
Creatinine, Ser: 4.5 mg/dL — ABNORMAL HIGH (ref 0.61–1.24)
Glucose, Bld: 179 mg/dL — ABNORMAL HIGH (ref 70–99)
HCT: 55 % — ABNORMAL HIGH (ref 39.0–52.0)
Hemoglobin: 18.7 g/dL — ABNORMAL HIGH (ref 13.0–17.0)
Potassium: 4.6 mmol/L (ref 3.5–5.1)
Sodium: 134 mmol/L — ABNORMAL LOW (ref 135–145)
TCO2: 14 mmol/L — ABNORMAL LOW (ref 22–32)

## 2018-06-23 LAB — PROCALCITONIN: Procalcitonin: 3.98 ng/mL

## 2018-06-23 LAB — PHOSPHORUS: Phosphorus: 2.7 mg/dL (ref 2.5–4.6)

## 2018-06-23 LAB — MAGNESIUM: Magnesium: 2.8 mg/dL — ABNORMAL HIGH (ref 1.7–2.4)

## 2018-06-23 MED ORDER — NOREPINEPHRINE 16 MG/250ML-% IV SOLN
0.0000 ug/min | INTRAVENOUS | Status: DC
  Administered 2018-06-23: 31 ug/min via INTRAVENOUS
  Administered 2018-06-24: 22 ug/min via INTRAVENOUS
  Filled 2018-06-23 (×2): qty 250

## 2018-06-23 MED ORDER — DEXTROSE 10 % IV SOLN: INTRAVENOUS | Status: DC | PRN

## 2018-06-23 MED ORDER — SODIUM CHLORIDE 0.9 % IV SOLN
1.0000 g | INTRAVENOUS | Status: DC
  Administered 2018-06-23: 1 g via INTRAVENOUS
  Filled 2018-06-23 (×2): qty 1

## 2018-06-23 MED ORDER — SODIUM CHLORIDE 0.9 % IV BOLUS
500.0000 mL | Freq: Once | INTRAVENOUS | Status: AC
  Administered 2018-06-23: 500 mL via INTRAVENOUS

## 2018-06-23 MED ORDER — DOCUSATE SODIUM 50 MG/5ML PO LIQD
50.0000 mg | Freq: Every day | ORAL | Status: DC
  Administered 2018-06-23: 10:00:00 50 mg via ORAL
  Filled 2018-06-23: qty 10

## 2018-06-23 MED ORDER — LACTULOSE 10 GM/15ML PO SOLN
30.0000 g | Freq: Three times a day (TID) | ORAL | Status: DC
  Administered 2018-06-23: 30 g
  Filled 2018-06-23 (×2): qty 45

## 2018-06-23 MED ORDER — VANCOMYCIN HCL 10 G IV SOLR
1500.0000 mg | Freq: Once | INTRAVENOUS | Status: AC
  Administered 2018-06-23: 1500 mg via INTRAVENOUS
  Filled 2018-06-23: qty 1500

## 2018-06-23 MED ORDER — INSULIN REGULAR(HUMAN) IN NACL 100-0.9 UT/100ML-% IV SOLN
INTRAVENOUS | Status: DC
  Administered 2018-06-23: 4.5 [IU]/h via INTRAVENOUS

## 2018-06-23 MED ORDER — INSULIN ASPART 100 UNIT/ML ~~LOC~~ SOLN: 4.0000 [IU] | SUBCUTANEOUS | Status: DC

## 2018-06-23 MED ORDER — MORPHINE 100MG IN NS 100ML (1MG/ML) PREMIX INFUSION
5.0000 mg/h | INTRAVENOUS | Status: DC
  Administered 2018-06-23: 5 mg/h via INTRAVENOUS
  Filled 2018-06-23: qty 100

## 2018-06-23 MED ORDER — FAMOTIDINE 40 MG/5ML PO SUSR
20.0000 mg | Freq: Every day | ORAL | Status: DC
  Filled 2018-06-23: qty 2.5

## 2018-06-23 MED ORDER — FAMOTIDINE IN NACL 20-0.9 MG/50ML-% IV SOLN
20.0000 mg | INTRAVENOUS | Status: DC
  Administered 2018-06-23: 20 mg via INTRAVENOUS
  Filled 2018-06-23: qty 50

## 2018-06-23 MED ORDER — VANCOMYCIN VARIABLE DOSE PER UNSTABLE RENAL FUNCTION (PHARMACIST DOSING): Status: DC

## 2018-06-23 MED ORDER — INSULIN ASPART 100 UNIT/ML ~~LOC~~ SOLN: 1.0000 [IU] | SUBCUTANEOUS | Status: DC

## 2018-06-23 MED ORDER — MIDODRINE HCL 5 MG PO TABS
5.0000 mg | ORAL_TABLET | Freq: Three times a day (TID) | ORAL | Status: DC
  Administered 2018-06-23 (×2): 5 mg
  Filled 2018-06-23 (×2): qty 1

## 2018-06-23 MED ORDER — INSULIN DETEMIR 100 UNIT/ML ~~LOC~~ SOLN
12.0000 [IU] | Freq: Two times a day (BID) | SUBCUTANEOUS | Status: DC
  Filled 2018-06-23: qty 0.12

## 2018-06-23 MED ORDER — SENNOSIDES 8.8 MG/5ML PO SYRP
10.0000 mL | ORAL_SOLUTION | Freq: Two times a day (BID) | ORAL | Status: DC
  Administered 2018-06-23: 10:00:00 10 mL via ORAL
  Filled 2018-06-23 (×2): qty 10

## 2018-06-23 MED ORDER — VANCOMYCIN HCL 10 G IV SOLR
1500.0000 mg | Freq: Once | INTRAVENOUS | Status: DC
  Filled 2018-06-23: qty 1500

## 2018-06-23 MED ORDER — INSULIN DETEMIR 100 UNIT/ML ~~LOC~~ SOLN
12.0000 [IU] | Freq: Two times a day (BID) | SUBCUTANEOUS | Status: DC
  Administered 2018-06-23: 12 [IU] via SUBCUTANEOUS
  Filled 2018-06-23 (×2): qty 0.12

## 2018-06-23 MED ORDER — ATORVASTATIN CALCIUM 40 MG PO TABS: 40.0000 mg | ORAL_TABLET | Freq: Every evening | ORAL | Status: DC

## 2018-06-23 NOTE — Progress Notes (Signed)
Spoke with family bedside.  Plan is terminally extubate in AM and start morphine drip now.  Rush Farmer, M.D. [facility] Pulmonary/Critical Care Medicine. Pager: [phone]. After hours pager: [phone].

## 2018-06-23 NOTE — Progress Notes (Signed)
CRITICAL VALUE ALERT  Critical Value:  4.2  Date & Time Notied:  06/23/2018 0549  Provider Notified: Eliezer Bottom, MD  Orders Received/Actions taken: cultures of central line (ordered) and NS 500cc bolus (given)

## 2018-06-23 NOTE — Progress Notes (Signed)
CRITICAL VALUE ALERT  Critical Value:  Lactic 7.0  Date & Time Notied:  06/23/18 0853  Provider Notified: Eliseo Gum  Orders Received/Actions taken: NP notified. No new orders at this time.

## 2018-06-23 NOTE — Progress Notes (Signed)
   Was contacted by Dr. Nelda Marseille from [facility] after rounding this morning.  EKG checked this morning shows further wide-complex rhythm concerning pattern for possible lateral ST elevation MI versus just simply wide-complex bundle branch block.  He does have a troponin of 25 which would suggest that somewhere last night he may have a suffered an infarct.  Quite possibly this is related to his native circumflex which was subtotally occluded during diagnostic cath.  I do not suspect that this is related to his RCA however cannot exclude that.  Based on his current clinical condition, I had a long talk with the patient's wife, [name] [name].  Although she is not yet ready to say goodbye, she also realizes that she would prefer for him to rest and not move forward with invasive procedures such as cardiac catheterizations unless he shows signs of improvement.  As such, we will simply treat medically, consider IV heparin as he is already on Plavix.  He has now been made partial no code with no further CPR.  Also Dr. Nelda Marseille discussed his poor candidacy for tracheostomy tube etc.     [name], M.D., M.S. Interventional Cardiologist   Pager # [phone] Phone # [phone] [address] [place], [place] [address]

## 2018-06-23 NOTE — Progress Notes (Signed)
Pharmacy Antibiotic Note  [name] [name] is a 78 y.o. male admitted on 06/29/2018 for chest pain s/p DES on 6/9. PMH significant for CAD, CABG, ESRD on HD TTS, T2DM, HTN, stroke, colon cancer s/p resection. Patient underwent HD and VT cardiac arrested. ROSC after 20 mins. Patient intubated and transferred to ICU. Patient now being evaluated for sepsis. Pharmacy has been consulted for vancomycin/cefepime dosing. -lactate 4.2, WBC elevated at 14.2 up from 9.8, ALT/AST elevated at 1908/2700, Tmax 101.66F -Pt previously on CRRT now on IHD next session 6/16  Plan: Start cefepime 1gm IV every 24 hours. Start vancomycin 1500mg  IV loading dose x1 Then vancomycin 750mg  IV qHD sessions. Follow up cultures and sensitivities, clinical improvement, HD session tolerance, and antibiotic de-escalation when appropriate.   Height: 5\' 5"  (165.1 cm) Weight: 137 lb 2 oz (62.2 kg) IBW/kg (Calculated) : 61.5  Temp (24hrs), Avg:100.2 F (37.9 C), Min:97.5 F (36.4 C), Max:101.1 F (38.4 C)  Recent Labs  Lab 06/19/18 0455  06/20/18 0505 06/20/18 1600 06/21/18 0312 06/22/18 0317 06/23/18 0338 06/23/18 0409  WBC 19.5*  --  17.7*  --  14.0* 9.8 14.2*  --   CREATININE  --    < > 2.59* 2.18* 1.89* 3.24* 4.78*  --   LATICACIDVEN  --   --   --   --   --   --   --  4.2*   < > = values in this interval not displayed.    Estimated Creatinine Clearance: 11.3 mL/min (A) (by C-G formula based on SCr of 4.78 mg/dL (H)).    Allergies  Allergen Reactions  . Brilinta [Ticagrelor] Shortness Of Breath  . Shellfish Allergy Other (See Comments)    Causes gout flare-ups    Antimicrobials this admission: 6/15 vancomycin >> 6/15 cefepime  >>  Dose adjustments this admission: N/A  Microbiology results: 6/15 [facility] >> sent 6/16 respiratory cx >> sent Thank you for allowing pharmacy to be a part of this patient's care.  Peter Congo Rimsha Trembley 06/23/2018 8:07 AM

## 2018-06-23 NOTE — Progress Notes (Signed)
Progress Note  Patient Name: [name] [name] Date of Encounter: 06/23/2018  Primary Cardiologist: [name], MD   Subjective   Remains intubated and sedated.  Per report over the weekend was able to awaken to follow commands, but this morning is not waking up. Is now back on intermittent HD but remains on norepinephrine. Converted from Cangrelor to Plavix on 6/13.  Was febrile overnight again -> cultures being checked.  Anticipate broad-spectrum antibiotic coverage. Elevated LFTs also noted along with elevated lactate.  Concern for some component of shock liver -> also consider high likelihood of hepatic encephalopathy contributing to his inability to wean today.  Discussed with PCCM.  Likely consider KUB and if ammonia level elevated, consider lactulose.  Inpatient Medications    Scheduled Meds: . amiodarone  400 mg Per Tube BID  . aspirin  81 mg Per Tube Daily  . atorvastatin  40 mg Per Tube QPM  . B-complex with vitamin C  1 tablet Per Tube Daily  . chlorhexidine gluconate (MEDLINE KIT)  15 mL Mouth Rinse BID  . Chlorhexidine Gluconate Cloth  6 each Topical Daily  . Chlorhexidine Gluconate Cloth  6 each Topical Q0600  . cinacalcet  30 mg Oral Q supper  . clopidogrel  75 mg Per Tube Daily  . docusate  50 mg Oral Daily  . doxercalciferol  1 mcg Intravenous Q T,Th,Sa-HD  . famotidine  20 mg Oral QHS  . feeding supplement (PRO-STAT SUGAR FREE 64)  30 mL Per Tube TID  . ferric citrate  420 mg Oral TID WC  . heparin  5,000 Units Subcutaneous Q8H  . insulin detemir  10 Units Subcutaneous Q12H  . levothyroxine  125 mcg Per Tube Q0600  . mouth rinse  15 mL Mouth Rinse 10 times per day  . midodrine  5 mg Per Tube TID WC  . nystatin  5 mL Oral QID  . sennosides  10 mL Oral BID   Continuous Infusions: . sodium chloride 10 mL/hr at 06/23/18 0700  . ceFEPime (MAXIPIME) IV    . feeding supplement (VITAL 1.5 CAL) 1,000 mL (06/22/18 1328)  . insulin 4 mL/hr at 06/23/18 0700  .  norepinephrine (LEVOPHED) Adult infusion     PRN Meds: acetaminophen (TYLENOL) oral liquid 160 mg/5 mL, ferric citrate **AND** ferric citrate, lidocaine-prilocaine, Melatonin, sodium chloride flush   Vital Signs    Vitals:   06/23/18 0600 06/23/18 0630 06/23/18 0700 06/23/18 0800  BP: 109/75 105/72 100/82   Pulse: (!) 107 (!) 103 100 97  Resp: (!) 26 (!) 28 (!) 26 (!) 31  Temp: 99.5 F (37.5 C) (!) 97.5 F (36.4 C) 99.3 F (37.4 C) 99.3 F (37.4 C)  TempSrc:      SpO2: 100% 91% 96% 98%  Weight:      Height:        Intake/Output Summary (Last 24 hours) at 06/23/2018 3244 Last data filed at 06/23/2018 0700 Gross per 24 hour  Intake 3085.12 ml  Output -  Net 3085.12 ml   Last 3 Weights 06/21/2018 06/20/2018 06/19/2018  Weight (lbs) 137 lb 2 oz 148 lb 13.3 oz 157 lb 6.5 oz  Weight (kg) 62.2 kg 67.51 kg 71.4 kg      Telemetry    Sinus rhythm rate in the 90s with wide-complex QRS (has known left bundle)- Personally Reviewed  ECG    Not checked today- Personally Reviewed  Physical Exam   Physical Exam  Constitutional: No distress.  Intubated, but  no longer sedated.  Does not open eyes to command.  Seems agitated.  Chronically ill-appearing  HENT:  Head: Atraumatic.  Eyes:  Not opening eyes to commands.  Neck: No JVD present.  Left subclavian triple-lumen placed  Cardiovascular: Normal rate, regular rhythm and S1 normal. PMI is not displaced. Exam reveals gallop, S4, distant heart sounds and decreased pulses.  No murmur (Bruit heard from fistula) heard. Split S2;  Pulmonary/Chest: He has no wheezes. He exhibits no tenderness.  Intubated with mechanical breath sounds.  Coarse rhonchi  Abdominal: There is no guarding.  Mildly distended and tympanitic.  Diminished bowel sounds  Musculoskeletal:        General: Edema (Diffuse peripheral edema.  Both feet in booties and hands with mittens.) present.  Neurological:  No longer sedated, but remains minimally responsive   Skin: Skin is warm and dry.  Vitals reviewed.    Labs    Chemistry Recent Labs  Lab 06/20/18 1600 06/21/18 0312  06/22/18 0317 06/22/18 0324 06/23/18 0338 06/23/18 0347  NA 136 134*   < > 134* 134* 135 134*  K 3.9 3.7   < > 3.7 3.7 4.3 4.2  CL 102 101  --  100  --  102  --   CO2 23 22  --  22  --  16*  --   GLUCOSE 129* 162*  --  199*  --  152*  --   BUN 12 10  --  49*  --  91*  --   CREATININE 2.18* 1.89*  --  3.24*  --  4.78*  --   CALCIUM 8.9 9.0  --  9.1  --  9.6  --   PROT  --   --   --   --   --  5.4*  --   ALBUMIN 2.5* 2.4*  --   --   --  2.0*  --   AST  --   --   --   --   --  2,700*  --   ALT  --   --   --   --   --  1,908*  --   ALKPHOS  --   --   --   --   --  121  --   BILITOT  --   --   --   --   --  0.7  --   GFRNONAA 28* 33*  --  17*  --  11*  --   GFRAA 33* 39*  --  20*  --  13*  --   ANIONGAP 11 11  --  12  --  17*  --    < > = values in this interval not displayed.     Hematology Recent Labs  Lab 06/21/18 0312  06/22/18 0317 06/22/18 0324 06/23/18 0338 06/23/18 0347  WBC 14.0*  --  9.8  --  14.2*  --   RBC 3.69*  --  3.09*  --  3.18*  --   HGB 12.0*   < > 10.1* 10.2* 10.5* 10.2*  HCT 37.5*   < > 31.5* 30.0* 32.1* 30.0*  MCV 101.6*  --  101.9*  --  100.9*  --   MCH 32.5  --  32.7  --  33.0  --   MCHC 32.0  --  32.1  --  32.7  --   RDW 14.6  --  14.7  --  14.6  --   PLT 191  --  198  --  220  --    < > = values in this interval not displayed.    Cardiac Enzymes Recent Labs  Lab 07/03/2018 2014  TROPONINI 0.52*   No results for input(s): TROPIPOC in the last 168 hours.   BNPNo results for input(s): BNP, PROBNP in the last 168 hours.   DDimer No results for input(s): DDIMER in the last 168 hours.   Radiology    Dg Chest Port 1 View  Result Date: 06/23/2018 CLINICAL DATA:  Intubation. EXAM: PORTABLE CHEST 1 VIEW COMPARISON:  06/22/2018. FINDINGS: Endotracheal tube, feeding tube, left subclavian central line in stable position. Stable  appearing left subclavian/axillary vascular stents. Prior CABG. Heart size stable. Bilateral perihilar and bibasilar infiltrates/edema unchanged. No pleural effusion. Tiny left pneumothorax cannot be completely excluded. Follow-up exam suggested. Carotid vascular calcification. IMPRESSION: 1.  Lines and tubes in stable position. 2. Prior CABG. Heart size stable. Bilateral perihilar and bibasilar infiltrates/edema unchanged. 3. Tiny left apical pneumothorax cannot be completely excluded. Follow-up exam suggested. Critical Value/emergent results were called by telephone at the time of interpretation on 06/23/2018 at 6:31 am to nurse Hashm, who verbally acknowledged these results. Electronically Signed   By: Marcello Moores  Register   On: 06/23/2018 06:28   Dg Chest Port 1 View  Result Date: 06/22/2018 CLINICAL DATA:  Intubated. EXAM: PORTABLE CHEST 1 VIEW COMPARISON:  06/21/2018 FINDINGS: Endotracheal tube terminates at the carina. Enteric tube courses into the abdomen with tip not imaged. Right jugular and left subclavian catheters are unchanged. Sequelae of CABG are again identified. The cardiomediastinal silhouette is unchanged with normal heart size. Bibasilar and perihilar opacities are stable to mildly improved. No sizable pleural effusion or pneumothorax is identified. IMPRESSION: 1. Endotracheal tube at the carina. Recommend retracting 3 cm. 2. Stable to mildly improvement of bibasilar and perihilar opacities. Electronically Signed   By: Logan Bores M.D.   On: 06/22/2018 08:14    Cardiac Studies    Orbital atherectomy based PCI of the RCA 06/10/2018 -  STENT RESOLUTE ONYX3.0X38 (3.7 mm) with PTCA of mRCA ISR; -- EF 50-55%    TTE 06/18/2018: EF now severely reduced 30 to 35% with global hypokinesis.  GRII DD.  LV apical akinesis.  RV function also moderately reduced.   Patient Profile     78 y.o. male with ESRD on HD with multivessel CAD status post CABG with multiple PCI's now status post atherectomy  based PCI of ostial proximal RCA with PTCA of mid RCA ISR.  Had one episode of VT during the procedure with transient RCA occlusion.  Recovered well.  Was doing well post PCI, however started having chest pain and had intermittent runs of VT followed by sustained VT requiring CPR and defibrillation several hours post procedure.  Transiently on CVVHD, now switched to intermittent HD.  Remains on levo fed for pressure support.  Of note, the patient has baseline borderline hypotension not very responsive to Midrin in the past.  Is barely been able to tolerate Imdur or metoprolol due to hypotension during dialysis.  Pre-PCI EF was normal.  Assessment & Plan    Principal Problem:   Progressive angina (HCC) Active Problems:   Diabetes mellitus with neuropathy ([place])   CAD S/P percutaneous coronary angioplasty   Coronary artery disease involving native coronary artery of native heart with angina pectoris ([place])   Coronary stent restenosis due to scar tissue   Acute combined systolic and diastolic heart failure ([place])   Cardiogenic shock ([place])  Encephalopathy, metabolic   Acute liver failure   Hyperlipidemia with target LDL less than 70   Presence of drug coated stent in right coronary artery   Hypotension of hemodialysis   ESRD on dialysis [facility])   Ventricular tachycardia (HCC)   Cardiac arrest (HCC)   Fever  1. Multivessel CAD-PCI -> initially brought in for staged PCI of the RCA with excellent angiographic results.  Was initially stable post PCI.  Back on Plavix after initially being on cangrelor.  Was loaded and now on 75 mg daily.  Was only able to tolerate Imdur and very very low-dose beta-blocker at home --> would not try to restart at this point.  Is currently on amiodarone providing beta-blocker effect.  2. Ventricular tachycardia/cardiac arrest -status post resuscitation with initiation of amiodarone.  Now complicated by cardiogenic shock.  Suspect VT arrest related to potassium 7.7  and hypomagnesemia.  Initial precath labs showed potassium 5.9, but on recheck was 3.2.  Did well post-cath despite requiring one episode of NSVT requiring defibrillation in the Cath Lab.  Switch to p.o. amiodarone rhythm remaining stable.  Monitor to keep potassium 4.0-5.5 and magnesium greater than 2  3. Chart indicates chronic systolic CHF, however he does not have chronic systolic CHF he has chronic diastolic heart failure with preserved EF but now has new cardiomyopathy with EF 30 to 35% on echo with moderate RV dysfunction.  This is probably more consistent with postarrest cardiomyopathy as his previous PCI EF by LV gram was normal. -->  He has severe multivessel disease and likely was globally ischemic during his cardiac arrest.  Volume control per hemodialysis.  Still appears to have pitting edema.  Co-ox 0.9   Unable to tolerate standard CHF medications due to hypotension  4. Cardiogenic shock/hypotension -> has been on Levophed since arrest.  At baseline he is hypotensive more than he currently is right now.  Has not really been all that responsive to midodrine in the past.    Remains on Levophed: Would target map levels of 60-65 unless there is concern for sepsis.  5. ESRD on HD: Volume management per renal.  Now on intermittent dialysis. 6. Fevers: Per PCCM.  Anticipated empiric coverage.  Blood cultures pending.  Defer to PCCM 7. No bowel movement with elevated LFTs (acute liver failure) likely consistent with shock liver -> likely check KUB, consider lactulose which may help with encephalopathy 8. Encephalopathy: Late last week was more awake and alert than he is today.  Suspect combination of hepatic encephalopathy with severely elevated LFTs likely in the setting of his recent cardiac arrest.  Suspect shock liver, but also suspect poor renal clearance of sedatives. ->  No plans for further sedation. 9. Pulmonary: Weaning from the vent per PCCM, but seems to be pulling low  volumes.  Unlikely to explain today.  Total critical care time 35-40 minutes.  Critical care time was exclusive of separately billable procedures and treating other patients.  Critical care was necessary to treat or prevent imminent or life-threatening deterioration.  Critical care was time spent personally by me on the following activities: development of treatment plan with patient and/or surrogate as well as nursing, discussions with consultants (PCCM), evaluation of patient's response to treatment, examination of patient, obtaining history from patient or surrogate, ordering and performing treatments and interventions, ordering and review of laboratory studies, ordering and review of radiographic studies, pulse oximetry and re-evaluation of patient's condition.    For questions or updates, please contact [place] Please  consult [email or web address] for contact info under        Signed, [name], MD  06/23/2018, 8:33 AM

## 2018-06-23 NOTE — Progress Notes (Signed)
CRITICAL VALUE ALERT  Critical Value:  Troponin 25.57  Date & Time Notified:  06/23/2018 1004  Provider Notified: Eliseo Gum   Orders Received/Actions taken: MD notified. Consulting cardiology

## 2018-06-23 NOTE — Progress Notes (Signed)
eLink Physician-Brief Progress Note Patient Name: [name] [name] DOB: 1940-08-12 MRN: [id number]   Date of Service  06/23/2018  HPI/Events of Note  ABG 7.43/26/79 FiO2 30% Increasing pressor requirement Tmax yesterday at 101 (4pm) cultures drawn, off antibiotics  eICU Interventions   Lactate ordered resulted at 4.2  Normal bolus 500 cc  Obtain culture from left subclavian, low threshold for antibiotics     Intervention Category Major Interventions: Acid-Base disturbance - evaluation and management;Hypotension - evaluation and management  Judd Lien 06/23/2018, 5:49 AM

## 2018-06-23 NOTE — Progress Notes (Signed)
Ammonia 141. NP notified. Orders received for lactulose.

## 2018-06-23 NOTE — Progress Notes (Addendum)
NAME:  [name] [name], MRN:  [id number], DOB:  08-Aug-1940, LOS: 5 ADMISSION DATE:  06/11/2018, CONSULTATION DATE:  07/07/2018 REFERRING MD:  Code team, CHIEF COMPLAINT:  Cardiac arrest  Brief History   78 year old male admitted for progressive angina s/p successful balloon angioplasty of the mid RCA and atherectomy based DES PCI of the ostial-proximal RCA 6/9 am.  Had ongoing VT throughout the day attributed to hypokalemia and hypomagnesemia.  Prior to [facility], he was noted to have K 7.4.  Had VT cardiac arrest shortly after starting iHD with ROSC after 20 mins.  Intubated and transferred to ICU, remains unresponsive.    History of present illness   HPI obtained from medical chart review as patient is intubated and sedated on mechanical ventilation.    78 year old male with complex history of extensive CAD s/p CABG with multiple PCI and PTCA interventions on the RCA for in-stent restenosis, Ischemic cardiomyopathy, ERSD on HD, DMT2, HTN, HLD, PUD, anemia, hypothyroidism, stroke, colon cancer s/p resection admitted 6/9 with progressive angina.   He had reported he was taking up to 2 bottles of nitroglycerin a month with angina at rest and waking him up at night with some ongoing hypotension on his delayed follow-up cardiology appointment on 5/28.  He underwent cardiac catheterization on 6/5 which showed modest progression of his mid RCA in-stent restenosis but new de novo lesion in the proximal RCA that would require staged atherectomy based PCI.  Therefore he was admitted 6/9 and underwent successful balloon angioplasty of the mid RCA and atherectomy based DES PCI of the ostial-proximal RCA; he required defibrillation x 1 during case for ventricular tachycardia.  Throughout the day he had intermittent episode of asymptomatic VT and noted to have hypokalemia and hypomagnesemia which were replaced and he was started on amiodarone drip.  He had also complained of ongoing chest pain without relief after sublingual  nitro. He was noted to have K 7.2 prior to dialysis.  He was started on iHD and after 7 minutes decompensated to VT cardiac arrest with ROSC obtained after 20 mins with ACLS measures including temporizing measures for hyperkalemia, lidocaine, amiodarone, and defibrillation x 2.  He was intubated by RT.  Post arrest, transferred to ICU.  He remained unresponsive and normotensive in SR.  PCCM consulted for further medical and ventilator management.  Nephrology has asked for hemodialysis catheter to initiate CRRT.   Past Medical History  Extensive CAD s/p CABG with multiple PCI and PTCA interventions on the RCA for in-stent restenosis, Ischemic cardiomyopathy, ERSD on HD, DMT2, HTN, HLD, PUD, anemia, hypothyroidism, stroke, colon cancer s/p resection  Significant Hospital Events   6/9 Admit/ cardiac arrest  Consults:  Cardiology Nephrology  Procedures:  6/9 ETT>>> 6/9 R IJ HD cath 6/10 L subclavian>>   Significant Diagnostic Tests:  Cardiac Cath June 13, 2018:  Severe multivessel native CAD with CTO of ostial LCx that has 3 major OM branches all occluded, LAD after diffusely diseased D1, and new ostial RCA disease with moderate in-stent restenosis of the mid RCA.  Patent LIMA-LAD and SVG-OM 2 with occluded side branch of OM 2 and essentially subtotal occlusion of OM 2 that is not PCI amenable   Despite significant CAD, relatively preserved LVEF and EDP  6/9 LHC  >>  CULPRIT LESION: Ost RCA to Prox RCA lesion is 90% stenosed. Prox RCA lesion is 45% stenosed. -Moderate-severely calcified  A drug-eluting stent was successfully placed using a STENT RESOLUTE VVOH6.0V37. Postdilated in  tapered fashion from 3.7 down to 3.2 mm  Post intervention, there is a 0% residual stenosis.  ------------------------------------------------------------  Mid RCA-2 overlapping stent segment is ~ 65% stenosed.  Scoring balloon angioplasty was performed using a BALLOON WOLVERINE 3.00X6.  Post  intervention, there is a 15% residual stenosis.  ---------------------------------------------------------------------  Dist RCA lesion is 45% stenosed. Post Atrio lesion is 100% stenosed.  6/15 CXR ETT, GT, CVC stable position. Bibasilar opacities. Cannot exclude L apical PTX   Micro Data:  6/14 BCx >>   Antimicrobials:  Vanc 6/15>> Cefepime 6/15>>  Interim history/subjective:   Increasing pressor requirement overnight Febrile, increasing leukocytosis  Objective   Blood pressure 109/75, pulse (!) 107, temperature 99.5 F (37.5 C), resp. rate (!) 26, height _0  (1.651 m), weight 62.2 kg, SpO2 100 %. CVP:  [5 mmHg-11 mmHg] 6 mmHg  Vent Mode: PRVC FiO2 (%):  [30 %] 30 % Set Rate:  [16 bmp] 16 bmp Vt Set:  [500 mL] 500 mL PEEP:  [5 cmH20] 5 cmH20 Pressure Support:  [5 cmH20-10 cmH20] 5 cmH20 Plateau Pressure:  [15 cmH20-22 cmH20] 20 cmH20   Intake/Output Summary (Last 24 hours) at 06/23/2018 0713 Last data filed at 06/23/2018 0500 Gross per 24 hour  Intake 1827.57 ml  Output --  Net 1827.57 ml   Filed Weights   06/19/18 0257 06/20/18 0315 06/21/18 0545  Weight: 71.4 kg 67.5 kg 62.2 kg   Examination: General:  Chronically ill appearing critically ill elderly adult male. Intubated, encephalopathic but not sedated, NAD  HEENT: NCAT. ETT OGT secure. Trachea midline  Neuro: Intermittently opens eyes to voice. PERRL.  CV: RRR s1s2 no rgm. No JVD. Capillary refill 3 seconds  PULM: Diminished bibasilar sounds with some scattered rhonchi. Symmetrical chest expansion. Synchronous with vent  GI: Round, mildly distended abdomen. + bowel sounds  Extremities: Symmetrical bulk and tone. Mild cyanosis BUE BLE. No clubbing  Skin: scattered ecchymosis. Skin clean, dry, cool    Resolved Hospital Problem list    Assessment & Plan:   S/p VT cardiac arrest  s/p recurrent VT w/ electrolyte imbalances Progressive angina w/ Extensive CAD s/p prior CABG with multiple PCI and PTCA  interventions on the RCA for in-stent restenosis s/p 6/9 Balloon angioplasty and atherectomy based DES PCI of the ostial-proximal RCA Ischemic cardiomyopathy  Chronic systolic CHF  P:  Continue tele ECG this morning Continue NE as below CVP goal 12  PO amio AM mag, phos, iCa++. Potassium goal > 4, Mag goal > 2  ASA, Plavix  Shock -cardiogenic vs septic -overnight TMax 101.1, WBC to 14.2, lactic acid 4.2  -leukocytosis reactive vs infectious -increasing pressor requirement overnight  P MAP goal > 60. Discussed with Dr. Ellyn Hack, cardiology Continue midodrine, NE titrated for goal Follow up BCx  Send tracheal aspirate  PCT Empiric vanc/cefepime per pharm dosing  Acute Encephalopathy - post cardiac arrest w/ ROSC after 20 mins P:  S/p 33 degree TTM with rewarming Remains off all sedation Check ammonia as below in setting of transaminitis.  ICU delirium RN interventions/bundle If does not improve, consider CT Head, EEG, possible neuro cs   Acute respiratory failure in setting of cardiac arrest -CXR reveals bibasilar opacity, cannot exclude L apical PTX  -Vent wean 6/15 failed-- poor Vt, tachypnea to 40s, poor mental status  P:  Repeat CXR today to evaluate possible L apical pneumothorax.  AM CXR WUA/SBT Mental status ongoing barrier to extubation  Pulm hygiene  Adjust vent for spo2 goal 88-92% Tracheal  aspirate, PCT as above   Transaminitis 6/15 Alk phos 121 AST 2700 ALT 1908  -shock liver vs underlying hepatic dysfunction vs hepatorenal syndrome P Check ammonia given ongoing encephalopathy. If elevated, start Lactulose  Trend LFTs Send acute hepatitis panel KUB   ESRD on iHD P:  Nephrology following, appreciate management  [facility] for [facility] 5/16  Follow CMP, manage electrolyte abnormalities PRN  Constipation P KUB BID senna per tube  qD Docusate  DM with acute hyperglycemia -in setting of critical illness P:  Insulin gtt   Hx HTN Home imdur and  metoprolol P:  Holding antihypertensives in setting of shock requiring pressors   HLD P:  Continue statin   Hypothyroidism  P Synthroid  Reported dysphagia by family P:  TF through [facility]rak  Best practice:  Diet: EN  Pain/Anxiety/Delirium protocol (if indicated): none VAP protocol (if indicated): yes DVT prophylaxis: heparin SQ GI prophylaxis: pepcid Glucose control: insulin gtt per protocol Mobility: BR  Code Status: Full  Family Communication: per primary  Disposition:  ICU  Labs   CBC: Recent Labs  Lab 06/19/18 0455  06/20/18 0505 06/21/18 0312 06/21/18 0326 06/22/18 0317 06/22/18 0324 06/23/18 0338 06/23/18 0347  WBC 19.5*  --  17.7* 14.0*  --  9.8  --  14.2*  --   HGB 12.4*   < > 11.8* 12.0* 12.2* 10.1* 10.2* 10.5* 10.2*  HCT 37.9*   < > 37.0* 37.5* 36.0* 31.5* 30.0* 32.1* 30.0*  MCV 98.4  --  102.5* 101.6*  --  101.9*  --  100.9*  --   PLT 269  --  212 191  --  198  --  220  --    < > = values in this interval not displayed.   Basic Metabolic Panel: Recent Labs  Lab 06/19/18 0454  06/20/18 0505 06/20/18 1600 06/21/18 0312 06/21/18 0326 06/22/18 0317 06/22/18 0324 06/23/18 0338 06/23/18 0347  NA 136   < > 133* 136 134* 135 134* 134* 135 134*  K 6.1*   < > 4.5 3.9 3.7 3.8 3.7 3.7 4.3 4.2  CL 102   < > 100 102 101  --  100  --  102  --   CO2 22   < > 21* 23 22  --  22  --  16*  --   GLUCOSE 130*   < > 226* 129* 162*  --  199*  --  152*  --   BUN 33*   < > _0 --  49*  --  91*  --   CREATININE 4.36*   < > 2.59* 2.18* 1.89*  --  3.24*  --  4.78*  --   CALCIUM 8.4*   < > 8.5* 8.9 9.0  --  9.1  --  9.6  --   MG 2.5*  --  2.4  --  2.6*  --  2.5*  --  2.8*  --   PHOS 2.5   < > 3.9 3.4 3.4  --  2.6  --  2.7  --    < > = values in this interval not displayed.   GFR: Estimated Creatinine Clearance: 11.3 mL/min (A) (by C-G formula based on SCr of 4.78 mg/dL (H)). Recent Labs  Lab 06/20/18 0505 06/21/18 0312 06/22/18 0317 06/23/18 0338  06/23/18 0409  WBC 17.7* 14.0* 9.8 14.2*  --   LATICACIDVEN  --   --   --   --  4.2*   Liver  Function Tests: Recent Labs  Lab 06/20/18 0351 06/20/18 0505 06/20/18 1600 06/21/18 0312 06/23/18 0338  AST  --   --   --   --  2,700*  ALT  --   --   --   --  1,908*  ALKPHOS  --   --   --   --  121  BILITOT  --   --   --   --  0.7  PROT  --   --   --   --  5.4*  ALBUMIN 1.5* 2.5* 2.5* 2.4* 2.0*   No results for input(s): LIPASE, AMYLASE in the last 168 hours. No results for input(s): AMMONIA in the last 168 hours.  ABG    Component Value Date/Time   PHART 7.408 06/23/2018 0347   PCO2ART 26.6 (L) 06/23/2018 0347   PO2ART 79.0 (L) 06/23/2018 0347   HCO3 16.7 (L) 06/23/2018 0347   TCO2 17 (L) 06/23/2018 0347   ACIDBASEDEF 7.0 (H) 06/23/2018 0347   O2SAT 95.0 06/23/2018 0347   Coagulation Profile: Recent Labs  Lab 06/18/18 0206 06/18/18 1000  INR 1.0 1.2   Cardiac Enzymes: Recent Labs  Lab 06/16/2018 2014  TROPONINI 0.52*   HbA1C: Hgb A1c MFr Bld  Date/Time Value Ref Range Status  04/11/2018 09:30 AM 5.4 4.6 - 6.5 % Final    Comment:    Glycemic Control Guidelines for People with Diabetes:Non Diabetic:  <6%Goal of Therapy: <7%Additional Action Suggested:  >8%   10/21/2017 09:25 AM 5.5 4.6 - 6.5 % Final    Comment:    Glycemic Control Guidelines for People with Diabetes:Non Diabetic:  <6%Goal of Therapy: <7%Additional Action Suggested:  >8%    CBG: Recent Labs  Lab 06/23/18 0116 06/23/18 0222 06/23/18 0344 06/23/18 0457 06/23/18 0612  GLUCAP 130* 134* 143* 171* 164*   Critical Care Time 55 minutes    Eliseo Gum MSN, AGACNP-BC [place] [phone] If no answer, [phone] 06/23/2018, 7:13 AM  Attending Note:  78 year old male with CAD and CKI who presents to the hospital post cardiac arrest and hypothermia.  Overnight, desaturation and increased lactic acidosis.  On exam, he is less alert this AM.  I reviewed CXR myself,  ETT is in a good position and no PTX.  Continue full vent support.  Spoke with wife and described the case extensively.  Patient is now a DNR with no further dialysis per wife.  Will start broad spectrum antibiotics.  Begin lactulose for hyperammonemia.  PCCM will continue to follow.  The patient is critically ill with multiple organ systems failure and requires high complexity decision making for assessment and support, frequent evaluation and titration of therapies, application of advanced monitoring technologies and extensive interpretation of multiple databases.   Critical Care Time devoted to patient care services described in this note is  32  Minutes. This time reflects time of care of this signee Dr Jennet Maduro. This critical care time does not reflect procedure time, or teaching time or supervisory time of PA/NP/Med student/Med Resident etc but could involve care discussion time.  Rush Farmer, M.D. [facility] Pulmonary/Critical Care Medicine. Pager: [phone]. After hours pager: [phone].

## 2018-06-23 NOTE — Progress Notes (Signed)
[facility] Progress Note   Assessment/ Plan:   Dialysis: [facility]  4h  2/2.25 bath  63.5kg  Hep 3000  LUE AVF  350/800  P4 Hectoral 11mg IV/HD Micera 30 mcg IV every 6 weeks (last given 05/13/18)  Assessment/Plan: 1. VT arrest- after PCI, in setting of hyperkalemia, per cards, on amio 2. Resp failure - on vent, per CCM, not following commands today 3. Septic shock: pressor requirement increasing, up to norepi 31.  On vanc/ cefepime per PCCM.  Lactate 7.0.  Blood cultures negative so far 4. Shock liver: likely hemodynamic, would rec check ammonia, per PCCM 5. Multivessel CAD- s/p intervention of 2 lesions in RCA and DES placed in prox RCA 6. ESRD- sp CRRT 6/10- 6/13. DC temp cath 6/14. If cannot sig decrease pressor requirement, would need to go back on CRRT which would necessitate another temp HD cath 7. Anemia- Hb > 10 no need for esa   Subjective:    Shock liver, pressor requirement increasing.  Temp Vascath removed 6/14 for line holiday.  Opened eyes this AM but not following commands.  On broad spectrum antibiotics.  Cultures 6/14 NGTD.   Objective:   BP 100/82   Pulse 97   Temp 99.3 F (37.4 C)   Resp (!) 31   Ht _0  (1.651 m)   Wt 62.2 kg   SpO2 98%   BMI 22.82 kg/m   Physical Exam: Gen: intubated, ill-appearing Neck: R IJ vascath site dressed CVS: tachycardic, no m/r/g Resp: bilateral coarse breath sounds Abd: hypoactive bowel sounds, mildly distended, + hepatomegaly. Ext: no LE edema, bluish discoloration of toes ACCESS: LUE AVF + T/B  Labs: BMET Recent Labs  Lab 06/19/18 1547 06/20/18 0351  06/20/18 0505 06/20/18 1600 06/21/18 0312 06/21/18 0326 06/22/18 0317 06/22/18 0324 06/23/18 0338 06/23/18 0347  NA 135 140   < > 133* 136 134* 135 134* 134* 135 134*  K 5.2* 2.8*   < > 4.5 3.9 3.7 3.8 3.7 3.7 4.3 4.2  CL 103 119*  --  100 102 101  --  100  --  102  --   CO2 23 16*  --  21* 23 22  --  22  --  16*  --   GLUCOSE 104* 99   --  226* 129* 162*  --  199*  --  152*  --   BUN 23 11  --  _1 --  49*  --  91*  --   CREATININE 3.44* 1.52*  --  2.59* 2.18* 1.89*  --  3.24*  --  4.78*  --   CALCIUM 8.3* 5.2*  --  8.5* 8.9 9.0  --  9.1  --  9.6  --   PHOS 3.9 2.3*  --  3.9 3.4 3.4  --  2.6  --  2.7  --    < > = values in this interval not displayed.   CBC Recent Labs  Lab 06/20/18 0505 06/21/18 0312  06/22/18 0317 06/22/18 0324 06/23/18 0338 06/23/18 0347  WBC 17.7* 14.0*  --  9.8  --  14.2*  --   HGB 11.8* 12.0*   < > 10.1* 10.2* 10.5* 10.2*  HCT 37.0* 37.5*   < > 31.5* 30.0* 32.1* 30.0*  MCV 102.5* 101.6*  --  101.9*  --  100.9*  --   PLT 212 191  --  198  --  220  --    < > = values in  this interval not displayed.    _0 @ Medications:    . amiodarone  400 mg Per Tube BID  . aspirin  81 mg Per Tube Daily  . atorvastatin  40 mg Per Tube QPM  . B-complex with vitamin C  1 tablet Per Tube Daily  . chlorhexidine gluconate (MEDLINE KIT)  15 mL Mouth Rinse BID  . Chlorhexidine Gluconate Cloth  6 each Topical Daily  . Chlorhexidine Gluconate Cloth  6 each Topical Q0600  . cinacalcet  30 mg Oral Q supper  . clopidogrel  75 mg Per Tube Daily  . docusate  50 mg Oral Daily  . doxercalciferol  1 mcg Intravenous Q T,Th,Sa-HD  . famotidine  20 mg Oral QHS  . feeding supplement (PRO-STAT SUGAR FREE 64)  30 mL Per Tube TID  . ferric citrate  420 mg Oral TID WC  . heparin  5,000 Units Subcutaneous Q8H  . insulin detemir  10 Units Subcutaneous Q12H  . levothyroxine  125 mcg Per Tube Q0600  . mouth rinse  15 mL Mouth Rinse 10 times per day  . midodrine  5 mg Per Tube TID WC  . nystatin  5 mL Oral QID  . sennosides  10 mL Oral BID  . vancomycin variable dose per unstable renal function (pharmacist dosing)   Does not apply See admin instructions     Madelon Lips, MD [place] pgr [phone] 06/23/2018, 8:53 AM

## 2018-06-24 ENCOUNTER — Inpatient Hospital Stay (HOSPITAL_COMMUNITY): Payer: Medicare Other

## 2018-06-24 LAB — COMPREHENSIVE METABOLIC PANEL
ALT: 7240 U/L — ABNORMAL HIGH (ref 0–44)
AST: 10565 U/L — ABNORMAL HIGH (ref 15–41)
Albumin: 1.9 g/dL — ABNORMAL LOW (ref 3.5–5.0)
Alkaline Phosphatase: 162 U/L — ABNORMAL HIGH (ref 38–126)
Anion gap: 17 — ABNORMAL HIGH (ref 5–15)
BUN: 122 mg/dL — ABNORMAL HIGH (ref 8–23)
CO2: 13 mmol/L — ABNORMAL LOW (ref 22–32)
Calcium: 10.1 mg/dL (ref 8.9–10.3)
Chloride: 106 mmol/L (ref 98–111)
Creatinine, Ser: 6 mg/dL — ABNORMAL HIGH (ref 0.61–1.24)
GFR calc Af Amer: 10 mL/min — ABNORMAL LOW (ref >60)
GFR calc non Af Amer: 8 mL/min — ABNORMAL LOW (ref >60)
Glucose, Bld: 135 mg/dL — ABNORMAL HIGH (ref 70–99)
Potassium: 6 mmol/L — ABNORMAL HIGH (ref 3.5–5.1)
Sodium: 136 mmol/L (ref 135–145)
Total Bilirubin: 1.3 mg/dL — ABNORMAL HIGH (ref 0.3–1.2)
Total Protein: 4.8 g/dL — ABNORMAL LOW (ref 6.5–8.1)

## 2018-06-24 LAB — BLOOD CULTURE ID PANEL (REFLEXED)

## 2018-06-24 LAB — CBC
HCT: 32 % — ABNORMAL LOW (ref 39.0–52.0)
Hemoglobin: 10.1 g/dL — ABNORMAL LOW (ref 13.0–17.0)
MCH: 32.9 pg (ref 26.0–34.0)
MCHC: 31.6 g/dL (ref 30.0–36.0)
MCV: 104.2 fL — ABNORMAL HIGH (ref 80.0–100.0)
Platelets: 183 10*3/uL (ref 150–400)
RBC: 3.07 MIL/uL — ABNORMAL LOW (ref 4.22–5.81)
RDW: 15 % (ref 11.5–15.5)
WBC: 16.2 10*3/uL — ABNORMAL HIGH (ref 4.0–10.5)
nRBC: 5.8 % — ABNORMAL HIGH (ref 0.0–0.2)

## 2018-06-24 LAB — POCT I-STAT 7, (LYTES, BLD GAS, ICA,H+H)
Acid-base deficit: 11 mmol/L — ABNORMAL HIGH (ref 0.0–2.0)
Bicarbonate: 13.8 mmol/L — ABNORMAL LOW (ref 20.0–28.0)
Calcium, Ion: 1.36 mmol/L (ref 1.15–1.40)
HCT: 30 % — ABNORMAL LOW (ref 39.0–52.0)
Hemoglobin: 10.2 g/dL — ABNORMAL LOW (ref 13.0–17.0)
O2 Saturation: 97 %
Potassium: 5.8 mmol/L — ABNORMAL HIGH (ref 3.5–5.1)
Sodium: 134 mmol/L — ABNORMAL LOW (ref 135–145)
TCO2: 15 mmol/L — ABNORMAL LOW (ref 22–32)
pCO2 arterial: 27 mmHg — ABNORMAL LOW (ref 32.0–48.0)
pH, Arterial: 7.317 — ABNORMAL LOW (ref 7.350–7.450)
pO2, Arterial: 93 mmHg (ref 83.0–108.0)

## 2018-06-24 LAB — HEPATITIS PANEL, ACUTE
HCV Ab: 0.1 s/co ratio (ref 0.0–0.9)
Hep A IgM: NEGATIVE
Hep B C IgM: NEGATIVE
Hepatitis B Surface Ag: NEGATIVE

## 2018-06-24 LAB — PROCALCITONIN: Procalcitonin: 6.51 ng/mL

## 2018-06-24 LAB — GLUCOSE, CAPILLARY
Glucose-Capillary: 105 mg/dL — ABNORMAL HIGH (ref 70–99)
Glucose-Capillary: 120 mg/dL — ABNORMAL HIGH (ref 70–99)
Glucose-Capillary: 122 mg/dL — ABNORMAL HIGH (ref 70–99)
Glucose-Capillary: 123 mg/dL — ABNORMAL HIGH (ref 70–99)
Glucose-Capillary: 133 mg/dL — ABNORMAL HIGH (ref 70–99)
Glucose-Capillary: 133 mg/dL — ABNORMAL HIGH (ref 70–99)

## 2018-06-24 LAB — MAGNESIUM: Magnesium: 3 mg/dL — ABNORMAL HIGH (ref 1.7–2.4)

## 2018-06-24 LAB — PHOSPHORUS: Phosphorus: 5.6 mg/dL — ABNORMAL HIGH (ref 2.5–4.6)

## 2018-06-24 LAB — TROPONIN I: Troponin I: 30.2 ng/mL (ref <0.03)

## 2018-06-24 MED ORDER — SODIUM CHLORIDE 0.9 % IV BOLUS
500.0000 mL | Freq: Once | INTRAVENOUS | Status: AC
  Administered 2018-06-24: 04:00:00 500 mL via INTRAVENOUS

## 2018-06-24 MED ORDER — DEXTROSE 10 % IV SOLN: INTRAVENOUS | Status: DC | PRN

## 2018-06-24 MED ORDER — INSULIN ASPART 100 UNIT/ML ~~LOC~~ SOLN: 1.0000 [IU] | SUBCUTANEOUS | Status: DC

## 2018-06-24 MED ORDER — SODIUM BICARBONATE 8.4 % IV SOLN
50.0000 meq | Freq: Once | INTRAVENOUS | Status: AC
  Administered 2018-06-24: 06:00:00 50 meq via INTRAVENOUS
  Filled 2018-06-24: qty 50

## 2018-06-24 MED ORDER — VASOPRESSIN 20 UNIT/ML IV SOLN
0.0300 [IU]/min | INTRAVENOUS | Status: DC
  Administered 2018-06-24: 04:00:00 0.03 [IU]/min via INTRAVENOUS
  Filled 2018-06-24 (×2): qty 2

## 2018-06-24 MED ORDER — CALCIUM GLUCONATE-NACL 1-0.675 GM/50ML-% IV SOLN
1.0000 g | Freq: Once | INTRAVENOUS | Status: AC
  Administered 2018-06-24: 1000 mg via INTRAVENOUS
  Filled 2018-06-24: qty 50

## 2018-06-24 MED ORDER — INSULIN DETEMIR 100 UNIT/ML ~~LOC~~ SOLN
5.0000 [IU] | Freq: Two times a day (BID) | SUBCUTANEOUS | Status: DC
  Filled 2018-06-24 (×2): qty 0.05

## 2018-06-25 LAB — CULTURE, RESPIRATORY W GRAM STAIN

## 2018-06-25 LAB — CALCIUM, IONIZED: Calcium, Ionized, Serum: 5.3 mg/dL (ref 4.5–5.6)

## 2018-06-26 LAB — CULTURE, BLOOD (ROUTINE X 2): Special Requests: ADEQUATE

## 2018-06-27 ENCOUNTER — Encounter (INDEPENDENT_AMBULATORY_CARE_PROVIDER_SITE_OTHER): Payer: Medicare Other | Admitting: Ophthalmology

## 2018-06-27 LAB — CULTURE, BLOOD (ROUTINE X 2)
Culture: NO GROWTH
Culture: NO GROWTH
Special Requests: ADEQUATE

## 2018-06-28 LAB — CULTURE, BLOOD (ROUTINE X 2): Culture: NO GROWTH

## 2018-07-09 NOTE — Progress Notes (Signed)
[facility] Progress Note Patient Name: HILLERY BHALLA DOB: 05/18/1940 MRN: [id number]   Date of Service  07-07-2018  HPI/Events of Note  Notified of bradycardia 58s. On lab review K 6  eICU Interventions   Ordered bicarb to shift and calcium to stabilize  Family called and they are on their way     Intervention Category Major Interventions: Arrhythmia - evaluation and management  Judd Lien July 07, 2018, 5:10 AM

## 2018-07-09 NOTE — Death Summary Note (Signed)
DEATH SUMMARY   Patient Details  Name: [name] [name] MRN: [id number] DOB: [birth year]  Admission/Discharge Information   Admit Date:  Jul 17, 2018  Date of Death: Date of Death: Jul 24, 2018  Time of Death: Time of Death: 0543  Length of Stay: 6  Referring Physician: Midge Minium, MD   Reason(s) for Hospitalization   Progressive angina Coronary artery disease with unstable angina  Diagnoses  Preliminary cause of death:  Secondary Diagnoses (including complications and co-morbidities):  Principal Problem:   Progressive angina ([place]) Active Problems:   Diabetes mellitus with neuropathy ([place])   CAD S/P percutaneous coronary angioplasty   Coronary artery disease involving native coronary artery of native heart with angina pectoris ([place])   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 year old male with complicated medical history of multivessel CAD-CABG and multiple PCI/PTCA procedures along with end-stage renal disease on dialysis now limited by hypotension as far as antianginals.  Prior to this hospitalization his most recent PTCA was May 2018 where he had high-pressure PTCA of recurrent in-stent restenosis of the RCA.  He had done relatively well with stable angina up until about a month before being seen on May 20th, 2020.  At that time he started noticing progressive angina to the point where he was taking 2 bottles of nitroglycerin in the course of the last 2 weeks.  He  underwent cardiac catheterization on June 5 demonstrating severe ostial RCA disease as well as moderate to severe in-stent restenosis in the in the usual mid RCA overlapped stented segment.  Based on the calcification noted in this vessel, he was planned for staged orbital atherectomy based PCI on 17-Jul-2022.  Also noted was severe progression of disease in the grafted circumflex marginal branch that was not amenable to PCI.  He presented the morning of July 17, 2022 for PCI.  Preliminary labs showed potassium 5.9 that was on recheck found to be 3.2.  There is concern that the initial labs were partially hemolyzed.  With this improved potassium, we chose to proceed with orbital atherectomy based PCI of the ostial to proximal RCA placing an 3.0 mm x38 mm resolute DES stent overlapping the mid RCA stent all the way to the ostium.  He also had PTCA of the in-stent restenosis.  Following stent balloon inflation, he did have brief episode of sustained ventricular tachycardia requiring cardioversion in the Cath Lab.  Subsequently was stable for the rest of the procedure.  The and results showed a widely patent RCA with brisk flow providing collaterals to previously occluded posterior lateral branch and circumflex system.  The plan was for him to be monitored overnight for observation and hemodialysis post-cath.  Unfortunately in hemodialysis he had another episode of ventricular tachycardia with loss of pulse.  CODE BLUE was called.  He had CPR, intubation and defibrillation.  Of note, his potassium was found to be 7.4.    Postarrest, he was placed on hypothermia protocol, and cardiogenic  shock required use of vasopressors (Levophed).  Initially they were unable to place an OG tube and therefore he was switched from Plavix to [facility] infusion.  Due to hemodynamic instability, he was placed on CVVHD that was eventually converted to intermittent dialysis over the next couple days.  His postarrest echocardiogram showed EF now  30 to 35% with apical akinesis that was new.  He is maintained in sinus rhythm with amiodarone.  After completion of cooling protocol, the patient actually started to awaken with signs of neurologic recovery, responding to commands on June 13th.  Unfortunately he became worse over the weekend of the 14th the 15th where he developed a fever and recurrent hypotension.  By the morning of June 15 he was noted to be much less responsive.  He is more tachycardic with signs of significant elevated LFTs.  EKG showed wide complex is are QRSs concerning for possible ischemia.  Troponin checked was 25 suggestive of possible acute coronary event overnight that was undetected due to him being intubated.  At this point with the patient being febrile, now to combination of cardiogenic and potentially septic shock with multiple organ system failure, with the assistance of Dr. Nelda Marseille from critical care, we had a conversation with the family about wishes.  The decision was made not to go to the Cath Lab.  He was started on broad-spectrum antibiotics, however remained hemodynamic and stable.  Due to his significant turn for the worse, the patient's family was contacted and brought him to see him.  At that time, the decision was made to start comfort care with morphine drip with plans to terminally extubate on the morning of June 16.  However, at roughly 5:43 AM the patient died with no pulse noted and agonal rhythm.   Pertinent Labs and Studies  Significant Diagnostic Studies Dg Abd 1 View  Result Date: 06/23/2018 CLINICAL DATA:  Constipation EXAM: ABDOMEN - 1 VIEW COMPARISON:  06/20/2018 FINDINGS: There is a moderate amount of stool in the ascending colon. There is small amount contrast material in the ascending colon. There is a feeding tube with the tip projecting over the stomach. There is no bowel dilatation to suggest obstruction. There is no evidence of pneumoperitoneum, portal venous gas or pneumatosis. There are no  pathologic calcifications along the expected course of the ureters. There are bilateral pedicle screws at L4-5 and L5-S1. There there is mild lucency around the left L4 pedicle screw concerning for loosening. IMPRESSION: 1. Moderate amount of stool in the ascending colon. Electronically Signed   By: Kathreen Devoid   On: 06/23/2018 09:13   Dg Chest Port 1 View  Result Date: 2018-07-19 CLINICAL DATA:  Endotracheal tube placement EXAM: PORTABLE CHEST 1 VIEW COMPARISON:  06/23/2018 FINDINGS: Endotracheal tube with the tip at the carina. Recommend retracting the endotracheal tube 2.5 cm. Feeding tube coursing below the diaphragm. Left subclavian central venous catheter with the tip projecting over the SVC. Small left pleural effusion. Left basilar airspace disease which may reflect atelectasis versus pneumonia. Stable cardiomediastinal silhouette. No aggressive osseous lesion. IMPRESSION: 1. Endotracheal tube with the tip at the carina. Recommend retracting the endotracheal tube 2.5 cm. 2. Small left pleural effusion. Left basilar airspace disease which may reflect atelectasis versus pneumonia. Electronically Signed   By: Kathreen Devoid   On: 07/19/2018 08:04   Dg Chest Port 1 View  Result Date: 06/23/2018 CLINICAL DATA:  Respiratory failure. Possible LEFT apical pneumothorax. EXAM: PORTABLE CHEST 1 VIEW COMPARISON:  Chest x-rays dated  06/23/2018 and 06/22/2018. FINDINGS: Endotracheal tube is positioned just above the level of the carina. Enteric tube passes below the diaphragm. LEFT subclavian central line is adequately positioned at the level of the mid SVC. Heart size and mediastinal contours are stable. Median sternotomy wires are stable in alignment. Mild central pulmonary vascular congestion. No overt alveolar pulmonary edema. No confluent opacity to suggest a developing pneumonia. No pleural effusions seen. No pneumothorax appreciated on the current exam. IMPRESSION: 1. No pneumothorax is seen on the current  exam. 2. Mild central pulmonary vascular congestion suggesting mild volume overload/CHF. No overt alveolar pulmonary edema. Electronically Signed   By: Franki Cabot M.D.   On: 06/23/2018 09:17   Dg Chest Port 1 View  Result Date: 06/23/2018 CLINICAL DATA:  Intubation. EXAM: PORTABLE CHEST 1 VIEW COMPARISON:  06/22/2018. FINDINGS: Endotracheal tube, feeding tube, left subclavian central line in stable position. Stable appearing left subclavian/axillary vascular stents. Prior CABG. Heart size stable. Bilateral perihilar and bibasilar infiltrates/edema unchanged. No pleural effusion. Tiny left pneumothorax cannot be completely excluded. Follow-up exam suggested. Carotid vascular calcification. IMPRESSION: 1.  Lines and tubes in stable position. 2. Prior CABG. Heart size stable. Bilateral perihilar and bibasilar infiltrates/edema unchanged. 3. Tiny left apical pneumothorax cannot be completely excluded. Follow-up exam suggested. Critical Value/emergent results were called by telephone at the time of interpretation on 06/23/2018 at 6:31 am to nurse Hashm, who verbally acknowledged these results. Electronically Signed   By: Marcello Moores  Register   On: 06/23/2018 06:28   Dg Chest Port 1 View  Result Date: 06/22/2018 CLINICAL DATA:  Intubated. EXAM: PORTABLE CHEST 1 VIEW COMPARISON:  06/21/2018 FINDINGS: Endotracheal tube terminates at the carina. Enteric tube courses into the abdomen with tip not imaged. Right jugular and left subclavian catheters are unchanged. Sequelae of CABG are again identified. The cardiomediastinal silhouette is unchanged with normal heart size. Bibasilar and perihilar opacities are stable to mildly improved. No sizable pleural effusion or pneumothorax is identified. IMPRESSION: 1. Endotracheal tube at the carina. Recommend retracting 3 cm. 2. Stable to mildly improvement of bibasilar and perihilar opacities. Electronically Signed   By: Logan Bores M.D.   On: 06/22/2018 08:14   Dg Chest Port 1  View  Result Date: 06/21/2018 CLINICAL DATA:  Endotracheal tube. EXAM: PORTABLE CHEST 1 VIEW COMPARISON:  06/20/2018 FINDINGS: Endotracheal tube terminates 1 cm above the carina. Right jugular and left subclavian catheters are unchanged. Enteric tube courses into the abdomen with tip not imaged. Sequelae of CABG are again identified. The cardiac silhouette is normal in size. Lung volumes are slightly greater than on the prior study with mildly improved aeration of the lung bases. Airspace opacity persists in the medial lung bases bilaterally and in the right perihilar region. No large pleural effusion or pneumothorax is identified. IMPRESSION: 1. Endotracheal tube terminates 1 cm above the carina. Consider retracting 1-2 cm. 2. Mildly improved aeration of the lung bases. Persistent bibasilar opacities may reflect atelectasis or infection. Electronically Signed   By: Logan Bores M.D.   On: 06/21/2018 09:54   Dg Chest Port 1 View  Result Date: 06/20/2018 CLINICAL DATA:  Endotracheal tube.  Respiratory failure. EXAM: PORTABLE CHEST 1 VIEW COMPARISON:  One-view chest x-ray 06/18/2018 FINDINGS: Endotracheal tube terminates 4 cm above the carina, in satisfactory position. Left subclavian line and right IJ lines are stable. Heart is enlarged. Mild edema is present. Bibasilar opacities have increased. Small effusions are suspected. IMPRESSION: 1. Support apparatus is stable. 2. Low lung volumes with increasing  bibasilar opacities, likely atelectasis. Infection is not excluded. 3. Small effusions are suspected. Electronically Signed   By: San Morelle M.D.   On: 06/20/2018 06:34   Dg Chest Port 1 View  Result Date: 06/18/2018 CLINICAL DATA:  Central line placement EXAM: PORTABLE CHEST 1 VIEW COMPARISON:  07/02/2018. FINDINGS: The right-sided central venous catheter tip projects over the expected region of the proximal SVC. There is a new left subclavian central venous catheter with tip projecting over the  distal SVC. The endotracheal tube terminates above the carina. The patient is status post prior median sternotomy. The heart size is somewhat enlarged. There is volume overload. Multiple vascular stents are noted in the left axilla. There is no evidence of a pneumo thorax. The right costophrenic angle is not well visualized. The retrocardiac region is poorly evaluated secondary to an overlying defibrillator pad. IMPRESSION: 1. Newly placed left subclavian central venous catheter with tip projecting over the distal SVC. Stable appearance of the remaining lines and tubes. 2. No evidence of a pneumothorax, however the right costophrenic angle is not fully visualized on this exam. 3. Otherwise, stable appearance of the chest when compared to recent prior study. Electronically Signed   By: Constance Holster M.D.   On: 06/18/2018 02:01   Dg Chest Port 1 View  Result Date: 07/01/2018 CLINICAL DATA:  78 year old male central line placement. EXAM: PORTABLE CHEST 1 VIEW COMPARISON:  Portable chest earlier today. FINDINGS: Portable AP semi upright view at 2138 hours. Right IJ approach dual lumen appearing catheter with tip at the superior mediastinum, innominate vein confluence level. No pneumothorax. Stable cardiac size and mediastinal contours. Stable ET tube. Prior CABG. Improved lung volumes and bilateral perihilar ventilation with residual interstitial opacity greater on the left. No pleural effusion. Negative visible bowel gas pattern. Left axillary/subclavian vascular stents. IMPRESSION: 1. Right IJ approach dual lumen catheter placed with tip at innominate vein confluence level. 2. Stable ET tube. 3. No pneumothorax. Improved lung volumes and bilateral perihilar ventilation since 2000 hours. Electronically Signed   By: Genevie Ann M.D.   On: 06/16/2018 22:00   Dg Chest Port 1 View  Result Date: 06/16/2018 CLINICAL DATA:  Cardiopulmonary arrest.  Intubated. EXAM: PORTABLE CHEST 1 VIEW COMPARISON:  05/18/2016  FINDINGS: Previous median sternotomy and CABG. Endotracheal tube tip is 3 cm above the carina. There is an interstitial pulmonary edema pattern. No dense consolidation, collapse or effusion. Left subclavian and axillary vascular stents. IMPRESSION: Endotracheal tube well positioned.  Interstitial pulmonary edema. Electronically Signed   By: Nelson Chimes M.D.   On: 06/16/2018 20:20   Dg Abd Portable 1v  Result Date: 06/20/2018 CLINICAL DATA:  Encounter for feeding tube placement. EXAM: PORTABLE ABDOMEN - 1 VIEW COMPARISON:  Radiograph of November 20, 2006. FINDINGS: The bowel gas pattern is normal. Distal tip of feeding tube is seen in proximal stomach. No radio-opaque calculi or other significant radiographic abnormality are seen. IMPRESSION: Distal tip of feeding tube seen in proximal stomach. Electronically Signed   By: Marijo Conception M.D.   On: 06/20/2018 15:29   Dg C-arm 1-60 Min  Result Date: 06/18/2018 INDICATION: Malnutition, encounter for feeding tube placement EXAM: DG C-ARM 61-120 MIN COMPARISON:  None. CONTRAST:  None FLUOROSCOPY TIME:  10 minutes. COMPLICATIONS: None immediate PROCEDURE: The feeding tube was lubricated with viscous lidocaine inserted into the mouth. Under intermittent fluoroscopic guidance, the feeding tube was advanced through the oropharynx and into the proximal esophagus. Despite multiple unsuccessful attempts, the feeding tube was  unable to be passed into the stomach. A spot fluoroscopic image was saved for documentation purposes. The patient tolerated the procedure well without immediate postprocedural complication. FINDINGS: The feeding tube was advanced to the level of the carina. Multiple attempts to pass it beyond this level were unsuccessful. IMPRESSION: Unsuccessful fluoroscopic guided placement of feeding tube using intermittent fluoroscopic guidance. Electronically Signed   By: Kerby Moors M.D.   On: 06/18/2018 17:33     Microbiology Recent Results (from the  past 240 hour(s))  Culture, blood (routine x 2)     Status: None (Preliminary result)   Collection Time: 06/22/18 11:50 AM   Specimen: BLOOD RIGHT WRIST  Result Value Ref Range Status   Specimen Description BLOOD RIGHT WRIST  Final   Special Requests   Final    BOTTLES DRAWN AEROBIC AND ANAEROBIC Blood Culture adequate volume   Culture   Final    NO GROWTH < 12 HOURS Performed at [facility], [place] [address], [place], [place] [address]    Report Status PENDING  Incomplete  Culture, blood (routine x 2)     Status: None (Preliminary result)   Collection Time: 06/22/18 11:59 AM   Specimen: BLOOD LEFT ARM  Result Value Ref Range Status   Specimen Description BLOOD LEFT ARM  Final   Special Requests   Final    BOTTLES DRAWN AEROBIC ONLY Blood Culture results may not be optimal due to an inadequate volume of blood received in culture bottles   Culture   Final    NO GROWTH < 12 HOURS Performed at [facility], [place] [address], [place], [place] [address]    Report Status PENDING  Incomplete  Culture, respiratory (non-expectorated)     Status: None (Preliminary result)   Collection Time: 06/23/18  8:48 AM   Specimen: Tracheal Aspirate; Respiratory  Result Value Ref Range Status   Specimen Description TRACHEAL ASPIRATE  Final   Special Requests NONE  Final   Gram Stain   Final    ABUNDANT WBC PRESENT, PREDOMINANTLY PMN ABUNDANT GRAM POSITIVE COCCI IN PAIRS IN CLUSTERS    Culture   Final    ABUNDANT STAPHYLOCOCCUS AUREUS SUSCEPTIBILITIES TO FOLLOW Performed at [facility], [address] [place],  [address]    Report Status PENDING  Incomplete    Lab Basic Metabolic Panel: Recent Labs  Lab 06/20/18 0505 06/20/18 1600 06/21/18 0312  06/22/18 0317  06/23/18 0338 06/23/18 0347 06/23/18 0806 06-30-2018 0311 30-Jun-2018 0320  NA 133* 136 134*   < > 134*   < > 135 134* 134* 136 134*  K 4.5 3.9 3.7   < > 3.7   < > 4.3 4.2 4.6 6.0* 5.8*  CL 100 102 101  --   100  --  102  --  109 106  --   CO2 21* 23 22  --  22  --  16*  --   --  13*  --   GLUCOSE 226* 129* 162*  --  199*  --  152*  --  179* 135*  --   BUN 14 12 10   --  49*  --  91*  --  76* 122*  --   CREATININE 2.59* 2.18* 1.89*  --  3.24*  --  4.78*  --  4.50* 6.00*  --   CALCIUM 8.5* 8.9 9.0  --  9.1  --  9.6  --   --  10.1  --  MG 2.4  --  2.6*  --  2.5*  --  2.8*  --   --  3.0*  --   PHOS 3.9 3.4 3.4  --  2.6  --  2.7  --   --  5.6*  --    < > = values in this interval not displayed.   Liver Function Tests: Recent Labs  Lab 06/20/18 0505 06/20/18 1600 06/21/18 0312 06/23/18 0338 07-19-2018 0311  AST  --   --   --  2,700* 10,565*  ALT  --   --   --  1,908* 7,240*  ALKPHOS  --   --   --  121 162*  BILITOT  --   --   --  0.7 1.3*  PROT  --   --   --  5.4* 4.8*  ALBUMIN 2.5* 2.5* 2.4* 2.0* 1.9*   No results for input(s): LIPASE, AMYLASE in the last 168 hours. Recent Labs  Lab 06/23/18 0800  AMMONIA 141*   CBC: Recent Labs  Lab 06/20/18 0505 06/21/18 0312  06/22/18 0317  06/23/18 0338 06/23/18 0347 06/23/18 0806 07-19-2018 0311 2018/07/19 0320  WBC 17.7* 14.0*  --  9.8  --  14.2*  --   --  16.2*  --   HGB 11.8* 12.0*   < > 10.1*   < > 10.5* 10.2* 18.7* 10.1* 10.2*  HCT 37.0* 37.5*   < > 31.5*   < > 32.1* 30.0* 55.0* 32.0* 30.0*  MCV 102.5* 101.6*  --  101.9*  --  100.9*  --   --  104.2*  --   PLT 212 191  --  198  --  220  --   --  183  --    < > = values in this interval not displayed.   Cardiac Enzymes: Recent Labs  Lab 06/23/2018 2014 06/23/18 0845 06/23/18 1642 06/23/18 2000 Jul 19, 2018 0232  TROPONINI 0.52* 25.57* 29.56* 34.66* 30.20*   Sepsis Labs: Recent Labs  Lab 06/21/18 0312 06/22/18 0317 06/23/18 0338 06/23/18 0409 06/23/18 0800 July 19, 2018 0311  PROCALCITON  --   --   --   --  3.98 6.51  WBC 14.0* 9.8 14.2*  --   --  16.2*  LATICACIDVEN  --   --   --  4.2* 7.0*  --     Procedures/Operations    Orbital atherectomy based PCI of the RCA 06/19/2018 -   STENT RESOLUTE ONYX3.0X38 (3.7 mm) with PTCA of mRCA ISR; -- EF 50-55%    TTE 06/18/2018: EF now severely reduced 30 to 35% with global hypokinesis.  GRII DD.  LV apical akinesis.  RV function also moderately reduced.  CPR and intubation June 17 2018 Central line placement, dialysis catheter placement, CVVHD all initiated on June 17, 2018   [name] 2018/07/19, 8:59 AM

## 2018-07-09 NOTE — Progress Notes (Signed)
Nutrition Brief Note  Chart reviewed. Plan for terminal extubation today once family arrives.  No further nutrition interventions warranted at this time.  Please consult as needed.    Gaynell Face, MS, RD, LDN Inpatient Clinical Dietitian Pager: [phone] Weekend/After Hours: [phone]

## 2018-07-09 NOTE — Progress Notes (Addendum)
[facility] Progress Note Patient Name: [name] [name] DOB: January 02, 1941 MRN: [id number]   Date of Service  07-09-2018  HPI/Events of Note  Notified of maximum norepinephrine requirement. I was made aware of plan for terminal extubation tomorrow but not until 2 pm when family will be available.  eICU Interventions   Ordered to start vasopressin  500 cc NS bolus ordered     Intervention Category Major Interventions: Hypotension - evaluation and management  Judd Lien 07/09/18, 3:23 AM

## 2018-07-09 DEATH — deceased

## 2018-07-10 ENCOUNTER — Ambulatory Visit: Payer: Medicare Other | Admitting: Cardiology

## 2018-07-23 ENCOUNTER — Ambulatory Visit: Payer: Medicare Other | Admitting: Family Medicine

## 2018-10-30 LAB — BLOOD GAS, ARTERIAL
Acid-base deficit: 3.9 mmol/L — ABNORMAL HIGH (ref 0.0–2.0)
Bicarbonate: 20.5 mmol/L (ref 20.0–28.0)
Drawn by: 55062
FIO2: 80
MECHVT: 600 mL
O2 Saturation: 98.5 %
Patient temperature: 98.6
RATE: 16 resp/min
pCO2 arterial: 36.1 mmHg (ref 32.0–48.0)
pH, Arterial: 7.372 (ref 7.350–7.450)
pO2, Arterial: 147 mmHg — ABNORMAL HIGH (ref 83.0–108.0)

## 2020-05-30 IMAGING — CR PORTABLE ABDOMEN - 1 VIEW
1 series · 1 of 1 positions shown · non-contrast
Comparison: Radiograph November 20, 2006.

CLINICAL DATA: Encounter for feeding tube placement.

EXAM:
PORTABLE ABDOMEN - 1 VIEW

[AP]
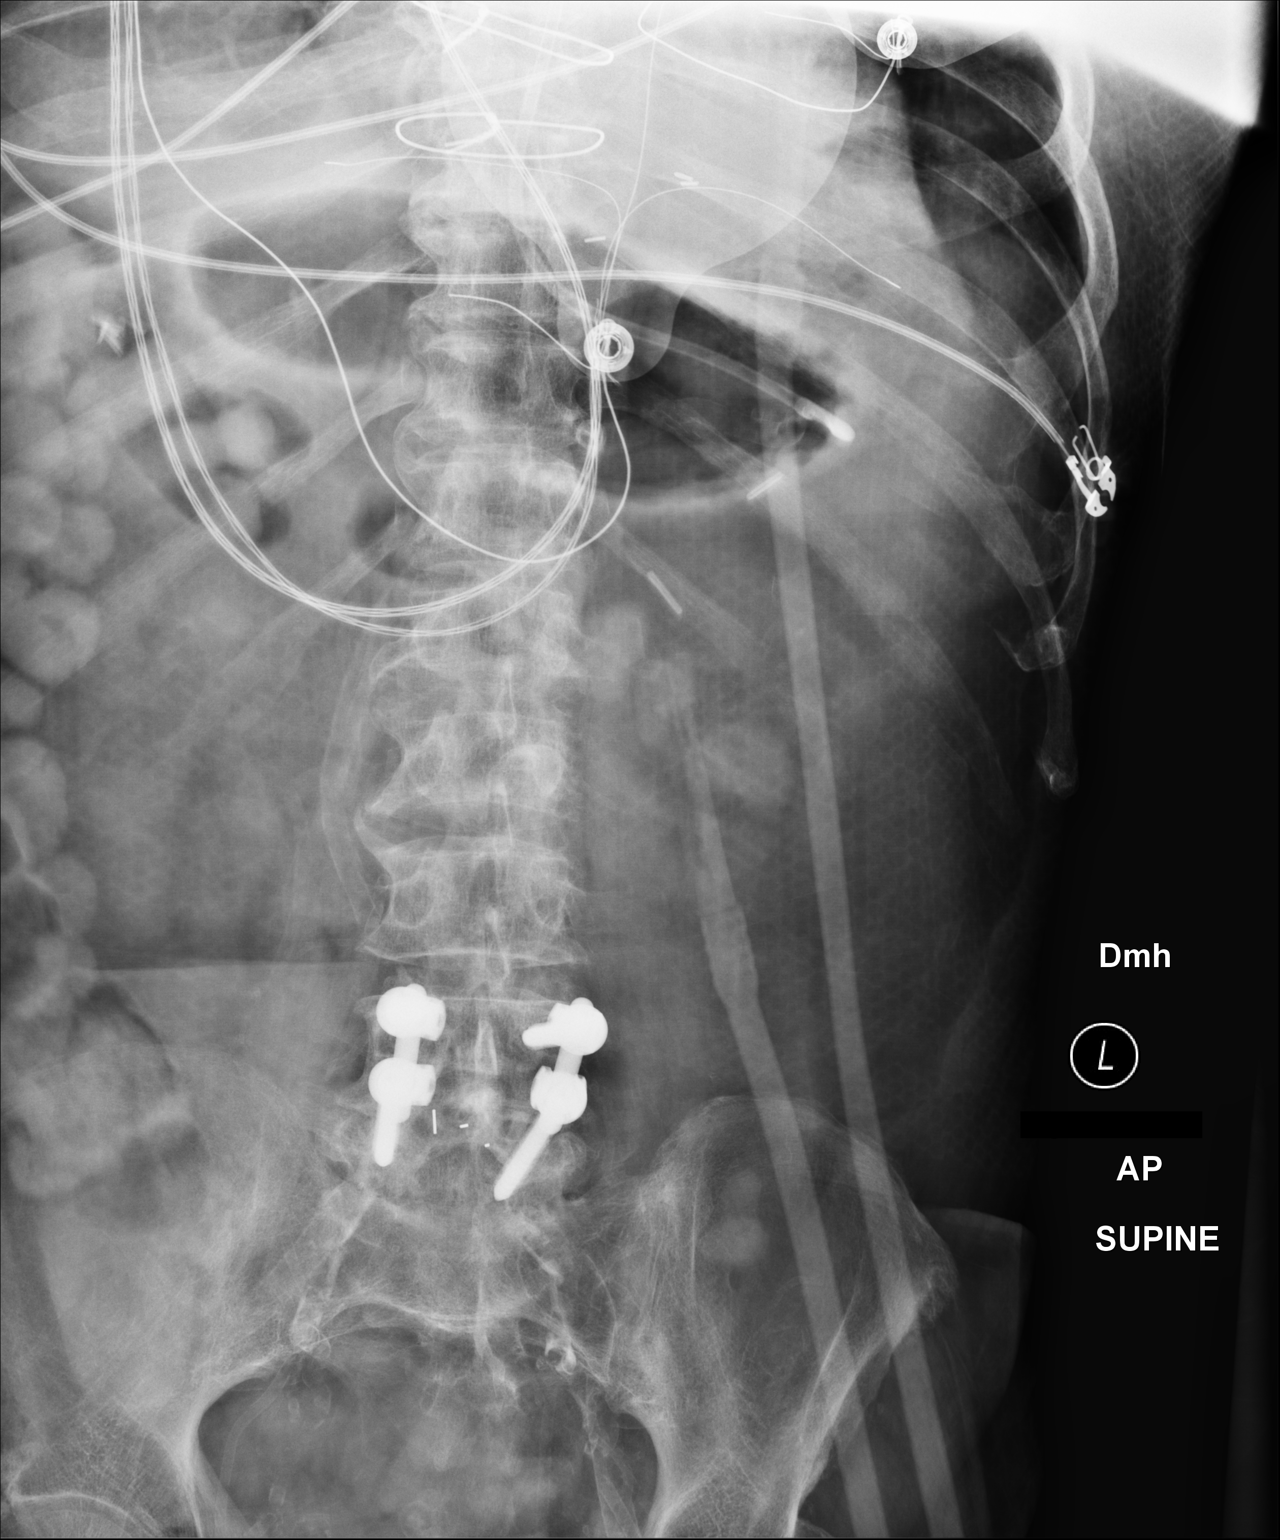

[1 of 1 positions shown; findings below may reference images not displayed]

FINDINGS: The bowel gas pattern is normal. Distal tip of feeding tube is seen
in proximal stomach. No radio-opaque calculi or other significant
radiographic abnormality are seen.
IMPRESSION: Distal tip of feeding tube seen in proximal stomach.

## 2020-05-30 IMAGING — DX PORTABLE CHEST - 1 VIEW
1 series · 1 of 1 positions shown · non-contrast
Comparison: One-view chest x-ray 06/18/2018

CLINICAL DATA: Endotracheal tube.  Respiratory failure.

EXAM:
PORTABLE CHEST 1 VIEW

[chest]
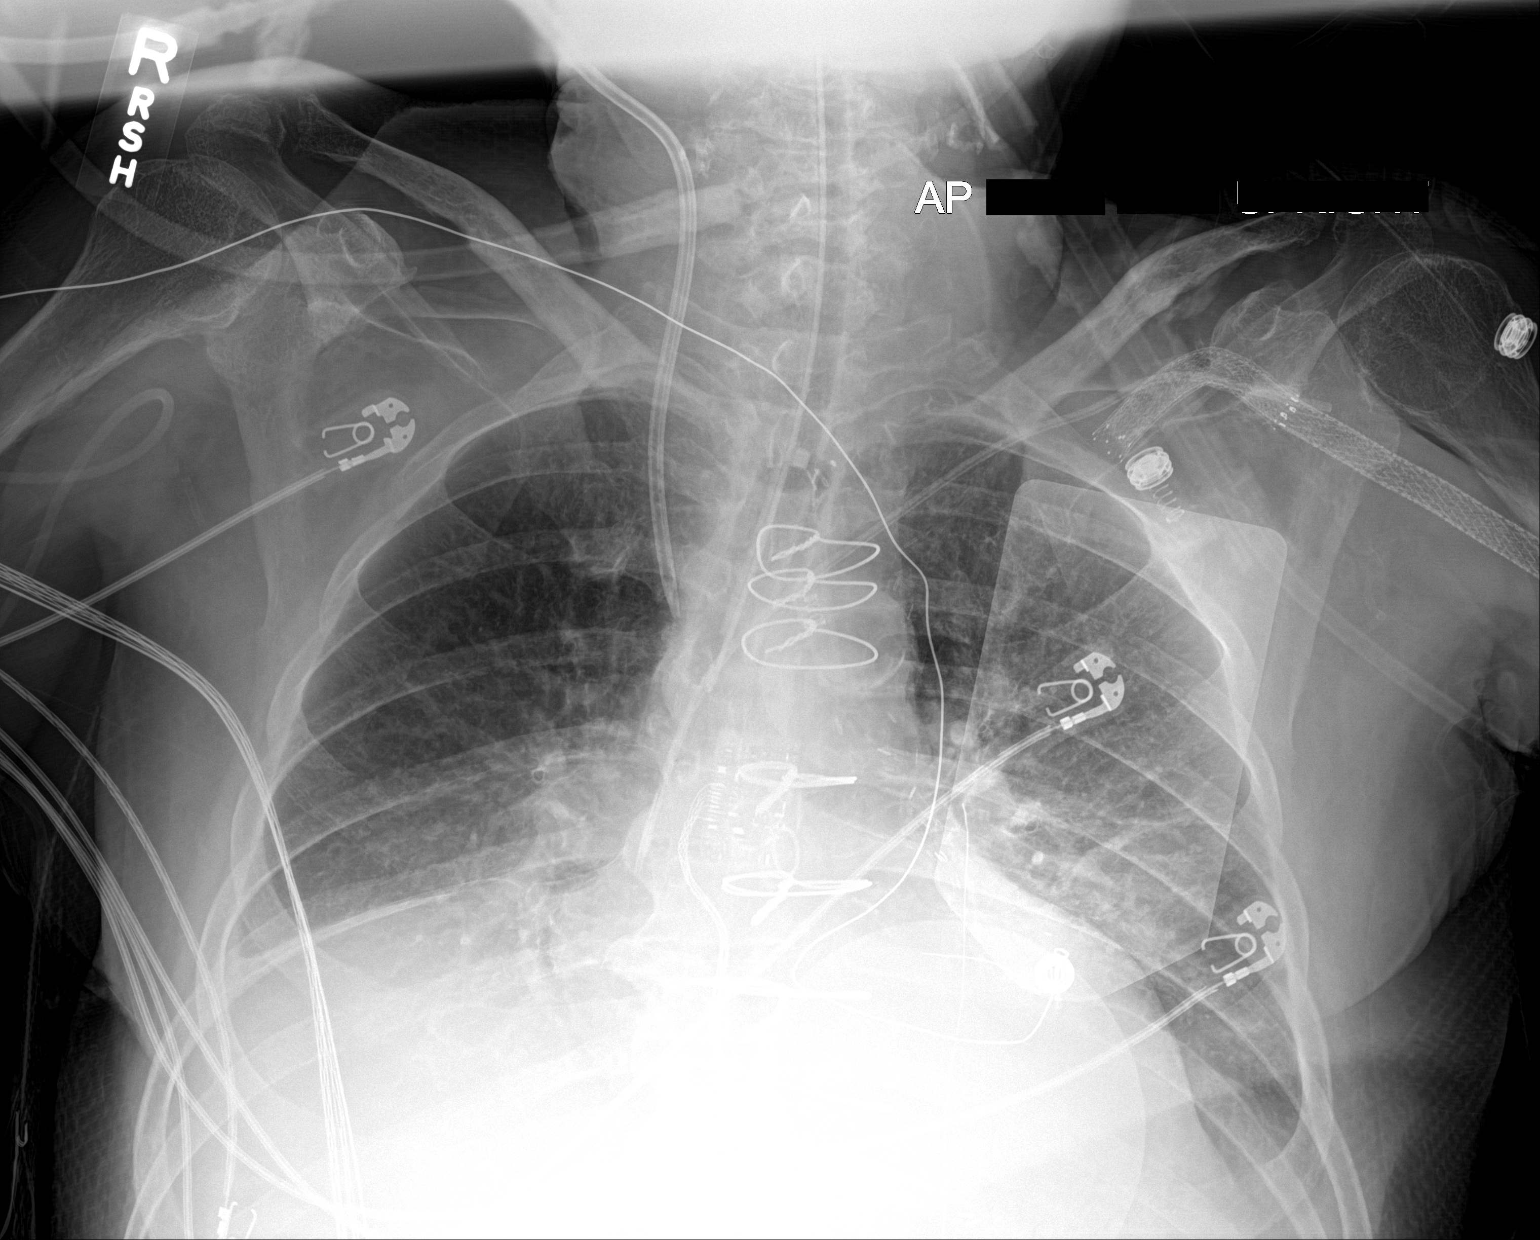

[1 of 1 positions shown; findings below may reference images not displayed]

FINDINGS: Endotracheal tube terminates 4 cm above the carina, in satisfactory
position. Left subclavian line and right IJ lines are stable. Heart
is enlarged. Mild edema is present. Bibasilar opacities have
increased. Small effusions are suspected.
IMPRESSION: 1. Support apparatus is stable.
2. Low lung volumes with increasing bibasilar opacities, likely
atelectasis. Infection is not excluded.
3. Small effusions are suspected.

## 2020-06-01 IMAGING — CR PORTABLE CHEST - 1 VIEW
1 series · 1 of 1 positions shown · non-contrast
Comparison: 06/21/2018

CLINICAL DATA: Intubated.

EXAM:
PORTABLE CHEST 1 VIEW

[AP]
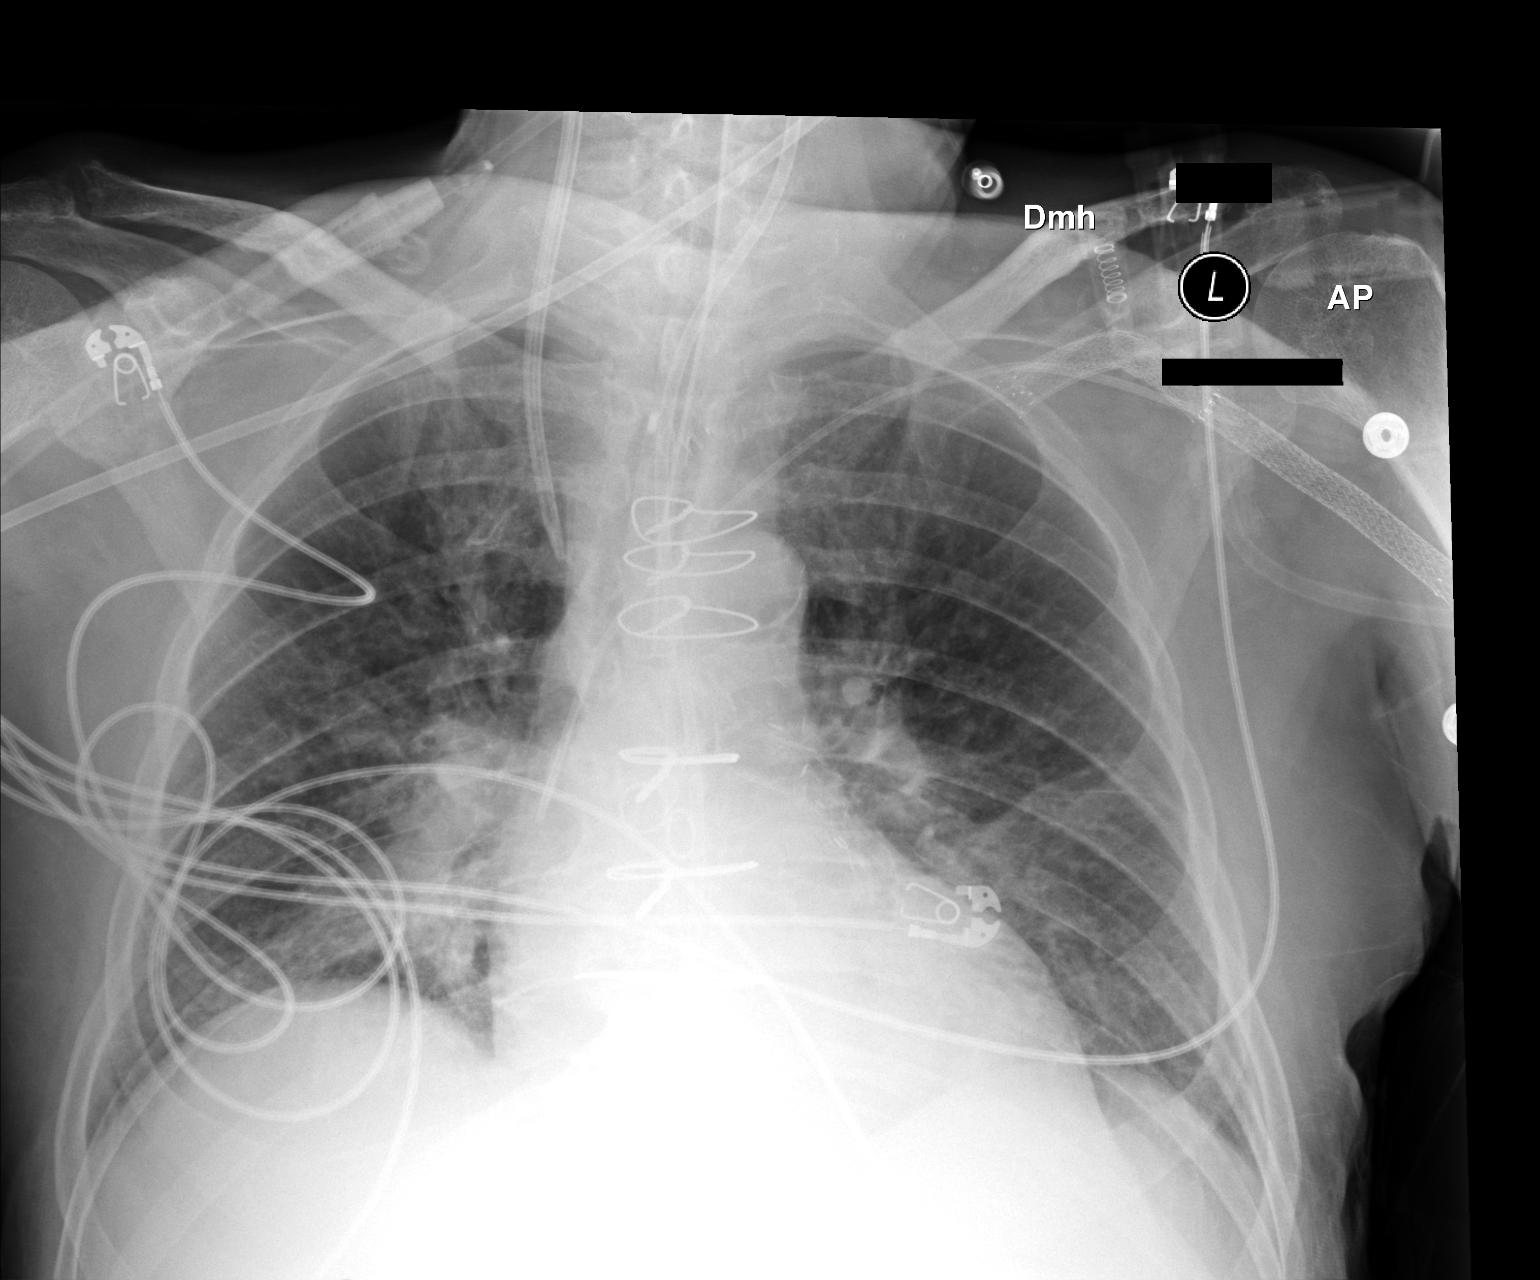

[1 of 1 positions shown; findings below may reference images not displayed]

FINDINGS: Endotracheal tube terminates at the carina. Enteric tube courses
into the abdomen with tip not imaged. Right jugular and left
subclavian catheters are unchanged. Sequelae of CABG are again
identified. The cardiomediastinal silhouette is unchanged with
normal heart size. Bibasilar and perihilar opacities are stable to
mildly improved. No sizable pleural effusion or pneumothorax is
identified.
IMPRESSION: 1. Endotracheal tube at the carina. Recommend retracting 3 cm.
2. Stable to mildly improvement of bibasilar and perihilar
opacities.

## 2020-06-02 IMAGING — DX PORTABLE CHEST - 1 VIEW
1 series · 1 of 1 positions shown · non-contrast
Comparison: Chest x-rays dated 06/23/2018 and 06/22/2018.

CLINICAL DATA: Respiratory failure. Possible LEFT apical
pneumothorax.

EXAM:
PORTABLE CHEST 1 VIEW

[chest ap]
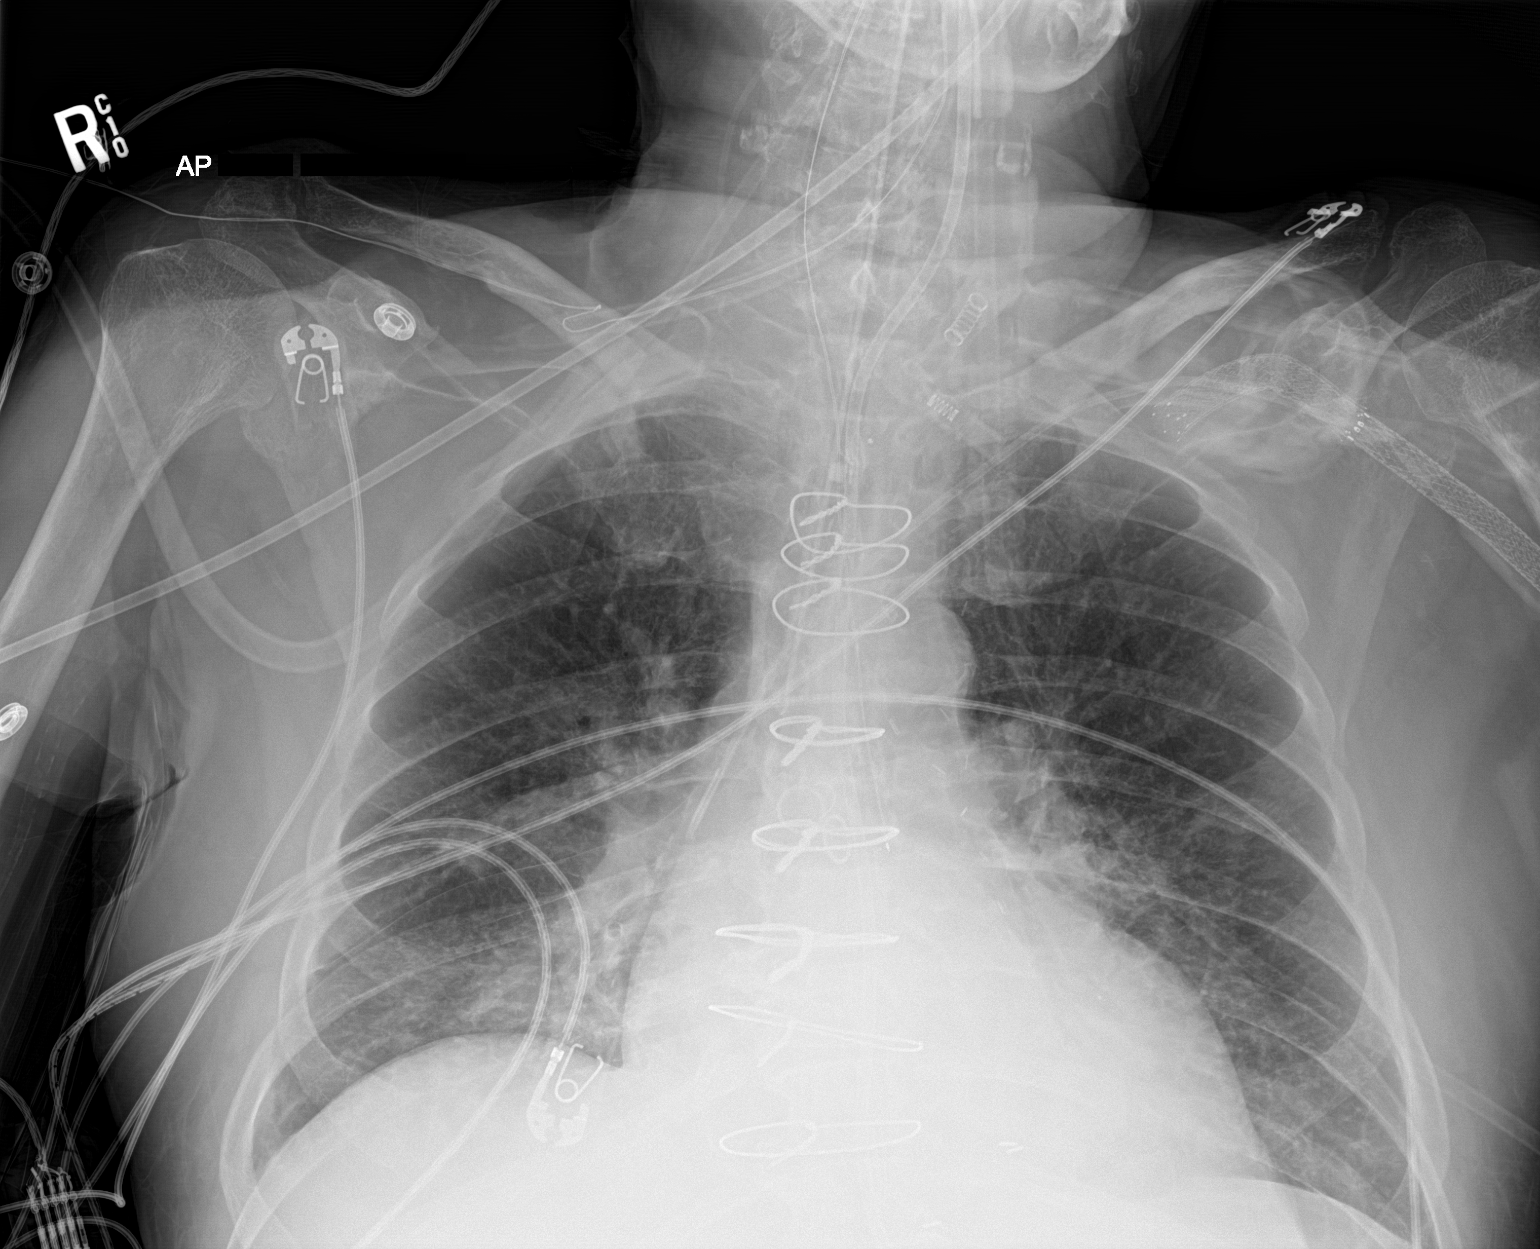

[1 of 1 positions shown; findings below may reference images not displayed]

FINDINGS: Endotracheal tube is positioned just above the level of the carina.
Enteric tube passes below the diaphragm. LEFT subclavian central
line is adequately positioned at the level of the mid SVC. Heart
size and mediastinal contours are stable. Median sternotomy wires
are stable in alignment.

Mild central pulmonary vascular congestion. No overt alveolar
pulmonary edema. No confluent opacity to suggest a developing
pneumonia. No pleural effusions seen. No pneumothorax appreciated on
the current exam.
IMPRESSION: 1. No pneumothorax is seen on the current exam.
2. Mild central pulmonary vascular congestion suggesting mild volume
overload/CHF. No overt alveolar pulmonary edema.

## 2020-06-02 IMAGING — DX ABDOMEN - 1 VIEW
1 series · 1 of 1 positions shown · non-contrast
Comparison: 06/20/2018

CLINICAL DATA: Constipation

EXAM:
ABDOMEN - 1 VIEW

[abdomen kub]
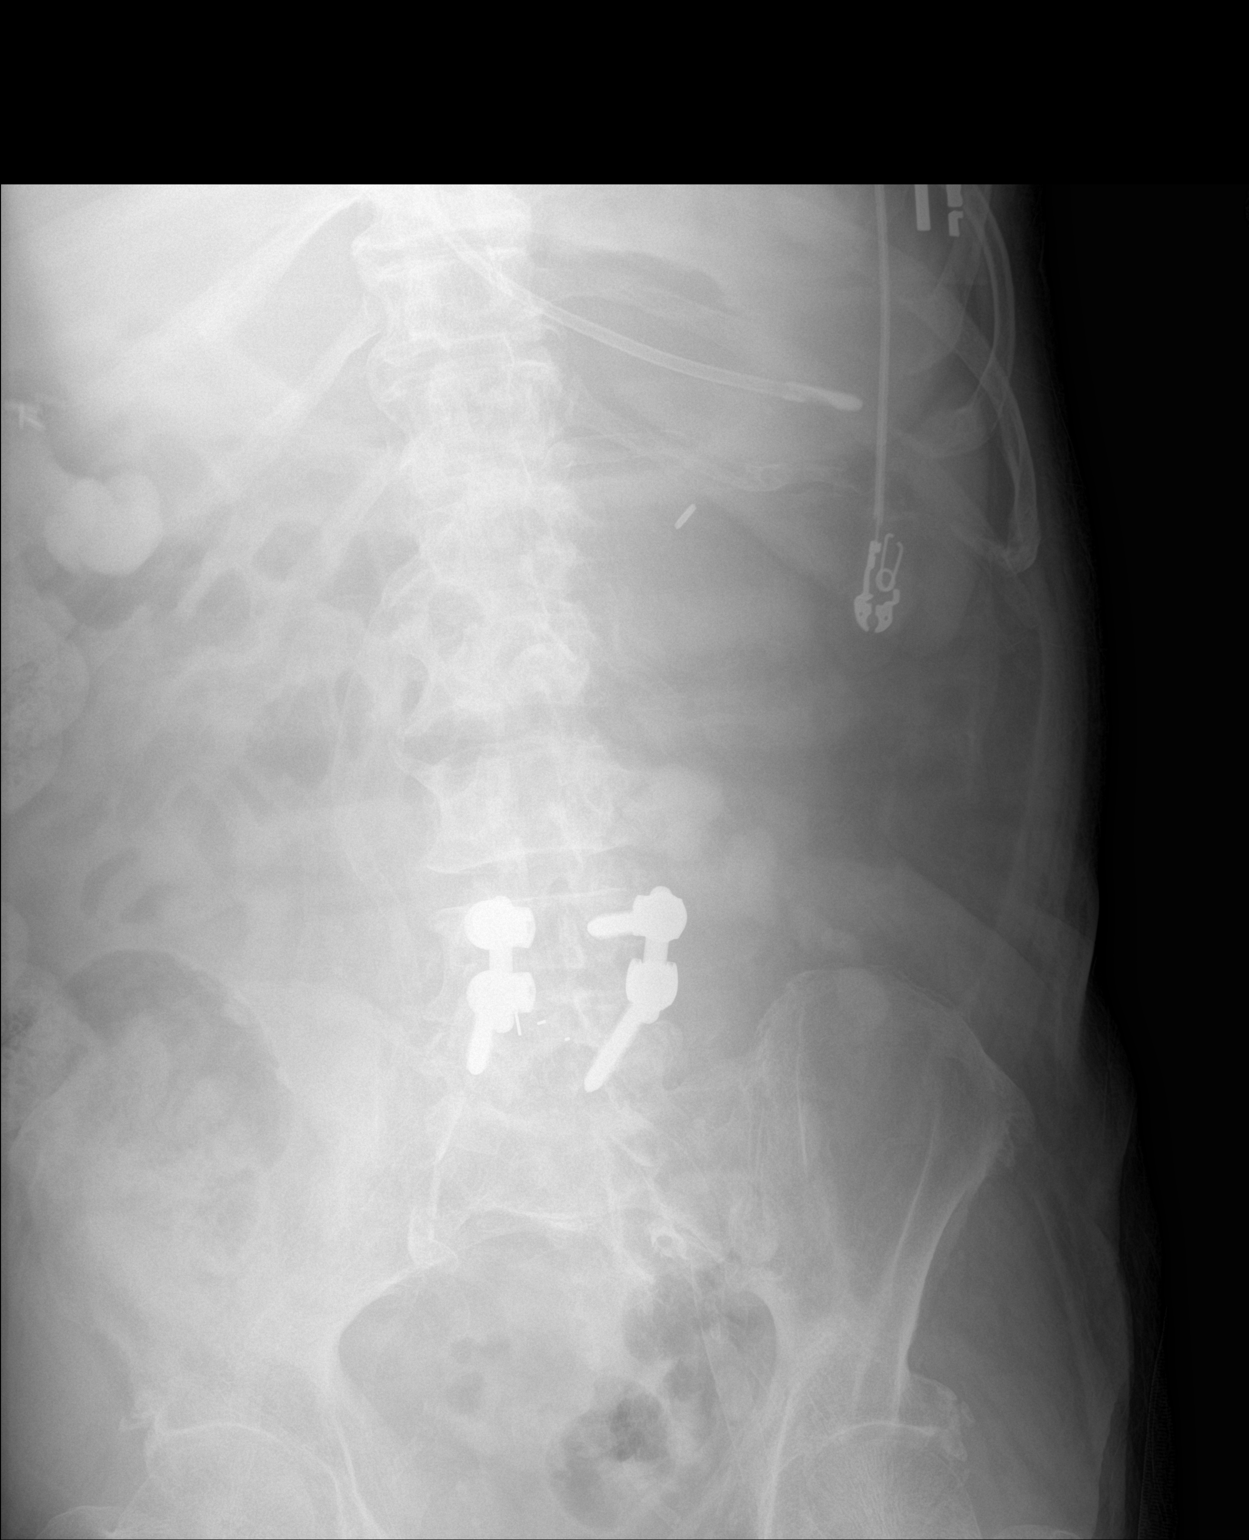

[1 of 1 positions shown; findings below may reference images not displayed]

FINDINGS: There is a moderate amount of stool in the ascending colon. There is
small amount contrast material in the ascending colon. There is a
feeding tube with the tip projecting over the stomach. There is no
bowel dilatation to suggest obstruction. There is no evidence of
pneumoperitoneum, portal venous gas or pneumatosis.

There are no pathologic calcifications along the expected course of
the ureters.

There are bilateral pedicle screws at L4-5 and L5-S1. There there is
mild lucency around the left L4 pedicle screw concerning for
loosening.
IMPRESSION: 1. Moderate amount of stool in the ascending colon.
